# Patient Record
Sex: Female | Born: 1945 | Marital: Married | Smoking: Former smoker
Health system: Southern US, Community
[De-identification: ages and names within clinical notes are randomized; demographics above are authoritative.]

## PROBLEM LIST (undated history)

## (undated) DIAGNOSIS — K219 Gastro-esophageal reflux disease without esophagitis: Secondary | ICD-10-CM

## (undated) DIAGNOSIS — G629 Polyneuropathy, unspecified: Secondary | ICD-10-CM

## (undated) DIAGNOSIS — T8859XA Other complications of anesthesia, initial encounter: Secondary | ICD-10-CM

## (undated) DIAGNOSIS — M419 Scoliosis, unspecified: Secondary | ICD-10-CM

## (undated) DIAGNOSIS — T4145XA Adverse effect of unspecified anesthetic, initial encounter: Secondary | ICD-10-CM

## (undated) DIAGNOSIS — T7840XA Allergy, unspecified, initial encounter: Secondary | ICD-10-CM

## (undated) DIAGNOSIS — R519 Headache, unspecified: Secondary | ICD-10-CM

## (undated) DIAGNOSIS — Z8719 Personal history of other diseases of the digestive system: Secondary | ICD-10-CM

## (undated) DIAGNOSIS — R238 Other skin changes: Secondary | ICD-10-CM

## (undated) DIAGNOSIS — N183 Chronic kidney disease, stage 3 unspecified: Secondary | ICD-10-CM

## (undated) DIAGNOSIS — K449 Diaphragmatic hernia without obstruction or gangrene: Secondary | ICD-10-CM

## (undated) DIAGNOSIS — R112 Nausea with vomiting, unspecified: Secondary | ICD-10-CM

## (undated) DIAGNOSIS — M069 Rheumatoid arthritis, unspecified: Secondary | ICD-10-CM

## (undated) DIAGNOSIS — K8689 Other specified diseases of pancreas: Secondary | ICD-10-CM

## (undated) DIAGNOSIS — K5090 Crohn's disease, unspecified, without complications: Secondary | ICD-10-CM

## (undated) DIAGNOSIS — D649 Anemia, unspecified: Secondary | ICD-10-CM

## (undated) DIAGNOSIS — R233 Spontaneous ecchymoses: Secondary | ICD-10-CM

## (undated) DIAGNOSIS — I10 Essential (primary) hypertension: Secondary | ICD-10-CM

## (undated) DIAGNOSIS — C44621 Squamous cell carcinoma of skin of unspecified upper limb, including shoulder: Secondary | ICD-10-CM

## (undated) DIAGNOSIS — Z9889 Other specified postprocedural states: Secondary | ICD-10-CM

## (undated) DIAGNOSIS — A3791 Whooping cough, unspecified species with pneumonia: Secondary | ICD-10-CM

## (undated) DIAGNOSIS — M1990 Unspecified osteoarthritis, unspecified site: Secondary | ICD-10-CM

## (undated) DIAGNOSIS — J342 Deviated nasal septum: Secondary | ICD-10-CM

## (undated) HISTORY — PX: COLONOSCOPY: SHX174

## (undated) HISTORY — PX: OTHER SURGICAL HISTORY: SHX169

## (undated) HISTORY — PX: UPPER GASTROINTESTINAL ENDOSCOPY: SHX188

## (undated) HISTORY — PX: TONSILLECTOMY: SUR1361

## (undated) HISTORY — PX: NASAL FRACTURE SURGERY: SHX718

## (undated) HISTORY — PX: HERNIA REPAIR: SHX51

## (undated) HISTORY — DX: Chronic kidney disease, stage 3 unspecified: N18.30

---
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
## 2013-01-07 DIAGNOSIS — A3790 Whooping cough, unspecified species without pneumonia: Secondary | ICD-10-CM

## 2013-01-07 HISTORY — DX: Whooping cough, unspecified species without pneumonia: A37.90

## 2016-01-08 HISTORY — PX: CATARACT EXTRACTION, BILATERAL: SHX1313

## 2018-02-11 ENCOUNTER — Encounter: Payer: Self-pay | Admitting: Internal Medicine

## 2018-04-08 NOTE — H&P (Signed)
TOTAL KNEE ADMISSION H&P  Patient is being admitted for right total knee arthroplasty.  Subjective:  Chief Complaint:  Right knee primary OA / pain  HPI: [employer], 73 y.o. female, has a history of pain and functional disability in the right knee due to arthritis and has failed non-surgical conservative treatments for greater than 12 weeks to include NSAID's and/or analgesics, corticosteriod injections, viscosupplementation injections and activity modification.  Onset of symptoms was gradual, starting >10 years ago with gradually worsening course since that time. The patient noted no past surgery on the right knee(s).  Patient currently rates pain in the right knee(s) at 10 out of 10 with activity. Patient has night pain, worsening of pain with activity and weight bearing, pain that interferes with activities of daily living, pain with passive range of motion, crepitus and joint swelling.  Patient has evidence of periarticular osteophytes and joint space narrowing by imaging studies.  There is no active infection.  Risks, benefits and expectations were discussed with the patient.  Risks including but not limited to the risk of anesthesia, blood clots, nerve damage, blood vessel damage, failure of the prosthesis, infection and up to and including death.  Patient understand the risks, benefits and expectations and wishes to proceed with surgery.   PCP: Patient, No Pcp Per  D/C Plans:       Home (daughter's house)  Post-op Meds:       No Rx given  Tranexamic Acid:      To be given - IV   Decadron:      Is to be given  FYI:     Xarelto (unable to use ASA, Crohns)  APAP with prn Dilaudid (trial) (tramadol and codeine cause sign N&V)   DME:   Pt already has equipment  PT:   OPPT   [facility] [place]  There are no active problems to display for this patient.  No past medical history on file.    No current facility-administered medications for this encounter.    Current  Outpatient Medications  Medication Sig Dispense Refill Last Dose  . acetaminophen (TYLENOL) 650 MG CR tablet Take 650 mg by mouth every 8 (eight) hours as needed for pain.     Marland Kitchen amoxicillin (AMOXIL) 500 MG capsule Take 500 mg by mouth 2 (two) times daily as needed (Chrone's).     . calcium carbonate (OSCAL) 1500 (600 Ca) MG TABS tablet Take 600 mg of elemental calcium by mouth 2 (two) times daily with a meal.     . cetirizine (ZYRTEC) 10 MG tablet Take 10 mg by mouth daily.     . Cholecalciferol (VITAMIN D) 50 MCG (2000 UT) CAPS Take 2,000 Units by mouth daily.     Marland Kitchen dicyclomine (BENTYL) 20 MG tablet Take 20 mg by mouth 4 (four) times daily as needed for spasms.     . DULoxetine (CYMBALTA) 60 MG capsule Take 60 mg by mouth 2 (two) times daily.     . hydroxychloroquine (PLAQUENIL) 200 MG tablet Take 400 mg by mouth daily.     Marland Kitchen losartan (COZAAR) 50 MG tablet Take 50 mg by mouth daily.     . mesalamine (LIALDA) 1.2 g EC tablet Take 4.8 g by mouth daily with breakfast.     . Multiple Vitamin (MULTIVITAMIN WITH MINERALS) TABS tablet Take 1 tablet by mouth daily.     Marland Kitchen omeprazole (PRILOSEC) 40 MG capsule Take 40 mg by mouth 2 (two) times daily.     Marland Kitchen  rOPINIRole (REQUIP) 2 MG tablet Take 4 mg by mouth at bedtime.      Allergies  Allergen Reactions  . Aspirin     Intestinal Bleeding  . Codeine Nausea And Vomiting  . Latex     Redness and skin peeling  . Septra [Sulfamethoxazole-Trimethoprim] Nausea Only    Social History   Tobacco Use  . Smoking status: Not on file  Substance Use Topics  . Alcohol use: Not on file       Review of Systems  Constitutional: Negative.   HENT: Positive for hearing loss.   Eyes: Negative.   Respiratory: Negative.   Cardiovascular: Negative.   Gastrointestinal: Positive for heartburn.  Genitourinary: Negative.   Musculoskeletal: Positive for joint pain.  Skin: Negative.   Neurological: Negative.   Endo/Heme/Allergies: Positive for environmental  allergies.  Psychiatric/Behavioral: Negative.     Objective:  Physical Exam  Constitutional: She is oriented to person, place, and time. She appears well-developed.  HENT:  Head: Normocephalic.  Eyes: Pupils are equal, round, and reactive to light.  Neck: Neck supple. No JVD present. No tracheal deviation present. No thyromegaly present.  Cardiovascular: Normal rate, regular rhythm and intact distal pulses.  Respiratory: Effort normal and breath sounds normal. No respiratory distress. She has no wheezes.  GI: Soft. There is no abdominal tenderness. There is no guarding.  Musculoskeletal:     Right knee: She exhibits decreased range of motion, swelling, abnormal alignment and bony tenderness. She exhibits no ecchymosis, no deformity, no laceration and no erythema. Tenderness found.  Lymphadenopathy:    She has no cervical adenopathy.  Neurological: She is alert and oriented to person, place, and time.  Skin: Skin is warm and dry.  Psychiatric: She has a normal mood and affect.      Imaging Review Plain radiographs demonstrate severe degenerative joint disease of the right knee(s). The overall alignment issignificant valgus. The bone quality appears to be good for age and reported activity level.      Assessment/Plan:  End stage arthritis, right knee   The patient history, physical examination, clinical judgment of the provider and imaging studies are consistent with end stage degenerative joint disease of the right knee(s) and total knee arthroplasty is deemed medically necessary. The treatment options including medical management, injection therapy arthroscopy and arthroplasty were discussed at length. The risks and benefits of total knee arthroplasty were presented and reviewed. The risks due to aseptic loosening, infection, stiffness, patella tracking problems, thromboembolic complications and other imponderables were discussed. The patient acknowledged the explanation, agreed  to proceed with the plan and consent was signed. Patient is being admitted for inpatient treatment for surgery, pain control, PT, OT, prophylactic antibiotics, VTE prophylaxis, progressive ambulation and ADL's and discharge planning. The patient is planning to be discharged home.     Patient's anticipated LOS is less than 2 midnights, meeting these requirements: - Lives within 1 hour of care - Has a competent adult at home to recover with post-op recover - NO history of  - Chronic pain requiring opiods  - Diabetes  - Coronary Artery Disease  - Heart failure  - Heart attack  - Stroke  - DVT/VTE  - Cardiac arrhythmia  - Respiratory Failure/COPD  - Renal failure  - Anemia  - Advanced Liver disease    West Pugh. Mardelle Pandolfi   PA-C  04/13/2018, 9:06 PM

## 2018-04-23 ENCOUNTER — Encounter (HOSPITAL_COMMUNITY): Payer: Medicare Other

## 2018-04-28 ENCOUNTER — Encounter (HOSPITAL_COMMUNITY): Admission: RE | Payer: Self-pay | Source: Home / Self Care

## 2018-04-28 ENCOUNTER — Inpatient Hospital Stay (HOSPITAL_COMMUNITY): Admission: RE | Admit: 2018-04-28 | Payer: Medicare Other | Source: Home / Self Care | Admitting: Orthopedic Surgery

## 2018-04-28 SURGERY — ARTHROPLASTY, KNEE, TOTAL
Anesthesia: Spinal | Laterality: Right

## 2018-06-10 NOTE — H&P (Signed)
TOTAL KNEE ADMISSION H&P  Patient is being admitted for right total knee arthroplasty.  Subjective:  Chief Complaint:  Right knee primary OA / pain  HPI: [employer], 73 y.o. female, has a history of pain and functional disability in the right knee due to arthritis and has failed non-surgical conservative treatments for greater than 12 weeks to includeNSAID's and/or analgesics, corticosteriod injections, viscosupplementation injections and activity modification.  Onset of symptoms was gradual, starting >10 years ago with gradually worsening course since that time. The patient noted no past surgery on the right knee(s).  Patient currently rates pain in the right knee(s) at 7 out of 10 with activity. Patient has night pain, worsening of pain with activity and weight bearing, pain that interferes with activities of daily living, pain with passive range of motion, crepitus and joint swelling.  Patient has evidence of periarticular osteophytes and joint space narrowing by imaging studies. There is no active infection.  Risks, benefits and expectations were discussed with the patient.  Risks including but not limited to the risk of anesthesia, blood clots, nerve damage, blood vessel damage, failure of the prosthesis, infection and up to and including death.  Patient understand the risks, benefits and expectations and wishes to proceed with surgery.   PCP: Patient, No Pcp Per  D/C Plans:       Home (daughter's house)  Post-op Meds:       No Rx given  Tranexamic Acid:      To be given - IV   Decadron:      Is to be given  FYI:      Xarelto (unable to tolerate ASA due to Crohn's dz)  Norco  DME:   Rx given for - RW & 3-n-1  PT:   OPPT   [facility]  --  [address]   Past Medical History:  Diagnosis Date  . Allergies   . Asthma   . Bruises easily   . Crohn's disease ([place])    GI Dr Beulah Gandy in [place], [place]   . Deviated septum   . GERD (gastroesophageal reflux disease)    . Headache    occ   . Hiatal hernia   . HTN (hypertension)    pcp Dr Margaretann Loveless  in [place], [place]   . Neuropathy    fingers,periodically   . Osteoarthritis   . Pancreatic insufficiency   . Rheumatoid arthritis ([place])   . Scoliosis   . Squamous cell carcinoma of arm    left   . Whooping cough with pneumonia    2017    Past Surgical History:  Procedure Laterality Date  . CATARACT EXTRACTION, BILATERAL  2018   with IOC implant   . COLONOSCOPY    . knee fracture     . NASAL FRACTURE SURGERY     needed b/c she was getting frequent sinus infections   . UPPER GASTROINTESTINAL ENDOSCOPY      No current facility-administered medications for this encounter.    Current Outpatient Medications  Medication Sig Dispense Refill Last Dose  . acetaminophen (TYLENOL) 650 MG CR tablet Take 1,300 mg by mouth every 8 (eight) hours as needed for pain.      Marland Kitchen amoxicillin (AMOXIL) 500 MG capsule Take 500 mg by mouth 2 (two) times daily as needed (Chrone's flare up).      . calcium carbonate (OSCAL) 1500 (600 Ca) MG TABS tablet Take 600 mg of elemental calcium by mouth 2 (two) times daily  with a meal.     . cetirizine (ZYRTEC) 10 MG tablet Take 10 mg by mouth daily.     . Cholecalciferol (VITAMIN D) 50 MCG (2000 UT) CAPS Take 2,000 Units by mouth daily.     . diclofenac sodium (VOLTAREN) 1 % GEL Apply 1 application topically 2 (two) times a day.     . dicyclomine (BENTYL) 20 MG tablet Take 20 mg by mouth 4 (four) times daily as needed for spasms.     . DULoxetine (CYMBALTA) 60 MG capsule Take 60 mg by mouth 2 (two) times daily.     . hydroxychloroquine (PLAQUENIL) 200 MG tablet Take 400 mg by mouth daily.     Marland Kitchen loperamide (IMODIUM) 2 MG capsule Take 2 mg by mouth 3 (three) times daily as needed for diarrhea or loose stools.     Marland Kitchen losartan (COZAAR) 50 MG tablet Take 50 mg by mouth daily.     . mesalamine (LIALDA) 1.2 g EC tablet Take 4.8 g by mouth daily with breakfast.     . Multiple  Vitamin (MULTIVITAMIN WITH MINERALS) TABS tablet Take 1 tablet by mouth daily.     Marland Kitchen omeprazole (PRILOSEC) 40 MG capsule Take 40 mg by mouth 2 (two) times daily.     . ondansetron (ZOFRAN) 4 MG tablet Take 4 mg by mouth every 8 (eight) hours as needed for nausea or vomiting.     Marland Kitchen rOPINIRole (REQUIP) 2 MG tablet Take 4 mg by mouth at bedtime.     . lipase/protease/amylase (CREON) 12000 units CPEP capsule Take 24,000 Units by mouth 3 (three) times daily with meals.      Allergies  Allergen Reactions  . Aspirin     Intestinal Bleeding  . Codeine Nausea And Vomiting  . Demerol [Meperidine Hcl] Nausea And Vomiting  . Lactose Intolerance (Gi) Other (See Comments)    Pt has Crohn's   . Latex     Redness and skin peeling  . Septra [Sulfamethoxazole-Trimethoprim] Nausea Only    Social History   Tobacco Use  . Smoking status: Former Smoker    Years: 30.00    Types: Cigarettes    Quit date: 1990    Years since quitting: 30.5  . Smokeless tobacco: Never Used  Substance Use Topics  . Alcohol use: Not Currently       Review of Systems  Constitutional: Negative.   HENT: Negative.   Eyes: Negative.   Respiratory: Negative.   Cardiovascular: Negative.   Gastrointestinal: Positive for abdominal pain (CROHN'S) and heartburn.  Genitourinary: Negative.   Musculoskeletal: Positive for joint pain.  Skin: Negative.   Neurological: Positive for headaches.  Endo/Heme/Allergies: Positive for environmental allergies.  Psychiatric/Behavioral: Negative.     Objective:  Physical Exam  Constitutional: She is oriented to person, place, and time. She appears well-developed.  HENT:  Head: Normocephalic.  Eyes: Pupils are equal, round, and reactive to light.  Neck: Neck supple. No JVD present. No tracheal deviation present. No thyromegaly present.  Cardiovascular: Normal rate, regular rhythm and intact distal pulses.  Murmur heard. Respiratory: Effort normal and breath sounds normal. No  respiratory distress. She has no wheezes.  GI: Soft. There is no abdominal tenderness. There is no guarding.  Musculoskeletal:     Right knee: She exhibits decreased range of motion, swelling and bony tenderness. She exhibits no ecchymosis, no deformity, no laceration and no erythema. Tenderness found.  Lymphadenopathy:    She has no cervical adenopathy.  Neurological: She is alert and  oriented to person, place, and time.  Skin: Skin is warm and dry.  Psychiatric: She has a normal mood and affect.       Imaging Review Plain radiographs demonstrate severe degenerative joint disease of the right knee(s). The overall alignment is valgus. The bone quality appears to be good for age and reported activity level.      Assessment/Plan:  End stage arthritis, right knee   The patient history, physical examination, clinical judgment of the provider and imaging studies are consistent with end stage degenerative joint disease of the right knee(s) and total knee arthroplasty is deemed medically necessary. The treatment options including medical management, injection therapy arthroscopy and arthroplasty were discussed at length. The risks and benefits of total knee arthroplasty were presented and reviewed. The risks due to aseptic loosening, infection, stiffness, patella tracking problems, thromboembolic complications and other imponderables were discussed. The patient acknowledged the explanation, agreed to proceed with the plan and consent was signed. Patient is being admitted for inpatient treatment for surgery, pain control, PT, OT, prophylactic antibiotics, VTE prophylaxis, progressive ambulation and ADL's and discharge planning. The patient is planning to be discharged home.     Patient's anticipated LOS is less than 2 midnights, meeting these requirements: - Lives within 1 hour of care - Has a competent adult at home to recover with post-op recover - NO history of  - Chronic pain requiring  opiods  - Diabetes  - Coronary Artery Disease  - Heart failure  - Heart attack  - Stroke  - DVT/VTE  - Cardiac arrhythmia  - Respiratory Failure/COPD  - Renal failure  - Anemia  - Advanced Liver disease          West Pugh. Querida Beretta   PA-C  07/27/2018, 10:16 AM

## 2018-07-03 ENCOUNTER — Encounter (HOSPITAL_COMMUNITY): Admission: RE | Admit: 2018-07-03 | Payer: Medicare Other | Source: Ambulatory Visit

## 2018-07-22 ENCOUNTER — Other Ambulatory Visit (HOSPITAL_COMMUNITY): Payer: Self-pay | Admitting: *Deleted

## 2018-07-22 NOTE — Patient Instructions (Addendum)
YOU NEED TO HAVE A COVID 19 TEST ON_______ Friday, July 17______, THIS TEST MUST BE DONE BEFORE SURGERY, COME TO [place] ENTRANCE. ONCE YOUR COVID TEST IS COMPLETED, PLEASE BEGIN THE QUARANTINE INSTRUCTIONS AS OUTLINED IN YOUR HANDOUT.                [name]    Your procedure is scheduled on: 07-28-2018  Report to [facility] Main  Entrance    Report to admitting at 1025 AM      Call this number if you have problems the morning of surgery [phone]    Remember: [place], NO [place].   Do not eat food After Midnight. YOU MAY HAVE CLEAR LIQUIDS FROM MIDNIGHT UNTIL 9:55AM. At 9:55AM Please finish the prescribed Pre-Surgery ENSURE drink. Nothing by mouth after you finish the ENSURE drink !    CLEAR LIQUID DIET   Foods Allowed                                                                     Foods Excluded  Coffee and tea, regular and decaf                             liquids that you cannot  Plain Jell-O any favor except red or purple                                           see through such as: Fruit ices (not with fruit pulp)                                     milk, soups, orange juice  Iced Popsicles                                    All solid food Carbonated beverages, regular and diet                                    Cranberry, grape and apple juices Sports drinks like Gatorade Lightly seasoned clear broth or consume(fat free) Sugar, honey syrup  Sample Menu Breakfast                                Lunch                                     Supper Cranberry juice                    Beef broth  Chicken broth Jell-O                                     Grape juice                           Apple juice Coffee or tea                        Jell-O                                      Popsicle        Coffee or tea                        Coffee or tea  _____________________________________________________________________     Take these medicines the morning of surgery with A SIP OF WATER: DULOXETINE, HYDROXYCHLOROQUINE, CETRIZINE, TYLENOL IF NEEDED                                You may not have any metal on your body including hair pins and              piercings  Do not wear jewelry, make-up, lotions, powders or perfumes, deodorant             Do not wear nail polish.  Do not shave  48 hours prior to surgery.                 Do not bring valuables to the hospital. [place].  Contacts, dentures or bridgework may not be worn into surgery.  Leave suitcase in the car. After surgery it may be brought to your room.     _____________________________________________________________________             [facility] - Preparing for Surgery Before surgery, you can play an important role.  Because skin is not sterile, your skin needs to be as free of germs as possible.  You can reduce the number of germs on your skin by washing with CHG (chlorahexidine gluconate) soap before surgery.  CHG is an antiseptic cleaner which kills germs and bonds with the skin to continue killing germs even after washing. Please DO NOT use if you have an allergy to CHG or antibacterial soaps.  If your skin becomes reddened/irritated stop using the CHG and inform your nurse when you arrive at Short Stay. Do not shave (including legs and underarms) for at least 48 hours prior to the first CHG shower.  You may shave your face/neck. Please follow these instructions carefully:  1.  Shower with CHG Soap the night before surgery and the  morning of Surgery.  2.  If you choose to wash your hair, wash your hair first as usual with your  normal  shampoo.  3.  After you shampoo, rinse your hair and body thoroughly to remove the  shampoo.                           4.  Use  CHG as  you would any other liquid soap.  You can apply chg directly  to the skin and wash                       Gently with a scrungie or clean washcloth.  5.  Apply the CHG Soap to your body ONLY FROM THE NECK DOWN.   Do not use on face/ open                           Wound or open sores. Avoid contact with eyes, ears mouth and genitals (private parts).                       Wash face,  Genitals (private parts) with your normal soap.             6.  Wash thoroughly, paying special attention to the area where your surgery  will be performed.  7.  Thoroughly rinse your body with warm water from the neck down.  8.  DO NOT shower/wash with your normal soap after using and rinsing off  the CHG Soap.                9.  Pat yourself dry with a clean towel.            10.  Wear clean pajamas.            11.  Place clean sheets on your bed the night of your first shower and do not  sleep with pets. Day of Surgery : Do not apply any lotions/deodorants the morning of surgery.  Please wear clean clothes to the hospital/surgery center.  FAILURE TO FOLLOW THESE INSTRUCTIONS MAY RESULT IN THE CANCELLATION OF YOUR SURGERY PATIENT SIGNATURE_________________________________  NURSE SIGNATURE__________________________________  ________________________________________________________________________   [name]  An incentive spirometer is a tool that can help keep your lungs clear and active. This tool measures how well you are filling your lungs with each breath. Taking long deep breaths may help reverse or decrease the chance of developing breathing (pulmonary) problems (especially infection) following:  A long period of time when you are unable to move or be active. BEFORE THE PROCEDURE   If the spirometer includes an indicator to show your best effort, your nurse or respiratory therapist will set it to a desired goal.  If possible, sit up straight or lean slightly forward. Try not to  slouch.  Hold the incentive spirometer in an upright position. INSTRUCTIONS FOR USE  1. Sit on the edge of your bed if possible, or sit up as far as you can in bed or on a chair. 2. Hold the incentive spirometer in an upright position. 3. Breathe out normally. 4. Place the mouthpiece in your mouth and seal your lips tightly around it. 5. Breathe in slowly and as deeply as possible, raising the piston or the ball toward the top of the column. 6. Hold your breath for 3-5 seconds or for as long as possible. Allow the piston or ball to fall to the bottom of the column. 7. Remove the mouthpiece from your mouth and breathe out normally. 8. Rest for a few seconds and repeat Steps 1 through 7 at least 10 times every 1-2 hours when you are awake. Take your time and take a few normal breaths between deep breaths. 9. The spirometer may include an indicator to show your best effort. Use the  indicator as a goal to work toward during each repetition. 10. After each set of 10 deep breaths, practice coughing to be sure your lungs are clear. If you have an incision (the cut made at the time of surgery), support your incision when coughing by placing a pillow or rolled up towels firmly against it. Once you are able to get out of bed, walk around indoors and cough well. You may stop using the incentive spirometer when instructed by your caregiver.  RISKS AND COMPLICATIONS  Take your time so you do not get dizzy or light-headed.  If you are in pain, you may need to take or ask for pain medication before doing incentive spirometry. It is harder to take a deep breath if you are having pain. AFTER USE  Rest and breathe slowly and easily.  It can be helpful to keep track of a log of your progress. Your caregiver can provide you with a simple table to help with this. If you are using the spirometer at home, follow these instructions: [place] IF:   You are having difficultly using the spirometer.  You  have trouble using the spirometer as often as instructed.  Your pain medication is not giving enough relief while using the spirometer.  You develop fever of 100.5 F (38.1 C) or higher. SEEK IMMEDIATE MEDICAL CARE IF:   You cough up bloody sputum that had not been present before.  You develop fever of 102 F (38.9 C) or greater.  You develop worsening pain at or near the incision site. MAKE SURE YOU:   Understand these instructions.  Will watch your condition.  Will get help right away if you are not doing well or get worse. Document Released: 05/06/2006 Document Revised: 03/18/2011 Document Reviewed: 07/07/2006 [employer] Patient Information 2014 [employer], [place].   ________________________________________________________________________  WHAT IS A BLOOD TRANSFUSION? Blood Transfusion Information  A transfusion is the replacement of blood or some of its parts. Blood is made up of multiple cells which provide different functions.  Red blood cells carry oxygen and are used for blood loss replacement.  White blood cells fight against infection.  Platelets control bleeding.  Plasma helps clot blood.  Other blood products are available for specialized needs, such as hemophilia or other clotting disorders. BEFORE THE TRANSFUSION  Who gives blood for transfusions?   Healthy volunteers who are fully evaluated to make sure their blood is safe. This is blood bank blood. Transfusion therapy is the safest it has ever been in the practice of medicine. Before blood is taken from a donor, a complete history is taken to make sure that person has no history of diseases nor engages in risky social behavior (examples are intravenous drug use or sexual activity with multiple partners). The donor's travel history is screened to minimize risk of transmitting infections, such as malaria. The donated blood is tested for signs of infectious diseases, such as HIV and hepatitis. The blood is then  tested to be sure it is compatible with you in order to minimize the chance of a transfusion reaction. If you or a relative donates blood, this is often done in anticipation of surgery and is not appropriate for emergency situations. It takes many days to process the donated blood. RISKS AND COMPLICATIONS Although transfusion therapy is very safe and saves many lives, the main dangers of transfusion include:   Getting an infectious disease.  Developing a transfusion reaction. This is an allergic reaction to something in the blood you  were given. Every precaution is taken to prevent this. The decision to have a blood transfusion has been considered carefully by your caregiver before blood is given. Blood is not given unless the benefits outweigh the risks. AFTER THE TRANSFUSION  Right after receiving a blood transfusion, you will usually feel much better and more energetic. This is especially true if your red blood cells have gotten low (anemic). The transfusion raises the level of the red blood cells which carry oxygen, and this usually causes an energy increase.  The nurse administering the transfusion will monitor you carefully for complications. HOME CARE INSTRUCTIONS  No special instructions are needed after a transfusion. You may find your energy is better. Speak with your caregiver about any limitations on activity for underlying diseases you may have. SEEK MEDICAL CARE IF:   Your condition is not improving after your transfusion.  You develop redness or irritation at the intravenous (IV) site. SEEK IMMEDIATE MEDICAL CARE IF:  Any of the following symptoms occur over the next 12 hours:  Shaking chills.  You have a temperature by mouth above 102 F (38.9 C), not controlled by medicine.  Chest, back, or muscle pain.  People around you feel you are not acting correctly or are confused.  Shortness of breath or difficulty breathing.  Dizziness and fainting.  You get a rash or  develop hives.  You have a decrease in urine output.  Your urine turns a dark color or changes to pink, red, or brown. Any of the following symptoms occur over the next 10 days:  You have a temperature by mouth above 102 F (38.9 C), not controlled by medicine.  Shortness of breath.  Weakness after normal activity.  The white part of the eye turns yellow (jaundice).  You have a decrease in the amount of urine or are urinating less often.  Your urine turns a dark color or changes to pink, red, or brown. Document Released: 12/22/1999 Document Revised: 03/18/2011 Document Reviewed: 08/10/2007 [facility] Patient Information 2014 [place], [place].  _______________________________________________________________________

## 2018-07-23 ENCOUNTER — Other Ambulatory Visit: Payer: Self-pay

## 2018-07-23 ENCOUNTER — Encounter (HOSPITAL_COMMUNITY)
Admission: RE | Admit: 2018-07-23 | Discharge: 2018-07-23 | Disposition: A | Discharge status: Home / Self Care | Payer: Medicare Other | Source: Ambulatory Visit | Attending: Orthopedic Surgery | Admitting: Orthopedic Surgery

## 2018-07-23 ENCOUNTER — Encounter (HOSPITAL_COMMUNITY): Payer: Self-pay

## 2018-07-23 ENCOUNTER — Encounter (INDEPENDENT_AMBULATORY_CARE_PROVIDER_SITE_OTHER): Payer: Self-pay

## 2018-07-23 DIAGNOSIS — Z01818 Encounter for other preprocedural examination: Secondary | ICD-10-CM | POA: Diagnosis not present

## 2018-07-23 DIAGNOSIS — Z1159 Encounter for screening for other viral diseases: Secondary | ICD-10-CM | POA: Diagnosis not present

## 2018-07-23 HISTORY — DX: Crohn's disease, unspecified, without complications: K50.90

## 2018-07-23 HISTORY — DX: Polyneuropathy, unspecified: G62.9

## 2018-07-23 HISTORY — DX: Deviated nasal septum: J34.2

## 2018-07-23 HISTORY — DX: Headache, unspecified: R51.9

## 2018-07-23 HISTORY — DX: Other specified diseases of pancreas: K86.89

## 2018-07-23 HISTORY — DX: Unspecified osteoarthritis, unspecified site: M19.90

## 2018-07-23 HISTORY — DX: Other skin changes: R23.8

## 2018-07-23 HISTORY — DX: Squamous cell carcinoma of skin of unspecified upper limb, including shoulder: C44.621

## 2018-07-23 HISTORY — DX: Spontaneous ecchymoses: R23.3

## 2018-07-23 HISTORY — DX: Rheumatoid arthritis, unspecified: M06.9

## 2018-07-23 HISTORY — DX: Whooping cough, unspecified species with pneumonia: A37.91

## 2018-07-23 HISTORY — DX: Essential (primary) hypertension: I10

## 2018-07-23 HISTORY — DX: Scoliosis, unspecified: M41.9

## 2018-07-23 HISTORY — DX: Gastro-esophageal reflux disease without esophagitis: K21.9

## 2018-07-23 HISTORY — DX: Allergy, unspecified, initial encounter: T78.40XA

## 2018-07-23 HISTORY — DX: Diaphragmatic hernia without obstruction or gangrene: K44.9

## 2018-07-23 LAB — CBC
HCT: 39 % (ref 36.0–46.0)
Hemoglobin: 12.6 g/dL (ref 12.0–15.0)
MCH: 30.5 pg (ref 26.0–34.0)
MCHC: 32.3 g/dL (ref 30.0–36.0)
MCV: 94.4 fL (ref 80.0–100.0)
Platelets: 359 10*3/uL (ref 150–400)
RBC: 4.13 MIL/uL (ref 3.87–5.11)
RDW: 14.7 % (ref 11.5–15.5)
WBC: 7.8 10*3/uL (ref 4.0–10.5)
nRBC: 0 % (ref 0.0–0.2)

## 2018-07-23 LAB — BASIC METABOLIC PANEL
Anion gap: 11 (ref 5–15)
BUN: 31 mg/dL — ABNORMAL HIGH (ref 8–23)
CO2: 23 mmol/L (ref 22–32)
Calcium: 9.6 mg/dL (ref 8.9–10.3)
Chloride: 108 mmol/L (ref 98–111)
Creatinine, Ser: 0.82 mg/dL (ref 0.44–1.00)
GFR calc Af Amer: >60 mL/min (ref >60)
GFR calc non Af Amer: >60 mL/min (ref >60)
Glucose, Bld: 99 mg/dL (ref 70–99)
Potassium: 4.6 mmol/L (ref 3.5–5.1)
Sodium: 142 mmol/L (ref 135–145)

## 2018-07-23 LAB — SURGICAL PCR SCREEN
MRSA, PCR: NEGATIVE
Staphylococcus aureus: NEGATIVE

## 2018-07-23 LAB — ABO/RH: ABO/RH(D): A POS

## 2018-07-24 ENCOUNTER — Other Ambulatory Visit (HOSPITAL_COMMUNITY)
Admission: RE | Admit: 2018-07-24 | Discharge: 2018-07-24 | Disposition: A | Discharge status: Home / Self Care | Payer: Medicare Other | Source: Ambulatory Visit | Attending: Orthopedic Surgery | Admitting: Orthopedic Surgery

## 2018-07-24 DIAGNOSIS — Z01818 Encounter for other preprocedural examination: Secondary | ICD-10-CM | POA: Diagnosis not present

## 2018-07-24 LAB — SARS CORONAVIRUS 2 (TAT 6-24 HRS): SARS Coronavirus 2: NEGATIVE

## 2018-07-28 ENCOUNTER — Observation Stay (HOSPITAL_COMMUNITY)
Admission: RE | Admit: 2018-07-28 | Discharge: 2018-07-29 | Disposition: A | Discharge status: Home / Self Care | Payer: Medicare Other | Source: Other Acute Inpatient Hospital | Attending: Orthopedic Surgery | Admitting: Orthopedic Surgery

## 2018-07-28 ENCOUNTER — Inpatient Hospital Stay (HOSPITAL_COMMUNITY): Payer: Medicare Other | Admitting: Emergency Medicine

## 2018-07-28 ENCOUNTER — Inpatient Hospital Stay (HOSPITAL_COMMUNITY): Payer: Medicare Other | Admitting: Certified Registered Nurse Anesthetist

## 2018-07-28 ENCOUNTER — Other Ambulatory Visit: Payer: Self-pay

## 2018-07-28 ENCOUNTER — Encounter (HOSPITAL_COMMUNITY)
Admission: RE | Disposition: A | Discharge status: Home / Self Care | Payer: Self-pay | Source: Other Acute Inpatient Hospital | Attending: Orthopedic Surgery

## 2018-07-28 ENCOUNTER — Encounter (HOSPITAL_COMMUNITY): Payer: Self-pay | Admitting: Certified Registered Nurse Anesthetist

## 2018-07-28 DIAGNOSIS — Z96651 Presence of right artificial knee joint: Secondary | ICD-10-CM

## 2018-07-28 DIAGNOSIS — K5090 Crohn's disease, unspecified, without complications: Secondary | ICD-10-CM | POA: Diagnosis not present

## 2018-07-28 DIAGNOSIS — Z85828 Personal history of other malignant neoplasm of skin: Secondary | ICD-10-CM | POA: Insufficient documentation

## 2018-07-28 DIAGNOSIS — K219 Gastro-esophageal reflux disease without esophagitis: Secondary | ICD-10-CM | POA: Insufficient documentation

## 2018-07-28 DIAGNOSIS — E663 Overweight: Secondary | ICD-10-CM | POA: Diagnosis present

## 2018-07-28 DIAGNOSIS — Z79899 Other long term (current) drug therapy: Secondary | ICD-10-CM | POA: Diagnosis not present

## 2018-07-28 DIAGNOSIS — Z87891 Personal history of nicotine dependence: Secondary | ICD-10-CM | POA: Insufficient documentation

## 2018-07-28 DIAGNOSIS — M069 Rheumatoid arthritis, unspecified: Secondary | ICD-10-CM | POA: Insufficient documentation

## 2018-07-28 DIAGNOSIS — I10 Essential (primary) hypertension: Secondary | ICD-10-CM | POA: Diagnosis not present

## 2018-07-28 DIAGNOSIS — M1711 Unilateral primary osteoarthritis, right knee: Principal | ICD-10-CM | POA: Insufficient documentation

## 2018-07-28 HISTORY — PX: TOTAL KNEE ARTHROPLASTY: SHX125

## 2018-07-28 LAB — TYPE AND SCREEN
ABO/RH(D): A POS
Antibody Screen: NEGATIVE

## 2018-07-28 SURGERY — ARTHROPLASTY, KNEE, TOTAL
Anesthesia: Spinal | Site: Knee | Laterality: Right

## 2018-07-28 MED ORDER — METOCLOPRAMIDE HCL 5 MG/ML IJ SOLN: 5.0000 mg | Freq: Three times a day (TID) | INTRAMUSCULAR | Status: DC | PRN

## 2018-07-28 MED ORDER — SODIUM CHLORIDE 0.9 % IR SOLN
Status: DC | PRN
  Administered 2018-07-28: 1000 mL

## 2018-07-28 MED ORDER — CHLORHEXIDINE GLUCONATE 4 % EX LIQD: 60.0000 mL | Freq: Once | CUTANEOUS | Status: DC

## 2018-07-28 MED ORDER — ACETAMINOPHEN 10 MG/ML IV SOLN
INTRAVENOUS | Status: AC
  Filled 2018-07-28: qty 100

## 2018-07-28 MED ORDER — SODIUM CHLORIDE (PF) 0.9 % IJ SOLN
INTRAMUSCULAR | Status: DC | PRN
  Administered 2018-07-28: 30 mL

## 2018-07-28 MED ORDER — EPHEDRINE SULFATE-NACL 50-0.9 MG/10ML-% IV SOSY
PREFILLED_SYRINGE | INTRAVENOUS | Status: DC | PRN
  Administered 2018-07-28: 10 mg via INTRAVENOUS
  Administered 2018-07-28: 15 mg via INTRAVENOUS
  Administered 2018-07-28: 10 mg via INTRAVENOUS

## 2018-07-28 MED ORDER — BISACODYL 10 MG RE SUPP: 10.0000 mg | Freq: Every day | RECTAL | Status: DC | PRN

## 2018-07-28 MED ORDER — FERROUS SULFATE 325 (65 FE) MG PO TABS
325.0000 mg | ORAL_TABLET | Freq: Two times a day (BID) | ORAL | Status: DC
  Administered 2018-07-29: 325 mg via ORAL
  Filled 2018-07-28: qty 1

## 2018-07-28 MED ORDER — POLYETHYLENE GLYCOL 3350 17 G PO PACK
17.0000 g | PACK | Freq: Two times a day (BID) | ORAL | Status: DC
  Administered 2018-07-28: 17 g via ORAL
  Filled 2018-07-28: qty 1

## 2018-07-28 MED ORDER — HYDROMORPHONE HCL 1 MG/ML IJ SOLN
INTRAMUSCULAR | Status: AC
  Filled 2018-07-28: qty 1

## 2018-07-28 MED ORDER — HYDROCODONE-ACETAMINOPHEN 7.5-325 MG PO TABS
1.0000 | ORAL_TABLET | ORAL | Status: DC | PRN
  Administered 2018-07-29: 1 via ORAL
  Filled 2018-07-28: qty 1

## 2018-07-28 MED ORDER — ONDANSETRON HCL 4 MG PO TABS
4.0000 mg | ORAL_TABLET | Freq: Four times a day (QID) | ORAL | Status: DC | PRN
  Filled 2018-07-28: qty 1

## 2018-07-28 MED ORDER — LOSARTAN POTASSIUM 50 MG PO TABS
50.0000 mg | ORAL_TABLET | Freq: Every day | ORAL | Status: DC
  Administered 2018-07-29: 50 mg via ORAL
  Filled 2018-07-28: qty 1

## 2018-07-28 MED ORDER — METHOCARBAMOL 500 MG IVPB - SIMPLE MED
500.0000 mg | Freq: Four times a day (QID) | INTRAVENOUS | Status: DC | PRN
  Administered 2018-07-28: 500 mg via INTRAVENOUS
  Filled 2018-07-28: qty 50

## 2018-07-28 MED ORDER — PROPOFOL 10 MG/ML IV BOLUS
INTRAVENOUS | Status: AC
  Filled 2018-07-28: qty 60

## 2018-07-28 MED ORDER — METHOCARBAMOL 500 MG PO TABS
500.0000 mg | ORAL_TABLET | Freq: Four times a day (QID) | ORAL | Status: DC | PRN
  Administered 2018-07-28 – 2018-07-29 (×2): 500 mg via ORAL
  Filled 2018-07-28 (×2): qty 1

## 2018-07-28 MED ORDER — KETOROLAC TROMETHAMINE 30 MG/ML IJ SOLN
INTRAMUSCULAR | Status: AC
  Filled 2018-07-28: qty 1

## 2018-07-28 MED ORDER — LORATADINE 10 MG PO TABS
10.0000 mg | ORAL_TABLET | Freq: Every day | ORAL | Status: DC
  Administered 2018-07-29: 10 mg via ORAL
  Filled 2018-07-28: qty 1

## 2018-07-28 MED ORDER — TRANEXAMIC ACID-NACL 1000-0.7 MG/100ML-% IV SOLN
1000.0000 mg | INTRAVENOUS | Status: AC
  Administered 2018-07-28: 1000 mg via INTRAVENOUS
  Filled 2018-07-28: qty 100

## 2018-07-28 MED ORDER — ONDANSETRON HCL 4 MG/2ML IJ SOLN
INTRAMUSCULAR | Status: AC
  Filled 2018-07-28: qty 2

## 2018-07-28 MED ORDER — DIPHENHYDRAMINE HCL 12.5 MG/5ML PO ELIX
12.5000 mg | ORAL_SOLUTION | ORAL | Status: DC | PRN
Start: 2018-07-28 — End: 2018-07-29

## 2018-07-28 MED ORDER — OMEPRAZOLE 20 MG PO CPDR
40.0000 mg | DELAYED_RELEASE_CAPSULE | Freq: Two times a day (BID) | ORAL | Status: DC
  Administered 2018-07-29: 40 mg via ORAL
  Filled 2018-07-28 (×2): qty 2

## 2018-07-28 MED ORDER — STERILE WATER FOR IRRIGATION IR SOLN
Status: DC | PRN
  Administered 2018-07-28 (×2): 1000 mL

## 2018-07-28 MED ORDER — SODIUM CHLORIDE (PF) 0.9 % IJ SOLN
INTRAMUSCULAR | Status: AC
  Filled 2018-07-28: qty 50

## 2018-07-28 MED ORDER — BUPIVACAINE-EPINEPHRINE (PF) 0.25% -1:200000 IJ SOLN
INTRAMUSCULAR | Status: AC
  Filled 2018-07-28: qty 30

## 2018-07-28 MED ORDER — EPHEDRINE 5 MG/ML INJ
INTRAVENOUS | Status: AC
  Filled 2018-07-28: qty 10

## 2018-07-28 MED ORDER — DULOXETINE HCL 60 MG PO CPEP
60.0000 mg | ORAL_CAPSULE | Freq: Two times a day (BID) | ORAL | Status: DC
  Administered 2018-07-28 – 2018-07-29 (×2): 60 mg via ORAL
  Filled 2018-07-28 (×2): qty 1

## 2018-07-28 MED ORDER — HYDROCODONE-ACETAMINOPHEN 5-325 MG PO TABS
1.0000 | ORAL_TABLET | ORAL | Status: DC | PRN
  Administered 2018-07-29: 1 via ORAL
  Filled 2018-07-28: qty 1

## 2018-07-28 MED ORDER — DICYCLOMINE HCL 20 MG PO TABS
20.0000 mg | ORAL_TABLET | Freq: Four times a day (QID) | ORAL | Status: DC | PRN
  Filled 2018-07-28: qty 1

## 2018-07-28 MED ORDER — POVIDONE-IODINE 10 % EX SWAB
2.0000 "application " | Freq: Once | CUTANEOUS | Status: AC
  Administered 2018-07-28: 2 via TOPICAL

## 2018-07-28 MED ORDER — KETOROLAC TROMETHAMINE 30 MG/ML IJ SOLN
INTRAMUSCULAR | Status: DC | PRN
  Administered 2018-07-28: 30 mg

## 2018-07-28 MED ORDER — PROMETHAZINE HCL 25 MG/ML IJ SOLN: 6.2500 mg | INTRAMUSCULAR | Status: DC | PRN

## 2018-07-28 MED ORDER — SODIUM CHLORIDE 0.9 % IV SOLN
INTRAVENOUS | Status: DC
  Administered 2018-07-28: 17:00:00 via INTRAVENOUS

## 2018-07-28 MED ORDER — PHENYLEPHRINE 40 MCG/ML (10ML) SYRINGE FOR IV PUSH (FOR BLOOD PRESSURE SUPPORT)
PREFILLED_SYRINGE | INTRAVENOUS | Status: DC | PRN
  Administered 2018-07-28 (×3): 80 ug via INTRAVENOUS

## 2018-07-28 MED ORDER — CLONIDINE HCL (ANALGESIA) 100 MCG/ML EP SOLN
EPIDURAL | Status: DC | PRN
  Administered 2018-07-28: 70 ug

## 2018-07-28 MED ORDER — MESALAMINE 1.2 G PO TBEC
4.8000 g | DELAYED_RELEASE_TABLET | Freq: Every day | ORAL | Status: DC
Start: 2018-07-29 — End: 2018-07-29
  Filled 2018-07-28: qty 4

## 2018-07-28 MED ORDER — CEFAZOLIN SODIUM-DEXTROSE 2-4 GM/100ML-% IV SOLN
2.0000 g | Freq: Four times a day (QID) | INTRAVENOUS | Status: AC
  Administered 2018-07-28 – 2018-07-29 (×2): 2 g via INTRAVENOUS
  Filled 2018-07-28 (×2): qty 100

## 2018-07-28 MED ORDER — FENTANYL CITRATE (PF) 100 MCG/2ML IJ SOLN
INTRAMUSCULAR | Status: AC
  Filled 2018-07-28: qty 2

## 2018-07-28 MED ORDER — LACTATED RINGERS IV SOLN
INTRAVENOUS | Status: DC
  Administered 2018-07-28 (×2): via INTRAVENOUS

## 2018-07-28 MED ORDER — BUPIVACAINE-EPINEPHRINE (PF) 0.25% -1:200000 IJ SOLN
INTRAMUSCULAR | Status: DC | PRN
  Administered 2018-07-28: 30 mL via PERINEURAL

## 2018-07-28 MED ORDER — ACETAMINOPHEN 325 MG PO TABS
325.0000 mg | ORAL_TABLET | Freq: Four times a day (QID) | ORAL | Status: DC | PRN
  Administered 2018-07-29: 650 mg via ORAL
  Filled 2018-07-28: qty 2

## 2018-07-28 MED ORDER — LIDOCAINE 2% (20 MG/ML) 5 ML SYRINGE
INTRAMUSCULAR | Status: AC
  Filled 2018-07-28: qty 5

## 2018-07-28 MED ORDER — TRANEXAMIC ACID-NACL 1000-0.7 MG/100ML-% IV SOLN
1000.0000 mg | Freq: Once | INTRAVENOUS | Status: AC
  Administered 2018-07-28: 1000 mg via INTRAVENOUS
  Filled 2018-07-28: qty 100

## 2018-07-28 MED ORDER — METOCLOPRAMIDE HCL 5 MG PO TABS
5.0000 mg | ORAL_TABLET | Freq: Three times a day (TID) | ORAL | Status: DC | PRN
  Filled 2018-07-28: qty 2

## 2018-07-28 MED ORDER — ONDANSETRON HCL 4 MG/2ML IJ SOLN
4.0000 mg | Freq: Four times a day (QID) | INTRAMUSCULAR | Status: DC | PRN
  Administered 2018-07-28: 4 mg via INTRAVENOUS

## 2018-07-28 MED ORDER — DEXAMETHASONE SODIUM PHOSPHATE 10 MG/ML IJ SOLN
10.0000 mg | Freq: Once | INTRAMUSCULAR | Status: AC
  Administered 2018-07-29: 10 mg via INTRAVENOUS
  Filled 2018-07-28: qty 1

## 2018-07-28 MED ORDER — ACETAMINOPHEN 10 MG/ML IV SOLN
1000.0000 mg | Freq: Once | INTRAVENOUS | Status: AC
  Administered 2018-07-28: 1000 mg via INTRAVENOUS

## 2018-07-28 MED ORDER — ROPINIROLE HCL 1 MG PO TABS
4.0000 mg | ORAL_TABLET | Freq: Every day | ORAL | Status: DC
  Administered 2018-07-28: 4 mg via ORAL
  Filled 2018-07-28: qty 4

## 2018-07-28 MED ORDER — MAGNESIUM CITRATE PO SOLN: 1.0000 | Freq: Once | ORAL | Status: DC | PRN

## 2018-07-28 MED ORDER — CEFAZOLIN SODIUM-DEXTROSE 2-4 GM/100ML-% IV SOLN
2.0000 g | INTRAVENOUS | Status: AC
  Administered 2018-07-28: 2 g via INTRAVENOUS
  Filled 2018-07-28: qty 100

## 2018-07-28 MED ORDER — DOCUSATE SODIUM 100 MG PO CAPS
100.0000 mg | ORAL_CAPSULE | Freq: Two times a day (BID) | ORAL | Status: DC
  Administered 2018-07-28: 100 mg via ORAL
  Filled 2018-07-28: qty 1

## 2018-07-28 MED ORDER — DEXAMETHASONE SODIUM PHOSPHATE 10 MG/ML IJ SOLN
INTRAMUSCULAR | Status: AC
  Filled 2018-07-28: qty 1

## 2018-07-28 MED ORDER — LIDOCAINE 2% (20 MG/ML) 5 ML SYRINGE
INTRAMUSCULAR | Status: DC | PRN
  Administered 2018-07-28: 100 mg via INTRAVENOUS

## 2018-07-28 MED ORDER — PROPOFOL 10 MG/ML IV BOLUS
INTRAVENOUS | Status: DC | PRN
  Administered 2018-07-28: 100 mg via INTRAVENOUS

## 2018-07-28 MED ORDER — ALUM & MAG HYDROXIDE-SIMETH 200-200-20 MG/5ML PO SUSP: 15.0000 mL | ORAL | Status: DC | PRN

## 2018-07-28 MED ORDER — PANCRELIPASE (LIP-PROT-AMYL) 12000-38000 UNITS PO CPEP: 24000.0000 [IU] | ORAL_CAPSULE | Freq: Three times a day (TID) | ORAL | Status: DC

## 2018-07-28 MED ORDER — AMOXICILLIN 500 MG PO CAPS
500.0000 mg | ORAL_CAPSULE | Freq: Two times a day (BID) | ORAL | Status: DC | PRN
  Filled 2018-07-28: qty 1

## 2018-07-28 MED ORDER — DEXAMETHASONE SODIUM PHOSPHATE 10 MG/ML IJ SOLN: 10.0000 mg | Freq: Once | INTRAMUSCULAR | Status: DC

## 2018-07-28 MED ORDER — MIDAZOLAM HCL 2 MG/2ML IJ SOLN
1.0000 mg | INTRAMUSCULAR | Status: DC
  Administered 2018-07-28: 0.5 mg via INTRAVENOUS
  Filled 2018-07-28: qty 2

## 2018-07-28 MED ORDER — HYDROMORPHONE HCL 1 MG/ML IJ SOLN
0.2500 mg | INTRAMUSCULAR | Status: DC | PRN
  Administered 2018-07-28 (×2): 0.5 mg via INTRAVENOUS

## 2018-07-28 MED ORDER — FENTANYL CITRATE (PF) 100 MCG/2ML IJ SOLN
50.0000 ug | INTRAMUSCULAR | Status: DC
  Administered 2018-07-28: 50 ug via INTRAVENOUS
  Filled 2018-07-28: qty 2

## 2018-07-28 MED ORDER — PHENYLEPHRINE 40 MCG/ML (10ML) SYRINGE FOR IV PUSH (FOR BLOOD PRESSURE SUPPORT)
PREFILLED_SYRINGE | INTRAVENOUS | Status: AC
  Filled 2018-07-28: qty 10

## 2018-07-28 MED ORDER — FENTANYL CITRATE (PF) 100 MCG/2ML IJ SOLN
INTRAMUSCULAR | Status: DC | PRN
  Administered 2018-07-28 (×7): 25 ug via INTRAVENOUS

## 2018-07-28 MED ORDER — PHENOL 1.4 % MT LIQD: 1.0000 | OROMUCOSAL | Status: DC | PRN

## 2018-07-28 MED ORDER — MENTHOL 3 MG MT LOZG: 1.0000 | LOZENGE | OROMUCOSAL | Status: DC | PRN

## 2018-07-28 MED ORDER — RIVAROXABAN 10 MG PO TABS
10.0000 mg | ORAL_TABLET | ORAL | Status: DC
  Administered 2018-07-29: 10 mg via ORAL
  Filled 2018-07-28: qty 1

## 2018-07-28 MED ORDER — HYDROMORPHONE HCL 1 MG/ML IJ SOLN: 0.5000 mg | INTRAMUSCULAR | Status: DC | PRN

## 2018-07-28 MED ORDER — METHOCARBAMOL 500 MG IVPB - SIMPLE MED
INTRAVENOUS | Status: AC
  Filled 2018-07-28: qty 50

## 2018-07-28 MED ORDER — DEXAMETHASONE SODIUM PHOSPHATE 10 MG/ML IJ SOLN
INTRAMUSCULAR | Status: DC | PRN
  Administered 2018-07-28: 10 mg via INTRAVENOUS

## 2018-07-28 MED ORDER — ONDANSETRON HCL 4 MG/2ML IJ SOLN
INTRAMUSCULAR | Status: DC | PRN
  Administered 2018-07-28: 4 mg via INTRAVENOUS

## 2018-07-28 MED ORDER — BUPIVACAINE HCL (PF) 0.5 % IJ SOLN
INTRAMUSCULAR | Status: DC | PRN
  Administered 2018-07-28: 20 mL via PERINEURAL

## 2018-07-28 SURGICAL SUPPLY — 57 items
ATTUNE MED ANAT PAT 35 KNEE (Knees) ×1 IMPLANT
ATTUNE PS FEM RT SZ 3 CEM KNEE (Femur) ×1 IMPLANT
ATTUNE PSRP INSR SZ3 6 KNEE (Insert) ×1 IMPLANT
BAG ZIPLOCK 12X15 (MISCELLANEOUS) IMPLANT
BASEPLATE TIBIAL ROTATING SZ 4 (Knees) ×1 IMPLANT
BLADE SAW SGTL 11.0X1.19X90.0M (BLADE) ×1 IMPLANT
BLADE SAW SGTL 13.0X1.19X90.0M (BLADE) ×2 IMPLANT
BLADE SURG SZ10 CARB STEEL (BLADE) ×4 IMPLANT
BNDG ELASTIC 6X5.8 VLCR STR LF (GAUZE/BANDAGES/DRESSINGS) ×2 IMPLANT
BOWL SMART MIX CTS (DISPOSABLE) ×2 IMPLANT
CEMENT HV SMART SET (Cement) ×2 IMPLANT
COVER SURGICAL LIGHT HANDLE (MISCELLANEOUS) ×2 IMPLANT
COVER WAND RF STERILE (DRAPES) IMPLANT
CUFF TOURN SGL QUICK 34 (TOURNIQUET CUFF) ×1
CUFF TRNQT CYL 34X4.125X (TOURNIQUET CUFF) ×1 IMPLANT
DECANTER SPIKE VIAL GLASS SM (MISCELLANEOUS) ×4 IMPLANT
DERMABOND ADVANCED (GAUZE/BANDAGES/DRESSINGS) ×1
DERMABOND ADVANCED .7 DNX12 (GAUZE/BANDAGES/DRESSINGS) ×1 IMPLANT
DRAPE U-SHAPE 47X51 STRL (DRAPES) ×2 IMPLANT
DRESSING AQUACEL AG SP 3.5X10 (GAUZE/BANDAGES/DRESSINGS) ×1 IMPLANT
DRSG AQUACEL AG SP 3.5X10 (GAUZE/BANDAGES/DRESSINGS) ×2
DURAPREP 26ML APPLICATOR (WOUND CARE) ×4 IMPLANT
ELECT REM PT RETURN 15FT ADLT (MISCELLANEOUS) ×2 IMPLANT
GLOVE BIO SURGEON STRL SZ 6 (GLOVE) ×2 IMPLANT
GLOVE BIOGEL PI IND STRL 6.5 (GLOVE) ×1 IMPLANT
GLOVE BIOGEL PI IND STRL 7.5 (GLOVE) ×1 IMPLANT
GLOVE BIOGEL PI IND STRL 8.5 (GLOVE) ×1 IMPLANT
GLOVE BIOGEL PI INDICATOR 6.5 (GLOVE) ×1
GLOVE BIOGEL PI INDICATOR 7.5 (GLOVE) ×1
GLOVE BIOGEL PI INDICATOR 8.5 (GLOVE) ×1
GLOVE ECLIPSE 8.0 STRL XLNG CF (GLOVE) ×2 IMPLANT
GLOVE ORTHO TXT STRL SZ7.5 (GLOVE) ×2 IMPLANT
GOWN STRL REUS W/ TWL LRG LVL3 (GOWN DISPOSABLE) ×1 IMPLANT
GOWN STRL REUS W/TWL 2XL LVL3 (GOWN DISPOSABLE) ×2 IMPLANT
GOWN STRL REUS W/TWL LRG LVL3 (GOWN DISPOSABLE) ×3 IMPLANT
HANDPIECE INTERPULSE COAX TIP (DISPOSABLE) ×1
HOLDER FOLEY CATH W/STRAP (MISCELLANEOUS) IMPLANT
KIT TURNOVER KIT A (KITS) IMPLANT
MANIFOLD NEPTUNE II (INSTRUMENTS) ×2 IMPLANT
NDL SAFETY ECLIPSE 18X1.5 (NEEDLE) IMPLANT
NEEDLE HYPO 18GX1.5 SHARP (NEEDLE)
NS IRRIG 1000ML POUR BTL (IV SOLUTION) ×2 IMPLANT
PACK TOTAL KNEE CUSTOM (KITS) ×2 IMPLANT
PIN THREADED HEADED SIGMA (PIN) ×1 IMPLANT
PROTECTOR NERVE ULNAR (MISCELLANEOUS) ×2 IMPLANT
SET HNDPC FAN SPRY TIP SCT (DISPOSABLE) ×1 IMPLANT
SET PAD KNEE POSITIONER (MISCELLANEOUS) ×2 IMPLANT
SUT MNCRL AB 4-0 PS2 18 (SUTURE) ×2 IMPLANT
SUT STRATAFIX PDS+ 0 24IN (SUTURE) ×2 IMPLANT
SUT VIC AB 1 CT1 36 (SUTURE) ×2 IMPLANT
SUT VIC AB 2-0 CT1 27 (SUTURE) ×3
SUT VIC AB 2-0 CT1 TAPERPNT 27 (SUTURE) ×3 IMPLANT
SYR 3ML LL SCALE MARK (SYRINGE) ×2 IMPLANT
TRAY FOLEY MTR SLVR 16FR STAT (SET/KITS/TRAYS/PACK) ×2 IMPLANT
WATER STERILE IRR 1000ML POUR (IV SOLUTION) ×4 IMPLANT
WRAP KNEE MAXI GEL POST OP (GAUZE/BANDAGES/DRESSINGS) ×2 IMPLANT
YANKAUER SUCT BULB TIP 10FT TU (MISCELLANEOUS) ×2 IMPLANT

## 2018-07-28 NOTE — Anesthesia Procedure Notes (Signed)
Procedure Name: LMA Insertion Date/Time: 07/28/2018 1:12 PM Performed by: Genelle Bal, CRNA Pre-anesthesia Checklist: Patient identified, Emergency Drugs available, Suction available and Patient being monitored Patient Re-evaluated:Patient Re-evaluated prior to induction Oxygen Delivery Method: Circle system utilized Preoxygenation: Pre-oxygenation with 100% oxygen Induction Type: IV induction Ventilation: Mask ventilation without difficulty LMA: LMA inserted LMA Size: 4.0 Number of attempts: 1 Airway Equipment and Method: Bite block Placement Confirmation: positive ETCO2 Tube secured with: Tape Dental Injury: Teeth and Oropharynx as per pre-operative assessment

## 2018-07-28 NOTE — Interval H&P Note (Signed)
History and Physical Interval Note:  07/28/2018 11:34 AM  [name]  has presented today for surgery, with the diagnosis of Right knee osteoarthritis.  The various methods of treatment have been discussed with the patient and family. After consideration of risks, benefits and other options for treatment, the patient has consented to  Procedure(s): TOTAL KNEE ARTHROPLASTY (Right) as a surgical intervention.  The patient's history has been reviewed, patient examined, no change in status, stable for surgery.  I have reviewed the patient's chart and labs.  Questions were answered to the patient's satisfaction.     Mauri Pole

## 2018-07-28 NOTE — Progress Notes (Signed)
Assisted Dr. Kalman Shan with right, ultrasound guided, adductor canal block. Side rails up, monitors on throughout procedure. See vital signs in flow sheet. Tolerated Procedure well.

## 2018-07-28 NOTE — Anesthesia Procedure Notes (Signed)
Anesthesia Procedure Image    

## 2018-07-28 NOTE — Discharge Instructions (Addendum)

## 2018-07-28 NOTE — Anesthesia Postprocedure Evaluation (Signed)
Anesthesia Post Note  Patient: [name], [name]  Procedure([name]) Performed: TOTAL KNEE ARTHROPLASTY (Right Knee)     Patient location during evaluation: PACU Anesthesia Type: General Level of consciousness: awake and alert Pain management: pain level controlled Vital Signs Assessment: post-procedure vital signs reviewed and stable Respiratory status: spontaneous breathing, nonlabored ventilation, respiratory function stable and patient connected to nasal cannula oxygen Cardiovascular status: blood pressure returned to baseline and stable Postop Assessment: no apparent nausea or vomiting Anesthetic complications: no    Last Vitals:  Vitals:   07/28/18 1605 07/28/18 1615  BP:  (!) 164/94  Pulse: 88 88  Resp: 10 11  Temp:    SpO2: 97% 97%    Last Pain:  Vitals:   07/28/18 1600  TempSrc:   PainSc: 6                  [name]

## 2018-07-28 NOTE — Transfer of Care (Signed)
Immediate Anesthesia Transfer of Care Note  Patient: [name]  Procedure(s) Performed: TOTAL KNEE ARTHROPLASTY (Right Knee)  Patient Location: PACU  Anesthesia Type:GA combined with regional for post-op pain  Level of Consciousness: awake, sedated and patient cooperative  Airway & Oxygen Therapy: Patient Spontanous Breathing and Patient connected to face mask oxygen  Post-op Assessment: Report given to RN and Post -op Vital signs reviewed and stable  Post vital signs: Reviewed and stable  Last Vitals:  Vitals Value Taken Time  BP 181/97 07/28/18 1519  Temp    Pulse 88 07/28/18 1521  Resp 13 07/28/18 1521  SpO2 96 % 07/28/18 1521  Vitals shown include unvalidated device data.  Last Pain:  Vitals:   07/28/18 1102  TempSrc:   PainSc: 6       Patients Stated Pain Goal: 4 (48/62/82 [id number])  Complications: No apparent anesthesia complications

## 2018-07-28 NOTE — Progress Notes (Signed)
PT Cancellation Note  Patient Details Name: [name] MRN: [id number] DOB: 1945-01-28   Cancelled Treatment:    Reason Eval/Treat Not Completed: Fatigue/lethargy limiting ability to participate(pt has been sleeping since she arrived on unit, pt aroused to verbal stimulii but stated she's too tired to attempt any mobility. Will follow.)   Philomena Doheny PT 07/28/2018  Acute Rehabilitation Services Pager [phone] Office [phone]

## 2018-07-28 NOTE — Anesthesia Procedure Notes (Signed)
Anesthesia Regional Block: Adductor canal block   Pre-Anesthetic Checklist: ,, timeout performed, Correct Patient, Correct Site, Correct Laterality, Correct Procedure, Correct Position, site marked, Risks and benefits discussed,  Surgical consent,  Pre-op evaluation,  At surgeon's request and post-op pain management  Laterality: Right  Prep: chloraprep       Needles:  Injection technique: Single-shot  Needle Type: Echogenic Needle     Needle Length: 9cm      Additional Needles:   Procedures:,,,, ultrasound used (permanent image in chart),,,,  Narrative:  Start time: 07/28/2018 11:50 AM End time: 07/28/2018 11:58 AM Injection made incrementally with aspirations every 5 mL.  Performed by: Personally  Anesthesiologist: Myrtie Soman, MD  Additional Notes: Patient tolerated the procedure well without complications

## 2018-07-28 NOTE — Addendum Note (Signed)
Addendum  created 07/28/18 1637 by [place] Pugh, CRNA   Intraprocedure Meds edited

## 2018-07-28 NOTE — Anesthesia Preprocedure Evaluation (Addendum)
Anesthesia Evaluation  Patient identified by MRN, date of birth, ID band Patient awake    Reviewed: Allergy & Precautions, NPO status , Patient's Chart, lab work & pertinent test results  Airway Mallampati: II  TM Distance: >3 FB Neck ROM: Full    Dental no notable dental hx.    Pulmonary neg pulmonary ROS, former smoker,    Pulmonary exam normal breath sounds clear to auscultation       Cardiovascular hypertension, Normal cardiovascular exam Rhythm:Regular Rate:Normal     Neuro/Psych negative neurological ROS  negative psych ROS   GI/Hepatic Neg liver ROS, GERD  ,chrohns dz   Endo/Other  negative endocrine ROS  Renal/GU negative Renal ROS  negative genitourinary   Musculoskeletal  (+) Arthritis , Rheumatoid disorders,  Severe scoliosis   Abdominal   Peds negative pediatric ROS (+)  Hematology negative hematology ROS (+)   Anesthesia Other Findings   Reproductive/Obstetrics negative OB ROS                            Anesthesia Physical Anesthesia Plan  ASA: II  Anesthesia Plan: Spinal   Post-op Pain Management:  Regional for Post-op pain   Induction: Intravenous  PONV Risk Score and Plan: 2 and Ondansetron and Dexamethasone  Airway Management Planned: Simple Face Mask  Additional Equipment:   Intra-op Plan:   Post-operative Plan:   Informed Consent: I have reviewed the patients History and Physical, chart, labs and discussed the procedure including the risks, benefits and alternatives for the proposed anesthesia with the patient or authorized representative who has indicated his/her understanding and acceptance.     Dental advisory given  Plan Discussed with: CRNA and Surgeon  Anesthesia Plan Comments:         Anesthesia Quick Evaluation

## 2018-07-28 NOTE — Op Note (Signed)
NAME:  [facility]                      MEDICAL RECORD NO.:  [id number]                             FACILITY:  [facility]      PHYSICIAN:  Pietro Cassis. Alvan Dame, M.D.  DATE OF BIRTH:  November 13, 1945      DATE OF PROCEDURE:  07/28/2018                                     OPERATIVE REPORT         PREOPERATIVE DIAGNOSIS:  Right knee osteoarthritis.      POSTOPERATIVE DIAGNOSIS:  Right knee osteoarthritis.      FINDINGS:  The patient was noted to have complete loss of cartilage and   bone-on-bone arthritis with severe associated osteophytes in the lateral and patellofemoral compartments of   the knee with a significant synovitis and associated effusion.  The patient had failed months of conservative treatment including medications, injection therapy, activity modification.     PROCEDURE:  Right total knee replacement.      COMPONENTS USED:  DePuy Attune rotating platform posterior stabilized knee   system, a size 3 femur, 4 tibia, size 6 mm PS AOX insert, and 35 anatomic patellar   button.      SURGEON:  Pietro Cassis. Alvan Dame, M.D.      ASSISTANT:  Griffith Citron, PA-C.      ANESTHESIA:  Regional and Spinal.      SPECIMENS:  None.      COMPLICATION:  None.      DRAINS:  None.  EBL: <100cc       TOURNIQUET TIME:  43 min at 242mHg  The patient was stable to the recovery room.      INDICATION FOR PROCEDURE:  BLadawn Boullionis a 73y.o. female patient of   mine.  The patient had been seen, evaluated, and treated for months conservatively in the   office with medication, activity modification, and injections.  The patient had   radiographic changes of bone-on-bone arthritis with endplate sclerosis and osteophytes noted.  Based on the radiographic changes and failed conservative measures, the patient   decided to proceed with definitive treatment, total knee replacement.  Risks of infection, DVT, component failure, need for revision surgery, neurovascular injury were reviewed in the office setting.   The postop course was reviewed stressing the efforts to maximize post-operative satisfaction and function.  Consent was obtained for benefit of pain   relief.      PROCEDURE IN DETAIL:  The patient was brought to the operative theater.   Once adequate anesthesia, preoperative antibiotics, 2 gm of Ancef,1 gm of Tranexamic Acid, and 10 mg of Decadron administered, the patient was positioned supine with a right thigh tourniquet placed.  The  right lower extremity was prepped and draped in sterile fashion.  A time-   out was performed identifying the patient, planned procedure, and the appropriate extremity.      The right lower extremity [facility]  The leg was   exsanguinated, tourniquet elevated to 250 mmHg.  A midline incision was   made followed by median parapatellar arthrotomy.  Following initial   exposure, attention was first directed to the patella.  Precut   measurement was noted to be 22 mm.  I resected down to 13 mm and used a   35 anatomic patellar button to restore patellar height as well as cover the cut surface.      The lug holes were drilled and a metal shim was placed to protect the   patella from retractors and saw blade during the procedure.      At this point, attention was now directed to the femur.  The femoral   canal was opened with a drill, irrigated to try to prevent fat emboli.  An   intramedullary rod was passed at 5 degrees valgus, 11 mm of bone was   resected off the distal femur.  Following this resection, the tibia was   subluxated anteriorly.  Using the extramedullary guide, 2 mm of bone was resected off   the proximal lateral tibia.  We confirmed the gap would be   stable medially and laterally with a size 5 spacer block as well as confirmed that the tibial cut was perpendicular in the coronal plane, checking with an alignment rod.      Once this was done, I sized the femur to be a size 3 in the anterior-   posterior dimension,  chose a standard component based on medial and   lateral dimension.  The size 3 rotation block was then pinned in   position anterior referenced using the C-clamp to set rotation.  The   anterior, posterior, and  chamfer cuts were made without difficulty nor   notching making certain that I was along the anterior cortex to help   with flexion gap stability. Significant osteophytes were debreided     The final box cut was made off the lateral aspect of distal femur.      At this point, the tibia was sized to be a size 4.  The size 4 tray was   then pinned in position through the medial third of the tubercle,   drilled, and keel punched.  Trial reduction was now carried with a 3 femur,  4 tibia, a size 6 mm PS insert, and the 35 anatomic patella botton.  The knee was brought to full extension with good flexion stability with the patella   tracking through the trochlea without application of pressure.  Given   all these findings the trial components removed.  Final components were   opened and cement was mixed.  The knee was irrigated with normal saline solution and pulse lavage.  The synovial lining was   then injected with 30 cc of 0.25% Marcaine with epinephrine, 1 cc of Toradol and 30 cc of NS for a total of 61 cc. Sclerotic bone was drilled to allow for better cement interdigitation.     Final implants were then cemented onto cleaned and dried cut surfaces of bone with the knee brought to extension with a size 6 mm PS trial insert.      Once the cement had fully cured, excess cement was removed   throughout the knee.  I confirmed that I was satisfied with the range of   motion and stability, and the final size 6 mm PS AOX insert was chosen.  It was   placed into the knee.      The tourniquet had been let down at 43 minutes.  No significant   hemostasis was required.  The extensor mechanism was then reapproximated using #1 Vicryl and #1 Stratafix sutures with the knee  in flexion.  The    remaining wound was closed with 2-0 Vicryl and running 4-0 Monocryl.   The knee was cleaned, dried, dressed sterilely using Dermabond and   Aquacel dressing.  The patient was then   brought to recovery room in stable condition, tolerating the procedure   well.   Please note that Physician Assistant, Griffith Citron, PA-C was present for the entirety of the case, and was utilized for pre-operative positioning, peri-operative retractor management, general facilitation of the procedure and for primary wound closure at the end of the case.              Pietro Cassis Alvan Dame, M.D.    07/28/2018 12:53 PM

## 2018-07-29 ENCOUNTER — Encounter (HOSPITAL_COMMUNITY): Payer: Self-pay | Admitting: Orthopedic Surgery

## 2018-07-29 DIAGNOSIS — M1711 Unilateral primary osteoarthritis, right knee: Secondary | ICD-10-CM | POA: Diagnosis not present

## 2018-07-29 DIAGNOSIS — E663 Overweight: Secondary | ICD-10-CM | POA: Diagnosis present

## 2018-07-29 LAB — BASIC METABOLIC PANEL
Anion gap: 9 (ref 5–15)
BUN: 23 mg/dL (ref 8–23)
CO2: 24 mmol/L (ref 22–32)
Calcium: 8.2 mg/dL — ABNORMAL LOW (ref 8.9–10.3)
Chloride: 103 mmol/L (ref 98–111)
Creatinine, Ser: 0.86 mg/dL (ref 0.44–1.00)
GFR calc Af Amer: >60 mL/min (ref >60)
GFR calc non Af Amer: >60 mL/min (ref >60)
Glucose, Bld: 135 mg/dL — ABNORMAL HIGH (ref 70–99)
Potassium: 4.6 mmol/L (ref 3.5–5.1)
Sodium: 136 mmol/L (ref 135–145)

## 2018-07-29 LAB — CBC
HCT: 30.3 % — ABNORMAL LOW (ref 36.0–46.0)
Hemoglobin: 9.5 g/dL — ABNORMAL LOW (ref 12.0–15.0)
MCH: 30.4 pg (ref 26.0–34.0)
MCHC: 31.4 g/dL (ref 30.0–36.0)
MCV: 96.8 fL (ref 80.0–100.0)
Platelets: 271 10*3/uL (ref 150–400)
RBC: 3.13 MIL/uL — ABNORMAL LOW (ref 3.87–5.11)
RDW: 15 % (ref 11.5–15.5)
WBC: 16.1 10*3/uL — ABNORMAL HIGH (ref 4.0–10.5)
nRBC: 0 % (ref 0.0–0.2)

## 2018-07-29 MED ORDER — HYDROCODONE-ACETAMINOPHEN 5-325 MG PO TABS: 1.0000 | ORAL_TABLET | ORAL | 0 refills | Status: DC | PRN

## 2018-07-29 MED ORDER — DOCUSATE SODIUM 100 MG PO CAPS: 100.0000 mg | ORAL_CAPSULE | Freq: Two times a day (BID) | ORAL | 0 refills | Status: DC

## 2018-07-29 MED ORDER — METHOCARBAMOL 500 MG PO TABS: 500.0000 mg | ORAL_TABLET | Freq: Four times a day (QID) | ORAL | 0 refills | Status: DC | PRN

## 2018-07-29 MED ORDER — POLYETHYLENE GLYCOL 3350 17 G PO PACK: 17.0000 g | PACK | Freq: Two times a day (BID) | ORAL | 0 refills | Status: DC

## 2018-07-29 MED ORDER — FERROUS SULFATE 325 (65 FE) MG PO TABS: 325.0000 mg | ORAL_TABLET | Freq: Three times a day (TID) | ORAL | 0 refills | Status: DC

## 2018-07-29 MED ORDER — RIVAROXABAN 10 MG PO TABS: 10.0000 mg | ORAL_TABLET | Freq: Every day | ORAL | 0 refills | Status: DC

## 2018-07-29 NOTE — Progress Notes (Signed)
Discharge Plan of Care: Outpatient PT  Has DME ( 3 in 1 and RW)

## 2018-07-29 NOTE — Evaluation (Signed)
Physical Therapy Evaluation Patient Details Name: [name] MRN: [id number] DOB: July 15, 1945 Today's Date: 07/29/2018   History of Present Illness  s/p R TKA  Clinical Impression  Pt is s/p TKA resulting in the deficits listed below (see PT Problem List).  Anticipate good progress, will see for a second session and pt will likely be able to d/c later today  Pt will benefit from skilled PT to increase their independence and safety with mobility to allow discharge to the venue listed below.      Follow Up Recommendations Follow surgeon's recommendation for DC plan and follow-up therapies    Equipment Recommendations  None recommended by PT    Recommendations for Other Services       Precautions / Restrictions Precautions Precautions: Knee Restrictions Weight Bearing Restrictions: No Other Position/Activity Restrictions: WBAT      Mobility  Bed Mobility Overal bed mobility: Needs Assistance Bed Mobility: Supine to Sit     Supine to sit: Min guard     General bed mobility comments: for safety  Transfers Overall transfer level: Needs assistance Equipment used: Rolling walker (2 wheeled) Transfers: Sit to/from Stand Sit to Stand: Min guard;Min assist         General transfer comment: cues for hand placement, min assist from lower surface  Ambulation/Gait Ambulation/Gait assistance: Min guard;Min assist Gait Distance (Feet): 80 Feet(10' more) Assistive device: Rolling walker (2 wheeled) Gait Pattern/deviations: Step-to pattern;Trunk flexed;Decreased weight shift to right     General Gait Details: cues for sequence and RW position  Stairs            Wheelchair Mobility    Modified Rankin (Stroke Patients Only)       Balance                                             Pertinent Vitals/Pain Pain Assessment: 0-10 Pain Score: 4  Pain Location: R knee Pain Descriptors / Indicators: Grimacing;Sore Pain Intervention(s):  Monitored during session;Limited activity within patient's tolerance;Repositioned;Premedicated before session    [place] expects to be discharged to:: Private residence Living Arrangements: Children Available Help at Discharge: Family Type of Home: House Home Access: Stairs to enter   Technical brewer of Steps: 2 Home Layout: One [place]: Environmental consultant - 2 wheels;Bedside commode      Prior Function Level of Independence: Independent               Hand Dominance        Extremity/Trunk Assessment   Upper Extremity Assessment Upper Extremity Assessment: Overall WFL for tasks assessed    Lower Extremity Assessment Lower Extremity Assessment: RLE deficits/detail RLE Deficits / Details: grossly 3/5, knee flexion 5 to 65 degrees       Communication   Communication: No difficulties  Cognition Arousal/Alertness: Awake/alert Behavior During Therapy: WFL for tasks assessed/performed Overall Cognitive Status: Within Functional Limits for tasks assessed                                        General Comments General comments (skin integrity, edema, etc.): pt reports poor balance at baseline    Exercises Total Joint Exercises Ankle Circles/Pumps: AROM;10 reps;Both   Assessment/Plan    PT Assessment Patient needs continued PT services  PT Problem List  Decreased strength;Decreased mobility;Decreased range of motion;Decreased activity tolerance;Decreased balance;Pain;Decreased knowledge of use of DME       PT Treatment Interventions Stair training;DME instruction;Gait training;Therapeutic exercise;Patient/family education    PT Goals (Current goals can be found in the Care Plan section)       Frequency 7X/week   Barriers to discharge        Co-evaluation               AM-PAC PT "6 Clicks" Mobility  Outcome Measure Help needed turning from your back to your side while in a flat bed without using bedrails?:  A Little Help needed moving from lying on your back to sitting on the side of a flat bed without using bedrails?: A Little Help needed moving to and from a bed to a chair (including a wheelchair)?: A Little Help needed standing up from a chair using your arms (e.g., wheelchair or bedside chair)?: A Little Help needed to walk in hospital room?: A Little Help needed climbing 3-5 steps with a railing? : A Little 6 Click Score: 18    End of Session Equipment Utilized During Treatment: Gait belt Activity Tolerance: Patient tolerated treatment well Patient left: in chair;with call bell/phone within reach;with chair alarm set   PT Visit Diagnosis: Difficulty in walking, not elsewhere classified (R26.2)    Time: 3151-7616 PT Time Calculation (min) (ACUTE ONLY): 26 min   Charges:   PT Evaluation $PT Eval Low Complexity: 1 Low PT Treatments $Gait Training: 8-22 mins        Kenyon Ana, PT  Pager: [phone] Acute Rehab Dept [facility]): [phone]   07/29/2018   [facility] 07/29/2018, 12:14 PM

## 2018-07-29 NOTE — Progress Notes (Signed)
   07/29/18 1500  PT Visit Information  Last PT Received On 07/29/18 ready for d/c from PT standpoint, RN aware  Assistance Needed +1  History of Present Illness s/p R TKA  Precautions  Precautions Knee  Restrictions  Weight Bearing Restrictions No  Other Position/Activity Restrictions WBAT  Pain Assessment  Pain Assessment 0-10  Pain Score 4  Pain Location R knee  Pain Descriptors / Indicators Grimacing;Sore  Pain Intervention(s) Limited activity within patient's tolerance;Monitored during session;Patient requesting pain meds-RN notified  Cognition  Arousal/Alertness Awake/alert  Behavior During Therapy WFL for tasks assessed/performed  Overall Cognitive Status Within Functional Limits for tasks assessed  Bed Mobility  Bed Mobility Supine to Sit;Sit to Supine  Supine to sit Min guard  Sit to supine Min guard  General bed mobility comments for safety  Transfers  Overall transfer level Needs assistance  Equipment used Rolling walker (2 wheeled)  Transfers Sit to/from Stand  Sit to Stand Min guard;Supervision  General transfer comment cues for hand placement   Ambulation/Gait  Ambulation/Gait assistance Min guard;Supervision  Gait Distance (Feet) 50 Feet  Assistive device Rolling walker (2 wheeled)  Gait Pattern/deviations Step-to pattern;Trunk flexed;Decreased weight shift to right  General Gait Details cues for sequence and RW position  Stairs Yes  Stairs assistance Min assist  Stair Management No rails;Step to pattern;Backwards;With walker  Number of Stairs 2  General stair comments cues for sequence and safe technique  Total Joint Exercises  Ankle Circles/Pumps AROM;10 reps;Both  Quad Sets AROM;Both;10 reps  Heel Slides AAROM;Right;10 reps  Hip ABduction/ADduction AROM;AAROM;Right;10 reps  Straight Leg Raises AROM;AAROM;Right;10 reps  Goniometric ROM grossly 6 to 60 degrees flexion  PT - End of Session  Equipment Utilized During Treatment Gait belt  Activity  Tolerance Patient tolerated treatment well  Patient left with call bell/phone within reach;in bed;with nursing/sitter in room   PT - Assessment/Plan  PT Plan Current plan remains appropriate  PT Visit Diagnosis Difficulty in walking, not elsewhere classified (R26.2)  PT Frequency (ACUTE ONLY) 7X/week  Follow Up Recommendations Follow surgeon's recommendation for DC plan and follow-up therapies  PT equipment None recommended by PT  AM-PAC PT "6 Clicks" Mobility Outcome Measure (Version 2)  Help needed turning from your back to your side while in a flat bed without using bedrails? 3  Help needed moving from lying on your back to sitting on the side of a flat bed without using bedrails? 3  Help needed moving to and from a bed to a chair (including a wheelchair)? 3  Help needed standing up from a chair using your arms (e.g., wheelchair or bedside chair)? 3  Help needed to walk in hospital room? 3  Help needed climbing 3-5 steps with a railing?  3  6 Click Score 18  Consider Recommendation of Discharge To: Home with [facility]  PT Goal Progression  Progress towards PT goals Progressing toward goals  PT Time Calculation  PT Start Time (ACUTE ONLY) 1422  PT Stop Time (ACUTE ONLY) 1458  PT Time Calculation (min) (ACUTE ONLY) 36 min  PT General Charges  $$ ACUTE PT VISIT 1 Visit  PT Treatments  $Gait Training 8-22 mins  $Therapeutic Exercise 8-22 mins

## 2018-07-29 NOTE — Progress Notes (Signed)
     Subjective: 1 Day Post-Op Procedure(s) (LRB): TOTAL KNEE ARTHROPLASTY (Right)   Patient reports pain as mild, pain controlled. No reported events throughout the night. She's a little hesitant with how she feels she will progress.  Dr. Alvan Dame discussed the procedure, findings and expectations moving forward.  Patient ready to be discharged home if she does well with PT and deemed safe.   Patient's anticipated LOS is less than 2 midnights, meeting these requirements: - Lives within 1 hour of care - Has a competent adult at home to recover with post-op recover - NO history of  - Chronic pain requiring opiods  - Diabetes  - Coronary Artery Disease  - Heart failure  - Heart attack  - Stroke  - DVT/VTE  - Cardiac arrhythmia  - Respiratory Failure/COPD  - Renal failure  - Anemia  - Advanced Liver disease     Objective:   VITALS:   Vitals:   07/29/18 0145 07/29/18 0452  BP: 136/68 (!) 149/80  Pulse: 95 95  Resp: 16 16  Temp: 98 F (36.7 C) 97.7 F (36.5 C)  SpO2: 97% 98%    Dorsiflexion/Plantar flexion intact Incision: dressing C/D/I No cellulitis present Compartment soft  LABS Recent Labs    07/29/18 0319  HGB 9.5*  HCT 30.3*  WBC 16.1*  PLT 271    Recent Labs    07/29/18 0319  NA 136  K 4.6  BUN 23  CREATININE 0.86  GLUCOSE 135*     Assessment/Plan: 1 Day Post-Op Procedure(s) (LRB): TOTAL KNEE ARTHROPLASTY (Right) Foley cath d/c'ed Advance diet Up with therapy D/C IV fluids Discharge home if she does well with PT Follow up in 2 weeks at [facility] ([place]). Follow up with OLIN,Suzan Manon D in 2 weeks.  Contact information:  [facility]) [address], [place] [phone]    Overweight (BMI 25-29.9) Estimated body mass index is 25.7 kg/m as calculated from the following:   Height as of this encounter: 5' (1.524 m).   Weight as of this encounter:  59.7 kg. Patient also counseled that weight may inhibit the healing process Patient counseled that losing weight will help with future health issues      [place] Pugh. Tula Schryver   PAC  07/29/2018, 7:52 AM

## 2018-08-04 NOTE — Discharge Summary (Signed)
Physician Discharge Summary  Patient ID: [name] MRN: [id number] DOB/AGE: 07/13/1945 73 y.o.  Admit date: 07/28/2018 Discharge date: 07/29/2018   Procedures:  Procedure(s) (LRB): TOTAL KNEE ARTHROPLASTY (Right)  Attending Physician:  Dr. Paralee Cancel   Admission Diagnoses:   Right knee primary OA / pain  Discharge Diagnoses:  Principal Problem:   S/P right TKA Active Problems:   Overweight (BMI 25.0-29.9)  Past Medical History:  Diagnosis Date   Allergies    Asthma    Bruises easily    Crohn's disease ([place])    GI Dr Beulah Gandy in [place], [place]    Deviated septum    GERD (gastroesophageal reflux disease)    Headache    occ    Hiatal hernia    HTN (hypertension)    pcp Dr Margaretann Loveless  in [place], [place]    Neuropathy    fingers,periodically    Osteoarthritis    Pancreatic insufficiency    Rheumatoid arthritis ([place])    Scoliosis    Squamous cell carcinoma of arm    left    Whooping cough with pneumonia    2017    HPI:    [name], 73 y.o. female, has a history of pain and functional disability in the right knee due to arthritis and has failed non-surgical conservative treatments for greater than 12 weeks to includeNSAID's and/or analgesics, corticosteriod injections, viscosupplementation injections and activity modification.  Onset of symptoms was gradual, starting >10 years ago with gradually worsening course since that time. The patient noted no past surgery on the right knee(s).  Patient currently rates pain in the right knee(s) at 7 out of 10 with activity. Patient has night pain, worsening of pain with activity and weight bearing, pain that interferes with activities of daily living, pain with passive range of motion, crepitus and joint swelling.  Patient has evidence of periarticular osteophytes and joint space narrowing by imaging studies. There is no active infection.  Risks, benefits and expectations were discussed with  the patient.  Risks including but not limited to the risk of anesthesia, blood clots, nerve damage, blood vessel damage, failure of the prosthesis, infection and up to and including death.  Patient understand the risks, benefits and expectations and wishes to proceed with surgery.   PCP: Patient, No Pcp Per   Discharged Condition: good  Hospital Course:  Patient underwent the above stated procedure on 07/28/2018. Patient tolerated the procedure well and brought to the recovery room in good condition and subsequently to the floor.  POD #1 BP: 149/80 ; Pulse: 95 ; Temp: 97.7 F (36.5 C) ; Resp: 16 Patient reports pain as mild, pain controlled. No reported events throughout the night. She's a little hesitant with how she feels she will progress.  Dr. Alvan Dame discussed the procedure, findings and expectations moving forward.  Patient ready to be discharged home. Dorsiflexion/plantar flexion intact, incision: dressing C/D/I, no cellulitis present and compartment soft.   LABS  Basename    HGB     9.5  HCT     30.3    Discharge Exam: General appearance: alert, cooperative and no distress Extremities: Homans sign is negative, no sign of DVT, no edema, redness or tenderness in the calves or thighs and no ulcers, gangrene or trophic changes  Disposition:  Home with follow up in 2 weeks   Follow-up Information    Paralee Cancel, MD. Schedule an appointment as soon as possible for a visit in 2 weeks.   Specialty:  Orthopedic Surgery Contact information: [address] [place] [address] [phone]           Discharge Instructions    Call MD / Call 911   Complete by: As directed    If you experience chest pain or shortness of breath, CALL 911 and be transported to the hospital emergency room.  If you develope a fever above 101 F, pus (white drainage) or increased drainage or redness at the wound, or calf pain, call your surgeon's office.   Change dressing   Complete by: As  directed    Maintain surgical dressing until follow up in the clinic. If the edges start to pull up, may reinforce with tape. If the dressing is no longer working, may remove and cover with gauze and tape, but must keep the area dry and clean.  Call with any questions or concerns.   Constipation Prevention   Complete by: As directed    Drink plenty of fluids.  Prune juice may be helpful.  You may use a stool softener, such as Colace (over the counter) 100 mg twice a day.  Use MiraLax (over the counter) for constipation as needed.   Diet - low sodium heart healthy   Complete by: As directed    Discharge instructions   Complete by: As directed    Maintain surgical dressing until follow up in the clinic. If the edges start to pull up, may reinforce with tape. If the dressing is no longer working, may remove and cover with gauze and tape, but must keep the area dry and clean.  Follow up in 2 weeks at [facility]. Call with any questions or concerns.   Increase activity slowly as tolerated   Complete by: As directed    Weight bearing as tolerated with assist device (walker, cane, etc) as directed, use it as long as suggested by your surgeon or therapist, typically at least 4-6 weeks.   TED hose   Complete by: As directed    Use stockings (TED hose) for 2 weeks on both leg(s).  You may remove them at night for sleeping.      Allergies as of 07/29/2018      Reactions   Aspirin    Intestinal Bleeding   Ciprofloxacin Nausea And Vomiting   Codeine Nausea And Vomiting   Demerol [meperidine Hcl] Nausea And Vomiting   Lactose Intolerance (gi) Other (See Comments)   Pt has Crohn's    Latex    Redness and skin peeling   Other    Nuts-itching in throat  Seeds-stomach issues with chrons    Septra [sulfamethoxazole-trimethoprim] Nausea Only      Medication List    STOP taking these medications   acetaminophen 650 MG CR tablet Commonly known as: TYLENOL   diclofenac sodium 1 %  Gel Commonly known as: VOLTAREN   hydroxychloroquine 200 MG tablet Commonly known as: PLAQUENIL     TAKE these medications   amoxicillin 500 MG capsule Commonly known as: AMOXIL Take 500 mg by mouth 2 (two) times daily as needed (Chrone's flare up).   calcium carbonate 1500 (600 Ca) MG Tabs tablet Commonly known as: OSCAL Take 600 mg of elemental calcium by mouth 2 (two) times daily with a meal.   cetirizine 10 MG tablet Commonly known as: ZYRTEC Take 10 mg by mouth daily.   dicyclomine 20 MG tablet Commonly known as: BENTYL Take 20 mg by mouth 4 (four) times daily as needed for spasms.  docusate sodium 100 MG capsule Commonly known as: Colace Take 1 capsule (100 mg total) by mouth 2 (two) times daily.   DULoxetine 60 MG capsule Commonly known as: CYMBALTA Take 60 mg by mouth 2 (two) times daily.   ferrous sulfate 325 (65 FE) MG tablet Commonly known as: FerrouSul Take 1 tablet (325 mg total) by mouth 3 (three) times daily with meals for 14 days.   HYDROcodone-acetaminophen 5-325 MG tablet Commonly known as: Norco Take 1-2 tablets by mouth every 4 (four) hours as needed for moderate pain or severe pain.   lipase/protease/amylase 12000 units Cpep capsule Commonly known as: CREON Take 24,000 Units by mouth 3 (three) times daily with meals.   loperamide 2 MG capsule Commonly known as: IMODIUM Take 2 mg by mouth 3 (three) times daily as needed for diarrhea or loose stools.   losartan 50 MG tablet Commonly known as: COZAAR Take 50 mg by mouth daily.   mesalamine 1.2 g EC tablet Commonly known as: LIALDA Take 4.8 g by mouth daily with breakfast.   methocarbamol 500 MG tablet Commonly known as: Robaxin Take 1 tablet (500 mg total) by mouth every 6 (six) hours as needed for muscle spasms.   multivitamin with minerals Tabs tablet Take 1 tablet by mouth daily.   omeprazole 40 MG capsule Commonly known as: PRILOSEC Take 40 mg by mouth 2 (two) times daily.    ondansetron 4 MG tablet Commonly known as: ZOFRAN Take 4 mg by mouth every 8 (eight) hours as needed for nausea or vomiting.   polyethylene glycol 17 g packet Commonly known as: MIRALAX / GLYCOLAX Take 17 g by mouth 2 (two) times daily.   rivaroxaban 10 MG Tabs tablet Commonly known as: Xarelto Take 1 tablet (10 mg total) by mouth daily.   rOPINIRole 2 MG tablet Commonly known as: REQUIP Take 4 mg by mouth at bedtime.   Vitamin D 50 MCG (2000 UT) Caps Take 2,000 Units by mouth daily.            Discharge Care Instructions  (From admission, onward)         Start     Ordered   07/29/18 0000  Change dressing    Comments: Maintain surgical dressing until follow up in the clinic. If the edges start to pull up, may reinforce with tape. If the dressing is no longer working, may remove and cover with gauze and tape, but must keep the area dry and clean.  Call with any questions or concerns.   07/29/18 0756           Signed: [place]. Humzah Harty   PA-C  08/04/2018, 8:05 AM

## 2018-08-07 ENCOUNTER — Encounter: Payer: Self-pay | Admitting: Internal Medicine

## 2018-08-07 ENCOUNTER — Other Ambulatory Visit: Payer: Self-pay

## 2018-08-07 ENCOUNTER — Ambulatory Visit (INDEPENDENT_AMBULATORY_CARE_PROVIDER_SITE_OTHER): Payer: Medicare Other | Admitting: Internal Medicine

## 2018-08-07 DIAGNOSIS — K5010 Crohn's disease of large intestine without complications: Secondary | ICD-10-CM | POA: Diagnosis not present

## 2018-08-07 DIAGNOSIS — K8689 Other specified diseases of pancreas: Secondary | ICD-10-CM | POA: Diagnosis not present

## 2018-08-07 DIAGNOSIS — I10 Essential (primary) hypertension: Secondary | ICD-10-CM | POA: Diagnosis not present

## 2018-08-07 DIAGNOSIS — M170 Bilateral primary osteoarthritis of knee: Secondary | ICD-10-CM

## 2018-08-07 DIAGNOSIS — Z96651 Presence of right artificial knee joint: Secondary | ICD-10-CM

## 2018-08-07 DIAGNOSIS — K449 Diaphragmatic hernia without obstruction or gangrene: Secondary | ICD-10-CM

## 2018-08-07 NOTE — Progress Notes (Signed)
Virtual Visit via Video Note  I connected with [name] on 08/07/18 at 10:00 AM EDT by a video enabled telemedicine application and verified that I am speaking with the correct person using two identifiers.  Location patient: home Location provider: work office Persons participating in the virtual visit: patient, provider  I discussed the limitations of evaluation and management by telemedicine and the availability of in person appointments. The patient expressed understanding and agreed to proceed.   HPI: This is a scheduled visit to establish care.  She and her husband have recently moved to [place] from [place] to be close to their daughter.  She has 3 grown children, she is now retired.  She used to smoke but quit over 30 years ago, does not drink alcohol.  She does not know of any family history of significance.  She has multiple allergies to aspirin, sulfa drugs, Demerol, codeine, latex and tree nuts.  She takes multiple medications but does not have her list handy and is unable to give me a complete medication list.  She is interested in having a local gastroenterologist.  Last week she had a right total knee replacement by Dr. Alvan Dame.  Her past medical history is significant for bilateral knee osteoarthritis status post recent right TKR, Crohn's disease diagnosed in 1997, a large hiatal hernia, hypertension that has not been well controlled, pancreatic insufficiency for which he uses Creon with meals, significant scoliosis.   ROS: Constitutional: Denies fever, chills, diaphoresis, appetite change and fatigue.  HEENT: Denies photophobia, eye pain, redness, hearing loss, ear pain, congestion, sore throat, rhinorrhea, sneezing, mouth sores, trouble swallowing, neck pain, neck stiffness and tinnitus.   Respiratory: Denies SOB, DOE, cough, chest tightness,  and wheezing.   Cardiovascular: Denies chest pain, palpitations and leg swelling.  Gastrointestinal:  Denies nausea, vomiting, abdominal pain, diarrhea, constipation, blood in stool and abdominal distention.  Genitourinary: Denies dysuria, urgency, frequency, hematuria, flank pain and difficulty urinating.  Endocrine: Denies: hot or cold intolerance, sweats, changes in hair or nails, polyuria, polydipsia. Musculoskeletal: Denies myalgias, back pain, joint swelling, arthralgias and gait problem.  Skin: Denies pallor, rash and wound.  Neurological: Denies dizziness, seizures, syncope, weakness, light-headedness, numbness and headaches.  Hematological: Denies adenopathy. Easy bruising, personal or family bleeding history  Psychiatric/Behavioral: Denies suicidal ideation, mood changes, confusion, nervousness, sleep disturbance and agitation   Past Medical History:  Diagnosis Date  . Allergies   . Asthma   . Bruises easily   . Crohn's disease ([place])    GI Dr Beulah Gandy in [place], [place]   . Deviated septum   . GERD (gastroesophageal reflux disease)   . Headache    occ   . Hiatal hernia   . HTN (hypertension)    pcp Dr Margaretann Loveless  in [place], [place]   . Neuropathy    fingers,periodically   . Osteoarthritis   . Pancreatic insufficiency   . Rheumatoid arthritis ([place])   . Scoliosis   . Squamous cell carcinoma of arm    left   . Whooping cough with pneumonia    2017    Past Surgical History:  Procedure Laterality Date  . CATARACT EXTRACTION, BILATERAL  2018   with IOC implant   . COLONOSCOPY    . knee fracture     . NASAL FRACTURE SURGERY     needed b/c she was getting frequent sinus infections   . 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 UPPER GASTROINTESTINAL ENDOSCOPY      History reviewed. No pertinent family history.  SOCIAL HX:   reports that she quit smoking about 30 years ago. Her smoking use included cigarettes. She quit  after 30.00 years of use. She has never used smokeless tobacco. She reports previous alcohol use. No history on file for drug.   Current Outpatient Medications:  .  amoxicillin (AMOXIL) 500 MG capsule, Take 500 mg by mouth 2 (two) times daily as needed (Chrone's flare up). , Disp: , Rfl:  .  calcium carbonate (OSCAL) 1500 (600 Ca) MG TABS tablet, Take 600 mg of elemental calcium by mouth 2 (two) times daily with a meal., Disp: , Rfl:  .  cetirizine (ZYRTEC) 10 MG tablet, Take 10 mg by mouth daily., Disp: , Rfl:  .  Cholecalciferol (VITAMIN D) 50 MCG (2000 UT) CAPS, Take 2,000 Units by mouth daily., Disp: , Rfl:  .  dicyclomine (BENTYL) 20 MG tablet, Take 20 mg by mouth 4 (four) times daily as needed for spasms., Disp: , Rfl:  .  docusate sodium (COLACE) 100 MG capsule, Take 1 capsule (100 mg total) by mouth 2 (two) times daily., Disp: 28 capsule, Rfl: 0 .  DULoxetine (CYMBALTA) 60 MG capsule, Take 60 mg by mouth 2 (two) times daily., Disp: , Rfl:  .  ferrous sulfate (FERROUSUL) 325 (65 FE) MG tablet, Take 1 tablet (325 mg total) by mouth 3 (three) times daily with meals for 14 days., Disp: 42 tablet, Rfl: 0 .  HYDROcodone-acetaminophen (NORCO) 5-325 MG tablet, Take 1-2 tablets by mouth every 4 (four) hours as needed for moderate pain or severe pain., Disp: 60 tablet, Rfl: 0 .  lipase/protease/amylase (CREON) 12000 units CPEP capsule, Take 24,000 Units by mouth 3 (three) times daily with meals., Disp: , Rfl:  .  loperamide (IMODIUM) 2 MG capsule, Take 2 mg by mouth 3 (three) times daily as needed for diarrhea or loose stools., Disp: , Rfl:  .  losartan (COZAAR) 50 MG tablet, Take 50 mg by mouth daily., Disp: , Rfl:  .  mesalamine (LIALDA) 1.2 g EC tablet, Take 4.8 g by mouth daily with breakfast., Disp: , Rfl:  .  methocarbamol (ROBAXIN) 500 MG tablet, Take 1 tablet (500 mg total) by mouth every 6 (six) hours as needed for muscle spasms., Disp: 40 tablet, Rfl: 0 .  Multiple Vitamin (MULTIVITAMIN  WITH MINERALS) TABS tablet, Take 1 tablet by mouth daily., Disp: , Rfl:  .  omeprazole (PRILOSEC) 40 MG capsule, Take 40 mg by mouth 2 (two) times daily., Disp: , Rfl:  .  ondansetron (ZOFRAN) 4 MG tablet, Take 4 mg by mouth every 8 (eight) hours as needed for nausea or vomiting., Disp: , Rfl:  .  polyethylene glycol (MIRALAX / GLYCOLAX) 17 g packet, Take 17 g by mouth 2 (two) times daily., Disp: 28 packet, Rfl: 0 .  rivaroxaban (XARELTO) 10 MG TABS tablet, Take 1 tablet (10 mg total) by mouth daily., Disp: 14 tablet, Rfl: 0 .  rOPINIRole (REQUIP) 2 MG tablet, Take 4 mg by mouth at bedtime., Disp: , Rfl:   EXAM:   VITALS per patient if applicable: None reported  GENERAL: alert, oriented, appears well and in no acute distress  HEENT: atraumatic, conjunttiva clear, no obvious abnormalities on inspection of external nose and ears, wears corrective lenses  NECK: normal movements of the head and neck  LUNGS:  on inspection no signs of respiratory distress, breathing rate appears normal, no obvious gross increased work of breathing, gasping or wheezing  CV: no obvious cyanosis  MS: moves all visible extremities without noticeable abnormality  PSYCH/NEURO: pleasant and cooperative, no obvious depression or anxiety, speech and thought processing grossly intact  ASSESSMENT AND PLAN:   Crohn's disease of large intestine without complication (HCC) -No major issues currently other than continued diarrhea. -Is interested in having a referral to local GI.  Osteoarthritis of both knees, unspecified osteoarthritis type  -S/P right TKA last week by Dr. Alvan Dame  Essential hypertension  -Per report has not been well controlled, will have her come in for blood pressure monitoring.  Pancreatic insufficiency  -Continue Creon with meals.  She will contact the office to provide [place] with an updated medication list and to schedule for her annual physical.  Time spent: 45 minutes.     I discussed the  assessment and treatment plan with the patient. The patient was provided an opportunity to ask questions and all were answered. The patient agreed with the plan and demonstrated an understanding of the instructions.   The patient was advised to call back or seek an in-person evaluation if the symptoms worsen or if the condition fails to improve as anticipated.    Lelon Frohlich, MD  [facility]

## 2018-08-14 ENCOUNTER — Telehealth: Payer: Self-pay | Admitting: Internal Medicine

## 2018-08-14 NOTE — Telephone Encounter (Signed)
Copied from [place] [id number]. Topic: General - Other >> Aug 14, 2018 11:22 AM Rainey Pines A wrote: Patient would like a callback from Dr. Jerilee Hoh nurse in regards to which GI doctor she should be referred to

## 2018-08-17 NOTE — Telephone Encounter (Signed)
Clinic RN spoke with patient. Patient wants to know if Dr. Jerilee Hoh has a specific MD at GI that she wants patient to see. Please advise

## 2018-08-18 NOTE — Telephone Encounter (Signed)
First available [facility] should be fine

## 2018-08-21 ENCOUNTER — Telehealth: Payer: Self-pay | Admitting: Internal Medicine

## 2018-08-21 NOTE — Telephone Encounter (Signed)
Left detailed message on machine for patient returning her call. See Dr Ledell Noss recommendation Review medication list needed

## 2018-08-21 NOTE — Telephone Encounter (Signed)
Dr. Henrene Pastor, there is a referral for hx of Crohn's disease, pancreatic insufficiency.    Pt requested you as a GI MD.  Pt's records will be sent to you for review.  Please advise scheduling.

## 2018-09-04 ENCOUNTER — Encounter: Payer: Self-pay | Admitting: Internal Medicine

## 2018-09-18 ENCOUNTER — Encounter: Payer: Self-pay | Admitting: Plastic Surgery

## 2018-09-18 ENCOUNTER — Ambulatory Visit (INDEPENDENT_AMBULATORY_CARE_PROVIDER_SITE_OTHER): Payer: Medicare Other | Admitting: Plastic Surgery

## 2018-09-18 ENCOUNTER — Other Ambulatory Visit: Payer: Self-pay

## 2018-09-18 VITALS — BP 173/90 | Temp 98.3°F | Ht 60.0 in | Wt 125.0 lb

## 2018-09-18 DIAGNOSIS — Z96651 Presence of right artificial knee joint: Secondary | ICD-10-CM | POA: Diagnosis not present

## 2018-09-18 DIAGNOSIS — S81001A Unspecified open wound, right knee, initial encounter: Secondary | ICD-10-CM | POA: Insufficient documentation

## 2018-09-18 NOTE — Progress Notes (Signed)
Patient ID: [name], female    DOB: 10-29-1945, 73 y.o.   MRN: [id number]   Chief Complaint  Patient presents with  . Skin Problem    The patient is a 73 year old female here with family for evaluation of her right knee.  She underwent knee surgery.  She had some breakdown pretty soon afterwards.  She thinks she had some allergic reaction or sensitivity to the tape.  The wound is 2 x 3.5 cm in size.  It is clean looking.  There is some fibrous tissue and some granulation tissue noted.  It does not appear to be infected.  She does have some swelling as we would expect for her previous surgery.  She has vast sun damage to her skin throughout.  She is currently using a nonstick pad.  She was on doxycycline for possible infection.  This flared up her Crohn's according to the patient.  She is now off of the Doxy.  She would like to avoid it if possible.   Review of Systems  Constitutional: Positive for activity change. Negative for appetite change.  HENT: Negative.   Eyes: Negative.   Respiratory: Negative.   Cardiovascular: Negative.   Gastrointestinal: Positive for abdominal pain.  Endocrine: Negative.   Genitourinary: Negative.   Musculoskeletal: Positive for gait problem and joint swelling.  Skin: Negative for color change and wound.  Hematological: Negative.   Psychiatric/Behavioral: Negative.     Past Medical History:  Diagnosis Date  . Allergies   . Asthma   . Bruises easily   . Crohn's disease ([place])    GI Dr Beulah Gandy in [place], [place]   . Deviated septum   . GERD (gastroesophageal reflux disease)   . Headache    occ   . Hiatal hernia   . HTN (hypertension)    pcp Dr Margaretann Loveless  in [place], [place]   . Neuropathy    fingers,periodically   . Osteoarthritis   . Pancreatic insufficiency   . Rheumatoid arthritis ([place])   . Scoliosis   . Squamous cell carcinoma of arm    left   . Whooping cough with pneumonia    2017    Past Surgical History:   Procedure Laterality Date  . CATARACT EXTRACTION, BILATERAL  2018   with IOC implant   . COLONOSCOPY    . knee fracture     . NASAL FRACTURE SURGERY     needed b/c she was getting frequent sinus infections   . 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 UPPER GASTROINTESTINAL ENDOSCOPY        Current Outpatient Medications:  .  amoxicillin (AMOXIL) 500 MG capsule, Take 500 mg by mouth 2 (two) times daily as needed (Chrone's flare up). , Disp: , Rfl:  .  calcium carbonate (OSCAL) 1500 (600 Ca) MG TABS tablet, Take 600 mg of elemental calcium by mouth 2 (two) times daily with a meal., Disp: , Rfl:  .  cetirizine (ZYRTEC) 10 MG tablet, Take 10 mg by mouth daily., Disp: , Rfl:  .  Cholecalciferol (VITAMIN D) 50 MCG (2000 UT) CAPS, Take 2,000 Units by mouth daily., Disp: , Rfl:  .  dicyclomine (BENTYL) 20 MG tablet, Take 20 mg by mouth 4 (four) times daily as needed for spasms., Disp: , Rfl:  .  docusate sodium (COLACE) 100 MG capsule, Take 1  capsule (100 mg total) by mouth 2 (two) times daily., Disp: 28 capsule, Rfl: 0 .  DULoxetine (CYMBALTA) 60 MG capsule, Take 60 mg by mouth 2 (two) times daily., Disp: , Rfl:  .  ferrous sulfate (FERROUSUL) 325 (65 FE) MG tablet, Take 1 tablet (325 mg total) by mouth 3 (three) times daily with meals for 14 days., Disp: 42 tablet, Rfl: 0 .  HYDROcodone-acetaminophen (NORCO) 5-325 MG tablet, Take 1-2 tablets by mouth every 4 (four) hours as needed for moderate pain or severe pain., Disp: 60 tablet, Rfl: 0 .  lipase/protease/amylase (CREON) 12000 units CPEP capsule, Take 24,000 Units by mouth 3 (three) times daily with meals., Disp: , Rfl:  .  loperamide (IMODIUM) 2 MG capsule, Take 2 mg by mouth 3 (three) times daily as needed for diarrhea or loose stools., Disp: , Rfl:  .  losartan (COZAAR) 50 MG tablet, Take 50 mg by mouth daily.,  Disp: , Rfl:  .  mesalamine (LIALDA) 1.2 g EC tablet, Take 4.8 g by mouth daily with breakfast., Disp: , Rfl:  .  methocarbamol (ROBAXIN) 500 MG tablet, Take 1 tablet (500 mg total) by mouth every 6 (six) hours as needed for muscle spasms., Disp: 40 tablet, Rfl: 0 .  Multiple Vitamin (MULTIVITAMIN WITH MINERALS) TABS tablet, Take 1 tablet by mouth daily., Disp: , Rfl:  .  omeprazole (PRILOSEC) 40 MG capsule, Take 40 mg by mouth 2 (two) times daily., Disp: , Rfl:  .  ondansetron (ZOFRAN) 4 MG tablet, Take 4 mg by mouth every 8 (eight) hours as needed for nausea or vomiting., Disp: , Rfl:  .  polyethylene glycol (MIRALAX / GLYCOLAX) 17 g packet, Take 17 g by mouth 2 (two) times daily., Disp: 28 packet, Rfl: 0 .  rivaroxaban (XARELTO) 10 MG TABS tablet, Take 1 tablet (10 mg total) by mouth daily., Disp: 14 tablet, Rfl: 0 .  rOPINIRole (REQUIP) 2 MG tablet, Take 4 mg by mouth at bedtime., Disp: , Rfl:    Objective:   Vitals:   09/18/18 1201  BP: (!) 173/90  Temp: 98.3 F (36.8 C)  SpO2: 96%    Physical Exam Vitals signs and nursing note reviewed.  Constitutional:      Appearance: Normal appearance.  HENT:     Head: Normocephalic.  Cardiovascular:     Rate and Rhythm: Normal rate.  Pulmonary:     Effort: Pulmonary effort is normal.  Abdominal:     General: Abdomen is flat. There is no distension.     Tenderness: There is no abdominal tenderness.  Musculoskeletal:       Legs:  Skin:    General: Skin is warm.  Neurological:     General: No focal deficit present.     Mental Status: She is alert and oriented to person, place, and time.  Psychiatric:        Mood and Affect: Mood normal.        Behavior: Behavior normal.        Thought Content: Thought content normal.     Assessment & Plan:  S/P right TKA  Open wound of right knee, initial encounter  Recommend excision of right knee wound with placement of ACell and Praveena.  Also recommend compression stockings for the  legs.  Multivitamin and vitamin C daily and increase protein intake. Pictures were obtained of the patient and placed in the chart with the patient's or guardian's permission.   [place], DO

## 2018-09-22 ENCOUNTER — Other Ambulatory Visit: Payer: Self-pay

## 2018-09-22 ENCOUNTER — Encounter: Payer: Self-pay | Admitting: Family Medicine

## 2018-09-22 ENCOUNTER — Telehealth (INDEPENDENT_AMBULATORY_CARE_PROVIDER_SITE_OTHER): Payer: Medicare Other | Admitting: Family Medicine

## 2018-09-22 DIAGNOSIS — R21 Rash and other nonspecific skin eruption: Secondary | ICD-10-CM

## 2018-09-22 MED ORDER — TRIAMCINOLONE ACETONIDE 0.1 % EX CREA: 1.0000 "application " | TOPICAL_CREAM | Freq: Two times a day (BID) | CUTANEOUS | 0 refills | Status: DC

## 2018-09-22 NOTE — Patient Instructions (Signed)
-  Zyrtec once daily  -I sent the medication cream we discussed to your pharmacy:  Meds ordered this encounter  Medications  . triamcinolone cream (KENALOG) 0.1 %    Sig: Apply 1 application topically 2 (two) times daily.    Dispense:  45 g    Refill:  0    Please let us know if you have any questions or concerns regarding this prescription.  I hope you are feeling better soon! Seek care promptly if your symptoms worsen, new concerns arise or you are not improving with treatment.

## 2018-09-22 NOTE — Progress Notes (Signed)
Virtual Visit via Video Note  I connected with [name]  on 09/22/18 at 11:40 AM EDT by a video enabled telemedicine application and verified that I am speaking with the correct person using two identifiers.  Location patient: home Location provider:work or home office Persons participating in the virtual visit: patient, provider  I discussed the limitations of evaluation and management by telemedicine and the availability of in person appointments. The patient expressed understanding and agreed to proceed.   HPI:  Acute visit for Rash: -started last week after being outside working in the yard and chopping weeds -on arms and now on legs, did have some vesicles, very itchy -no fevers, malaise, lesions on mucus membranes, face or eyes or trouble breathing -tried calamine which has not resolved it -cant stop scratching -no pets at new home or new exposures otherwise   ROS: See pertinent positives and negatives per HPI.  Past Medical History:  Diagnosis Date  . Allergies   . Asthma   . Bruises easily   . Crohn's disease ([place])    GI Dr Beulah Gandy in [place], [place]   . Deviated septum   . GERD (gastroesophageal reflux disease)   . Headache    occ   . Hiatal hernia   . HTN (hypertension)    pcp Dr Margaretann Loveless  in [place], [place]   . Neuropathy    fingers,periodically   . Osteoarthritis   . Pancreatic insufficiency   . Rheumatoid arthritis ([place])   . Scoliosis   . Squamous cell carcinoma of arm    left   . Whooping cough with pneumonia    2017    Past Surgical History:  Procedure Laterality Date  . CATARACT EXTRACTION, BILATERAL  2018   with IOC implant   . COLONOSCOPY    . knee fracture     . NASAL FRACTURE SURGERY     needed b/c she was getting frequent sinus infections   . 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  UPPER GASTROINTESTINAL ENDOSCOPY      No family history on file.  SOCIAL HX: see hpi   Current Outpatient Medications:  .  amoxicillin (AMOXIL) 500 MG capsule, Take 500 mg by mouth 2 (two) times daily as needed (Chrone's flare up). , Disp: , Rfl:  .  calcium carbonate (OSCAL) 1500 (600 Ca) MG TABS tablet, Take 600 mg of elemental calcium by mouth 2 (two) times daily with a meal., Disp: , Rfl:  .  cetirizine (ZYRTEC) 10 MG tablet, Take 10 mg by mouth daily., Disp: , Rfl:  .  Cholecalciferol (VITAMIN D) 50 MCG (2000 UT) CAPS, Take 2,000 Units by mouth daily., Disp: , Rfl:  .  dicyclomine (BENTYL) 20 MG tablet, Take 20 mg by mouth 4 (four) times daily as needed for spasms., Disp: , Rfl:  .  docusate sodium (COLACE) 100 MG capsule, Take 1 capsule (100 mg total) by mouth 2 (two) times daily., Disp: 28 capsule, Rfl: 0 .  DULoxetine (CYMBALTA) 60 MG capsule, Take 60 mg by mouth 2 (two) times daily., Disp: , Rfl:  .  HYDROcodone-acetaminophen (NORCO) 5-325 MG tablet, Take 1-2 tablets by mouth every 4 (four) hours as needed for moderate pain or severe pain., Disp: 60 tablet, Rfl: 0 .  lipase/protease/amylase (CREON) 12000 units CPEP capsule, Take 24,000 Units by mouth 3 (three) times daily with meals.,  Disp: , Rfl:  .  loperamide (IMODIUM) 2 MG capsule, Take 2 mg by mouth 3 (three) times daily as needed for diarrhea or loose stools., Disp: , Rfl:  .  losartan (COZAAR) 50 MG tablet, Take 50 mg by mouth daily., Disp: , Rfl:  .  mesalamine (LIALDA) 1.2 g EC tablet, Take 4.8 g by mouth daily with breakfast., Disp: , Rfl:  .  methocarbamol (ROBAXIN) 500 MG tablet, Take 1 tablet (500 mg total) by mouth every 6 (six) hours as needed for muscle spasms., Disp: 40 tablet, Rfl: 0 .  Multiple Vitamin (MULTIVITAMIN WITH MINERALS) TABS tablet, Take 1 tablet by mouth daily., Disp: , Rfl:  .  omeprazole (PRILOSEC) 40 MG capsule, Take 40 mg by mouth 2 (two) times daily., Disp: , Rfl:  .  ondansetron (ZOFRAN) 4 MG  tablet, Take 4 mg by mouth every 8 (eight) hours as needed for nausea or vomiting., Disp: , Rfl:  .  polyethylene glycol (MIRALAX / GLYCOLAX) 17 g packet, Take 17 g by mouth 2 (two) times daily., Disp: 28 packet, Rfl: 0 .  rivaroxaban (XARELTO) 10 MG TABS tablet, Take 1 tablet (10 mg total) by mouth daily., Disp: 14 tablet, Rfl: 0 .  rOPINIRole (REQUIP) 2 MG tablet, Take 4 mg by mouth at bedtime., Disp: , Rfl:  .  ferrous sulfate (FERROUSUL) 325 (65 FE) MG tablet, Take 1 tablet (325 mg total) by mouth 3 (three) times daily with meals for 14 days., Disp: 42 tablet, Rfl: 0 .  triamcinolone cream (KENALOG) 0.1 %, Apply 1 application topically 2 (two) times daily., Disp: 45 g, Rfl: 0  EXAM:  VITALS per patient if applicable:  GENERAL: alert, oriented, appears well and in no acute distress  HEENT: atraumatic, conjunttiva clear, no obvious abnormalities on inspection of external nose and ears  NECK: normal movements of the head and neck  LUNGS: on inspection no signs of respiratory distress, breathing rate appears normal, no obvious gross SOB, gasping or wheezing  CV: no obvious cyanosis  SKIN: erythematous patches and papules with excoriations on the arms and legs  MS: moves all visible extremities without noticeable abnormality  PSYCH/NEURO: pleasant and cooperative, no obvious depression or anxiety, speech and thought processing grossly intact  ASSESSMENT AND PLAN:  Discussed the following assessment and plan:  Skin rash  -we discussed possible serious and likely etiologies, options for evaluation and workup, limitations of telemedicine visit vs in person visit, treatment, treatment risks and precautions. Pt prefers to treat via telemedicine empirically rather then risking or undertaking an in person visit at this moment. Query contact dermatitis (toxicodendron dermatitis) vs insects bites most likely. Difficult to distinguish given lesions have been traumatized. Opted to try Zyrtec  28m daily (she takes for allergies) along with topical Triam cr 0.1% bid to affected areas for 1-2 weeks. Patient agrees to seek prompt in person care if worsening, new symptoms arise, or if is not improving with treatment.   I discussed the assessment and treatment plan with the patient. The patient was provided an opportunity to ask questions and all were answered. The patient agreed with the plan and demonstrated an understanding of the instructions.    HLucretia Kern DO   Patient Instructions   -Zyrtec once daily  -I sent the medication cream we discussed to your pharmacy:  Meds ordered this encounter  Medications  . triamcinolone cream (KENALOG) 0.1 %    Sig: Apply 1 application topically 2 (two) times daily.  Dispense:  45 g    Refill:  0    Please let us know if you have any questions or concerns regarding this prescription.  I hope you are feeling better soon! Seek care promptly if your symptoms worsen, new concerns arise or you are not improving with treatment.

## 2018-09-25 ENCOUNTER — Ambulatory Visit (INDEPENDENT_AMBULATORY_CARE_PROVIDER_SITE_OTHER): Payer: Medicare Other | Admitting: Surgical

## 2018-09-25 ENCOUNTER — Encounter: Payer: Self-pay | Admitting: Surgical

## 2018-09-25 ENCOUNTER — Encounter (HOSPITAL_BASED_OUTPATIENT_CLINIC_OR_DEPARTMENT_OTHER): Payer: Self-pay | Admitting: *Deleted

## 2018-09-25 ENCOUNTER — Other Ambulatory Visit: Payer: Self-pay

## 2018-09-25 VITALS — BP 166/84 | HR 91 | Temp 97.1°F | Ht 60.0 in | Wt 125.0 lb

## 2018-09-25 DIAGNOSIS — S81001D Unspecified open wound, right knee, subsequent encounter: Secondary | ICD-10-CM

## 2018-09-25 DIAGNOSIS — Z96651 Presence of right artificial knee joint: Secondary | ICD-10-CM

## 2018-09-25 NOTE — Progress Notes (Signed)
Patient ID: [name], female    DOB: 12/31/1945, 73 y.o.   MRN: [id number]  Chief Complaint  Patient presents with  . Pre-op Exam    for excision of (R) knee wound with placement of Acell and Pravena     ICD-10-CM   1. S/P right TKA  Z96.651   2. Open wound of right knee, subsequent encounter  S81.001D    History of Present Illness: [name] is a 73 y.o.  female  with a history of right knee wound that developed s/p right TKA in July.  She presents for preoperative evaluation for upcoming procedure, excision of right knee wound with placement of Acell and Prevena Vac, scheduled for 10/01/18 with Dr. Marla Roe.  The patient has not had problems with anesthesia, but does get postoperative nausea vomiting.. No recent colds or illnesses.  No history of DVT/PE.  No family history of DVT/PE.  She is not currently on any anticoagulants.  No fever, chills, nausea, vomiting.  The wound is 3.5 x 2.25 cm.  There is a fair amount of exudate on the surface of the wound.  The surrounding area is slightly erythematous, but does not appear infected.  No purulent drainage.  [name] has a history of postoperative nausea vomiting, Crohn's disease, rheumatoid arthritis, neuropathy, hypertension.    She has recently moved here from [place] to be closer to her children.  Past Medical History: Allergies: Allergies  Allergen Reactions  . Aspirin     Intestinal Bleeding  . Ciprofloxacin Nausea And Vomiting  . Codeine Nausea And Vomiting  . Demerol [Meperidine Hcl] Nausea And Vomiting  . Lactose Intolerance (Gi) Other (See Comments)    Pt has Crohn's   . Latex     Redness and skin peeling  . Other     Nuts-itching in throat  Seeds-stomach issues with chrons    . Septra [Sulfamethoxazole-Trimethoprim] Nausea Only    Current Medications:  Current Outpatient Medications:  .  amoxicillin (AMOXIL) 500 MG capsule, Take 500 mg by mouth 2 (two) times daily as needed (Chrone's flare  up). , Disp: , Rfl:  .  calcium carbonate (OSCAL) 1500 (600 Ca) MG TABS tablet, Take 600 mg of elemental calcium by mouth 2 (two) times daily with a meal., Disp: , Rfl:  .  cetirizine (ZYRTEC) 10 MG tablet, Take 10 mg by mouth daily., Disp: , Rfl:  .  Cholecalciferol (VITAMIN D) 50 MCG (2000 UT) CAPS, Take 2,000 Units by mouth daily., Disp: , Rfl:  .  dicyclomine (BENTYL) 20 MG tablet, Take 20 mg by mouth 4 (four) times daily as needed for spasms., Disp: , Rfl:  .  DULoxetine (CYMBALTA) 60 MG capsule, Take 60 mg by mouth 2 (two) times daily., Disp: , Rfl:  .  lipase/protease/amylase (CREON) 12000 units CPEP capsule, Take 24,000 Units by mouth 3 (three) times daily with meals., Disp: , Rfl:  .  loperamide (IMODIUM) 2 MG capsule, Take 2 mg by mouth 3 (three) times daily as needed for diarrhea or loose stools., Disp: , Rfl:  .  losartan (COZAAR) 50 MG tablet, Take 50 mg by mouth daily., Disp: , Rfl:  .  mesalamine (LIALDA) 1.2 g EC tablet, Take 4.8 g by mouth daily with breakfast., Disp: , Rfl:  .  Multiple Vitamin (MULTIVITAMIN WITH MINERALS) TABS tablet, Take 1 tablet by mouth daily., Disp: , Rfl:  .  omeprazole (PRILOSEC) 40 MG capsule, Take 40 mg by mouth 2 (two) times daily.,  Disp: , Rfl:  .  ondansetron (ZOFRAN) 4 MG tablet, Take 4 mg by mouth every 8 (eight) hours as needed for nausea or vomiting., Disp: , Rfl:  .  triamcinolone cream (KENALOG) 0.1 %, Apply 1 application topically 2 (two) times daily., Disp: 45 g, Rfl: 0  Past Medical Problems: Past Medical History:  Diagnosis Date  . Allergies   . Bruises easily   . Complication of anesthesia   . Crohn's disease ([place])    GI Dr Beulah Gandy in [place], [place]   . Deviated septum   . GERD (gastroesophageal reflux disease)   . Headache    occ   . Hiatal hernia   . HTN (hypertension)    pcp Dr Margaretann Loveless  in [place], [place]   . Neuropathy    fingers,periodically   . Osteoarthritis   . Pancreatic insufficiency   . PONV  (postoperative nausea and vomiting)   . Rheumatoid arthritis ([place])   . Scoliosis   . Squamous cell carcinoma of arm    left   . Whooping cough with pneumonia    2017    Past Surgical History: Past Surgical History:  Procedure Laterality Date  . CATARACT EXTRACTION, BILATERAL  2018   with IOC implant   . COLONOSCOPY    . JOINT REPLACEMENT    . knee fracture     . NASAL FRACTURE SURGERY     needed b/c she was getting frequent sinus infections   . 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 UPPER GASTROINTESTINAL ENDOSCOPY      Social History: Social History   Socioeconomic History  . Marital status: Married    Spouse name: Not on file  . Number of children: Not on file  . Years of education: Not on file  . Highest education level: Not on file  Occupational History  . Not on file  Social Needs  . Financial resource strain: Not on file  . Food insecurity    Worry: Not on file    Inability: Not on file  . Transportation needs    Medical: Not on file    Non-medical: Not on file  Tobacco Use  . Smoking status: Former Smoker    Years: 30.00    Types: Cigarettes    Quit date: 1990    Years since quitting: 30.7  . Smokeless tobacco: Never Used  Substance and Sexual Activity  . Alcohol use: Not Currently  . Drug use: Never  . Sexual activity: Not on file  Lifestyle  . Physical activity    Days per week: Not on file    Minutes per session: Not on file  . Stress: Not on file  Relationships  . Social Herbalist on phone: Not on file    Gets together: Not on file    Attends religious service: Not on file    Active member of club or organization: Not on file    Attends meetings of clubs or organizations: Not on file    Relationship status: Not on file  . Intimate partner violence    Fear of current or ex partner: Not on file     Emotionally abused: Not on file    Physically abused: Not on file    Forced sexual activity: Not on file  Other Topics Concern  . Not on file  Social  History Narrative  . Not on file    Family History: No family history on file.  Review of Systems: Review of Systems  Constitutional: Negative for chills, fever, malaise/fatigue and weight loss.  Respiratory: Negative.   Cardiovascular: Negative for chest pain, palpitations, claudication, leg swelling and PND.  Gastrointestinal: Negative.   Genitourinary: Negative.   Musculoskeletal: Negative.   Skin: Negative for itching and rash.       Positive wound  Neurological: Negative for dizziness, sensory change, focal weakness, weakness and headaches.  Psychiatric/Behavioral: Negative.     Physical Exam: Vital Signs BP (!) 166/84 (BP Location: Left Arm, Patient Position: Sitting, Cuff Size: Normal)   Pulse 91   Temp (!) 97.1 F (36.2 C) (Temporal)   Ht 5' (1.524 m)   Wt 125 lb (56.7 kg)   SpO2 95%   BMI 24.41 kg/m   Physical Exam Constitutional:      General: She is not in acute distress.    Appearance: Normal appearance. She is not ill-appearing, toxic-appearing or diaphoretic.  HENT:     Head: Normocephalic and atraumatic.  Neck:     Musculoskeletal: Normal range of motion and neck supple.  Cardiovascular:     Rate and Rhythm: Normal rate.     Pulses: Normal pulses.     Heart sounds: Normal heart sounds.  Pulmonary:     Effort: Pulmonary effort is normal. No respiratory distress.     Breath sounds: Normal breath sounds. No stridor. No wheezing.  Abdominal:     General: Abdomen is flat. Bowel sounds are normal.     Palpations: Abdomen is soft.  Musculoskeletal: Normal range of motion.        General: Deformity present. No swelling or tenderness.     Right lower leg: No edema.     Left lower leg: No edema.  Skin:    General: Skin is warm and dry.     Capillary Refill: Capillary refill takes less than 2 seconds.      Coloration: Skin is not jaundiced or pale.     Findings: Bruising and lesion present.       Neurological:     General: No focal deficit present.     Mental Status: She is alert and oriented to person, place, and time. Mental status is at baseline.  Psychiatric:        Mood and Affect: Mood normal.        Behavior: Behavior normal.      Assessment/Plan: Mrs. Garman is scheduled for excision of right knee wound with placement of Acell and prevena with Dr. Marla Roe on 10/01/18.  Risks, benefits, and alternatives of procedure discussed, questions answered and consent obtained.    Mrs. Rod Holler is scheduled for her COVID test.  She does not take any anticoagulants, has not had any recent illnesses.  Continue with compression stocking for the legs, multivitamin, vitamin C.  Increase protein intake from now until surgery.  Drink plenty of water.  The risks that can be encountered with and after excision of a skin lesion were discussed and include the following but not limited to these: bleeding, infection, delayed healing, anesthesia risks, skin sensation changes, injury to structures including nerves, blood vessels, and muscles which may be temporary or permanent, allergies to tape, suture materials and glues, blood products, topical preparations or injected agents, skin contour irregularities, skin discoloration and swelling, deep vein thrombosis, cardiac and pulmonary complications, pain, which may persist, persistent pain, recurrence of the lesion, poor healing  of the incision, possible need for revisional surgery or staged procedures.    Electronically signed by: Carola Rhine Shantia Sanford, PA-C 09/25/2018 1:26 PM

## 2018-09-25 NOTE — H&P (View-Only) (Signed)
Patient ID: [name], female    DOB: April 20, 1945, 73 y.o.   MRN: [id number]  Chief Complaint  Patient presents with  . Pre-op Exam    for excision of (R) knee wound with placement of Acell and Pravena     ICD-10-CM   1. S/P right TKA  Z96.651   2. Open wound of right knee, subsequent encounter  S81.001D    History of Present Illness: [name] is a 73 y.o.  female  with a history of right knee wound that developed s/p right TKA in July.  She presents for preoperative evaluation for upcoming procedure, excision of right knee wound with placement of Acell and Prevena Vac, scheduled for 10/01/18 with Dr. Marla Roe.  The patient has not had problems with anesthesia, but does get postoperative nausea vomiting.. No recent colds or illnesses.  No history of DVT/PE.  No family history of DVT/PE.  She is not currently on any anticoagulants.  No fever, chills, nausea, vomiting.  The wound is 3.5 x 2.25 cm.  There is a fair amount of exudate on the surface of the wound.  The surrounding area is slightly erythematous, but does not appear infected.  No purulent drainage.  [name] has a history of postoperative nausea vomiting, Crohn's disease, rheumatoid arthritis, neuropathy, hypertension.    She has recently moved here from [place] to be closer to her children.  Past Medical History: Allergies: Allergies  Allergen Reactions  . Aspirin     Intestinal Bleeding  . Ciprofloxacin Nausea And Vomiting  . Codeine Nausea And Vomiting  . Demerol [Meperidine Hcl] Nausea And Vomiting  . Lactose Intolerance (Gi) Other (See Comments)    Pt has Crohn's   . Latex     Redness and skin peeling  . Other     Nuts-itching in throat  Seeds-stomach issues with chrons    . Septra [Sulfamethoxazole-Trimethoprim] Nausea Only    Current Medications:  Current Outpatient Medications:  .  amoxicillin (AMOXIL) 500 MG capsule, Take 500 mg by mouth 2 (two) times daily as needed (Chrone's flare  up). , Disp: , Rfl:  .  calcium carbonate (OSCAL) 1500 (600 Ca) MG TABS tablet, Take 600 mg of elemental calcium by mouth 2 (two) times daily with a meal., Disp: , Rfl:  .  cetirizine (ZYRTEC) 10 MG tablet, Take 10 mg by mouth daily., Disp: , Rfl:  .  Cholecalciferol (VITAMIN D) 50 MCG (2000 UT) CAPS, Take 2,000 Units by mouth daily., Disp: , Rfl:  .  dicyclomine (BENTYL) 20 MG tablet, Take 20 mg by mouth 4 (four) times daily as needed for spasms., Disp: , Rfl:  .  DULoxetine (CYMBALTA) 60 MG capsule, Take 60 mg by mouth 2 (two) times daily., Disp: , Rfl:  .  lipase/protease/amylase (CREON) 12000 units CPEP capsule, Take 24,000 Units by mouth 3 (three) times daily with meals., Disp: , Rfl:  .  loperamide (IMODIUM) 2 MG capsule, Take 2 mg by mouth 3 (three) times daily as needed for diarrhea or loose stools., Disp: , Rfl:  .  losartan (COZAAR) 50 MG tablet, Take 50 mg by mouth daily., Disp: , Rfl:  .  mesalamine (LIALDA) 1.2 g EC tablet, Take 4.8 g by mouth daily with breakfast., Disp: , Rfl:  .  Multiple Vitamin (MULTIVITAMIN WITH MINERALS) TABS tablet, Take 1 tablet by mouth daily., Disp: , Rfl:  .  omeprazole (PRILOSEC) 40 MG capsule, Take 40 mg by mouth 2 (two) times daily.,  Disp: , Rfl:  .  ondansetron (ZOFRAN) 4 MG tablet, Take 4 mg by mouth every 8 (eight) hours as needed for nausea or vomiting., Disp: , Rfl:  .  triamcinolone cream (KENALOG) 0.1 %, Apply 1 application topically 2 (two) times daily., Disp: 45 g, Rfl: 0  Past Medical Problems: Past Medical History:  Diagnosis Date  . Allergies   . Bruises easily   . Complication of anesthesia   . Crohn's disease ([place])    GI Dr Beulah Gandy in [place], [place]   . Deviated septum   . GERD (gastroesophageal reflux disease)   . Headache    occ   . Hiatal hernia   . HTN (hypertension)    pcp Dr Margaretann Loveless  in [place], [place]   . Neuropathy    fingers,periodically   . Osteoarthritis   . Pancreatic insufficiency   . PONV  (postoperative nausea and vomiting)   . Rheumatoid arthritis ([place])   . Scoliosis   . Squamous cell carcinoma of arm    left   . Whooping cough with pneumonia    2017    Past Surgical History: Past Surgical History:  Procedure Laterality Date  . CATARACT EXTRACTION, BILATERAL  2018   with IOC implant   . COLONOSCOPY    . JOINT REPLACEMENT    . knee fracture     . NASAL FRACTURE SURGERY     needed b/c she was getting frequent sinus infections   . 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 UPPER GASTROINTESTINAL ENDOSCOPY      Social History: Social History   Socioeconomic History  . Marital status: Married    Spouse name: Not on file  . Number of children: Not on file  . Years of education: Not on file  . Highest education level: Not on file  Occupational History  . Not on file  Social Needs  . Financial resource strain: Not on file  . Food insecurity    Worry: Not on file    Inability: Not on file  . Transportation needs    Medical: Not on file    Non-medical: Not on file  Tobacco Use  . Smoking status: Former Smoker    Years: 30.00    Types: Cigarettes    Quit date: 1990    Years since quitting: 30.7  . Smokeless tobacco: Never Used  Substance and Sexual Activity  . Alcohol use: Not Currently  . Drug use: Never  . Sexual activity: Not on file  Lifestyle  . Physical activity    Days per week: Not on file    Minutes per session: Not on file  . Stress: Not on file  Relationships  . Social Herbalist on phone: Not on file    Gets together: Not on file    Attends religious service: Not on file    Active member of club or organization: Not on file    Attends meetings of clubs or organizations: Not on file    Relationship status: Not on file  . Intimate partner violence    Fear of current or ex partner: Not on file     Emotionally abused: Not on file    Physically abused: Not on file    Forced sexual activity: Not on file  Other Topics Concern  . Not on file  Social  History Narrative  . Not on file    Family History: No family history on file.  Review of Systems: Review of Systems  Constitutional: Negative for chills, fever, malaise/fatigue and weight loss.  Respiratory: Negative.   Cardiovascular: Negative for chest pain, palpitations, claudication, leg swelling and PND.  Gastrointestinal: Negative.   Genitourinary: Negative.   Musculoskeletal: Negative.   Skin: Negative for itching and rash.       Positive wound  Neurological: Negative for dizziness, sensory change, focal weakness, weakness and headaches.  Psychiatric/Behavioral: Negative.     Physical Exam: Vital Signs BP (!) 166/84 (BP Location: Left Arm, Patient Position: Sitting, Cuff Size: Normal)   Pulse 91   Temp (!) 97.1 F (36.2 C) (Temporal)   Ht 5' (1.524 m)   Wt 125 lb (56.7 kg)   SpO2 95%   BMI 24.41 kg/m   Physical Exam Constitutional:      General: She is not in acute distress.    Appearance: Normal appearance. She is not ill-appearing, toxic-appearing or diaphoretic.  HENT:     Head: Normocephalic and atraumatic.  Neck:     Musculoskeletal: Normal range of motion and neck supple.  Cardiovascular:     Rate and Rhythm: Normal rate.     Pulses: Normal pulses.     Heart sounds: Normal heart sounds.  Pulmonary:     Effort: Pulmonary effort is normal. No respiratory distress.     Breath sounds: Normal breath sounds. No stridor. No wheezing.  Abdominal:     General: Abdomen is flat. Bowel sounds are normal.     Palpations: Abdomen is soft.  Musculoskeletal: Normal range of motion.        General: Deformity present. No swelling or tenderness.     Right lower leg: No edema.     Left lower leg: No edema.  Skin:    General: Skin is warm and dry.     Capillary Refill: Capillary refill takes less than 2 seconds.      Coloration: Skin is not jaundiced or pale.     Findings: Bruising and lesion present.       Neurological:     General: No focal deficit present.     Mental Status: She is alert and oriented to person, place, and time. Mental status is at baseline.  Psychiatric:        Mood and Affect: Mood normal.        Behavior: Behavior normal.      Assessment/Plan: Mrs. Eustice is scheduled for excision of right knee wound with placement of Acell and prevena with Dr. Marla Roe on 10/01/18.  Risks, benefits, and alternatives of procedure discussed, questions answered and consent obtained.    Mrs. Rod Holler is scheduled for her COVID test.  She does not take any anticoagulants, has not had any recent illnesses.  Continue with compression stocking for the legs, multivitamin, vitamin C.  Increase protein intake from now until surgery.  Drink plenty of water.  The risks that can be encountered with and after excision of a skin lesion were discussed and include the following but not limited to these: bleeding, infection, delayed healing, anesthesia risks, skin sensation changes, injury to structures including nerves, blood vessels, and muscles which may be temporary or permanent, allergies to tape, suture materials and glues, blood products, topical preparations or injected agents, skin contour irregularities, skin discoloration and swelling, deep vein thrombosis, cardiac and pulmonary complications, pain, which may persist, persistent pain, recurrence of the lesion, poor healing  of the incision, possible need for revisional surgery or staged procedures.    Electronically signed by: Carola Rhine Sierra Bissonette, PA-C 09/25/2018 1:26 PM

## 2018-09-28 ENCOUNTER — Other Ambulatory Visit (HOSPITAL_COMMUNITY)
Admission: RE | Admit: 2018-09-28 | Discharge: 2018-09-28 | Disposition: A | Discharge status: Home / Self Care | Payer: Medicare Other | Source: Ambulatory Visit | Attending: Plastic Surgery | Admitting: Plastic Surgery

## 2018-09-28 DIAGNOSIS — S81001A Unspecified open wound, right knee, initial encounter: Secondary | ICD-10-CM | POA: Insufficient documentation

## 2018-09-28 DIAGNOSIS — Z20828 Contact with and (suspected) exposure to other viral communicable diseases: Secondary | ICD-10-CM | POA: Diagnosis not present

## 2018-09-28 DIAGNOSIS — Z01812 Encounter for preprocedural laboratory examination: Secondary | ICD-10-CM | POA: Insufficient documentation

## 2018-09-30 ENCOUNTER — Telehealth: Payer: Self-pay

## 2018-09-30 LAB — NOVEL CORONAVIRUS, NAA (HOSP ORDER, SEND-OUT TO REF LAB; TAT 18-24 HRS): SARS-CoV-2, NAA: NOT DETECTED

## 2018-09-30 NOTE — Telephone Encounter (Signed)
Received call from patient who is having surgery tomorrow. Patient is asking what kind of anesthesia they will be using. Patient asked for specific name of drug. Please call and advise

## 2018-09-30 NOTE — Telephone Encounter (Addendum)
Called and [facility] @ 11:57am)  asking the patient to RTC regarding the message below.//AB/CMA

## 2018-10-01 ENCOUNTER — Encounter (HOSPITAL_BASED_OUTPATIENT_CLINIC_OR_DEPARTMENT_OTHER): Payer: Self-pay | Admitting: Anesthesiology

## 2018-10-01 ENCOUNTER — Ambulatory Visit (HOSPITAL_BASED_OUTPATIENT_CLINIC_OR_DEPARTMENT_OTHER): Payer: Medicare Other | Admitting: Anesthesiology

## 2018-10-01 ENCOUNTER — Ambulatory Visit (HOSPITAL_BASED_OUTPATIENT_CLINIC_OR_DEPARTMENT_OTHER)
Admission: RE | Admit: 2018-10-01 | Discharge: 2018-10-01 | Disposition: A | Discharge status: Home / Self Care | Payer: Medicare Other | Attending: Plastic Surgery | Admitting: Plastic Surgery

## 2018-10-01 ENCOUNTER — Other Ambulatory Visit: Payer: Self-pay

## 2018-10-01 ENCOUNTER — Ambulatory Visit: Payer: Medicare Other

## 2018-10-01 ENCOUNTER — Encounter (HOSPITAL_BASED_OUTPATIENT_CLINIC_OR_DEPARTMENT_OTHER)
Admission: RE | Disposition: A | Discharge status: Home / Self Care | Payer: Self-pay | Source: Home / Self Care | Attending: Plastic Surgery

## 2018-10-01 DIAGNOSIS — I10 Essential (primary) hypertension: Secondary | ICD-10-CM | POA: Insufficient documentation

## 2018-10-01 DIAGNOSIS — Z87891 Personal history of nicotine dependence: Secondary | ICD-10-CM | POA: Diagnosis not present

## 2018-10-01 DIAGNOSIS — S81001A Unspecified open wound, right knee, initial encounter: Secondary | ICD-10-CM | POA: Diagnosis present

## 2018-10-01 DIAGNOSIS — Z791 Long term (current) use of non-steroidal anti-inflammatories (NSAID): Secondary | ICD-10-CM | POA: Diagnosis not present

## 2018-10-01 DIAGNOSIS — Y831 Surgical operation with implant of artificial internal device as the cause of abnormal reaction of the patient, or of later complication, without mention of misadventure at the time of the procedure: Secondary | ICD-10-CM | POA: Diagnosis not present

## 2018-10-01 DIAGNOSIS — K219 Gastro-esophageal reflux disease without esophagitis: Secondary | ICD-10-CM | POA: Insufficient documentation

## 2018-10-01 DIAGNOSIS — Z79899 Other long term (current) drug therapy: Secondary | ICD-10-CM | POA: Insufficient documentation

## 2018-10-01 DIAGNOSIS — Z85828 Personal history of other malignant neoplasm of skin: Secondary | ICD-10-CM | POA: Diagnosis not present

## 2018-10-01 DIAGNOSIS — Z96651 Presence of right artificial knee joint: Secondary | ICD-10-CM | POA: Diagnosis not present

## 2018-10-01 HISTORY — DX: Other specified postprocedural states: Z98.890

## 2018-10-01 HISTORY — PX: DEBRIDEMENT AND CLOSURE WOUND: SHX5614

## 2018-10-01 HISTORY — DX: Other complications of anesthesia, initial encounter: T88.59XA

## 2018-10-01 HISTORY — DX: Nausea with vomiting, unspecified: R11.2

## 2018-10-01 SURGERY — DEBRIDEMENT, WOUND, WITH CLOSURE
Anesthesia: General | Site: Knee | Laterality: Right

## 2018-10-01 MED ORDER — LIDOCAINE HCL (CARDIAC) PF 100 MG/5ML IV SOSY
PREFILLED_SYRINGE | INTRAVENOUS | Status: DC | PRN
  Administered 2018-10-01: 30 mg via INTRAVENOUS

## 2018-10-01 MED ORDER — HYDROMORPHONE HCL 1 MG/ML IJ SOLN: 0.2500 mg | INTRAMUSCULAR | Status: DC | PRN

## 2018-10-01 MED ORDER — MIDAZOLAM HCL 2 MG/2ML IJ SOLN
INTRAMUSCULAR | Status: AC
  Filled 2018-10-01: qty 2

## 2018-10-01 MED ORDER — FENTANYL CITRATE (PF) 100 MCG/2ML IJ SOLN: 12.5000 ug | INTRAMUSCULAR | Status: DC | PRN

## 2018-10-01 MED ORDER — HYDROCODONE-ACETAMINOPHEN 5-325 MG PO TABS: 1.0000 | ORAL_TABLET | Freq: Three times a day (TID) | ORAL | 0 refills | Status: AC | PRN

## 2018-10-01 MED ORDER — MIDAZOLAM HCL 2 MG/2ML IJ SOLN: 1.0000 mg | INTRAMUSCULAR | Status: DC | PRN

## 2018-10-01 MED ORDER — CHLORHEXIDINE GLUCONATE CLOTH 2 % EX PADS: 6.0000 | MEDICATED_PAD | Freq: Once | CUTANEOUS | Status: DC

## 2018-10-01 MED ORDER — SUCCINYLCHOLINE CHLORIDE 200 MG/10ML IV SOSY
PREFILLED_SYRINGE | INTRAVENOUS | Status: AC
  Filled 2018-10-01: qty 10

## 2018-10-01 MED ORDER — LACTATED RINGERS IV SOLN
INTRAVENOUS | Status: DC
Start: 2018-10-01 — End: 2018-10-01
  Administered 2018-10-01 (×2): via INTRAVENOUS

## 2018-10-01 MED ORDER — ONDANSETRON HCL 4 MG/2ML IJ SOLN
INTRAMUSCULAR | Status: AC
  Filled 2018-10-01: qty 2

## 2018-10-01 MED ORDER — PROPOFOL 10 MG/ML IV BOLUS
INTRAVENOUS | Status: DC | PRN
  Administered 2018-10-01: 100 mg via INTRAVENOUS

## 2018-10-01 MED ORDER — DEXAMETHASONE SODIUM PHOSPHATE 4 MG/ML IJ SOLN
INTRAMUSCULAR | Status: DC | PRN
  Administered 2018-10-01: 4 mg via INTRAVENOUS

## 2018-10-01 MED ORDER — DEXAMETHASONE SODIUM PHOSPHATE 10 MG/ML IJ SOLN
INTRAMUSCULAR | Status: AC
  Filled 2018-10-01: qty 1

## 2018-10-01 MED ORDER — CEFAZOLIN SODIUM-DEXTROSE 2-4 GM/100ML-% IV SOLN
2.0000 g | INTRAVENOUS | Status: AC
  Administered 2018-10-01: 2 g via INTRAVENOUS

## 2018-10-01 MED ORDER — FENTANYL CITRATE (PF) 100 MCG/2ML IJ SOLN
50.0000 ug | INTRAMUSCULAR | Status: DC | PRN
  Administered 2018-10-01 (×2): 50 ug via INTRAVENOUS

## 2018-10-01 MED ORDER — ACETAMINOPHEN 325 MG PO TABS: 650.0000 mg | ORAL_TABLET | ORAL | Status: DC | PRN

## 2018-10-01 MED ORDER — ACETAMINOPHEN 650 MG RE SUPP: 650.0000 mg | RECTAL | Status: DC | PRN

## 2018-10-01 MED ORDER — DIPHENHYDRAMINE HCL 50 MG/ML IJ SOLN
INTRAMUSCULAR | Status: AC
  Filled 2018-10-01: qty 1

## 2018-10-01 MED ORDER — LIDOCAINE-EPINEPHRINE 1 %-1:100000 IJ SOLN
INTRAMUSCULAR | Status: DC | PRN
  Administered 2018-10-01: 4 mL

## 2018-10-01 MED ORDER — CEFAZOLIN SODIUM-DEXTROSE 2-4 GM/100ML-% IV SOLN
INTRAVENOUS | Status: AC
  Filled 2018-10-01: qty 100

## 2018-10-01 MED ORDER — SODIUM CHLORIDE 0.9% FLUSH: 3.0000 mL | INTRAVENOUS | Status: DC | PRN

## 2018-10-01 MED ORDER — DIPHENHYDRAMINE HCL 50 MG/ML IJ SOLN
25.0000 mg | Freq: Once | INTRAMUSCULAR | Status: AC
  Administered 2018-10-01: 25 mg via INTRAVENOUS

## 2018-10-01 MED ORDER — SODIUM CHLORIDE 0.9% FLUSH: 3.0000 mL | Freq: Two times a day (BID) | INTRAVENOUS | Status: DC

## 2018-10-01 MED ORDER — EPHEDRINE 5 MG/ML INJ
INTRAVENOUS | Status: AC
  Filled 2018-10-01: qty 10

## 2018-10-01 MED ORDER — SODIUM CHLORIDE 0.9 % IV SOLN
INTRAVENOUS | Status: DC | PRN
  Administered 2018-10-01: 500 mL

## 2018-10-01 MED ORDER — ONDANSETRON HCL 4 MG/2ML IJ SOLN: 4.0000 mg | Freq: Once | INTRAMUSCULAR | Status: DC | PRN

## 2018-10-01 MED ORDER — PROPOFOL 500 MG/50ML IV EMUL
INTRAVENOUS | Status: AC
  Filled 2018-10-01: qty 50

## 2018-10-01 MED ORDER — LIDOCAINE 2% (20 MG/ML) 5 ML SYRINGE
INTRAMUSCULAR | Status: AC
  Filled 2018-10-01: qty 5

## 2018-10-01 MED ORDER — PHENYLEPHRINE HCL (PRESSORS) 10 MG/ML IV SOLN
INTRAVENOUS | Status: DC | PRN
  Administered 2018-10-01: 40 ug via INTRAVENOUS

## 2018-10-01 MED ORDER — SCOPOLAMINE 1 MG/3DAYS TD PT72: 1.0000 | MEDICATED_PATCH | Freq: Once | TRANSDERMAL | Status: DC

## 2018-10-01 MED ORDER — ONDANSETRON HCL 4 MG/2ML IJ SOLN
INTRAMUSCULAR | Status: DC | PRN
  Administered 2018-10-01: 4 mg via INTRAVENOUS

## 2018-10-01 MED ORDER — SODIUM CHLORIDE 0.9 % IV SOLN: 250.0000 mL | INTRAVENOUS | Status: DC | PRN

## 2018-10-01 MED ORDER — EPHEDRINE SULFATE 50 MG/ML IJ SOLN
INTRAMUSCULAR | Status: DC | PRN
  Administered 2018-10-01: 15 mg via INTRAVENOUS

## 2018-10-01 MED ORDER — PHENYLEPHRINE 40 MCG/ML (10ML) SYRINGE FOR IV PUSH (FOR BLOOD PRESSURE SUPPORT)
PREFILLED_SYRINGE | INTRAVENOUS | Status: AC
  Filled 2018-10-01: qty 10

## 2018-10-01 MED ORDER — FENTANYL CITRATE (PF) 100 MCG/2ML IJ SOLN
INTRAMUSCULAR | Status: AC
  Filled 2018-10-01: qty 2

## 2018-10-01 SURGICAL SUPPLY — 67 items
BINDER BREAST 3XL (GAUZE/BANDAGES/DRESSINGS) IMPLANT
BINDER BREAST LRG (GAUZE/BANDAGES/DRESSINGS) IMPLANT
BINDER BREAST MEDIUM (GAUZE/BANDAGES/DRESSINGS) IMPLANT
BINDER BREAST XLRG (GAUZE/BANDAGES/DRESSINGS) IMPLANT
BINDER BREAST XXLRG (GAUZE/BANDAGES/DRESSINGS) IMPLANT
BLADE CLIPPER SURG (BLADE) IMPLANT
BLADE HEX COATED 2.75 (ELECTRODE) ×2 IMPLANT
BLADE SURG 10 STRL SS (BLADE) IMPLANT
BLADE SURG 15 STRL LF DISP TIS (BLADE) ×1 IMPLANT
BLADE SURG 15 STRL SS (BLADE) ×1
BNDG ELASTIC 4X5.8 VLCR STR LF (GAUZE/BANDAGES/DRESSINGS) ×4 IMPLANT
BNDG GAUZE ELAST 4 BULKY (GAUZE/BANDAGES/DRESSINGS) ×2 IMPLANT
CHLORAPREP W/TINT 26 (MISCELLANEOUS) IMPLANT
COVER BACK TABLE REUSABLE LG (DRAPES) ×2 IMPLANT
COVER MAYO STAND REUSABLE (DRAPES) ×2 IMPLANT
COVER WAND RF STERILE (DRAPES) IMPLANT
DECANTER SPIKE VIAL GLASS SM (MISCELLANEOUS) IMPLANT
DERMABOND ADVANCED (GAUZE/BANDAGES/DRESSINGS)
DERMABOND ADVANCED .7 DNX12 (GAUZE/BANDAGES/DRESSINGS) IMPLANT
DRAIN CHANNEL 19F RND (DRAIN) IMPLANT
DRAPE HALF SHEET 70X43 (DRAPES) ×2 IMPLANT
DRAPE INCISE IOBAN 66X45 STRL (DRAPES) ×2 IMPLANT
DRAPE LAPAROSCOPIC ABDOMINAL (DRAPES) IMPLANT
DRAPE LAPAROTOMY 100X72 PEDS (DRAPES) IMPLANT
DRAPE SURG 17X23 STRL (DRAPES) IMPLANT
DRAPE U-SHAPE 76X120 STRL (DRAPES) ×2 IMPLANT
DRESSING DUODERM 4X4 STERILE (GAUZE/BANDAGES/DRESSINGS) IMPLANT
DRSG ADAPTIC 3X8 NADH LF (GAUZE/BANDAGES/DRESSINGS) IMPLANT
DRSG EMULSION OIL 3X3 NADH (GAUZE/BANDAGES/DRESSINGS) ×2 IMPLANT
DRSG PAD ABDOMINAL 8X10 ST (GAUZE/BANDAGES/DRESSINGS) IMPLANT
DRSG TEGADERM 4X10 (GAUZE/BANDAGES/DRESSINGS) IMPLANT
DRSG TELFA 3X8 NADH (GAUZE/BANDAGES/DRESSINGS) IMPLANT
ELECT REM PT RETURN 9FT ADLT (ELECTROSURGICAL) ×2
ELECTRODE REM PT RTRN 9FT ADLT (ELECTROSURGICAL) ×1 IMPLANT
EVACUATOR SILICONE 100CC (DRAIN) IMPLANT
GAUZE SPONGE 4X4 12PLY STRL (GAUZE/BANDAGES/DRESSINGS) IMPLANT
GAUZE XEROFORM 5X9 LF (GAUZE/BANDAGES/DRESSINGS) IMPLANT
GLOVE BIO SURGEON STRL SZ 6.5 (GLOVE) IMPLANT
GLOVE SURG SS PI 6.5 STRL IVOR (GLOVE) ×6 IMPLANT
GLOVE SURG SS PI 7.0 STRL IVOR (GLOVE) ×4 IMPLANT
GOWN STRL REUS W/ TWL LRG LVL3 (GOWN DISPOSABLE) ×2 IMPLANT
GOWN STRL REUS W/TWL LRG LVL3 (GOWN DISPOSABLE) ×4 IMPLANT
KIT DRSG PREVENA PLUS 7DAY 125 (MISCELLANEOUS) ×2 IMPLANT
MATRIX WOUND 3-LAYER 5X5 (Tissue) ×2 IMPLANT
MICROMATRIX 500MG (Tissue) ×2 IMPLANT
NEEDLE HYPO 25X1 1.5 SAFETY (NEEDLE) ×2 IMPLANT
NS IRRIG 1000ML POUR BTL (IV SOLUTION) ×2 IMPLANT
PACK BASIN DAY SURGERY FS (CUSTOM PROCEDURE TRAY) ×2 IMPLANT
PENCIL BUTTON HOLSTER BLD 10FT (ELECTRODE) ×2 IMPLANT
SLEEVE SCD COMPRESS KNEE MED (MISCELLANEOUS) ×2 IMPLANT
SOLUTION PARTIC MCRMTRX 500MG (Tissue) ×1 IMPLANT
SPONGE LAP 18X18 RF (DISPOSABLE) ×2 IMPLANT
STAPLER VISISTAT 35W (STAPLE) IMPLANT
SUT MNCRL AB 3-0 PS2 18 (SUTURE) IMPLANT
SUT MNCRL AB 4-0 PS2 18 (SUTURE) IMPLANT
SUT MON AB 5-0 PS2 18 (SUTURE) IMPLANT
SUT SILK 3 0 PS 1 (SUTURE) IMPLANT
SUT VIC AB 5-0 PS2 18 (SUTURE) ×2 IMPLANT
SWAB COLLECTION DEVICE MRSA (MISCELLANEOUS) IMPLANT
SWAB CULTURE ESWAB REG 1ML (MISCELLANEOUS) IMPLANT
SYR BULB IRRIGATION 50ML (SYRINGE) ×2 IMPLANT
SYR CONTROL 10ML LL (SYRINGE) ×2 IMPLANT
TOWEL GREEN STERILE FF (TOWEL DISPOSABLE) ×4 IMPLANT
TRAY DSU PREP LF (CUSTOM PROCEDURE TRAY) ×2 IMPLANT
TUBE CONNECTING 20X1/4 (TUBING) ×2 IMPLANT
UNDERPAD 30X36 HEAVY ABSORB (UNDERPADS AND DIAPERS) ×2 IMPLANT
YANKAUER SUCT BULB TIP NO VENT (SUCTIONS) ×2 IMPLANT

## 2018-10-01 NOTE — Addendum Note (Signed)
Addended by: Wallace Going on: 10/01/2018 11:56 AM   Modules accepted: Orders

## 2018-10-01 NOTE — Discharge Instructions (Signed)
Guide to Wound Care  Proper wound care may reduce the risk of infection, improve healing rates, and limit scarring.  This is a general guide to help care for and manage wounds treated with MicroMatrix or Cytal Wound Matrix.   Dressing Changes The frequency of dressing changes can vary based on which product was applied, the size of the wound, or the amount of wound drainage. Dressing inspections are recommended, at least weekly.   If you have a Wound VAC it will be changed in one week after the first time it is applied.  Then it will be changed once or twice a week.   If you don't have a Wound VAC, then place KY gel on the wound daily and cover with gauze.  Dressing Types Primary Dressing:  Non-adherent dressing goes directly over wounds being treated with the powder or sheet (MicroMatrix and/or Cytal).  Secondary Dressing:  Secures the primary dressing in place and provides extra protection, compression, and absorption.  1. Wash Hands - To help decrease the risk of infection, caregivers should wash their hands for a minimum of 20 seconds and may use medical gloves.   2. Remove the Dressings - Avoid removing product from the wound by carefully removing the applicable dressing(s) at the time points recommended above, or as recommended by the treating physician.  Expected Color and Odor:  It is entirely normal for the wound to have an unpleasant odor and to form a caramel-colored gel as the product absorbs into the wound. It is  important to leave this gel on the wound site.  3. Clean the Wound - Use clean water or saline to gently rinse the wound surface and remove any excess discharge that may be present on the wound. Do not wipe off any of the caramel-colored gel on the wound. Protocol for cleaning the wound may vary by facility. If you are unsure what to do, ask the treating physician.  4. Inspect the Wound - Proper wound care requires accurate and clinically relevant wound assessment.  Protocols for inspecting the wound may vary by facility, but it is important to  [id number] the length and width.  What to look out for:  Large or increased amount of drainage   Surrounding skin is red or hot to touch   Increased pain in or around the wound   Flu-like symptoms, fatigue, decreased appetite, fever   Hard, crusty wound surface with black or brown coloring  5. Apply New Dressings - Dressings should cover the entire wound and be suitable for maintaining a moist wound environment. Refer to the back of this guide for more information.  Maintain a Hydrated Wound Area While hydration protocols may vary by facility, it is important to keep the wound area moist throughout the healing process. If the wound appears to be dry during dressing changes, select a dressing that will hydrate the wound and maintain that ideal moist environment. If you are unsure what to do, ask the treating physician.  Remodeling Process Every patient heals differently, and no two cases are the same. The size and location of the wound, product type and layering configurations, and general patient health all contribute to how quickly a wound will heal.  While many factors can influence the rate at which the product absorbs, the following can be used as a general guide.   THINGS TO DO: Refrain from smoking High protein diet and limit carbohydrates and suger Protect the wound from trauma Protect the dressing  Micromatrix powder  Cytal Sheet            Sorbact dressing     Post Anesthesia Home Care Instructions  Activity: Get plenty of rest for the remainder of the day. A responsible individual must stay with you for 24 hours following the procedure.  For the next 24 hours, DO NOT: -Drive a car -Paediatric nurse -Drink alcoholic beverages -Take any medication unless instructed by your physician -Make any legal decisions or sign important papers.  Meals: Start with liquid foods such as  gelatin or soup. Progress to regular foods as tolerated. Avoid greasy, spicy, heavy foods. If nausea and/or vomiting occur, drink only clear liquids until the nausea and/or vomiting subsides. Call your physician if vomiting continues.  Special Instructions/Symptoms: Your throat may feel dry or sore from the anesthesia or the breathing tube placed in your throat during surgery. If this causes discomfort, gargle with warm salt water. The discomfort should disappear within 24 hours.  If you had a scopolamine patch placed behind your ear for the management of post- operative nausea and/or vomiting:  1. The medication in the patch is effective for 72 hours, after which it should be removed.  Wrap patch in a tissue and discard in the trash. Wash hands thoroughly with soap and water. 2. You may remove the patch earlier than 72 hours if you experience unpleasant side effects which may include dry mouth, dizziness or visual disturbances. 3. Avoid touching the patch. Wash your hands with soap and water after contact with the patch.

## 2018-10-01 NOTE — Anesthesia Postprocedure Evaluation (Signed)
Anesthesia Post Note  Patient: Programmer, multimedia  Procedure(s) Performed: Excision of right knee wound with placement of ACell and Pravena (Right Knee)     Patient location during evaluation: PACU Anesthesia Type: General Level of consciousness: awake and alert and oriented Pain management: pain level controlled Vital Signs Assessment: post-procedure vital signs reviewed and stable Respiratory status: spontaneous breathing, nonlabored ventilation and respiratory function stable Cardiovascular status: blood pressure returned to baseline and stable Postop Assessment: no apparent nausea or vomiting Anesthetic complications: no    Last Vitals:  Vitals:   10/01/18 1200 10/01/18 1215  BP: (!) 167/94 (!) 157/63  Pulse: (!) 103 96  Resp: 16 13  Temp: 36.6 C   SpO2: 96% 100%    Last Pain:  Vitals:   10/01/18 1215  TempSrc:   PainSc: 0-No pain        RLE Motor Response: Purposeful movement (10/01/18 1215) RLE Sensation: Full sensation;No pain (10/01/18 1215)      Tytiana Coles A.

## 2018-10-01 NOTE — Anesthesia Procedure Notes (Signed)
Procedure Name: LMA Insertion Date/Time: 10/01/2018 11:10 AM Performed by: Willa Frater, CRNA Pre-anesthesia Checklist: Patient identified, Emergency Drugs available, Suction available and Patient being monitored Patient Re-evaluated:Patient Re-evaluated prior to induction Oxygen Delivery Method: Circle system utilized Preoxygenation: Pre-oxygenation with 100% oxygen Induction Type: IV induction Ventilation: Mask ventilation without difficulty LMA: LMA inserted LMA Size: 3.0 Number of attempts: 1 Airway Equipment and Method: Bite block Placement Confirmation: positive ETCO2 Tube secured with: Tape Dental Injury: Teeth and Oropharynx as per pre-operative assessment

## 2018-10-01 NOTE — Op Note (Signed)
DATE OF OPERATION: 10/01/2018  LOCATION: [facility]  PREOPERATIVE DIAGNOSIS: Right knee wound  POSTOPERATIVE DIAGNOSIS: Same  PROCEDURE:  Excision of right knee wound 30 x 40 x 5 mm skin and soft tissue Placement of Acell 500 mg powder and 5 x 5 cm sheet Placement of Prevena VAC dressing  SURGEON: Paulino Cork Sanger Soren Pigman, DO  ASSISTANT: Bonita Cox, RNFA  EBL: 10 cc  CONDITION: Stable  COMPLICATIONS: None  INDICATION: The patient, [name], is a 73 y.o. female born on 1946-01-07, is here for treatment of a right knee wound.   PROCEDURE DETAILS:  The patient was seen prior to surgery and marked.  The IV antibiotics were given. The patient was taken to the operating room and given a general anesthetic. A standard time out was performed and all information was confirmed by those in the room. SCD was placed on the left leg.   The leg was prepped and draped.  The #15 blade was used to excise the skin and soft tissue of the 30 x 40 mm wound on the right knee.  The knee was irrigated with antibiotic solution.  Hemostasis was achieved with pressure and a local with epinepherine.  All of the acell powder and sheet was applied and secured with the 5-0 Vicryl.  The adaptic was sutured in place.  The prevena vac was applied and there was an excellent seal.  The leg was wrapped with kerlex and an ace wrap.  The patient was allowed to wake up and taken to recovery room in stable condition at the end of the case. The family was notified at the end of the case.   The RNFA assisted throughout the case.  The RNFA was essential in retraction and counter traction when needed to make the case progress smoothly.  This retraction and assistance made it possible to see the tissue plans for the procedure.  The assistance was needed for blood control, tissue re-approximation and assisted with closure of the incision site.

## 2018-10-01 NOTE — Interval H&P Note (Signed)
History and Physical Interval Note:  10/01/2018 10:30 AM  [name]  has presented today for surgery, with the diagnosis of Open wound of right knee.  The various methods of treatment have been discussed with the patient and family. After consideration of risks, benefits and other options for treatment, the patient has consented to  Procedure(s) with comments: Excision of right knee wound with placement of ACell and Pravena (Right) - 30 min as a surgical intervention.  The patient's history has been reviewed, patient examined, no change in status, stable for surgery.  I have reviewed the patient's chart and labs.  Questions were answered to the patient's satisfaction.     Loel Lofty Keyoni Lapinski

## 2018-10-01 NOTE — Anesthesia Preprocedure Evaluation (Signed)
Anesthesia Evaluation  Patient identified by MRN, date of birth, ID band Patient awake    Reviewed: Allergy & Precautions, NPO status , Patient's Chart, lab work & pertinent test results  History of Anesthesia Complications (+) PONV and history of anesthetic complications  Airway Mallampati: II  TM Distance: >3 FB Neck ROM: Full    Dental no notable dental hx. (+) Teeth Intact   Pulmonary pneumonia, resolved, former smoker,    Pulmonary exam normal breath sounds clear to auscultation       Cardiovascular hypertension, Pt. on medications Normal cardiovascular exam Rhythm:Regular Rate:Normal     Neuro/Psych  Headaches, Hx/o peripheral neuropathy negative psych ROS   GI/Hepatic Neg liver ROS, hiatal hernia, GERD  Medicated and Controlled,Hx/o Pancreatic insufficiency Crohn's disease   Endo/Other  negative endocrine ROS  Renal/GU negative Renal ROS  negative genitourinary   Musculoskeletal  (+) Arthritis , Osteoarthritis and Rheumatoid disorders,  Open wound right knee S/P right TKR   Abdominal   Peds  Hematology  (+) anemia ,   Anesthesia Other Findings   Reproductive/Obstetrics                             Anesthesia Physical Anesthesia Plan  ASA: II  Anesthesia Plan: General   Post-op Pain Management:    Induction: Intravenous  PONV Risk Score and Plan: 4 or greater and Ondansetron, Dexamethasone and Treatment may vary due to age or medical condition  Airway Management Planned: LMA  Additional Equipment:   Intra-op Plan:   Post-operative Plan: Extubation in OR  Informed Consent: I have reviewed the patients History and Physical, chart, labs and discussed the procedure including the risks, benefits and alternatives for the proposed anesthesia with the patient or authorized representative who has indicated his/her understanding and acceptance.     Dental advisory  given  Plan Discussed with: CRNA and Surgeon  Anesthesia Plan Comments:         Anesthesia Quick Evaluation

## 2018-10-01 NOTE — Transfer of Care (Signed)
Immediate Anesthesia Transfer of Care Note  Patient: [name]  Procedure(s) Performed: Excision of right knee wound with placement of ACell and Pravena (Right Knee)  Patient Location: PACU  Anesthesia Type:General  Level of Consciousness: awake and confused  Airway & Oxygen Therapy: Patient Spontanous Breathing and Patient connected to face mask oxygen  Post-op Assessment: Report given to RN and Post -op Vital signs reviewed and stable  Post vital signs: Reviewed and stable  Last Vitals:  Vitals Value Taken Time  BP    Temp    Pulse 107 10/01/18 1159  Resp 17 10/01/18 1159  SpO2 95 % 10/01/18 1159  Vitals shown include unvalidated device data.  Last Pain:  Vitals:   10/01/18 1033  TempSrc: Oral  PainSc: 0-No pain         Complications: No apparent anesthesia complications

## 2018-10-02 ENCOUNTER — Ambulatory Visit (INDEPENDENT_AMBULATORY_CARE_PROVIDER_SITE_OTHER): Payer: Medicare Other | Admitting: Surgical

## 2018-10-02 ENCOUNTER — Encounter: Payer: Self-pay | Admitting: Surgical

## 2018-10-02 VITALS — BP 165/88 | HR 62 | Temp 98.0°F

## 2018-10-02 DIAGNOSIS — S81001D Unspecified open wound, right knee, subsequent encounter: Secondary | ICD-10-CM

## 2018-10-02 NOTE — Progress Notes (Signed)
   Subjective:     Patient ID: [name], female    DOB: 14-Jan-1945, 73 y.o.   MRN: [id number]  Chief Complaint  Patient presents with  . Follow-up   HPI: The patient is a 73 y.o. female here for follow-up after excision of right knee wound with placement of ACell and Praveena VAC yesterday.  Patient was seen in the office last night after surgical intervention because her Praveena VAC was leaking.  Upon leaving the office yesterday the [facility] was sealed and working.  She presented to the office today because the [facility] was leaking once again.  We removed the VAC sponge and tried to fix the leak but machine was not working.  Her wound is doing well, no drainage noted.  No surrounding erythema.  No fever, chills, nausea, vomiting.  Review of Systems  Constitutional: Negative for chills, diaphoresis, fever and malaise/fatigue.       Positive activity change  Respiratory: Negative.   Cardiovascular: Negative.   Genitourinary: Negative.   Musculoskeletal: Negative.   Skin: Negative for itching and rash.       Positive wound  Neurological: Positive for sensory change.  Psychiatric/Behavioral: Negative.      Objective:   Vital Signs BP (!) 165/88 (BP Location: Left Arm, Patient Position: Sitting, Cuff Size: Normal)   Pulse 62   Temp 98 F (36.7 C) (Temporal)   SpO2 90%  Vital Signs and Nursing Note Reviewed Chaperone present Physical Exam  Constitutional: She is oriented to person, place, and time and well-developed, well-nourished, and in no distress.  HENT:  Head: Normocephalic and atraumatic.  Cardiovascular: Normal rate.  Pulmonary/Chest: Effort normal.  Musculoskeletal:     Right knee: She exhibits decreased range of motion. She exhibits no swelling, no effusion, no ecchymosis and no deformity.       Legs:  Neurological: She is alert and oriented to person, place, and time.  Skin: Skin is warm and dry. No rash noted. She is not diaphoretic. No erythema. No pallor.   Psychiatric: Mood and affect normal.      Assessment/Plan:     ICD-10-CM   1. Open wound of right knee, subsequent encounter  S81.001D     Wound VAC was not working and holding a seal.  We tried to reseal the VAC with additional tape and an additional sponge, but this was unsuccessful.  Nursing staff to order wound VAC for patient to receive in the mail at home.  Patient will bring in supplies on Monday or Tuesday depending on delivery of the wound VAC and we will change it in the office at that time.  Until then they can do daily K-Y jelly dressing changes to the Adaptic mesh, then wrap with Kerlix and Ace..  Do not shower for 1 week.  Eat a well-balanced healthy diet with high-protein low carbs.  Continue to drink plenty of water.  Call with any questions or concerns prior to visit on Monday or Tuesday.   Carola Rhine Birtha Hatler, PA-C 10/02/2018, 10:07 AM

## 2018-10-02 NOTE — Addendum Note (Signed)
Addendum  created 10/02/18 1256 by Willa Frater, CRNA   Charge Capture section accepted

## 2018-10-08 ENCOUNTER — Ambulatory Visit (INDEPENDENT_AMBULATORY_CARE_PROVIDER_SITE_OTHER): Payer: Medicare Other | Admitting: Internal Medicine

## 2018-10-08 ENCOUNTER — Telehealth: Payer: Self-pay

## 2018-10-08 ENCOUNTER — Encounter: Payer: Self-pay | Admitting: Surgical

## 2018-10-08 ENCOUNTER — Encounter: Payer: Self-pay | Admitting: Internal Medicine

## 2018-10-08 VITALS — BP 143/80 | HR 76 | Temp 97.6°F | Ht 60.0 in | Wt 127.2 lb

## 2018-10-08 DIAGNOSIS — K8689 Other specified diseases of pancreas: Secondary | ICD-10-CM

## 2018-10-08 DIAGNOSIS — K449 Diaphragmatic hernia without obstruction or gangrene: Secondary | ICD-10-CM

## 2018-10-08 DIAGNOSIS — K219 Gastro-esophageal reflux disease without esophagitis: Secondary | ICD-10-CM

## 2018-10-08 DIAGNOSIS — R110 Nausea: Secondary | ICD-10-CM

## 2018-10-08 DIAGNOSIS — R4702 Dysphasia: Secondary | ICD-10-CM

## 2018-10-08 DIAGNOSIS — K591 Functional diarrhea: Secondary | ICD-10-CM

## 2018-10-08 DIAGNOSIS — K50919 Crohn's disease, unspecified, with unspecified complications: Secondary | ICD-10-CM

## 2018-10-08 DIAGNOSIS — K5909 Other constipation: Secondary | ICD-10-CM

## 2018-10-08 MED ORDER — ONDANSETRON HCL 4 MG PO TABS: 4.0000 mg | ORAL_TABLET | Freq: Three times a day (TID) | ORAL | 1 refills | Status: DC | PRN

## 2018-10-08 MED ORDER — OMEPRAZOLE 40 MG PO CPDR: 40.0000 mg | DELAYED_RELEASE_CAPSULE | Freq: Two times a day (BID) | ORAL | 3 refills | Status: DC

## 2018-10-08 MED ORDER — MESALAMINE 1.2 G PO TBEC: 4.8000 g | DELAYED_RELEASE_TABLET | Freq: Every day | ORAL | 3 refills | Status: DC

## 2018-10-08 MED ORDER — PANCRELIPASE (LIP-PROT-AMYL) 12000-38000 UNITS PO CPEP: 24000.0000 [IU] | ORAL_CAPSULE | Freq: Three times a day (TID) | ORAL | 1 refills | Status: DC

## 2018-10-08 MED ORDER — DICYCLOMINE HCL 20 MG PO TABS: 20.0000 mg | ORAL_TABLET | Freq: Four times a day (QID) | ORAL | 1 refills | Status: DC | PRN

## 2018-10-08 MED ORDER — LOPERAMIDE HCL 2 MG PO CAPS: 2.0000 mg | ORAL_CAPSULE | Freq: Three times a day (TID) | ORAL | 1 refills | Status: DC | PRN

## 2018-10-08 NOTE — Patient Instructions (Signed)
We have sent medications to your pharmacy for you to pick up at your convenience.  Please follow up as needed

## 2018-10-08 NOTE — Telephone Encounter (Signed)

## 2018-10-08 NOTE — Progress Notes (Signed)
Subjective:     Patient ID: [name], female    DOB: May 21, 1945, 73 y.o.   MRN: [id number]  Chief Complaint  Patient presents with  . Post-op Follow-up    for excision of (R) knee wound w/placement of Acell    HPI: The patient is a 73 y.o. female here for follow-up after excision of right knee wound with placement of ACell and Praveena VAC.  Postoperatively her Praveena VAC had some leaking and was subsequently removed due to to [facility] machine is not working properly despite adequate seal  She was last seen 6 days ago and was recommended to do daily K-Y jelly dressing changes with Adaptic, Kerlix, Ace.  On exam today the wound is quite dry and Adaptic mesh is hard.  They reported they have been doing daily KY dressing changes daily. The wound today is 3.2 x 2 cm x 0.5 cm. Acell sheet and powder incorporating. Surrounding skin is dry and flaky. Wound bed is clean, non-erythematous, no drainage noted.   Her family member is frustrated and upset that she has this wound from her orthopedic surgery. He asked for a timeline on her healing process, and he was informed that we cannot give a definitive answer as everyone heals differently, but that I would expect MINIMUM healing time to be 3-4 weeks. It can heal quicker if she maintains a healthy, high protein, low carb/sugar diet and adds a multivitamin to her daily regimen. She should avoid flexing at the knee or doing any excessive yard work.   Denies fever, chills, n/v. No purulent drainage.    Review of Systems  Constitutional: Negative for chills, diaphoresis, fever, malaise/fatigue and weight loss.       + activity change   Respiratory: Negative.   Cardiovascular: Negative.   Gastrointestinal: Negative.   Genitourinary: Negative.   Musculoskeletal: Positive for joint pain.  Skin: Negative for itching and rash.  Neurological: Negative.      Objective:   Vital Signs BP (!) 177/90 (BP Location: Left Arm, Patient Position: Sitting,  Cuff Size: Normal)   Pulse 95   Temp 97.7 F (36.5 C) (Temporal)   Ht 5' (1.524 m)   SpO2 95%   BMI 24.84 kg/m  Vital Signs and Nursing Note Reviewed Chaperone present Physical Exam  Constitutional: She is oriented to person, place, and time and well-developed, well-nourished, and in no distress. No distress.  HENT:  Head: Normocephalic and atraumatic.  Cardiovascular: Normal rate.  Pulmonary/Chest: Effort normal.  Musculoskeletal:        General: Tenderness and deformity present. No edema.       Legs:  Neurological: She is alert and oriented to person, place, and time.  Skin: Skin is warm and dry. No rash noted. She is not diaphoretic. No erythema. No pallor.  Psychiatric: Mood and affect normal.        Assessment/Plan:     ICD-10-CM   1. Open wound of right knee, subsequent encounter  S81.001D   2. S/P right TKA  Z96.651     Wound vac placed over new adaptic dressing. Old adaptic dressing removed due to significant dryness. Adaptic, KY jelly, wound vac sponge, wound vac clear tape placed. Assistance provided by [facility] rep.   Clinical staff working on home health assistance with wound vac changes twice weekly.   Had a thorough conversation with [name] and her family member in regards to her wound healing process.  I made it clear to them that we cannot give  them a definite timeline on her wound healing process and that she should continue to eat healthy, take a multivitamin, drink plenty of fluids to promote healing quicker. Everyone heals at a different rate and I cannot guarantee that her wound will be healed in a few weeks and this is very unlikely.  They want the wound to be healed prior to October 12 because they are planning to travel to [facility] for a family members birthday.  I advised them that we can talk more about this at her next visit next week but that this wound will not likely be healed by then.  Follow up next week.  If they cannot get home  health assistance they can return to the clinic next week for wound VAC change.  The wound VAC upon departure had a good seal and there were no issues.   Carola Rhine Danetta Prom, PA-C 10/09/2018, 11:19 AM

## 2018-10-09 ENCOUNTER — Encounter: Payer: Self-pay | Admitting: Internal Medicine

## 2018-10-09 ENCOUNTER — Ambulatory Visit (INDEPENDENT_AMBULATORY_CARE_PROVIDER_SITE_OTHER): Payer: Medicare Other | Admitting: Surgical

## 2018-10-09 ENCOUNTER — Other Ambulatory Visit: Payer: Self-pay

## 2018-10-09 ENCOUNTER — Encounter: Payer: Self-pay | Admitting: Surgical

## 2018-10-09 VITALS — BP 177/90 | HR 95 | Temp 97.7°F | Ht 60.0 in

## 2018-10-09 DIAGNOSIS — S81001D Unspecified open wound, right knee, subsequent encounter: Secondary | ICD-10-CM

## 2018-10-09 DIAGNOSIS — Z96651 Presence of right artificial knee joint: Secondary | ICD-10-CM

## 2018-10-09 NOTE — Progress Notes (Signed)
HISTORY OF PRESENT ILLNESS:  [name] is a 73 y.o. female who is referred by her primary care provider Dr. Jerilee [name] to establish GI care.  She is new to [place].  Her GI history is quite extensive.  It dates back many years.  I have reviewed 31 pages of her most recent GI care which was provided by Dr. Beulah [name] in [place].  This along with history provided by the patient is as follows:  1.  Crohn's colitis diagnosed at the [facility] in 1997.  Last colonoscopy with Dr. Beulah [name] Jun 05, 2017.  The ileum was normal.  Chronic colitis noted in the left colon.  Diminutive pseudopolyp in the cecum.  Mild internal hemorrhoids.  Biopsies of the cecal polyp were a benign lymphoid aggregate.  Biopsies in the right colon were normal.  Biopsies in the left colon revealed inactive chronic colitis.  The patient was told to take Lialda 4.8 g daily for 8 weeks then 2.4 g daily thereafter.  She tells me that she has been off Lialda for 6 months.  She tells me that she self medicates with amoxicillin periodically for "Crohn's flare". 2.  GERD.  On omeprazole 40 mg daily. 3.  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  All studies in [place]..  Surgery recommended.  Patient has declined.  Last EGD with Dr. Beulah [name] March 04, 2017.  Noted to have a large hiatal hernia, mild gastritis, and benign gastric polyp. 4.  Chronic diarrhea with intermittent constipation.  Patient has had extensive work-up abnormalities include low pancreatic fecal elastase with normal stool fat.  She has been given the diagnosis of pancreatic insufficiency and treated with Creon.  Mild elevation of gastrin (364) with negative octreotide scan.  Normal 5-hydroxyindoleacetic acid levels.  Normal lactose tolerance test.  Negative C. difficile.  Colon biopsies negative for microscopic colitis.  Negative Pentetriotide scan.  She  takes approximately 2 Imodium per day. 5.  Chronic nausea.  Takes Zofran 6.  Intermittent chronic abdominal pain.  Takes dicyclomine.  Patient tells me that her reflux is for the most part managed with omeprazole.  She does have occasional breakthrough with dietary indiscretion.  She does report occasional intermittent dysphasia and understands that her paraesophageal hernia is compressing the esophagus.  Her issues with nausea and occasional vomiting continue without change.  In addition to her diarrhea she will have 2 to 3 days without bowel movements.  She has intermittent left shoulder pain which she attributes Crohn's.  She does request refill of her multiple GI medications.  As best I can tell she is having no acute symptoms in any regard.  In particular, no bleeding, fevers, severe abdominal pain, weight change.  REVIEW OF SYSTEMS:  All non-GI ROS negative unless otherwise stated in the HPI except for sinus and allergy, arthritis, back pain, visual change, cough, hearing problems, shortness of breath, urinary leakage  Past Medical History:  Diagnosis Date  . Allergies   . Bruises easily   . Complication of anesthesia   . Crohn's disease ([place])    GI Dr [name] in [place], [place]   . Deviated septum   . GERD (gastroesophageal reflux disease)   . Headache    occ   . Hiatal hernia   . HTN (hypertension)    pcp Dr [name]  in [name], [place]   . Neuropathy    fingers,periodically   .  Osteoarthritis   . Pancreatic insufficiency   . PONV (postoperative nausea and vomiting)   . Rheumatoid arthritis ([place])   . Scoliosis   . Squamous cell carcinoma of arm    left   . Whooping cough with pneumonia    2017    Past Surgical History:  Procedure Laterality Date  . CATARACT EXTRACTION, BILATERAL  2018   with IOC implant   . COLONOSCOPY    . DEBRIDEMENT AND CLOSURE WOUND Right 10/01/2018   Procedure: Excision of right knee wound with placement of ACell and Pravena;   Surgeon: April Going, DO;  Location: [facility];  Service: Plastics;  Laterality: Right;  30 min  . JOINT REPLACEMENT    . knee fracture     . NASAL FRACTURE SURGERY     needed b/c she was getting frequent sinus infections   . TOTAL KNEE ARTHROPLASTY Right 07/28/2018   Procedure: TOTAL KNEE ARTHROPLASTY;  Surgeon: April Cancel, MD;  Location: [facility];  Service: Orthopedics;  Laterality: Right;  . UPPER GASTROINTESTINAL ENDOSCOPY      Social History [name]  reports that she quit smoking about 30 years ago. Her smoking use included cigarettes. She quit after 30.00 years of use. She has never used smokeless tobacco. She reports previous alcohol use. She reports that she does not use drugs.  family history includes Diabetes in her father.  Allergies  Allergen Reactions  . Adhesive [Tape]   . Aspirin     Intestinal Bleeding  . Ciprofloxacin Nausea And Vomiting  . Codeine Nausea And Vomiting  . Demerol [Meperidine Hcl] Nausea And Vomiting  . Lactose Intolerance (Gi) Other (See Comments)    Pt has Crohn's   . Latex     Redness and skin peeling  . Other     Nuts-itching in throat  Seeds-stomach issues with chrons    . Septra [Sulfamethoxazole-Trimethoprim] Nausea Only       PHYSICAL EXAMINATION: Vital signs: BP (!) 143/80   Pulse 76   Temp 97.6 F (36.4 C)   Ht 5' (1.524 m)   Wt 127 lb 3.2 oz (57.7 kg)   SpO2 98%   BMI 24.84 kg/m   Constitutional: generally well-appearing, no acute distress Psychiatric: alert and oriented x3, cooperative Eyes: extraocular movements intact, anicteric, conjunctiva pink Mouth: oral pharynx moist, no lesions Neck: supple no lymphadenopathy Cardiovascular: heart regular rate and rhythm, no murmur Lungs: clear to auscultation bilaterally Abdomen: soft, nontender, nondistended, no obvious ascites, no peritoneal signs, normal bowel sounds, no organomegaly Rectal: Omitted Extremities: no clubbing, cyanosis, or lower  extremity edema bilaterally.  Right lower extremity has a bandage wrap Skin: no lesions on visible extremities aside from multiple actinic keratoses Neuro: No focal deficits.  Cranial nerves intact  ASSESSMENT:  1.  Crohn's colitis (left-sided).  Minimal findings on most recent colonoscopy.  Does not appear to have active symptoms.  Took herself off of mesalamine 6 months ago. 2.  GERD.  For the most part controlled with PPI.  Occasional breakthrough 3.  Dysphasia.  Likely secondary to large paraesophageal hernia 4.  Large paraesophageal hernia.  I advised a surgical opinion regarding repair to avoid significant complications down the road. 5.  Chronic diarrhea with intermittent constipation.  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 6.  Low fecal pancreatic elastase.  Query pancreatic insufficiency.  On Creon with questionable benefit per patient.  Normal fecal fat 7.  Chronic nausea 8.  Chronic abdominal pain    PLAN:  1.  Advised to resume Lialda 2.4 g daily.  Prescribed. 2.  Continue omeprazole 40 mg daily.  Prescribed 3.  Reflux precautions 4.  Creon prescribed at patient's request 5.  Patient requests Zofran for nausea.  Prescribe 6.  Okay to use Imodium as needed.  Prescribed 7.  Dicyclomine as needed for abdominal pain.  Prescribed 8.  Declined to prescribe amoxicillin for the patient's on-demand use for "Crohn's flare". 9.  Advised surgical opinion for large paraesophageal hernia.  The patient wishes to hold off. 10.  Routine GI follow-up 1 year Greater than 60-minute spent face-to-face with the patient.  Greater than 50% of the time used for counseling regarding her multiple GI diagnosis and sorting through her past history as well as discussing multiple current GI therapies and the medication risks and benefits.

## 2018-10-12 ENCOUNTER — Telehealth: Payer: Self-pay

## 2018-10-12 NOTE — Telephone Encounter (Signed)

## 2018-10-13 ENCOUNTER — Ambulatory Visit: Payer: Medicare Other | Admitting: Surgical

## 2018-10-14 ENCOUNTER — Telehealth: Payer: Self-pay | Admitting: Internal Medicine

## 2018-10-15 MED ORDER — CREON 24000-76000 UNITS PO CPEP: ORAL_CAPSULE | ORAL | 3 refills | Status: DC

## 2018-10-15 NOTE — Telephone Encounter (Signed)
Spoke with patient and clarified dosage:  48,000 (2 capsules) with each meal (3 times a day.  Resent rx

## 2018-10-19 ENCOUNTER — Telehealth: Payer: Self-pay | Admitting: Plastic Surgery

## 2018-10-19 NOTE — Telephone Encounter (Signed)

## 2018-10-20 ENCOUNTER — Other Ambulatory Visit: Payer: Self-pay

## 2018-10-20 ENCOUNTER — Ambulatory Visit (INDEPENDENT_AMBULATORY_CARE_PROVIDER_SITE_OTHER): Payer: Medicare Other | Admitting: Surgical

## 2018-10-20 ENCOUNTER — Encounter: Payer: Self-pay | Admitting: Surgical

## 2018-10-20 VITALS — BP 179/94 | HR 104 | Temp 98.0°F | Ht 60.0 in | Wt 124.0 lb

## 2018-10-20 DIAGNOSIS — S81001D Unspecified open wound, right knee, subsequent encounter: Secondary | ICD-10-CM

## 2018-10-20 NOTE — Progress Notes (Signed)
Subjective:     Patient ID: [name], female    DOB: 1945/05/21, 73 y.o.   MRN: [id number]  Chief Complaint  Patient presents with  . Follow-up    on (R) knee wound    HPI: The patient is a 73 y.o. female here for follow-up after excision of right knee wound with placement of Acell and Prevena VAC.  Her husband is with her today.  She has been receiving assistance from home health with wound vac changes weekly.   Wound at last visit, 11 days ago was 3.2 x 2 x 0.5 cm in size.  Today on exam she has a wound VAC in place.  She denies any fever, chills, nausea, vomiting.  She has good granulation tissue today with new epithelial tissue surrounding the wound.  It is slightly hyper granulated.  She does not notice any improvement in her wound, but her husband states that he notices the improvement.  They have been eating well.  Review of Systems  Constitutional: Negative for chills, diaphoresis, fever, malaise/fatigue and weight loss.       + activity change  Respiratory: Negative for cough, shortness of breath and wheezing.   Cardiovascular: Negative for chest pain, palpitations, orthopnea and leg swelling.  Gastrointestinal: Negative for abdominal pain, diarrhea, nausea and vomiting.  Genitourinary: Negative.   Musculoskeletal: Negative for back pain, myalgias and neck pain.  Skin: Negative for itching and rash.       + wound  Neurological: Negative for dizziness, focal weakness and weakness.     Objective:   Vital Signs BP (!) 179/94 (BP Location: Left Arm, Patient Position: Sitting, Cuff Size: Normal)   Pulse (!) 104   Temp 98 F (36.7 C) (Temporal)   Ht 5' (1.524 m)   Wt 124 lb (56.2 kg)   SpO2 (!) 89%   BMI 24.22 kg/m  Vital Signs and Nursing Note Reviewed  Physical Exam  Constitutional: She is oriented to person, place, and time and well-developed, well-nourished, and in no distress.  HENT:  Head: Normocephalic and atraumatic.  Cardiovascular: Normal rate.   Pulmonary/Chest: Effort normal.  Musculoskeletal: Normal range of motion.  Neurological: She is alert and oriented to person, place, and time.  Skin: Skin is warm and dry. No rash noted. She is not diaphoretic. There is erythema. No pallor.     Psychiatric: Mood and affect normal.      Assessment/Plan:     ICD-10-CM   1. Open wound of right knee, subsequent encounter  S81.001D     [name] is doing well overall, she is not pleased with her progress and believes she has made no progress at all. She also stated that she is worried about the bleeding that occurs with dressing changes. I informed her this is normal and a good sign that she is receiving adequate blood supply for wound healing.  She is doing well, her wound has granulated in to fill the gap and she is even developing some new epithelial tissue. Wound vac removed in office today, allow it to stay off for ~1 week and restart changed 10/27/18 2x weekly. If she hypergranulates past the epithelium, may need to try silver nitrate or collagen with compression dressings   She has been eating a healthy diet. She is scheduled for an additional wound vac change.  Pictures were obtained of the patient and placed in the chart with the patient's or guardian's permission.  Follow up in 2 weeks.  Carola Rhine  [name], PA-C 10/20/2018, 5:52 PM

## 2018-10-23 ENCOUNTER — Telehealth: Payer: Self-pay | Admitting: Internal Medicine

## 2018-10-26 NOTE — Telephone Encounter (Signed)
Spoke with pharmacist who was able to identify the correct Creon rx (24,000) but stated patient had called and had it transferred [facility] a [facility] [place] where [facility] was on [facility].  I left message for patient with this information and told her to transfer it back to the [facility] if she needed to.

## 2018-10-30 ENCOUNTER — Telehealth: Payer: Self-pay | Admitting: Plastic Surgery

## 2018-10-30 NOTE — Telephone Encounter (Signed)
[name] from [facility] called to let us know that [name] cancelled her visit today because she said that the wound vac is not being applied anymore.

## 2018-11-02 ENCOUNTER — Telehealth: Payer: Self-pay

## 2018-11-02 NOTE — Telephone Encounter (Signed)
Tried to call and speak with the patient on (10/30/18) regarding the message below.  Per Matthew,PA-he would like for the patient to use (Collagen everyother day,KY jelly,Kerlix,and ace wrap).  Unable to leave message for the patient.  Tried reaching the patient again today, still not able to leave a message.//AB/CMA

## 2018-11-02 NOTE — Telephone Encounter (Signed)

## 2018-11-03 ENCOUNTER — Ambulatory Visit (INDEPENDENT_AMBULATORY_CARE_PROVIDER_SITE_OTHER): Payer: Medicare Other | Admitting: Surgical

## 2018-11-03 ENCOUNTER — Encounter: Payer: Self-pay | Admitting: Surgical

## 2018-11-03 ENCOUNTER — Other Ambulatory Visit: Payer: Self-pay

## 2018-11-03 VITALS — BP 152/85 | HR 104 | Temp 97.8°F | Ht 60.0 in | Wt 127.4 lb

## 2018-11-03 DIAGNOSIS — S81001D Unspecified open wound, right knee, subsequent encounter: Secondary | ICD-10-CM

## 2018-11-03 NOTE — Progress Notes (Signed)
   Subjective:     Patient ID: [name], female    DOB: Feb 03, 1945, 73 y.o.   MRN: [id number]  Chief Complaint  Patient presents with  . Follow-up    2 weeks   HPI: The patient is a 73 y.o. female here for follow-up on her right knee wound. Her husband is with her today.  Received call from home health that patient denied placement of wound vac at their last visit ~ 1 week ago.  Today she is doing a lot better. Wound has significantly decreased in size and is healing well. She has incorporated Acell and has good granulation tissue. The wound is 2 x 1.5 x + 0.2cm. Slightly hypergranulated.  They have been doing daily KY jelly, Kerlix, ACE.  No fever, chills, n/v. She reports she is having pain, but it feels deep in her knee. She has multiple LE leg lesions and significant dry skin.   Review of Systems  Constitutional: Positive for activity change. Negative for appetite change, chills, diaphoresis, fatigue and fever.  Respiratory: Negative for shortness of breath and wheezing.   Cardiovascular: Negative for chest pain and leg swelling.  Gastrointestinal: Negative for diarrhea, nausea and vomiting.  Skin: Positive for wound. Negative for color change, pallor and rash.       + dry skin   Neurological: Negative for dizziness, weakness, light-headedness and headaches.    Objective:   Vital Signs BP (!) 152/85 (BP Location: Left Arm, Patient Position: Sitting, Cuff Size: Normal)   Pulse (!) 104   Temp 97.8 F (36.6 C) (Temporal)   Ht 5' (1.524 m)   Wt 127 lb 6.4 oz (57.8 kg)   SpO2 94%   BMI 24.88 kg/m  Vital Signs and Nursing Note Reviewed Chaperone present Physical Exam  Constitutional: She is oriented to person, place, and time and well-developed, well-nourished, and in no distress.  HENT:  Head: Normocephalic and atraumatic.  Cardiovascular: Normal rate.  Pulmonary/Chest: Effort normal.  Musculoskeletal:        General: No tenderness, deformity or edema.   Right knee: She exhibits no swelling and no erythema.       Legs:     Comments: Decreased ROM R side.  Neurological: She is alert and oriented to person, place, and time. Gait normal.  Skin: Skin is warm and dry. No rash noted. She is not diaphoretic. No erythema. No pallor.  Psychiatric: Mood and affect normal.    Assessment/Plan:     ICD-10-CM   1. Open wound of right knee, subsequent encounter  S81.001D     [name] is doing well. Wound is healing well with good granulation tissue noted on exam. No swelling, erythema, sign of infection noted.  Begin with collagen dressing changes q48 hours pending soil level. Light amount of KY jelly, kerlix, ACE.   Pictures were obtained of the patient and placed in the chart with the patient's or guardian's permission.   Carola Rhine Lyncoln Maskell, PA-C 11/03/2018, 4:44 PM

## 2018-11-05 ENCOUNTER — Telehealth: Payer: Self-pay | Admitting: *Deleted

## 2018-11-05 NOTE — Telephone Encounter (Signed)
Faxed order to [facility] for supplies to be mailed to the patient's home.  Confirmation received.//AB/CMA

## 2018-11-06 ENCOUNTER — Telehealth: Payer: Self-pay

## 2018-11-06 NOTE — Telephone Encounter (Signed)
Received call from [name] stating that Medicare denied her [facility] medical supplies as she needed discharge instructions from her home health first. She would like to know how to obtain these and have them sent over to [facility]. Patient does not have way to contact home health and will need assistance obtaining discharge paperwork.

## 2018-11-09 NOTE — Telephone Encounter (Signed)
Patient was seen in the office on (11/03/18) by Matthew,PA//AB/CMA

## 2018-11-09 NOTE — Telephone Encounter (Signed)
Called and [facility] @ 2:03pm) asking the patient to RTC regarding the message below.//AB/CMA

## 2018-11-11 ENCOUNTER — Telehealth: Payer: Self-pay | Admitting: *Deleted

## 2018-11-11 NOTE — Telephone Encounter (Signed)
Received fax on (11/03/18) from [facility] regarding missed visit with the patient.  Dr. Marla Roe informed.//AB/CMA

## 2018-11-11 NOTE — Telephone Encounter (Signed)
See telephone note on (11/11/18).//AB/CMA

## 2018-11-11 NOTE — Telephone Encounter (Addendum)
Called [facility] and spoke with (Alyae) and she stated that she spoke with the patient, and she also called and spoke with [facility] to try to get discharge information so that they can go ahead and sent supplies to the patient but have not received anything.    Called [facility] and spoke with [facility] regarding the fax, and she stated that she had spoken with the patient and with [facility], but she can not just sent [facility] the patient's medical information.  Nicki asked if I had a number for [facility] and she will give them a call.  Nicki was given [facility]'s number and Alyae's name.//AB/CMA

## 2018-11-11 NOTE — Telephone Encounter (Signed)
Received fax Discharge-Transfer Summary Report from [facility].  Called [facility] and spoke with Tabitha and she stated that they also received the Discharge Report, and the patient should be receiving the medical supplies in 1-2 days.   Called and LMOM informing the patient that [facility] has received the Discharge Summary from [facility] and she should be receiving the medical supplies in 1-2 days.  Asked the patient to give me a call back if she has any questions.//AB/CMA

## 2018-11-11 NOTE — Telephone Encounter (Signed)
Received fax from [facility] requesting wound measurements between dates:10/25/18-11/09/18.  Faxed request back stating that wound vac removed (10/20/18)-per [name], [place].  Request faxed to [facility] and confirmation received.//AB/CMA

## 2018-11-11 NOTE — Telephone Encounter (Addendum)
Called [facility] on (11/05/18) and spoke with Benjamine Mola and canceled home health for the patient-per Matthew,PA.//AB/CMA

## 2018-11-11 NOTE — Telephone Encounter (Signed)
Called and spoke with the patient on (11/10/18) regarding fax from [place] saying that the patient is currently active with [facility], and order has been forwarded to [facility] agency to service the patient.  Patient stated that she was aware of this and she call [facility] and [facility] and spoke with someone in regards to this.  She was not sure if anything was done about it.  Informed the patient that I would give HH and [facility] a call.//AB/CMA

## 2018-11-17 ENCOUNTER — Ambulatory Visit (INDEPENDENT_AMBULATORY_CARE_PROVIDER_SITE_OTHER): Payer: Medicare Other | Admitting: Surgical

## 2018-11-17 ENCOUNTER — Other Ambulatory Visit: Payer: Self-pay

## 2018-11-17 ENCOUNTER — Encounter: Payer: Self-pay | Admitting: Surgical

## 2018-11-17 VITALS — BP 164/82 | HR 96 | Temp 97.5°F | Ht 60.0 in | Wt 130.6 lb

## 2018-11-17 DIAGNOSIS — S81001D Unspecified open wound, right knee, subsequent encounter: Secondary | ICD-10-CM

## 2018-11-17 DIAGNOSIS — Z96651 Presence of right artificial knee joint: Secondary | ICD-10-CM

## 2018-11-17 NOTE — Progress Notes (Signed)
   Subjective:     Patient ID: [name], female    DOB: 1945-09-25, 73 y.o.   MRN: [id number]  Chief Complaint  Patient presents with  . Follow-up    2 weeks    HPI: The patient is a 73 y.o. female here for follow-up on her right knee wound. She is very happy with her progress. She had some difficulty obtaining collagen, but is now using it every other day.  Wound today is 1 x 1 x +0.15 cm in size. Hypergranulated slightly.  No fever, chills, n/v. She has not been doing PT due to fear or wound opening or recurrence. She is ambulating normally. She has some right lateral knee superficial sensory changes, but otherwise is doing fine. No knee pain.  Review of Systems  Constitutional: Positive for activity change. Negative for appetite change, chills, diaphoresis, fatigue and fever.  Respiratory: Negative for cough and shortness of breath.   Cardiovascular: Negative for chest pain and leg swelling.  Gastrointestinal: Negative.   Skin: Positive for wound.       + multiple skin lesions   Neurological: Positive for numbness. Negative for dizziness, weakness and headaches.     Objective:   Vital Signs BP (!) 164/82 (BP Location: Left Arm, Patient Position: Sitting, Cuff Size: Normal)   Pulse 96   Temp (!) 97.5 F (36.4 C) (Temporal)   Ht 5' (1.524 m)   Wt 130 lb 9.6 oz (59.2 kg)   SpO2 91%   BMI 25.51 kg/m  Vital Signs and Nursing Note Reviewed  Physical Exam  Constitutional: She is oriented to person, place, and time and well-developed, well-nourished, and in no distress.  HENT:  Head: Normocephalic and atraumatic.  Cardiovascular: Normal rate.  Pulmonary/Chest: Effort normal.  Musculoskeletal:        General: No deformity or edema.  Neurological: She is alert and oriented to person, place, and time. Gait normal.  Skin: Skin is warm and dry. No rash noted. She is not diaphoretic. No erythema. No pallor.  Dry skin over bilateral LE and hands. Multiple skin lesions  over extremities.  R knee wound 1 x 1 x +0.15 cm, hypergranulated.   Psychiatric: Mood and affect normal.      Assessment/Plan:     ICD-10-CM   1. Open wound of right knee, subsequent encounter  S81.001D   2. 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 She is very pleased with her progress. She is still hypergranulated, we had difficulty obtaining collagen for her.   Hypergranulated tissue was chemically cauterized with silver nitrate today. Collagen and KY jelly applied. Change q48 hours. Showering is fine. Multivitamin and vitamin C. High protein diet. Low sugar. Drink plenty of fluids.  Follow up with ortho for further PT recommendations. Recommended following up to avoid decreased ROM.  Pictures were obtained of the patient and placed in the chart with the patient's or guardian's permission.  F/u in 2 weeks.   Carola Rhine Tachina Spoonemore, PA-C 11/17/2018, 2:07 PM

## 2018-11-25 ENCOUNTER — Telehealth: Payer: Self-pay | Admitting: *Deleted

## 2018-11-25 NOTE — Telephone Encounter (Addendum)
Received Order Console Reports-Discharge order via of fax from [facility].  Requesting signature,date, and return.  Forms given to provider to sign.//AB/CMA

## 2018-11-30 ENCOUNTER — Telehealth: Payer: Self-pay

## 2018-11-30 NOTE — Telephone Encounter (Signed)

## 2018-12-01 ENCOUNTER — Ambulatory Visit (INDEPENDENT_AMBULATORY_CARE_PROVIDER_SITE_OTHER): Payer: Medicare Other | Admitting: Surgical

## 2018-12-01 ENCOUNTER — Other Ambulatory Visit: Payer: Self-pay

## 2018-12-01 ENCOUNTER — Encounter: Payer: Self-pay | Admitting: Surgical

## 2018-12-01 VITALS — BP 159/98 | HR 96 | Temp 97.3°F | Wt 132.4 lb

## 2018-12-01 DIAGNOSIS — S81001D Unspecified open wound, right knee, subsequent encounter: Secondary | ICD-10-CM

## 2018-12-01 DIAGNOSIS — Z96651 Presence of right artificial knee joint: Secondary | ICD-10-CM | POA: Diagnosis not present

## 2018-12-01 NOTE — Progress Notes (Addendum)
   Subjective:     Patient ID: [name], female    DOB: 1945/03/18, 73 y.o.   MRN: [id number]  Chief Complaint  Patient presents with  . Follow-up    HPI: The patient is a 73 y.o. female here for follow-up on her right knee wound.  On exam today her wound is 0.4 x 0.3 cm and very slightly hyper granulated at approximately 0.1 cm.  They have been applying collagen and small amount of K-Y jelly every other day.  They have also been wrapping the area with Kerlix and Ace bandages for compression.  She has not had any fevers, chills, nausea, vomiting.  She has not started PT yet.  They had some questions about when she should start PT.  Her blood pressure is elevated today at 176/106 and it was retaken 1549/98, she is asymptomatic.  She reports that she took her losartan this a.m.   Review of Systems  Constitutional: Positive for activity change. Negative for appetite change, chills, diaphoresis, fatigue and fever.  Respiratory: Negative for cough, chest tightness and shortness of breath.   Cardiovascular: Negative for chest pain, palpitations and leg swelling.  Musculoskeletal: Negative.   Skin: Positive for color change and wound. Negative for pallor and rash.    Objective:   Vital Signs BP (!) 159/98 (BP Location: Left Arm, Patient Position: Sitting, Cuff Size: Normal)   Pulse 96   Temp (!) 97.3 F (36.3 C) (Temporal)   Wt 132 lb 6.4 oz (60.1 kg)   BMI 25.86 kg/m  Vital Signs and Nursing Note Reviewed  Physical Exam  Constitutional: She is oriented to person, place, and time and well-developed, well-nourished, and in no distress.  HENT:  Head: Normocephalic and atraumatic.  Cardiovascular: Normal rate.  Pulmonary/Chest: Effort normal.  Musculoskeletal: Normal range of motion.  Neurological: She is alert and oriented to person, place, and time. Gait normal.  Skin: Skin is warm and dry. No rash noted. She is not diaphoretic. There is erythema (RLE). No pallor.      Right knee wound is 0.4 x 0.3 x +0.1 cm.  No surrounding erythema.  Right lower extremity with multiple skin lesions, dry skin.   Psychiatric: Mood and affect normal.    Assessment/Plan:     ICD-10-CM   1. S/P right TKA  Z96.651   2. Open wound of right knee, subsequent encounter  S81.001D     Hypergranulation tissue chemically cauterized with silver nitrate.  K-Y jelly, collagen, Kerlix, Ace wrap applied with slight compression.  Continue with every other day collagen dressing changes.  Recommend patient follow-up with PCP for reevaluation of high blood pressure.  She is asymptomatic at this time and understands that she should follow-up.  Recommend patient speak with orthopedic surgeon in regards to continuing PT, at this time she is cleared for PT from plastic surgery stance, wound is healing very well and there is no increased risk of opening with range of motion.  Pictures were obtained of the patient and placed in the chart with the patient's or guardian's permission.   Carola Rhine Consetta Cosner, PA-C 12/01/2018, 10:13 AM

## 2018-12-02 NOTE — Addendum Note (Signed)
Addended byRoetta Sessions on: 12/02/2018 08:41 AM   Modules accepted: Level of Service

## 2018-12-09 NOTE — Telephone Encounter (Signed)
Faxed signed Order Console Reports-Discharge order on (11/27/18) to [facility].//AB/CMA

## 2018-12-16 ENCOUNTER — Telehealth: Payer: Self-pay

## 2018-12-16 NOTE — Telephone Encounter (Signed)

## 2018-12-17 ENCOUNTER — Ambulatory Visit (INDEPENDENT_AMBULATORY_CARE_PROVIDER_SITE_OTHER): Payer: Medicare Other

## 2018-12-17 ENCOUNTER — Other Ambulatory Visit: Payer: Self-pay

## 2018-12-17 ENCOUNTER — Ambulatory Visit (INDEPENDENT_AMBULATORY_CARE_PROVIDER_SITE_OTHER): Payer: Medicare Other | Admitting: Internal Medicine

## 2018-12-17 ENCOUNTER — Encounter: Payer: Self-pay | Admitting: Internal Medicine

## 2018-12-17 ENCOUNTER — Encounter: Payer: Self-pay | Admitting: Surgical

## 2018-12-17 ENCOUNTER — Ambulatory Visit (INDEPENDENT_AMBULATORY_CARE_PROVIDER_SITE_OTHER): Payer: Medicare Other | Admitting: Surgical

## 2018-12-17 VITALS — BP 130/70 | HR 105 | Temp 98.0°F | Wt 133.9 lb

## 2018-12-17 VITALS — BP 187/84 | HR 93 | Temp 97.7°F | Ht 60.0 in | Wt 133.6 lb

## 2018-12-17 DIAGNOSIS — Z96651 Presence of right artificial knee joint: Secondary | ICD-10-CM

## 2018-12-17 DIAGNOSIS — S81001D Unspecified open wound, right knee, subsequent encounter: Secondary | ICD-10-CM

## 2018-12-17 DIAGNOSIS — H66002 Acute suppurative otitis media without spontaneous rupture of ear drum, left ear: Secondary | ICD-10-CM

## 2018-12-17 DIAGNOSIS — K5010 Crohn's disease of large intestine without complications: Secondary | ICD-10-CM | POA: Diagnosis not present

## 2018-12-17 DIAGNOSIS — R0989 Other specified symptoms and signs involving the circulatory and respiratory systems: Secondary | ICD-10-CM

## 2018-12-17 MED ORDER — AMOXICILLIN-POT CLAVULANATE 875-125 MG PO TABS: 1.0000 | ORAL_TABLET | Freq: Two times a day (BID) | ORAL | 0 refills | Status: AC

## 2018-12-17 NOTE — Addendum Note (Signed)
Addended by: Suzette Battiest on: 12/17/2018 04:18 PM   Modules accepted: Orders

## 2018-12-17 NOTE — Patient Instructions (Signed)
-  Nice seeing you today!!  -Start augmentin twice daily for 7 days.  -Chest xray today; will call you with results.   Otitis Media, Adult  Otitis media means that the middle ear is red and swollen (inflamed) and full of fluid. The condition usually goes away on its own. Follow these instructions at home:  Take over-the-counter and prescription medicines only as told by your doctor.  If you were prescribed an antibiotic medicine, take it as told by your doctor. Do not stop taking the antibiotic even if you start to feel better.  Keep all follow-up visits as told by your doctor. This is important. Contact a doctor if:  You have bleeding from your nose.  There is a lump on your neck.  You are not getting better in 5 days.  You feel worse instead of better. Get help right away if:  You have pain that is not helped with medicine.  You have swelling, redness, or pain around your ear.  You get a stiff neck.  You cannot move part of your face (paralyzed).  You notice that the bone behind your ear hurts when you touch it.  You get a very bad headache. Summary  Otitis media means that the middle ear is red, swollen, and full of fluid.  This condition usually goes away on its own. In some cases, treatment may be needed.  If you were prescribed an antibiotic medicine, take it as told by your doctor. This information is not intended to replace advice given to you by your health care provider. Make sure you discuss any questions you have with your health care provider. Document Released: 06/12/2007 Document Revised: 12/06/2016 Document Reviewed: 01/15/2016 Elsevier Patient Education  2020 [employer].

## 2018-12-17 NOTE — Progress Notes (Signed)
Acute office Visit     This visit occurred during the SARS-CoV-2 public health emergency.  Safety protocols were in place, including screening questions prior to the visit, additional usage of staff PPE, and extensive cleaning of exam room while observing appropriate contact time as indicated for disinfecting solutions.    CC/Reason for Visit: Left ear fullness  HPI: [name] is a 73 y.o. female who is coming in today for the above mentioned reasons.  For about 3 days she has been experiencing left ear fullness, with decreased hearing, feels like fluid is moving around inside her ear and sometimes is a little dizzy.  She has not had any fevers.  She has been having some frontal and maxillary sinus pain.  She went to the dentist 2 weeks ago and were told "her sinuses were full".  She takes Zyrtec daily and does sinus lavage.  Since I last saw her she had her right total knee replacement, she tells me surgery went well, however she had a complicated postoperative course requiring grafting of the skin with assistance of plastic surgery.   Past Medical/Surgical History: Past Medical History:  Diagnosis Date  . Allergies   . Bruises easily   . Complication of anesthesia   . Crohn's disease ([place])    GI Dr Beulah Gandy in [place], [place]   . Deviated septum   . GERD (gastroesophageal reflux disease)   . Headache    occ   . Hiatal hernia   . HTN (hypertension)    pcp Dr Margaretann Loveless  in [place], [place]   . Neuropathy    fingers,periodically   . Osteoarthritis   . Pancreatic insufficiency   . PONV (postoperative nausea and vomiting)   . Rheumatoid arthritis ([place])   . Scoliosis   . Squamous cell carcinoma of arm    left   . Whooping cough with pneumonia    2017    Past Surgical History:  Procedure Laterality Date  . CATARACT EXTRACTION, BILATERAL  2018   with IOC implant   . COLONOSCOPY    . DEBRIDEMENT AND CLOSURE WOUND Right 10/01/2018   Procedure:  Excision of right knee wound with placement of ACell and Pravena;  Surgeon: Wallace Going, DO;  Location: [facility];  Service: Plastics;  Laterality: Right;  30 min  . JOINT REPLACEMENT    . knee fracture     . NASAL FRACTURE SURGERY     needed b/c she was getting frequent sinus infections   . 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 UPPER GASTROINTESTINAL ENDOSCOPY      Social History:  reports that she quit smoking about 30 years ago. Her smoking use included cigarettes. She quit after 30.00 years of use. She has never used smokeless tobacco. She reports previous alcohol use. She reports that she does not use drugs.  Allergies: Allergies  Allergen Reactions  . Adhesive [Tape]   . Aspirin     Intestinal Bleeding  . Ciprofloxacin Nausea And Vomiting  . Codeine Nausea And Vomiting  . Demerol [Meperidine Hcl] Nausea And Vomiting  . Lactose Intolerance (Gi) Other (See Comments)    Pt has Crohn's   . Latex     Redness and skin peeling  . Other     Nuts-itching in throat  Seeds-stomach issues with chrons    .  Septra [Sulfamethoxazole-Trimethoprim] Nausea Only    Family History:  Family History  Problem Relation Age of Onset  . Diabetes Father      Current Outpatient Medications:  .  calcium carbonate (OSCAL) 1500 (600 Ca) MG TABS tablet, Take 600 mg of elemental calcium by mouth 2 (two) times daily with a meal., Disp: , Rfl:  .  cetirizine (ZYRTEC) 10 MG tablet, Take 10 mg by mouth daily., Disp: , Rfl:  .  Cholecalciferol (VITAMIN D) 50 MCG (2000 UT) CAPS, Take 2,000 Units by mouth daily., Disp: , Rfl:  .  dicyclomine (BENTYL) 20 MG tablet, Take 1 tablet (20 mg total) by mouth 4 (four) times daily as needed for spasms., Disp: 360 tablet, Rfl: 1 .  DULoxetine (CYMBALTA) 60 MG capsule, Take 60 mg by mouth 2 (two) times daily.,  Disp: , Rfl:  .  lipase/protease/amylase (CREON) 12000 units CPEP capsule, Take 2 capsules (24,000 Units total) by mouth 3 (three) times daily with meals., Disp: 540 capsule, Rfl: 1 .  loperamide (IMODIUM) 2 MG capsule, Take 1 capsule (2 mg total) by mouth 3 (three) times daily as needed for diarrhea or loose stools., Disp: 90 capsule, Rfl: 1 .  losartan (COZAAR) 50 MG tablet, Take 50 mg by mouth daily., Disp: , Rfl:  .  mesalamine (LIALDA) 1.2 g EC tablet, Take 4 tablets (4.8 g total) by mouth daily with breakfast., Disp: 360 tablet, Rfl: 3 .  Multiple Vitamin (MULTIVITAMIN WITH MINERALS) TABS tablet, Take 1 tablet by mouth daily., Disp: , Rfl:  .  omeprazole (PRILOSEC) 40 MG capsule, Take 1 capsule (40 mg total) by mouth 2 (two) times daily., Disp: 180 capsule, Rfl: 3 .  ondansetron (ZOFRAN) 4 MG tablet, Take 1 tablet (4 mg total) by mouth every 8 (eight) hours as needed for nausea or vomiting., Disp: 90 tablet, Rfl: 1 .  Pancrelipase, Lip-Prot-Amyl, (CREON) 24000-76000 units CPEP, Take 2 capsules with each meal (3 times a day), Disp: 180 capsule, Rfl: 3 .  triamcinolone cream (KENALOG) 0.1 %, Apply 1 application topically 2 (two) times daily., Disp: 45 g, Rfl: 0 .  amoxicillin-clavulanate (AUGMENTIN) 875-125 MG tablet, Take 1 tablet by mouth 2 (two) times daily for 7 days., Disp: 14 tablet, Rfl: 0  Review of Systems:  Constitutional: Denies fever, chills, diaphoresis, appetite change and fatigue.  HEENT: Denies photophobia, eye pain, redness, sore throat, rhinorrhea, sneezing, mouth sores, trouble swallowing, neck pain, neck stiffness and tinnitus.   Respiratory: Denies SOB, DOE,  chest tightness,  and wheezing.   Cardiovascular: Denies chest pain, palpitations and leg swelling.  Gastrointestinal: Denies nausea, vomiting, abdominal pain, diarrhea, constipation, blood in stool and abdominal distention.  Genitourinary: Denies dysuria, urgency, frequency, hematuria, flank pain and difficulty  urinating.  Endocrine: Denies: hot or cold intolerance, sweats, changes in hair or nails, polyuria, polydipsia. Musculoskeletal: Denies myalgias, back pain, joint swelling, arthralgias and gait problem.  Skin: Denies pallor, rash and wound.  Neurological: Denies dizziness, seizures, syncope, weakness, light-headedness, numbness and headaches.  Hematological: Denies adenopathy. Easy bruising, personal or family bleeding history  Psychiatric/Behavioral: Denies suicidal ideation, mood changes, confusion, nervousness, sleep disturbance and agitation    Physical Exam: Vitals:   12/17/18 1532  BP: 130/70  Pulse: (!) 105  Temp: 98 F (36.7 C)  TempSrc: Temporal  SpO2: 91%  Weight: 133 lb 14.4 oz (60.7 kg)    Body mass index is 26.15 kg/m.   Constitutional: NAD, calm, comfortable Eyes: PERRL, lids and conjunctivae normal ENMT: Mucous membranes  are moist. Tympanic membrane is pearly white, no erythema or bulging on the right, left is erythematous with air-fluid level and mild bulging. Neck: normal, supple, no masses, no thyromegaly Respiratory:  no wheezing, prominent left basilar crackles. Normal respiratory effort. No accessory muscle use.  Cardiovascular: Regular rate and rhythm, no murmurs / rubs / gallops. No extremity edema. 2+ pedal pulses. No carotid bruits.  Psychiatric: Normal judgment and insight. Alert and oriented x 3. Normal mood.    Impression and Plan:  Non-recurrent acute suppurative otitis media of left ear without spontaneous rupture of tympanic membrane  - Plan: amoxicillin-clavulanate (AUGMENTIN) 875-125 MG tablet for 10 days, she knows to return to clinic if any issues.  Have also advised her to continue Zyrtec and add Mucinex D for decongestion.  S/P right TKA -In July, noted  Respiratory crackles at left lung base -This was noted on lung auscultation today, she does not have any cough or shortness of breath. - Plan: DG Chest 2 View, DG Chest 2  View  Crohn's disease -Stable, no current flares, she is requesting a CBC.    Patient Instructions  -Nice seeing you today!!  -Start augmentin twice daily for 7 days.  -Chest xray today; will call you with results.   Otitis Media, Adult  Otitis media means that the middle ear is red and swollen (inflamed) and full of fluid. The condition usually goes away on its own. Follow these instructions at home:  Take over-the-counter and prescription medicines only as told by your doctor.  If you were prescribed an antibiotic medicine, take it as told by your doctor. Do not stop taking the antibiotic even if you start to feel better.  Keep all follow-up visits as told by your doctor. This is important. Contact a doctor if:  You have bleeding from your nose.  There is a lump on your neck.  You are not getting better in 5 days.  You feel worse instead of better. Get help right away if:  You have pain that is not helped with medicine.  You have swelling, redness, or pain around your ear.  You get a stiff neck.  You cannot move part of your face (paralyzed).  You notice that the bone behind your ear hurts when you touch it.  You get a very bad headache. Summary  Otitis media means that the middle ear is red, swollen, and full of fluid.  This condition usually goes away on its own. In some cases, treatment may be needed.  If you were prescribed an antibiotic medicine, take it as told by your doctor. This information is not intended to replace advice given to you by your health care provider. Make sure you discuss any questions you have with your health care provider. Document Released: 06/12/2007 Document Revised: 12/06/2016 Document Reviewed: 01/15/2016 Elsevier Patient Education  2020 [place], MD [facility]

## 2018-12-17 NOTE — Progress Notes (Signed)
Subjective:     Patient ID: [name], female    DOB: Dec 14, 1945, 73 y.o.   MRN: [id number]  Chief Complaint  Patient presents with  . Follow-up    2 weeks on excision of (R) knee wound w/placement of Acell and Pravena   HPI: The patient is a 73 y.o. female here for follow-up on her right knee wound s/p TKA.  [name] is doing really well.  Her right knee wound has completely epithelialized and it is beginning to flake off a scab.  There is no opening at this time.  There is no signs of infection. There is no sign of dehiscence at this time.  She has good range of motion of her knee.  She reports she has not seen her orthopedic or started PT again yet.   She has not had any fevers, chills, nausea, vomiting.  She is very pleased with her progress.  She has multiple areas of dry skin and skin lesions over her LE and hands, she reports she has been treated for this in the past by dermatology and recently saw dermatology a few days ago, but they did not talk about LE leg lesions/dry skin. She has a full body skin check scheduled for early next year.  Review of Systems  Constitutional: Positive for activity change. Negative for appetite change, chills, diaphoresis, fatigue and fever.  Respiratory: Negative for shortness of breath.   Cardiovascular: Negative for chest pain and leg swelling.  Gastrointestinal: Negative for nausea and vomiting.  Skin: Positive for color change, rash and wound.  Neurological: Negative.      Objective:   Vital Signs BP (!) 187/84 (BP Location: Left Arm, Patient Position: Sitting, Cuff Size: Normal)   Pulse 93   Temp 97.7 F (36.5 C) (Temporal)   Ht 5' (1.524 m)   Wt 133 lb 9.6 oz (60.6 kg)   SpO2 92%   BMI 26.09 kg/m  Vital Signs and Nursing Note Reviewed Physical Exam  Constitutional: She is oriented to person, place, and time and well-developed, well-nourished, and in no distress.  HENT:  Head: Normocephalic and atraumatic.   Cardiovascular: Normal rate.  Pulmonary/Chest: Effort normal.  Musculoskeletal:        General: Normal range of motion.  Neurological: She is alert and oriented to person, place, and time. Gait normal.  Skin: Skin is warm and dry. No rash noted. She is not diaphoretic. There is erythema. No pallor.     Multiple skin lesions/dry skin over LE, R > L. Baseline, per pt.  Psychiatric: Mood and affect normal.    Assessment/Plan:     ICD-10-CM   1. S/P right TKA  Z96.651   2. Open wound of right knee, subsequent encounter  S81.001D    [name] is doing well. She has completely epithelialized her wound and it is beginning to flake off a scab.   She is scheduled to see ortho soon. Recommend Vaseline to the scab for ~ 1 month PRN.  Recommend follow up with dermatology for eval of LE dry skin/lesions - she has an appointment scheduled for full body skin check early 2021.  1 month appt scheduled for patient to return if needed. Suspect scab will flake off in next 2 weeks and she will not need to return.   Call with questions or concerns. Pictures were obtained of the patient and placed in the chart with the patient's or guardian's permission.    Carola Rhine Carson Meche, PA-C 12/17/2018, 10:38  AM

## 2018-12-18 LAB — CBC WITH DIFFERENTIAL/PLATELET
Basophils Absolute: 0.1 10*3/uL (ref 0.0–0.1)
Basophils Relative: 0.8 % (ref 0.0–3.0)
Eosinophils Absolute: 0.7 10*3/uL (ref 0.0–0.7)
Eosinophils Relative: 6.6 % — ABNORMAL HIGH (ref 0.0–5.0)
HCT: 37.6 % (ref 36.0–46.0)
Hemoglobin: 12.5 g/dL (ref 12.0–15.0)
Lymphocytes Relative: 17.8 % (ref 12.0–46.0)
Lymphs Abs: 1.8 10*3/uL (ref 0.7–4.0)
MCHC: 33.2 g/dL (ref 30.0–36.0)
MCV: 90.7 fl (ref 78.0–100.0)
Monocytes Absolute: 0.6 10*3/uL (ref 0.1–1.0)
Monocytes Relative: 6.5 % (ref 3.0–12.0)
Neutro Abs: 6.9 10*3/uL (ref 1.4–7.7)
Neutrophils Relative %: 68.3 % (ref 43.0–77.0)
Platelets: 400 10*3/uL (ref 150.0–400.0)
RBC: 4.15 Mil/uL (ref 3.87–5.11)
RDW: 15.8 % — ABNORMAL HIGH (ref 11.5–15.5)
WBC: 10.1 10*3/uL (ref 4.0–10.5)

## 2018-12-28 ENCOUNTER — Telehealth: Payer: Self-pay | Admitting: Internal Medicine

## 2018-12-28 MED ORDER — LOPERAMIDE HCL 2 MG PO CAPS: 2.0000 mg | ORAL_CAPSULE | Freq: Three times a day (TID) | ORAL | 2 refills | Status: DC | PRN

## 2018-12-28 NOTE — Telephone Encounter (Signed)
Refilled Imodium; patient needs to talk to pcp about Losartan.

## 2018-12-28 NOTE — Telephone Encounter (Signed)
Pt needs rf of loperamide sent to [facility] on [address]. She is also requesting prescription for losartan 39m. Pt is not sure if Dr. PHenrene Pastorhas prescribed this before. If not, she is requesting prescription. She needs both medications for a 90-day supply.

## 2018-12-30 ENCOUNTER — Other Ambulatory Visit: Payer: Self-pay | Admitting: Internal Medicine

## 2018-12-30 MED ORDER — LOSARTAN POTASSIUM 50 MG PO TABS: 50.0000 mg | ORAL_TABLET | Freq: Every day | ORAL | 1 refills | Status: DC

## 2018-12-30 NOTE — Telephone Encounter (Signed)
losartan (COZAAR) 50 MG tablet    Patient requesting refill. Patient has two tablets left.    [facility]  [id number] [place] [place]  [phone] Fax:  [phone]

## 2018-12-30 NOTE — Telephone Encounter (Signed)
Refill request of losartan 50 mg.  Prescribed by historical provider and needs dispense values to sign the order.

## 2019-01-04 ENCOUNTER — Telehealth: Payer: Self-pay | Admitting: Internal Medicine

## 2019-01-04 NOTE — Telephone Encounter (Signed)
Left message on machine to call back  

## 2019-01-04 NOTE — Telephone Encounter (Signed)
Pt reported that she is experiencing "terrible diarrhea"  And would like to discuss.

## 2019-01-05 ENCOUNTER — Other Ambulatory Visit: Payer: Self-pay

## 2019-01-05 DIAGNOSIS — K591 Functional diarrhea: Secondary | ICD-10-CM

## 2019-01-05 NOTE — Telephone Encounter (Signed)
Pt said she is calling your back. She is still having severe diarrhea [phone]

## 2019-01-05 NOTE — Telephone Encounter (Signed)
Called patient and she will come in tomorrow for CBC, CMET and stool for C-Diff. Told her Dr. Carlean Purl would like her to take 3 capsules of her pancreas enzymes with each meal. And to stay hydrated. She agrees with the above

## 2019-01-05 NOTE — Telephone Encounter (Signed)
Recent antibiotics noted  Could be c difff  Also on pancreas enxyme supplements   Plan:  1) Stool C diff PCR test 2) CBC, CMET 3) take 3 capsules of pancreas enzymes with meals 4) continue hydration  Once I see results will call back

## 2019-01-05 NOTE — Telephone Encounter (Signed)
Called patient and she states for 2 weeks she has been having diarrhea 5-10 times a day. She has taken Imodium 1-2 doses a day (some days) with no improvement. No blood. Looks a little oily. Is either total liquid or a few solids pieces of stool. No fever. No n/v. She is staying hydrated but only able to eat toast. As DOD please advise

## 2019-01-06 ENCOUNTER — Other Ambulatory Visit (INDEPENDENT_AMBULATORY_CARE_PROVIDER_SITE_OTHER): Payer: Medicare Other

## 2019-01-06 DIAGNOSIS — K591 Functional diarrhea: Secondary | ICD-10-CM

## 2019-01-06 LAB — CBC WITH DIFFERENTIAL/PLATELET
Basophils Absolute: 0.1 10*3/uL (ref 0.0–0.1)
Basophils Relative: 1.4 % (ref 0.0–3.0)
Eosinophils Absolute: 0.7 10*3/uL (ref 0.0–0.7)
Eosinophils Relative: 7.3 % — ABNORMAL HIGH (ref 0.0–5.0)
HCT: 36.6 % (ref 36.0–46.0)
Hemoglobin: 12.4 g/dL (ref 12.0–15.0)
Lymphocytes Relative: 13.2 % (ref 12.0–46.0)
Lymphs Abs: 1.2 10*3/uL (ref 0.7–4.0)
MCHC: 34 g/dL (ref 30.0–36.0)
MCV: 89.5 fl (ref 78.0–100.0)
Monocytes Absolute: 0.5 10*3/uL (ref 0.1–1.0)
Monocytes Relative: 5.8 % (ref 3.0–12.0)
Neutro Abs: 6.4 10*3/uL (ref 1.4–7.7)
Neutrophils Relative %: 72.3 % (ref 43.0–77.0)
Platelets: 399 10*3/uL (ref 150.0–400.0)
RBC: 4.09 Mil/uL (ref 3.87–5.11)
RDW: 15.6 % — ABNORMAL HIGH (ref 11.5–15.5)
WBC: 8.9 10*3/uL (ref 4.0–10.5)

## 2019-01-06 LAB — COMPREHENSIVE METABOLIC PANEL
ALT: 10 U/L (ref 0–35)
AST: 16 U/L (ref 0–37)
Albumin: 4 g/dL (ref 3.5–5.2)
Alkaline Phosphatase: 79 U/L (ref 39–117)
BUN: 21 mg/dL (ref 6–23)
CO2: 25 mEq/L (ref 19–32)
Calcium: 9.2 mg/dL (ref 8.4–10.5)
Chloride: 108 mEq/L (ref 96–112)
Creatinine, Ser: 0.97 mg/dL (ref 0.40–1.20)
GFR: 56.21 mL/min — ABNORMAL LOW (ref >60.00)
Glucose, Bld: 96 mg/dL (ref 70–99)
Potassium: 4.2 mEq/L (ref 3.5–5.1)
Sodium: 140 mEq/L (ref 135–145)
Total Bilirubin: 0.3 mg/dL (ref 0.2–1.2)
Total Protein: 7.2 g/dL (ref 6.0–8.3)

## 2019-01-07 ENCOUNTER — Telehealth: Payer: Self-pay

## 2019-01-07 NOTE — Telephone Encounter (Signed)
-----   Message from Gatha Mayer, MD sent at 01/06/2019  4:51 PM EST ----- Let her know her blood work is ok Waiting on C diff test

## 2019-01-07 NOTE — Telephone Encounter (Signed)
See result note.  

## 2019-01-07 NOTE — Telephone Encounter (Signed)
Patient returned your call.

## 2019-01-07 NOTE — Telephone Encounter (Signed)
Left message to please call back

## 2019-01-19 ENCOUNTER — Telehealth: Payer: Self-pay

## 2019-01-19 NOTE — Telephone Encounter (Signed)

## 2019-01-20 ENCOUNTER — Encounter: Payer: Self-pay | Admitting: Surgical

## 2019-01-20 ENCOUNTER — Other Ambulatory Visit: Payer: Self-pay

## 2019-01-20 ENCOUNTER — Ambulatory Visit (INDEPENDENT_AMBULATORY_CARE_PROVIDER_SITE_OTHER): Payer: Medicare Other | Admitting: Surgical

## 2019-01-20 VITALS — BP 164/92 | HR 85 | Temp 97.3°F | Ht 60.0 in | Wt 135.2 lb

## 2019-01-20 DIAGNOSIS — Z96651 Presence of right artificial knee joint: Secondary | ICD-10-CM

## 2019-01-20 DIAGNOSIS — S81001D Unspecified open wound, right knee, subsequent encounter: Secondary | ICD-10-CM

## 2019-01-20 NOTE — Progress Notes (Signed)
   Subjective:     Patient ID: [name], female    DOB: Aug 05, 1945, 74 y.o.   MRN: [id number]  Chief Complaint  Patient presents with  . Follow-up    1 month on (R) knee wound    HPI: The patient is a 74 y.o. female here for follow-up on her right knee wound status post TKA.  She is doing well.  The area has healed.  There is a small blister over the healing incision/wound.  Does not appear infected.  It is approximately 2 x 2 mm.  She continues to have good range of motion of her right knee.  She is planning to go see Dr. Alvan Dame to speak more about getting her left knee replaced.  She has overall been feeling well lately. She has multiple skin lesions over her lower extremity and hands, she is going to see dermatology this week.  She has a history of squamous cell carcinoma.  It seems she is on top of her medical care in regards to [facility].  Review of Systems  Constitutional: Negative for chills and fever.  Respiratory: Negative for cough and shortness of breath.   Cardiovascular: Negative.   Gastrointestinal: Negative.   Musculoskeletal: Positive for arthralgias.  Skin: Positive for color change. Negative for wound.   Objective:   Vital Signs BP (!) 164/92 (BP Location: Right Arm, Patient Position: Sitting, Cuff Size: Normal)   Pulse 85   Temp (!) 97.3 F (36.3 C) (Temporal)   Ht 5' (1.524 m)   Wt 135 lb 3.2 oz (61.3 kg)   SpO2 90%   BMI 26.40 kg/m  Vital Signs and Nursing Note Reviewed  Physical Exam  Constitutional: She is oriented to person, place, and time and well-developed, well-nourished, and in no distress.  HENT:  Head: Normocephalic and atraumatic.  Cardiovascular: Normal rate.  Pulses:      Dorsalis pedis pulses are 2+ on the right side and 2+ on the left side.  Pulmonary/Chest: Effort normal.  Musculoskeletal:        General: Tenderness present. Normal range of motion.     Right knee: No erythema. Normal range of motion. Tenderness (Right medial)  present.       Legs:  Neurological: She is alert and oriented to person, place, and time. Gait normal.  Skin: Skin is warm and dry. No rash noted. She is not diaphoretic. No erythema.  Psychiatric: Mood and affect normal.      Assessment/Plan:     ICD-10-CM   1. S/P right TKA  Z96.651   2. Open wound of right knee, subsequent encounter  S81.001D     No further follow up necessary. Patients wound is well healed and she has tolerated normal activities without any issue.  She is following up with dermatology for further eval of her skin lesions. She is scheduled to see ortho for eval and planning for L knee replacement  Call with questions or concerns.   Carola Rhine Tait Balistreri, PA-C 01/20/2019, 10:09 AM

## 2019-01-28 ENCOUNTER — Encounter: Payer: Self-pay | Admitting: Internal Medicine

## 2019-01-28 ENCOUNTER — Other Ambulatory Visit: Payer: Self-pay

## 2019-01-28 ENCOUNTER — Ambulatory Visit (INDEPENDENT_AMBULATORY_CARE_PROVIDER_SITE_OTHER): Payer: Medicare Other | Admitting: Internal Medicine

## 2019-01-28 VITALS — BP 160/90 | HR 77 | Temp 97.6°F | Wt 133.3 lb

## 2019-01-28 DIAGNOSIS — K449 Diaphragmatic hernia without obstruction or gangrene: Secondary | ICD-10-CM | POA: Diagnosis not present

## 2019-01-28 DIAGNOSIS — K219 Gastro-esophageal reflux disease without esophagitis: Secondary | ICD-10-CM

## 2019-01-28 DIAGNOSIS — H9202 Otalgia, left ear: Secondary | ICD-10-CM | POA: Diagnosis not present

## 2019-01-28 DIAGNOSIS — B373 Candidiasis of vulva and vagina: Secondary | ICD-10-CM | POA: Diagnosis not present

## 2019-01-28 DIAGNOSIS — B3731 Acute candidiasis of vulva and vagina: Secondary | ICD-10-CM

## 2019-01-28 DIAGNOSIS — I10 Essential (primary) hypertension: Secondary | ICD-10-CM

## 2019-01-28 DIAGNOSIS — R05 Cough: Secondary | ICD-10-CM

## 2019-01-28 DIAGNOSIS — R059 Cough, unspecified: Secondary | ICD-10-CM

## 2019-01-28 MED ORDER — LOSARTAN POTASSIUM 100 MG PO TABS: 100.0000 mg | ORAL_TABLET | Freq: Every day | ORAL | 1 refills | Status: DC

## 2019-01-28 MED ORDER — OMEPRAZOLE 40 MG PO CPDR: 40.0000 mg | DELAYED_RELEASE_CAPSULE | Freq: Two times a day (BID) | ORAL | 3 refills | Status: DC

## 2019-01-28 MED ORDER — FLUCONAZOLE 150 MG PO TABS: 150.0000 mg | ORAL_TABLET | Freq: Once | ORAL | 0 refills | Status: AC

## 2019-01-28 NOTE — Progress Notes (Signed)
Established Patient Office Visit     This visit occurred during the SARS-CoV-2 public health emergency.  Safety protocols were in place, including screening questions prior to the visit, additional usage of staff PPE, and extensive cleaning of exam room while observing appropriate contact time as indicated for disinfecting solutions.    CC/Reason for Visit: To discuss several acute complaints  HPI: [name] is a 74 y.o. female who is coming in today for the above mentioned reasons.  Issues she would like to address today are:  1.  She continues to have left ear pain and fullness.  She was diagnosed with left otitis media in December and treated with 10 days of Augmentin.  She states that over the past 2 days the pain has improved but fullness remains, she has had some decrease in hearing.  2.  She is concerned about continued elevated blood pressure readings at home.  3.  She recently took some amoxicillin for diarrhea and since then has been having some itching and white vaginal discharge, this is typical for her when she has a yeast infection.  4.  She has been having increasing issues with diarrhea and her pancreatic insufficiency.  She increased her Creon from 2 to 3 tablets with every meal but still having issues.  5.  She continues to have a significant cough despite maximal PPI therapy.  Cough is worse at nighttime.  She has a large hiatal hernia.  There have been discussions when she was living in [place] about having this surgically repaired.  6.  She needs medical clearance for her upcoming knee surgery that has been delayed due to the COVID-19 pandemic.   Past Medical/Surgical History: Past Medical History:  Diagnosis Date  . Allergies   . Bruises easily   . Complication of anesthesia   . Crohn's disease ([place])    GI Dr Beulah Gandy in [place], [place]   . Deviated septum   . GERD (gastroesophageal reflux disease)   . Headache    occ   . Hiatal  hernia   . HTN (hypertension)    pcp Dr Margaretann Loveless  in [place], [place]   . Neuropathy    fingers,periodically   . Osteoarthritis   . Pancreatic insufficiency   . PONV (postoperative nausea and vomiting)   . Rheumatoid arthritis ([place])   . Scoliosis   . Squamous cell carcinoma of arm    left   . Whooping cough with pneumonia    2017    Past Surgical History:  Procedure Laterality Date  . CATARACT EXTRACTION, BILATERAL  2018   with IOC implant   . COLONOSCOPY    . DEBRIDEMENT AND CLOSURE WOUND Right 10/01/2018   Procedure: Excision of right knee wound with placement of ACell and Pravena;  Surgeon: Wallace Going, DO;  Location: [facility];  Service: Plastics;  Laterality: Right;  30 min  . JOINT REPLACEMENT    . knee fracture     . NASAL FRACTURE SURGERY     needed b/c she was getting frequent sinus infections   . 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 UPPER GASTROINTESTINAL ENDOSCOPY      Social History:  reports that she quit smoking about 31 years ago. Her smoking use included cigarettes. She quit after 30.00 years of use. She has never used  smokeless tobacco. She reports previous alcohol use. She reports that she does not use drugs.  Allergies: Allergies  Allergen Reactions  . Adhesive [Tape]   . Aspirin     Intestinal Bleeding  . Ciprofloxacin Nausea And Vomiting  . Codeine Nausea And Vomiting  . Demerol [Meperidine Hcl] Nausea And Vomiting  . Lactose Intolerance (Gi) Other (See Comments)    Pt has Crohn's   . Latex     Redness and skin peeling  . Other     Nuts-itching in throat  Seeds-stomach issues with chrons    . Septra [Sulfamethoxazole-Trimethoprim] Nausea Only    Family History:  Family History  Problem Relation Age of Onset  . Diabetes Father      Current Outpatient Medications:  .   calcium carbonate (OSCAL) 1500 (600 Ca) MG TABS tablet, Take 600 mg of elemental calcium by mouth 2 (two) times daily with a meal., Disp: , Rfl:  .  cetirizine (ZYRTEC) 10 MG tablet, Take 10 mg by mouth daily., Disp: , Rfl:  .  Cholecalciferol (VITAMIN D) 50 MCG (2000 UT) CAPS, Take 2,000 Units by mouth daily., Disp: , Rfl:  .  dicyclomine (BENTYL) 20 MG tablet, Take 1 tablet (20 mg total) by mouth 4 (four) times daily as needed for spasms., Disp: 360 tablet, Rfl: 1 .  DULoxetine (CYMBALTA) 60 MG capsule, Take 60 mg by mouth 2 (two) times daily., Disp: , Rfl:  .  lipase/protease/amylase (CREON) 12000 units CPEP capsule, Take 2 capsules (24,000 Units total) by mouth 3 (three) times daily with meals., Disp: 540 capsule, Rfl: 1 .  loperamide (IMODIUM) 2 MG capsule, Take 1 capsule (2 mg total) by mouth 3 (three) times daily as needed for diarrhea or loose stools., Disp: 90 capsule, Rfl: 2 .  losartan (COZAAR) 100 MG tablet, Take 1 tablet (100 mg total) by mouth daily., Disp: 90 tablet, Rfl: 1 .  mesalamine (LIALDA) 1.2 g EC tablet, Take 4 tablets (4.8 g total) by mouth daily with breakfast., Disp: 360 tablet, Rfl: 3 .  Multiple Vitamin (MULTIVITAMIN WITH MINERALS) TABS tablet, Take 1 tablet by mouth daily., Disp: , Rfl:  .  omeprazole (PRILOSEC) 40 MG capsule, Take 1 capsule (40 mg total) by mouth 2 (two) times daily., Disp: 180 capsule, Rfl: 3 .  ondansetron (ZOFRAN) 4 MG tablet, Take 1 tablet (4 mg total) by mouth every 8 (eight) hours as needed for nausea or vomiting., Disp: 90 tablet, Rfl: 1 .  Pancrelipase, Lip-Prot-Amyl, (CREON) 24000-76000 units CPEP, Take 2 capsules with each meal (3 times a day), Disp: 180 capsule, Rfl: 3 .  triamcinolone cream (KENALOG) 0.1 %, Apply 1 application topically 2 (two) times daily., Disp: 45 g, Rfl: 0 .  fluconazole (DIFLUCAN) 150 MG tablet, Take 1 tablet (150 mg total) by mouth once for 1 dose., Disp: 1 tablet, Rfl: 0  Review of Systems:  Constitutional: Denies  fever, chills, diaphoresis, appetite change and fatigue.  HEENT: Denies photophobia, eye pain, redness,sore throat, rhinorrhea, sneezing, mouth sores, trouble swallowing, neck pain, neck stiffness and tinnitus.   Respiratory: Denies SOB, DOE, cough, chest tightness,  and wheezing.   Cardiovascular: Denies chest pain, palpitations and leg swelling.  Gastrointestinal: Denies nausea, vomiting, abdominal pain, diarrhea, constipation, blood in stool and abdominal distention.  Genitourinary: Denies dysuria, urgency, frequency, hematuria, flank pain and difficulty urinating.  Endocrine: Denies: hot or cold intolerance, sweats, changes in hair or nails, polyuria, polydipsia. Musculoskeletal: Denies myalgias, back pain, joint swelling, arthralgias and gait problem.  Skin: Denies pallor, rash and wound.  Neurological: Denies dizziness, seizures, syncope, weakness, light-headedness, numbness and headaches.  Hematological: Denies adenopathy. Easy bruising, personal or family bleeding history  Psychiatric/Behavioral: Denies suicidal ideation, mood changes, confusion, nervousness, sleep disturbance and agitation    Physical Exam: Vitals:   01/28/19 0932 01/28/19 1026  BP: (!) 150/100 (!) 160/90  Pulse: 77   Temp: 97.6 F (36.4 C)   TempSrc: Temporal   SpO2: 94%   Weight: 133 lb 4.8 oz (60.5 kg)     Body mass index is 26.03 kg/m.   Constitutional: NAD, calm, comfortable Eyes: PERRL, lids and conjunctivae normal ENMT: Mucous membranes are moist. Posterior pharynx clear of any exudate or lesions. Normal dentition. Tympanic membrane is pearly white, no erythema or bulging. Neck: normal, supple, no masses, no thyromegaly Respiratory: clear to auscultation bilaterally, no wheezing, no crackles. Normal respiratory effort. No accessory muscle use.  Cardiovascular: Regular rate and rhythm, no murmurs / rubs / gallops.  Neurologic: Grossly intact and nonfocal Psychiatric: Normal judgment and insight.  Alert and oriented x 3. Normal mood.    Impression and Plan:  Left ear pain -No evidence of otitis media today. -She does have significant sinus congestion which may be contributing. -Have advised Mucinex D twice daily for 10 days. -She will contact me if no improvement to consider ENT referral.  Vaginal yeast infection  - Plan: fluconazole (DIFLUCAN) 150 MG tablet  Cough Hiatal hernia Gastroesophageal reflux disease without esophagitis  -Suspect nighttime cough may be due to significant reflux and GERD due to her large hiatal hernia. -She is on 40 mg of omeprazole twice daily. -There has been some talk about surgical repair for her large hiatal hernia in the past. -She has a follow-up appointment already scheduled with GI for tomorrow, have advised her to bring these issues up.  Essential hypertension  -Uncontrolled, increase losartan from 50 to 100 mg. -She will return in 6 weeks for follow-up.   Patient Instructions  -Nice seeing you today!!  -Increase losartan to 100 mg daily.  -Diflucan 150 mg x 1 dose.  -Schedule 6 week follow up for blood pressure check.     Lelon Frohlich, MD  Primary Care at [facility]

## 2019-01-28 NOTE — Patient Instructions (Signed)
-  Nice seeing you today!!  -Increase losartan to 100 mg daily.  -Diflucan 150 mg x 1 dose.  -Schedule 6 week follow up for blood pressure check.

## 2019-01-29 ENCOUNTER — Ambulatory Visit (INDEPENDENT_AMBULATORY_CARE_PROVIDER_SITE_OTHER): Payer: Medicare Other | Admitting: Internal Medicine

## 2019-01-29 ENCOUNTER — Encounter: Payer: Self-pay | Admitting: Internal Medicine

## 2019-01-29 VITALS — BP 148/90 | HR 84 | Temp 98.7°F | Ht 59.25 in | Wt 132.4 lb

## 2019-01-29 DIAGNOSIS — K219 Gastro-esophageal reflux disease without esophagitis: Secondary | ICD-10-CM | POA: Diagnosis not present

## 2019-01-29 DIAGNOSIS — K449 Diaphragmatic hernia without obstruction or gangrene: Secondary | ICD-10-CM

## 2019-01-29 DIAGNOSIS — K5909 Other constipation: Secondary | ICD-10-CM

## 2019-01-29 DIAGNOSIS — K591 Functional diarrhea: Secondary | ICD-10-CM

## 2019-01-29 DIAGNOSIS — R4702 Dysphasia: Secondary | ICD-10-CM

## 2019-01-29 DIAGNOSIS — R110 Nausea: Secondary | ICD-10-CM

## 2019-01-29 DIAGNOSIS — K50919 Crohn's disease, unspecified, with unspecified complications: Secondary | ICD-10-CM

## 2019-01-29 DIAGNOSIS — K8689 Other specified diseases of pancreas: Secondary | ICD-10-CM

## 2019-01-29 NOTE — Patient Instructions (Signed)
You have been scheduled for an Upper GI Series at [facility]. Your appointment is on 02/02/2019 at 9:30am. Please arrive 15 minutes prior to your test for registration. Make sure not to eat or drink anything after midnight on the night before your test. If you need to reschedule, please call radiology at [phone]. ________________________________________________________________ An upper GI series uses x rays to help diagnose problems of the upper GI tract, which includes the esophagus, stomach, and duodenum. The duodenum is the first part of the small intestine. An upper GI series is conducted by a radiology technologist or a radiologist--a doctor who specializes in x-ray imaging--at a hospital or outpatient center. While sitting or standing in front of an x-ray machine, the patient drinks barium liquid, which is often white and has a chalky consistency and taste. The barium liquid coats the lining of the upper GI tract and makes signs of disease show up more clearly on x rays. X-ray video, called fluoroscopy, is used to view the barium liquid moving through the esophagus, stomach, and duodenum. Additional x rays and fluoroscopy are performed while the patient lies on an x-ray table. To fully coat the upper GI tract with barium liquid, the technologist or radiologist may press on the abdomen or ask the patient to change position. Patients hold still in various positions, allowing the technologist or radiologist to take x rays of the upper GI tract at different angles. If a technologist conducts the upper GI series, a radiologist will later examine the images to look for problems.  This test typically takes about 1 hour to complete. __________________________________________________________________

## 2019-01-29 NOTE — Progress Notes (Signed)
HISTORY OF PRESENT ILLNESS:  [name] is a 74 y.o. female with a myriad of GI issues who established care here, from [place], October 08, 2018.  A summary of her outside history is as follows:  1.  Crohn's colitis diagnosed at the [facility] in 1997.  Last colonoscopy with Dr. Beulah Gandy Jun 05, 2017.  The ileum was normal.  Chronic colitis noted in the left colon.  Diminutive pseudopolyp in the cecum.  Mild internal hemorrhoids.  Biopsies of the cecal polyp were a benign lymphoid aggregate.  Biopsies in the right colon were normal.  Biopsies in the left colon revealed inactive chronic colitis.  The patient was told to take Lialda 4.8 g daily for 8 weeks then 2.4 g daily thereafter.  She tells me that she has been off Lialda for 6 months.  She tells me that she self medicates with amoxicillin periodically for "Crohn's flare". 2.  GERD.  On omeprazole 40 mg daily. 3.  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  All studies in [place]..  Surgery recommended.  Patient has declined.  Last EGD with Dr. Beulah Gandy March 04, 2017.  Noted to have a large hiatal hernia, mild gastritis, and benign gastric polyp. 4.  Chronic diarrhea with intermittent constipation.  Patient has had extensive work-up abnormalities include low pancreatic fecal elastase with normal stool fat.  She has been given the diagnosis of pancreatic insufficiency and treated with Creon.  Mild elevation of gastrin (364) with negative octreotide scan.  Normal 5-hydroxyindoleacetic acid levels.  Normal lactose tolerance test.  Negative C. difficile.  Colon biopsies negative for microscopic colitis.  Negative Pentetriotide scan.  She takes approximately 2 Imodium per day. 5.  Chronic nausea.  Takes Zofran 6.  Intermittent chronic abdominal pain.  Takes dicyclomine.  At the time of her last visit she was advised to resume Lialda (which we  prescribed), omeprazole (prescribed), reflux precautions, Creon per patient request (prescribed), Zofran per patient request (prescribed), dicyclomine per patient request (prescribe).  I declined prescribed amoxicillin for the patient's on-demand use for "Crohn's flare".  She states that her outside physician would prescribe this for her and this helped her diarrhea.  She was felt to have a symptomatic large paraesophageal hernia for which surgery had been recommended.  I recommended a surgical opinion as well.  Otherwise, routine GI follow-up in 1 year.  Patient subsequently contacted the office complaining of diarrhea.  Requested Imodium.  Blood work was obtained and found to be unremarkable.  She was asked to submit stool studies but tells me that by the time she was to go to the laboratory her diarrhea has improved.  She did increase Creon from 2-3 daily.  Since that time she continues with diarrhea alternating with constipation.  Diarrhea predominates.  She continues with swallowing discomfort and postprandial pain in the region of the left shoulder.  She has numerous questions regarding her Crohn's disease, pancreas, reflux, paraesophageal hernia, and chronic abdominal pain.  REVIEW OF SYSTEMS:  All non-GI ROS negative unless otherwise stated in the HPI except for sinus allergy, arthritis, cough, hearing problems, muscle cramps, skin rash, sleeping problems, ankle swelling, shortness of breath  Past Medical History:  Diagnosis Date  . Allergies   . Bruises easily   . Complication of anesthesia   . Crohn's disease ([place])    GI Dr Beulah Gandy in [place], [place]   . Deviated septum   .  GERD (gastroesophageal reflux disease)   . Headache    occ   . Hiatal hernia   . HTN (hypertension)    pcp Dr Margaretann Loveless  in [place], [place]   . Neuropathy    fingers,periodically   . Osteoarthritis   . Pancreatic insufficiency   . PONV (postoperative nausea and vomiting)   . Rheumatoid  arthritis ([place])   . Scoliosis   . Squamous cell carcinoma of arm    left   . Whooping cough with pneumonia    2017    Past Surgical History:  Procedure Laterality Date  . CATARACT EXTRACTION, BILATERAL  2018   with IOC implant   . COLONOSCOPY    . DEBRIDEMENT AND CLOSURE WOUND Right 10/01/2018   Procedure: Excision of right knee wound with placement of ACell and Pravena;  Surgeon: Wallace Going, DO;  Location: [facility];  Service: Plastics;  Laterality: Right;  30 min  . JOINT REPLACEMENT    . knee fracture     . NASAL FRACTURE SURGERY     needed b/c she was getting frequent sinus infections   . 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 UPPER GASTROINTESTINAL ENDOSCOPY      Social History [name]  reports that she quit smoking about 31 years ago. Her smoking use included cigarettes. She quit after 30.00 years of use. She has never used smokeless tobacco. She reports previous alcohol use. She reports that she does not use drugs.  family history includes Diabetes in her father.  Allergies  Allergen Reactions  . Adhesive [Tape]   . Aspirin     Intestinal Bleeding  . Ciprofloxacin Nausea And Vomiting  . Codeine Nausea And Vomiting  . Demerol [Meperidine Hcl] Nausea And Vomiting  . Lactose Intolerance (Gi) Other (See Comments)    Pt has Crohn's   . Latex     Redness and skin peeling  . Other     Nuts-itching in throat  Seeds-stomach issues with chrons    . Septra [Sulfamethoxazole-Trimethoprim] Nausea Only       PHYSICAL EXAMINATION: Vital signs: BP (!) 148/90 (BP Location: Left Arm, Patient Position: Sitting, Cuff Size: Normal)   Pulse 84 Comment: irregular  Temp 98.7 F (37.1 C)   Ht 4' 11.25" (1.505 m) Comment: height measured without shoes  Wt 132 lb 6 oz (60 kg)   BMI 26.51 kg/m   Constitutional:  generally well-appearing, no acute distress Psychiatric: alert and oriented x3, cooperative Eyes: extraocular movements intact, anicteric, conjunctiva pink Mouth: oral pharynx moist, no lesions Neck: supple no lymphadenopathy Cardiovascular: heart regular rate and rhythm, no murmur Lungs: clear to auscultation bilaterally Abdomen: soft, nontender, nondistended, no obvious ascites, no peritoneal signs, normal bowel sounds, no organomegaly Rectal: Omitted Extremities: no clubbing, cyanosis, or lower extremity edema bilaterally Skin: no lesions on visible extremities Neuro: No focal deficits.  Cranial nerves intact  ASSESSMENT:  1.  Diarrhea predominant irritable bowel syndrome.  Principal diagnosis, in my mind, regarding her bowel habit issues. 2.  Left-sided Crohn's colitis. 3.  Large paraesophageal hernia.  Symptomatic 4.  GERD.  Some breakthrough symptoms currently 5.  Query pancreatic insufficiency 6.  Chronic nausea 7.  Chronic abdominal pain   PLAN:  1.  Discussion on irritable bowel syndrome 2.  Again declined prescribing her antibiotics for on-demand use for her diarrhea 3.  Discussed titrating Creon back to 2 4.  May take antiacid at night for breakthrough heartburn 5.  Schedule upper GI series to evaluate paraesophageal hernia 6.  Surgical referral thereafter for an opinion regarding paraesophageal hernia repair.  She is now open to that idea she has had worsening problems with postprandial pain and dysphagia 7.  Ongoing general care with PCP 8.  Routine GI follow-up 1 year.  Sooner if needed Total of 30 minutes was spent preparing to see the patient, reviewing test, obtaining the history, performing a medically appropriate physical exam, counseling her regarding her myriad of diagnoses and answering numerous questions seemingly unsatisfactorily, ordering and arranging the upper GI series, and documenting clinical information health record.

## 2019-02-02 ENCOUNTER — Ambulatory Visit (HOSPITAL_COMMUNITY)
Admission: RE | Admit: 2019-02-02 | Discharge: 2019-02-02 | Disposition: A | Discharge status: Home / Self Care | Payer: Medicare Other | Source: Ambulatory Visit | Attending: Internal Medicine | Admitting: Internal Medicine

## 2019-02-02 ENCOUNTER — Other Ambulatory Visit: Payer: Self-pay

## 2019-02-02 DIAGNOSIS — K449 Diaphragmatic hernia without obstruction or gangrene: Secondary | ICD-10-CM

## 2019-02-09 ENCOUNTER — Telehealth: Payer: Self-pay | Admitting: Internal Medicine

## 2019-02-09 NOTE — Telephone Encounter (Signed)
Orthopedic dr is faxing a release to be cleared for surgery.  Patient states her blood pressure has been fluctuating between normal and high.  Please give patient a call.  Patient states she will come in for a visit if need be.

## 2019-02-09 NOTE — Telephone Encounter (Signed)
Placed in Dr Ledell Noss folder

## 2019-02-12 NOTE — H&P (Signed)
TOTAL KNEE ADMISSION H&P  Patient is being admitted for left total knee arthroplasty.  Subjective:  Chief Complaint:    Left knee primary OA / pain  HPI: [name], 74 y.o. female, has a history of pain and functional disability in the left knee due to arthritis and has failed non-surgical conservative treatments for greater than 12 weeks to include NSAID's and/or analgesics, corticosteriod injections, viscosupplementation injections and activity modification.  Onset of symptoms was gradual, starting >10 years ago with gradually worsening course since that time. The patient noted prior procedures on the knee to include  arthroplasty on the right knee July 28, 2018.  Patient currently rates pain in the left knee(s) at 9 out of 10 with activity. Patient has worsening of pain with activity and weight bearing, pain that interferes with activities of daily living, pain with passive range of motion, crepitus and joint swelling.  Patient has evidence of periarticular osteophytes and joint space narrowing by imaging studies.  There is no active infection.  Risks, benefits and expectations were discussed with the patient.  Risks including but not limited to the risk of anesthesia, blood clots, nerve damage, blood vessel damage, failure of the prosthesis, infection and up to and including death.  Patient understand the risks, benefits and expectations and wishes to proceed with surgery.   PCP: Isaac Bliss, Rayford Halsted, MD  D/C Plans:       Home (obs)  Post-op Meds:       No Rx given   Tranexamic Acid:      To be given - IV   Decadron:      Is to be given  FYI:      Xarelto (Crohn's disease = no ASA)  Norco  DME:   Pt already has equipment   PT:   [place] [place]  [place]    Patient Active Problem List   Diagnosis Date Noted  . Open wound of right knee 09/18/2018  . Overweight (BMI 25.0-29.9) 07/29/2018  . S/P right TKA 07/28/2018   Past Medical  History:  Diagnosis Date  . Allergies   . Bruises easily   . Complication of anesthesia   . Crohn's disease ([place])    GI Dr Beulah Gandy in [place], [place]   . Deviated septum   . GERD (gastroesophageal reflux disease)   . Headache    occ   . Hiatal hernia   . HTN (hypertension)    pcp Dr Margaretann Loveless  in [place], [place]   . Neuropathy    fingers,periodically   . Osteoarthritis   . Pancreatic insufficiency   . PONV (postoperative nausea and vomiting)   . Rheumatoid arthritis ([place])   . Scoliosis   . Squamous cell carcinoma of arm    left   . Whooping cough with pneumonia    2017    Past Surgical History:  Procedure Laterality Date  . CATARACT EXTRACTION, BILATERAL  2018   with IOC implant   . COLONOSCOPY    . DEBRIDEMENT AND CLOSURE WOUND Right 10/01/2018   Procedure: Excision of right knee wound with placement of ACell and Pravena;  Surgeon: Wallace Going, DO;  Location: [facility];  Service: Plastics;  Laterality: Right;  30 min  . JOINT REPLACEMENT    . knee fracture     . NASAL FRACTURE SURGERY     needed b/c she was getting frequent sinus infections   . TOTAL KNEE  ARTHROPLASTY Right 07/28/2018   Procedure: TOTAL KNEE ARTHROPLASTY;  Surgeon: Paralee Cancel, MD;  Location: [facility];  Service: Orthopedics;  Laterality: Right;  . UPPER GASTROINTESTINAL ENDOSCOPY      No current facility-administered medications for this encounter.   Current Outpatient Medications  Medication Sig Dispense Refill Last Dose  . amoxicillin (AMOXIL) 500 MG capsule Take 500 mg by mouth 2 (two) times daily.     . calcium carbonate (OSCAL) 1500 (600 Ca) MG TABS tablet Take 600 mg of elemental calcium by mouth 2 (two) times daily with a meal.     . cetirizine (ZYRTEC) 10 MG tablet Take 10 mg by mouth daily.     . Cholecalciferol (VITAMIN D) 50 MCG (2000 UT) CAPS Take 2,000 Units by mouth daily.     Marland Kitchen dicyclomine (BENTYL) 20 MG tablet Take 1 tablet (20 mg total) by  mouth 4 (four) times daily as needed for spasms. 360 tablet 1   . DULoxetine (CYMBALTA) 60 MG capsule Take 60 mg by mouth 2 (two) times daily.     . lipase/protease/amylase (CREON) 12000 units CPEP capsule Take 2 capsules (24,000 Units total) by mouth 3 (three) times daily with meals. 540 capsule 1   . loperamide (IMODIUM) 2 MG capsule Take 1 capsule (2 mg total) by mouth 3 (three) times daily as needed for diarrhea or loose stools. 90 capsule 2   . losartan (COZAAR) 100 MG tablet Take 1 tablet (100 mg total) by mouth daily. 90 tablet 1   . mesalamine (LIALDA) 1.2 g EC tablet Take 4 tablets (4.8 g total) by mouth daily with breakfast. 360 tablet 3   . Multiple Vitamin (MULTIVITAMIN WITH MINERALS) TABS tablet Take 1 tablet by mouth daily.     Marland Kitchen omeprazole (PRILOSEC) 40 MG capsule Take 1 capsule (40 mg total) by mouth 2 (two) times daily. 180 capsule 3   . ondansetron (ZOFRAN) 4 MG tablet Take 1 tablet (4 mg total) by mouth every 8 (eight) hours as needed for nausea or vomiting. 90 tablet 1   . Pancrelipase, Lip-Prot-Amyl, (CREON) 24000-76000 units CPEP Take 2 capsules with each meal (3 times a day) 180 capsule 3    Allergies  Allergen Reactions  . Adhesive [Tape]   . Aspirin     Intestinal Bleeding  . Ciprofloxacin Nausea And Vomiting  . Codeine Nausea And Vomiting  . Demerol [Meperidine Hcl] Nausea And Vomiting  . Lactose Intolerance (Gi) Other (See Comments)    Pt has Crohn's   . Latex     Redness and skin peeling  . Other     Nuts-itching in throat  Seeds-stomach issues with chrons    . Septra [Sulfamethoxazole-Trimethoprim] Nausea Only    Social History   Tobacco Use  . Smoking status: Former Smoker    Years: 30.00    Types: Cigarettes    Quit date: 1990    Years since quitting: 31.1  . Smokeless tobacco: Never Used  Substance Use Topics  . Alcohol use: Not Currently    Family History  Problem Relation Age of Onset  . Diabetes Father      Review of Systems   Constitutional: Negative.   HENT: Negative.   Eyes: Negative.   Respiratory: Negative.   Cardiovascular: Negative.   Gastrointestinal: Positive for diarrhea and heartburn.  Genitourinary: Negative.   Musculoskeletal: Positive for joint pain.  Skin: Negative.   Neurological: Negative.   Endo/Heme/Allergies: Positive for environmental allergies.  Psychiatric/Behavioral: Negative.  Objective:  Physical Exam  Constitutional: She is oriented to person, place, and time. She appears well-developed.  HENT:  Head: Normocephalic.  Eyes: Pupils are equal, round, and reactive to light.  Neck: No JVD present. No tracheal deviation present. No thyromegaly present.  Cardiovascular: Normal rate, regular rhythm and intact distal pulses.  Respiratory: Effort normal and breath sounds normal. No respiratory distress. She has no wheezes.  GI: Soft. There is no abdominal tenderness. There is no guarding.  Musculoskeletal:     Cervical back: Neck supple.     Left knee: Swelling and bony tenderness present. No deformity, erythema, ecchymosis or lacerations. Decreased range of motion. Tenderness present.  Lymphadenopathy:    She has no cervical adenopathy.  Neurological: She is alert and oriented to person, place, and time.  Skin: Skin is warm and dry.  Psychiatric: She has a normal mood and affect.      Labs:  Estimated body mass index is 26.51 kg/m as calculated from the following:   Height as of 01/29/19: 4' 11.25" (1.505 m).   Weight as of 01/29/19: 60 kg.   Imaging Review Plain radiographs demonstrate severe degenerative joint disease of the left knee. The bone quality appears to be good for age and reported activity level.      Assessment/Plan:  End stage arthritis, left knee   The patient history, physical examination, clinical judgment of the provider and imaging studies are consistent with end stage degenerative joint disease of the left knee(s) and total knee  arthroplasty is deemed medically necessary. The treatment options including medical management, injection therapy arthroscopy and arthroplasty were discussed at length. The risks and benefits of total knee arthroplasty were presented and reviewed. The risks due to aseptic loosening, infection, stiffness, patella tracking problems, thromboembolic complications and other imponderables were discussed. The patient acknowledged the explanation, agreed to proceed with the plan and consent was signed. Patient is being admitted for treatment for surgery, pain control, PT, OT, prophylactic antibiotics, VTE prophylaxis, progressive ambulation and ADL's and discharge planning. The patient is planning to be discharged home.     Patient's anticipated LOS is less than 2 midnights, meeting these requirements: - Lives within 1 hour of care - Has a competent adult at home to recover with post-op recover - NO history of  - Chronic pain requiring opiods  - Diabetes  - Coronary Artery Disease  - Heart failure  - Heart attack  - Stroke  - DVT/VTE  - Cardiac arrhythmia  - Respiratory Failure/COPD  - Renal failure  - Anemia  - Advanced Liver disease    West Pugh. Daved Mcfann   PA-C  02/12/2019, 11:49 AM

## 2019-02-16 ENCOUNTER — Telehealth: Payer: Self-pay

## 2019-02-16 ENCOUNTER — Encounter: Payer: Self-pay | Admitting: Internal Medicine

## 2019-02-16 ENCOUNTER — Other Ambulatory Visit: Payer: Self-pay

## 2019-02-16 ENCOUNTER — Ambulatory Visit (INDEPENDENT_AMBULATORY_CARE_PROVIDER_SITE_OTHER): Payer: Medicare Other | Admitting: Internal Medicine

## 2019-02-16 VITALS — BP 124/88 | HR 85 | Temp 97.2°F | Wt 136.0 lb

## 2019-02-16 DIAGNOSIS — M1712 Unilateral primary osteoarthritis, left knee: Secondary | ICD-10-CM | POA: Diagnosis not present

## 2019-02-16 DIAGNOSIS — Z01818 Encounter for other preprocedural examination: Secondary | ICD-10-CM

## 2019-02-16 NOTE — Telephone Encounter (Signed)
Pt scheduled to see Dr. Lucia Gaskins with [facility] 04/02/19@9 :45am.

## 2019-02-16 NOTE — Progress Notes (Signed)
Established Patient Office Visit     This visit occurred during the SARS-CoV-2 public health emergency.  Safety protocols were in place, including screening questions prior to the visit, additional usage of staff PPE, and extensive cleaning of exam room while observing appropriate contact time as indicated for disinfecting solutions.    CC/Reason for Visit: Preop clearance  HPI: [name] is a 74 y.o. female who is coming in today for the above mentioned reasons. Past Medical History is significant for: bilateral knee osteoarthritis status post recent right TKR, Crohn's disease diagnosed in 1997, a large hiatal hernia, hypertension that has not been well controlled, pancreatic insufficiency for which she uses Creon with meals, significant scoliosis.  She is now having her left knee replaced on Feb 23rd. Needs pre op form filled out. She has no complaints. Is only limited in activity due to knee pain not because of SOB or CP.   Past Medical/Surgical History: Past Medical History:  Diagnosis Date  . Allergies   . Bruises easily   . Complication of anesthesia   . Crohn's disease ([place])    GI Dr Beulah Gandy in [place], [place]   . Deviated septum   . GERD (gastroesophageal reflux disease)   . Headache    occ   . Hiatal hernia   . HTN (hypertension)    pcp Dr Margaretann Loveless  in [place], [place]   . Neuropathy    fingers,periodically   . Osteoarthritis   . Pancreatic insufficiency   . PONV (postoperative nausea and vomiting)   . Rheumatoid arthritis ([place])   . Scoliosis   . Squamous cell carcinoma of arm    left   . Whooping cough with pneumonia    2017    Past Surgical History:  Procedure Laterality Date  . CATARACT EXTRACTION, BILATERAL  2018   with IOC implant   . COLONOSCOPY    . DEBRIDEMENT AND CLOSURE WOUND Right 10/01/2018   Procedure: Excision of right knee wound with placement of ACell and Pravena;  Surgeon: Wallace Going, DO;  Location:  [facility];  Service: Plastics;  Laterality: Right;  30 min  . JOINT REPLACEMENT    . knee fracture     . NASAL FRACTURE SURGERY     needed b/c she was getting frequent sinus infections   . 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 UPPER GASTROINTESTINAL ENDOSCOPY      Social History:  reports that she quit smoking about 31 years ago. Her smoking use included cigarettes. She quit after 30.00 years of use. She has never used smokeless tobacco. She reports previous alcohol use. She reports that she does not use drugs.  Allergies: Allergies  Allergen Reactions  . Adhesive [Tape]     Redness and skin peeling  . Aspirin     Intestinal Bleeding  . Ciprofloxacin Nausea And Vomiting  . Codeine Nausea And Vomiting  . Demerol [Meperidine Hcl] Nausea And Vomiting  . Lactose Intolerance (Gi) Other (See Comments)    Pt has Crohn's   . Latex     Redness and skin peeling  . Other     Nuts-itching in throat  Seeds-stomach issues with chrons    . Septra [Sulfamethoxazole-Trimethoprim] Nausea Only    Family History:  Family History  Problem Relation Age of Onset  . Diabetes Father  Current Outpatient Medications:  .  acetaminophen (TYLENOL) 500 MG tablet, Take 1,000 mg by mouth every 6 (six) hours as needed for moderate pain., Disp: , Rfl:  .  amoxicillin (AMOXIL) 500 MG capsule, Take 500 mg by mouth 2 (two) times daily as needed (crohn's flare up). , Disp: , Rfl:  .  Calcium Citrate-Vitamin D (CALCIUM + D PO), Take 1 tablet by mouth 2 (two) times daily., Disp: , Rfl:  .  cetirizine (ZYRTEC) 10 MG tablet, Take 10 mg by mouth daily., Disp: , Rfl:  .  dicyclomine (BENTYL) 20 MG tablet, Take 1 tablet (20 mg total) by mouth 4 (four) times daily as needed for spasms. (Patient taking differently: Take 20 mg by mouth See admin instructions. Take 20  mg by mouth twice daily, may take an additional 2 doses of 20 mg throughout the day as needed for cramps), Disp: 360 tablet, Rfl: 1 .  DULoxetine (CYMBALTA) 60 MG capsule, Take 60 mg by mouth 2 (two) times daily., Disp: , Rfl:  .  lipase/protease/amylase (CREON) 12000 units CPEP capsule, Take 2 capsules (24,000 Units total) by mouth 3 (three) times daily with meals., Disp: 540 capsule, Rfl: 1 .  loperamide (IMODIUM) 2 MG capsule, Take 1 capsule (2 mg total) by mouth 3 (three) times daily as needed for diarrhea or loose stools., Disp: 90 capsule, Rfl: 2 .  losartan (COZAAR) 100 MG tablet, Take 1 tablet (100 mg total) by mouth daily., Disp: 90 tablet, Rfl: 1 .  mesalamine (LIALDA) 1.2 g EC tablet, Take 4 tablets (4.8 g total) by mouth daily with breakfast., Disp: 360 tablet, Rfl: 3 .  Misc Natural Product Nasal (NASAL CLEANSE RINSE MIX NA), Place 1 Dose into the nose daily as needed (congestion)., Disp: , Rfl:  .  Multiple Vitamin (MULTIVITAMIN WITH MINERALS) TABS tablet, Take 1 tablet by mouth daily., Disp: , Rfl:  .  omeprazole (PRILOSEC) 40 MG capsule, Take 1 capsule (40 mg total) by mouth 2 (two) times daily., Disp: 180 capsule, Rfl: 3 .  ondansetron (ZOFRAN) 4 MG tablet, Take 1 tablet (4 mg total) by mouth every 8 (eight) hours as needed for nausea or vomiting., Disp: 90 tablet, Rfl: 1 .  Pancrelipase, Lip-Prot-Amyl, (CREON) 24000-76000 units CPEP, Take 2 capsules with each meal (3 times a day) (Patient taking differently: Take 2 capsules by mouth 3 (three) times daily with meals. ), Disp: 180 capsule, Rfl: 3  Review of Systems:  Constitutional: Denies fever, chills, diaphoresis, appetite change and fatigue.  HEENT: Denies photophobia, eye pain, redness, hearing loss, ear pain, congestion, sore throat, rhinorrhea, sneezing, mouth sores, trouble swallowing, neck pain, neck stiffness and tinnitus.   Respiratory: Denies SOB, DOE, cough, chest tightness,  and wheezing.   Cardiovascular: Denies chest  pain, palpitations and leg swelling.  Gastrointestinal: Denies nausea, vomiting, abdominal pain, diarrhea, constipation, blood in stool and abdominal distention.  Genitourinary: Denies dysuria, urgency, frequency, hematuria, flank pain and difficulty urinating.  Endocrine: Denies: hot or cold intolerance, sweats, changes in hair or nails, polyuria, polydipsia. Musculoskeletal: Denies myalgias, back pain, joint swelling, arthralgias and gait problem.  Skin: Denies pallor, rash and wound.  Neurological: Denies dizziness, seizures, syncope, weakness, light-headedness, numbness and headaches.  Hematological: Denies adenopathy. Easy bruising, personal or family bleeding history  Psychiatric/Behavioral: Denies suicidal ideation, mood changes, confusion, nervousness, sleep disturbance and agitation    Physical Exam: Vitals:   02/16/19 1459  BP: 124/88  Pulse: 85  Temp: (!) 97.2 F (36.2 C)  TempSrc: Temporal  Weight: 136 lb (61.7 kg)    Body mass index is 27.24 kg/m.   Constitutional: NAD, calm, comfortable Eyes: PERRL, lids and conjunctivae normal, wears corrective lenses. ENMT: Mucous membranes are moist.  Respiratory: clear to auscultation bilaterally, no wheezing, no crackles. Normal respiratory effort. No accessory muscle use.  Cardiovascular: Regular rate and rhythm, no murmurs / rubs / gallops. No extremity edema. 2+ pedal pulses.   Neurologic: grossly intact and nonfocal. Psychiatric: Normal judgment and insight. Alert and oriented x 3. Normal mood.    Impression and Plan:  Preop examination  - Plan: EKG 12-Lead, CBC with Differential/Platelet, Comprehensive metabolic panel, Hemoglobin A1c, Albumin, APTT, Protime-INR, Urinalysis with Reflex Microscopic, Urinalysis with Reflex Microscopic, Protime-INR, APTT, Albumin, Hemoglobin A1c, Comprehensive metabolic panel, CBC with Differential/Platelet  Osteoarthritis of left knee, unspecified osteoarthritis type  Labs ordered as  requested by ortho. Class 1 risk: 3.9% risk of 30 day death, MI, cardiac arrest. OK to proceed without further work up. EKG is ok, without acute ischemic changes. Form filled and faxed to ortho office.     Lelon Frohlich, MD [facility]

## 2019-02-17 LAB — CBC WITH DIFFERENTIAL/PLATELET
Basophils Absolute: 0.1 10*3/uL (ref 0.0–0.1)
Basophils Relative: 0.7 % (ref 0.0–3.0)
Eosinophils Absolute: 0.2 10*3/uL (ref 0.0–0.7)
Eosinophils Relative: 2.7 % (ref 0.0–5.0)
HCT: 39.5 % (ref 36.0–46.0)
Hemoglobin: 13.3 g/dL (ref 12.0–15.0)
Lymphocytes Relative: 18.9 % (ref 12.0–46.0)
Lymphs Abs: 1.7 10*3/uL (ref 0.7–4.0)
MCHC: 33.6 g/dL (ref 30.0–36.0)
MCV: 91.6 fl (ref 78.0–100.0)
Monocytes Absolute: 0.5 10*3/uL (ref 0.1–1.0)
Monocytes Relative: 5.6 % (ref 3.0–12.0)
Neutro Abs: 6.3 10*3/uL (ref 1.4–7.7)
Neutrophils Relative %: 72.1 % (ref 43.0–77.0)
Platelets: 349 10*3/uL (ref 150.0–400.0)
RBC: 4.32 Mil/uL (ref 3.87–5.11)
RDW: 14.4 % (ref 11.5–15.5)
WBC: 8.8 10*3/uL (ref 4.0–10.5)

## 2019-02-17 LAB — URINALYSIS, ROUTINE W REFLEX MICROSCOPIC
Bilirubin Urine: NEGATIVE
Hgb urine dipstick: NEGATIVE
Ketones, ur: NEGATIVE
Leukocytes,Ua: NEGATIVE
Nitrite: NEGATIVE
RBC / HPF: NONE SEEN (ref 0–?)
Specific Gravity, Urine: 1.02 (ref 1.000–1.030)
Total Protein, Urine: NEGATIVE
Urine Glucose: NEGATIVE
Urobilinogen, UA: 0.2 (ref 0.0–1.0)
WBC, UA: NONE SEEN (ref 0–?)
pH: 5.5 (ref 5.0–8.0)

## 2019-02-17 LAB — COMPREHENSIVE METABOLIC PANEL
ALT: 14 U/L (ref 0–35)
AST: 17 U/L (ref 0–37)
Albumin: 4.3 g/dL (ref 3.5–5.2)
Alkaline Phosphatase: 97 U/L (ref 39–117)
BUN: 26 mg/dL — ABNORMAL HIGH (ref 6–23)
CO2: 29 mEq/L (ref 19–32)
Calcium: 9.8 mg/dL (ref 8.4–10.5)
Chloride: 102 mEq/L (ref 96–112)
Creatinine, Ser: 1.03 mg/dL (ref 0.40–1.20)
GFR: 52.43 mL/min — ABNORMAL LOW (ref >60.00)
Glucose, Bld: 88 mg/dL (ref 70–99)
Potassium: 4.5 mEq/L (ref 3.5–5.1)
Sodium: 137 mEq/L (ref 135–145)
Total Bilirubin: 0.5 mg/dL (ref 0.2–1.2)
Total Protein: 7.5 g/dL (ref 6.0–8.3)

## 2019-02-17 LAB — PROTIME-INR
INR: 1 ratio (ref 0.8–1.0)
Prothrombin Time: 11.2 s (ref 9.6–13.1)

## 2019-02-17 LAB — APTT: aPTT: 26.3 s (ref 23.4–32.7)

## 2019-02-17 LAB — HEMOGLOBIN A1C: Hgb A1c MFr Bld: 5.8 % (ref 4.6–6.5)

## 2019-02-17 LAB — ALBUMIN: Albumin: 4.3 g/dL (ref 3.5–5.2)

## 2019-02-18 NOTE — Patient Instructions (Addendum)
DUE TO COVID-19 ONLY ONE VISITOR IS ALLOWED TO COME WITH YOU AND STAY IN THE WAITING ROOM ONLY DURING PRE OP AND PROCEDURE DAY OF SURGERY. THE 1 VISITOR MAY VISIT WITH YOU AFTER SURGERY IN YOUR PRIVATE ROOM DURING VISITING HOURS ONLY!  YOU NEED TO HAVE A COVID 19 TEST ON_Friday 02/19/2021______ @___1000  am____, THIS TEST MUST BE DONE BEFORE SURGERY, COME  [address], [place] , [id number].  ([place]) ONCE YOUR COVID TEST IS COMPLETED, PLEASE BEGIN THE QUARANTINE INSTRUCTIONS AS OUTLINED IN YOUR HANDOUT.                [name]     Your procedure is scheduled on: Tuesday 03/02/2019   Report to [facility] Main  Entrance    Report to admitting at  0735  AM     Call this number if you have problems the morning of surgery [phone]    Remember: Do not eat food  :After Midnight.    NO SOLID FOOD AFTER MIDNIGHT THE NIGHT PRIOR TO SURGERY AND NOTHING BY MOUTH EXCEPT CLEAR LIQUIDS UNTIL  0705 am .     PLEASE FINISH ENSURE DRINK PER SURGEON ORDER  WHICH NEEDS TO BE COMPLETED AT  0705 am .   CLEAR LIQUID DIET   Foods Allowed                                                                     Foods Excluded  Coffee and tea, regular and decaf                             liquids that you cannot  Plain Jell-O any favor except red or purple                                           see through such as: Fruit ices (not with fruit pulp)                                     milk, soups, orange juice  Iced Popsicles                                    All solid food Carbonated beverages, regular and diet                                    Cranberry, grape and apple juices Sports drinks like Gatorade Lightly seasoned clear broth or consume(fat free) Sugar, honey syrup  Sample Menu Breakfast                                Lunch  Supper Cranberry juice                    Beef broth                            Chicken  broth Jell-O                                     Grape juice                           Apple juice Coffee or tea                        Jell-O                                      Popsicle                                                Coffee or tea                        Coffee or tea  _____________________________________________________________________     BRUSH YOUR TEETH MORNING OF SURGERY AND RINSE YOUR MOUTH OUT, NO CHEWING GUM CANDY OR MINTS.     Take these medicines the morning of surgery with A SIP OF WATER: Duloxetine (Cymbalta), Cetirizine (Zyrtec), Omeprazole (Prilosec)                                 You may not have any metal on your body including hair pins and              piercings  Do not wear jewelry, make-up, lotions, powders or perfumes, deodorant             Do not wear nail polish on your fingernails.  Do not shave  48 hours prior to surgery.                 Do not bring valuables to the hospital. [place].  Contacts, dentures or bridgework may not be worn into surgery.  Leave suitcase in the car. After surgery it may be brought to your room.     Patients discharged the day of surgery will not be allowed to drive home. IF YOU ARE HAVING SURGERY AND GOING HOME THE SAME DAY, YOU MUST HAVE AN ADULT TO DRIVE YOU HOME AND  BE WITH YOU FOR 24 HOURS. YOU MAY GO HOME BY TAXI OR UBER OR ORTHERWISE, BUT AN ADULT MUST ACCOMPANY YOU HOME AND STAY WITH YOU FOR 24 HOURS.  Name and phone number of your driver:spouseShanon [name]   UUVO-[phone]                Please read over the following fact sheets you were given: _____________________________________________________________________             [facility] - Preparing  for Surgery Before surgery, you can play an important role.  Because skin is not sterile, your skin needs to be as free of germs as possible.  You can reduce the number of germs on your skin by washing  with CHG (chlorahexidine gluconate) soap before surgery.  CHG is an antiseptic cleaner which kills germs and bonds with the skin to continue killing germs even after washing. Please DO NOT use if you have an allergy to CHG or antibacterial soaps.  If your skin becomes reddened/irritated stop using the CHG and inform your nurse when you arrive at Short Stay. Do not shave (including legs and underarms) for at least 48 hours prior to the first CHG shower.  You may shave your face/neck. Please follow these instructions carefully:  1.  Shower with CHG Soap the night before surgery and the  morning of Surgery.  2.  If you choose to wash your hair, wash your hair first as usual with your  normal  shampoo.  3.  After you shampoo, rinse your hair and body thoroughly to remove the  shampoo.                           4.  Use CHG as you would any other liquid soap.  You can apply chg directly  to the skin and wash                       Gently with a scrungie or clean washcloth.  5.  Apply the CHG Soap to your body ONLY FROM THE NECK DOWN.   Do not use on face/ open                           Wound or open sores. Avoid contact with eyes, ears mouth and genitals (private parts).                       Wash face,  Genitals (private parts) with your normal soap.             6.  Wash thoroughly, paying special attention to the area where your surgery  will be performed.  7.  Thoroughly rinse your body with warm water from the neck down.  8.  DO NOT shower/wash with your normal soap after using and rinsing off  the CHG Soap.                9.  Pat yourself dry with a clean towel.            10.  Wear clean pajamas.            11.  Place clean sheets on your bed the night of your first shower and do not  sleep with pets. Day of Surgery : Do not apply any lotions/deodorants the morning of surgery.  Please wear clean clothes to the hospital/surgery center.  FAILURE TO FOLLOW THESE INSTRUCTIONS MAY RESULT IN THE  CANCELLATION OF YOUR SURGERY PATIENT SIGNATURE_________________________________  NURSE SIGNATURE__________________________________  ________________________________________________________________________   [name]  An incentive spirometer is a tool that can help keep your lungs clear and active. This tool measures how well you are filling your lungs with each breath. Taking long deep breaths may help reverse or decrease the chance of developing breathing (pulmonary) problems (especially infection) following:  A long period of time when you are  unable to move or be active. BEFORE THE PROCEDURE   If the spirometer includes an indicator to show your best effort, your nurse or respiratory therapist will set it to a desired goal.  If possible, sit up straight or lean slightly forward. Try not to slouch.  Hold the incentive spirometer in an upright position. INSTRUCTIONS FOR USE  1. Sit on the edge of your bed if possible, or sit up as far as you can in bed or on a chair. 2. Hold the incentive spirometer in an upright position. 3. Breathe out normally. 4. Place the mouthpiece in your mouth and seal your lips tightly around it. 5. Breathe in slowly and as deeply as possible, raising the piston or the ball toward the top of the column. 6. Hold your breath for 3-5 seconds or for as long as possible. Allow the piston or ball to fall to the bottom of the column. 7. Remove the mouthpiece from your mouth and breathe out normally. 8. Rest for a few seconds and repeat Steps 1 through 7 at least 10 times every 1-2 hours when you are awake. Take your time and take a few normal breaths between deep breaths. 9. The spirometer may include an indicator to show your best effort. Use the indicator as a goal to work toward during each repetition. 10. After each set of 10 deep breaths, practice coughing to be sure your lungs are clear. If you have an incision (the cut made at the time of surgery),  support your incision when coughing by placing a pillow or rolled up towels firmly against it. Once you are able to get out of bed, walk around indoors and cough well. You may stop using the incentive spirometer when instructed by your caregiver.  RISKS AND COMPLICATIONS  Take your time so you do not get dizzy or light-headed.  If you are in pain, you may need to take or ask for pain medication before doing incentive spirometry. It is harder to take a deep breath if you are having pain. AFTER USE  Rest and breathe slowly and easily.  It can be helpful to keep track of a log of your progress. Your caregiver can provide you with a simple table to help with this. If you are using the spirometer at home, follow these instructions: [place] IF:   You are having difficultly using the spirometer.  You have trouble using the spirometer as often as instructed.  Your pain medication is not giving enough relief while using the spirometer.  You develop fever of 100.5 F (38.1 C) or higher. SEEK IMMEDIATE MEDICAL CARE IF:   You cough up bloody sputum that had not been present before.  You develop fever of 102 F (38.9 C) or greater.  You develop worsening pain at or near the incision site. MAKE SURE YOU:   Understand these instructions.  Will watch your condition.  Will get help right away if you are not doing well or get worse. Document Released: 05/06/2006 Document Revised: 03/18/2011 Document Reviewed: 07/07/2006 [employer] Patient Information 2014 Exit[employer], [place].   ________________________________________________________________________  WHAT IS A BLOOD TRANSFUSION? Blood Transfusion Information  A transfusion is the replacement of blood or some of its parts. Blood is made up of multiple cells which provide different functions.  Red blood cells carry oxygen and are used for blood loss replacement.  White blood cells fight against infection.  Platelets control  bleeding.  Plasma helps clot blood.  Other blood products  are available for specialized needs, such as hemophilia or other clotting disorders. BEFORE THE TRANSFUSION  Who gives blood for transfusions?   Healthy volunteers who are fully evaluated to make sure their blood is safe. This is blood bank blood. Transfusion therapy is the safest it has ever been in the practice of medicine. Before blood is taken from a donor, a complete history is taken to make sure that person has no history of diseases nor engages in risky social behavior (examples are intravenous drug use or sexual activity with multiple partners). The donor's travel history is screened to minimize risk of transmitting infections, such as malaria. The donated blood is tested for signs of infectious diseases, such as HIV and hepatitis. The blood is then tested to be sure it is compatible with you in order to minimize the chance of a transfusion reaction. If you or a relative donates blood, this is often done in anticipation of surgery and is not appropriate for emergency situations. It takes many days to process the donated blood. RISKS AND COMPLICATIONS Although transfusion therapy is very safe and saves many lives, the main dangers of transfusion include:   Getting an infectious disease.  Developing a transfusion reaction. This is an allergic reaction to something in the blood you were given. Every precaution is taken to prevent this. The decision to have a blood transfusion has been considered carefully by your caregiver before blood is given. Blood is not given unless the benefits outweigh the risks. AFTER THE TRANSFUSION  Right after receiving a blood transfusion, you will usually feel much better and more energetic. This is especially true if your red blood cells have gotten low (anemic). The transfusion raises the level of the red blood cells which carry oxygen, and this usually causes an energy increase.  The nurse  administering the transfusion will monitor you carefully for complications. HOME CARE INSTRUCTIONS  No special instructions are needed after a transfusion. You may find your energy is better. Speak with your caregiver about any limitations on activity for underlying diseases you may have. SEEK MEDICAL CARE IF:   Your condition is not improving after your transfusion.  You develop redness or irritation at the intravenous (IV) site. SEEK IMMEDIATE MEDICAL CARE IF:  Any of the following symptoms occur over the next 12 hours:  Shaking chills.  You have a temperature by mouth above 102 F (38.9 C), not controlled by medicine.  Chest, back, or muscle pain.  People around you feel you are not acting correctly or are confused.  Shortness of breath or difficulty breathing.  Dizziness and fainting.  You get a rash or develop hives.  You have a decrease in urine output.  Your urine turns a dark color or changes to pink, red, or brown. Any of the following symptoms occur over the next 10 days:  You have a temperature by mouth above 102 F (38.9 C), not controlled by medicine.  Shortness of breath.  Weakness after normal activity.  The white part of the eye turns yellow (jaundice).  You have a decrease in the amount of urine or are urinating less often.  Your urine turns a dark color or changes to pink, red, or brown. Document Released: 12/22/1999 Document Revised: 03/18/2011 Document Reviewed: 08/10/2007 [facility] Patient Information 2014 [place], [place].  _______________________________________________________________________

## 2019-02-19 ENCOUNTER — Encounter (HOSPITAL_COMMUNITY): Payer: Self-pay

## 2019-02-19 ENCOUNTER — Encounter (HOSPITAL_COMMUNITY)
Admission: RE | Admit: 2019-02-19 | Discharge: 2019-02-19 | Disposition: A | Discharge status: Home / Self Care | Payer: Medicare Other | Source: Ambulatory Visit | Attending: Orthopedic Surgery | Admitting: Orthopedic Surgery

## 2019-02-19 ENCOUNTER — Other Ambulatory Visit: Payer: Self-pay

## 2019-02-19 DIAGNOSIS — Z87891 Personal history of nicotine dependence: Secondary | ICD-10-CM | POA: Insufficient documentation

## 2019-02-19 DIAGNOSIS — Z01812 Encounter for preprocedural laboratory examination: Secondary | ICD-10-CM | POA: Diagnosis not present

## 2019-02-19 DIAGNOSIS — M1712 Unilateral primary osteoarthritis, left knee: Secondary | ICD-10-CM | POA: Diagnosis not present

## 2019-02-19 DIAGNOSIS — Z79899 Other long term (current) drug therapy: Secondary | ICD-10-CM | POA: Diagnosis not present

## 2019-02-19 DIAGNOSIS — I10 Essential (primary) hypertension: Secondary | ICD-10-CM | POA: Diagnosis not present

## 2019-02-19 LAB — CBC
HCT: 40.8 % (ref 36.0–46.0)
Hemoglobin: 13.4 g/dL (ref 12.0–15.0)
MCH: 30.2 pg (ref 26.0–34.0)
MCHC: 32.8 g/dL (ref 30.0–36.0)
MCV: 92.1 fL (ref 80.0–100.0)
Platelets: 337 10*3/uL (ref 150–400)
RBC: 4.43 MIL/uL (ref 3.87–5.11)
RDW: 14.6 % (ref 11.5–15.5)
WBC: 7.6 10*3/uL (ref 4.0–10.5)
nRBC: 0 % (ref 0.0–0.2)

## 2019-02-19 LAB — TYPE AND SCREEN
ABO/RH(D): A POS
Antibody Screen: NEGATIVE

## 2019-02-19 LAB — BASIC METABOLIC PANEL
Anion gap: 7 (ref 5–15)
BUN: 30 mg/dL — ABNORMAL HIGH (ref 8–23)
CO2: 25 mmol/L (ref 22–32)
Calcium: 9.1 mg/dL (ref 8.9–10.3)
Chloride: 108 mmol/L (ref 98–111)
Creatinine, Ser: 0.93 mg/dL (ref 0.44–1.00)
GFR calc Af Amer: >60 mL/min (ref >60)
GFR calc non Af Amer: >60 mL/min (ref >60)
Glucose, Bld: 109 mg/dL — ABNORMAL HIGH (ref 70–99)
Potassium: 5.1 mmol/L (ref 3.5–5.1)
Sodium: 140 mmol/L (ref 135–145)

## 2019-02-19 LAB — SURGICAL PCR SCREEN
MRSA, PCR: NEGATIVE
Staphylococcus aureus: NEGATIVE

## 2019-02-19 NOTE — Progress Notes (Signed)
PCP - Dr. Rande Lawman  [place] 02/16/2019 epic Labs-02/16/2019-CBC/diff.,CMP,PT/PTT,UA,HgA1C epic Cardiologist - over 5 years ago in [place] , Needed a test done -negative results   Chest x-ray - 12/18/2018 epic EKG - 02/16/2019 epic Stress Test -n/a  ECHO - n/a Cardiac Cath - n/a  Sleep Study - n/a CPAP - n/a  Fasting Blood Sugar - n/a Checks Blood Sugar __0___ times a day  Blood Thinner Instructions:n/a Aspirin Instructions:n/a Last Dose:n/a  Anesthesia review:   Patient has a history of Chron's disease, HTN, neuropathy, Rheumatoid arthritis, and emphysema.  Patient denies shortness of breath, fever, cough and chest pain at PAT appointment   Patient verbalized understanding of instructions that were given to them at the PAT appointment. Patient was also instructed that they will need to review over the PAT instructions again at home before surgery.

## 2019-02-26 ENCOUNTER — Other Ambulatory Visit (HOSPITAL_COMMUNITY)
Admission: RE | Admit: 2019-02-26 | Discharge: 2019-02-26 | Disposition: A | Discharge status: Home / Self Care | Payer: Medicare Other | Source: Ambulatory Visit | Attending: Orthopedic Surgery | Admitting: Orthopedic Surgery

## 2019-02-26 ENCOUNTER — Other Ambulatory Visit: Payer: Self-pay

## 2019-02-26 DIAGNOSIS — Z20822 Contact with and (suspected) exposure to covid-19: Secondary | ICD-10-CM | POA: Insufficient documentation

## 2019-02-26 DIAGNOSIS — Z01812 Encounter for preprocedural laboratory examination: Secondary | ICD-10-CM | POA: Diagnosis present

## 2019-02-26 LAB — SARS CORONAVIRUS 2 (TAT 6-24 HRS): SARS Coronavirus 2: NEGATIVE

## 2019-03-02 ENCOUNTER — Ambulatory Visit (HOSPITAL_COMMUNITY): Payer: Medicare Other | Admitting: Physician Assistant

## 2019-03-02 ENCOUNTER — Other Ambulatory Visit: Payer: Self-pay

## 2019-03-02 ENCOUNTER — Inpatient Hospital Stay (HOSPITAL_COMMUNITY)
Admission: RE | Admit: 2019-03-02 | Discharge: 2019-03-04 | DRG: 470 | Disposition: A | Discharge status: Home / Self Care | Payer: Medicare Other | Source: Other Acute Inpatient Hospital | Attending: Orthopedic Surgery | Admitting: Orthopedic Surgery

## 2019-03-02 ENCOUNTER — Telehealth (HOSPITAL_COMMUNITY): Payer: Self-pay | Admitting: *Deleted

## 2019-03-02 ENCOUNTER — Encounter (HOSPITAL_COMMUNITY)
Admission: RE | Disposition: A | Discharge status: Home / Self Care | Payer: Self-pay | Source: Other Acute Inpatient Hospital | Attending: Orthopedic Surgery

## 2019-03-02 ENCOUNTER — Ambulatory Visit (HOSPITAL_COMMUNITY): Payer: Medicare Other | Admitting: Certified Registered Nurse Anesthetist

## 2019-03-02 ENCOUNTER — Encounter (HOSPITAL_COMMUNITY): Payer: Self-pay | Admitting: Orthopedic Surgery

## 2019-03-02 DIAGNOSIS — Z881 Allergy status to other antibiotic agents status: Secondary | ICD-10-CM

## 2019-03-02 DIAGNOSIS — Z9842 Cataract extraction status, left eye: Secondary | ICD-10-CM

## 2019-03-02 DIAGNOSIS — Z91040 Latex allergy status: Secondary | ICD-10-CM

## 2019-03-02 DIAGNOSIS — G629 Polyneuropathy, unspecified: Secondary | ICD-10-CM | POA: Diagnosis present

## 2019-03-02 DIAGNOSIS — M419 Scoliosis, unspecified: Secondary | ICD-10-CM | POA: Diagnosis present

## 2019-03-02 DIAGNOSIS — M659 Synovitis and tenosynovitis, unspecified: Secondary | ICD-10-CM | POA: Diagnosis present

## 2019-03-02 DIAGNOSIS — Z87891 Personal history of nicotine dependence: Secondary | ICD-10-CM

## 2019-03-02 DIAGNOSIS — Z79899 Other long term (current) drug therapy: Secondary | ICD-10-CM

## 2019-03-02 DIAGNOSIS — Z91018 Allergy to other foods: Secondary | ICD-10-CM

## 2019-03-02 DIAGNOSIS — M25762 Osteophyte, left knee: Secondary | ICD-10-CM | POA: Diagnosis present

## 2019-03-02 DIAGNOSIS — Z885 Allergy status to narcotic agent status: Secondary | ICD-10-CM

## 2019-03-02 DIAGNOSIS — Z9841 Cataract extraction status, right eye: Secondary | ICD-10-CM

## 2019-03-02 DIAGNOSIS — Z961 Presence of intraocular lens: Secondary | ICD-10-CM | POA: Diagnosis present

## 2019-03-02 DIAGNOSIS — Z6825 Body mass index (BMI) 25.0-25.9, adult: Secondary | ICD-10-CM

## 2019-03-02 DIAGNOSIS — Z85828 Personal history of other malignant neoplasm of skin: Secondary | ICD-10-CM

## 2019-03-02 DIAGNOSIS — K5090 Crohn's disease, unspecified, without complications: Secondary | ICD-10-CM | POA: Diagnosis present

## 2019-03-02 DIAGNOSIS — Z96652 Presence of left artificial knee joint: Secondary | ICD-10-CM

## 2019-03-02 DIAGNOSIS — E663 Overweight: Secondary | ICD-10-CM | POA: Diagnosis present

## 2019-03-02 DIAGNOSIS — M1712 Unilateral primary osteoarthritis, left knee: Principal | ICD-10-CM | POA: Diagnosis present

## 2019-03-02 DIAGNOSIS — Z96651 Presence of right artificial knee joint: Secondary | ICD-10-CM | POA: Diagnosis present

## 2019-03-02 DIAGNOSIS — E739 Lactose intolerance, unspecified: Secondary | ICD-10-CM | POA: Diagnosis present

## 2019-03-02 DIAGNOSIS — M1990 Unspecified osteoarthritis, unspecified site: Secondary | ICD-10-CM | POA: Diagnosis present

## 2019-03-02 DIAGNOSIS — Z882 Allergy status to sulfonamides status: Secondary | ICD-10-CM

## 2019-03-02 DIAGNOSIS — Z886 Allergy status to analgesic agent status: Secondary | ICD-10-CM

## 2019-03-02 DIAGNOSIS — K219 Gastro-esophageal reflux disease without esophagitis: Secondary | ICD-10-CM | POA: Diagnosis present

## 2019-03-02 DIAGNOSIS — I10 Essential (primary) hypertension: Secondary | ICD-10-CM | POA: Diagnosis present

## 2019-03-02 DIAGNOSIS — M069 Rheumatoid arthritis, unspecified: Secondary | ICD-10-CM | POA: Diagnosis present

## 2019-03-02 DIAGNOSIS — Z9109 Other allergy status, other than to drugs and biological substances: Secondary | ICD-10-CM

## 2019-03-02 DIAGNOSIS — G8929 Other chronic pain: Secondary | ICD-10-CM | POA: Diagnosis present

## 2019-03-02 HISTORY — PX: TOTAL KNEE ARTHROPLASTY: SHX125

## 2019-03-02 SURGERY — ARTHROPLASTY, KNEE, TOTAL
Anesthesia: Regional | Site: Knee | Laterality: Left

## 2019-03-02 MED ORDER — TRANEXAMIC ACID-NACL 1000-0.7 MG/100ML-% IV SOLN
1000.0000 mg | INTRAVENOUS | Status: AC
  Administered 2019-03-02: 1000 mg via INTRAVENOUS
  Filled 2019-03-02: qty 100

## 2019-03-02 MED ORDER — PHENYLEPHRINE 40 MCG/ML (10ML) SYRINGE FOR IV PUSH (FOR BLOOD PRESSURE SUPPORT)
PREFILLED_SYRINGE | INTRAVENOUS | Status: AC
  Filled 2019-03-02: qty 10

## 2019-03-02 MED ORDER — LABETALOL HCL 5 MG/ML IV SOLN
5.0000 mg | Freq: Once | INTRAVENOUS | Status: AC
  Administered 2019-03-02: 5 mg via INTRAVENOUS

## 2019-03-02 MED ORDER — CHLORHEXIDINE GLUCONATE 4 % EX LIQD: 60.0000 mL | Freq: Once | CUTANEOUS | Status: DC

## 2019-03-02 MED ORDER — SODIUM CHLORIDE (PF) 0.9 % IJ SOLN
INTRAMUSCULAR | Status: AC
  Filled 2019-03-02: qty 50

## 2019-03-02 MED ORDER — PHENYLEPHRINE HCL (PRESSORS) 10 MG/ML IV SOLN
INTRAVENOUS | Status: DC | PRN
  Administered 2019-03-02: 40 ug via INTRAVENOUS

## 2019-03-02 MED ORDER — SCOPOLAMINE 1 MG/3DAYS TD PT72
1.0000 | MEDICATED_PATCH | TRANSDERMAL | Status: DC
  Administered 2019-03-02: 09:00:00 1.5 mg via TRANSDERMAL
  Filled 2019-03-02 (×2): qty 1

## 2019-03-02 MED ORDER — DEXAMETHASONE SODIUM PHOSPHATE 10 MG/ML IJ SOLN: 10.0000 mg | Freq: Once | INTRAMUSCULAR | Status: DC

## 2019-03-02 MED ORDER — TRANEXAMIC ACID-NACL 1000-0.7 MG/100ML-% IV SOLN
1000.0000 mg | Freq: Once | INTRAVENOUS | Status: AC
  Administered 2019-03-02: 1000 mg via INTRAVENOUS
  Filled 2019-03-02: qty 100

## 2019-03-02 MED ORDER — ROPIVACAINE HCL 5 MG/ML IJ SOLN
INTRAMUSCULAR | Status: DC | PRN
  Administered 2019-03-02: 30 mL via PERINEURAL

## 2019-03-02 MED ORDER — MENTHOL 3 MG MT LOZG: 1.0000 | LOZENGE | OROMUCOSAL | Status: DC | PRN

## 2019-03-02 MED ORDER — ONDANSETRON HCL 4 MG/2ML IJ SOLN
INTRAMUSCULAR | Status: DC | PRN
  Administered 2019-03-02: 4 mg via INTRAVENOUS

## 2019-03-02 MED ORDER — LABETALOL HCL 5 MG/ML IV SOLN
INTRAVENOUS | Status: AC
  Filled 2019-03-02: qty 4

## 2019-03-02 MED ORDER — HYDROMORPHONE HCL 2 MG/ML IJ SOLN
INTRAMUSCULAR | Status: AC
  Filled 2019-03-02: qty 1

## 2019-03-02 MED ORDER — ONDANSETRON HCL 4 MG/2ML IJ SOLN
INTRAMUSCULAR | Status: AC
  Filled 2019-03-02: qty 2

## 2019-03-02 MED ORDER — BUPIVACAINE HCL 0.25 % IJ SOLN
INTRAMUSCULAR | Status: AC
  Filled 2019-03-02: qty 1

## 2019-03-02 MED ORDER — ONDANSETRON HCL 4 MG/2ML IJ SOLN: 4.0000 mg | Freq: Four times a day (QID) | INTRAMUSCULAR | Status: DC | PRN

## 2019-03-02 MED ORDER — LORATADINE 10 MG PO TABS
10.0000 mg | ORAL_TABLET | Freq: Every day | ORAL | Status: DC
  Administered 2019-03-03 – 2019-03-04 (×2): 10 mg via ORAL
  Filled 2019-03-02 (×2): qty 1

## 2019-03-02 MED ORDER — HYDROMORPHONE HCL 1 MG/ML IJ SOLN
INTRAMUSCULAR | Status: DC | PRN
  Administered 2019-03-02: .25 mg via INTRAVENOUS
  Administered 2019-03-02: .5 mg via INTRAVENOUS
  Administered 2019-03-02: .25 mg via INTRAVENOUS
  Administered 2019-03-02 (×2): .5 mg via INTRAVENOUS

## 2019-03-02 MED ORDER — DULOXETINE HCL 60 MG PO CPEP
60.0000 mg | ORAL_CAPSULE | Freq: Two times a day (BID) | ORAL | Status: DC
  Administered 2019-03-02 – 2019-03-04 (×4): 60 mg via ORAL
  Filled 2019-03-02 (×4): qty 1

## 2019-03-02 MED ORDER — SUCCINYLCHOLINE CHLORIDE 200 MG/10ML IV SOSY
PREFILLED_SYRINGE | INTRAVENOUS | Status: AC
  Filled 2019-03-02: qty 10

## 2019-03-02 MED ORDER — DOCUSATE SODIUM 100 MG PO CAPS
100.0000 mg | ORAL_CAPSULE | Freq: Two times a day (BID) | ORAL | Status: DC
  Filled 2019-03-02 (×3): qty 1

## 2019-03-02 MED ORDER — METHOCARBAMOL 500 MG IVPB - SIMPLE MED
500.0000 mg | Freq: Four times a day (QID) | INTRAVENOUS | Status: DC | PRN
  Filled 2019-03-02: qty 50

## 2019-03-02 MED ORDER — LIDOCAINE 2% (20 MG/ML) 5 ML SYRINGE
INTRAMUSCULAR | Status: DC | PRN
  Administered 2019-03-02: 20 mg via INTRAVENOUS

## 2019-03-02 MED ORDER — OMEPRAZOLE 20 MG PO CPDR
40.0000 mg | DELAYED_RELEASE_CAPSULE | Freq: Two times a day (BID) | ORAL | Status: DC
  Administered 2019-03-02 – 2019-03-04 (×4): 40 mg via ORAL
  Filled 2019-03-02 (×5): qty 2

## 2019-03-02 MED ORDER — SUCCINYLCHOLINE CHLORIDE 200 MG/10ML IV SOSY
PREFILLED_SYRINGE | INTRAVENOUS | Status: DC | PRN
  Administered 2019-03-02: 60 mg via INTRAVENOUS

## 2019-03-02 MED ORDER — PROPOFOL 10 MG/ML IV BOLUS
INTRAVENOUS | Status: DC | PRN
  Administered 2019-03-02: 150 mg via INTRAVENOUS
  Administered 2019-03-02: 20 mg via INTRAVENOUS
  Administered 2019-03-02: 30 mg via INTRAVENOUS

## 2019-03-02 MED ORDER — LIDOCAINE 2% (20 MG/ML) 5 ML SYRINGE
INTRAMUSCULAR | Status: AC
  Filled 2019-03-02: qty 5

## 2019-03-02 MED ORDER — METOCLOPRAMIDE HCL 5 MG/ML IJ SOLN: 5.0000 mg | Freq: Three times a day (TID) | INTRAMUSCULAR | Status: DC | PRN

## 2019-03-02 MED ORDER — LOSARTAN POTASSIUM 50 MG PO TABS
100.0000 mg | ORAL_TABLET | Freq: Once | ORAL | Status: DC
  Filled 2019-03-02: qty 2

## 2019-03-02 MED ORDER — PHENOL 1.4 % MT LIQD
1.0000 | OROMUCOSAL | Status: DC | PRN
  Filled 2019-03-02: qty 177

## 2019-03-02 MED ORDER — PROPOFOL 10 MG/ML IV BOLUS
INTRAVENOUS | Status: AC
  Filled 2019-03-02: qty 20

## 2019-03-02 MED ORDER — FENTANYL CITRATE (PF) 100 MCG/2ML IJ SOLN
50.0000 ug | INTRAMUSCULAR | Status: DC
  Administered 2019-03-02: 100 ug via INTRAVENOUS
  Filled 2019-03-02: qty 2

## 2019-03-02 MED ORDER — SODIUM CHLORIDE (PF) 0.9 % IJ SOLN
INTRAMUSCULAR | Status: DC | PRN
  Administered 2019-03-02: 30 mL

## 2019-03-02 MED ORDER — ROCURONIUM BROMIDE 50 MG/5ML IV SOSY
PREFILLED_SYRINGE | INTRAVENOUS | Status: DC | PRN
  Administered 2019-03-02: 30 mg via INTRAVENOUS

## 2019-03-02 MED ORDER — KETAMINE HCL 10 MG/ML IJ SOLN
INTRAMUSCULAR | Status: AC
  Filled 2019-03-02: qty 1

## 2019-03-02 MED ORDER — METOCLOPRAMIDE HCL 5 MG PO TABS: 5.0000 mg | ORAL_TABLET | Freq: Three times a day (TID) | ORAL | Status: DC | PRN

## 2019-03-02 MED ORDER — ALUM & MAG HYDROXIDE-SIMETH 200-200-20 MG/5ML PO SUSP: 15.0000 mL | ORAL | Status: DC | PRN

## 2019-03-02 MED ORDER — PROMETHAZINE HCL 25 MG/ML IJ SOLN: 6.2500 mg | INTRAMUSCULAR | Status: DC | PRN

## 2019-03-02 MED ORDER — HYDROMORPHONE HCL 1 MG/ML IJ SOLN: 0.5000 mg | INTRAMUSCULAR | Status: DC | PRN

## 2019-03-02 MED ORDER — 0.9 % SODIUM CHLORIDE (POUR BTL) OPTIME
TOPICAL | Status: DC | PRN
  Administered 2019-03-02: 11:00:00 1000 mL

## 2019-03-02 MED ORDER — ACETAMINOPHEN 325 MG PO TABS: 325.0000 mg | ORAL_TABLET | Freq: Four times a day (QID) | ORAL | Status: DC | PRN

## 2019-03-02 MED ORDER — MAGNESIUM CITRATE PO SOLN: 1.0000 | Freq: Once | ORAL | Status: DC | PRN

## 2019-03-02 MED ORDER — BUPIVACAINE HCL (PF) 0.25 % IJ SOLN
INTRAMUSCULAR | Status: DC | PRN
  Administered 2019-03-02: 30 mL via INTRA_ARTICULAR

## 2019-03-02 MED ORDER — KETOROLAC TROMETHAMINE 30 MG/ML IJ SOLN
INTRAMUSCULAR | Status: AC
  Filled 2019-03-02: qty 1

## 2019-03-02 MED ORDER — SODIUM CHLORIDE 0.9 % IR SOLN
Status: DC | PRN
  Administered 2019-03-02: 1000 mL

## 2019-03-02 MED ORDER — POVIDONE-IODINE 10 % EX SWAB
2.0000 "application " | Freq: Once | CUTANEOUS | Status: AC
  Administered 2019-03-02: 2 via TOPICAL

## 2019-03-02 MED ORDER — SODIUM CHLORIDE 0.9 % IV SOLN: INTRAVENOUS | Status: DC

## 2019-03-02 MED ORDER — ONDANSETRON HCL 4 MG PO TABS: 4.0000 mg | ORAL_TABLET | Freq: Four times a day (QID) | ORAL | Status: DC | PRN

## 2019-03-02 MED ORDER — DEXAMETHASONE SODIUM PHOSPHATE 10 MG/ML IJ SOLN
10.0000 mg | Freq: Once | INTRAMUSCULAR | Status: AC
  Administered 2019-03-03: 10 mg via INTRAVENOUS
  Filled 2019-03-02: qty 1

## 2019-03-02 MED ORDER — SUGAMMADEX SODIUM 200 MG/2ML IV SOLN
INTRAVENOUS | Status: DC | PRN
  Administered 2019-03-02: 125 mg via INTRAVENOUS

## 2019-03-02 MED ORDER — LOSARTAN POTASSIUM 50 MG PO TABS
100.0000 mg | ORAL_TABLET | Freq: Every day | ORAL | Status: DC
  Administered 2019-03-02 – 2019-03-04 (×3): 100 mg via ORAL
  Filled 2019-03-02 (×3): qty 2

## 2019-03-02 MED ORDER — ESMOLOL HCL 100 MG/10ML IV SOLN
INTRAVENOUS | Status: AC
  Filled 2019-03-02: qty 10

## 2019-03-02 MED ORDER — ROCURONIUM BROMIDE 10 MG/ML (PF) SYRINGE
PREFILLED_SYRINGE | INTRAVENOUS | Status: AC
  Filled 2019-03-02: qty 10

## 2019-03-02 MED ORDER — MIDAZOLAM HCL 2 MG/2ML IJ SOLN
1.0000 mg | INTRAMUSCULAR | Status: DC
  Filled 2019-03-02: qty 2

## 2019-03-02 MED ORDER — LACTATED RINGERS IV SOLN: INTRAVENOUS | Status: DC

## 2019-03-02 MED ORDER — POLYETHYLENE GLYCOL 3350 17 G PO PACK
17.0000 g | PACK | Freq: Two times a day (BID) | ORAL | Status: DC
  Filled 2019-03-02 (×3): qty 1

## 2019-03-02 MED ORDER — PROPOFOL 500 MG/50ML IV EMUL
INTRAVENOUS | Status: DC | PRN
  Administered 2019-03-02: 75 ug/kg/min via INTRAVENOUS

## 2019-03-02 MED ORDER — HYDROMORPHONE HCL 1 MG/ML IJ SOLN: 0.2500 mg | INTRAMUSCULAR | Status: DC | PRN

## 2019-03-02 MED ORDER — METHOCARBAMOL 500 MG PO TABS
500.0000 mg | ORAL_TABLET | Freq: Four times a day (QID) | ORAL | Status: DC | PRN
  Administered 2019-03-02 – 2019-03-04 (×5): 500 mg via ORAL
  Filled 2019-03-02 (×5): qty 1

## 2019-03-02 MED ORDER — CEFAZOLIN SODIUM-DEXTROSE 2-4 GM/100ML-% IV SOLN
2.0000 g | Freq: Four times a day (QID) | INTRAVENOUS | Status: AC
  Administered 2019-03-02 (×2): 2 g via INTRAVENOUS
  Filled 2019-03-02 (×2): qty 100

## 2019-03-02 MED ORDER — KETAMINE HCL 10 MG/ML IJ SOLN
INTRAMUSCULAR | Status: DC | PRN
  Administered 2019-03-02: 20 mg via INTRAVENOUS

## 2019-03-02 MED ORDER — DICYCLOMINE HCL 20 MG PO TABS: 20.0000 mg | ORAL_TABLET | Freq: Two times a day (BID) | ORAL | Status: DC | PRN

## 2019-03-02 MED ORDER — CEFAZOLIN SODIUM-DEXTROSE 2-4 GM/100ML-% IV SOLN
2.0000 g | INTRAVENOUS | Status: AC
  Administered 2019-03-02: 2 g via INTRAVENOUS
  Filled 2019-03-02: qty 100

## 2019-03-02 MED ORDER — BISACODYL 10 MG RE SUPP: 10.0000 mg | Freq: Every day | RECTAL | Status: DC | PRN

## 2019-03-02 MED ORDER — FERROUS SULFATE 325 (65 FE) MG PO TABS
325.0000 mg | ORAL_TABLET | Freq: Two times a day (BID) | ORAL | Status: DC
  Administered 2019-03-03 – 2019-03-04 (×3): 325 mg via ORAL
  Filled 2019-03-02 (×3): qty 1

## 2019-03-02 MED ORDER — HYDROCODONE-ACETAMINOPHEN 5-325 MG PO TABS
1.0000 | ORAL_TABLET | ORAL | Status: DC | PRN
  Administered 2019-03-02: 1 via ORAL
  Administered 2019-03-03 (×2): 2 via ORAL
  Filled 2019-03-02 (×2): qty 2
  Filled 2019-03-02: qty 1

## 2019-03-02 MED ORDER — DIPHENHYDRAMINE HCL 12.5 MG/5ML PO ELIX: 12.5000 mg | ORAL_SOLUTION | ORAL | Status: DC | PRN

## 2019-03-02 MED ORDER — KETOROLAC TROMETHAMINE 30 MG/ML IJ SOLN
INTRAMUSCULAR | Status: DC | PRN
  Administered 2019-03-02: 30 mg via INTRA_ARTICULAR

## 2019-03-02 MED ORDER — FENTANYL CITRATE (PF) 100 MCG/2ML IJ SOLN
INTRAMUSCULAR | Status: AC
  Filled 2019-03-02: qty 2

## 2019-03-02 MED ORDER — RIVAROXABAN 10 MG PO TABS
10.0000 mg | ORAL_TABLET | ORAL | Status: DC
  Administered 2019-03-03 – 2019-03-04 (×2): 10 mg via ORAL
  Filled 2019-03-02 (×3): qty 1

## 2019-03-02 MED ORDER — PROPOFOL 500 MG/50ML IV EMUL
INTRAVENOUS | Status: AC
  Filled 2019-03-02: qty 100

## 2019-03-02 MED ORDER — DEXAMETHASONE SODIUM PHOSPHATE 10 MG/ML IJ SOLN
INTRAMUSCULAR | Status: AC
  Filled 2019-03-02: qty 1

## 2019-03-02 MED ORDER — HYDROCODONE-ACETAMINOPHEN 7.5-325 MG PO TABS
1.0000 | ORAL_TABLET | ORAL | Status: DC | PRN
  Administered 2019-03-03 – 2019-03-04 (×4): 2 via ORAL
  Filled 2019-03-02 (×4): qty 2

## 2019-03-02 MED ORDER — STERILE WATER FOR IRRIGATION IR SOLN
Status: DC | PRN
  Administered 2019-03-02: 2000 mL

## 2019-03-02 MED ORDER — DEXAMETHASONE SODIUM PHOSPHATE 10 MG/ML IJ SOLN
INTRAMUSCULAR | Status: DC | PRN
  Administered 2019-03-02 (×2): 10 mg

## 2019-03-02 MED ORDER — DICYCLOMINE HCL 20 MG PO TABS
20.0000 mg | ORAL_TABLET | Freq: Two times a day (BID) | ORAL | Status: DC
  Administered 2019-03-02 – 2019-03-04 (×4): 20 mg via ORAL
  Filled 2019-03-02 (×4): qty 1

## 2019-03-02 MED ORDER — HYDROCODONE-ACETAMINOPHEN 7.5-325 MG PO TABS: 1.0000 | ORAL_TABLET | Freq: Once | ORAL | Status: DC | PRN

## 2019-03-02 MED ORDER — NON FORMULARY: 40.0000 mg | Freq: Two times a day (BID) | Status: DC

## 2019-03-02 SURGICAL SUPPLY — 62 items
ATTUNE MED ANAT PAT 35 KNEE (Knees) ×1 IMPLANT
ATTUNE PS FEM LT SZ 3 CEM KNEE (Femur) ×1 IMPLANT
ATTUNE PSRP INSR SZ3 8 KNEE (Insert) ×1 IMPLANT
BAG ZIPLOCK 12X15 (MISCELLANEOUS) IMPLANT
BASEPLATE TIBIAL ROTATING SZ 4 (Knees) ×1 IMPLANT
BLADE SAW SGTL 11.0X1.19X90.0M (BLADE) ×1 IMPLANT
BLADE SAW SGTL 13.0X1.19X90.0M (BLADE) ×2 IMPLANT
BLADE SURG SZ10 CARB STEEL (BLADE) ×4 IMPLANT
BNDG COHESIVE 6X5 TAN STRL LF (GAUZE/BANDAGES/DRESSINGS) ×1 IMPLANT
BNDG ELASTIC 6X5.8 VLCR STR LF (GAUZE/BANDAGES/DRESSINGS) ×2 IMPLANT
BOWL SMART MIX CTS (DISPOSABLE) ×2 IMPLANT
CEMENT HV SMART SET (Cement) ×2 IMPLANT
COVER SURGICAL LIGHT HANDLE (MISCELLANEOUS) ×2 IMPLANT
COVER WAND RF STERILE (DRAPES) IMPLANT
CUFF TOURN SGL QUICK 34 (TOURNIQUET CUFF) ×1
CUFF TRNQT CYL 34X4.125X (TOURNIQUET CUFF) ×1 IMPLANT
DECANTER SPIKE VIAL GLASS SM (MISCELLANEOUS) ×4 IMPLANT
DERMABOND ADVANCED (GAUZE/BANDAGES/DRESSINGS) ×1
DERMABOND ADVANCED .7 DNX12 (GAUZE/BANDAGES/DRESSINGS) ×1 IMPLANT
DRAPE U-SHAPE 47X51 STRL (DRAPES) ×2 IMPLANT
DRESSING AQUACEL AG SP 3.5X10 (GAUZE/BANDAGES/DRESSINGS) ×1 IMPLANT
DRSG AQUACEL AG SP 3.5X10 (GAUZE/BANDAGES/DRESSINGS) ×2
DURAPREP 26ML APPLICATOR (WOUND CARE) ×4 IMPLANT
ELECT REM PT RETURN 15FT ADLT (MISCELLANEOUS) ×2 IMPLANT
GLOVE BIO SURGEON STRL SZ 6 (GLOVE) ×2 IMPLANT
GLOVE BIOGEL PI IND STRL 6.5 (GLOVE) ×1 IMPLANT
GLOVE BIOGEL PI IND STRL 7.5 (GLOVE) ×1 IMPLANT
GLOVE BIOGEL PI IND STRL 8.5 (GLOVE) ×1 IMPLANT
GLOVE BIOGEL PI INDICATOR 6.5 (GLOVE) ×1
GLOVE BIOGEL PI INDICATOR 7.5 (GLOVE) ×1
GLOVE BIOGEL PI INDICATOR 8.5 (GLOVE) ×1
GLOVE ECLIPSE 8.0 STRL XLNG CF (GLOVE) ×2 IMPLANT
GLOVE ORTHO TXT STRL SZ7.5 (GLOVE) ×2 IMPLANT
GOWN STRL REUS W/ TWL LRG LVL3 (GOWN DISPOSABLE) ×1 IMPLANT
GOWN STRL REUS W/TWL 2XL LVL3 (GOWN DISPOSABLE) ×2 IMPLANT
GOWN STRL REUS W/TWL LRG LVL3 (GOWN DISPOSABLE) ×3 IMPLANT
HANDPIECE INTERPULSE COAX TIP (DISPOSABLE) ×1
HOLDER FOLEY CATH W/STRAP (MISCELLANEOUS) IMPLANT
KIT TURNOVER KIT A (KITS) IMPLANT
MANIFOLD NEPTUNE II (INSTRUMENTS) ×2 IMPLANT
NDL SAFETY ECLIPSE 18X1.5 (NEEDLE) IMPLANT
NEEDLE HYPO 18GX1.5 SHARP (NEEDLE)
NS IRRIG 1000ML POUR BTL (IV SOLUTION) ×2 IMPLANT
PACK TOTAL KNEE CUSTOM (KITS) ×2 IMPLANT
PENCIL SMOKE EVACUATOR (MISCELLANEOUS) IMPLANT
PIN DRILL FIX HALF THREAD (BIT) ×1 IMPLANT
PIN FIX SIGMA LCS THRD HI (PIN) ×1 IMPLANT
PROTECTOR NERVE ULNAR (MISCELLANEOUS) ×2 IMPLANT
SET HNDPC FAN SPRY TIP SCT (DISPOSABLE) ×1 IMPLANT
SET PAD KNEE POSITIONER (MISCELLANEOUS) ×2 IMPLANT
SUT MNCRL AB 4-0 PS2 18 (SUTURE) ×2 IMPLANT
SUT STRATAFIX PDS+ 0 24IN (SUTURE) ×2 IMPLANT
SUT VIC AB 1 CT1 36 (SUTURE) ×2 IMPLANT
SUT VIC AB 2-0 CT1 27 (SUTURE) ×3
SUT VIC AB 2-0 CT1 TAPERPNT 27 (SUTURE) ×3 IMPLANT
SYR 3ML LL SCALE MARK (SYRINGE) ×2 IMPLANT
TRAY FOL W/BAG SLVR 16FR STRL (SET/KITS/TRAYS/PACK) IMPLANT
TRAY FOLEY MTR SLVR 16FR STAT (SET/KITS/TRAYS/PACK) ×1 IMPLANT
TRAY FOLEY W/BAG SLVR 16FR LF (SET/KITS/TRAYS/PACK) ×1
WATER STERILE IRR 1000ML POUR (IV SOLUTION) ×4 IMPLANT
WRAP KNEE MAXI GEL POST OP (GAUZE/BANDAGES/DRESSINGS) ×2 IMPLANT
YANKAUER SUCT BULB TIP 10FT TU (MISCELLANEOUS) ×2 IMPLANT

## 2019-03-02 NOTE — Interval H&P Note (Signed)
History and Physical Interval Note:  03/02/2019 8:58 AM  [name]  has presented today for surgery, with the diagnosis of Left knee osteoarthritis.  The various methods of treatment have been discussed with the patient and family. After consideration of risks, benefits and other options for treatment, the patient has consented to  Procedure(s) with comments: TOTAL KNEE ARTHROPLASTY (Left) - 70 mins as a surgical intervention.  The patient's history has been reviewed, patient examined, no change in status, stable for surgery.  I have reviewed the patient's chart and labs.  Questions were answered to the patient's satisfaction.     Mauri Pole

## 2019-03-02 NOTE — Anesthesia Procedure Notes (Signed)
Procedure Name: Intubation Date/Time: 03/02/2019 10:07 AM Performed by: Maxwell Caul, CRNA Pre-anesthesia Checklist: Patient identified, Emergency Drugs available, Suction available and Patient being monitored Patient Re-evaluated:Patient Re-evaluated prior to induction Oxygen Delivery Method: Circle system utilized Preoxygenation: Pre-oxygenation with 100% oxygen Induction Type: IV induction and Rapid sequence Laryngoscope Size: Mac and 4 Grade View: Grade II Tube type: Oral Tube size: 7.0 mm Number of attempts: 1 Airway Equipment and Method: Stylet Placement Confirmation: ETT inserted through vocal cords under direct vision,  positive ETCO2 and breath sounds checked- equal and bilateral Secured at: 21 cm Tube secured with: Tape Dental Injury: Teeth and Oropharynx as per pre-operative assessment

## 2019-03-02 NOTE — Discharge Instructions (Addendum)

## 2019-03-02 NOTE — Evaluation (Signed)
Physical Therapy Evaluation Patient Details Name: [name] MRN: [id number] DOB: 1945-09-24 Today's Date: 03/02/2019   History of Present Illness  Patient is 74 y.o. female s/p Lt TKA on 03/02/19 with PMH significant for scoliosis, RA, OA, HTN, GERD, Chron's disease, Rt TKA on 07/28/18.    Clinical Impression  [name] is a 74 y.o. female POD 0 s/p Lt TKA. Patient reports independence with mobility at baseline. Patient is now limited by functional impairments (see PT problem list below) and requires min assist for transfers and gait with RW. Patient was able to ambulate ~10 feet with RW and min assist and was limited by pain and overheated sensation. Patient instructed in exercise to facilitate circulation. Patient will benefit from continued skilled PT interventions to address impairments and progress towards PLOF. Acute PT will follow to progress mobility and stair training in preparation for safe discharge home.     Follow Up Recommendations Follow surgeon's recommendation for DC plan and follow-up therapies    Equipment Recommendations  None recommended by PT    Recommendations for Other Services       Precautions / Restrictions Precautions Precautions: Fall Restrictions Weight Bearing Restrictions: No      Mobility  Bed Mobility Overal bed mobility: Needs Assistance Bed Mobility: Supine to Sit     Supine to sit: HOB elevated;Min assist     General bed mobility comments: assist for LE mobility to scoot to EOB and assist to raise trunk upright.  Transfers Overall transfer level: Needs assistance Equipment used: Rolling walker (2 wheeled) Transfers: Sit to/from Stand Sit to Stand: Min assist         General transfer comment: cues for safe hand placement and technique with RW for power up, assist required to initiate and complete power up to rise.  Ambulation/Gait Ambulation/Gait assistance: Min assist Gait Distance (Feet): 10 Feet Assistive device:  Rolling walker (2 wheeled) Gait Pattern/deviations: Step-to pattern Gait velocity: decreased   General Gait Details: cues for step pattern within RW and assist to steady and position with RW. assist at Lt knee to facilitate knee extension initially, pt able to unweight LE with RW use.  Stairs            Wheelchair Mobility    Modified Rankin (Stroke Patients Only)       Balance Overall balance assessment: Needs assistance Sitting-balance support: Feet supported Sitting balance-Leahy Scale: Good     Standing balance support: During functional activity;Bilateral upper extremity supported Standing balance-Leahy Scale: Poor                Pertinent Vitals/Pain Pain Assessment: 0-10 Pain Score: 10-Worst pain ever Pain Location: Lt TKA Pain Descriptors / Indicators: Aching;Sore Pain Intervention(s): Limited activity within patient's tolerance;Monitored during session;Repositioned;Ice applied    Home Living Family/patient expects to be discharged to:: Private residence Living Arrangements: Spouse/significant other Available Help at Discharge: Family Type of Home: House Home Access: Stairs to enter Entrance Stairs-Rails: Left;None Technical brewer of Steps: 1 step with no rail at daughters, 3 steps to get into pt's house with Lt hand rail Home Layout: Two level;Able to live on main level with bedroom/bathroom;Bed/bath upstairs(pt's bedroom is upstairs at her house, she can stay on main floor at her daughters) Home Equipment: Clinical cytogeneticist - 2 wheels;Cane - single point Additional Comments: pt and her husband are going to stay with their daughter for ~ 2 weeks until she has recovered enough to get up stairs safely.    Prior Function  Level of Independence: Independent               Hand Dominance   Dominant Hand: Right    Extremity/Trunk Assessment   Upper Extremity Assessment Upper Extremity Assessment: Overall WFL for tasks assessed    Lower  Extremity Assessment Lower Extremity Assessment: LLE deficits/detail LLE Deficits / Details: pt with good quad activation and no extensor lag with SLR, limited by pain LLE Sensation: WNL LLE Coordination: WNL    Cervical / Trunk Assessment Cervical / Trunk Assessment: Normal  Communication   Communication: No difficulties  Cognition Arousal/Alertness: Awake/alert Behavior During Therapy: WFL for tasks assessed/performed Overall Cognitive Status: Within Functional Limits for tasks assessed           General Comments      Exercises Total Joint Exercises Ankle Circles/Pumps: AROM;Both;15 reps   Assessment/Plan    PT Assessment Patient needs continued PT services  PT Problem List Decreased strength;Decreased mobility;Decreased range of motion;Decreased activity tolerance;Decreased balance;Pain       PT Treatment Interventions DME instruction;Therapeutic exercise;Gait training;Balance training;Stair training;Functional mobility training;Therapeutic activities;Patient/family education    PT Goals (Current goals can be found in the Care Plan section)  Acute Rehab PT Goals Patient Stated Goal: to get back to independent walking without pain PT Goal Formulation: With patient Time For Goal Achievement: 03/09/19 Potential to Achieve Goals: Good    Frequency 7X/week    AM-PAC PT "6 Clicks" Mobility  Outcome Measure Help needed turning from your back to your side while in a flat bed without using bedrails?: A Little Help needed moving from lying on your back to sitting on the side of a flat bed without using bedrails?: A Little Help needed moving to and from a bed to a chair (including a wheelchair)?: A Little Help needed standing up from a chair using your arms (e.g., wheelchair or bedside chair)?: A Little Help needed to walk in hospital room?: A Little Help needed climbing 3-5 steps with a railing? : A Little 6 Click Score: 18    End of Session Equipment Utilized During  Treatment: Gait belt Activity Tolerance: Patient tolerated treatment well Patient left: in chair;with chair alarm set;with family/visitor present;with call bell/phone within reach Nurse Communication: Mobility status PT Visit Diagnosis: Muscle weakness (generalized) (M62.81);Difficulty in walking, not elsewhere classified (R26.2)    Time: 9379-0240 PT Time Calculation (min) (ACUTE ONLY): 23 min   Charges:   PT Evaluation $PT Eval Low Complexity: 1 Low PT Treatments $Gait Training: 8-22 mins       Verner Mould, DPT Physical Therapist with [facility] [phone]  03/02/2019 5:02 PM

## 2019-03-02 NOTE — Anesthesia Preprocedure Evaluation (Addendum)
Anesthesia Evaluation  Patient identified by MRN, date of birth, ID band Patient awake    Reviewed: Allergy & Precautions, NPO status , Patient's Chart, lab work & pertinent test results  History of Anesthesia Complications (+) PONV and history of anesthetic complications  Airway Mallampati: II  TM Distance: >3 FB Neck ROM: Full    Dental no notable dental hx. (+) Teeth Intact, Dental Advisory Given   Pulmonary former smoker,    Pulmonary exam normal breath sounds clear to auscultation       Cardiovascular hypertension, Pt. on medications Normal cardiovascular exam Rhythm:Regular Rate:Normal     Neuro/Psych  Headaches, Hx/o peripheral neuropathy negative psych ROS   GI/Hepatic Neg liver ROS, hiatal hernia, GERD  Medicated and Controlled,Hx/o Pancreatic insufficiency Crohn's disease   Endo/Other  negative endocrine ROS  Renal/GU negative Renal ROS  negative genitourinary   Musculoskeletal  (+) Arthritis , Osteoarthritis and Rheumatoid disorders,    Abdominal Normal abdominal exam  (+)   Peds  Hematology  (+) anemia ,   Anesthesia Other Findings   Reproductive/Obstetrics negative OB ROS                            Anesthesia Physical  Anesthesia Plan  ASA: III  Anesthesia Plan: General and Regional   Post-op Pain Management:  Regional for Post-op pain   Induction: Intravenous, Rapid sequence and Cricoid pressure planned  PONV Risk Score and Plan: 4 or greater and Ondansetron, Dexamethasone and Treatment may vary due to age or medical condition  Airway Management Planned: Oral ETT  Additional Equipment: None  Intra-op Plan:   Post-operative Plan: Extubation in OR  Informed Consent: I have reviewed the patients History and Physical, chart, labs and discussed the procedure including the risks, benefits and alternatives for the proposed anesthesia with the patient or authorized  representative who has indicated his/her understanding and acceptance.     Dental advisory given  Plan Discussed with: CRNA and Surgeon  Anesthesia Plan Comments: (Failed spinal in the past for last knee surgery. Will do GA/ETT- has large hiatal hernia, is scheduled to see a surgeon for this in March. Will RSI  Nauseous and dry-heaving in preop after fentanyl given IV for block. True RSI, will avoid any more fentanyl states hydrocodone worked well for her in the past for her previous knee surgery.)     Anesthesia Quick Evaluation

## 2019-03-02 NOTE — Anesthesia Postprocedure Evaluation (Addendum)
Anesthesia Post Note  Patient: Sala I Pettey  Procedure(s) Performed: TOTAL KNEE ARTHROPLASTY (Left Knee)     Patient location during evaluation: PACU Anesthesia Type: Regional Level of consciousness: awake and alert, oriented and patient cooperative Pain management: pain level controlled Vital Signs Assessment: post-procedure vital signs reviewed and stable Respiratory status: spontaneous breathing, nonlabored ventilation and respiratory function stable Cardiovascular status: blood pressure returned to baseline and stable Postop Assessment: no apparent nausea or vomiting Anesthetic complications: no Comments: Hypertensive at baseline. outpatient losartan dose and labetalol in PACU    Last Vitals:  Vitals:   03/02/19 1205 03/02/19 1215  BP: (!) 181/111 (!) 164/109  Pulse: 93 99  Resp: 13 11  Temp: 36.4 C   SpO2: 95% 96%    Last Pain:  Vitals:   03/02/19 1205  TempSrc:   PainSc: [place]

## 2019-03-02 NOTE — Addendum Note (Signed)
Addendum  created 03/02/19 1235 by Pervis Hocking, DO   Clinical Note Signed, Order list changed

## 2019-03-02 NOTE — Op Note (Signed)
NAME:  [name]                      MEDICAL RECORD NO.:  [id number]                             FACILITY:  [facility]      PHYSICIAN:  Pietro Cassis. Alvan Dame, M.D.  DATE OF BIRTH:  1945-12-10      DATE OF PROCEDURE:  03/02/2019                                     OPERATIVE REPORT         PREOPERATIVE DIAGNOSIS:  Left knee osteoarthritis.      POSTOPERATIVE DIAGNOSIS:  Left knee osteoarthritis.      FINDINGS:  The patient was noted to have complete loss of cartilage and   bone-on-bone arthritis with associated osteophytes in all three compartments of   the knee with a significant synovitis and associated effusion.  The patient had failed months of conservative treatment including medications, injection therapy, activity modification.     PROCEDURE:  Left total knee replacement.      COMPONENTS USED:  DePuy Attune rotating platform posterior stabilized knee   system, a size 3 femur, 4 tibia, size 8 mm PS AOX insert, and 35 anatomic patellar   button.      SURGEON:  Pietro Cassis. Alvan Dame, M.D.      ASSISTANT:  Griffith Citron, PA-C.      ANESTHESIA:  General and Regional.      SPECIMENS:  None.      COMPLICATION:  None.      DRAINS:  None.  EBL: <150cc      TOURNIQUET TIME:  32 min at 250 mmHg     The patient was stable to the recovery room.      INDICATION FOR PROCEDURE:  [name] is a 74 y.o. female patient of   mine.  The patient had been seen, evaluated, and treated for months conservatively in the   office with medication, activity modification, and injections.  The patient had   radiographic changes of bone-on-bone arthritis with endplate sclerosis and osteophytes noted.  Based on the radiographic changes and failed conservative measures, the patient   decided to proceed with definitive treatment, total knee replacement.  Risks of infection, DVT, component failure, need for revision surgery, neurovascular injury were reviewed in the office setting.  The postop course was  reviewed stressing the efforts to maximize post-operative satisfaction and function.  Consent was obtained for benefit of pain   relief.      PROCEDURE IN DETAIL:  The patient was brought to the operative theater.   Once adequate anesthesia, preoperative antibiotics, 2 gm of Ancef,1 gm of Tranexamic Acid, and 10 mg of Decadron administered, the patient was positioned supine with a left thigh tourniquet placed.  The  left lower extremity was prepped and draped in sterile fashion.  A time-   out was performed identifying the patient, planned procedure, and the appropriate extremity.      The left lower extremity was placed in the [facility] leg holder.  The leg was   exsanguinated, tourniquet elevated to 250 mmHg.  A midline incision was   made followed by median parapatellar arthrotomy.  Following initial   exposure, attention was first directed to  the patella.  Precut   measurement was noted to be 22-23 mm.  I resected down to 13-14 mm and used a   35 anatomic patellar button to restore patellar height as well as cover the cut surface.      The lug holes were drilled and a metal shim was placed to protect the   patella from retractors and saw blade during the procedure.      At this point, attention was now directed to the femur.  The femoral   canal was opened with a drill, irrigated to try to prevent fat emboli.  An   intramedullary rod was passed at 3 degrees valgus, 9 mm of bone was   resected off the distal femur.  Following this resection, the tibia was   subluxated anteriorly.  Using the extramedullary guide, 2 mm of bone was resected off   the proximal medial tibia.  We confirmed the gap would be   stable medially and laterally with a size 6 spacer block as well as confirmed that the tibial cut was perpendicular in the coronal plane, checking with an alignment rod.      Once this was done, I sized the femur to be a size 3 in the anterior-   posterior dimension, chose a standard  component based on medial and   lateral dimension.  The size 3 rotation block was then pinned in   position anterior referenced using the C-clamp to set rotation.  The   anterior, posterior, and  chamfer cuts were made without difficulty nor   notching making certain that I was along the anterior cortex to help   with flexion gap stability.      The final box cut was made off the lateral aspect of distal femur.      At this point, the tibia was sized to be a size 4.  The size 4 tray was   then pinned in position through the medial third of the tubercle,   drilled, and keel punched.  Trial reduction was now carried with a 3 femur,  4 tibia, a size 8 mm PS insert, and the 35 anatomic patella botton.  The knee was brought to full extension with good flexion stability with the patella   tracking through the trochlea without application of pressure.  Given   all these findings the trial components removed.  Final components were   opened and cement was mixed.  The knee was irrigated with normal saline solution and pulse lavage.  The synovial lining was   then injected with 30 cc of 0.25% Marcaine with epinephrine, 1 cc of Toradol and 30 cc of NS for a total of 61 cc.     Final implants were then cemented onto cleaned and dried cut surfaces of bone with the knee brought to extension with a size 8 mm PS trial insert.      Once the cement had fully cured, excess cement was removed   throughout the knee.  I confirmed that I was satisfied with the range of   motion and stability, and the final size 8 mm PS AOX insert was chosen.  It was   placed into the knee.      The tourniquet had been let down at 32 minutes.  No significant   hemostasis was required.  The extensor mechanism was then reapproximated using #1 Vicryl and #1 Stratafix sutures with the knee   in flexion.  The   remaining wound  was closed with 2-0 Vicryl and running 4-0 Monocryl.   The knee was cleaned, dried, dressed sterilely using  Dermabond and   Aquacel dressing.  The patient was then   brought to recovery room in stable condition, tolerating the procedure   well.   Please note that Physician Assistant, Griffith Citron, PA-C was present for the entirety of the case, and was utilized for pre-operative positioning, peri-operative retractor management, general facilitation of the procedure and for primary wound closure at the end of the case.              Pietro Cassis Alvan Dame, M.D.    03/02/2019 10:08 AM

## 2019-03-02 NOTE — Progress Notes (Signed)
AssistedDr. Criss Rosales with left, ultrasound guided, adductor canal block. Side rails up, monitors on throughout procedure. See vital signs in flow sheet. Tolerated Procedure well.

## 2019-03-02 NOTE — Transfer of Care (Signed)
Immediate Anesthesia Transfer of Care Note  Patient: [name]  Procedure(s) Performed: TOTAL KNEE ARTHROPLASTY (Left Knee)  Patient Location: PACU  Anesthesia Type:General  Level of Consciousness: awake, alert  and oriented  Airway & Oxygen Therapy: Patient Spontanous Breathing and Patient connected to face mask oxygen  Post-op Assessment: Report given to RN and Post -op Vital signs reviewed and stable  Post vital signs: Reviewed and stable  Last Vitals:  Vitals Value Taken Time  BP 188/106 03/02/19 1208  Temp 36.4 C 03/02/19 1205  Pulse 92 03/02/19 1209  Resp 11 03/02/19 1209  SpO2 96 % 03/02/19 1209  Vitals shown include unvalidated device data.  Last Pain:  Vitals:   03/02/19 1205  TempSrc:   PainSc: (P) Asleep         Complications: No apparent anesthesia complications

## 2019-03-02 NOTE — Anesthesia Procedure Notes (Signed)
Anesthesia Regional Block: Adductor canal block   Pre-Anesthetic Checklist: ,, timeout performed, Correct Patient, Correct Site, Correct Laterality, Correct Procedure, Correct Position, site marked, Risks and benefits discussed,  Surgical consent,  Pre-op evaluation,  At surgeon's request and post-op pain management  Laterality: Left  Prep: Maximum Sterile Barrier Precautions used, chloraprep       Needles:  Injection technique: Single-shot  Needle Type: Echogenic Stimulator Needle     Needle Length: 9cm  Needle Gauge: 22     Additional Needles:   Procedures:,,,, ultrasound used (permanent image in chart),,,,  Narrative:  Start time: 03/02/2019 9:15 AM End time: 03/02/2019 9:20 AM Injection made incrementally with aspirations every 5 mL.  Performed by: Personally  Anesthesiologist: Pervis Hocking, DO  Additional Notes: Monitors applied. No increased pain on injection. No increased resistance to injection. Injection made in 5cc increments. Good needle visualization. Patient tolerated procedure well.

## 2019-03-03 ENCOUNTER — Encounter: Payer: Self-pay | Admitting: *Deleted

## 2019-03-03 DIAGNOSIS — K5090 Crohn's disease, unspecified, without complications: Secondary | ICD-10-CM | POA: Diagnosis present

## 2019-03-03 DIAGNOSIS — Z6825 Body mass index (BMI) 25.0-25.9, adult: Secondary | ICD-10-CM | POA: Diagnosis not present

## 2019-03-03 DIAGNOSIS — M1990 Unspecified osteoarthritis, unspecified site: Secondary | ICD-10-CM | POA: Diagnosis present

## 2019-03-03 DIAGNOSIS — M069 Rheumatoid arthritis, unspecified: Secondary | ICD-10-CM | POA: Diagnosis present

## 2019-03-03 DIAGNOSIS — G629 Polyneuropathy, unspecified: Secondary | ICD-10-CM | POA: Diagnosis present

## 2019-03-03 DIAGNOSIS — Z9841 Cataract extraction status, right eye: Secondary | ICD-10-CM | POA: Diagnosis not present

## 2019-03-03 DIAGNOSIS — Z885 Allergy status to narcotic agent status: Secondary | ICD-10-CM | POA: Diagnosis not present

## 2019-03-03 DIAGNOSIS — Z9842 Cataract extraction status, left eye: Secondary | ICD-10-CM | POA: Diagnosis not present

## 2019-03-03 DIAGNOSIS — Z96651 Presence of right artificial knee joint: Secondary | ICD-10-CM | POA: Diagnosis present

## 2019-03-03 DIAGNOSIS — M25562 Pain in left knee: Secondary | ICD-10-CM | POA: Diagnosis present

## 2019-03-03 DIAGNOSIS — K219 Gastro-esophageal reflux disease without esophagitis: Secondary | ICD-10-CM | POA: Diagnosis present

## 2019-03-03 DIAGNOSIS — Z961 Presence of intraocular lens: Secondary | ICD-10-CM | POA: Diagnosis present

## 2019-03-03 DIAGNOSIS — I10 Essential (primary) hypertension: Secondary | ICD-10-CM | POA: Diagnosis present

## 2019-03-03 DIAGNOSIS — Z91040 Latex allergy status: Secondary | ICD-10-CM | POA: Diagnosis not present

## 2019-03-03 DIAGNOSIS — M1712 Unilateral primary osteoarthritis, left knee: Secondary | ICD-10-CM | POA: Diagnosis present

## 2019-03-03 DIAGNOSIS — Z881 Allergy status to other antibiotic agents status: Secondary | ICD-10-CM | POA: Diagnosis not present

## 2019-03-03 DIAGNOSIS — Z886 Allergy status to analgesic agent status: Secondary | ICD-10-CM | POA: Diagnosis not present

## 2019-03-03 DIAGNOSIS — Z87891 Personal history of nicotine dependence: Secondary | ICD-10-CM | POA: Diagnosis not present

## 2019-03-03 DIAGNOSIS — M25762 Osteophyte, left knee: Secondary | ICD-10-CM | POA: Diagnosis present

## 2019-03-03 DIAGNOSIS — M659 Synovitis and tenosynovitis, unspecified: Secondary | ICD-10-CM | POA: Diagnosis present

## 2019-03-03 DIAGNOSIS — E739 Lactose intolerance, unspecified: Secondary | ICD-10-CM | POA: Diagnosis present

## 2019-03-03 DIAGNOSIS — Z85828 Personal history of other malignant neoplasm of skin: Secondary | ICD-10-CM | POA: Diagnosis not present

## 2019-03-03 DIAGNOSIS — M419 Scoliosis, unspecified: Secondary | ICD-10-CM | POA: Diagnosis present

## 2019-03-03 DIAGNOSIS — G8929 Other chronic pain: Secondary | ICD-10-CM | POA: Diagnosis present

## 2019-03-03 DIAGNOSIS — E663 Overweight: Secondary | ICD-10-CM | POA: Diagnosis present

## 2019-03-03 LAB — BASIC METABOLIC PANEL
Anion gap: 11 (ref 5–15)
BUN: 25 mg/dL — ABNORMAL HIGH (ref 8–23)
CO2: 22 mmol/L (ref 22–32)
Calcium: 8.4 mg/dL — ABNORMAL LOW (ref 8.9–10.3)
Chloride: 105 mmol/L (ref 98–111)
Creatinine, Ser: 0.92 mg/dL (ref 0.44–1.00)
GFR calc Af Amer: >60 mL/min (ref >60)
GFR calc non Af Amer: >60 mL/min (ref >60)
Glucose, Bld: 129 mg/dL — ABNORMAL HIGH (ref 70–99)
Potassium: 4.7 mmol/L (ref 3.5–5.1)
Sodium: 138 mmol/L (ref 135–145)

## 2019-03-03 LAB — CBC
HCT: 28.1 % — ABNORMAL LOW (ref 36.0–46.0)
Hemoglobin: 9.4 g/dL — ABNORMAL LOW (ref 12.0–15.0)
MCH: 30.8 pg (ref 26.0–34.0)
MCHC: 33.5 g/dL (ref 30.0–36.0)
MCV: 92.1 fL (ref 80.0–100.0)
Platelets: 277 10*3/uL (ref 150–400)
RBC: 3.05 MIL/uL — ABNORMAL LOW (ref 3.87–5.11)
RDW: 14.5 % (ref 11.5–15.5)
WBC: 14.4 10*3/uL — ABNORMAL HIGH (ref 4.0–10.5)
nRBC: 0 % (ref 0.0–0.2)

## 2019-03-03 MED ORDER — POLYETHYLENE GLYCOL 3350 17 G PO PACK: 17.0000 g | PACK | Freq: Two times a day (BID) | ORAL | 0 refills | Status: DC

## 2019-03-03 MED ORDER — DOXYCYCLINE HYCLATE 100 MG PO CAPS: 100.0000 mg | ORAL_CAPSULE | Freq: Two times a day (BID) | ORAL | 0 refills | Status: DC

## 2019-03-03 MED ORDER — DOCUSATE SODIUM 100 MG PO CAPS: 100.0000 mg | ORAL_CAPSULE | Freq: Two times a day (BID) | ORAL | 0 refills | Status: DC

## 2019-03-03 MED ORDER — HYDROCODONE-ACETAMINOPHEN 5-325 MG PO TABS: 1.0000 | ORAL_TABLET | ORAL | 0 refills | Status: DC | PRN

## 2019-03-03 MED ORDER — METHOCARBAMOL 500 MG PO TABS: 500.0000 mg | ORAL_TABLET | Freq: Four times a day (QID) | ORAL | 0 refills | Status: DC | PRN

## 2019-03-03 MED ORDER — RIVAROXABAN 10 MG PO TABS: 10.0000 mg | ORAL_TABLET | Freq: Every day | ORAL | 0 refills | Status: DC

## 2019-03-03 MED ORDER — FERROUS SULFATE 325 (65 FE) MG PO TABS: 325.0000 mg | ORAL_TABLET | Freq: Three times a day (TID) | ORAL | 0 refills | Status: DC

## 2019-03-03 NOTE — Progress Notes (Signed)
Physical Therapy Treatment Patient Details Name: [name] MRN: [id number] DOB: [birth year] Today's Date: 03/03/2019    History of Present Illness Patient is 74 y.o. female s/p Lt TKA on 03/02/19 with PMH significant for scoliosis, RA, OA, HTN, GERD, Chron's disease, Rt TKA on 07/28/18.    PT Comments    Pt progressing with PT however pain L knee remains 8-9/10, despite meds. Reviewed gait, stairs and TKA HEP. disucssed with RN aware of pt's pain level. Will continue to follow if pt does not d/c today   Follow Up Recommendations  Follow surgeon's recommendation for DC plan and follow-up therapies     Equipment Recommendations  None recommended by PT    Recommendations for Other Services       Precautions / Restrictions Precautions Precautions: Knee;Fall Restrictions Weight Bearing Restrictions: No    Mobility  Bed Mobility Overal bed mobility: Needs Assistance Bed Mobility: Supine to Sit;Sit to Supine     Supine to sit: Supervision Sit to supine: Supervision   General bed mobility comments: for safety  Transfers Overall transfer level: Needs assistance Equipment used: Rolling walker (2 wheeled) Transfers: Sit to/from Stand Sit to Stand: Min guard         General transfer comment: cues for hand placement   Ambulation/Gait Ambulation/Gait assistance: Min guard Gait Distance (Feet): 40 Feet(10' more ) Assistive device: Rolling walker (2 wheeled) Gait Pattern/deviations: Step-to pattern Gait velocity: decreased   General Gait Details: cues for sequence and RW position, limited by pain   Stairs Stairs: Yes Stairs assistance: Min assist Stair Management: No rails;Step to pattern;Backwards;With walker Number of Stairs: 2 General stair comments: cues for sequence, assist for balance and RW positioning    Wheelchair Mobility    Modified Rankin (Stroke Patients Only)       Balance                                             Cognition Arousal/Alertness: Awake/alert Behavior During Therapy: WFL for tasks assessed/performed Overall Cognitive Status: Within Functional Limits for tasks assessed                                        Exercises Total Joint Exercises Ankle Circles/Pumps: AROM;Both;15 reps Quad Sets: AROM;Both;10 reps Heel Slides: AROM;AAROM;Left;10 reps Hip ABduction/ADduction: AROM;Left;10 reps Straight Leg Raises: AROM;Left;10 reps Goniometric ROM: grossly 8 to 75 degrees flexion L knee    General Comments        Pertinent Vitals/Pain Pain Assessment: 0-10 Pain Score: 5  Pain Location: Lt TKA Pain Descriptors / Indicators: Aching;Sore Pain Intervention(s): Limited activity within patient's tolerance;Monitored during session;Repositioned    Home Living                      Prior Function            PT Goals (current goals can now be found in the care plan section) Acute Rehab PT Goals Patient Stated Goal: to get back to independent walking without pain PT Goal Formulation: With patient Time For Goal Achievement: 03/09/19 Potential to Achieve Goals: Good Progress towards PT goals: Progressing toward goals    Frequency    7X/week      PT Plan Current plan remains appropriate    Co-evaluation  AM-PAC PT "6 Clicks" Mobility   Outcome Measure  Help needed turning from your back to your side while in a flat bed without using bedrails?: A Little Help needed moving from lying on your back to sitting on the side of a flat bed without using bedrails?: A Little Help needed moving to and from a bed to a chair (including a wheelchair)?: A Little Help needed standing up from a chair using your arms (e.g., wheelchair or bedside chair)?: A Little Help needed to walk in hospital room?: A Little Help needed climbing 3-5 steps with a railing? : A Little 6 Click Score: 18    End of Session Equipment Utilized During Treatment: Gait  belt Activity Tolerance: Patient tolerated treatment well Patient left: in bed;with call bell/phone within reach;with bed alarm set Nurse Communication: Mobility status PT Visit Diagnosis: Muscle weakness (generalized) (M62.81);Difficulty in walking, not elsewhere classified (R26.2)     Time: 6770-3403 PT Time Calculation (min) (ACUTE ONLY): 22 min  Charges:  $Gait Training: 8-22 mins                     Baxter Flattery, PT   Acute Rehab Dept [facility]): [phone]   03/03/2019    [facility] 03/03/2019, 3:18 PM

## 2019-03-03 NOTE — TOC Progression Note (Signed)
Transition of Care [facility]) - Progression Note    Patient Details  Name: [name] MRN: [id number] Date of Birth: 1945/12/10  Transition of Care [facility]) CM/SW [place], LCSW Phone Number: 03/03/2019, 1:09 PM  Clinical Narrative:    Therapy Plan: OPPT Patient has DME     Barriers to Discharge: No Barriers Identified  Expected Discharge Plan and Services           Expected Discharge Date: 03/03/19               DME Arranged: (Has DME)                     Social Determinants of Health (SDOH) Interventions    Readmission Risk Interventions No flowsheet data found.

## 2019-03-03 NOTE — Progress Notes (Signed)
     Subjective: 1 Day Post-Op Procedure(s) (LRB): TOTAL KNEE ARTHROPLASTY (Left)   Patient reports pain as mild, pain controlled.  No reported events with the night.  Dr. Alvan Dame discussed the procedure, findings and expectations moving forward.  Patient is ready be discharged home, she does well with therapy.   Patient's anticipated LOS is less than 2 midnights, meeting these requirements: - Younger than 92 - Lives within 1 hour of care - Has a competent adult at home to recover with post-op recover - NO history of  - Chronic pain requiring opiods  - Diabetes  - Coronary Artery Disease  - Heart failure  - Heart attack  - Stroke  - DVT/VTE  - Cardiac arrhythmia  - Respiratory Failure/COPD  - Renal failure  - Anemia  - Advanced Liver disease    Objective:   VITALS:   Vitals:   03/03/19 0222 03/03/19 0550  BP: 139/83 114/73  Pulse: (!) 109 (!) 103  Resp: 18 18  Temp: 98.6 F (37 C) 99.2 F (37.3 C)  SpO2: 98% 94%    Dorsiflexion/Plantar flexion intact Incision: dressing C/D/I No cellulitis present Compartment soft  LABS Recent Labs    03/03/19 0427  HGB 9.4*  HCT 28.1*  WBC 14.4*  PLT 277    Recent Labs    03/03/19 0427  NA 138  K 4.7  BUN 25*  CREATININE 0.92  GLUCOSE 129*     Assessment/Plan: 1 Day Post-Op Procedure(s) (LRB): TOTAL KNEE ARTHROPLASTY (Left) Foley cath d/c'ed Advance diet Up with therapy D/C IV fluids Discharge home Follow up in 2 weeks at [facility] Follow up with OLIN,Khale Nigh D in 2 weeks.  Contact information:  [facility] [address] [place] [address] [phone]    Overweight (BMI 25-29.9) Estimated body mass index is 25.97 kg/m as calculated from the following:   Height as of this encounter: 5' (1.524 m).   Weight as of this encounter: 60.3 kg. Patient also counseled that weight may inhibit the healing process Patient counseled that losing weight will help with future  health issues      Danae Orleans PA-C  [facility] [facility]  [place] [address], [place], [place] [address] Phone: [phone] [email or web address] Facebook  [employer]

## 2019-03-03 NOTE — Progress Notes (Signed)
Physical Therapy Treatment Patient Details Name: [name] MRN: [id number] DOB: 01/28/1945 Today's Date: 03/03/2019    History of Present Illness Patient is 74 y.o. female s/p Lt TKA on 03/02/19 with PMH significant for scoliosis, RA, OA, HTN, GERD, Chron's disease, Rt TKA on 07/28/18.    PT Comments    Pt progressing however pain elevated significantly. Ice to knee, RN notified. Will see again latera today   Follow Up Recommendations  Follow surgeon's recommendation for DC plan and follow-up therapies     Equipment Recommendations  None recommended by PT    Recommendations for Other Services       Precautions / Restrictions Precautions Precautions: Knee;Fall Restrictions Weight Bearing Restrictions: No    Mobility  Bed Mobility Overal bed mobility: Needs Assistance Bed Mobility: Sit to Supine       Sit to supine: Supervision   General bed mobility comments: cues for self assist   Transfers Overall transfer level: Needs assistance Equipment used: Rolling walker (2 wheeled) Transfers: Sit to/from Stand Sit to Stand: Min assist         General transfer comment: cues for hand placement   Ambulation/Gait Ambulation/Gait assistance: Min guard;Min assist Gait Distance (Feet): 50 Feet Assistive device: Rolling walker (2 wheeled) Gait Pattern/deviations: Step-to pattern Gait velocity: decreased   General Gait Details: cues for sequence    Stairs             Wheelchair Mobility    Modified Rankin (Stroke Patients Only)       Balance                                            Cognition Arousal/Alertness: Awake/alert Behavior During Therapy: WFL for tasks assessed/performed Overall Cognitive Status: Within Functional Limits for tasks assessed                                        Exercises      General Comments        Pertinent Vitals/Pain Pain Assessment: 0-10 Pain Score: 9  Pain Location: Lt  TKA Pain Descriptors / Indicators: Aching;Sore Pain Intervention(s): Limited activity within patient's tolerance;Monitored during session;Premedicated before session;Repositioned;Patient requesting pain meds-RN notified;Ice applied    Home Living                      Prior Function            PT Goals (current goals can now be found in the care plan section) Acute Rehab PT Goals Patient Stated Goal: to get back to independent walking without pain PT Goal Formulation: With patient Time For Goal Achievement: 03/09/19 Potential to Achieve Goals: Good Progress towards PT goals: Progressing toward goals    Frequency    7X/week      PT Plan Current plan remains appropriate    Co-evaluation              AM-PAC PT "6 Clicks" Mobility   Outcome Measure  Help needed turning from your back to your side while in a flat bed without using bedrails?: A Little Help needed moving from lying on your back to sitting on the side of a flat bed without using bedrails?: A Little Help needed moving to and from a bed to  a chair (including a wheelchair)?: A Little Help needed standing up from a chair using your arms (e.g., wheelchair or bedside chair)?: A Little Help needed to walk in hospital room?: A Little Help needed climbing 3-5 steps with a railing? : A Little 6 Click Score: 18    End of Session   Activity Tolerance: Patient tolerated treatment well Patient left: in bed;with call bell/phone within reach;with bed alarm set Nurse Communication: Mobility status PT Visit Diagnosis: Muscle weakness (generalized) (M62.81);Difficulty in walking, not elsewhere classified (R26.2)     Time: 6226-3335 PT Time Calculation (min) (ACUTE ONLY): 20 min  Charges:  $Gait Training: 8-22 mins                     Baxter Flattery, PT   Acute Rehab Dept [facility]): [phone]   03/03/2019    [facility] 03/03/2019, 1:45 PM

## 2019-03-04 LAB — CBC
HCT: 29.1 % — ABNORMAL LOW (ref 36.0–46.0)
Hemoglobin: 9.8 g/dL — ABNORMAL LOW (ref 12.0–15.0)
MCH: 30.5 pg (ref 26.0–34.0)
MCHC: 33.7 g/dL (ref 30.0–36.0)
MCV: 90.7 fL (ref 80.0–100.0)
Platelets: 265 10*3/uL (ref 150–400)
RBC: 3.21 MIL/uL — ABNORMAL LOW (ref 3.87–5.11)
RDW: 14.1 % (ref 11.5–15.5)
WBC: 15.1 10*3/uL — ABNORMAL HIGH (ref 4.0–10.5)
nRBC: 0 % (ref 0.0–0.2)

## 2019-03-04 LAB — BASIC METABOLIC PANEL
Anion gap: 10 (ref 5–15)
BUN: 24 mg/dL — ABNORMAL HIGH (ref 8–23)
CO2: 24 mmol/L (ref 22–32)
Calcium: 8.9 mg/dL (ref 8.9–10.3)
Chloride: 103 mmol/L (ref 98–111)
Creatinine, Ser: 0.91 mg/dL (ref 0.44–1.00)
GFR calc Af Amer: >60 mL/min (ref >60)
GFR calc non Af Amer: >60 mL/min (ref >60)
Glucose, Bld: 107 mg/dL — ABNORMAL HIGH (ref 70–99)
Potassium: 4.1 mmol/L (ref 3.5–5.1)
Sodium: 137 mmol/L (ref 135–145)

## 2019-03-04 NOTE — Progress Notes (Signed)
Physical Therapy Treatment Patient Details Name: [name] MRN: [id number] DOB: September 11, 1945 Today's Date: 03/04/2019    History of Present Illness Patient is 74 y.o. female s/p Lt TKA on 03/02/19 with PMH significant for scoliosis, RA, OA, HTN, GERD, Chron's disease, Rt TKA on 07/28/18.    PT Comments    Pt progressing slowly but steadily with mobility - largely pain limited.   Follow Up Recommendations  Follow surgeon's recommendation for DC plan and follow-up therapies     Equipment Recommendations  None recommended by PT    Recommendations for Other Services       Precautions / Restrictions Precautions Precautions: Knee;Fall Restrictions Weight Bearing Restrictions: No    Mobility  Bed Mobility Overal bed mobility: Needs Assistance Bed Mobility: Supine to Sit;Sit to Supine     Supine to sit: Supervision Sit to supine: Supervision   General bed mobility comments: for safety, increased time  Transfers Overall transfer level: Needs assistance Equipment used: Rolling walker (2 wheeled) Transfers: Sit to/from Stand Sit to Stand: Min guard;Supervision         General transfer comment: cues for hand placement   Ambulation/Gait Ambulation/Gait assistance: Min Gaffer (Feet): 62 Feet Assistive device: Rolling walker (2 wheeled) Gait Pattern/deviations: Step-to pattern Gait velocity: decreased   General Gait Details: cues for sequence and RW position, limited by pain   Stairs         General stair comments: Pt declines stairs, states comfortable with ability after yesterday and previous TKR; written instruction provided   Wheelchair Mobility    Modified Rankin (Stroke Patients Only)       Balance Overall balance assessment: Needs assistance Sitting-balance support: Feet supported Sitting balance-Leahy Scale: Good     Standing balance support: During functional activity;Bilateral upper extremity supported Standing  balance-Leahy Scale: Fair                              Cognition Arousal/Alertness: Awake/alert Behavior During Therapy: WFL for tasks assessed/performed Overall Cognitive Status: Within Functional Limits for tasks assessed                                        Exercises Total Joint Exercises Ankle Circles/Pumps: AROM;Both;15 reps Quad Sets: AROM;Both;10 reps Heel Slides: AROM;AAROM;Left;10 reps Hip ABduction/ADduction: AROM;Left;10 reps Straight Leg Raises: Left;10 reps;AAROM;AROM;Supine Goniometric ROM: 8 - 65 pain limited    General Comments        Pertinent Vitals/Pain Pain Assessment: 0-10 Pain Score: 8  Pain Location: Lt TKA Pain Descriptors / Indicators: Aching;Sore Pain Intervention(s): Monitored during session;Limited activity within patient's tolerance;Premedicated before session;Ice applied    Home Living                      Prior Function            PT Goals (current goals can now be found in the care plan section) Acute Rehab PT Goals Patient Stated Goal: to get back to independent walking without pain PT Goal Formulation: With patient Time For Goal Achievement: 03/09/19 Potential to Achieve Goals: Good Progress towards PT goals: Progressing toward goals    Frequency    7X/week      PT Plan Current plan remains appropriate    Co-evaluation              AM-PAC  PT "6 Clicks" Mobility   Outcome Measure  Help needed turning from your back to your side while in a flat bed without using bedrails?: A Little Help needed moving from lying on your back to sitting on the side of a flat bed without using bedrails?: A Little Help needed moving to and from a bed to a chair (including a wheelchair)?: A Little Help needed standing up from a chair using your arms (e.g., wheelchair or bedside chair)?: A Little Help needed to walk in hospital room?: A Little Help needed climbing 3-5 steps with a railing? : A  Little 6 Click Score: 18    End of Session Equipment Utilized During Treatment: Gait belt Activity Tolerance: Patient limited by pain Patient left: in bed;with call bell/phone within reach;with bed alarm set Nurse Communication: Mobility status PT Visit Diagnosis: Muscle weakness (generalized) (M62.81);Difficulty in walking, not elsewhere classified (R26.2)     Time: 4862-8241 PT Time Calculation (min) (ACUTE ONLY): 30 min  Charges:  $Gait Training: 8-22 mins $Therapeutic Exercise: 8-22 mins                     Debe Coder PT Acute Rehabilitation Services Pager [phone] Office [phone]    [name] 03/04/2019, 10:22 AM

## 2019-03-04 NOTE — Plan of Care (Signed)
  Problem: Education: Goal: Knowledge of General Education information will improve Description: Including pain rating scale, medication(s)/side effects and non-pharmacologic comfort measures Outcome: Progressing   Problem: Activity: Goal: Risk for activity intolerance will decrease Outcome: Progressing   

## 2019-03-04 NOTE — Progress Notes (Signed)
     Subjective: 2 Days Post-Op Procedure(s) (LRB): TOTAL KNEE ARTHROPLASTY (Left)   Patient reports pain as mild, pain controlled medication.  Patient had difficulty ambulating due to pain yesterday.  Patient states most of her pain is in the upper thigh area of the tourniquet.  We discussed slowly progressing as well as the bruising that may occur due to the tourniquet.  Patient states she would like to go home and we will plan on this today.  Patient be ready for discharge home, she does well therapy.     Objective:   VITALS:   Vitals:   03/03/19 2104 03/04/19 0613  BP: (!) 152/84 (!) 150/80  Pulse: (!) 106 (!) 110  Resp: 16 20  Temp: 98.4 F (36.9 C) 98.2 F (36.8 C)  SpO2: 91% 91%    Dorsiflexion/Plantar flexion intact Incision: dressing C/D/I No cellulitis present Compartment soft  LABS Recent Labs    03/03/19 0427 03/04/19 0327  HGB 9.4* 9.8*  HCT 28.1* 29.1*  WBC 14.4* 15.1*  PLT 277 265    Recent Labs    03/03/19 0427 03/04/19 0327  NA 138 137  K 4.7 4.1  BUN 25* 24*  CREATININE 0.92 0.91  GLUCOSE 129* 107*     Assessment/Plan: 2 Days Post-Op Procedure(s) (LRB): TOTAL KNEE ARTHROPLASTY (Left) Ace bandage removed Up with therapy Discharge home          Danae Orleans PA-C  [facility]  [place] [address], [address], [place], [place] [address] Phone: [phone] [email or web address] Facebook  [employer]

## 2019-03-09 NOTE — Discharge Summary (Signed)
Physician Discharge Summary  Patient ID: [name] MRN: [id number] DOB/AGE: December 02, 1945 74 y.o.  Admit date: 03/02/2019 Discharge date: 03/04/2019   Procedures:  Procedure(s) (LRB): TOTAL KNEE ARTHROPLASTY (Left)  Attending Physician:  Dr. Paralee Cancel   Admission Diagnoses:   Left knee primary OA / pain  Discharge Diagnoses:  Principal Problem:   Status post total left knee replacement Active Problems:   Overweight (BMI 25.0-29.9)  Past Medical History:  Diagnosis Date  . Allergies   . Bruises easily   . Complication of anesthesia   . Crohn's disease ([place])    GI Dr Beulah Gandy in [place], [place]   . Deviated septum   . GERD (gastroesophageal reflux disease)   . Headache    occ   . Hiatal hernia   . HTN (hypertension)    pcp Dr Margaretann Loveless  in [place], [place]   . Neuropathy    fingers,periodically   . Osteoarthritis   . Pancreatic insufficiency   . PONV (postoperative nausea and vomiting)   . Rheumatoid arthritis ([place])   . Scoliosis   . Squamous cell carcinoma of arm    left   . Whooping cough with pneumonia    2017    HPI:    [name], 74 y.o. female, has a history of pain and functional disability in the left knee due to arthritis and has failed non-surgical conservative treatments for greater than 12 weeks to include NSAID's and/or analgesics, corticosteriod injections, viscosupplementation injections and activity modification.  Onset of symptoms was gradual, starting >10 years ago with gradually worsening course since that time. The patient noted prior procedures on the knee to include  arthroplasty on the right knee July 28, 2018.  Patient currently rates pain in the left knee(s) at 9 out of 10 with activity. Patient has worsening of pain with activity and weight bearing, pain that interferes with activities of daily living, pain with passive range of motion, crepitus and joint swelling.  Patient has evidence of periarticular osteophytes  and joint space narrowing by imaging studies.  There is no active infection.  Risks, benefits and expectations were discussed with the patient.  Risks including but not limited to the risk of anesthesia, blood clots, nerve damage, blood vessel damage, failure of the prosthesis, infection and up to and including death.  Patient understand the risks, benefits and expectations and wishes to proceed with surgery.   PCP: Isaac Bliss, Rayford Halsted, MD   Discharged Condition: good  Hospital Course:  Patient underwent the above stated procedure on 03/02/2019. Patient tolerated the procedure well and brought to the recovery room in good condition and subsequently to the floor.  POD #1 BP: 114/73 ; Pulse: 103 ; Temp: 99.2 F (37.3 C) ; Resp: 18 Patient reports pain as mild, pain controlled.  No reported events with the night.  Dr. Alvan Dame discussed the procedure, findings and expectations moving forward. Dorsiflexion/plantar flexion intact, incision: dressing C/D/I, no cellulitis present and compartment soft.   LABS  Basename    HGB     9.4  HCT     28.1   POD #2  BP: 150/80 ; Pulse: 110 ; Temp: 98.2 F (36.8 C) ; Resp: 20 Patient reports pain as mild, pain controlled medication.  Patient had difficulty ambulating due to pain yesterday.  Patient states most of her pain is in the upper thigh area of the tourniquet.  We discussed slowly progressing as well as the bruising that may occur due  to the tourniquet.  Patient states she would like to go home and we will plan on this today. Patient be ready for discharge home. Dorsiflexion/plantar flexion intact, incision: dressing C/D/I, no cellulitis present and compartment soft.   LABS  Basename    HGB     9.8  HCT     29.1    Discharge Exam: General appearance: alert, cooperative and no distress Extremities: Homans sign is negative, no sign of DVT, no edema, redness or tenderness in the calves or thighs and no ulcers, gangrene or trophic  changes  Disposition: Home with follow up in 2 weeks   Follow-up Information    Paralee Cancel, MD. Schedule an appointment as soon as possible for a visit in 2 weeks.   Specialty: Orthopedic Surgery Contact information: [address] [place] [address] [phone]           Discharge Instructions    Call MD / Call 911   Complete by: As directed    If you experience chest pain or shortness of breath, CALL 911 and be transported to the hospital emergency room.  If you develope a fever above 101 F, pus (white drainage) or increased drainage or redness at the wound, or calf pain, call your surgeon's office.   Change dressing   Complete by: As directed    Maintain surgical dressing until follow up in the clinic. If the edges start to pull up, may reinforce with tape. If the dressing is no longer working, may remove and cover with gauze and tape, but must keep the area dry and clean.  Call with any questions or concerns.   Constipation Prevention   Complete by: As directed    Drink plenty of fluids.  Prune juice may be helpful.  You may use a stool softener, such as Colace (over the counter) 100 mg twice a day.  Use MiraLax (over the counter) for constipation as needed.   Diet - low sodium heart healthy   Complete by: As directed    Discharge instructions   Complete by: As directed    Maintain surgical dressing until follow up in the clinic. If the edges start to pull up, may reinforce with tape. If the dressing is no longer working, may remove and cover with gauze and tape, but must keep the area dry and clean.  Follow up in 2 weeks at [facility] Call with any questions or concerns.   Increase activity slowly as tolerated   Complete by: As directed    Weight bearing as tolerated with assist device (walker, cane, etc) as directed, use it as long as suggested by your surgeon or therapist, typically at least 4-6 weeks.   TED hose   Complete by: As directed    Use  stockings (TED hose) for 2 weeks on both leg(s).  You may remove them at night for sleeping.      Allergies as of 03/04/2019      Reactions   Adhesive [tape]    Redness and skin peeling   Aspirin    Intestinal Bleeding   Ciprofloxacin Nausea And Vomiting   Codeine Nausea And Vomiting   Demerol [meperidine Hcl] Nausea And Vomiting   Lactose Intolerance (gi) Other (See Comments)   Pt has Crohn's    Latex    Redness and skin peeling   Other    Nuts-itching in throat  Seeds-stomach issues with chrons    Septra [sulfamethoxazole-trimethoprim] Nausea Only  Medication List    STOP taking these medications   acetaminophen 500 MG tablet Commonly known as: TYLENOL   mesalamine 1.2 g EC tablet Commonly known as: LIALDA     TAKE these medications   amoxicillin 500 MG capsule Commonly known as: AMOXIL Take 500 mg by mouth 2 (two) times daily as needed (crohn's flare up).   CALCIUM + D PO Take 1 tablet by mouth 2 (two) times daily.   cetirizine 10 MG tablet Commonly known as: ZYRTEC Take 10 mg by mouth daily.   dicyclomine 20 MG tablet Commonly known as: BENTYL Take 1 tablet (20 mg total) by mouth 4 (four) times daily as needed for spasms. What changed:   when to take this  additional instructions   docusate sodium 100 MG capsule Commonly known as: Colace Take 1 capsule (100 mg total) by mouth 2 (two) times daily.   doxycycline 100 MG capsule Commonly known as: Vibramycin Take 1 capsule (100 mg total) by mouth 2 (two) times daily.   DULoxetine 60 MG capsule Commonly known as: CYMBALTA Take 60 mg by mouth 2 (two) times daily.   ferrous sulfate 325 (65 FE) MG tablet Commonly known as: FerrouSul Take 1 tablet (325 mg total) by mouth 3 (three) times daily with meals for 14 days.   HYDROcodone-acetaminophen 5-325 MG tablet Commonly known as: Norco Take 1-2 tablets by mouth every 4 (four) hours as needed for moderate pain or severe pain.    lipase/protease/amylase 12000 units Cpep capsule Commonly known as: CREON Take 2 capsules (24,000 Units total) by mouth 3 (three) times daily with meals. What changed: Another medication with the same name was changed. Make sure you understand how and when to take each.   Creon 24000-76000 units Cpep Generic drug: Pancrelipase (Lip-Prot-Amyl) Take 2 capsules with each meal (3 times a day) What changed:   how much to take  how to take this  when to take this  additional instructions   loperamide 2 MG capsule Commonly known as: IMODIUM Take 1 capsule (2 mg total) by mouth 3 (three) times daily as needed for diarrhea or loose stools.   losartan 100 MG tablet Commonly known as: COZAAR Take 1 tablet (100 mg total) by mouth daily.   methocarbamol 500 MG tablet Commonly known as: Robaxin Take 1 tablet (500 mg total) by mouth every 6 (six) hours as needed for muscle spasms.   multivitamin with minerals Tabs tablet Take 1 tablet by mouth daily.   NASAL CLEANSE RINSE MIX NA Place 1 Dose into the nose daily as needed (congestion).   omeprazole 40 MG capsule Commonly known as: PRILOSEC Take 1 capsule (40 mg total) by mouth 2 (two) times daily.   ondansetron 4 MG tablet Commonly known as: ZOFRAN Take 1 tablet (4 mg total) by mouth every 8 (eight) hours as needed for nausea or vomiting.   polyethylene glycol 17 g packet Commonly known as: MIRALAX / GLYCOLAX Take 17 g by mouth 2 (two) times daily.   rivaroxaban 10 MG Tabs tablet Commonly known as: Xarelto Take 1 tablet (10 mg total) by mouth daily.            Discharge Care Instructions  (From admission, onward)         Start     Ordered   03/03/19 0000  Change dressing    Comments: Maintain surgical dressing until follow up in the clinic. If the edges start to pull up, may reinforce with tape. If the dressing  is no longer working, may remove and cover with gauze and tape, but must keep the area dry and clean.  Call  with any questions or concerns.   03/03/19 8592           Signed: [place] Pugh. Wing Schoch   PA-C  03/09/2019, 8:19 AM

## 2019-04-08 ENCOUNTER — Encounter: Payer: Self-pay | Admitting: Internal Medicine

## 2019-04-08 ENCOUNTER — Other Ambulatory Visit: Payer: Self-pay

## 2019-04-08 ENCOUNTER — Ambulatory Visit (INDEPENDENT_AMBULATORY_CARE_PROVIDER_SITE_OTHER): Payer: Medicare Other | Admitting: Internal Medicine

## 2019-04-08 VITALS — BP 140/90 | HR 68 | Temp 97.7°F | Wt 130.0 lb

## 2019-04-08 DIAGNOSIS — I10 Essential (primary) hypertension: Secondary | ICD-10-CM

## 2019-04-08 DIAGNOSIS — F339 Major depressive disorder, recurrent, unspecified: Secondary | ICD-10-CM | POA: Diagnosis not present

## 2019-04-08 DIAGNOSIS — H6122 Impacted cerumen, left ear: Secondary | ICD-10-CM | POA: Diagnosis not present

## 2019-04-08 MED ORDER — DULOXETINE HCL 60 MG PO CPEP: 60.0000 mg | ORAL_CAPSULE | Freq: Two times a day (BID) | ORAL | 1 refills | Status: DC

## 2019-04-08 NOTE — Progress Notes (Signed)
Established Patient Office Visit     This visit occurred during the SARS-CoV-2 public health emergency.  Safety protocols were in place, including screening questions prior to the visit, additional usage of staff PPE, and extensive cleaning of exam room while observing appropriate contact time as indicated for disinfecting solutions.    CC/Reason for Visit: Left ear fullness and pain, blood pressure follow-up  HPI: [name] is a 74 y.o. female who is coming in today for the above mentioned reasons.  She has been having left ear fullness and decreased hearing for about a week.  Denies pain, fever, any other URI symptoms.  She is also here for follow-up on blood pressure after she was started on losartan at last visit.  Since I last saw her, she had a left knee replacement.  Her right knee had been replaced previously.   Past Medical/Surgical History: Past Medical History:  Diagnosis Date  . Allergies   . Bruises easily   . Complication of anesthesia   . Crohn's disease ([place])    GI Dr Beulah Gandy in [place], [place]   . Deviated septum   . GERD (gastroesophageal reflux disease)   . Headache    occ   . Hiatal hernia   . HTN (hypertension)    pcp Dr Margaretann Loveless  in [place], [place]   . Neuropathy    fingers,periodically   . Osteoarthritis   . Pancreatic insufficiency   . PONV (postoperative nausea and vomiting)   . Rheumatoid arthritis ([place])   . Scoliosis   . Squamous cell carcinoma of arm    left   . Whooping cough with pneumonia    2017    Past Surgical History:  Procedure Laterality Date  . CATARACT EXTRACTION, BILATERAL  2018   with IOC implant   . COLONOSCOPY    . DEBRIDEMENT AND CLOSURE WOUND Right 10/01/2018   Procedure: Excision of right knee wound with placement of ACell and Pravena;  Surgeon: Wallace Going, DO;  Location: [facility];  Service: Plastics;  Laterality: Right;  30 min  . JOINT REPLACEMENT  2020   right  knee  . knee fracture     . NASAL FRACTURE SURGERY     needed b/c she was getting frequent sinus infections   . 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 TOTAL KNEE ARTHROPLASTY Left 03/02/2019   Procedure: TOTAL KNEE ARTHROPLASTY;  Surgeon: Paralee Cancel, MD;  Location[facility];  Service: Orthopedics;  Laterality: Left;  70 mins  . UPPER GASTROINTESTINAL ENDOSCOPY      Social History:  reports that she quit smoking about 31 years ago. Her smoking use included cigarettes. She quit after 30.00 years of use. She has never used smokeless tobacco. She reports previous alcohol use. She reports that she does not use drugs.  Allergies: Allergies  Allergen Reactions  . Adhesive [Tape]     Redness and skin peeling  . Aspirin     Intestinal Bleeding  . Ciprofloxacin Nausea And Vomiting  . Codeine Nausea And Vomiting  . Demerol [Meperidine Hcl] Nausea And Vomiting  . Lactose Intolerance (Gi) Other (See Comments)    Pt has Crohn's   . Latex     Redness and skin peeling  . Other     Nuts-itching in throat  Seeds-stomach issues with chrons    .  Septra [Sulfamethoxazole-Trimethoprim] Nausea Only    Family History:  Family History  Problem Relation Age of Onset  . Diabetes Father      Current Outpatient Medications:  .  amoxicillin (AMOXIL) 500 MG capsule, Take 500 mg by mouth 2 (two) times daily as needed (crohn's flare up). , Disp: , Rfl:  .  Calcium Citrate-Vitamin D (CALCIUM + D PO), Take 1 tablet by mouth 2 (two) times daily., Disp: , Rfl:  .  cetirizine (ZYRTEC) 10 MG tablet, Take 10 mg by mouth daily., Disp: , Rfl:  .  dicyclomine (BENTYL) 20 MG tablet, Take 1 tablet (20 mg total) by mouth 4 (four) times daily as needed for spasms. (Patient taking differently: Take 20 mg by mouth See admin instructions. Take 20 mg by mouth twice daily, may take an  additional 2 doses of 20 mg throughout the day as needed for cramps), Disp: 360 tablet, Rfl: 1 .  docusate sodium (COLACE) 100 MG capsule, Take 1 capsule (100 mg total) by mouth 2 (two) times daily., Disp: 28 capsule, Rfl: 0 .  doxycycline (VIBRAMYCIN) 100 MG capsule, Take 1 capsule (100 mg total) by mouth 2 (two) times daily., Disp: 28 capsule, Rfl: 0 .  DULoxetine (CYMBALTA) 60 MG capsule, Take 1 capsule (60 mg total) by mouth 2 (two) times daily., Disp: 180 capsule, Rfl: 1 .  HYDROcodone-acetaminophen (NORCO) 5-325 MG tablet, Take 1-2 tablets by mouth every 4 (four) hours as needed for moderate pain or severe pain., Disp: 60 tablet, Rfl: 0 .  lipase/protease/amylase (CREON) 12000 units CPEP capsule, Take 2 capsules (24,000 Units total) by mouth 3 (three) times daily with meals., Disp: 540 capsule, Rfl: 1 .  loperamide (IMODIUM) 2 MG capsule, Take 1 capsule (2 mg total) by mouth 3 (three) times daily as needed for diarrhea or loose stools., Disp: 90 capsule, Rfl: 2 .  losartan (COZAAR) 100 MG tablet, Take 1 tablet (100 mg total) by mouth daily., Disp: 90 tablet, Rfl: 1 .  methocarbamol (ROBAXIN) 500 MG tablet, Take 1 tablet (500 mg total) by mouth every 6 (six) hours as needed for muscle spasms., Disp: 40 tablet, Rfl: 0 .  Misc Natural Product Nasal (NASAL CLEANSE RINSE MIX NA), Place 1 Dose into the nose daily as needed (congestion)., Disp: , Rfl:  .  Multiple Vitamin (MULTIVITAMIN WITH MINERALS) TABS tablet, Take 1 tablet by mouth daily., Disp: , Rfl:  .  omeprazole (PRILOSEC) 40 MG capsule, Take 1 capsule (40 mg total) by mouth 2 (two) times daily., Disp: 180 capsule, Rfl: 3 .  ondansetron (ZOFRAN) 4 MG tablet, Take 1 tablet (4 mg total) by mouth every 8 (eight) hours as needed for nausea or vomiting., Disp: 90 tablet, Rfl: 1 .  Pancrelipase, Lip-Prot-Amyl, (CREON) 24000-76000 units CPEP, Take 2 capsules with each meal (3 times a day) (Patient taking differently: Take 2 capsules by mouth 3 (three)  times daily with meals. ), Disp: 180 capsule, Rfl: 3 .  polyethylene glycol (MIRALAX / GLYCOLAX) 17 g packet, Take 17 g by mouth 2 (two) times daily., Disp: 28 packet, Rfl: 0 .  rivaroxaban (XARELTO) 10 MG TABS tablet, Take 1 tablet (10 mg total) by mouth daily., Disp: 14 tablet, Rfl: 0 .  ferrous sulfate (FERROUSUL) 325 (65 FE) MG tablet, Take 1 tablet (325 mg total) by mouth 3 (three) times daily with meals for 14 days., Disp: 42 tablet, Rfl: 0  Review of Systems:  Constitutional: Denies fever, chills, diaphoresis, appetite change and fatigue.  HEENT:  Denies photophobia, eye pain, redness,  congestion, sore throat, rhinorrhea, sneezing, mouth sores, trouble swallowing, neck pain, neck stiffness and tinnitus.   Respiratory: Denies SOB, DOE, cough, chest tightness,  and wheezing.   Cardiovascular: Denies chest pain, palpitations and leg swelling.  Gastrointestinal: Denies nausea, vomiting, abdominal pain, diarrhea, constipation, blood in stool and abdominal distention.  Genitourinary: Denies dysuria, urgency, frequency, hematuria, flank pain and difficulty urinating.  Endocrine: Denies: hot or cold intolerance, sweats, changes in hair or nails, polyuria, polydipsia. Musculoskeletal: Denies myalgias, back pain, joint swelling, arthralgias and gait problem.  Skin: Denies pallor, rash and wound.  Neurological: Denies dizziness, seizures, syncope, weakness, light-headedness, numbness and headaches.  Hematological: Denies adenopathy. Easy bruising, personal or family bleeding history  Psychiatric/Behavioral: Denies suicidal ideation, mood changes, confusion, nervousness, sleep disturbance and agitation    Physical Exam: Vitals:   04/08/19 1055  BP: 140/90  Pulse: 68  Temp: 97.7 F (36.5 C)  TempSrc: Temporal  SpO2: 96%  Weight: 130 lb (59 kg)    Body mass index is 25.39 kg/m.   Constitutional: NAD, calm, comfortable Eyes: PERRL, lids and conjunctivae normal, wears corrective  lenses ENMT: Mucous membranes are moist.Tympanic membrane is pearly white, no erythema or bulging on the right, left is obstructed by cerumen. Respiratory: clear to auscultation bilaterally, no wheezing, no crackles. Normal respiratory effort. No accessory muscle use.  Cardiovascular: Regular rate and rhythm, no murmurs / rubs / gallops. No extremity edema.  Neurologic: Grossly intact and nonfocal Psychiatric: Normal judgment and insight. Alert and oriented x 3. Normal mood.    Impression and Plan:  Left ear impacted cerumen -Cerumen Desimpaction  After obtaining patient consent, warm water was applied and gentle ear lavage performed on the left ear. There were no complications and following the desimpaction the tympanic membranes were visible. Tympanic membranes are intact following the procedure. Auditory canals are normal. The patient reported relief of symptoms after removal of cerumen.   Essential hypertension -Blood pressure remains above goal, she is hesitant to augment antihypertensive therapy. -I have advised ambulatory blood pressure measurements and to return in 6 to 8 weeks for follow-up.  Depression, recurrent ([place])  - Plan: DULoxetine (CYMBALTA) 60 MG capsule    Patient Instructions  -Nice seeing you today!!  -Check blood pressure at home 3 times per week and call us once you have 3 weeks worth of data.  -Schedule follow up in 8 weeks for blood pressure management.     Lelon Frohlich, MD [facility]

## 2019-04-08 NOTE — Patient Instructions (Signed)
-  Nice seeing you today!!  -Check blood pressure at home 3 times per week and call us once you have 3 weeks worth of data.  -Schedule follow up in 8 weeks for blood pressure management.

## 2019-04-12 ENCOUNTER — Telehealth: Payer: Self-pay | Admitting: Internal Medicine

## 2019-04-12 DIAGNOSIS — K449 Diaphragmatic hernia without obstruction or gangrene: Secondary | ICD-10-CM

## 2019-04-12 DIAGNOSIS — R05 Cough: Secondary | ICD-10-CM

## 2019-04-12 DIAGNOSIS — R059 Cough, unspecified: Secondary | ICD-10-CM

## 2019-04-12 NOTE — Telephone Encounter (Signed)
Pt would like a referral to an ENT and Pulmonologist. She stated she would like to know the best possible thing for her hernia and that's why she wants the referrals and understands that they are different doctors.   Pt can be reached at [phone]  -she would like to know if referrals will be placed or not

## 2019-04-13 ENCOUNTER — Telehealth: Payer: Self-pay | Admitting: Internal Medicine

## 2019-04-13 DIAGNOSIS — K219 Gastro-esophageal reflux disease without esophagitis: Secondary | ICD-10-CM

## 2019-04-13 MED ORDER — OMEPRAZOLE 40 MG PO CPDR: 40.0000 mg | DELAYED_RELEASE_CAPSULE | Freq: Two times a day (BID) | ORAL | 1 refills | Status: DC

## 2019-04-13 NOTE — Addendum Note (Signed)
Addended by: Westley Hummer B on: 04/13/2019 11:10 AM   Modules accepted: Orders

## 2019-04-13 NOTE — Telephone Encounter (Signed)
Referrals placed 

## 2019-04-13 NOTE — Telephone Encounter (Signed)
Pt is requesting a refill on Omeprazole(Prilosec) 40 mg capsule. Pt uses [facility]. Thanks

## 2019-04-13 NOTE — Telephone Encounter (Signed)
OK to place referrals, altho I am not sure how these specialists will be able to help her make a decision regarding surgery.Marland KitchenMarland KitchenMarland Kitchen

## 2019-04-20 NOTE — Telephone Encounter (Signed)
Pt received a call from pulmonary but has not received a call from ENT. Pt wants to be sure that she will still send a referral to ENT.   Pt states she woke up with a bad case of vertigo and wants to see a specialist   Pt can be reached at [phone]

## 2019-04-20 NOTE — Telephone Encounter (Signed)
Information about ENT referral given to patient

## 2019-04-23 ENCOUNTER — Other Ambulatory Visit: Payer: Self-pay | Admitting: Internal Medicine

## 2019-04-23 DIAGNOSIS — I10 Essential (primary) hypertension: Secondary | ICD-10-CM

## 2019-04-23 MED ORDER — LOPERAMIDE HCL 2 MG PO CAPS: 2.0000 mg | ORAL_CAPSULE | Freq: Three times a day (TID) | ORAL | 2 refills | Status: DC | PRN

## 2019-04-23 MED ORDER — LOSARTAN POTASSIUM 100 MG PO TABS: 100.0000 mg | ORAL_TABLET | Freq: Every day | ORAL | 1 refills | Status: DC

## 2019-04-23 NOTE — Telephone Encounter (Signed)
losartan (COZAAR) 100 MG tablet  [id number] [place] [address]  [phone] Fax:  [phone]

## 2019-04-23 NOTE — Telephone Encounter (Signed)
Refilled Imodium

## 2019-04-23 NOTE — Addendum Note (Signed)
Addended by: Julieanne Cotton K on: 04/23/2019 11:29 AM   Modules accepted: Orders

## 2019-04-27 ENCOUNTER — Other Ambulatory Visit: Payer: Self-pay

## 2019-04-27 ENCOUNTER — Ambulatory Visit (INDEPENDENT_AMBULATORY_CARE_PROVIDER_SITE_OTHER): Payer: Medicare Other | Admitting: Otolaryngology

## 2019-04-27 DIAGNOSIS — H6122 Impacted cerumen, left ear: Secondary | ICD-10-CM | POA: Diagnosis not present

## 2019-04-27 DIAGNOSIS — J310 Chronic rhinitis: Secondary | ICD-10-CM | POA: Diagnosis not present

## 2019-04-27 DIAGNOSIS — H6982 Other specified disorders of Eustachian tube, left ear: Secondary | ICD-10-CM

## 2019-04-27 NOTE — Progress Notes (Signed)
HPI: [name] is a 74 y.o. female who presents for evaluation of multiple complaints.  She has had a long intermittent problem with her left ear.  She never has problems with the right ear but she has had history of fluid buildup in the left ear previously and had previous myringotomy before she moved to [place].  She is not interested in having a tube placed in the ear.  She has had longstanding hearing loss and recommended hearing aids but did not want to use hearing aids.  She has not had a hearing test performed in over a year. She also recently a couple weeks ago had a bad episode of vertigo that lasted for well over an hour and eventually cleared up.  She has not noted any significant change in her hearing since that time although she is always had more problems with the left ear. She is contemplating surgery with Dr. Lucia Gaskins concerning repair of a hiatal hernia. She has had previous history of Crohn's disease as well as GI problems and has had her esophagus stretched with gastroenterology. She has also had sinus issues where she notices sinus drainage.  She had previous sinus surgery performed in [place] prior to moving to [place].  She mostly just complains of nasal drainage which is generally clear. She uses Flonase regularly and also uses NeilMed sinus rinse.  Past Medical History:  Diagnosis Date  . Allergies   . Bruises easily   . Complication of anesthesia   . Crohn's disease ([place])    GI Dr Beulah Gandy in [place], [place]   . Deviated septum   . GERD (gastroesophageal reflux disease)   . Headache    occ   . Hiatal hernia   . HTN (hypertension)    pcp Dr Margaretann Loveless  in [place], [place]   . Neuropathy    fingers,periodically   . Osteoarthritis   . Pancreatic insufficiency   . PONV (postoperative nausea and vomiting)   . Rheumatoid arthritis ([place])   . Scoliosis   . Squamous cell carcinoma of arm    left   . Whooping cough with pneumonia    2017    Past Surgical History:  Procedure Laterality Date  . CATARACT EXTRACTION, BILATERAL  2018   with IOC implant   . COLONOSCOPY    . DEBRIDEMENT AND CLOSURE WOUND Right 10/01/2018   Procedure: Excision of right knee wound with placement of ACell and Pravena;  Surgeon: Wallace Going, DO;  Location: [facility];  Service: Plastics;  Laterality: Right;  30 min  . JOINT REPLACEMENT  2020   right knee  . knee fracture     . NASAL FRACTURE SURGERY     needed b/c she was getting frequent sinus infections   . 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 TOTAL KNEE ARTHROPLASTY Left 03/02/2019   Procedure: TOTAL KNEE ARTHROPLASTY;  Surgeon: Paralee Cancel, MD;  [facility];  Service: Orthopedics;  Laterality: Left;  70 mins  . UPPER GASTROINTESTINAL ENDOSCOPY     Social History   Socioeconomic History  . Marital status: Married    Spouse name: Not on file  . Number of children: Not on file  . Years of education: Not on file  . Highest education level: Not on file  Occupational History  . Not on file  Tobacco Use  .  Smoking status: Former Smoker    Years: 30.00    Types: Cigarettes    Quit date: 1990    Years since quitting: 31.3  . Smokeless tobacco: Never Used  Substance and Sexual Activity  . Alcohol use: Not Currently  . Drug use: Never  . Sexual activity: Not on file  Other Topics Concern  . Not on file  Social History Narrative  . Not on file   Social Determinants of Health   Financial Resource Strain:   . Difficulty of Paying Living Expenses:   Food Insecurity:   . Worried About Charity fundraiser in the Last Year:   . Arboriculturist in the Last Year:   Transportation Needs:   . Film/video editor (Medical):   Marland Kitchen Lack of Transportation (Non-Medical):   Physical Activity:   . Days of Exercise per Week:   .  Minutes of Exercise per Session:   Stress:   . Feeling of Stress :   Social Connections:   . Frequency of Communication with Friends and Family:   . Frequency of Social Gatherings with Friends and Family:   . Attends Religious Services:   . Active Member of Clubs or Organizations:   . Attends Archivist Meetings:   Marland Kitchen Marital Status:    Family History  Problem Relation Age of Onset  . Diabetes Father    Allergies  Allergen Reactions  . Adhesive [Tape]     Redness and skin peeling  . Aspirin     Intestinal Bleeding  . Ciprofloxacin Nausea And Vomiting  . Codeine Nausea And Vomiting  . Demerol [Meperidine Hcl] Nausea And Vomiting  . Lactose Intolerance (Gi) Other (See Comments)    Pt has Crohn's   . Latex     Redness and skin peeling  . Other     Nuts-itching in throat  Seeds-stomach issues with chrons    . Septra [Sulfamethoxazole-Trimethoprim] Nausea Only   Prior to Admission medications   Medication Sig Start Date End Date Taking? Authorizing Provider  amoxicillin (AMOXIL) 500 MG capsule Take 500 mg by mouth 2 (two) times daily as needed (crohn's flare up).     [provider]  Calcium Citrate-Vitamin D (CALCIUM + D PO) Take 1 tablet by mouth 2 (two) times daily.    [provider]  cetirizine (ZYRTEC) 10 MG tablet Take 10 mg by mouth daily.    [provider]  dicyclomine (BENTYL) 20 MG tablet Take 1 tablet (20 mg total) by mouth 4 (four) times daily as needed for spasms. Patient taking differently: Take 20 mg by mouth See admin instructions. Take 20 mg by mouth twice daily, may take an additional 2 doses of 20 mg throughout the day as needed for cramps 10/08/18   Irene Shipper, MD  docusate sodium (COLACE) 100 MG capsule Take 1 capsule (100 mg total) by mouth 2 (two) times daily. 03/03/19   Danae Orleans, PA-C  doxycycline (VIBRAMYCIN) 100 MG capsule Take 1 capsule (100 mg total) by mouth 2 (two) times daily. 03/03/19   Danae Orleans,  PA-C  DULoxetine (CYMBALTA) 60 MG capsule Take 1 capsule (60 mg total) by mouth 2 (two) times daily. 04/08/19   Isaac Bliss, Rayford Halsted, MD  ferrous sulfate (FERROUSUL) 325 (65 FE) MG tablet Take 1 tablet (325 mg total) by mouth 3 (three) times daily with meals for 14 days. 03/03/19 03/17/19  Danae Orleans, PA-C  HYDROcodone-acetaminophen (NORCO) 5-325 MG tablet Take 1-2 tablets  by mouth every 4 (four) hours as needed for moderate pain or severe pain. 03/03/19   Danae Orleans, PA-C  lipase/protease/amylase (CREON) 12000 units CPEP capsule Take 2 capsules (24,000 Units total) by mouth 3 (three) times daily with meals. 10/08/18   Irene Shipper, MD  loperamide (IMODIUM) 2 MG capsule Take 1 capsule (2 mg total) by mouth 3 (three) times daily as needed for diarrhea or loose stools. 04/23/19   Irene Shipper, MD  losartan (COZAAR) 100 MG tablet Take 1 tablet (100 mg total) by mouth daily. 04/23/19   Isaac Bliss, Rayford Halsted, MD  methocarbamol (ROBAXIN) 500 MG tablet Take 1 tablet (500 mg total) by mouth every 6 (six) hours as needed for muscle spasms. 03/03/19   Danae Orleans, PA-C  Misc Natural Product Nasal (NASAL CLEANSE RINSE MIX NA) Place 1 Dose into the nose daily as needed (congestion).    [provider]  Multiple Vitamin (MULTIVITAMIN WITH MINERALS) TABS tablet Take 1 tablet by mouth daily.    [provider]  omeprazole (PRILOSEC) 40 MG capsule Take 1 capsule (40 mg total) by mouth 2 (two) times daily. 04/13/19   Isaac Bliss, Rayford Halsted, MD  ondansetron (ZOFRAN) 4 MG tablet Take 1 tablet (4 mg total) by mouth every 8 (eight) hours as needed for nausea or vomiting. 10/08/18   Irene Shipper, MD  Pancrelipase, Lip-Prot-Amyl, (CREON) 24000-76000 units CPEP Take 2 capsules with each meal (3 times a day) Patient taking differently: Take 2 capsules by mouth 3 (three) times daily with meals.  10/15/18   Irene Shipper, MD  polyethylene glycol (MIRALAX / GLYCOLAX) 17 g packet Take 17 g by  mouth 2 (two) times daily. 03/03/19   Danae Orleans, PA-C  rivaroxaban (XARELTO) 10 MG TABS tablet Take 1 tablet (10 mg total) by mouth daily. 03/03/19   Danae Orleans, PA-C     Positive ROS: Otherwise negative  All other systems have been reviewed and were otherwise negative with the exception of those mentioned in the HPI and as above.  Physical Exam: Constitutional: Alert, well-appearing, no acute distress Ears: External ears without lesions or tenderness.  She has wax adjacent to the left TM that was removed in the office with a pick and forceps.  She has little scar region inferiorly along the TM where she probably had previous myringotomy.  She has a minimal left serous otitis but was able to insufflate air behind the left TM.  On tuning fork testing she heard a little bit better on the right side versus left but minimal difference and AC > BC bilaterally. Nasal: External nose without lesions. Septum midline.  Both middle meatus regions are widely patent from previous surgery.  No signs of infection.  Mild rhinitis.. Clear nasal passages Oral: Lips and gums without lesions. Tongue and palate mucosa without lesions. Posterior oropharynx clear. Indirect laryngoscopy revealed clear base of tongue epiglottis and vocal cords.  Piriform sinuses were clear. Neck: No palpable adenopathy or masses.  No palpable thyroid masses. Respiratory: Breathing comfortably  Skin: No facial/neck lesions or rash noted.  Cerumen impaction removal  Date/Time: 04/27/2019 5:11 PM Performed by: Rozetta Nunnery, MD Authorized by: Rozetta Nunnery, MD   Consent:    Consent obtained:  Verbal   Consent given by:  Patient   Risks discussed:  Pain and bleeding Procedure details:    Location:  L ear   Procedure type: forceps   Post-procedure details:    Inspection:  TM intact  and canal normal   Hearing quality:  Improved   Patient tolerance of procedure:  Tolerated well, no immediate  complications Comments:     Patient with a minimal left serous otitis with clear right TM.    Assessment: Minimal left serous otitis with no signs of acute infection. Longstanding history of left eustachian tube dysfunction. No signs of infection. Mild chronic rhinitis with no signs of active infection  Plan: For her nasal sinus issues recommended regular use of Flonase 2 sprays each nostril at night.  She is presently already using this.  Also discussed use of saline nasal irrigation which will help with postnasal drainage and suggested trying Xlear brand. Briefly discussed the option of placing a myringotomy tube in the left ear since she has had chronic intermittent problems with this ear but she really does not want to have a tube placed in the left ear.  The minimal serous effusion is causing minimal hearing problems presently as AC is greater than BC. She will follow-up as needed  Radene Journey, MD

## 2019-05-05 ENCOUNTER — Ambulatory Visit (INDEPENDENT_AMBULATORY_CARE_PROVIDER_SITE_OTHER): Payer: Medicare Other | Admitting: Internal Medicine

## 2019-05-05 ENCOUNTER — Encounter: Payer: Self-pay | Admitting: Internal Medicine

## 2019-05-05 ENCOUNTER — Other Ambulatory Visit: Payer: Self-pay

## 2019-05-05 VITALS — BP 160/90 | HR 93 | Temp 98.0°F | Ht 60.0 in | Wt 132.4 lb

## 2019-05-05 DIAGNOSIS — Z87891 Personal history of nicotine dependence: Secondary | ICD-10-CM

## 2019-05-05 DIAGNOSIS — R05 Cough: Secondary | ICD-10-CM

## 2019-05-05 DIAGNOSIS — R053 Chronic cough: Secondary | ICD-10-CM

## 2019-05-05 DIAGNOSIS — K449 Diaphragmatic hernia without obstruction or gangrene: Secondary | ICD-10-CM | POA: Diagnosis not present

## 2019-05-05 DIAGNOSIS — J301 Allergic rhinitis due to pollen: Secondary | ICD-10-CM

## 2019-05-05 NOTE — Progress Notes (Signed)
[name]    [id number]    12/29/1945  Primary Care Physician:Hernandez Everardo Beals, MD  Referring Physician: Isaac Bliss, Rayford Halsted, MD [place],  [place] [address] Reason for Consultation: chronic cough Date of Consultation: 05/05/2019  Chief complaint:   Chief Complaint  Patient presents with  . Consult    cough for years, never goes away, 2015 whooping cough and pna after whooping.  cough improved.  coughing with eating.,  Has a hiatal hernia.  Has been to the surgeon, wants to make sure she needs it.     HPI: [name] is a 74 y.o. woman who has been coughing for decades. She had an episode of whooping cough in 2015 with subsequent pneumonia.   Cough is all day and has woken her up at night. She does have a large hiatal hernia and does have frank reflux.  She takes prilosec twice a day.  She has Crohn's in remission (on mesalamine) and has IBS. She takes Creon as well.   She is occasionally short of breath but is able to complete all her daily activities.  Has rhinitis and takes zyrtec OTC. Continues to have rhinitis symptoms through this.   Sees. Dr. Henrene Pastor in GI and has also followed up with surgery  Recent bilateral TKA - no issues with anesthesia. Doing well.   She says she had pulmonary function testing and passed with flying colors in [place].   She is having post prandial pain.  Social history:  Occupation: has worked with [employer]. Originally from [place].  Exposures: lives at home with her husband, no pets.  Smoking history: 30 pack years, quit in 1990. No passive smoke exposure.   Social History   Occupational History  . Not on file  Tobacco Use  . Smoking status: Former Smoker    Packs/day: 1.00    Years: 30.00    Pack years: 30.00    Types: Cigarettes    Quit date: 1990    Years since quitting: 31.3  . Smokeless tobacco: Never Used  Substance and Sexual Activity  .  Alcohol use: Not Currently  . Drug use: Never  . Sexual activity: Not on file    Relevant family history: Family History  Problem Relation Age of Onset  . Diabetes Father   . Asthma Neg Hx     Past Medical History:  Diagnosis Date  . Allergies   . Bruises easily   . Complication of anesthesia   . Crohn's disease ([place])    GI Dr Beulah Gandy in [place], [place]   . Deviated septum   . GERD (gastroesophageal reflux disease)   . Headache    occ   . Hiatal hernia   . HTN (hypertension)    pcp Dr Margaretann Loveless  in [place], [place]   . Neuropathy    fingers,periodically   . Osteoarthritis   . Pancreatic insufficiency   . PONV (postoperative nausea and vomiting)   . Rheumatoid arthritis ([place])   . Scoliosis   . Squamous cell carcinoma of arm    left   . Whooping cough 2015  . Whooping cough with pneumonia    2017    Past Surgical History:  Procedure Laterality Date  . CATARACT EXTRACTION, BILATERAL  2018   with IOC implant   . COLONOSCOPY    . DEBRIDEMENT AND CLOSURE WOUND Right 10/01/2018   Procedure: Excision of right  knee wound with placement of ACell and Pravena;  Surgeon: Wallace Going, DO;  Location: [facility];  Service: Plastics;  Laterality: Right;  30 min  . JOINT REPLACEMENT  2020   right knee  . knee fracture     . NASAL FRACTURE SURGERY     needed b/c she was getting frequent sinus infections   . 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 TOTAL KNEE ARTHROPLASTY Left 03/02/2019   Procedure: TOTAL KNEE ARTHROPLASTY;  Surgeon: Paralee Cancel, MD;  Location: [facility];  Service: Orthopedics;  Laterality: Left;  70 mins  . UPPER GASTROINTESTINAL ENDOSCOPY       Review of systems: Review of Systems  Constitutional: Negative for chills, fever and weight loss.  HENT: Positive for congestion, sinus pain and  sore throat.   Eyes: Negative for discharge and redness.  Respiratory: Positive for cough and shortness of breath. Negative for hemoptysis, sputum production and wheezing.   Cardiovascular: Negative for chest pain, palpitations and leg swelling.  Gastrointestinal: Positive for heartburn. Negative for nausea and vomiting.  Musculoskeletal: Negative for joint pain and myalgias.  Skin: Negative for rash.  Neurological: Negative for dizziness, tremors, focal weakness and headaches.  Endo/Heme/Allergies: Positive for environmental allergies.  Psychiatric/Behavioral: Negative for depression. The patient is not nervous/anxious.   All other systems reviewed and are negative.   Physical Exam: Blood pressure (!) 160/90, pulse 93, temperature 98 F (36.7 C), temperature source Temporal, height 5' (1.524 m), weight 132 lb 6.4 oz (60.1 kg), SpO2 93 %. Gen:      No acute distress ENT:   Mild nasal debris, +cobblestoning in oropharynx Lungs:   clear to auscultation bilaterally, no wheezes or crackles, marked dextroscoliosis CV:         Regular rate and rhythm; no murmurs, rubs, or gallops.  No pedal edema Abd:      + bowel sounds; soft, non-tender; no distension MSK: no acute synovitis of DIP or PIP joints, no mechanics hands.  Skin:      Warm and dry; dry skin Neuro: normal speech, no focal facial asymmetry Psych: alert and oriented x3, normal mood and affect   Data Reviewed/Medical Decision Making:  Independent interpretation of tests: Imaging: . Review of patient's chest xray  images revealed large hiatal hernia with protrusion of stomach in the posterior mediastinum. No pleural effusion or pneumonia. Marked scoliosis. The patient's images have been independently reviewed by me.    PFTs: None on file.   Labs:  Lab Results  Component Value Date   WBC 15.1 (H) 03/04/2019   HGB 9.8 (L) 03/04/2019   HCT 29.1 (L) 03/04/2019   MCV 90.7 03/04/2019   PLT 265 03/04/2019     Immunization  status:  Immunization History  Administered Date(s) Administered  . Moderna SARS-COVID-2 Vaccination 03/29/2019, 04/26/2019    . I reviewed prior external note(s) from GI, Surgery . I reviewed the result(s) of the labs and imaging as noted above.   Assessment:  Chronic Cough Shortness of Breath GERD Allergic Rhinitis  Plan/Recommendations: [name] is a 74 year old woman who presents primarily for evaluation of cough and shortness of breath.  Her main concern is whether or not she go through with surgery for her hiatal hernia.  She has been recommended to have repair by her gastroenterologist as well as a Education officer, environmental.  She is now having  significant postprandial pain.  She wants to know for shortness of breath and cough will improve with the surgery which I am uncertain.  I do think that there is at least some component of rhinitis that is contributing to her cough and may be making her reflux as well as hernia formation worse.  I certainly do not think that her reflux and hernia are going to improve spontaneously, but that I advised her to speak with her gastroenterologist and surgeon regarding the success of hiatal hernia repair on the symptoms.  Regarding her shortness of breath suspect there could be a multifactorial etiology based on her history of tobacco use disorder, her scoliosis, and some compression from her hiatal hernia.  We discussed that pulmonary function testing would be the best way for me to determine this.  I encouraged her to take her albuterol as needed to see if that would help her symptoms.  She says that her pulmonary function testing was normal a couple of years ago.  I offered her follow-up as needed in case her breathing continues to be an issue we can avoid repeat testing obtain the records from her previous pulmonologist.  She will let us know.  Regarding her cough, she seems to think that it is resolved over the last couple of weeks.  She should continue  cetirizine, nasal saline rinses, as well as intranasal steroids for this.  If she has breakthrough rhinitis we can consider a trial of Singulair as well.  She will let us know if cough returns.  We discussed disease management and progression at length today.   I spent 45 minutes in the care of this patient today including pre-charting, chart review, review of results, face-to-face care, coordination of care and communication with consultants etc.).  [id number] 60-74 minutes or High level MDM   Return to Care: Return if symptoms worsen or fail to improve, for shortness of breath, cough. Lenice Llamas, MD [facility]  CC: Isaac Bliss, [place]*

## 2019-05-05 NOTE — Patient Instructions (Addendum)
Let us know if the cough worsens and we can try an additional medication for your allergies.    What is GERD? Gastroesophageal reflux disease (GERD) is gastroesophageal reflux diseasewhich occurs when the lower esophageal sphincter (LES) opens spontaneously, for varying periods of time, or does not close properly and stomach contents rise up into the esophagus. GER is also called acid reflux or acid regurgitation, because digestive juices-called acids-rise up with the food. The esophagus is the tube that carries food from the mouth to the stomach. The LES is a ring of muscle at the bottom of the esophagus that acts like a valve between the esophagus and stomach.  When acid reflux occurs, food or fluid can be tasted in the back of the mouth. When refluxed stomach acid touches the lining of the esophagus it may cause a burning sensation in the chest or throat called heartburn or acid indigestion. Occasional reflux is common. Persistent reflux that occurs more than twice a week is considered GERD, and it can eventually lead to more serious health problems. People of all ages can have GERD. Studies have shown that GERD may worsen or contribute to asthma, chronic cough, and pulmonary fibrosis.   What are the symptoms of GERD? The main symptom of GERD in adults is frequent heartburn, also called acid indigestion-burning-type pain in the lower part of the mid-chest, behind the breast bone, and in the mid-abdomen.  Not all reflux is acidic in nature, and many patients don't have heart burn at all. Sometimes it feels like a cough (either dry or with mucus), choking sensation, asthma, shortness of breath, waking up at night, frequent throat clearing, or trouble swallowing.    What causes GERD? The reason some people develop GERD is still unclear. However, research shows that in people with GERD, the LES relaxes while the rest of the esophagus is working. Anatomical abnormalities such as a hiatal hernia may also  contribute to GERD. A hiatal hernia occurs when the upper part of the stomach and the LES move above the diaphragm, the muscle wall that separates the stomach from the chest. Normally, the diaphragm helps the LES keep acid from rising up into the esophagus. When a hiatal hernia is present, acid reflux can occur more easily. A hiatal hernia can occur in people of any age and is most often a normal finding in otherwise healthy people over age 61. Most of the time, a hiatal hernia produces no symptoms.   Other factors that may contribute to GERD include - Obesity or recent weight gain - Pregnancy  - Smoking  - Diet - Certain medications  Common foods that can worsen reflux symptoms include: - carbonated beverages - artificial sweeteners - citrus fruits  - chocolate  - drinks with caffeine or alcohol  - fatty and fried foods  - garlic and onions  - mint flavorings  - spicy foods  - tomato-based foods, like spaghetti sauce, salsa, chili, and pizza   Lifestyle Changes If you smoke, stop.  Avoid foods and beverages that worsen symptoms (see above.) Lose weight if needed.  Eat small, frequent meals.  Wear loose-fitting clothes.  Avoid lying down for 3 hours after a meal.  Raise the head of your bed 6 to 8 inches by securing wood blocks under the bedposts. Just using extra pillows will not help, but using a wedge-shaped pillow may be helpful.  Medications  H2 blockers, such as cimetidine (Tagamet HB), famotidine (Pepcid AC), nizatidine (Axid AR), and ranitidine (Zantac 75),  decrease acid production. They are available in prescription strength and over-the-counter strength. These drugs provide short-term relief and are effective for about half of those who have GERD symptoms.  Proton pump inhibitors include omeprazole (Prilosec, Zegerid), lansoprazole (Prevacid), pantoprazole (Protonix), rabeprazole (Aciphex), and esomeprazole (Nexium), which are available by prescription. Prilosec is also  available in over-the-counter strength. Proton pump inhibitors are more effective than H2 blockers and can relieve symptoms and heal the esophageal lining in almost everyone who has GERD.  Because drugs work in different ways, combinations of medications may help control symptoms. People who get heartburn after eating may take both antacids and H2 blockers. The antacids work first to neutralize the acid in the stomach, and then the H2 blockers act on acid production. By the time the antacid stops working, the H2 blocker will have stopped acid production. Your health care provider is the best source of information about how to use medications for GERD.   Points to Remember 1. You can have GERD without having heartburn. Your symptoms could include a dry cough, asthma symptoms, or trouble swallowing.  2. Taking medications daily as prescribed is important in controlling you symptoms.  Sometimes it can take up to 8 weeks to fully achieve the effects of the medications prescribed.  3. Coughing related to GERD can be difficult to treat and is very frustrating!  However, it is important to stick with these medications and lifestyle modifications before pursuing more aggressive or invasive test and treatments.

## 2019-05-10 ENCOUNTER — Telehealth: Payer: Self-pay | Admitting: Internal Medicine

## 2019-05-10 MED ORDER — LOPERAMIDE HCL 2 MG PO CAPS: 2.0000 mg | ORAL_CAPSULE | Freq: Three times a day (TID) | ORAL | 2 refills | Status: DC | PRN

## 2019-05-10 NOTE — Telephone Encounter (Signed)
Requesting refill on Loperamide

## 2019-05-10 NOTE — Telephone Encounter (Signed)
Refilled Imodium

## 2019-06-06 ENCOUNTER — Other Ambulatory Visit: Payer: Self-pay | Admitting: Internal Medicine

## 2019-06-08 ENCOUNTER — Encounter: Payer: Self-pay | Admitting: Internal Medicine

## 2019-06-08 ENCOUNTER — Ambulatory Visit (INDEPENDENT_AMBULATORY_CARE_PROVIDER_SITE_OTHER): Payer: Medicare Other | Admitting: Internal Medicine

## 2019-06-08 ENCOUNTER — Other Ambulatory Visit: Payer: Self-pay

## 2019-06-08 VITALS — BP 110/78 | HR 53 | Temp 97.7°F | Wt 132.0 lb

## 2019-06-08 DIAGNOSIS — I10 Essential (primary) hypertension: Secondary | ICD-10-CM | POA: Diagnosis not present

## 2019-06-08 DIAGNOSIS — B3731 Acute candidiasis of vulva and vagina: Secondary | ICD-10-CM

## 2019-06-08 DIAGNOSIS — B373 Candidiasis of vulva and vagina: Secondary | ICD-10-CM

## 2019-06-08 DIAGNOSIS — H579 Unspecified disorder of eye and adnexa: Secondary | ICD-10-CM | POA: Diagnosis not present

## 2019-06-08 MED ORDER — FLUCONAZOLE 150 MG PO TABS: 150.0000 mg | ORAL_TABLET | Freq: Once | ORAL | 0 refills | Status: AC

## 2019-06-08 MED ORDER — FLUTICASONE PROPIONATE 50 MCG/ACT NA SUSP: 2.0000 | Freq: Every day | NASAL | 2 refills | Status: DC

## 2019-06-08 MED ORDER — LOSARTAN POTASSIUM 100 MG PO TABS: 100.0000 mg | ORAL_TABLET | Freq: Every day | ORAL | 1 refills | Status: DC

## 2019-06-08 NOTE — Progress Notes (Signed)
Established Patient Office Visit     This visit occurred during the SARS-CoV-2 public health emergency.  Safety protocols were in place, including screening questions prior to the visit, additional usage of staff PPE, and extensive cleaning of exam room while observing appropriate contact time as indicated for disinfecting solutions.    CC/Reason for Visit: Blood pressure follow-up, discuss some other acute concerns  HPI: [name] is a 74 y.o. female who is coming in today for the above mentioned reasons.  She is here today mainly to follow-up on her blood pressure.  She has been started on losartan 100 mg daily and was noticed to have elevated blood pressures last visit, she preferred to do ambulatory monitoring and return.  She brings in her home BP log that show systolics averaging in the 130s to low 150s over 70s to 80s.  Her blood pressure in office today is 110/78.  She has also been complaining of vaginal itching and a of sensation of pressure in both of her eyes.  She is not known to have glaucoma, has no eye pain or excessive tearing.  No vision changes.   Past Medical/Surgical History: Past Medical History:  Diagnosis Date  . Allergies   . Bruises easily   . Complication of anesthesia   . Crohn's disease ([place])    GI Dr Beulah Gandy in [place], [place]   . Deviated septum   . GERD (gastroesophageal reflux disease)   . Headache    occ   . Hiatal hernia   . HTN (hypertension)    pcp Dr Margaretann Loveless  in [place], [place]   . Neuropathy    fingers,periodically   . Osteoarthritis   . Pancreatic insufficiency   . PONV (postoperative nausea and vomiting)   . Rheumatoid arthritis ([place])   . Scoliosis   . Squamous cell carcinoma of arm    left   . Whooping cough 2015  . Whooping cough with pneumonia    2017    Past Surgical History:  Procedure Laterality Date  . CATARACT EXTRACTION, BILATERAL  2018   with IOC implant   . COLONOSCOPY    . DEBRIDEMENT  AND CLOSURE WOUND Right 10/01/2018   Procedure: Excision of right knee wound with placement of ACell and Pravena;  Surgeon: Wallace Going, DO;  Location: [facility];  Service: Plastics;  Laterality: Right;  30 min  . JOINT REPLACEMENT  2020   right knee  . knee fracture     . NASAL FRACTURE SURGERY     needed b/c she was getting frequent sinus infections   . 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 TOTAL KNEE ARTHROPLASTY Left 03/02/2019   Procedure: TOTAL KNEE ARTHROPLASTY;  Surgeon: Paralee Cancel, MD;  [facility];  Service: Orthopedics;  Laterality: Left;  70 mins  . UPPER GASTROINTESTINAL ENDOSCOPY      Social History:  reports that she quit smoking about 31 years ago. Her smoking use included cigarettes. She has a 30.00 pack-year smoking history. She has never used smokeless tobacco. She reports previous alcohol use. She reports that she does not use drugs.  Allergies: Allergies  Allergen Reactions  . Adhesive [Tape]     Redness and skin peeling  . Aspirin     Intestinal Bleeding  . Ciprofloxacin Nausea And Vomiting  . Codeine Nausea  And Vomiting  . Demerol [Meperidine Hcl] Nausea And Vomiting  . Lactose Intolerance (Gi) Other (See Comments)    Pt has Crohn's   . Latex     Redness and skin peeling  . Other     Nuts-itching in throat  Seeds-stomach issues with chrons    . Septra [Sulfamethoxazole-Trimethoprim] Nausea Only    Family History:  Family History  Problem Relation Age of Onset  . Diabetes Father   . Asthma Neg Hx      Current Outpatient Medications:  .  Calcium Citrate-Vitamin D (CALCIUM + D PO), Take 1 tablet by mouth 2 (two) times daily., Disp: , Rfl:  .  cetirizine (ZYRTEC) 10 MG tablet, Take 10 mg by mouth daily., Disp: , Rfl:  .  dicyclomine (BENTYL) 20 MG tablet, Take 1 tablet (20 mg total) by  mouth 4 (four) times daily as needed for spasms. (Patient taking differently: Take 20 mg by mouth See admin instructions. Take 20 mg by mouth twice daily, may take an additional 2 doses of 20 mg throughout the day as needed for cramps), Disp: 360 tablet, Rfl: 1 .  docusate sodium (COLACE) 100 MG capsule, Take 1 capsule (100 mg total) by mouth 2 (two) times daily., Disp: 28 capsule, Rfl: 0 .  DULoxetine (CYMBALTA) 60 MG capsule, Take 1 capsule (60 mg total) by mouth 2 (two) times daily., Disp: 180 capsule, Rfl: 1 .  fluticasone (FLONASE) 50 MCG/ACT nasal spray, Place 2 sprays into both nostrils daily., Disp: 16 g, Rfl: 2 .  lipase/protease/amylase (CREON) 12000 units CPEP capsule, Take 2 capsules (24,000 Units total) by mouth 3 (three) times daily with meals., Disp: 540 capsule, Rfl: 1 .  loperamide (IMODIUM) 2 MG capsule, Take 1 capsule (2 mg total) by mouth 3 (three) times daily as needed for diarrhea or loose stools., Disp: 90 capsule, Rfl: 2 .  losartan (COZAAR) 100 MG tablet, Take 1 tablet (100 mg total) by mouth daily., Disp: 90 tablet, Rfl: 1 .  Misc Natural Product Nasal (NASAL CLEANSE RINSE MIX NA), Place 1 Dose into the nose daily as needed (congestion)., Disp: , Rfl:  .  Multiple Vitamin (MULTIVITAMIN WITH MINERALS) TABS tablet, Take 1 tablet by mouth daily., Disp: , Rfl:  .  omeprazole (PRILOSEC) 40 MG capsule, Take 1 capsule (40 mg total) by mouth 2 (two) times daily., Disp: 180 capsule, Rfl: 1 .  ondansetron (ZOFRAN) 4 MG tablet, Take 1 tablet (4 mg total) by mouth every 8 (eight) hours as needed for nausea or vomiting., Disp: 90 tablet, Rfl: 1 .  Pancrelipase, Lip-Prot-Amyl, (CREON) 24000-76000 units CPEP, Take 2 capsules with each meal (3 times a day) (Patient taking differently: Take 2 capsules by mouth 3 (three) times daily with meals. ), Disp: 180 capsule, Rfl: 3 .  polyethylene glycol (MIRALAX / GLYCOLAX) 17 g packet, Take 17 g by mouth 2 (two) times daily., Disp: 28 packet, Rfl: 0 .   fluconazole (DIFLUCAN) 150 MG tablet, Take 1 tablet (150 mg total) by mouth once for 1 dose., Disp: 1 tablet, Rfl: 0  Review of Systems:  Constitutional: Denies fever, chills, diaphoresis, appetite change and fatigue.  HEENT: Denies photophobia, redness, hearing loss, ear pain, congestion, sore throat, rhinorrhea, sneezing, mouth sores, trouble swallowing, neck pain, neck stiffness and tinnitus.   Respiratory: Denies SOB, DOE, cough, chest tightness,  and wheezing.   Cardiovascular: Denies chest pain, palpitations and leg swelling.  Gastrointestinal: Denies nausea, vomiting, abdominal pain, diarrhea, constipation, blood in stool and  abdominal distention.  Genitourinary: Denies dysuria, urgency, frequency, hematuria, flank pain and difficulty urinating.  Endocrine: Denies: hot or cold intolerance, sweats, changes in hair or nails, polyuria, polydipsia. Musculoskeletal: Denies myalgias, back pain, joint swelling, arthralgias and gait problem.  Skin: Denies pallor, rash and wound.  Neurological: Denies dizziness, seizures, syncope, weakness, light-headedness, numbness and headaches.  Hematological: Denies adenopathy. Easy bruising, personal or family bleeding history  Psychiatric/Behavioral: Denies suicidal ideation, mood changes, confusion, nervousness, sleep disturbance and agitation    Physical Exam: Vitals:   06/08/19 1053  BP: 110/78  Pulse: (!) 53  Temp: 97.7 F (36.5 C)  TempSrc: Temporal  SpO2: 96%  Weight: 132 lb (59.9 kg)    Body mass index is 25.78 kg/m.   Constitutional: NAD, calm, comfortable Eyes: PERRL, lids and conjunctivae normal, wears corrective lenses ENMT: Mucous membranes are moist.  Respiratory: clear to auscultation bilaterally, no wheezing, no crackles. Normal respiratory effort. No accessory muscle use.  Cardiovascular: Regular rate and rhythm, no murmurs / rubs / gallops. No extremity edema.  Abdomen: no tenderness, no masses palpated. No  hepatosplenomegaly. Bowel sounds positive.  Neurologic: Grossly intact and nonfocal Psychiatric: Normal judgment and insight. Alert and oriented x 3. Normal mood.    Impression and Plan:  Essential hypertension  -Fair control, refill losartan.  Vaginal yeast infection  -Diflucan 150 mg x 1.  Eye pressure  - Plan: Ambulatory referral to Ophthalmology    Patient Instructions  -Nice seeing you today!!  -Diflucan 150 mg x 1.  -Schedule follow up for your physical in 3-4 months.      Lelon Frohlich, MD [facility]

## 2019-06-08 NOTE — Patient Instructions (Addendum)
-  Nice seeing you today!!  -Diflucan 150 mg x 1.  -Remember to update your tdap vaccine at your pharmacy.  -Schedule follow up for your physical in 3-4 months.

## 2019-07-05 ENCOUNTER — Telehealth: Payer: Self-pay | Admitting: Internal Medicine

## 2019-07-05 NOTE — Telephone Encounter (Signed)
Pt is calling in stating that she is out of Rx cetirizine (ZYRTEC) 10 MG  Pharm:  [facility].

## 2019-07-06 MED ORDER — CETIRIZINE HCL 10 MG PO TABS: 10.0000 mg | ORAL_TABLET | Freq: Every day | ORAL | 1 refills | Status: DC

## 2019-07-06 NOTE — Telephone Encounter (Signed)
Ok to refill, but this is also OTC.Marland KitchenMarland Kitchen

## 2019-07-06 NOTE — Addendum Note (Signed)
Addended by: Westley Hummer B on: 07/06/2019 09:01 AM   Modules accepted: Orders

## 2019-07-06 NOTE — Telephone Encounter (Signed)
Refill sent.

## 2019-08-30 ENCOUNTER — Other Ambulatory Visit: Payer: Self-pay | Admitting: Internal Medicine

## 2019-09-06 ENCOUNTER — Telehealth: Payer: Self-pay | Admitting: Internal Medicine

## 2019-09-06 MED ORDER — LOPERAMIDE HCL 2 MG PO CAPS: 2.0000 mg | ORAL_CAPSULE | Freq: Three times a day (TID) | ORAL | 2 refills | Status: DC | PRN

## 2019-09-06 NOTE — Telephone Encounter (Signed)
Lomotil refilled

## 2019-09-15 ENCOUNTER — Other Ambulatory Visit: Payer: Self-pay

## 2019-09-16 ENCOUNTER — Encounter: Payer: Self-pay | Admitting: Internal Medicine

## 2019-09-16 ENCOUNTER — Ambulatory Visit (INDEPENDENT_AMBULATORY_CARE_PROVIDER_SITE_OTHER): Payer: Medicare Other | Admitting: Internal Medicine

## 2019-09-16 VITALS — BP 130/80 | HR 66 | Temp 97.8°F | Ht 60.0 in | Wt 136.4 lb

## 2019-09-16 DIAGNOSIS — Z0000 Encounter for general adult medical examination without abnormal findings: Secondary | ICD-10-CM

## 2019-09-16 DIAGNOSIS — I10 Essential (primary) hypertension: Secondary | ICD-10-CM

## 2019-09-16 DIAGNOSIS — F339 Major depressive disorder, recurrent, unspecified: Secondary | ICD-10-CM | POA: Diagnosis not present

## 2019-09-16 DIAGNOSIS — J302 Other seasonal allergic rhinitis: Secondary | ICD-10-CM | POA: Diagnosis not present

## 2019-09-16 DIAGNOSIS — Z23 Encounter for immunization: Secondary | ICD-10-CM | POA: Diagnosis not present

## 2019-09-16 DIAGNOSIS — K219 Gastro-esophageal reflux disease without esophagitis: Secondary | ICD-10-CM

## 2019-09-16 DIAGNOSIS — L309 Dermatitis, unspecified: Secondary | ICD-10-CM

## 2019-09-16 DIAGNOSIS — Z1231 Encounter for screening mammogram for malignant neoplasm of breast: Secondary | ICD-10-CM

## 2019-09-16 MED ORDER — TRIAMCINOLONE ACETONIDE 0.1 % EX CREA: 1.0000 "application " | TOPICAL_CREAM | Freq: Two times a day (BID) | CUTANEOUS | 2 refills | Status: DC

## 2019-09-16 MED ORDER — OMEPRAZOLE 40 MG PO CPDR: 40.0000 mg | DELAYED_RELEASE_CAPSULE | Freq: Two times a day (BID) | ORAL | 1 refills | Status: DC

## 2019-09-16 MED ORDER — CETIRIZINE HCL 10 MG PO TABS: 10.0000 mg | ORAL_TABLET | Freq: Every day | ORAL | 1 refills | Status: DC

## 2019-09-16 MED ORDER — LOSARTAN POTASSIUM 100 MG PO TABS: 100.0000 mg | ORAL_TABLET | Freq: Every day | ORAL | 1 refills | Status: DC

## 2019-09-16 MED ORDER — DULOXETINE HCL 60 MG PO CPEP: 60.0000 mg | ORAL_CAPSULE | Freq: Two times a day (BID) | ORAL | 1 refills | Status: DC

## 2019-09-16 NOTE — Addendum Note (Signed)
Addended by: Westley Hummer B on: 09/16/2019 01:31 PM   Modules accepted: Orders

## 2019-09-16 NOTE — Patient Instructions (Signed)
-Nice seeing you today!!  -Lab work today; will notify you once results are available.  -Pneumonia vaccine today.  -Schedule follow up in 1 year or sooner as needed.   Preventive Care 74 Years and Older, Female Preventive care refers to lifestyle choices and visits with your health care provider that can promote health and wellness. This includes:  A yearly physical exam. This is also called an annual well check.  Regular dental and eye exams.  Immunizations.  Screening for certain conditions.  Healthy lifestyle choices, such as diet and exercise. What can I expect for my preventive care visit? Physical exam Your health care provider will check:  Height and weight. These may be used to calculate body mass index (BMI), which is a measurement that tells if you are at a healthy weight.  Heart rate and blood pressure.  Your skin for abnormal spots. Counseling Your health care provider may ask you questions about:  Alcohol, tobacco, and drug use.  Emotional well-being.  Home and relationship well-being.  Sexual activity.  Eating habits.  History of falls.  Memory and ability to understand (cognition).  Work and work Statistician.  Pregnancy and menstrual history. What immunizations do I need?  Influenza (flu) vaccine  This is recommended every year. Tetanus, diphtheria, and pertussis (Tdap) vaccine  You may need a Td booster every 10 years. Varicella (chickenpox) vaccine  You may need this vaccine if you have not already been vaccinated. Zoster (shingles) vaccine  You may need this after age 53. Pneumococcal conjugate (PCV13) vaccine  One dose is recommended after age 56. Pneumococcal polysaccharide (PPSV23) vaccine  One dose is recommended after age 11. Measles, mumps, and rubella (MMR) vaccine  You may need at least one dose of MMR if you were born in 1957 or later. You may also need a second dose. Meningococcal conjugate (MenACWY) vaccine  You  may need this if you have certain conditions. Hepatitis A vaccine  You may need this if you have certain conditions or if you travel or work in places where you may be exposed to hepatitis A. Hepatitis B vaccine  You may need this if you have certain conditions or if you travel or work in places where you may be exposed to hepatitis B. Haemophilus influenzae type b (Hib) vaccine  You may need this if you have certain conditions. You may receive vaccines as individual doses or as more than one vaccine together in one shot (combination vaccines). Talk with your health care provider about the risks and benefits of combination vaccines. What tests do I need? Blood tests  Lipid and cholesterol levels. These may be checked every 5 years, or more frequently depending on your overall health.  Hepatitis C test.  Hepatitis B test. Screening  Lung cancer screening. You may have this screening every year starting at age 51 if you have a 30-pack-year history of smoking and currently smoke or have quit within the past 15 years.  Colorectal cancer screening. All adults should have this screening starting at age 88 and continuing until age 69. Your health care provider may recommend screening at age 103 if you are at increased risk. You will have tests every 1-10 years, depending on your results and the type of screening test.  Diabetes screening. This is done by checking your blood sugar (glucose) after you have not eaten for a while (fasting). You may have this done every 1-3 years.  Mammogram. This may be done every 1-2 years. Talk with your  health care provider about how often you should have regular mammograms.  BRCA-related cancer screening. This may be done if you have a family history of breast, ovarian, tubal, or peritoneal cancers. Other tests  Sexually transmitted disease (STD) testing.  Bone density scan. This is done to screen for osteoporosis. You may have this done starting at age  51. Follow these instructions at home: Eating and drinking  Eat a diet that includes fresh fruits and vegetables, whole grains, lean protein, and low-fat dairy products. Limit your intake of foods with high amounts of sugar, saturated fats, and salt.  Take vitamin and mineral supplements as recommended by your health care provider.  Do not drink alcohol if your health care provider tells you not to drink.  If you drink alcohol: ? Limit how much you have to 0-1 drink a day. ? Be aware of how much alcohol is in your drink. In the [place] one drink equals one 12 oz bottle of beer (355 mL), one 5 oz glass of wine (148 mL), or one 1 oz glass of hard liquor (44 mL). Lifestyle  Take daily care of your teeth and gums.  Stay active. Exercise for at least 30 minutes on 5 or more days each week.  Do not use any products that contain nicotine or tobacco, such as cigarettes, e-cigarettes, and chewing tobacco. If you need help quitting, ask your health care provider.  If you are sexually active, practice safe sex. Use a condom or other form of protection in order to prevent STIs (sexually transmitted infections).  Talk with your health care provider about taking a low-dose aspirin or statin. What's next?  Go to your health care provider once a year for a well check visit.  Ask your health care provider how often you should have your eyes and teeth checked.  Stay up to date on all vaccines. This information is not intended to replace advice given to you by your health care provider. Make sure you discuss any questions you have with your health care provider. Document Revised: 12/18/2017 Document Reviewed: 12/18/2017 [employer]vier Patient Education  2020 [employer].

## 2019-09-16 NOTE — Progress Notes (Signed)
Established Patient Office Visit     This visit occurred during the SARS-CoV-2 public health emergency.  Safety protocols were in place, including screening questions prior to the visit, additional usage of staff PPE, and extensive cleaning of exam room while observing appropriate contact time as indicated for disinfecting solutions.    CC/Reason for Visit: Subsequent Medicare wellness visit and follow-up chronic medical conditions  HPI: [name] is a 74 y.o. female who is coming in today for the above mentioned reasons. Past Medical History is significant for: bilateral knee osteoarthritis status post bilateral TKR, Crohn's disease diagnosed in 1997 that has been in remission,a large hiatal hernia, hypertension that has been well controlled, pancreatic insufficiency for which she uses Creon with meals, significant scoliosis.  She has routine eye and dental care.  She wears hearing aids, she does not do any formal physical activity although she does yard work on a daily basis.  She is fully vaccinated against Covid.  She does not get flu vaccines due to her egg allergy.  She is requesting a mammogram.  She had a colonoscopy in 2020.   Past Medical/Surgical History: Past Medical History:  Diagnosis Date  . Allergies   . Bruises easily   . Complication of anesthesia   . Crohn's disease ([place])    GI Dr Beulah Gandy in [place], [place]   . Deviated septum   . GERD (gastroesophageal reflux disease)   . Headache    occ   . Hiatal hernia   . HTN (hypertension)    pcp Dr Margaretann Loveless  in [place], [place]   . Neuropathy    fingers,periodically   . Osteoarthritis   . Pancreatic insufficiency   . PONV (postoperative nausea and vomiting)   . Rheumatoid arthritis ([place])   . Scoliosis   . Squamous cell carcinoma of arm    left   . Whooping cough 2015  . Whooping cough with pneumonia    2017    Past Surgical History:  Procedure Laterality Date  . CATARACT  EXTRACTION, BILATERAL  2018   with IOC implant   . COLONOSCOPY    . DEBRIDEMENT AND CLOSURE WOUND Right 10/01/2018   Procedure: Excision of right knee wound with placement of ACell and Pravena;  Surgeon: Wallace Going, DO;  Location: [facility];  Service: Plastics;  Laterality: Right;  30 min  . JOINT REPLACEMENT  2020   right knee  . knee fracture     . NASAL FRACTURE SURGERY     needed b/c she was getting frequent sinus infections   . 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 TOTAL KNEE ARTHROPLASTY Left 03/02/2019   Procedure: TOTAL KNEE ARTHROPLASTY;  Surgeon: Paralee Cancel, MD;  Location: [facility];  Service: Orthopedics;  Laterality: Left;  70 mins  . UPPER GASTROINTESTINAL ENDOSCOPY      Social History:  reports that she quit smoking about 31 years ago. Her smoking use included cigarettes. She has a 30.00 pack-year smoking history. She has never used smokeless tobacco. She reports previous alcohol use. She reports that she does not use drugs.  Allergies: Allergies  Allergen Reactions  . Adhesive [Tape]     Redness and skin peeling  . Aspirin     Intestinal Bleeding  . Ciprofloxacin Nausea And Vomiting  . Codeine Nausea And Vomiting  .  Demerol [Meperidine Hcl] Nausea And Vomiting  . Lactose Intolerance (Gi) Other (See Comments)    Pt has Crohn's   . Latex     Redness and skin peeling  . Other     Nuts-itching in throat  Seeds-stomach issues with chrons    . Septra [Sulfamethoxazole-Trimethoprim] Nausea Only    Family History:  Family History  Problem Relation Age of Onset  . Diabetes Father   . Asthma Neg Hx      Current Outpatient Medications:  .  Calcium Citrate-Vitamin D (CALCIUM + D PO), Take 1 tablet by mouth 2 (two) times daily., Disp: , Rfl:  .  cetirizine (ZYRTEC) 10 MG tablet, Take 1 tablet (10 mg total) by  mouth daily., Disp: 90 tablet, Rfl: 1 .  dicyclomine (BENTYL) 20 MG tablet, TAKE 1 TABLET (20 MG TOTAL) BY MOUTH 4 (FOUR) TIMES DAILY AS NEEDED FOR SPASMS., Disp: 360 tablet, Rfl: 1 .  docusate sodium (COLACE) 100 MG capsule, Take 1 capsule (100 mg total) by mouth 2 (two) times daily., Disp: 28 capsule, Rfl: 0 .  DULoxetine (CYMBALTA) 60 MG capsule, Take 1 capsule (60 mg total) by mouth 2 (two) times daily., Disp: 180 capsule, Rfl: 1 .  fluticasone (FLONASE) 50 MCG/ACT nasal spray, SPRAY 2 SPRAYS INTO EACH NOSTRIL EVERY DAY, Disp: 48 mL, Rfl: 1 .  lipase/protease/amylase (CREON) 12000 units CPEP capsule, Take 2 capsules (24,000 Units total) by mouth 3 (three) times daily with meals., Disp: 540 capsule, Rfl: 1 .  loperamide (IMODIUM) 2 MG capsule, Take 1 capsule (2 mg total) by mouth 3 (three) times daily as needed for diarrhea or loose stools., Disp: 90 capsule, Rfl: 2 .  losartan (COZAAR) 100 MG tablet, Take 1 tablet (100 mg total) by mouth daily., Disp: 90 tablet, Rfl: 1 .  Misc Natural Product Nasal (NASAL CLEANSE RINSE MIX NA), Place 1 Dose into the nose daily as needed (congestion)., Disp: , Rfl:  .  Multiple Vitamin (MULTIVITAMIN WITH MINERALS) TABS tablet, Take 1 tablet by mouth daily., Disp: , Rfl:  .  omeprazole (PRILOSEC) 40 MG capsule, Take 1 capsule (40 mg total) by mouth 2 (two) times daily., Disp: 180 capsule, Rfl: 1 .  ondansetron (ZOFRAN) 4 MG tablet, Take 1 tablet (4 mg total) by mouth every 8 (eight) hours as needed for nausea or vomiting., Disp: 90 tablet, Rfl: 1 .  Pancrelipase, Lip-Prot-Amyl, (CREON) 24000-76000 units CPEP, Take 2 capsules with each meal (3 times a day) (Patient taking differently: Take 2 capsules by mouth 3 (three) times daily with meals. ), Disp: 180 capsule, Rfl: 3 .  polyethylene glycol (MIRALAX / GLYCOLAX) 17 g packet, Take 17 g by mouth 2 (two) times daily., Disp: 28 packet, Rfl: 0 .  triamcinolone cream (KENALOG) 0.1 %, Apply 1 application topically 2 (two)  times daily., Disp: 80 g, Rfl: 2  Review of Systems:  Constitutional: Denies fever, chills, diaphoresis, appetite change and fatigue.  HEENT: Denies photophobia, eye pain, redness, hearing loss, ear pain, congestion, sore throat, rhinorrhea, sneezing, mouth sores, trouble swallowing, neck pain, neck stiffness and tinnitus.   Respiratory: Denies SOB, DOE, cough, chest tightness,  and wheezing.   Cardiovascular: Denies chest pain, palpitations and leg swelling.  Gastrointestinal: Denies nausea, vomiting, abdominal pain, diarrhea, constipation, blood in stool and abdominal distention.  Genitourinary: Denies dysuria, urgency, frequency, hematuria, flank pain and difficulty urinating.  Endocrine: Denies: hot or cold intolerance, sweats, changes in hair or nails, polyuria, polydipsia. Musculoskeletal: Denies myalgias, back pain, joint  swelling, arthralgias and gait problem.  Skin: Denies pallor, rash and wound.  Neurological: Denies dizziness, seizures, syncope, weakness, light-headedness, numbness and headaches.  Hematological: Denies adenopathy. Easy bruising, personal or family bleeding history  Psychiatric/Behavioral: Denies suicidal ideation, mood changes, confusion, nervousness, sleep disturbance and agitation    Physical Exam: Vitals:   09/16/19 1101  BP: 130/80  Pulse: 66  Temp: 97.8 F (36.6 C)  TempSrc: Oral  Weight: 136 lb 6.4 oz (61.9 kg)  Height: 5' (1.524 m)    Body mass index is 26.64 kg/m.   Constitutional: NAD, calm, comfortable Eyes: PERRL, lids and conjunctivae normal ENMT: Mucous membranes are moist.  Tympanic membrane is pearly white, no erythema or bulging. Neck: normal, supple, no masses, no thyromegaly Respiratory: clear to auscultation bilaterally, no wheezing, no crackles. Normal respiratory effort. No accessory muscle use.  Cardiovascular: Regular rate and rhythm, no murmurs / rubs / gallops. No extremity edema. 2+ pedal pulses. No carotid bruits.    Abdomen: no tenderness, no masses palpated. No hepatosplenomegaly. Bowel sounds positive.  Musculoskeletal: no clubbing / cyanosis. No joint deformity upper and lower extremities. Good ROM, no contractures. Normal muscle tone.  Skin: no rashes, lesions, ulcers. No induration Neurologic: CN 2-12 grossly intact. Sensation intact, DTR normal. Strength 5/5 in all 4.  Psychiatric: Normal judgment and insight. Alert and oriented x 3. Normal mood.      Subsequent Medicare wellness visit   1. Risk factors, based on past  M,S,F -cardiovascular disease risk factors include age, history of hypertension   2.  Physical activities: Occasional yard work only   3.  Depression/mood:  Stable, not depressed although she has a history of depression   4.  Hearing:  Wears hearing aids   5.  ADL's: Independent in all ADLs   6.  Fall risk:  Moderate to high fall risk due to history of falls   7.  Home safety: No problems identified   8.  Height weight, and visual acuity: Height and weight as above, visual acuity is 20/25 which each eye independently and combined   9.  Counseling:  Advised to get her mammogram   10. Lab orders based on risk factors: Laboratory update will be reviewed   11. Referral :  None today   12. Care plan:  Follow-up with me in 6 to 12 months   13. Cognitive assessment:  No cognitive impairment   14. Screening: Patient provided with a written and personalized 5-10 year screening schedule in the AVS.   yes   15. Provider List Update:   PCP, GI, ophthalmology  16. Advance Directives: Full code     Office Visit from 09/16/2019 in [facility] at [facility]  PHQ-9 Total Score 0      Fall Risk  09/16/2019 06/08/2019  Falls in the past year? 0 0  Number falls in past yr: 0 0  Injury with Fall? 0 0      Impression and Plan:  Encounter for preventive health examination -Advised routine eye and dental care. -She will get Pneumovax today, no flu vaccine due to egg  allergy, she is fully vaccinated against Covid. -Labs today. -Healthy lifestyle discussed in detail. -She had a colonoscopy in 2020 and does not plan on getting any further. -Mammogram requested today. -She declines further Pap smears.  Essential hypertension -Fair control on current regimen.  Depression, recurrent ([place])  - Plan: DULoxetine (CYMBALTA) 60 MG capsule -Mood is stable.  Gastroesophageal reflux disease without esophagitis  - Plan:  omeprazole (PRILOSEC) 40 MG capsule  Seasonal allergies  - Plan: cetirizine (ZYRTEC) 10 MG tablet  Eczema, unspecified type  - Plan: triamcinolone cream (KENALOG) 0.1 %  Need for vaccination for Strep pneumoniae -Pneumovax administered today.   Patient Instructions  -Nice seeing you today!!  -Lab work today; will notify you once results are available.  -Pneumonia vaccine today.  -Schedule follow up in 1 year or sooner as needed.   Preventive Care 42 Years and Older, Female Preventive care refers to lifestyle choices and visits with your health care provider that can promote health and wellness. This includes:  A yearly physical exam. This is also called an annual well check.  Regular dental and eye exams.  Immunizations.  Screening for certain conditions.  Healthy lifestyle choices, such as diet and exercise. What can I expect for my preventive care visit? Physical exam Your health care provider will check:  Height and weight. These may be used to calculate body mass index (BMI), which is a measurement that tells if you are at a healthy weight.  Heart rate and blood pressure.  Your skin for abnormal spots. Counseling Your health care provider may ask you questions about:  Alcohol, tobacco, and drug use.  Emotional well-being.  Home and relationship well-being.  Sexual activity.  Eating habits.  History of falls.  Memory and ability to understand (cognition).  Work and work Statistician.  Pregnancy and  menstrual history. What immunizations do I need?  Influenza (flu) vaccine  This is recommended every year. Tetanus, diphtheria, and pertussis (Tdap) vaccine  You may need a Td booster every 10 years. Varicella (chickenpox) vaccine  You may need this vaccine if you have not already been vaccinated. Zoster (shingles) vaccine  You may need this after age 64. Pneumococcal conjugate (PCV13) vaccine  One dose is recommended after age 49. Pneumococcal polysaccharide (PPSV23) vaccine  One dose is recommended after age 54. Measles, mumps, and rubella (MMR) vaccine  You may need at least one dose of MMR if you were born in 1957 or later. You may also need a second dose. Meningococcal conjugate (MenACWY) vaccine  You may need this if you have certain conditions. Hepatitis A vaccine  You may need this if you have certain conditions or if you travel or work in places where you may be exposed to hepatitis A. Hepatitis B vaccine  You may need this if you have certain conditions or if you travel or work in places where you may be exposed to hepatitis B. Haemophilus influenzae type b (Hib) vaccine  You may need this if you have certain conditions. You may receive vaccines as individual doses or as more than one vaccine together in one shot (combination vaccines). Talk with your health care provider about the risks and benefits of combination vaccines. What tests do I need? Blood tests  Lipid and cholesterol levels. These may be checked every 5 years, or more frequently depending on your overall health.  Hepatitis C test.  Hepatitis B test. Screening  Lung cancer screening. You may have this screening every year starting at age 67 if you have a 30-pack-year history of smoking and currently smoke or have quit within the past 15 years.  Colorectal cancer screening. All adults should have this screening starting at age 43 and continuing until age 14. Your health care provider may  recommend screening at age 60 if you are at increased risk. You will have tests every 1-10 years, depending on your results and the type  of screening test.  Diabetes screening. This is done by checking your blood sugar (glucose) after you have not eaten for a while (fasting). You may have this done every 1-3 years.  Mammogram. This may be done every 1-2 years. Talk with your health care provider about how often you should have regular mammograms.  BRCA-related cancer screening. This may be done if you have a family history of breast, ovarian, tubal, or peritoneal cancers. Other tests  Sexually transmitted disease (STD) testing.  Bone density scan. This is done to screen for osteoporosis. You may have this done starting at age 25. Follow these instructions at home: Eating and drinking  Eat a diet that includes fresh fruits and vegetables, whole grains, lean protein, and low-fat dairy products. Limit your intake of foods with high amounts of sugar, saturated fats, and salt.  Take vitamin and mineral supplements as recommended by your health care provider.  Do not drink alcohol if your health care provider tells you not to drink.  If you drink alcohol: ? Limit how much you have to 0-1 drink a day. ? Be aware of how much alcohol is in your drink. In the [place] one drink equals one 12 oz bottle of beer (355 mL), one 5 oz glass of wine (148 mL), or one 1 oz glass of hard liquor (44 mL). Lifestyle  Take daily care of your teeth and gums.  Stay active. Exercise for at least 30 minutes on 5 or more days each week.  Do not use any products that contain nicotine or tobacco, such as cigarettes, e-cigarettes, and chewing tobacco. If you need help quitting, ask your health care provider.  If you are sexually active, practice safe sex. Use a condom or other form of protection in order to prevent STIs (sexually transmitted infections).  Talk with your health care provider about taking a low-dose  aspirin or statin. What's next?  Go to your health care provider once a year for a well check visit.  Ask your health care provider how often you should have your eyes and teeth checked.  Stay up to date on all vaccines. This information is not intended to replace advice given to you by your health care provider. Make sure you discuss any questions you have with your health care provider. Document Revised: 12/18/2017 Document Reviewed: 12/18/2017 Elsevier Patient Education  2020 [place], MD [facility]    Impression and Plan:  Encounter for preventive health examination  Essential hypertension - Plan: losartan (COZAAR) 100 MG tablet, CBC with Differential/Platelet, Comprehensive metabolic panel, Lipid panel  Depression, recurrent ([place]) - Plan: DULoxetine (CYMBALTA) 60 MG capsule  Gastroesophageal reflux disease without esophagitis - Plan: omeprazole (PRILOSEC) 40 MG capsule  Seasonal allergies - Plan: cetirizine (ZYRTEC) 10 MG tablet  Eczema, unspecified type - Plan: triamcinolone cream (KENALOG) 0.1 %  Need for vaccination for Strep pneumoniae    Patient Instructions  -Nice seeing you today!!  -Lab work today; will notify you once results are available.  -Pneumonia vaccine today.  -Schedule follow up in 1 year or sooner as needed.   Preventive Care 65 Years and Older, Female Preventive care refers to lifestyle choices and visits with your health care provider that can promote health and wellness. This includes:  A yearly physical exam. This is also called an annual well check.  Regular dental and eye exams.  Immunizations.  Screening for certain conditions.  Healthy lifestyle choices,  such as diet and exercise. What can I expect for my preventive care visit? Physical exam Your health care provider will check:  Height and weight. These may be used to calculate body mass index (BMI), which is a  measurement that tells if you are at a healthy weight.  Heart rate and blood pressure.  Your skin for abnormal spots. Counseling Your health care provider may ask you questions about:  Alcohol, tobacco, and drug use.  Emotional well-being.  Home and relationship well-being.  Sexual activity.  Eating habits.  History of falls.  Memory and ability to understand (cognition).  Work and work Statistician.  Pregnancy and menstrual history. What immunizations do I need?  Influenza (flu) vaccine  This is recommended every year. Tetanus, diphtheria, and pertussis (Tdap) vaccine  You may need a Td booster every 10 years. Varicella (chickenpox) vaccine  You may need this vaccine if you have not already been vaccinated. Zoster (shingles) vaccine  You may need this after age 16. Pneumococcal conjugate (PCV13) vaccine  One dose is recommended after age 81. Pneumococcal polysaccharide (PPSV23) vaccine  One dose is recommended after age 49. Measles, mumps, and rubella (MMR) vaccine  You may need at least one dose of MMR if you were born in 1957 or later. You may also need a second dose. Meningococcal conjugate (MenACWY) vaccine  You may need this if you have certain conditions. Hepatitis A vaccine  You may need this if you have certain conditions or if you travel or work in places where you may be exposed to hepatitis A. Hepatitis B vaccine  You may need this if you have certain conditions or if you travel or work in places where you may be exposed to hepatitis B. Haemophilus influenzae type b (Hib) vaccine  You may need this if you have certain conditions. You may receive vaccines as individual doses or as more than one vaccine together in one shot (combination vaccines). Talk with your health care provider about the risks and benefits of combination vaccines. What tests do I need? Blood tests  Lipid and cholesterol levels. These may be checked every 5 years, or more  frequently depending on your overall health.  Hepatitis C test.  Hepatitis B test. Screening  Lung cancer screening. You may have this screening every year starting at age 6 if you have a 30-pack-year history of smoking and currently smoke or have quit within the past 15 years.  Colorectal cancer screening. All adults should have this screening starting at age 4 and continuing until age 52. Your health care provider may recommend screening at age 45 if you are at increased risk. You will have tests every 1-10 years, depending on your results and the type of screening test.  Diabetes screening. This is done by checking your blood sugar (glucose) after you have not eaten for a while (fasting). You may have this done every 1-3 years.  Mammogram. This may be done every 1-2 years. Talk with your health care provider about how often you should have regular mammograms.  BRCA-related cancer screening. This may be done if you have a family history of breast, ovarian, tubal, or peritoneal cancers. Other tests  Sexually transmitted disease (STD) testing.  Bone density scan. This is done to screen for osteoporosis. You may have this done starting at age 1. Follow these instructions at home: Eating and drinking  Eat a diet that includes fresh fruits and vegetables, whole grains, lean protein, and low-fat dairy products. Limit your intake  of foods with high amounts of sugar, saturated fats, and salt.  Take vitamin and mineral supplements as recommended by your health care provider.  Do not drink alcohol if your health care provider tells you not to drink.  If you drink alcohol: ? Limit how much you have to 0-1 drink a day. ? Be aware of how much alcohol is in your drink. In the [place] one drink equals one 12 oz bottle of beer (355 mL), one 5 oz glass of wine (148 mL), or one 1 oz glass of hard liquor (44 mL). Lifestyle  Take daily care of your teeth and gums.  Stay active. Exercise for at  least 30 minutes on 5 or more days each week.  Do not use any products that contain nicotine or tobacco, such as cigarettes, e-cigarettes, and chewing tobacco. If you need help quitting, ask your health care provider.  If you are sexually active, practice safe sex. Use a condom or other form of protection in order to prevent STIs (sexually transmitted infections).  Talk with your health care provider about taking a low-dose aspirin or statin. What's next?  Go to your health care provider once a year for a well check visit.  Ask your health care provider how often you should have your eyes and teeth checked.  Stay up to date on all vaccines. This information is not intended to replace advice given to you by your health care provider. Make sure you discuss any questions you have with your health care provider. Document Revised: 12/18/2017 Document Reviewed: 12/18/2017 Elsevier Patient Education  2020 [place], MD [facility]

## 2019-09-17 ENCOUNTER — Encounter: Payer: Self-pay | Admitting: Internal Medicine

## 2019-09-17 DIAGNOSIS — E785 Hyperlipidemia, unspecified: Secondary | ICD-10-CM | POA: Insufficient documentation

## 2019-09-17 LAB — CBC WITH DIFFERENTIAL/PLATELET
Absolute Monocytes: 488 cells/uL (ref 200–950)
Basophils Absolute: 88 cells/uL (ref 0–200)
Basophils Relative: 1.1 %
Eosinophils Absolute: 208 cells/uL (ref 15–500)
Eosinophils Relative: 2.6 %
HCT: 40.9 % (ref 35.0–45.0)
Hemoglobin: 13.5 g/dL (ref 11.7–15.5)
Lymphs Abs: 1320 cells/uL (ref 850–3900)
MCH: 29.8 pg (ref 27.0–33.0)
MCHC: 33 g/dL (ref 32.0–36.0)
MCV: 90.3 fL (ref 80.0–100.0)
MPV: 10 fL (ref 7.5–12.5)
Monocytes Relative: 6.1 %
Neutro Abs: 5896 cells/uL (ref 1500–7800)
Neutrophils Relative %: 73.7 %
Platelets: 363 10*3/uL (ref 140–400)
RBC: 4.53 10*6/uL (ref 3.80–5.10)
RDW: 13.6 % (ref 11.0–15.0)
Total Lymphocyte: 16.5 %
WBC: 8 10*3/uL (ref 3.8–10.8)

## 2019-09-17 LAB — COMPREHENSIVE METABOLIC PANEL
AG Ratio: 1.5 (calc) (ref 1.0–2.5)
ALT: 10 U/L (ref 6–29)
AST: 17 U/L (ref 10–35)
Albumin: 4.4 g/dL (ref 3.6–5.1)
Alkaline phosphatase (APISO): 91 U/L (ref 37–153)
BUN/Creatinine Ratio: 27 (calc) — ABNORMAL HIGH (ref 6–22)
BUN: 34 mg/dL — ABNORMAL HIGH (ref 7–25)
CO2: 29 mmol/L (ref 20–32)
Calcium: 10 mg/dL (ref 8.6–10.4)
Chloride: 98 mmol/L (ref 98–110)
Creat: 1.27 mg/dL — ABNORMAL HIGH (ref 0.60–0.93)
Globulin: 3 g/dL (calc) (ref 1.9–3.7)
Glucose, Bld: 98 mg/dL (ref 65–99)
Potassium: 4.7 mmol/L (ref 3.5–5.3)
Sodium: 137 mmol/L (ref 135–146)
Total Bilirubin: 0.5 mg/dL (ref 0.2–1.2)
Total Protein: 7.4 g/dL (ref 6.1–8.1)

## 2019-09-17 LAB — LIPID PANEL
Cholesterol: 223 mg/dL — ABNORMAL HIGH (ref <200)
HDL: 54 mg/dL (ref >=50)
LDL Cholesterol (Calc): 143 mg/dL (calc) — ABNORMAL HIGH
Non-HDL Cholesterol (Calc): 169 mg/dL (calc) — ABNORMAL HIGH (ref <130)
Total CHOL/HDL Ratio: 4.1 (calc) (ref <5.0)
Triglycerides: 140 mg/dL (ref <150)

## 2019-09-22 ENCOUNTER — Telehealth: Payer: Self-pay | Admitting: Internal Medicine

## 2019-09-22 DIAGNOSIS — R944 Abnormal results of kidney function studies: Secondary | ICD-10-CM

## 2019-09-22 NOTE — Telephone Encounter (Signed)
Patient is aware.  See lab result note

## 2019-09-22 NOTE — Telephone Encounter (Signed)
Pt call and stated she want a call back about her labs result and want a copy sent to her.

## 2019-09-28 ENCOUNTER — Other Ambulatory Visit: Payer: Self-pay

## 2019-09-28 ENCOUNTER — Ambulatory Visit
Admission: RE | Admit: 2019-09-28 | Discharge: 2019-09-28 | Disposition: A | Discharge status: Home / Self Care | Payer: Medicare Other | Source: Ambulatory Visit | Attending: Internal Medicine | Admitting: Internal Medicine

## 2019-09-28 DIAGNOSIS — Z1231 Encounter for screening mammogram for malignant neoplasm of breast: Secondary | ICD-10-CM

## 2019-09-30 ENCOUNTER — Other Ambulatory Visit: Payer: Self-pay | Admitting: Internal Medicine

## 2019-09-30 DIAGNOSIS — R928 Other abnormal and inconclusive findings on diagnostic imaging of breast: Secondary | ICD-10-CM

## 2019-10-06 ENCOUNTER — Other Ambulatory Visit: Payer: Medicare Other

## 2019-10-06 ENCOUNTER — Other Ambulatory Visit: Payer: Self-pay

## 2019-10-06 DIAGNOSIS — R944 Abnormal results of kidney function studies: Secondary | ICD-10-CM

## 2019-10-07 LAB — BASIC METABOLIC PANEL
BUN/Creatinine Ratio: 21 (calc) (ref 6–22)
BUN: 25 mg/dL (ref 7–25)
CO2: 25 mmol/L (ref 20–32)
Calcium: 9.8 mg/dL (ref 8.6–10.4)
Chloride: 102 mmol/L (ref 98–110)
Creat: 1.17 mg/dL — ABNORMAL HIGH (ref 0.60–0.93)
Glucose, Bld: 129 mg/dL — ABNORMAL HIGH (ref 65–99)
Potassium: 3.9 mmol/L (ref 3.5–5.3)
Sodium: 138 mmol/L (ref 135–146)

## 2019-10-07 LAB — SPECIMEN COMPROMISED

## 2019-10-08 ENCOUNTER — Other Ambulatory Visit: Payer: Self-pay

## 2019-10-08 ENCOUNTER — Ambulatory Visit: Payer: Medicare Other

## 2019-10-08 ENCOUNTER — Ambulatory Visit
Admission: RE | Admit: 2019-10-08 | Discharge: 2019-10-08 | Disposition: A | Discharge status: Home / Self Care | Payer: Medicare Other | Source: Ambulatory Visit | Attending: Internal Medicine | Admitting: Internal Medicine

## 2019-10-08 ENCOUNTER — Other Ambulatory Visit: Payer: Self-pay | Admitting: Internal Medicine

## 2019-10-08 DIAGNOSIS — R944 Abnormal results of kidney function studies: Secondary | ICD-10-CM

## 2019-10-08 DIAGNOSIS — R928 Other abnormal and inconclusive findings on diagnostic imaging of breast: Secondary | ICD-10-CM

## 2019-11-08 ENCOUNTER — Other Ambulatory Visit (INDEPENDENT_AMBULATORY_CARE_PROVIDER_SITE_OTHER): Payer: Medicare Other

## 2019-11-08 ENCOUNTER — Ambulatory Visit (INDEPENDENT_AMBULATORY_CARE_PROVIDER_SITE_OTHER): Payer: Medicare Other

## 2019-11-08 ENCOUNTER — Other Ambulatory Visit: Payer: Self-pay

## 2019-11-08 ENCOUNTER — Ambulatory Visit (INDEPENDENT_AMBULATORY_CARE_PROVIDER_SITE_OTHER): Payer: Medicare Other | Admitting: Family Medicine

## 2019-11-08 ENCOUNTER — Encounter: Payer: Self-pay | Admitting: Family Medicine

## 2019-11-08 VITALS — BP 120/84 | HR 94 | Temp 98.3°F | Ht 60.0 in | Wt 134.1 lb

## 2019-11-08 DIAGNOSIS — M25531 Pain in right wrist: Secondary | ICD-10-CM | POA: Diagnosis not present

## 2019-11-08 DIAGNOSIS — R944 Abnormal results of kidney function studies: Secondary | ICD-10-CM | POA: Diagnosis not present

## 2019-11-08 NOTE — Progress Notes (Signed)
Established Patient Office Visit  Subjective:  Patient ID: [name], female    DOB: [birth year]  Age: 74 y.o. MRN: [id number]  CC:  Chief Complaint  Patient presents with  . Wrist Pain    started last saturday , no injury , wearing brace     HPI [name] presents for right wrist pain.  This started about 9 days ago.  She was down in [place] visiting family.  She does not recall any injury.  No recent falls.  Her location is somewhat generalized but tends to be more ulnar.  She has pain occasionally with lifting.  She is not noted any warmth or any visible swelling or redness.  No ecchymosis.  She has been using a nonrigid wrist brace.  She has not tried any icing.  She avoids nonsteroidals.  She has history of Crohn's disease.  No generalized arthralgias  Past Medical History:  Diagnosis Date  . Allergies   . Bruises easily   . Complication of anesthesia   . Crohn's disease ([place])    GI Dr Beulah Gandy in [place], [place]   . Deviated septum   . GERD (gastroesophageal reflux disease)   . Headache    occ   . Hiatal hernia   . HTN (hypertension)    pcp Dr Margaretann Loveless  in [place], [place]   . Neuropathy    fingers,periodically   . Osteoarthritis   . Pancreatic insufficiency   . PONV (postoperative nausea and vomiting)   . Rheumatoid arthritis ([place])   . Scoliosis   . Squamous cell carcinoma of arm    left   . Whooping cough 2015  . Whooping cough with pneumonia    2017    Past Surgical History:  Procedure Laterality Date  . CATARACT EXTRACTION, BILATERAL  2018   with IOC implant   . COLONOSCOPY    . DEBRIDEMENT AND CLOSURE WOUND Right 10/01/2018   Procedure: Excision of right knee wound with placement of ACell and Pravena;  Surgeon: Wallace Going, DO;  Location: [facility];  Service: Plastics;  Laterality: Right;  30 min  . JOINT REPLACEMENT  2020   right knee  . knee fracture     . NASAL FRACTURE SURGERY     needed b/c  she was getting frequent sinus infections   . 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 TOTAL KNEE ARTHROPLASTY Left 03/02/2019   Procedure: TOTAL KNEE ARTHROPLASTY;  Surgeon: Paralee Cancel, MD;  Location: [facility];  Service: Orthopedics;  Laterality: Left;  70 mins  . UPPER GASTROINTESTINAL ENDOSCOPY      Family History  Problem Relation Age of Onset  . Diabetes Father   . Asthma Neg Hx     Social History   Socioeconomic History  . Marital status: Married    Spouse name: Not on file  . Number of children: Not on file  . Years of education: Not on file  . Highest education level: Not on file  Occupational History  . Not on file  Tobacco Use  . Smoking status: Former Smoker    Packs/day: 1.00    Years: 30.00    Pack years: 30.00    Types: Cigarettes    Quit date: 1990    Years since quitting: 31.8  . Smokeless tobacco: Never Used  Vaping Use  .  Vaping Use: Never used  Substance and Sexual Activity  . Alcohol use: Not Currently  . Drug use: Never  . Sexual activity: Not on file  Other Topics Concern  . Not on file  Social History Narrative  . Not on file   Social Determinants of Health   Financial Resource Strain:   . Difficulty of Paying Living Expenses: Not on file  Food Insecurity:   . Worried About Charity fundraiser in the Last Year: Not on file  . Ran Out of Food in the Last Year: Not on file  Transportation Needs:   . Lack of Transportation (Medical): Not on file  . Lack of Transportation (Non-Medical): Not on file  Physical Activity:   . Days of Exercise per Week: Not on file  . Minutes of Exercise per Session: Not on file  Stress:   . Feeling of Stress : Not on file  Social Connections:   . Frequency of Communication with Friends and Family: Not on file  . Frequency of Social Gatherings with Friends and Family: Not on file    . Attends Religious Services: Not on file  . Active Member of Clubs or Organizations: Not on file  . Attends Archivist Meetings: Not on file  . Marital Status: Not on file  Intimate Partner Violence:   . Fear of Current or Ex-Partner: Not on file  . Emotionally Abused: Not on file  . Physically Abused: Not on file  . Sexually Abused: Not on file    Outpatient Medications Prior to Visit  Medication Sig Dispense Refill  . Calcium Citrate-Vitamin D (CALCIUM + D PO) Take 1 tablet by mouth 2 (two) times daily.    . cetirizine (ZYRTEC) 10 MG tablet Take 1 tablet (10 mg total) by mouth daily. 90 tablet 1  . dicyclomine (BENTYL) 20 MG tablet TAKE 1 TABLET (20 MG TOTAL) BY MOUTH 4 (FOUR) TIMES DAILY AS NEEDED FOR SPASMS. 360 tablet 1  . docusate sodium (COLACE) 100 MG capsule Take 1 capsule (100 mg total) by mouth 2 (two) times daily. 28 capsule 0  . DULoxetine (CYMBALTA) 60 MG capsule Take 1 capsule (60 mg total) by mouth 2 (two) times daily. 180 capsule 1  . fluticasone (FLONASE) 50 MCG/ACT nasal spray SPRAY 2 SPRAYS INTO EACH NOSTRIL EVERY DAY 48 mL 1  . lipase/protease/amylase (CREON) 12000 units CPEP capsule Take 2 capsules (24,000 Units total) by mouth 3 (three) times daily with meals. 540 capsule 1  . loperamide (IMODIUM) 2 MG capsule Take 1 capsule (2 mg total) by mouth 3 (three) times daily as needed for diarrhea or loose stools. 90 capsule 2  . losartan (COZAAR) 100 MG tablet Take 1 tablet (100 mg total) by mouth daily. 90 tablet 1  . Misc Natural Product Nasal (NASAL CLEANSE RINSE MIX NA) Place 1 Dose into the nose daily as needed (congestion).    . Multiple Vitamin (MULTIVITAMIN WITH MINERALS) TABS tablet Take 1 tablet by mouth daily.    [place] Kitchen omeprazole (PRILOSEC) 40 MG capsule Take 1 capsule (40 mg total) by mouth 2 (two) times daily. 180 capsule 1  . ondansetron (ZOFRAN) 4 MG tablet Take 1 tablet (4 mg total) by mouth every 8 (eight) hours as needed for nausea or vomiting. 90  tablet 1  . Pancrelipase, Lip-Prot-Amyl, (CREON) 24000-76000 units CPEP Take 2 capsules with each meal (3 times a day) (Patient taking differently: Take 2 capsules by mouth 3 (three) times daily with meals. ) 180  capsule 3  . polyethylene glycol (MIRALAX / GLYCOLAX) 17 g packet Take 17 g by mouth 2 (two) times daily. 28 packet 0  . triamcinolone cream (KENALOG) 0.1 % Apply 1 application topically 2 (two) times daily. 80 g 2   No facility-administered medications prior to visit.    Allergies  Allergen Reactions  . Adhesive [Tape]     Redness and skin peeling  . Aspirin     Intestinal Bleeding  . Ciprofloxacin Nausea And Vomiting  . Codeine Nausea And Vomiting  . Demerol [Meperidine Hcl] Nausea And Vomiting  . Lactose Intolerance (Gi) Other (See Comments)    Pt has Crohn's   . Latex     Redness and skin peeling  . Other     Nuts-itching in throat  Seeds-stomach issues with chrons    . Septra [Sulfamethoxazole-Trimethoprim] Nausea Only    ROS Review of Systems  Constitutional: Negative for chills and fever.      Objective:    Physical Exam Vitals reviewed.  Constitutional:      Appearance: Normal appearance.  Cardiovascular:     Rate and Rhythm: Normal rate and regular rhythm.  Musculoskeletal:     Comments: Right wrist reveals no visible swelling.  No warmth.  No erythema.  No ecchymosis.  No localized bony tenderness-other than mild distal ulnar tenderness.  Good range of motion.  Neurological:     Mental Status: She is alert.     BP 120/84 (BP Location: Left Arm, Patient Position: Sitting, Cuff Size: Normal)   Pulse 94   Temp 98.3 F (36.8 C) (Oral)   Ht 5' (1.524 m)   Wt 134 lb 1.6 oz (60.8 kg)   SpO2 94%   BMI 26.19 kg/m  Wt Readings from Last 3 Encounters:  11/08/19 134 lb 1.6 oz (60.8 kg)  09/16/19 136 lb 6.4 oz (61.9 kg)  06/08/19 132 lb (59.9 kg)     Health Maintenance Due  Topic Date Due  . Hepatitis C Screening  Never done  . TETANUS/TDAP   Never done  . COLONOSCOPY  Never done  . DEXA SCAN  Never done    There are no preventive care reminders to display for this patient.  No results found for: TSH Lab Results  Component Value Date   WBC 8.0 09/16/2019   HGB 13.5 09/16/2019   HCT 40.9 09/16/2019   MCV 90.3 09/16/2019   PLT 363 09/16/2019   Lab Results  Component Value Date   NA 138 10/06/2019   K 3.9 10/06/2019   CO2 25 10/06/2019   GLUCOSE 129 (H) 10/06/2019   BUN 25 10/06/2019   CREATININE 1.17 (H) 10/06/2019   BILITOT 0.5 09/16/2019   ALKPHOS 97 02/16/2019   AST 17 09/16/2019   ALT 10 09/16/2019   PROT 7.4 09/16/2019   ALBUMIN 4.3 02/16/2019   ALBUMIN 4.3 02/16/2019   CALCIUM 9.8 10/06/2019   ANIONGAP 10 03/04/2019   GFR 52.43 (L) 02/16/2019   Lab Results  Component Value Date   CHOL 223 (H) 09/16/2019   Lab Results  Component Value Date   HDL 54 09/16/2019   Lab Results  Component Value Date   LDLCALC 143 (H) 09/16/2019   Lab Results  Component Value Date   TRIG 140 09/16/2019   Lab Results  Component Value Date   CHOLHDL 4.1 09/16/2019   Lab Results  Component Value Date   HGBA1C 5.8 02/16/2019      Assessment & Plan:   Problem List  Items Addressed This Visit    None    Visit Diagnoses    Right wrist pain    -  Primary   Relevant Orders   DG Wrist Complete Right    Patient presents with almost 2-week history of nonspecific right ulnar wrist pain.  No reported injury.  -Patient requesting x-ray and will start with plain x-ray.  If negative recommend trial of some icing 20 minutes 3 times daily and consider more rigid wrist brace.  She may also wish to try some topical diclofenac  No orders of the defined types were placed in this encounter.   Follow-up: No follow-ups on file.    Carolann Littler, MD

## 2019-11-08 NOTE — Patient Instructions (Signed)
We will call with X-ray results  Consider more rigid wrist brace  Consider icing 20 minutes a few times daily and topical diclofenac.

## 2019-11-09 LAB — BASIC METABOLIC PANEL
BUN/Creatinine Ratio: 23 (calc) — ABNORMAL HIGH (ref 6–22)
BUN: 25 mg/dL (ref 7–25)
CO2: 27 mmol/L (ref 20–32)
Calcium: 9.6 mg/dL (ref 8.6–10.4)
Chloride: 102 mmol/L (ref 98–110)
Creat: 1.1 mg/dL — ABNORMAL HIGH (ref 0.60–0.93)
Glucose, Bld: 123 mg/dL — ABNORMAL HIGH (ref 65–99)
Potassium: 4.3 mmol/L (ref 3.5–5.3)
Sodium: 138 mmol/L (ref 135–146)

## 2019-11-11 ENCOUNTER — Other Ambulatory Visit: Payer: Self-pay | Admitting: *Deleted

## 2019-11-11 DIAGNOSIS — K219 Gastro-esophageal reflux disease without esophagitis: Secondary | ICD-10-CM

## 2019-11-11 MED ORDER — OMEPRAZOLE 40 MG PO CPDR: 40.0000 mg | DELAYED_RELEASE_CAPSULE | Freq: Two times a day (BID) | ORAL | 1 refills | Status: DC

## 2019-12-05 ENCOUNTER — Other Ambulatory Visit: Payer: Self-pay | Admitting: Internal Medicine

## 2019-12-14 ENCOUNTER — Other Ambulatory Visit: Payer: Self-pay

## 2019-12-14 ENCOUNTER — Telehealth (INDEPENDENT_AMBULATORY_CARE_PROVIDER_SITE_OTHER): Payer: Medicare Other | Admitting: Family Medicine

## 2019-12-14 DIAGNOSIS — R0981 Nasal congestion: Secondary | ICD-10-CM | POA: Diagnosis not present

## 2019-12-14 DIAGNOSIS — R059 Cough, unspecified: Secondary | ICD-10-CM

## 2019-12-14 MED ORDER — DOXYCYCLINE HYCLATE 100 MG PO TABS: 100.0000 mg | ORAL_TABLET | Freq: Two times a day (BID) | ORAL | 0 refills | Status: DC

## 2019-12-14 NOTE — Progress Notes (Signed)
Virtual Visit via Telephone Note  I connected with [name] on 12/14/19 at  4:20 PM EST by telephone and verified that I am speaking with the correct person using two identifiers.   I discussed the limitations, risks, security and privacy concerns of performing an evaluation and management service by telephone and the availability of in person appointments. I also discussed with the patient that there may be a patient responsible charge related to this service. The patient expressed understanding and agreed to proceed.  Location patient: home, [place] Location provider: work or home office Participants present for the call: patient, provider Patient did not have a visit with me in the prior 7 days to address this/these issue(s).   History of Present Illness:  Acute telemedicine visit for sinus congestion and cough: -Onset: several weeks, but worse the last 3-4 days -Symptoms include: cough, hoarseness, sinus congestion/drainage, green mucus, sinus pain -Denies:fevers, SOB, CP, body aches, vomiting, change in bowels -Has tried: nothing, zyrtec and flonase -Pertinent past medical history:  -Pertinent medication allergies:cipro, sulfa -COVID-19 vaccine status: fully vaccinated for covid   Observations/Objective: Patient sounds cheerful and well on the phone. I do not appreciate any SOB. Speech and thought processing are grossly intact. Patient reported vitals:  Assessment and Plan:  Sinus congestion  Cough  -this patient was very frustrated as she was told by her PCP office that she could not be seen inperson due to her symptoms.  -I offered to try to help her via the telemedicine visit or help her to find an options for inperson care. She opted to try the telemedicine visit. Her video visit failed and we completed the visit over the phone. -we discussed possible serious and likely etiologies, options for evaluation and workup, limitations of telemedicine visit vs in person visit,  treatment, treatment risks and precautions. Pt prefers to treat via telemedicine empirically rather than in person at this moment. Query sinusitis vs other. Given duration of symptoms she opted for empiric tx with doxy 129m bid x 10 days along with continuation of allergy regimen and nasal saline. She declined cough medication. Also discussed options fo covid testing.   Advised to seek prompt in person care if worsening, new symptoms arise, or if is not improving with treatment. Advised of options for inperson care in case PCP office not available. Did let the patient know that I only do telemedicine shifts for [facility] on Tuesdays and Thursdays and advised a follow [facility] visit with [facility]CP [facility] or [facility].   Follow Up Instructions:  I did not refer this patient for an OV with me in the next 24 hours for this/these issue(s).  I discussed the assessment and treatment plan with the patient. The patient was provided an opportunity to ask questions and all were answered. The patient agreed with the plan and demonstrated an understanding of the instructions.   I spent 19 minutes on this encounter.   HLucretia Kern DO

## 2019-12-14 NOTE — Patient Instructions (Addendum)
-  I sent the medication(s) we discussed to your pharmacy: Meds ordered this encounter  Medications  . doxycycline (VIBRA-TABS) 100 MG tablet    Sig: Take 1 tablet (100 mg total) by mouth 2 (two) times daily.    Dispense:  20 tablet    Refill:  0     I hope you are feeling better soon!  Seek in person care promptly if your symptoms worsen, new concerns arise or you are not improving with treatment.  It was nice to meet you today. I help [facility] out with telemedicine visits on Tuesdays and Thursdays and am available for visits on those days. If you have any concerns or questions following this visit please schedule a follow up visit with your Primary Care doctor or seek care at a local urgent care clinic to avoid delays in care.

## 2020-01-11 ENCOUNTER — Ambulatory Visit (INDEPENDENT_AMBULATORY_CARE_PROVIDER_SITE_OTHER): Payer: Medicare Other | Admitting: Internal Medicine

## 2020-01-11 ENCOUNTER — Encounter: Payer: Self-pay | Admitting: Internal Medicine

## 2020-01-11 ENCOUNTER — Other Ambulatory Visit: Payer: Self-pay

## 2020-01-11 VITALS — BP 135/85 | HR 72 | Temp 98.1°F | Wt 137.2 lb

## 2020-01-11 DIAGNOSIS — M25531 Pain in right wrist: Secondary | ICD-10-CM | POA: Diagnosis not present

## 2020-01-11 DIAGNOSIS — K219 Gastro-esophageal reflux disease without esophagitis: Secondary | ICD-10-CM

## 2020-01-11 NOTE — Progress Notes (Signed)
Acute office Visit     This visit occurred during the SARS-CoV-2 public health emergency.  Safety protocols were in place, including screening questions prior to the visit, additional usage of staff PPE, and extensive cleaning of exam room while observing appropriate contact time as indicated for disinfecting solutions.    CC/Reason for Visit: Right wrist pain and worsening chronic cough  HPI: [name] is a 75 y.o. female who is coming in today for the above mentioned reasons.  She is here today to discuss 2 issues:  1.  She has been having right wrist pain and swelling now for about 6 weeks.  She saw another provider in this office, x-ray was ordered and she was found to have some arthritic changes and soft tissue swelling.  She then saw an emergency Ortho clinic who gave her a prednisone taper and a brace.  Pain improved while on prednisone but has worsened since she discontinued it.  Writing and typing cause extreme pain.  She is wondering what can be done.  2.  She is again having issues with a worsening chronic cough.  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  She has been taking omeprazole daily.  She has seen GI in the past who has recommended repair of hiatal hernia.  About 4 weeks ago she saw a provider virtually who recommended of course of doxycycline.  She feels that this helped somewhat but not completely.  She is having hoarseness of her voice.  She denies chest pain or classic dyspepsia symptoms.  No other URI symptoms.   Past Medical/Surgical History: Past Medical History:  Diagnosis Date  . Allergies   . Bruises easily   . Complication of anesthesia   . Crohn's disease ([place])    GI Dr Beulah Gandy in [place], [place]   . Deviated septum   . GERD (gastroesophageal reflux disease)   . Headache    occ   . Hiatal hernia   . HTN (hypertension)    pcp Dr Margaretann Loveless  in  [place], [place]   . Neuropathy    fingers,periodically   . Osteoarthritis   . Pancreatic insufficiency   . PONV (postoperative nausea and vomiting)   . Rheumatoid arthritis ([place])   . Scoliosis   . Squamous cell carcinoma of arm    left   . Whooping cough 2015  . Whooping cough with pneumonia    2017    Past Surgical History:  Procedure Laterality Date  . CATARACT EXTRACTION, BILATERAL  2018   with IOC implant   . COLONOSCOPY    . DEBRIDEMENT AND CLOSURE WOUND Right 10/01/2018   Procedure: Excision of right knee wound with placement of ACell and Pravena;  Surgeon: Wallace Going, DO;  Location: [facility];  Service: Plastics;  Laterality: Right;  30 min  . JOINT REPLACEMENT  2020   right knee  . knee fracture     . NASAL FRACTURE SURGERY     needed b/c she was getting frequent sinus infections   . 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 TOTAL KNEE ARTHROPLASTY Left 03/02/2019   Procedure: TOTAL KNEE ARTHROPLASTY;  Surgeon: Paralee Cancel, MD;  [facility];  Service: Orthopedics;  Laterality: Left;  70 mins  .  UPPER GASTROINTESTINAL ENDOSCOPY      Social History:  reports that she quit smoking about 32 years ago. Her smoking use included cigarettes. She has a 30.00 pack-year smoking history. She has never used smokeless tobacco. She reports previous alcohol use. She reports that she does not use drugs.  Allergies: Allergies  Allergen Reactions  . Adhesive [Tape]     Redness and skin peeling  . Aspirin     Intestinal Bleeding  . Ciprofloxacin Nausea And Vomiting  . Codeine Nausea And Vomiting  . Demerol [Meperidine Hcl] Nausea And Vomiting  . Lactose Intolerance (Gi) Other (See Comments)    Pt has Crohn's   . Latex     Redness and skin peeling  . Other     Nuts-itching in throat  Seeds-stomach issues with  chrons    . Septra [Sulfamethoxazole-Trimethoprim] Nausea Only    Family History:  Family History  Problem Relation Age of Onset  . Diabetes Father   . Asthma Neg Hx      Current Outpatient Medications:  .  Calcium Citrate-Vitamin D (CALCIUM + D PO), Take 1 tablet by mouth 2 (two) times daily., Disp: , Rfl:  .  cetirizine (ZYRTEC) 10 MG tablet, Take 1 tablet (10 mg total) by mouth daily., Disp: 90 tablet, Rfl: 1 .  dicyclomine (BENTYL) 20 MG tablet, TAKE 1 TABLET (20 MG TOTAL) BY MOUTH 4 (FOUR) TIMES DAILY AS NEEDED FOR SPASMS., Disp: 360 tablet, Rfl: 1 .  docusate sodium (COLACE) 100 MG capsule, Take 1 capsule (100 mg total) by mouth 2 (two) times daily., Disp: 28 capsule, Rfl: 0 .  doxycycline (VIBRA-TABS) 100 MG tablet, Take 1 tablet (100 mg total) by mouth 2 (two) times daily., Disp: 20 tablet, Rfl: 0 .  DULoxetine (CYMBALTA) 60 MG capsule, Take 1 capsule (60 mg total) by mouth 2 (two) times daily., Disp: 180 capsule, Rfl: 1 .  fluticasone (FLONASE) 50 MCG/ACT nasal spray, SPRAY 2 SPRAYS INTO EACH NOSTRIL EVERY DAY, Disp: 48 mL, Rfl: 1 .  lipase/protease/amylase (CREON) 12000 units CPEP capsule, Take 2 capsules (24,000 Units total) by mouth 3 (three) times daily with meals., Disp: 540 capsule, Rfl: 1 .  loperamide (IMODIUM) 2 MG capsule, Take 1 capsule (2 mg total) by mouth 3 (three) times daily as needed for diarrhea or loose stools., Disp: 90 capsule, Rfl: 2 .  losartan (COZAAR) 100 MG tablet, Take 1 tablet (100 mg total) by mouth daily., Disp: 90 tablet, Rfl: 1 .  Misc Natural Product Nasal (NASAL CLEANSE RINSE MIX NA), Place 1 Dose into the nose daily as needed (congestion)., Disp: , Rfl:  .  Multiple Vitamin (MULTIVITAMIN WITH MINERALS) TABS tablet, Take 1 tablet by mouth daily., Disp: , Rfl:  .  omeprazole (PRILOSEC) 40 MG capsule, Take 1 capsule (40 mg total) by mouth 2 (two) times daily., Disp: 180 capsule, Rfl: 1 .  ondansetron (ZOFRAN) 4 MG tablet, Take 1 tablet (4 mg total)  by mouth every 8 (eight) hours as needed for nausea or vomiting., Disp: 90 tablet, Rfl: 1 .  Pancrelipase, Lip-Prot-Amyl, (CREON) 24000-76000 units CPEP, Take 2 capsules with each meal (3 times a day) (Patient taking differently: Take 2 capsules by mouth 3 (three) times daily with meals.), Disp: 180 capsule, Rfl: 3 .  polyethylene glycol (MIRALAX / GLYCOLAX) 17 g packet, Take 17 g by mouth 2 (two) times daily., Disp: 28 packet, Rfl: 0 .  triamcinolone cream (KENALOG) 0.1 %, Apply 1 application topically 2 (two) times daily.,  Disp: 80 g, Rfl: 2  Review of Systems:  Constitutional: Denies fever, chills, diaphoresis, appetite change and fatigue.  HEENT: Denies photophobia, eye pain, redness, hearing loss, ear pain, congestion, sore throat, rhinorrhea, sneezing, mouth sores, trouble swallowing, neck pain, neck stiffness and tinnitus.   Respiratory: Denies SOB, DOE,  chest tightness.   Cardiovascular: Denies chest pain, palpitations and leg swelling.  Gastrointestinal: Denies nausea, vomiting, abdominal pain, diarrhea, constipation, blood in stool and abdominal distention.  Genitourinary: Denies dysuria, urgency, frequency, hematuria, flank pain and difficulty urinating.  Endocrine: Denies: hot or cold intolerance, sweats, changes in hair or nails, polyuria, polydipsia. Musculoskeletal: Denies myalgias, back pain, joint swelling, arthralgias and gait problem.  Skin: Denies pallor, rash and wound.  Neurological: Denies dizziness, seizures, syncope, weakness, light-headedness, numbness and headaches.  Hematological: Denies adenopathy. Easy bruising, personal or family bleeding history  Psychiatric/Behavioral: Denies suicidal ideation, mood changes, confusion, nervousness, sleep disturbance and agitation    Physical Exam: Vitals:   01/11/20 0824  BP: 135/85  Pulse: 72  Temp: 98.1 F (36.7 C)  TempSrc: Oral  SpO2: 94%  Weight: 137 lb 3.2 oz (62.2 kg)    Body mass index is 26.8  kg/m.   Constitutional: NAD, calm, comfortable Eyes: PERRL, lids and conjunctivae normal, wears corrective lenses ENMT: Mucous membranes are moist.  Respiratory: clear to auscultation bilaterally, no wheezing, no crackles. Normal respiratory effort. No accessory muscle use.  Cardiovascular: Regular rate and rhythm, no murmurs / rubs / gallops. No extremity edema Musculoskeletal: Right wrist is slightly swollen and tender to touch.  No obvious erythema. Neurologic: Grossly intact and nonfocal Psychiatric: Normal judgment and insight. Alert and oriented x 3. Normal mood.    Impression and Plan:  Right wrist pain  - Plan: AMB referral to orthopedics -Have advised she continue bracing and icing until then.  Gastroesophageal reflux disease without esophagitis -I suspect that her chronic cough is related to her chronic reflux from her significant hiatal hernia. -I have advised that she increase her PPI to twice daily and that she see GI again for further recommendations.  It might be time to repeat her upper endoscopy.   Patient Instructions  -Nice seeing you today!!  -Referrals to GI and ortho today.     Lelon Frohlich, MD [facility]

## 2020-01-11 NOTE — Patient Instructions (Signed)
-  Nice seeing you today!!  -Referrals to GI and ortho today.

## 2020-01-13 ENCOUNTER — Encounter: Payer: Self-pay | Admitting: Physician Assistant

## 2020-01-13 ENCOUNTER — Ambulatory Visit (INDEPENDENT_AMBULATORY_CARE_PROVIDER_SITE_OTHER): Payer: Medicare Other | Admitting: Orthopaedic Surgery

## 2020-01-13 VITALS — Ht 60.0 in | Wt 137.2 lb

## 2020-01-13 DIAGNOSIS — M25531 Pain in right wrist: Secondary | ICD-10-CM

## 2020-01-13 NOTE — Progress Notes (Unsigned)
Office Visit Note   Patient: [name]           Date of Birth: 1945-12-17           MRN: [id number] Visit Date: 01/13/2020              Requested by: Isaac Bliss, Rayford Halsted, MD [place],  [place] [address] PCP: Isaac Bliss, Rayford Halsted, MD   Assessment & Plan: Visit Diagnoses:  1. Pain in right wrist     Plan: Impression is right wrist TFCC tear.  At this point, we will order an MR arthrogram of the right wrist to further assess for this.  She will follow-up with [place] once completed.  Follow-Up Instructions: Return for after MR arthrogram right wrist.   Orders:  No orders of the defined types were placed in this encounter.  No orders of the defined types were placed in this encounter.     Procedures: No procedures performed   Clinical Data: No additional findings.   Subjective: Chief Complaint  Patient presents with  . Right Wrist - Pain    HPI patient is a very pleasant 75 year old right-hand-dominant female who comes in today with right wrist pain for about a month or so.  No known injury or change in activity.  She does note that her pain has worsened over the past month.  She has pain to the entire wrist worse when she is riding.  She has been wearing a wrist splint which does seem to help at times.  She takes Tylenol and uses Voltaren gel which minimally helps.  She is unable to take NSAIDs due to underlying Crohn's.  She was recently put on methylprednisolone, however for another issue which did seem to help her wrist pain.  We will  Review of Systems as detailed in HPI.  All others reviewed and are negative.   Objective: Vital Signs: Ht 5' (1.524 m)   Wt 137 lb 3.2 oz (62.2 kg)   BMI 26.80 kg/m   Physical Exam well-developed well-nourished female no acute distress.  Alert and orient x3.  Ortho Exam right wrist exam shows no tenderness to the distal radius.  She does have tenderness distal to the ulnar styloid.  She does have  increased pain with compression testing of the TFCC.  He denies any pain with flexion or extension of the wrist.  She is neurovascular intact distally.  Specialty Comments:  No specialty comments available.  Imaging: X-rays reviewed by me in canopy show no acute or structural abnormalities   PMFS History: Patient Active Problem List   Diagnosis Date Noted  . Hyperlipidemia 09/17/2019  . Status post total left knee replacement 03/02/2019  . Open wound of right knee 09/18/2018  . Overweight (BMI 25.0-29.9) 07/29/2018  . S/P right TKA 07/28/2018   Past Medical History:  Diagnosis Date  . Allergies   . Bruises easily   . Complication of anesthesia   . Crohn's disease ([place])    GI Dr Beulah Gandy in [place], [place]   . Deviated septum   . GERD (gastroesophageal reflux disease)   . Headache    occ   . Hiatal hernia   . HTN (hypertension)    pcp Dr Margaretann Loveless  in [place], [place]   . Neuropathy    fingers,periodically   . Osteoarthritis   . Pancreatic insufficiency   . PONV (postoperative nausea and vomiting)   . Rheumatoid arthritis ([place])   . Scoliosis   .  Squamous cell carcinoma of arm    left   . Whooping cough 2015  . Whooping cough with pneumonia    2017    Family History  Problem Relation Age of Onset  . Diabetes Father   . Asthma Neg Hx     Past Surgical History:  Procedure Laterality Date  . CATARACT EXTRACTION, BILATERAL  2018   with IOC implant   . COLONOSCOPY    . DEBRIDEMENT AND CLOSURE WOUND Right 10/01/2018   Procedure: Excision of right knee wound with placement of ACell and Pravena;  Surgeon: Wallace Going, DO;  Location: [facility];  Service: Plastics;  Laterality: Right;  30 min  . JOINT REPLACEMENT  2020   right knee  . knee fracture     . NASAL FRACTURE SURGERY     needed b/c she was getting frequent sinus infections   . TOTAL KNEE ARTHROPLASTY Right 07/28/2018   Procedure: TOTAL KNEE ARTHROPLASTY;  Surgeon:  Paralee Cancel, MD;  Location: [facility];  Service: Orthopedics;  Laterality: Right;  . TOTAL KNEE ARTHROPLASTY Left 03/02/2019   Procedure: TOTAL KNEE ARTHROPLASTY;  Surgeon: Paralee Cancel, MD;  Location: [facility];  Service: Orthopedics;  Laterality: Left;  70 mins  . UPPER GASTROINTESTINAL ENDOSCOPY     Social History   Occupational History  . Not on file  Tobacco Use  . Smoking status: Former Smoker    Packs/day: 1.00    Years: 30.00    Pack years: 30.00    Types: Cigarettes    Quit date: 1990    Years since quitting: 32.0  . Smokeless tobacco: Never Used  Vaping Use  . Vaping Use: Never used  Substance and Sexual Activity  . Alcohol use: Not Currently  . Drug use: Never  . Sexual activity: Not on file

## 2020-02-02 ENCOUNTER — Ambulatory Visit
Admission: RE | Admit: 2020-02-02 | Discharge: 2020-02-02 | Disposition: A | Discharge status: Home / Self Care | Payer: Medicare Other | Source: Ambulatory Visit | Attending: Surgery | Admitting: Surgery

## 2020-02-02 ENCOUNTER — Other Ambulatory Visit: Payer: Self-pay

## 2020-02-02 DIAGNOSIS — M25531 Pain in right wrist: Secondary | ICD-10-CM

## 2020-02-02 MED ORDER — IOPAMIDOL (ISOVUE-M 200) INJECTION 41%: 1.0000 mL | Freq: Once | INTRAMUSCULAR | Status: DC

## 2020-02-02 MED ORDER — IOPAMIDOL (ISOVUE-M 200) INJECTION 41%
3.0000 mL | Freq: Once | INTRAMUSCULAR | Status: AC
  Administered 2020-02-02: 3 mL via INTRA_ARTICULAR

## 2020-02-07 ENCOUNTER — Telehealth: Payer: Self-pay | Admitting: Internal Medicine

## 2020-02-07 NOTE — Telephone Encounter (Signed)
Inbound call from patient requesting refills for mesalamine and loperamide medications be sent to [facility] pharmacy in chart please.

## 2020-02-08 MED ORDER — MESALAMINE 1.2 G PO TBEC: 4.8000 g | DELAYED_RELEASE_TABLET | Freq: Every day | ORAL | 1 refills | Status: DC

## 2020-02-08 MED ORDER — LOPERAMIDE HCL 2 MG PO CAPS: 2.0000 mg | ORAL_CAPSULE | Freq: Three times a day (TID) | ORAL | 2 refills | Status: DC | PRN

## 2020-02-08 NOTE — Telephone Encounter (Signed)
Refilled Lomotil and Lialda

## 2020-02-16 ENCOUNTER — Encounter: Payer: Self-pay | Admitting: Orthopaedic Surgery

## 2020-02-16 ENCOUNTER — Ambulatory Visit (INDEPENDENT_AMBULATORY_CARE_PROVIDER_SITE_OTHER): Payer: Medicare Other | Admitting: Orthopaedic Surgery

## 2020-02-16 DIAGNOSIS — M25531 Pain in right wrist: Secondary | ICD-10-CM | POA: Diagnosis not present

## 2020-02-16 MED ORDER — HYDROCODONE-ACETAMINOPHEN 5-325 MG PO TABS
1.0000 | ORAL_TABLET | Freq: Two times a day (BID) | ORAL | 0 refills | Status: DC | PRN
Start: 2020-02-16 — End: 2020-10-04

## 2020-02-16 NOTE — Progress Notes (Signed)
Office Visit Note   Patient: [name]           Date of Birth: 20-Nov-1945           MRN: [id number] Visit Date: 02/16/2020              Requested by: Isaac Bliss, Rayford Halsted, MD [place],  [place] [id number] PCP: Isaac Bliss, Rayford Halsted, MD   Assessment & Plan: Visit Diagnoses:  1. 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  I believe the majority the patient's symptoms are coming from her TFCC tear.  We have discussed treatment options to include cortisone injection.  She notes that she had significant pain following the injection after the MR arthrogram and she is not interested in wrist injection at this point.  We are unable to start her on anti-inflammatories as she has a history of Crohn's disease.  She has recently been on prednisone so we do not want to restart that either.  I have agreed to call in a small prescription of Norco.  If she decides to proceed with cortisone injection to the wrist, she will let us know.  Follow-Up Instructions: Return if symptoms worsen or fail to improve.   Orders:  No orders of the defined types were placed in this encounter.  Meds ordered this encounter  Medications  . HYDROcodone-acetaminophen (NORCO) 5-325 MG tablet    Sig: Take 1 tablet by mouth 2 (two) times daily as needed.    Dispense:  20 tablet    Refill:  0      Procedures: No procedures performed   Clinical Data: No additional findings.   Subjective: Chief Complaint  Patient presents with  . Right Wrist - Pain, Follow-up    HPI patient is a pleasant 75 year old female who comes in today to discuss MR arthrogram results of the right wrist.  She has had pain for over 2 months now without a specific injury or change in activity.  She does like to write and paint for which she has been unable to do  secondary to the pain.  Majority of her pain is to the ulnar aspect.  She has recently noticed intermittent pain to the dorsum of the forearm.  MR arthrogram from 02/03/2020 shows a complete tear of the scapholunate ligament with DISI deformity, central tear of the triangular fibrocartilage disc as well as distal intersection syndrome and mild wrist osteoarthritis.     Objective: Vital Signs: There were no vitals taken for this visit.    Ortho Exam stable wrist exam  Specialty Comments:  No specialty comments available.  Imaging: No new imaging   PMFS History: Patient Active Problem List   Diagnosis Date Noted  . Hyperlipidemia 09/17/2019  . Status post total left knee replacement 03/02/2019  . Open wound of right knee 09/18/2018  . Overweight (BMI 25.0-29.9) 07/29/2018  . S/P right TKA 07/28/2018   Past Medical History:  Diagnosis Date  . Allergies   . Bruises easily   . Complication of anesthesia   . Crohn's disease ([place])    GI Dr Beulah Gandy in [place], [place]   . Deviated septum   . GERD (gastroesophageal reflux disease)   . Headache    occ   . Hiatal hernia   . HTN (hypertension)    pcp Dr Margaretann Loveless  in [place], [place]   . Neuropathy    fingers,periodically   . Osteoarthritis   . Pancreatic insufficiency   . PONV (postoperative nausea and vomiting)   . Rheumatoid arthritis ([place])   . Scoliosis   . Squamous cell carcinoma of arm    left   . Whooping cough 2015  . Whooping cough with pneumonia    2017    Family History  Problem Relation Age of Onset  . Diabetes Father   . Asthma Neg Hx     Past Surgical History:  Procedure Laterality Date  . CATARACT EXTRACTION, BILATERAL  2018   with IOC implant   . COLONOSCOPY    . DEBRIDEMENT AND CLOSURE WOUND Right 10/01/2018   Procedure: Excision of right knee wound with placement of ACell and Pravena;  Surgeon: Wallace Going, DO;  Location: [facility];  Service: Plastics;   Laterality: Right;  30 min  . JOINT REPLACEMENT  2020   right knee  . knee fracture     . NASAL FRACTURE SURGERY     needed b/c she was getting frequent sinus infections   . 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 TOTAL KNEE ARTHROPLASTY Left 03/02/2019   Procedure: TOTAL KNEE ARTHROPLASTY;  Surgeon: Paralee Cancel, MD;  [facility];  Service: Orthopedics;  Laterality: Left;  70 mins  . UPPER GASTROINTESTINAL ENDOSCOPY     Social History   Occupational History  . Not on file  Tobacco Use  . Smoking status: Former Smoker    Packs/day: 1.00    Years: 30.00    Pack years: 30.00    Types: Cigarettes    Quit date: 1990    Years since quitting: 32.1  . Smokeless tobacco: Never Used  Vaping Use  . Vaping Use: Never used  Substance and Sexual Activity  . Alcohol use: Not Currently  . Drug use: Never  . Sexual activity: Not on file

## 2020-03-08 ENCOUNTER — Other Ambulatory Visit: Payer: Self-pay | Admitting: Internal Medicine

## 2020-03-14 ENCOUNTER — Other Ambulatory Visit: Payer: Self-pay | Admitting: Internal Medicine

## 2020-03-14 ENCOUNTER — Other Ambulatory Visit: Payer: Self-pay

## 2020-03-14 DIAGNOSIS — F339 Major depressive disorder, recurrent, unspecified: Secondary | ICD-10-CM

## 2020-03-15 ENCOUNTER — Telehealth: Payer: Self-pay | Admitting: Internal Medicine

## 2020-03-15 ENCOUNTER — Ambulatory Visit (INDEPENDENT_AMBULATORY_CARE_PROVIDER_SITE_OTHER): Payer: Medicare Other | Admitting: Internal Medicine

## 2020-03-15 ENCOUNTER — Encounter: Payer: Self-pay | Admitting: Internal Medicine

## 2020-03-15 VITALS — BP 140/80 | HR 84 | Temp 98.5°F | Wt 134.6 lb

## 2020-03-15 VITALS — BP 128/82 | HR 76 | Ht 60.0 in | Wt 134.4 lb

## 2020-03-15 DIAGNOSIS — F339 Major depressive disorder, recurrent, unspecified: Secondary | ICD-10-CM | POA: Diagnosis not present

## 2020-03-15 DIAGNOSIS — K5010 Crohn's disease of large intestine without complications: Secondary | ICD-10-CM

## 2020-03-15 DIAGNOSIS — K219 Gastro-esophageal reflux disease without esophagitis: Secondary | ICD-10-CM | POA: Diagnosis not present

## 2020-03-15 DIAGNOSIS — K449 Diaphragmatic hernia without obstruction or gangrene: Secondary | ICD-10-CM

## 2020-03-15 DIAGNOSIS — I10 Essential (primary) hypertension: Secondary | ICD-10-CM | POA: Diagnosis not present

## 2020-03-15 DIAGNOSIS — R110 Nausea: Secondary | ICD-10-CM

## 2020-03-15 DIAGNOSIS — J302 Other seasonal allergic rhinitis: Secondary | ICD-10-CM

## 2020-03-15 DIAGNOSIS — R251 Tremor, unspecified: Secondary | ICD-10-CM

## 2020-03-15 DIAGNOSIS — K591 Functional diarrhea: Secondary | ICD-10-CM

## 2020-03-15 DIAGNOSIS — E785 Hyperlipidemia, unspecified: Secondary | ICD-10-CM

## 2020-03-15 LAB — LIPID PANEL
Cholesterol: 186 mg/dL (ref 0–200)
HDL: 55 mg/dL (ref >39.00)
LDL Cholesterol: 106 mg/dL — ABNORMAL HIGH (ref 0–99)
NonHDL: 131.2
Total CHOL/HDL Ratio: 3
Triglycerides: 125 mg/dL (ref 0.0–149.0)
VLDL: 25 mg/dL (ref 0.0–40.0)

## 2020-03-15 MED ORDER — CETIRIZINE HCL 10 MG PO TABS: 10.0000 mg | ORAL_TABLET | Freq: Every day | ORAL | 1 refills | Status: DC

## 2020-03-15 MED ORDER — OMEPRAZOLE 40 MG PO CPDR: 40.0000 mg | DELAYED_RELEASE_CAPSULE | Freq: Two times a day (BID) | ORAL | 1 refills | Status: DC

## 2020-03-15 MED ORDER — DICYCLOMINE HCL 20 MG PO TABS: 20.0000 mg | ORAL_TABLET | Freq: Four times a day (QID) | ORAL | 1 refills | Status: DC | PRN

## 2020-03-15 MED ORDER — LOPERAMIDE HCL 2 MG PO CAPS: 2.0000 mg | ORAL_CAPSULE | Freq: Three times a day (TID) | ORAL | 3 refills | Status: DC | PRN

## 2020-03-15 MED ORDER — DICYCLOMINE HCL 20 MG PO TABS: 20.0000 mg | ORAL_TABLET | Freq: Four times a day (QID) | ORAL | 3 refills | Status: DC | PRN

## 2020-03-15 MED ORDER — FLUTICASONE PROPIONATE 50 MCG/ACT NA SUSP: NASAL | 1 refills | Status: DC

## 2020-03-15 MED ORDER — LOSARTAN POTASSIUM 100 MG PO TABS: 100.0000 mg | ORAL_TABLET | Freq: Every day | ORAL | 1 refills | Status: DC

## 2020-03-15 MED ORDER — MESALAMINE 1.2 G PO TBEC: 4.8000 g | DELAYED_RELEASE_TABLET | Freq: Every day | ORAL | 3 refills | Status: DC

## 2020-03-15 MED ORDER — LOPERAMIDE HCL 2 MG PO CAPS: 2.0000 mg | ORAL_CAPSULE | Freq: Three times a day (TID) | ORAL | 2 refills | Status: DC | PRN

## 2020-03-15 MED ORDER — DULOXETINE HCL 60 MG PO CPEP: 60.0000 mg | ORAL_CAPSULE | Freq: Two times a day (BID) | ORAL | 0 refills | Status: DC

## 2020-03-15 NOTE — Patient Instructions (Signed)
We have sent the following medications to your pharmacy for you to pick up at your convenience:  Dicyclomine, Imodium, Mesalamine, Omeprazole.  Please follow up in one year

## 2020-03-15 NOTE — Telephone Encounter (Signed)
Patient is calling and stated that she is returning the call, please advise.CB is [phone]

## 2020-03-15 NOTE — Progress Notes (Signed)
Established Patient Office Visit     This visit occurred during the SARS-CoV-2 public health emergency.  Safety protocols were in place, including screening questions prior to the visit, additional usage of staff PPE, and extensive cleaning of exam room while observing appropriate contact time as indicated for disinfecting solutions.    CC/Reason for Visit: Follow-up chronic medical conditions  HPI: [name] is a 75 y.o. female who is coming in today for the above mentioned reasons. Past Medical History is significant for: bilateral knee osteoarthritis status post bilateral TKR, Crohn's disease diagnosed in 1997 that has been in remission,a large hiatal hernia, hypertension that has been well controlled, pancreatic insufficiency for whichshe uses Creon with meals, significant scoliosis.   She was found to have an elevated cholesterol level during her last physical, has been trying lifestyle changes and is fasting today to recheck lipid panel.  She has been having some right wrist pain.  She went to Ortho and had an MRI was found to have a couple of tendon tears but she was told that no surgery was possible and advised to wear a brace and to do cortisone injections as needed.  She feels like ever since then and possibly even before that, her right hand has been shaking and she has noticed that she is dropping things constantly.   Past Medical/Surgical History: Past Medical History:  Diagnosis Date  . Allergies   . Bruises easily   . Complication of anesthesia   . Crohn's disease ([place])    GI Dr Beulah Gandy in [place], [place]   . Deviated septum   . GERD (gastroesophageal reflux disease)   . Headache    occ   . Hiatal hernia   . HTN (hypertension)    pcp Dr Margaretann Loveless  in [place], [place]   . Neuropathy    fingers,periodically   . Osteoarthritis   . Pancreatic insufficiency   . PONV (postoperative nausea and vomiting)   . Rheumatoid arthritis ([place])   .  Scoliosis   . Squamous cell carcinoma of arm    left   . Whooping cough 2015  . Whooping cough with pneumonia    2017    Past Surgical History:  Procedure Laterality Date  . CATARACT EXTRACTION, BILATERAL  2018   with IOC implant   . COLONOSCOPY    . DEBRIDEMENT AND CLOSURE WOUND Right 10/01/2018   Procedure: Excision of right knee wound with placement of ACell and Pravena;  Surgeon: Wallace Going, DO;  Location: [facility];  Service: Plastics;  Laterality: Right;  30 min  . JOINT REPLACEMENT  2020   right knee  . knee fracture     . NASAL FRACTURE SURGERY     needed b/c she was getting frequent sinus infections   . 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 TOTAL KNEE ARTHROPLASTY Left 03/02/2019   Procedure: TOTAL KNEE ARTHROPLASTY;  Surgeon: Paralee Cancel, MD;  Location[facility];  Service: Orthopedics;  Laterality: Left;  70 mins  . UPPER GASTROINTESTINAL ENDOSCOPY      Social History:  reports that she quit smoking about 32 years ago. Her smoking use included cigarettes. She has a 30.00 pack-year smoking history. She has never used smokeless tobacco. She reports previous alcohol use. She reports that she does not use drugs.  Allergies: Allergies  Allergen Reactions  . Adhesive [Tape]     Redness and skin peeling  . Aspirin     Intestinal Bleeding  . Ciprofloxacin Nausea And Vomiting  . Codeine Nausea And Vomiting  . Demerol [Meperidine Hcl] Nausea And Vomiting  . Lactose Intolerance (Gi) Other (See Comments)    Pt has Crohn's   . Latex     Redness and skin peeling  . Other     Nuts-itching in throat  Seeds-stomach issues with chrons    . Septra [Sulfamethoxazole-Trimethoprim] Nausea Only    Family History:  Family History  Problem Relation Age of Onset  . Diabetes Father   . Asthma Neg Hx      Current  Outpatient Medications:  .  Calcium Citrate-Vitamin D (CALCIUM + D PO), Take 1 tablet by mouth 2 (two) times daily., Disp: , Rfl:  .  docusate sodium (COLACE) 100 MG capsule, Take 1 capsule (100 mg total) by mouth 2 (two) times daily., Disp: 28 capsule, Rfl: 0 .  doxycycline (VIBRA-TABS) 100 MG tablet, Take 1 tablet (100 mg total) by mouth 2 (two) times daily., Disp: 20 tablet, Rfl: 0 .  HYDROcodone-acetaminophen (NORCO) 5-325 MG tablet, Take 1 tablet by mouth 2 (two) times daily as needed., Disp: 20 tablet, Rfl: 0 .  lipase/protease/amylase (CREON) 12000 units CPEP capsule, Take 2 capsules (24,000 Units total) by mouth 3 (three) times daily with meals., Disp: 540 capsule, Rfl: 1 .  mesalamine (LIALDA) 1.2 g EC tablet, Take 4 tablets (4.8 g total) by mouth daily with breakfast., Disp: 120 tablet, Rfl: 2 .  Misc Natural Product Nasal (NASAL CLEANSE RINSE MIX NA), Place 1 Dose into the nose daily as needed (congestion)., Disp: , Rfl:  .  Multiple Vitamin (MULTIVITAMIN WITH MINERALS) TABS tablet, Take 1 tablet by mouth daily., Disp: , Rfl:  .  Pancrelipase, Lip-Prot-Amyl, (CREON) 24000-76000 units CPEP, Take 2 capsules with each meal (3 times a day) (Patient taking differently: Take 2 capsules by mouth 3 (three) times daily with meals.), Disp: 180 capsule, Rfl: 3 .  polyethylene glycol (MIRALAX / GLYCOLAX) 17 g packet, Take 17 g by mouth 2 (two) times daily., Disp: 28 packet, Rfl: 0 .  triamcinolone cream (KENALOG) 0.1 %, Apply 1 application topically 2 (two) times daily., Disp: 80 g, Rfl: 2 .  cetirizine (ZYRTEC) 10 MG tablet, Take 1 tablet (10 mg total) by mouth daily., Disp: 90 tablet, Rfl: 1 .  dicyclomine (BENTYL) 20 MG tablet, Take 1 tablet (20 mg total) by mouth 4 (four) times daily as needed for spasms., Disp: 360 tablet, Rfl: 1 .  DULoxetine (CYMBALTA) 60 MG capsule, Take 1 capsule (60 mg total) by mouth 2 (two) times daily., Disp: 180 capsule, Rfl: 0 .  fluticasone (FLONASE) 50 MCG/ACT nasal  spray, SPRAY 2 SPRAYS INTO EACH NOSTRIL EVERY DAY, Disp: 48 mL, Rfl: 1 .  loperamide (IMODIUM) 2 MG capsule, Take 1 capsule (2 mg total) by mouth 3 (three) times daily as needed for diarrhea or loose stools. Office visit for further refills, Disp: 90 capsule, Rfl: 2 .  losartan (COZAAR) 100 MG tablet, Take 1 tablet (100 mg total) by mouth daily., Disp: 90 tablet, Rfl: 1 .  omeprazole (PRILOSEC) 40 MG capsule, Take 1 capsule (40 mg total) by mouth 2 (two) times daily., Disp: 180 capsule, Rfl: 1  Review of Systems:  Constitutional: Denies fever, chills, diaphoresis, appetite change and fatigue.  HEENT: Denies photophobia, eye pain, redness, hearing loss, ear pain, congestion, sore throat,  rhinorrhea, sneezing, mouth sores, trouble swallowing, neck pain, neck stiffness and tinnitus.   Respiratory: Denies SOB, DOE, cough, chest tightness,  and wheezing.   Cardiovascular: Denies chest pain, palpitations and leg swelling.  Gastrointestinal: Denies nausea, vomiting, abdominal pain, diarrhea, constipation, blood in stool and abdominal distention.  Genitourinary: Denies dysuria, urgency, frequency, hematuria, flank pain and difficulty urinating.  Endocrine: Denies: hot or cold intolerance, sweats, changes in hair or nails, polyuria, polydipsia. Musculoskeletal: Denies myalgias, back pain, joint swelling, arthralgias and gait problem.  Skin: Denies pallor, rash and wound.  Neurological: Denies dizziness, seizures, syncope, weakness, light-headedness, numbness and headaches.  Hematological: Denies adenopathy. Easy bruising, personal or family bleeding history  Psychiatric/Behavioral: Denies suicidal ideation, mood changes, confusion, nervousness, sleep disturbance and agitation    Physical Exam: Vitals:   03/15/20 1059  BP: 140/80  Pulse: 84  Temp: 98.5 F (36.9 C)  TempSrc: Oral  SpO2: 93%  Weight: 134 lb 9.6 oz (61.1 kg)    Body mass index is 26.29 kg/m.   Constitutional: NAD, calm,  comfortable Eyes: PERRL, lids and conjunctivae normal, wears corrective lenses ENMT: Mucous membranes are moist.  Respiratory: clear to auscultation bilaterally, no wheezing, no crackles. Normal respiratory effort. No accessory muscle use.  Cardiovascular: Regular rate and rhythm, no murmurs / rubs / gallops. No extremity edema.  Neurologic: Grossly intact and nonfocal. Psychiatric: Normal judgment and insight. Alert and oriented x 3. Normal mood.    Impression and Plan:  Hyperlipidemia, unspecified hyperlipidemia type  - Plan: Lipid panel -She is not currently on a statin.  Seasonal allergies  - Plan: cetirizine (ZYRTEC) 10 MG tablet, fluticasone (FLONASE) 50 MCG/ACT nasal spray  Depression, recurrent (HCC)  - Plan: DULoxetine (CYMBALTA) 60 MG capsule -PHQ 9 score is 1 today.  Essential hypertension  - Plan: losartan (COZAAR) 100 MG tablet -Blood pressure is elevated today. -She will do ambulatory blood pressure monitoring and return for follow-up.  Gastroesophageal reflux disease without esophagitis  - Plan: omeprazole (PRILOSEC) 40 MG capsule  Crohn's disease of large intestine without complication ([place])  - Plan: dicyclomine (BENTYL) 20 MG tablet, loperamide (IMODIUM) 2 MG capsule -Has follow-up with GI today.  Tremor of right hand  - Plan: Ambulatory referral to Neurology -Unclear if any association with recent wrist injury but since tremors have been present before, doubtful.   Patient Instructions  -Nice seeing you today!!  -Lab work today; will notify you once results are available.  -Schedule follow up in 6 months for your physical. Please come in fasting that day.     Lelon Frohlich, MD [facility]

## 2020-03-15 NOTE — Telephone Encounter (Signed)
Patient is aware See lab result note

## 2020-03-15 NOTE — Patient Instructions (Signed)
-  Nice seeing you today!!  -Lab work today; will notify you once results are available.  -Schedule follow up in 6 months for your physical. Please come in fasting that day.

## 2020-03-15 NOTE — Progress Notes (Signed)
HISTORY OF PRESENT ILLNESS:  [name] is a 75 y.o. female with a myriad of GI issues who established care here, from [place], October 08, 2018.  A summary of her outside history is as follows:  1. Crohn's colitis diagnosed at the [facility] in 1997. Last colonoscopy with Dr. Tonny Branch 30, 2019. The ileum was normal. Chronic colitis noted in the left colon. Diminutive pseudopolyp in the cecum. Mild internal hemorrhoids. Biopsies of the cecal polyp were a benign lymphoid aggregate. Biopsies in the right colon were normal. Biopsies in the left colon revealed inactive chronic colitis. The patient was told to take Lialda 4.8 g daily for 8 weeks then 2.4 g daily thereafter. She tells me that she has been off Lialda for 6 months. She tells me that she self medicates with amoxicillin periodically for "Crohn's flare". 2. GERD.On omeprazole 40 mg daily. 3. 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 All studies in [place].. Surgery recommended. Patient has declined. Last EGD with Dr. Algie Coffer 26, 2019. Noted to have a large hiatal hernia, mild gastritis, and benign gastric polyp. 4. Chronic diarrhea with intermittent constipation. Patient has had extensive work-up abnormalities include low pancreatic fecal elastase with normal stool fat. She has been given the diagnosis of pancreatic insufficiencyand treated with Creon. Mild elevation of gastrin (364) with negative octreotide scan. Normal 5-hydroxyindoleacetic acid levels. Normal lactose tolerance test. Negative C. difficile. Colon biopsies negative for microscopic colitis. Negative Pentetriotidescan. She takes approximately 2 Imodium per day. 5. Chronic nausea. TakesZofran 6. Intermittent chronic abdominal pain. Takes dicyclomine.  At the time of her October 2020 visit she was advised to resume Lialda  (which we prescribed), omeprazole (prescribed), reflux precautions, Creon per patient request (prescribed), Zofran per patient request (prescribed), dicyclomine per patient request (prescribe).  I declined prescribed amoxicillin for the patient's on-demand use for "Crohn's flare".  She states that her outside physician would prescribe this for her and this helped her diarrhea.  She was felt to have a symptomatic large paraesophageal hernia for which surgery had been recommended.  I recommended a surgical opinion as well.  Otherwise, routine GI follow-up in 1 year.  Patient subsequently contacted the office complaining of diarrhea.  Requested Imodium.  Blood work was obtained and found to be unremarkable.  She was asked to submit stool studies but tells me that by the time she was to go to the laboratory her diarrhea has improved.  She did increase Creon from 2-3 daily.  Since that time she continues with diarrhea alternating with constipation.  Diarrhea predominates.  She continues with swallowing discomfort and postprandial pain in the region of the left shoulder.  She has numerous questions regarding her Crohn's disease, pancreas, reflux, paraesophageal hernia, and chronic abdominal pain.  I last saw the patient January 29, 2019.  See that dictation for details.  The impression and plan were as follows: ASSESSMENT:  1.  Diarrhea predominant irritable bowel syndrome.  Principal diagnosis, in my mind, regarding her bowel habit issues. 2.  Left-sided Crohn's colitis. 3.  Large paraesophageal hernia.  Symptomatic 4.  GERD.  Some breakthrough symptoms currently 5.  Query pancreatic insufficiency 6.  Chronic nausea 7.  Chronic abdominal pain   PLAN:  1.  Discussion on irritable bowel syndrome 2.  Again declined prescribing her antibiotics for on-demand use for her diarrhea 3.  Discussed titrating Creon back to 2 4.  May take antiacid at  night for breakthrough heartburn 5.  Schedule upper GI  series to evaluate paraesophageal hernia 6.  Surgical referral thereafter for an opinion regarding paraesophageal hernia repair.  She is now open to that idea she has had worsening problems with postprandial pain and dysphagia 7.  Ongoing general care with PCP 8.  Routine GI follow-up 1 year.  Sooner if needed   She did have the upper GI series completed.  She was found to have a large hiatal hernia with 4/5 of her stomach in her chest.  She was referred to Dr. Alphonsa Overall for surgical opinion.  Sounds like she has an excellent visit side against surgery.  Overall her GI complaints are the same as they had been previously including postprandial shoulder pain, chronic diarrhea, chronic nausea, and varying degrees of chronic abdominal pain and bloating.  She is here to check in and requests refill of multiple medications including dicyclomine, loperamide, mesalamine, and omeprazole  REVIEW OF SYSTEMS:  All non-GI ROS negative unless stated in the HPI except for arthritis, sinus and allergy, hearing problems, cough  Past Medical History:  Diagnosis Date  . Allergies   . Bruises easily   . Complication of anesthesia   . Crohn's disease ([place])    GI Dr Beulah Gandy in [place], [place]   . Deviated septum   . GERD (gastroesophageal reflux disease)   . Headache    occ   . Hiatal hernia   . HTN (hypertension)    pcp Dr Margaretann Loveless  in [place], [place]   . Neuropathy    fingers,periodically   . Osteoarthritis   . Pancreatic insufficiency   . PONV (postoperative nausea and vomiting)   . Rheumatoid arthritis ([place])   . Scoliosis   . Squamous cell carcinoma of arm    left   . Whooping cough 2015  . Whooping cough with pneumonia    2017    Past Surgical History:  Procedure Laterality Date  . CATARACT EXTRACTION, BILATERAL  2018   with IOC implant   . COLONOSCOPY    . DEBRIDEMENT AND CLOSURE WOUND Right 10/01/2018   Procedure: Excision of right knee wound with placement of  ACell and Pravena;  Surgeon: Wallace Going, DO;  Location: [facility];  Service: Plastics;  Laterality: Right;  30 min  . JOINT REPLACEMENT  2020   right knee  . knee fracture     . NASAL FRACTURE SURGERY     needed b/c she was getting frequent sinus infections   . 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 TOTAL KNEE ARTHROPLASTY Left 03/02/2019   Procedure: TOTAL KNEE ARTHROPLASTY;  Surgeon: Paralee Cancel, MD;  Location: [facility];  Service: Orthopedics;  Laterality: Left;  70 mins  . UPPER GASTROINTESTINAL ENDOSCOPY      Social History [place]  reports that she quit smoking about 32 years ago. Her smoking use included cigarettes. She has a 30.00 pack-year smoking history. She has never used smokeless tobacco. She reports previous alcohol use. She reports that she does not use drugs.  family history includes Diabetes in her father.  Allergies  Allergen Reactions  . Adhesive [Tape]     Redness and skin peeling  . Aspirin     Intestinal Bleeding  . Ciprofloxacin Nausea And Vomiting  . Codeine Nausea And Vomiting  . Demerol [Meperidine Hcl]  Nausea And Vomiting  . Lactose Intolerance (Gi) Other (See Comments)    Pt has Crohn's   . Latex     Redness and skin peeling  . Other     Nuts-itching in throat  Seeds-stomach issues with chrons    . Septra [Sulfamethoxazole-Trimethoprim] Nausea Only       PHYSICAL EXAMINATION: Vital signs: BP 128/82   Pulse 76   Ht 5' (1.524 m)   Wt 134 lb 6.4 oz (61 kg)   BMI 26.25 kg/m   Constitutional: generally well-appearing, no acute distress Psychiatric: alert and oriented x3, cooperative Eyes: extraocular movements intact, anicteric, conjunctiva pink Mouth: oral pharynx moist, no lesions Neck: supple no lymphadenopathy Cardiovascular: heart regular rate and rhythm, no murmur Lungs:  clear to auscultation bilaterally Abdomen: soft, nontender, nondistended, no obvious ascites, no peritoneal signs, normal bowel sounds, no organomegaly Rectal: Omitted Extremities: no clubbing, cyanosis, or lower extremity edema bilaterally.  Right wrist splint Skin: no lesions on visible extremities Neuro: No focal deficits.  Cranial nerves intact   ASSESSMENT:  1.  Diarrhea predominant irritable bowel syndrome.  Principal diagnosis, in my mind, regarding her bowel habit issues. 2.  Left-sided Crohn's colitis. 3.  Large paraesophageal hernia.  Symptomatic but stable.  Has been seen by surgery.  She is not interested in surgery. 4.  GERD.    Ongoing 5.  Query pancreatic insufficiency 6.  Chronic nausea 7.  Chronic abdominal pain   PLAN:  1.  Reflux precautions 2.  Refill omeprazole.  Medication risks reviewed 3.  Refill mesalamine.  Medication risks reviewed 4.  Refill loperamide.  Medication risks reviewed 5.  Refill dicyclomine.  Medication risks reviewed 6.  Routine office follow-up 1 year A total time of 30 minutes was spent preparing to see the patient, obtaining comprehensive history, performing medically appropriate exam, counseling the patient regarding her above listed issues, ordering medications, and documenting clinical information in the health record

## 2020-03-17 NOTE — Progress Notes (Deleted)
Assessment/Plan:    -We will do TSH.  That has never been done, at least within our system. ***  Subjective:   [name] was seen today in the movement disorders clinic for neurologic consultation at the request of Isaac Bliss, [place]*.  The consultation is for the evaluation of right hand tremor.  Medical records available to me have been reviewed.   Specific Symptoms:  Tremor: {yes no:314532} Family hx of similar:  {yes no:314532} Voice: *** Sleep: ***  Vivid Dreams:  {yes no:314532}  Acting out dreams:  {yes no:314532} Wet Pillows: {yes no:314532} Postural symptoms:  {yes no:314532}  Falls?  {yes no:314532} Bradykinesia symptoms: {parkinson brady:18041} Loss of smell:  {yes no:314532} Loss of taste:  {yes no:314532} Urinary Incontinence:  {yes no:314532} Difficulty Swallowing:  {yes no:314532} Handwriting, micrographia: {yes no:314532} Trouble with ADL's:  {yes no:314532}  Trouble buttoning clothing: {yes no:314532} Depression:  {yes no:314532} Memory changes:  {yes no:314532} Hallucinations:  {yes no:314532}  visual distortions: {yes no:314532} N/V:  {yes no:314532} Lightheaded:  {yes no:314532}  Syncope: {yes no:314532} Diplopia:  {yes no:314532} Dyskinesia:  {yes no:314532} Prior exposure to reglan/antipsychotics: No.  Neuroimaging of the brain has not previously been performed.  It *** available for my review today.  PREVIOUS MEDICATIONS: {Parkinson's RX:18200}  ALLERGIES:   Allergies  Allergen Reactions  . Adhesive [Tape]     Redness and skin peeling  . Aspirin     Intestinal Bleeding  . Ciprofloxacin Nausea And Vomiting  . Codeine Nausea And Vomiting  . Demerol [Meperidine Hcl] Nausea And Vomiting  . Lactose Intolerance (Gi) Other (See Comments)    Pt has Crohn's   . Latex     Redness and skin peeling  . Other     Nuts-itching in throat  Seeds-stomach issues with chrons    . Septra [Sulfamethoxazole-Trimethoprim] Nausea Only     CURRENT MEDICATIONS:  Current Outpatient Medications  Medication Instructions  . Calcium Citrate-Vitamin D (CALCIUM + D PO) 1 tablet, Oral, 2 times daily  . cetirizine (ZYRTEC) 10 mg, Oral, Daily  . dicyclomine (BENTYL) 20 mg, Oral, 4 times daily PRN  . DULoxetine (CYMBALTA) 60 mg, Oral, 2 times daily  . fluticasone (FLONASE) 50 MCG/ACT nasal spray SPRAY 2 SPRAYS INTO EACH NOSTRIL EVERY DAY  . HYDROcodone-acetaminophen (NORCO) 5-325 MG tablet 1 tablet, Oral, 2 times daily PRN  . loperamide (IMODIUM) 2 mg, Oral, 3 times daily PRN  . losartan (COZAAR) 100 mg, Oral, Daily  . mesalamine (LIALDA) 4.8 g, Oral, Daily with breakfast  . Misc Natural Product Nasal (NASAL CLEANSE RINSE MIX NA) 1 Dose, Nasal, Daily PRN  . Multiple Vitamin (MULTIVITAMIN WITH MINERALS) TABS tablet 1 tablet, Oral, Daily  . omeprazole (PRILOSEC) 40 mg, Oral, 2 times daily    Objective:   VITALS:  There were no vitals filed for this visit.  GEN:  The patient appears stated age and is in NAD. HEENT:  Normocephalic, atraumatic.  The mucous membranes are moist. The superficial temporal arteries are without ropiness or tenderness. CV:  RRR Lungs:  CTAB Neck/HEME:  There are no carotid bruits bilaterally.  Neurological examination:  Orientation: The patient is alert and oriented x3.  Cranial nerves: There is good facial symmetry. Extraocular muscles are intact. The visual fields are full to confrontational testing. The speech is fluent and clear. Soft palate rises symmetrically and there is no tongue deviation. Hearing is intact to conversational tone. Sensation: Sensation is intact to light and pinprick throughout (facial, trunk,  extremities). Vibration is intact at the bilateral big toe. There is no extinction with double simultaneous stimulation. There is no sensory dermatomal level identified. Motor: Strength is 5/5 in the bilateral upper and lower extremities.   Shoulder shrug is equal and symmetric.  There is  no pronator drift. Deep tendon reflexes: Deep tendon reflexes are 2/4 at the bilateral biceps, triceps, brachioradialis, patella and achilles. Plantar responses are downgoing bilaterally.  Movement examination: Tone: There is ***tone in the bilateral upper extremities.  The tone in the lower extremities is ***.  Abnormal movements: *** Coordination:  There is *** decremation with RAM's, *** Gait and Station: The patient has *** difficulty arising out of a deep-seated chair without the use of the hands. The patient's stride length is ***.  The patient has a *** pull test.     I have reviewed and interpreted the following labs independently   Chemistry      Component Value Date/Time   NA 138 11/08/2019 1048   K 4.3 11/08/2019 1048   CL 102 11/08/2019 1048   CO2 27 11/08/2019 1048   BUN 25 11/08/2019 1048   CREATININE 1.10 (H) 11/08/2019 1048      Component Value Date/Time   CALCIUM 9.6 11/08/2019 1048   ALKPHOS 97 02/16/2019 1534   AST 17 09/16/2019 1137   ALT 10 09/16/2019 1137   BILITOT 0.5 09/16/2019 1137      No results found for: TSH Lab Results  Component Value Date   WBC 8.0 09/16/2019   HGB 13.5 09/16/2019   HCT 40.9 09/16/2019   MCV 90.3 09/16/2019   PLT 363 09/16/2019     Total time spent on today's visit was ***60 minutes, including both face-to-face time and nonface-to-face time.  Time included that spent on review of records (prior notes available to me/labs/imaging if pertinent), discussing treatment and goals, answering patient's questions and coordinating care.  Cc:  Isaac Bliss, Rayford Halsted, MD

## 2020-03-20 NOTE — Progress Notes (Signed)
Assessment/Plan:   1.  Tremor  -Really did not see much tremor today.  She had very little rest tremor in the right thumb, but she actually was complaining about an action tremor.  I did not see any today.  She reported that it was a good day.  We will do TSH.  That has never been done, at least within our system.  -Told patient and her husband that I would like to keep an eye on her and at least see her on a yearly basis.  Her husband reported some shuffling (not seen today) and she does have a family history of Parkinson's disease.  While most Parkinson's disease is not inherited, given the symptoms and the signs we saw today, I think it is important that we at least follow her.  Both the patient and husband agreed.  -I did not recommend any medication for tremor today, mostly because I did not see it, but also because I think that the risks outweigh the benefits.  Both patient and husband agreed.  -I did encourage the patient and husband to call me should any new neurologic symptoms arise, or should worsening of symptoms arise before next visit.  Otherwise, we will plan on seeing her back in about 1 year.   Subjective:   [name] was seen today in the movement disorders clinic for neurologic consultation at the request of Isaac Bliss, [place]*.  The consultation is for the evaluation of right hand tremor.  Medical records available to me have been reviewed.  This patient is accompanied in the office by her spouse who supplements the history.   Specific Symptoms:  Tremor: Yes.   , R hand x 10 years.  Noted with activation, occasionally at rest.  Noting with eating (esp cereal).  Notes it with water in glass but doesn't spill it.  Drinks some caffeine in form of hot chocolate only.  Drinks no alcohol.  Husband states tremor a little worse over the last 2 years.   Family hx of similar:  Yes.  , father with PD Voice: no changes Sleep: usually sleeps well  Vivid Dreams:  No.  Acting  out dreams:  Some sleep talk Wet Pillows: No. Postural symptoms: not as good as it used to be, but has had both knees replaced (balance worse before knees done)  Falls?  Some before knees done Bradykinesia symptoms: was having trouble getting OOC but no trouble now after PT showed her; some trouble tripping on objects as doesn't pick up feet (long standing) Loss of smell:  No. Loss of taste:  No. Urinary Incontinence:  No. (some stress incontinence) Difficulty Swallowing:  No. (except has hiatal hernia issue) Handwriting, micrographia: No. Trouble with ADL's:  No.  Trouble buttoning clothing: No. Depression:  No. Memory changes:  No. Hallucinations:  No.  visual distortions: No. N/V:  No. Lightheaded:  No.  Syncope: No. Diplopia:  No. Dyskinesia:  No. Prior exposure to reglan/antipsychotics: No.  Neuroimaging of the brain has not previously been performed.     ALLERGIES:   Allergies  Allergen Reactions  . Adhesive [Tape]     Redness and skin peeling  . Aspirin     Intestinal Bleeding  . Ciprofloxacin Nausea And Vomiting  . Codeine Nausea And Vomiting  . Demerol [Meperidine Hcl] Nausea And Vomiting  . Lactose Intolerance (Gi) Other (See Comments)    Pt has Crohn's   . Latex     Redness and skin peeling  .  Other     Nuts-itching in throat  Seeds-stomach issues with chrons    . Septra [Sulfamethoxazole-Trimethoprim] Nausea Only    CURRENT MEDICATIONS:  Current Outpatient Medications  Medication Instructions  . Calcium Citrate-Vitamin D (CALCIUM + D PO) 1 tablet, Oral, 2 times daily  . cetirizine (ZYRTEC) 10 mg, Oral, Daily  . dicyclomine (BENTYL) 20 mg, Oral, 4 times daily PRN  . DULoxetine (CYMBALTA) 60 mg, Oral, 2 times daily  . fluticasone (FLONASE) 50 MCG/ACT nasal spray SPRAY 2 SPRAYS INTO EACH NOSTRIL EVERY DAY  . HYDROcodone-acetaminophen (NORCO) 5-325 MG tablet 1 tablet, Oral, 2 times daily PRN  . loperamide (IMODIUM) 2 mg, Oral, 3 times daily PRN  .  losartan (COZAAR) 100 mg, Oral, Daily  . mesalamine (LIALDA) 4.8 g, Oral, Daily with breakfast  . Misc Natural Product Nasal (NASAL CLEANSE RINSE MIX NA) 1 Dose, Nasal, Daily PRN  . Multiple Vitamin (MULTIVITAMIN WITH MINERALS) TABS tablet 1 tablet, Oral, Daily  . omeprazole (PRILOSEC) 40 mg, Oral, 2 times daily    Objective:   VITALS:   Vitals:   03/22/20 1007  BP: 130/72  Weight: 137 lb (62.1 kg)  Height: 5' 1"  (1.549 m)    GEN:  The patient appears stated age and is in NAD. HEENT:  Normocephalic, atraumatic.  The mucous membranes are moist. The superficial temporal arteries are without ropiness or tenderness. CV:  RRR with 3/6 SEM Lungs:  CTAB Neck/HEME:  There are no carotid bruits bilaterally.  Neurological examination:  Orientation: The patient is alert and oriented x3.  Cranial nerves: There is good facial symmetry. Extraocular muscles are intact. The visual fields are full to confrontational testing. The speech is fluent and clear. Soft palate rises symmetrically and there is no tongue deviation. Hearing is slightly decreased to conversational tone. Sensation: Sensation is intact to light and pinprick throughout (facial, trunk, extremities). Vibration is intact at the bilateral big toe. There is no extinction with double simultaneous stimulation. There is no sensory dermatomal level identified. Motor: Strength is 5/5 in the bilateral upper and lower extremities.   Shoulder shrug is equal and symmetric.  There is no pronator drift. Deep tendon reflexes: Deep tendon reflexes are 2/4 at the bilateral biceps, triceps, brachioradialis, 2- at the right patella and 2/4 at the left  and 1/4 at the bilateral achilles. Plantar responses are downgoing bilaterally.  Movement examination: Tone: There is normal tone in the bilateral upper extremities.  The tone in the lower extremities is normal.  Abnormal movements: Rare tremor in the right thumb, only with distraction procedures.  She  has no postural tremor.  No intention tremor.  No trouble with Archimedes spirals on right or left. Coordination:  There is no decremation with RAM's, with any form of RAMS, including alternating supination and pronation of the forearm, hand opening and closing, finger taps, heel taps and toe taps. Gait and Station: The patient has minimal difficulty arising out of a deep-seated chair without the use of the hands. The patient's stride length is good but she is turned to the right (scoliosis).   I have reviewed and interpreted the following labs independently   Chemistry      Component Value Date/Time   NA 138 11/08/2019 1048   K 4.3 11/08/2019 1048   CL 102 11/08/2019 1048   CO2 27 11/08/2019 1048   BUN 25 11/08/2019 1048   CREATININE 1.10 (H) 11/08/2019 1048      Component Value Date/Time   CALCIUM 9.6 11/08/2019  1048   ALKPHOS 97 02/16/2019 1534   AST 17 09/16/2019 1137   ALT 10 09/16/2019 1137   BILITOT 0.5 09/16/2019 1137      No results found for: TSH Lab Results  Component Value Date   WBC 8.0 09/16/2019   HGB 13.5 09/16/2019   HCT 40.9 09/16/2019   MCV 90.3 09/16/2019   PLT 363 09/16/2019     Total time spent on today's visit was 60 minutes, including both face-to-face time and nonface-to-face time.  Time included that spent on review of records (prior notes available to me/labs/imaging if pertinent), discussing treatment and goals, answering patient's questions and coordinating care.  Cc:  Isaac Bliss, Rayford Halsted, MD

## 2020-03-21 ENCOUNTER — Ambulatory Visit: Payer: Medicare Other | Admitting: Neurology

## 2020-03-22 ENCOUNTER — Other Ambulatory Visit: Payer: Self-pay

## 2020-03-22 ENCOUNTER — Other Ambulatory Visit (INDEPENDENT_AMBULATORY_CARE_PROVIDER_SITE_OTHER): Payer: Medicare Other

## 2020-03-22 ENCOUNTER — Ambulatory Visit (INDEPENDENT_AMBULATORY_CARE_PROVIDER_SITE_OTHER): Payer: Medicare Other | Admitting: Neurology

## 2020-03-22 ENCOUNTER — Encounter: Payer: Self-pay | Admitting: Neurology

## 2020-03-22 VITALS — BP 130/72 | Ht 61.0 in | Wt 137.0 lb

## 2020-03-22 DIAGNOSIS — R251 Tremor, unspecified: Secondary | ICD-10-CM

## 2020-03-22 LAB — TSH: TSH: 1.8 u[IU]/mL (ref 0.35–4.50)

## 2020-03-22 NOTE — Patient Instructions (Addendum)
Your provider has requested that you have labwork completed today. Please go to [facility] ([id number]) on the second floor of this building before leaving the office today. You do not need to check in. If you are not called within 15 minutes please check with the front desk.   Your physician recommends that you schedule a follow-up appointment in: 1 year

## 2020-03-23 ENCOUNTER — Telehealth: Payer: Self-pay

## 2020-03-23 NOTE — Telephone Encounter (Signed)
-----   Message from [place], DO sent at 03/23/2020  7:10 AM EDT ----- I have reviewed all lab results which are normal or stable. Please inform the patient.

## 2020-03-23 NOTE — Telephone Encounter (Signed)
Pt called no answer LVM to call the office back

## 2020-05-02 ENCOUNTER — Telehealth: Payer: Self-pay | Admitting: Internal Medicine

## 2020-05-02 DIAGNOSIS — Z1283 Encounter for screening for malignant neoplasm of skin: Secondary | ICD-10-CM

## 2020-05-02 NOTE — Telephone Encounter (Signed)
Patient is calling and wanted to see if provider can refer her to a dermatologist for the two places she has on her skin, please advise. CB is [phone]

## 2020-05-03 NOTE — Addendum Note (Signed)
Addended by: Westley Hummer B on: 05/03/2020 11:46 AM   Modules accepted: Orders

## 2020-05-03 NOTE — Telephone Encounter (Signed)
Referral placed.

## 2020-05-12 ENCOUNTER — Telehealth: Payer: Self-pay | Admitting: Internal Medicine

## 2020-05-12 MED ORDER — RIFAXIMIN 550 MG PO TABS: 550.0000 mg | ORAL_TABLET | Freq: Three times a day (TID) | ORAL | 1 refills | Status: DC

## 2020-05-12 NOTE — Telephone Encounter (Signed)
Inbound call from patient. Need prior authorization for prescription

## 2020-05-12 NOTE — Telephone Encounter (Signed)
Left message for pt and script sent to pharmacy.

## 2020-05-12 NOTE — Telephone Encounter (Signed)
He has irritable bowel syndrome.  Prescribe Xifaxan 550 mg 3 times daily for 2 weeks

## 2020-05-12 NOTE — Telephone Encounter (Signed)
Inbound call from patient requesting call from a nurse please.  States she has been experiencing diarrhea none stop.  Please advise.

## 2020-05-12 NOTE — Telephone Encounter (Signed)
Pt reports she has had chronic diarrhea for a couple of years now. She reports that a few days ago it got much worse and it just flows out of her, she has no control. States yesterday she was having to run to the bathroom about every 28mn. She has taken imodium but reports it has not helped at all. Please advise.

## 2020-05-15 ENCOUNTER — Other Ambulatory Visit: Payer: Self-pay | Admitting: Internal Medicine

## 2020-05-15 ENCOUNTER — Encounter: Payer: Self-pay | Admitting: Internal Medicine

## 2020-05-15 DIAGNOSIS — L309 Dermatitis, unspecified: Secondary | ICD-10-CM

## 2020-05-15 NOTE — Telephone Encounter (Signed)
Patient called state the medication is too expensive and they need PA. She said she is still having uncontrollable diarrhea and is seeking help

## 2020-05-15 NOTE — Telephone Encounter (Signed)
Pt needs PA for xifaxan

## 2020-05-16 NOTE — Telephone Encounter (Signed)
Lm on vm that I would leave some samples of Xifaxan up front for patient to pick up,.

## 2020-06-23 ENCOUNTER — Telehealth: Payer: Self-pay | Admitting: Internal Medicine

## 2020-06-23 DIAGNOSIS — K5010 Crohn's disease of large intestine without complications: Secondary | ICD-10-CM

## 2020-06-23 MED ORDER — LOPERAMIDE HCL 2 MG PO CAPS: 2.0000 mg | ORAL_CAPSULE | Freq: Three times a day (TID) | ORAL | 1 refills | Status: DC | PRN

## 2020-06-23 NOTE — Telephone Encounter (Signed)
Refilled Imdodium

## 2020-06-23 NOTE — Telephone Encounter (Signed)
Inbound call from patient. Need medication refill for Loperamide 59m sent to [facility] on [address]

## 2020-07-23 ENCOUNTER — Other Ambulatory Visit: Payer: Self-pay | Admitting: Internal Medicine

## 2020-07-23 DIAGNOSIS — J302 Other seasonal allergic rhinitis: Secondary | ICD-10-CM

## 2020-07-23 DIAGNOSIS — F339 Major depressive disorder, recurrent, unspecified: Secondary | ICD-10-CM

## 2020-08-02 ENCOUNTER — Telehealth: Payer: Self-pay | Admitting: Internal Medicine

## 2020-08-02 NOTE — Telephone Encounter (Signed)
Pt states she has IBS and is having diarrhea. States she is taking bentyl and Imodium and they are not helping. Pt requesting to be seen sooner. Pts appt moved to 08/21/20@3 :40pm. Pt aware of appt.

## 2020-08-02 NOTE — Telephone Encounter (Signed)
Patient called states she is having issues with diarrhea and is seeking advise.

## 2020-08-21 ENCOUNTER — Ambulatory Visit (INDEPENDENT_AMBULATORY_CARE_PROVIDER_SITE_OTHER): Payer: Medicare Other | Admitting: Internal Medicine

## 2020-08-21 ENCOUNTER — Encounter: Payer: Self-pay | Admitting: Internal Medicine

## 2020-08-21 VITALS — BP 132/70 | HR 74 | Ht 60.0 in | Wt 127.1 lb

## 2020-08-21 DIAGNOSIS — K219 Gastro-esophageal reflux disease without esophagitis: Secondary | ICD-10-CM | POA: Diagnosis not present

## 2020-08-21 DIAGNOSIS — I10 Essential (primary) hypertension: Secondary | ICD-10-CM

## 2020-08-21 DIAGNOSIS — R197 Diarrhea, unspecified: Secondary | ICD-10-CM

## 2020-08-21 DIAGNOSIS — R109 Unspecified abdominal pain: Secondary | ICD-10-CM | POA: Diagnosis not present

## 2020-08-21 DIAGNOSIS — K5010 Crohn's disease of large intestine without complications: Secondary | ICD-10-CM

## 2020-08-21 MED ORDER — OMEPRAZOLE 40 MG PO CPDR: 40.0000 mg | DELAYED_RELEASE_CAPSULE | Freq: Two times a day (BID) | ORAL | 1 refills | Status: DC

## 2020-08-21 MED ORDER — LOPERAMIDE HCL 2 MG PO CAPS: 2.0000 mg | ORAL_CAPSULE | Freq: Three times a day (TID) | ORAL | 6 refills | Status: DC | PRN

## 2020-08-21 MED ORDER — MESALAMINE 1.2 G PO TBEC: 4.8000 g | DELAYED_RELEASE_TABLET | Freq: Every day | ORAL | 3 refills | Status: DC

## 2020-08-21 MED ORDER — COLESTIPOL HCL 1 G PO TABS: 2.0000 g | ORAL_TABLET | Freq: Two times a day (BID) | ORAL | 3 refills | Status: DC

## 2020-08-21 MED ORDER — DICYCLOMINE HCL 20 MG PO TABS: 20.0000 mg | ORAL_TABLET | Freq: Four times a day (QID) | ORAL | 3 refills | Status: DC | PRN

## 2020-08-21 NOTE — Patient Instructions (Signed)
If you are age 75 or older, your body mass index should be between 23-30. Your Body mass index is 24.83 kg/m. If this is out of the aforementioned range listed, please consider follow up with your Primary Care Provider.  If you are age 75 or younger, your body mass index should be between 19-25. Your Body mass index is 24.83 kg/m. If this is out of the aformentioned range listed, please consider follow up with your Primary Care Provider.   __________________________________________________________  The [facility] providers would like to encourage you to use [facility] to communicate with providers for non-urgent requests or questions.  Due to long hold times on the telephone, sending your provider a message by [facility] may be a faster and more efficient way to get a response.  Please allow 48 business hours for a response.  Please remember that this is for non-urgent requests.   We have sent the following medications to your pharmacy for you to pick up at your convenience: Imodium, Lialda, Dicyclomine, Omeprazole, Colestid  You have been scheduled for an abdominal ultrasound at [facility] (1st floor of hospital) on 08/24/2020 at 8:30am. Please arrive 15 minutes prior to your appointment for registration. Make certain not to have anything to eat or drink after midnight prior to your appointment. Should you need to reschedule your appointment, please contact radiology at [phone]. This test typically takes about 30 minutes to perform.  Please follow up on ____________________________

## 2020-08-21 NOTE — Progress Notes (Signed)
HISTORY OF PRESENT ILLNESS:  [name] is a 75 y.o. female with a myriad of GI issues who established care here, from [place], October 08, 2018.  A summary of her outside history is as follows:   1.  Crohn's colitis diagnosed at the [facility] in 1997.  Last colonoscopy with Dr. Beulah Gandy Jun 05, 2017.  The ileum was normal.  Chronic colitis noted in the left colon.  Diminutive pseudopolyp in the cecum.  Mild internal hemorrhoids.  Biopsies of the cecal polyp were a benign lymphoid aggregate.  Biopsies in the right colon were normal.  Biopsies in the left colon revealed inactive chronic colitis.  The patient was told to take Lialda 4.8 g daily for 8 weeks then 2.4 g daily thereafter.  She tells me that she has been off Lialda for 6 months.  She tells me that she self medicates with amoxicillin periodically for "Crohn's flare". 2.  GERD.  On omeprazole 40 mg daily. 3.  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  All studies in [place]..  Surgery recommended.  Patient has declined.  Last EGD with Dr. Beulah Gandy March 04, 2017.  Noted to have a large hiatal hernia, mild gastritis, and benign gastric polyp. 4.  Chronic diarrhea with intermittent constipation.  Patient has had extensive work-up abnormalities include low pancreatic fecal elastase with normal stool fat.  She has been given the diagnosis of pancreatic insufficiency and treated with Creon.  Mild elevation of gastrin (364) with negative octreotide scan.  Normal 5-hydroxyindoleacetic acid levels.  Normal lactose tolerance test.  Negative C. difficile.  Colon biopsies negative for microscopic colitis.  Negative Pentetriotide scan.  She takes approximately 2 Imodium per day. 5.  Chronic nausea.  Takes Zofran 6.  Intermittent chronic abdominal pain.  Takes dicyclomine.   At the time of her October 2020 visit she was advised to resume Lialda  (which we prescribed), omeprazole (prescribed), reflux precautions, Creon per patient request (prescribed), Zofran per patient request (prescribed), dicyclomine per patient request (prescribe).  I declined prescribed amoxicillin for the patient's on-demand use for "Crohn's flare".  She states that her outside physician would prescribe this for her and this helped her diarrhea.  She was felt to have a symptomatic large paraesophageal hernia for which surgery had been recommended.  I recommended a surgical opinion as well.  Otherwise, routine GI follow-up in 1 year.  Patient subsequently contacted the office complaining of diarrhea.  Requested Imodium.  Blood work was obtained and found to be unremarkable.  She was asked to submit stool studies but tells me that by the time she was to go to the laboratory her diarrhea has improved.  She did increase Creon from 2-3 daily.  Since that time she continues with diarrhea alternating with constipation.  Diarrhea predominates.  She continues with swallowing discomfort and postprandial pain in the region of the left shoulder.  She has numerous questions regarding her Crohn's disease, pancreas, reflux, paraesophageal hernia, and chronic abdominal pain.   I saw the patient January 29, 2019.  See that dictation for details.  The impression and plan were as follows:  ASSESSMENT:   1.  Diarrhea predominant irritable bowel syndrome.  Principal diagnosis, in my mind, regarding her bowel habit issues. 2.  Left-sided Crohn's colitis. 3.  Large paraesophageal hernia.  Symptomatic 4.  GERD.  Some breakthrough symptoms currently 5.  Query pancreatic insufficiency 6.  Chronic  nausea 7.  Chronic abdominal pain     PLAN:   1.  Discussion on irritable bowel syndrome 2.  Again declined prescribing her antibiotics for on-demand use for her diarrhea 3.  Discussed titrating Creon back to 2 4.  May take antiacid at night for breakthrough heartburn 5.  Schedule upper GI series  to evaluate paraesophageal hernia 6.  Surgical referral thereafter for an opinion regarding paraesophageal hernia repair.  She is now open to that idea she has had worsening problems with postprandial pain and dysphagia 7.  Ongoing general care with PCP 8.  Routine GI follow-up 1 year.  Sooner if needed     She did have the upper GI series completed.  She was found to have a large hiatal hernia with 4/5 of her stomach in her chest.  She was referred to Dr. Alphonsa Overall for surgical opinion.  Sounds like she has an excellent visit and she decided against surgery.  Overall her GI complaints are the same as they had been previously including postprandial shoulder pain, chronic diarrhea, chronic nausea, and varying degrees of chronic abdominal pain and bloating.  She is here to check in and requests refill of multiple medications including dicyclomine, loperamide, mesalamine, and omeprazole  She was last seen in the office March 15, 2020.  See that patient.  Her medications were refilled.  Follow-up in 1 year recommended.  She presents today complaining of ongoing problems with diarrhea.  She will have anywhere from 3-7 bouts of diarrhea per day.  Can be anytime.  Often worse after her evening meal.  No significant nocturnal component.  Occasional incontinence.  Describes occasional oily stools.  Tells me the Creon was never helpful.  Currently taking Imodium 1 twice daily.  She did complete a course of Xifaxan (samples given) which did not help.  She requests a refill of her dicyclomine, loperamide, mesalamine, and omeprazole.  In addition to issues with diarrhea, she continues with her same left upper quadrant left shoulder pain that has been felt to be due to her large hiatal hernia (80% her stomach is in her chest).  She does not believe this is the cause.  She is concerned about cancer.  She is requesting imaging or other studies to be done to further evaluate her pain.  REVIEW OF SYSTEMS:  All non-GI  ROS negative except for arthritis, itching, hearing problems, sleeping problems, shortness of breath  Past Medical History:  Diagnosis Date   Allergies    Bruises easily    Complication of anesthesia    Crohn's disease ([place])    GI Dr Beulah Gandy in [place], [place]    Deviated septum    GERD (gastroesophageal reflux disease)    Headache    occ    Hiatal hernia    HTN (hypertension)    pcp Dr Margaretann Loveless  in [place], [place]    Neuropathy    fingers,periodically    Osteoarthritis    Pancreatic insufficiency    PONV (postoperative nausea and vomiting)    Rheumatoid arthritis ([place])    Scoliosis    Squamous cell carcinoma of arm    left    Whooping cough 2015   Whooping cough with pneumonia    2017    Past Surgical History:  Procedure Laterality Date   CATARACT EXTRACTION, BILATERAL  2018   with IOC implant    COLONOSCOPY     DEBRIDEMENT AND CLOSURE WOUND Right 10/01/2018   Procedure: Excision of right knee  wound with placement of ACell and Pravena;  Surgeon: Wallace Going, DO;  Location: [facility];  Service: Plastics;  Laterality: Right;  30 min   JOINT REPLACEMENT  2020   right knee   knee fracture      NASAL FRACTURE SURGERY     needed b/c she was getting frequent sinus infections    TOTAL KNEE ARTHROPLASTY Right 07/28/2018   Procedure: TOTAL KNEE ARTHROPLASTY;  Surgeon: Paralee Cancel, MD;  Location: [facility];  Service: Orthopedics;  Laterality: Right;   TOTAL KNEE ARTHROPLASTY Left 03/02/2019   Procedure: TOTAL KNEE ARTHROPLASTY;  Surgeon: Paralee Cancel, MD;  Location: [facility];  Service: Orthopedics;  Laterality: Left;  70 mins   UPPER GASTROINTESTINAL ENDOSCOPY      Social History [name]  reports that she quit smoking about 32 years ago. Her smoking use included cigarettes. She has a 30.00 pack-year smoking history. She has never used smokeless tobacco. She reports that she does not currently use alcohol. She reports that she does  not use drugs.  family history includes Diabetes in her father; Parkinson's disease in her father.  Allergies  Allergen Reactions   Adhesive [Tape]     Redness and skin peeling   Aspirin     Intestinal Bleeding   Ciprofloxacin Nausea And Vomiting   Codeine Nausea And Vomiting   Demerol [Meperidine Hcl] Nausea And Vomiting   Lactose Intolerance (Gi) Other (See Comments)    Pt has Crohn's    Latex     Redness and skin peeling   Other     Nuts-itching in throat  Seeds-stomach issues with chrons     Septra [Sulfamethoxazole-Trimethoprim] Nausea Only       PHYSICAL EXAMINATION: Vital signs: BP 132/70   Pulse 74   Ht 5' (1.524 m)   Wt 127 lb 2 oz (57.7 kg)   BMI 24.83 kg/m   Constitutional: generally well-appearing, no acute distress Psychiatric: alert and oriented x3, cooperative Eyes: extraocular movements intact, anicteric, conjunctiva pink Mouth: oral pharynx moist, no lesions Neck: supple no lymphadenopathy Cardiovascular: heart regular rate and rhythm, no murmur Lungs: clear to auscultation bilaterally Abdomen: soft, nontender, nondistended, no obvious ascites, no peritoneal signs, normal bowel sounds, no organomegaly Rectal: Omitted Extremities: no clubbing, cyanosis, or lower extremity edema bilaterally Skin: no lesions on visible extremities Neuro: No focal deficits.  Cranial nerves intact  ASSESSMENT:  1.  Diarrhea predominant irritable bowel syndrome.  Principal diagnosis, in my mind, regarding her bowel habit issues.  Continues to complain of diarrhea. 2.  Left-sided Crohn's colitis. 3.  Large paraesophageal hernia.  Symptomatic but stable.  Has been seen by surgery.  She is not interested in surgery. 4.  GERD.    Ongoing 5.  Query pancreatic insufficiency 6.  Chronic nausea 7.  Chronic abdominal pain 8.  Colonoscopy with negative biopsies elsewhere May 2019  PLAN:  1.  Refill Imodium.  I told her that she may increase to 1 3 times daily or even to 3  times daily.  Hold for constipation.  Prescription refilled 2.  Refill mesalamine 3.  Refill dicyclomine 4.  Refill omeprazole 5.  Prescribed Colestid 2 g twice daily.  If she has a bile salt component, this may help.  Medication risks reviewed 6.  Abdominal ultrasound to assess for other causes of abdominal pain.  We will contact patient after the results are available. 7.  Upper endoscopy to evaluate abdominal pain.The nature of the procedure, as well  as the risks, benefits, and alternatives were carefully and thoroughly reviewed with the patient. Ample time for discussion and questions allowed. The patient understood, was satisfied, and agreed to proceed.  8.  Office follow-up in 6 to 8 weeks after the above A total time of 45 minutes was spent preparing to see the patient, reviewing test, obtaining comprehensive history, performing medically appropriate physical examination, counseling the patient regarding her multiple above listed issues, ordering advanced radiology, medications, and endoscopic procedures.  Finally, documenting clinical information in the health record

## 2020-08-22 ENCOUNTER — Telehealth: Payer: Self-pay | Admitting: Internal Medicine

## 2020-08-22 DIAGNOSIS — K219 Gastro-esophageal reflux disease without esophagitis: Secondary | ICD-10-CM

## 2020-08-22 MED ORDER — OMEPRAZOLE 40 MG PO CPDR: 40.0000 mg | DELAYED_RELEASE_CAPSULE | Freq: Two times a day (BID) | ORAL | 1 refills | Status: DC

## 2020-08-22 NOTE — Telephone Encounter (Signed)
Epic shows that the pharmacy did receive the Omeprazole refill yesterday afternoon but I resent it.

## 2020-08-22 NOTE — Telephone Encounter (Signed)
Inbound call from patient stating pharmacy did not receive omeprazole prescription.  Please advise.

## 2020-08-23 ENCOUNTER — Telehealth: Payer: Self-pay | Admitting: Internal Medicine

## 2020-08-24 ENCOUNTER — Other Ambulatory Visit: Payer: Self-pay

## 2020-08-24 ENCOUNTER — Ambulatory Visit (HOSPITAL_COMMUNITY)
Admission: RE | Admit: 2020-08-24 | Discharge: 2020-08-24 | Disposition: A | Discharge status: Home / Self Care | Payer: Medicare Other | Source: Ambulatory Visit | Attending: Internal Medicine | Admitting: Internal Medicine

## 2020-08-24 DIAGNOSIS — R197 Diarrhea, unspecified: Secondary | ICD-10-CM | POA: Insufficient documentation

## 2020-08-24 DIAGNOSIS — I10 Essential (primary) hypertension: Secondary | ICD-10-CM | POA: Insufficient documentation

## 2020-08-24 DIAGNOSIS — K5010 Crohn's disease of large intestine without complications: Secondary | ICD-10-CM | POA: Insufficient documentation

## 2020-08-24 DIAGNOSIS — K219 Gastro-esophageal reflux disease without esophagitis: Secondary | ICD-10-CM | POA: Insufficient documentation

## 2020-08-24 DIAGNOSIS — R109 Unspecified abdominal pain: Secondary | ICD-10-CM | POA: Insufficient documentation

## 2020-08-24 NOTE — Telephone Encounter (Signed)
Patient was confused about which medicine Dr. Henrene Pastor had increased.  I clarified that he had increased the Lomotil to 2 three times a day.  Patient acknowledged and understood.

## 2020-09-04 ENCOUNTER — Ambulatory Visit (AMBULATORY_SURGERY_CENTER): Payer: Medicare Other | Admitting: Internal Medicine

## 2020-09-04 ENCOUNTER — Encounter: Payer: Self-pay | Admitting: Internal Medicine

## 2020-09-04 ENCOUNTER — Other Ambulatory Visit: Payer: Self-pay

## 2020-09-04 VITALS — BP 147/70 | HR 71 | Temp 96.6°F | Resp 19 | Ht 60.0 in | Wt 127.0 lb

## 2020-09-04 DIAGNOSIS — K449 Diaphragmatic hernia without obstruction or gangrene: Secondary | ICD-10-CM

## 2020-09-04 DIAGNOSIS — R109 Unspecified abdominal pain: Secondary | ICD-10-CM

## 2020-09-04 DIAGNOSIS — K219 Gastro-esophageal reflux disease without esophagitis: Secondary | ICD-10-CM

## 2020-09-04 MED ORDER — SODIUM CHLORIDE 0.9 % IV SOLN: 500.0000 mL | Freq: Once | INTRAVENOUS | Status: DC

## 2020-09-04 NOTE — Progress Notes (Signed)
GI PREPROCEDURE H&P  Patient presents today for upper endoscopy to evaluate abdominal pain.  She was fully evaluated in the office August 21, 2020.  Please see that complete note.  No interval change in her history or physical exam.

## 2020-09-04 NOTE — Op Note (Signed)
[facility] Patient Name: [name] Procedure Date: 09/04/2020 10:50 AM MRN: [id number] Endoscopist: Docia Chuck. Henrene Pastor , MD Age: 75 Referring MD:  Date of Birth: [birth year] Gender: Female Account #: [id number] Procedure:                Upper GI endoscopy Indications:              Abdominal pain Medicines:                Monitored Anesthesia Care Procedure:                Pre-Anesthesia Assessment:                           - Prior to the procedure, a History and Physical                            was performed, and patient medications and                            allergies were reviewed. The patient's tolerance of                            previous anesthesia was also reviewed. The risks                            and benefits of the procedure and the sedation                            options and risks were discussed with the patient.                            All questions were answered, and informed consent                            was obtained. Prior Anticoagulants: The patient has                            taken no previous anticoagulant or antiplatelet                            agents. ASA Grade Assessment: II - A patient with                            mild systemic disease. After reviewing the risks                            and benefits, the patient was deemed in                            satisfactory condition to undergo the procedure.                           After obtaining informed consent, the endoscope was  passed under direct vision. Throughout the                            procedure, the patient's blood pressure, pulse, and                            oxygen saturations were monitored continuously. The                            Endoscope was introduced through the mouth, and                            advanced to the second part of duodenum. The upper                            GI endoscopy was accomplished without  difficulty.                            The patient tolerated the procedure well. Scope In: Scope Out: Findings:                 The esophagus was foreshortened with the GE                            junction at 30 cm. There was a large caliber distal                            ring.                           The stomach was grossly abnormal with very large                            hiatal hernia with slight torsion. There were                            retained contents within the hernia sac.                           The examined duodenum was normal.                           The cardia and gastric fundus revealed evidence of                            the hernia on retroflexion. Complications:            No immediate complications. Estimated Blood Loss:     Estimated blood loss: none. Impression:               1. Large complex hiatal hernia                           2. This is felt to be responsible for your chronic  pain. Recommendation:           1. Patient has a contact number available for                            emergencies. The signs and symptoms of potential                            delayed complications were discussed with the                            patient. Return to normal activities tomorrow.                            Written discharge instructions were provided to the                            patient.                           2. Resume previous diet.                           3. Continue present medications.                           4. Reconsider surgical correction. Docia Chuck. Henrene Pastor, MD 09/04/2020 11:11:06 AM This report has been signed electronically.

## 2020-09-04 NOTE — Progress Notes (Signed)
Vitals-SM  History reviewed.

## 2020-09-04 NOTE — Progress Notes (Signed)
Report to PACU, RN, vss, BBS= Clear.  

## 2020-09-04 NOTE — Patient Instructions (Signed)
Handout on Hiatal Hernia given. Resume previous diet.    YOU HAD AN ENDOSCOPIC PROCEDURE TODAY AT [facility]:   Refer to the procedure report that was given to you for any specific questions about what was found during the examination.  If the procedure report does not answer your questions, please call your gastroenterologist to clarify.  If you requested that your care partner not be given the details of your procedure findings, then the procedure report has been included in a sealed envelope for you to review at your convenience later.  YOU SHOULD EXPECT: Some feelings of bloating in the abdomen. Passage of more gas than usual.  Walking can help get rid of the air that was put into your GI tract during the procedure and reduce the bloating. If you had a lower endoscopy (such as a colonoscopy or flexible sigmoidoscopy) you may notice spotting of blood in your stool or on the toilet paper. If you underwent a bowel prep for your procedure, you may not have a normal bowel movement for a few days.  Please Note:  You might notice some irritation and congestion in your nose or some drainage.  This is from the oxygen used during your procedure.  There is no need for concern and it should clear up in a day or so.  SYMPTOMS TO REPORT IMMEDIATELY:  Following upper endoscopy (EGD)  Vomiting of blood or coffee ground material  New chest pain or pain under the shoulder blades  Painful or persistently difficult swallowing  New shortness of breath  Fever of 100F or higher  Black, tarry-looking stools  For urgent or emergent issues, a gastroenterologist can be reached at any hour by calling [phone]. Do not use MyChart messaging for urgent concerns.    DIET:  We do recommend a small meal at first, but then you may proceed to your regular diet.  Drink plenty of fluids but you should avoid alcoholic beverages for 24 hours.  ACTIVITY:  You should plan to take it easy for the rest of  today and you should NOT DRIVE or use heavy machinery until tomorrow (because of the sedation medicines used during the test).    FOLLOW UP: Our staff will call the number listed on your records 48-72 hours following your procedure to check on you and address any questions or concerns that you may have regarding the information given to you following your procedure. If we do not reach you, we will leave a message.  We will attempt to reach you two times.  During this call, we will ask if you have developed any symptoms of COVID 19. If you develop any symptoms (ie: fever, flu-like symptoms, shortness of breath, cough etc.) before then, please call [phone].  If you test positive for Covid 19 in the 2 weeks post procedure, please call and report this information to [place].    If any biopsies were taken you will be contacted by phone or by letter within the next 1-3 weeks.  Please call us at [phone] if you have not heard about the biopsies in 3 weeks.    SIGNATURES/CONFIDENTIALITY: You and/or your care partner have signed paperwork which will be entered into your electronic medical record.  These signatures attest to the fact that that the information above on your After Visit Summary has been reviewed and is understood.  Full responsibility of the confidentiality of this discharge information lies with you and/or your care-partner.

## 2020-09-05 ENCOUNTER — Telehealth: Payer: Self-pay | Admitting: Internal Medicine

## 2020-09-05 NOTE — Telephone Encounter (Signed)
Pt stated that she called surgical office to make an appt to address the large Hiatal Hernia. Pt stated that the office  is requesting a referral from [place]. Pt requesting Johnathan Hausen

## 2020-09-05 NOTE — Telephone Encounter (Signed)
Refer to Dr. Kaylyn Lim

## 2020-09-05 NOTE — Telephone Encounter (Signed)
Referral was sent to [facility]  for pt to see Dr. Kaylyn Lim for Hiatal Hernia.  Pt made aware

## 2020-09-06 ENCOUNTER — Telehealth: Payer: Self-pay | Admitting: *Deleted

## 2020-09-06 NOTE — Telephone Encounter (Signed)
  Follow up Call-  Call back number 09/04/2020  Post procedure Call Back phone  # [phone]  Permission to leave phone message Yes   [facility]

## 2020-09-06 NOTE — Telephone Encounter (Signed)
  Follow up Call-  Call back number 09/04/2020  Post procedure Call Back phone  # [phone]  Permission to leave phone message Yes     Patient questions:  Do you have a fever, pain , or abdominal swelling? No. Pain Score  0 *  Have you tolerated food without any problems? Yes.    Have you been able to return to your normal activities? Yes.    Do you have any questions about your discharge instructions: Diet   No. Medications  No. Follow up visit  No.  Do you have questions or concerns about your Care? No.  Actions: * If pain score is 4 or above: No action needed, pain <4.\  Have you developed a fever since your procedure? no  2.   Have you had an respiratory symptoms (SOB or cough) since your procedure? no  3.   Have you tested positive for COVID 19 since your procedure no  4.   Have you had any family members/close contacts diagnosed with the COVID 19 since your procedure?  no   If yes to any of these questions please route to Joylene John, RN and Joella Prince, RN

## 2020-09-07 ENCOUNTER — Other Ambulatory Visit: Payer: Self-pay

## 2020-09-07 ENCOUNTER — Other Ambulatory Visit (INDEPENDENT_AMBULATORY_CARE_PROVIDER_SITE_OTHER): Payer: Medicare Other

## 2020-09-07 ENCOUNTER — Telehealth: Payer: Self-pay | Admitting: Internal Medicine

## 2020-09-07 DIAGNOSIS — R197 Diarrhea, unspecified: Secondary | ICD-10-CM

## 2020-09-07 DIAGNOSIS — K5010 Crohn's disease of large intestine without complications: Secondary | ICD-10-CM

## 2020-09-07 LAB — CBC WITH DIFFERENTIAL/PLATELET
Basophils Absolute: 0.1 10*3/uL (ref 0.0–0.1)
Basophils Relative: 0.7 % (ref 0.0–3.0)
Eosinophils Absolute: 0.5 10*3/uL (ref 0.0–0.7)
Eosinophils Relative: 4.1 % (ref 0.0–5.0)
HCT: 35 % — ABNORMAL LOW (ref 36.0–46.0)
Hemoglobin: 11.7 g/dL — ABNORMAL LOW (ref 12.0–15.0)
Lymphocytes Relative: 18.1 % (ref 12.0–46.0)
Lymphs Abs: 2 10*3/uL (ref 0.7–4.0)
MCHC: 33.4 g/dL (ref 30.0–36.0)
MCV: 89.2 fl (ref 78.0–100.0)
Monocytes Absolute: 0.8 10*3/uL (ref 0.1–1.0)
Monocytes Relative: 7.4 % (ref 3.0–12.0)
Neutro Abs: 7.8 10*3/uL — ABNORMAL HIGH (ref 1.4–7.7)
Neutrophils Relative %: 69.7 % (ref 43.0–77.0)
Platelets: 404 10*3/uL — ABNORMAL HIGH (ref 150.0–400.0)
RBC: 3.92 Mil/uL (ref 3.87–5.11)
RDW: 14.9 % (ref 11.5–15.5)
WBC: 11.2 10*3/uL — ABNORMAL HIGH (ref 4.0–10.5)

## 2020-09-07 LAB — COMPREHENSIVE METABOLIC PANEL
ALT: 12 U/L (ref 0–35)
AST: 14 U/L (ref 0–37)
Albumin: 3.7 g/dL (ref 3.5–5.2)
Alkaline Phosphatase: 92 U/L (ref 39–117)
BUN: 26 mg/dL — ABNORMAL HIGH (ref 6–23)
CO2: 25 mEq/L (ref 19–32)
Calcium: 9.2 mg/dL (ref 8.4–10.5)
Chloride: 105 mEq/L (ref 96–112)
Creatinine, Ser: 1.07 mg/dL (ref 0.40–1.20)
GFR: 50.92 mL/min — ABNORMAL LOW (ref >60.00)
Glucose, Bld: 89 mg/dL (ref 70–99)
Potassium: 3.9 mEq/L (ref 3.5–5.1)
Sodium: 137 mEq/L (ref 135–145)
Total Bilirubin: 0.3 mg/dL (ref 0.2–1.2)
Total Protein: 7.2 g/dL (ref 6.0–8.3)

## 2020-09-07 NOTE — Telephone Encounter (Signed)
Pt given recommendations by MD. Pt informed to come by the lab here M-F 7:30-5:00. Pt stated that she would come to the lab this afternoon. Pt knows to continue Lialda. Pt states that she has not taken the Creon in ages. Please advise if pt is supposed to be taking the Creon.

## 2020-09-07 NOTE — Telephone Encounter (Signed)
[name] Pt calling Pt stated that's she has had Left side abdominal pain for two weeks that radiates to back. Pt states that's she has 4 or 5 BMs this AM and is starting to have some bleeding per rectum. Pt states that 2 of the BMs this AM did have some BRB. Pt states that she thinks that it is her colitis. Please advise as DOD

## 2020-09-07 NOTE — Telephone Encounter (Signed)
Recent note from Dr. Henrene Pastor reviewed; she does have a history of predominantly left-sided Crohn's disease but has been off of mesalamine therapy for about 6 months.  Also history of pancreatic insufficiency.  "Colitis" flares have been treated periodic with antibiotics in the past.   I would recommend CBC, CMP and C. difficile PCR plus fecal calprotectin Continue Creon at dose recommended by Dr. Henrene Pastor Once stool studies submitted we can resume Lialda 2.4 g daily until we have information back regarding inflammation in the stool and exclusion of infection Thank you

## 2020-09-07 NOTE — Telephone Encounter (Signed)
Thanks Ulice Dash. She's quite complicated.Marland KitchenMarland KitchenMarland KitchenAgree with plans. [name], check fecal pancreatic elastase. Her pain is secondary to large H/H (going to see surgeon--again) Her diarrhea issues are most often IBS

## 2020-09-07 NOTE — Telephone Encounter (Signed)
Additional stool order added.

## 2020-09-08 ENCOUNTER — Other Ambulatory Visit: Payer: Medicare Other

## 2020-09-08 DIAGNOSIS — K5010 Crohn's disease of large intestine without complications: Secondary | ICD-10-CM

## 2020-09-08 DIAGNOSIS — R197 Diarrhea, unspecified: Secondary | ICD-10-CM

## 2020-09-08 IMAGING — DX DG CHEST 2V
2 series · 2 of 2 positions shown · non-contrast
Comparison: None.

CLINICAL DATA: Respiratory crackles at the left lung base on
physical examination.

EXAM:
CHEST - 2 VIEW

[chest pa]
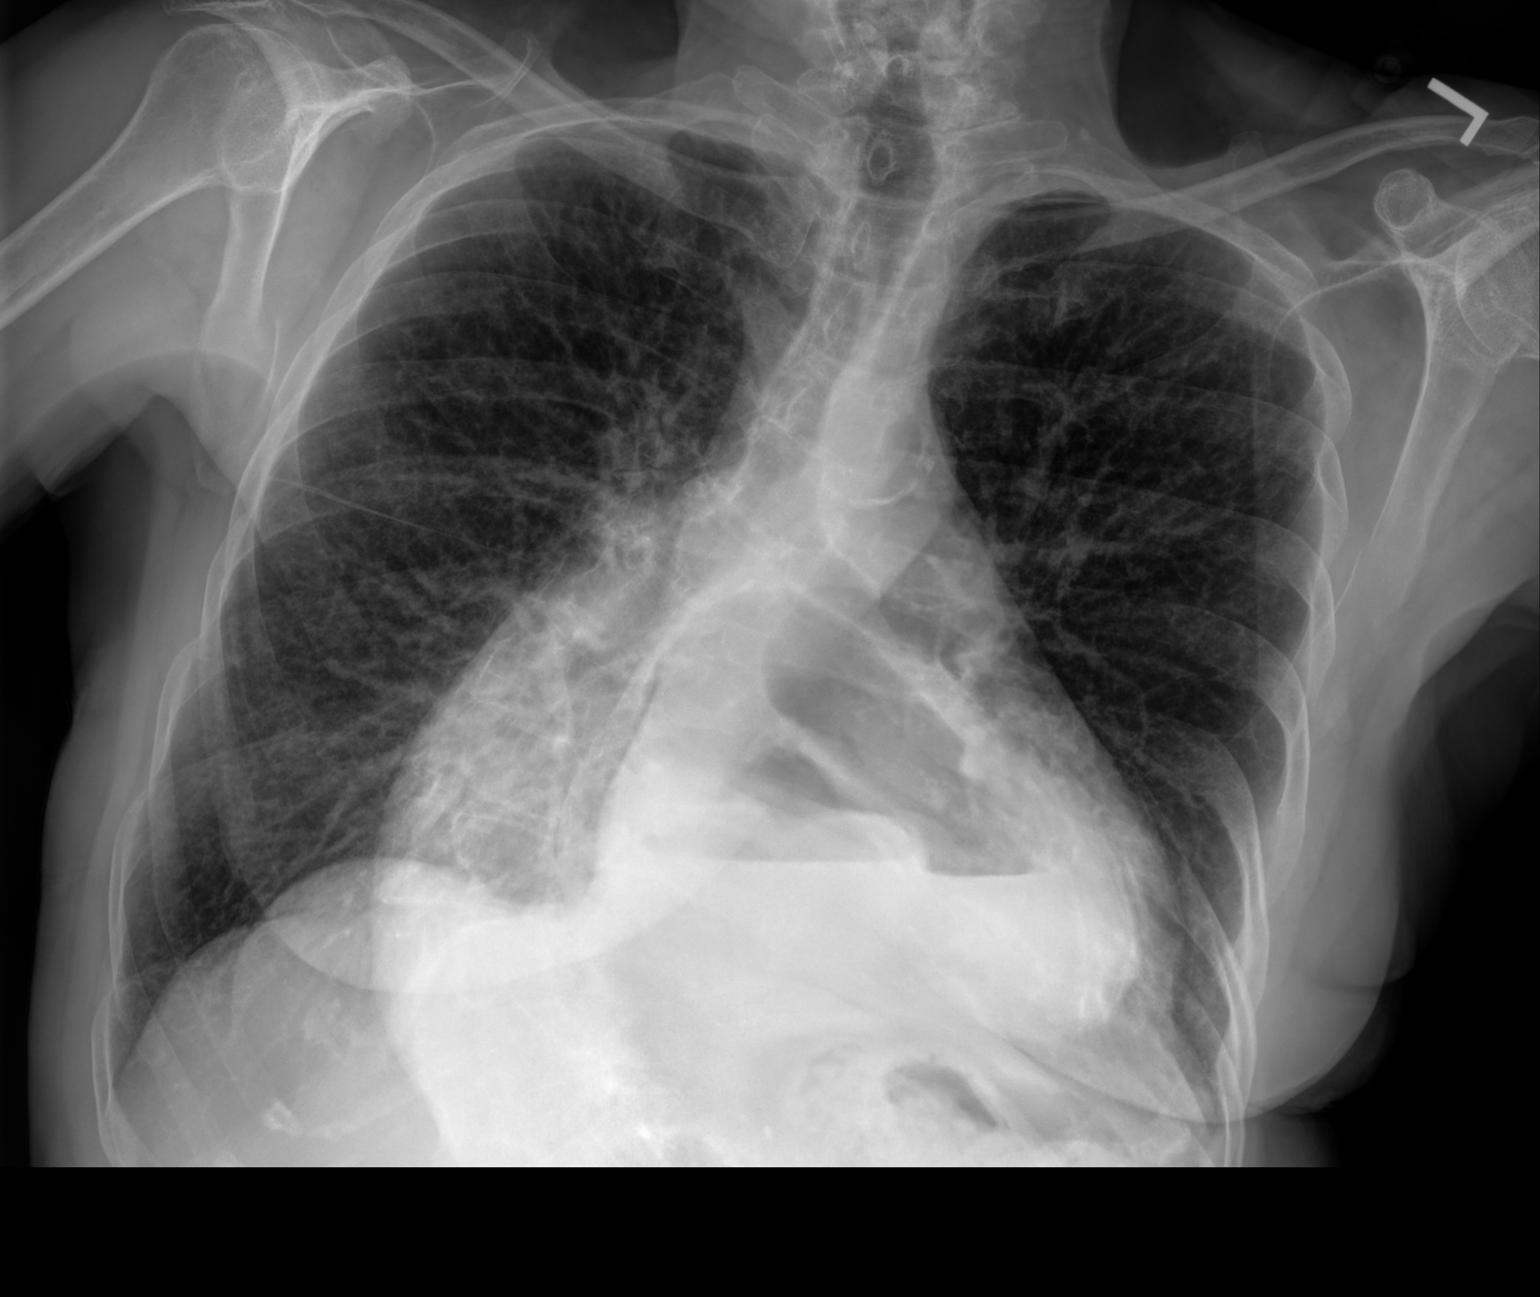

[chest lat]
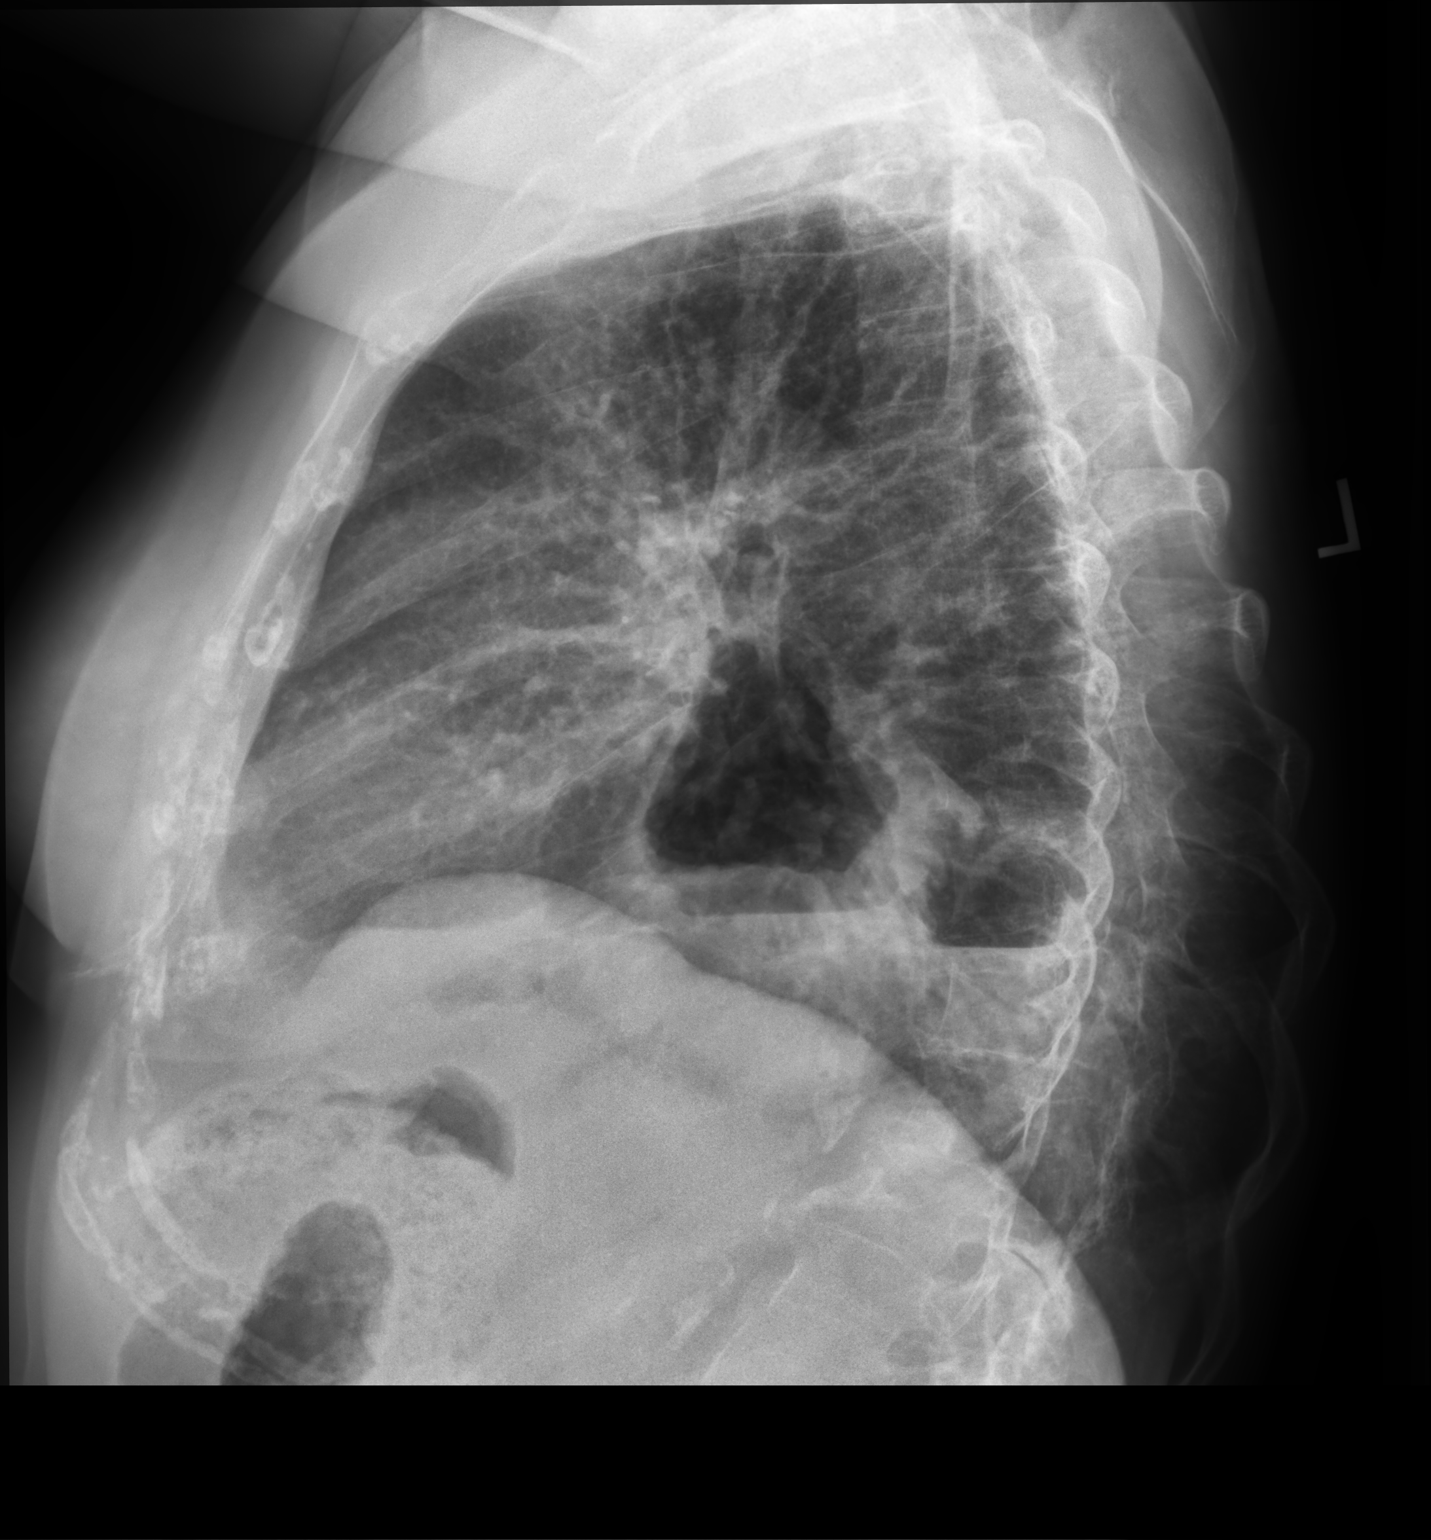

[2 of 2 positions shown; findings below may reference images not displayed]

FINDINGS: The heart is enlarged. There is tortuosity and calcification of the
thoracic aorta. Chronic appearing bronchitic and emphysematous lung
changes without definite acute infiltrate or effusion. There is a
large hiatal hernia with some surrounding atelectasis. Mild
eventration of the right hemidiaphragm. The bony thorax is grossly
intact.
IMPRESSION: 1. Cardiac enlargement.
2. Large hiatal hernia with some surrounding atelectasis.
3. Chronic appearing bronchitic and emphysematous lung changes
without acute overlying pulmonary process.

## 2020-09-09 ENCOUNTER — Other Ambulatory Visit: Payer: Self-pay | Admitting: Internal Medicine

## 2020-09-09 DIAGNOSIS — J302 Other seasonal allergic rhinitis: Secondary | ICD-10-CM

## 2020-09-09 LAB — CLOSTRIDIUM DIFFICILE BY PCR: Toxigenic C. Difficile by PCR: NEGATIVE

## 2020-09-13 ENCOUNTER — Ambulatory Visit: Payer: Medicare Other | Admitting: Internal Medicine

## 2020-09-14 LAB — CALPROTECTIN, FECAL: Calprotectin, Fecal: 514 ug/g — ABNORMAL HIGH (ref 0–120)

## 2020-09-15 LAB — PANCREATIC ELASTASE, FECAL: Pancreatic Elastase-1, Stool: 104 mcg/g — ABNORMAL LOW

## 2020-09-18 ENCOUNTER — Other Ambulatory Visit: Payer: Self-pay

## 2020-09-19 ENCOUNTER — Encounter: Payer: Self-pay | Admitting: Internal Medicine

## 2020-09-19 ENCOUNTER — Ambulatory Visit (INDEPENDENT_AMBULATORY_CARE_PROVIDER_SITE_OTHER): Payer: Medicare Other | Admitting: Internal Medicine

## 2020-09-19 VITALS — BP 110/70 | Temp 97.8°F | Ht 60.0 in | Wt 123.0 lb

## 2020-09-19 DIAGNOSIS — Z23 Encounter for immunization: Secondary | ICD-10-CM

## 2020-09-19 DIAGNOSIS — N183 Chronic kidney disease, stage 3 unspecified: Secondary | ICD-10-CM | POA: Insufficient documentation

## 2020-09-19 DIAGNOSIS — K219 Gastro-esophageal reflux disease without esophagitis: Secondary | ICD-10-CM

## 2020-09-19 DIAGNOSIS — F339 Major depressive disorder, recurrent, unspecified: Secondary | ICD-10-CM

## 2020-09-19 DIAGNOSIS — Z0000 Encounter for general adult medical examination without abnormal findings: Secondary | ICD-10-CM | POA: Diagnosis not present

## 2020-09-19 DIAGNOSIS — I10 Essential (primary) hypertension: Secondary | ICD-10-CM | POA: Diagnosis not present

## 2020-09-19 DIAGNOSIS — J302 Other seasonal allergic rhinitis: Secondary | ICD-10-CM | POA: Diagnosis not present

## 2020-09-19 DIAGNOSIS — K449 Diaphragmatic hernia without obstruction or gangrene: Secondary | ICD-10-CM

## 2020-09-19 DIAGNOSIS — E785 Hyperlipidemia, unspecified: Secondary | ICD-10-CM | POA: Diagnosis not present

## 2020-09-19 DIAGNOSIS — K50119 Crohn's disease of large intestine with unspecified complications: Secondary | ICD-10-CM

## 2020-09-19 LAB — LIPID PANEL
Cholesterol: 142 mg/dL (ref 0–200)
HDL: 45.2 mg/dL (ref >39.00)
LDL Cholesterol: 74 mg/dL (ref 0–99)
NonHDL: 96.84
Total CHOL/HDL Ratio: 3
Triglycerides: 115 mg/dL (ref 0.0–149.0)
VLDL: 23 mg/dL (ref 0.0–40.0)

## 2020-09-19 LAB — CBC WITH DIFFERENTIAL/PLATELET
Basophils Absolute: 0.1 10*3/uL (ref 0.0–0.1)
Basophils Relative: 0.9 % (ref 0.0–3.0)
Eosinophils Absolute: 0.8 10*3/uL — ABNORMAL HIGH (ref 0.0–0.7)
Eosinophils Relative: 6.6 % — ABNORMAL HIGH (ref 0.0–5.0)
HCT: 35.4 % — ABNORMAL LOW (ref 36.0–46.0)
Hemoglobin: 11.6 g/dL — ABNORMAL LOW (ref 12.0–15.0)
Lymphocytes Relative: 10 % — ABNORMAL LOW (ref 12.0–46.0)
Lymphs Abs: 1.2 10*3/uL (ref 0.7–4.0)
MCHC: 32.7 g/dL (ref 30.0–36.0)
MCV: 90.3 fl (ref 78.0–100.0)
Monocytes Absolute: 0.6 10*3/uL (ref 0.1–1.0)
Monocytes Relative: 5.2 % (ref 3.0–12.0)
Neutro Abs: 9 10*3/uL — ABNORMAL HIGH (ref 1.4–7.7)
Neutrophils Relative %: 77.3 % — ABNORMAL HIGH (ref 43.0–77.0)
Platelets: 458 10*3/uL — ABNORMAL HIGH (ref 150.0–400.0)
RBC: 3.92 Mil/uL (ref 3.87–5.11)
RDW: 14.9 % (ref 11.5–15.5)
WBC: 11.7 10*3/uL — ABNORMAL HIGH (ref 4.0–10.5)

## 2020-09-19 LAB — COMPREHENSIVE METABOLIC PANEL
ALT: 10 U/L (ref 0–35)
AST: 13 U/L (ref 0–37)
Albumin: 3.7 g/dL (ref 3.5–5.2)
Alkaline Phosphatase: 87 U/L (ref 39–117)
BUN: 35 mg/dL — ABNORMAL HIGH (ref 6–23)
CO2: 22 mEq/L (ref 19–32)
Calcium: 9.1 mg/dL (ref 8.4–10.5)
Chloride: 106 mEq/L (ref 96–112)
Creatinine, Ser: 1.25 mg/dL — ABNORMAL HIGH (ref 0.40–1.20)
GFR: 42.24 mL/min — ABNORMAL LOW (ref >60.00)
Glucose, Bld: 87 mg/dL (ref 70–99)
Potassium: 3.9 mEq/L (ref 3.5–5.1)
Sodium: 137 mEq/L (ref 135–145)
Total Bilirubin: 0.4 mg/dL (ref 0.2–1.2)
Total Protein: 7.2 g/dL (ref 6.0–8.3)

## 2020-09-19 MED ORDER — DULOXETINE HCL 60 MG PO CPEP: 60.0000 mg | ORAL_CAPSULE | Freq: Two times a day (BID) | ORAL | 1 refills | Status: DC

## 2020-09-19 MED ORDER — FLUTICASONE PROPIONATE 50 MCG/ACT NA SUSP: 2.0000 | Freq: Every day | NASAL | 1 refills | Status: DC

## 2020-09-19 MED ORDER — LOSARTAN POTASSIUM 100 MG PO TABS: 100.0000 mg | ORAL_TABLET | Freq: Every day | ORAL | 1 refills | Status: DC

## 2020-09-19 MED ORDER — CETIRIZINE HCL 10 MG PO TABS: 10.0000 mg | ORAL_TABLET | Freq: Every day | ORAL | 1 refills | Status: DC

## 2020-09-19 MED ORDER — OMEPRAZOLE 40 MG PO CPDR: 40.0000 mg | DELAYED_RELEASE_CAPSULE | Freq: Two times a day (BID) | ORAL | 1 refills | Status: DC

## 2020-09-19 NOTE — Progress Notes (Signed)
Established Patient Office Visit     This visit occurred during the SARS-CoV-2 public health emergency.  Safety protocols were in place, including screening questions prior to the visit, additional usage of staff PPE, and extensive cleaning of exam room while observing appropriate contact time as indicated for disinfecting solutions.    CC/Reason for Visit: Subsequent Medicare wellness visit  HPI: [name] is a 75 y.o. female who is coming in today for the above mentioned reasons. Past Medical History is significant for: Crohn's disease, hypertension, pancreatic insufficiency, hiatal hernia with GERD, bilateral knee osteoarthritis.  She continues to have issues surrounding her hiatal hernia and has finally decided to pursue surgical consultation again.  She has appointment to see Dr. [facility] Done in October.  She has routine eye and dental care.  She will be due for mammogram in October.  She is overdue for conjugated pneumonia vaccine, flu, Tdap, shingles, COVID booster.   Past Medical/Surgical History: Past Medical History:  Diagnosis Date   Allergies    Bruises easily    Complication of anesthesia    Crohn's disease ([place])    GI Dr Beulah Gandy in [place], [place]    Deviated septum    GERD (gastroesophageal reflux disease)    Headache    occ    Hiatal hernia    HTN (hypertension)    pcp Dr Margaretann Loveless  in [place], [place]    Neuropathy    fingers,periodically    Osteoarthritis    Pancreatic insufficiency    PONV (postoperative nausea and vomiting)    Rheumatoid arthritis ([place])    Scoliosis    Squamous cell carcinoma of arm    left    Whooping cough 2015   Whooping cough with pneumonia    2017    Past Surgical History:  Procedure Laterality Date   CATARACT EXTRACTION, BILATERAL  2018   with IOC implant    COLONOSCOPY     DEBRIDEMENT AND CLOSURE WOUND Right 10/01/2018   Procedure: Excision of right knee wound with placement of ACell and Pravena;   Surgeon: Wallace Going, DO;  Location: [facility];  Service: Plastics;  Laterality: Right;  30 min   JOINT REPLACEMENT  2020   right knee   knee fracture      NASAL FRACTURE SURGERY     needed b/c she was getting frequent sinus infections    TOTAL KNEE ARTHROPLASTY Right 07/28/2018   Procedure: TOTAL KNEE ARTHROPLASTY;  Surgeon: Paralee Cancel, MD;  Location: [facility];  Service: Orthopedics;  Laterality: Right;   TOTAL KNEE ARTHROPLASTY Left 03/02/2019   Procedure: TOTAL KNEE ARTHROPLASTY;  Surgeon: Paralee Cancel, MD;  Location: [facility];  Service: Orthopedics;  Laterality: Left;  70 mins   UPPER GASTROINTESTINAL ENDOSCOPY      Social History:  reports that she quit smoking about 32 years ago. Her smoking use included cigarettes. She has a 30.00 pack-year smoking history. She has never used smokeless tobacco. She reports that she does not currently use alcohol. She reports that she does not use drugs.  Allergies: Allergies  Allergen Reactions   Adhesive [Tape]     Redness and skin peeling   Aspirin     Intestinal Bleeding   Ciprofloxacin Nausea And Vomiting   Codeine Nausea And Vomiting   Demerol [Meperidine Hcl] Nausea And Vomiting   Lactose Intolerance (Gi) Other (See Comments)    Pt has Crohn's    Latex     Redness  and skin peeling   Other     Nuts-itching in throat  Seeds-stomach issues with chrons     Septra [Sulfamethoxazole-Trimethoprim] Nausea Only    Family History:  Family History  Problem Relation Age of Onset   Diabetes Father    Parkinson's disease Father    Colon cancer Other    Asthma Neg Hx    Esophageal cancer Neg Hx    Pancreatic cancer Neg Hx    Liver disease Neg Hx    Stomach cancer Neg Hx      Current Outpatient Medications:    Calcium Citrate-Vitamin D (CALCIUM + D PO), Take 1 tablet by mouth 2 (two) times daily., Disp: , Rfl:    colestipol (COLESTID) 1 g tablet, Take 2 tablets (2 g total) by mouth 2 (two) times daily., Disp:  120 tablet, Rfl: 3   dicyclomine (BENTYL) 20 MG tablet, Take 1 tablet (20 mg total) by mouth 4 (four) times daily as needed for spasms., Disp: 360 tablet, Rfl: 3   HYDROcodone-acetaminophen (NORCO) 5-325 MG tablet, Take 1 tablet by mouth 2 (two) times daily as needed., Disp: 20 tablet, Rfl: 0   loperamide (IMODIUM) 2 MG capsule, Take 1 capsule (2 mg total) by mouth 3 (three) times daily as needed for diarrhea or loose stools., Disp: 180 capsule, Rfl: 6   mesalamine (LIALDA) 1.2 g EC tablet, Take 4 tablets (4.8 g total) by mouth daily with breakfast., Disp: 360 tablet, Rfl: 3   Misc Natural Product Nasal (NASAL CLEANSE RINSE MIX NA), Place 1 Dose into the nose daily as needed (congestion)., Disp: , Rfl:    Multiple Vitamin (MULTIVITAMIN WITH MINERALS) TABS tablet, Take 1 tablet by mouth daily., Disp: , Rfl:    Omeprazole Magnesium (PRILOSEC OTC PO), , Disp: , Rfl:    rifaximin (XIFAXAN) 550 MG TABS tablet, Take 1 tablet (550 mg total) by mouth 3 (three) times daily., Disp: 42 tablet, Rfl: 1   triamcinolone cream (KENALOG) 0.1 %, APPLY TO AFFECTED AREA TWICE A DAY, Disp: 90 g, Rfl: 2   cetirizine (ZYRTEC) 10 MG tablet, Take 1 tablet (10 mg total) by mouth daily., Disp: 90 tablet, Rfl: 1   DULoxetine (CYMBALTA) 60 MG capsule, Take 1 capsule (60 mg total) by mouth 2 (two) times daily., Disp: 180 capsule, Rfl: 1   fluticasone (FLONASE) 50 MCG/ACT nasal spray, Place 2 sprays into both nostrils daily., Disp: 48 mL, Rfl: 1   losartan (COZAAR) 100 MG tablet, Take 1 tablet (100 mg total) by mouth daily., Disp: 90 tablet, Rfl: 1   omeprazole (PRILOSEC) 40 MG capsule, Take 1 capsule (40 mg total) by mouth 2 (two) times daily., Disp: 180 capsule, Rfl: 1  Review of Systems:  Constitutional: Denies fever, chills, diaphoresis, appetite change and fatigue.  HEENT: Denies photophobia, eye pain, redness, hearing loss, ear pain, congestion, sore throat, rhinorrhea, sneezing, mouth sores, trouble swallowing, neck pain,  neck stiffness and tinnitus.   Respiratory: Denies SOB, DOE, cough, chest tightness,  and wheezing.   Cardiovascular: Denies chest pain, palpitations and leg swelling.  Gastrointestinal: Positive for nausea, vomiting, abdominal pain, diarrhea, constipation, blood in stool and abdominal distention.  Genitourinary: Denies dysuria, urgency, frequency, hematuria, flank pain and difficulty urinating.  Endocrine: Denies: hot or cold intolerance, sweats, changes in hair or nails, polyuria, polydipsia. Musculoskeletal: Denies myalgias, back pain, joint swelling, arthralgias and gait problem.  Skin: Denies pallor, rash and wound.  Neurological: Denies dizziness, seizures, syncope, weakness, light-headedness, numbness and headaches.  Hematological: Denies  adenopathy. Easy bruising, personal or family bleeding history  Psychiatric/Behavioral: Denies suicidal ideation, mood changes, confusion, nervousness, sleep disturbance and agitation    Physical Exam: Vitals:   09/19/20 1057  BP: 110/70  Temp: 97.8 F (36.6 C)  TempSrc: Oral  Weight: 123 lb (55.8 kg)  Height: 5' (1.524 m)    Body mass index is 24.02 kg/m.   Constitutional: NAD, calm, comfortable Eyes: PERRL, lids and conjunctivae normal, wears corrective lenses ENMT: Mucous membranes are moist. Posterior pharynx clear of any exudate or lesions. Normal dentition. Tympanic membrane is pearly white, no erythema or bulging. Neck: normal, supple, no masses, no thyromegaly Respiratory: clear to auscultation bilaterally, no wheezing, no crackles. Normal respiratory effort. No accessory muscle use.  Cardiovascular: Regular rate and rhythm, no murmurs / rubs / gallops. No extremity edema. 2+ pedal pulses. No carotid bruits.  Abdomen: no tenderness, no masses palpated. No hepatosplenomegaly. Bowel sounds positive.  Musculoskeletal: no clubbing / cyanosis. No joint deformity upper and lower extremities. Good ROM, no contractures. Normal muscle tone.   Skin: no rashes, lesions, ulcers. No induration Neurologic: CN 2-12 grossly intact. Sensation intact, DTR normal. Strength 5/5 in all 4.  Psychiatric: Normal judgment and insight. Alert and oriented x 3. Normal mood.   Subsequent Medicare wellness visit   1. Risk factors, based on past  M,S,F -cardiovascular disease risk factors include age, gender, history of hypertension   2.  Physical activities: Sedentary other than activities of daily living   3.  Depression/mood: History of depression but mood is currently stable   4.  Hearing: She has significant hearing impairment and wears hearing aids bilaterally   5.  ADL's: Independent in all ADLs   6.  Fall risk: Low fall risk   7.  Home safety: No problems identified   8.  Height weight, and visual acuity: height and weight as above, vision:  Vision Screening   Right eye Left eye Both eyes  Without correction     With correction 20/20 20/20 20/20      9.  Counseling: Advised to update her immunization status   10. Lab orders based on risk factors: Laboratory update will be reviewed   11. Referral : None today   12. Care plan: Follow-up with me in 6 months   13. Cognitive assessment: No cognitive impairment   14. Screening: Patient provided with a written and personalized 5-10 year screening schedule in the AVS. yes   15. Provider List Update: PCP, gastroenterologist Dr. Henrene Pastor.  16. Advance Directives: Full code   17. Opioids: Patient is not on any opioid prescriptions and has no risk factors for a substance use disorder.   [facility] Office Visit from 03/15/2020 in [facility] at [facility]  PHQ-9 Total Score 1       Fall Risk  09/19/2020 03/22/2020 09/16/2019 06/08/2019  Falls in the past year? 0 0 0 0  Number falls in past yr: 0 0 0 0  Injury with Fall? 0 0 0 0      Impression and Plan:  Encounter for subsequent annual wellness visit (AWV) in Medicare patient  Seasonal allergies - Plan: cetirizine  (ZYRTEC) 10 MG tablet, fluticasone (FLONASE) 50 MCG/ACT nasal spray  Depression, recurrent ([place]) - Plan: DULoxetine (CYMBALTA) 60 MG capsule  Essential hypertension - Plan: losartan (COZAAR) 100 MG tablet, CBC with Differential/Platelet, Comprehensive metabolic panel  Gastroesophageal reflux disease without esophagitis - Plan: omeprazole (PRILOSEC) 40 MG capsule  Hyperlipidemia, unspecified hyperlipidemia type - Plan: Lipid panel  Hiatal hernia  Crohn's disease of colon with complication ([place])  Need for vaccination against Streptococcus pneumoniae  -She has routine eye and dental care. -She will receive PCV 13 in office today, she will get Tdap, flu, COVID booster and shingles vaccines at the pharmacy. -Healthy lifestyle discussed in detail. -Labs to be updated today. -She follows with GI routinely, is unsure when she will get her next colonoscopy. -She will schedule her mammogram for October. -She has elected to defer further cancer screening due to age. -DEXA scan will be requested for osteoporosis screening.    Patient Instructions  -Nice seeing you today!!  -Lab work today; will notify you once results are available.  -Pneumonia vaccine today.  -Remember: COVID booster, flu, shingles and tetanus vaccines at your pharmacy.  -Schedule follow up in 6 months.    Lelon Frohlich, MD [facility]

## 2020-09-19 NOTE — Addendum Note (Signed)
Addended by: Amanda Cockayne on: 09/19/2020 11:36 AM   Modules accepted: Orders

## 2020-09-19 NOTE — Addendum Note (Signed)
Addended by: Westley Hummer B on: 09/19/2020 12:10 PM   Modules accepted: Orders

## 2020-09-19 NOTE — Patient Instructions (Signed)
-  Nice seeing you today!!  -Lab work today; will notify you once results are available.  -Pneumonia vaccine today.  -Remember: COVID booster, flu, shingles and tetanus vaccines at your pharmacy.  -Schedule follow up in 6 months.

## 2020-09-25 ENCOUNTER — Other Ambulatory Visit: Payer: Self-pay | Admitting: Internal Medicine

## 2020-09-25 DIAGNOSIS — Z1231 Encounter for screening mammogram for malignant neoplasm of breast: Secondary | ICD-10-CM

## 2020-10-04 ENCOUNTER — Encounter: Payer: Self-pay | Admitting: Internal Medicine

## 2020-10-04 ENCOUNTER — Ambulatory Visit (INDEPENDENT_AMBULATORY_CARE_PROVIDER_SITE_OTHER): Payer: Medicare Other | Admitting: Internal Medicine

## 2020-10-04 VITALS — BP 138/80 | HR 73 | Ht 60.0 in | Wt 124.0 lb

## 2020-10-04 DIAGNOSIS — R197 Diarrhea, unspecified: Secondary | ICD-10-CM | POA: Diagnosis not present

## 2020-10-04 DIAGNOSIS — K625 Hemorrhage of anus and rectum: Secondary | ICD-10-CM | POA: Diagnosis not present

## 2020-10-04 DIAGNOSIS — K219 Gastro-esophageal reflux disease without esophagitis: Secondary | ICD-10-CM

## 2020-10-04 DIAGNOSIS — K50919 Crohn's disease, unspecified, with unspecified complications: Secondary | ICD-10-CM | POA: Diagnosis not present

## 2020-10-04 DIAGNOSIS — K449 Diaphragmatic hernia without obstruction or gangrene: Secondary | ICD-10-CM

## 2020-10-04 MED ORDER — PLENVU 140 G PO SOLR: 1.0000 | Freq: Once | ORAL | 0 refills | Status: AC

## 2020-10-04 MED ORDER — OMEPRAZOLE 40 MG PO CPDR
40.0000 mg | DELAYED_RELEASE_CAPSULE | Freq: Three times a day (TID) | ORAL | 3 refills | Status: DC
Start: 2020-10-04 — End: 2021-01-31

## 2020-10-04 NOTE — Patient Instructions (Addendum)
If you are age 74 or older, your body mass index should be between 23-30. Your Body mass index is 24.22 kg/m. If this is out of the aforementioned range listed, please consider follow up with your Primary Care Provider.  If you are age 31 or younger, your body mass index should be between 19-25. Your Body mass index is 24.22 kg/m. If this is out of the aformentioned range listed, please consider follow up with your Primary Care Provider.   _________________________________________________________  We have sent the following medications to your pharmacy for you to pick up at your convenience:  Omeprazole  The [facility] providers would like to encourage you to use [facility] to communicate with providers for non-urgent requests or questions.  Due to long hold times on the telephone, sending your provider a message by [facility] may be a faster and more efficient way to get a response.  Please allow 48 business hours for a response.  Please remember that this is for non-urgent requests.   You have been scheduled for a colonoscopy. Please follow written instructions given to you at your visit today.  Please pick up your prep supplies at the pharmacy within the next 1-3 days. If you use inhalers (even only as needed), please bring them with you on the day of your procedure.

## 2020-10-04 NOTE — Progress Notes (Signed)
HISTORY OF PRESENT ILLNESS:  [name] is a 75 y.o. female with complicated GI history as outlined on multiple previous occasions.  Please see the office note dated August 21, 2020.  GI diagnoses include Crohn's colitis, GERD, large symptomatic paraesophageal hernia, chronic intermittent diarrhea with constipation, query pancreatic insufficiency, chronic nausea, and chronic intermittent lower abdominal pain.  She is also felt to have irritable bowel syndrome.  Patient tells me that after being placed on Colestid 2 g twice daily for diarrhea improved from 5 or 6 watery bowel movements per day to rare less frequent watery bowel movements as well as occasional constipation and days without bowel movements.  She continues to take Imodium 1-3 times daily.  She is back on Lialda.  She tells me that she takes her omeprazole 40 mg 3 times daily (prescribed twice daily) as she feels this helps her.  She wants me to rewrite the prescription to reflect that.  She is anticipating surgical follow-up for obstructive symptomatic large paraesophageal hernia.  Last upper endoscopy 1 month ago.  She contacted the office recently with complaints of diarrhea.  However, she was worried about rectal bleeding which occurred on multiple occasions throughout the course of the day.  Last colonoscopy elsewhere May 2019.  At that time the ileum was normal.  Chronic colitis noted in the left colon.  Pseudopolyps in the cecum removed.  Internal hemorrhoids present.  Biopsies throughout the colon revealed inactive chronic colitis.  Recent blood work did show an elevated fecal calprotectin.  Also decreased fecal pancreatic elastase.  She continues with her episodic left shoulder pain/chest pain.  No new complaints.  She feels colonoscopy is indicated.  REVIEW OF SYSTEMS:  All non-GI ROS negative unless otherwise stated in the HPI except for arthritis, cough, hearing problems, itching, shortness of breath  Past Medical History:   Diagnosis Date   Allergies    Bruises easily    Complication of anesthesia    Crohn's disease ([place])    GI Dr Beulah Gandy in [place], [place]    Deviated septum    GERD (gastroesophageal reflux disease)    Headache    occ    Hiatal hernia    HTN (hypertension)    pcp Dr Margaretann Loveless  in [place], [place]    Kidney disease, chronic, stage III (GFR 30-59 ml/min) ([place])    Neuropathy    fingers,periodically    Osteoarthritis    Pancreatic insufficiency    PONV (postoperative nausea and vomiting)    Rheumatoid arthritis ([place])    Scoliosis    Squamous cell carcinoma of arm    left    Whooping cough 2015   Whooping cough with pneumonia    2017    Past Surgical History:  Procedure Laterality Date   CATARACT EXTRACTION, BILATERAL  2018   with IOC implant    COLONOSCOPY     DEBRIDEMENT AND CLOSURE WOUND Right 10/01/2018   Procedure: Excision of right knee wound with placement of ACell and Pravena;  Surgeon: Wallace Going, DO;  Location: [facility];  Service: Plastics;  Laterality: Right;  30 min   JOINT REPLACEMENT  2020   right knee   knee fracture      NASAL FRACTURE SURGERY     needed b/c she was getting frequent sinus infections    TOTAL KNEE ARTHROPLASTY Right 07/28/2018   Procedure: TOTAL KNEE ARTHROPLASTY;  Surgeon: Paralee Cancel, MD;  [facility];  Service: Orthopedics;  Laterality:  Right;   TOTAL KNEE ARTHROPLASTY Left 03/02/2019   Procedure: TOTAL KNEE ARTHROPLASTY;  Surgeon: Paralee Cancel, MD;  [facility];  Service: Orthopedics;  Laterality: Left;  70 mins   UPPER GASTROINTESTINAL ENDOSCOPY      Social History [name]  reports that she quit smoking about 32 years ago. Her smoking use included cigarettes. She has a 30.00 pack-year smoking history. She has never used smokeless tobacco. She reports that she does not currently use alcohol. She reports that she does not use drugs.  family history includes Colon cancer in an  other family member; Diabetes in her father; Parkinson's disease in her father.  Allergies  Allergen Reactions   Adhesive [Tape]     Redness and skin peeling   Aspirin     Intestinal Bleeding   Ciprofloxacin Nausea And Vomiting   Codeine Nausea And Vomiting   Demerol [Meperidine Hcl] Nausea And Vomiting   Lactose Intolerance (Gi) Other (See Comments)    Pt has Crohn's    Latex     Redness and skin peeling   Other     Nuts-itching in throat  Seeds-stomach issues with chrons     Septra [Sulfamethoxazole-Trimethoprim] Nausea Only       PHYSICAL EXAMINATION: Vital signs: BP 138/80   Pulse 73   Ht 5' (1.524 m)   Wt 124 lb (56.2 kg)   BMI 24.22 kg/m   Constitutional: generally well-appearing, no acute distress Psychiatric: alert and oriented x3, cooperative Eyes: extraocular movements intact, anicteric, conjunctiva pink Mouth: oral pharynx moist, no lesions Neck: supple no lymphadenopathy Back: Scoliosis Cardiovascular: heart regular rate and rhythm, no murmur Lungs: clear to auscultation bilaterally Abdomen: soft, nontender, nondistended, no obvious ascites, no peritoneal signs, normal bowel sounds, no organomegaly Rectal: Deferred until colonoscopy Extremities: no clubbing, cyanosis, or lower extremity edema bilaterally Skin: no lesions on visible extremities Neuro: No focal deficits.  Cranial nerves intact  ASSESSMENT:  1.  Chronic diarrhea.  Seems to be improved on Colestid 2.  Recent problems with rectal bleeding 3.  History of Crohn's colitis.  Elevated fecal calprotectin 4.  Large paraesophageal hernia.  Symptomatic 5.  GERD.  She is taking her omeprazole 3 times daily.  She feels this helps 6.  IBS   PLAN:  1.  Colonoscopy to evaluate rectal bleeding, elevated fecal calprotectin, and diarrhea.The nature of the procedure, as well as the risks, benefits, and alternatives were carefully and thoroughly reviewed with the patient. Ample time for discussion and  questions allowed. The patient understood, was satisfied, and agreed to proceed. 2.  Continue other medications 3.  Keep surgical follow-up regarding symptomatic large paraesophageal hernia 4.  Prescription for omeprazole rewritten to 40 mg 3 times daily.  I told her that this is an unusual prescribing pattern.  It may be denied.  It may exacerbate diarrhea.  She understands.

## 2020-10-09 ENCOUNTER — Ambulatory Visit
Admission: RE | Admit: 2020-10-09 | Discharge: 2020-10-09 | Disposition: A | Discharge status: Home / Self Care | Payer: Medicare Other | Source: Ambulatory Visit | Attending: Internal Medicine | Admitting: Internal Medicine

## 2020-10-09 ENCOUNTER — Other Ambulatory Visit: Payer: Self-pay

## 2020-10-09 ENCOUNTER — Telehealth: Payer: Self-pay | Admitting: Plastic Surgery

## 2020-10-09 DIAGNOSIS — Z1231 Encounter for screening mammogram for malignant neoplasm of breast: Secondary | ICD-10-CM

## 2020-10-09 NOTE — Telephone Encounter (Signed)
Pt hasn't been seen since January 2021 and the ulcer pt called about is new. Pt will need referral from PCP to my understanding. Will consult with Catalina [place], PA when he comes into office to clarify.

## 2020-10-09 NOTE — Telephone Encounter (Signed)
Patient is experiencing an ulcer developed on her right leg a few weeks ago and has consistant yellow drainage. Patient was seen for surgery on 10/01/2018 for an excision of right knee wound. Please call to advise [phone]. Thank you.

## 2020-10-09 NOTE — Telephone Encounter (Signed)
Called pt, adv her of Matt's suggestions. Pt conveyed understanding.

## 2020-10-12 ENCOUNTER — Other Ambulatory Visit: Payer: Self-pay

## 2020-10-13 ENCOUNTER — Encounter: Payer: Self-pay | Admitting: Internal Medicine

## 2020-10-13 ENCOUNTER — Ambulatory Visit (INDEPENDENT_AMBULATORY_CARE_PROVIDER_SITE_OTHER): Payer: Medicare Other | Admitting: Internal Medicine

## 2020-10-13 VITALS — BP 120/78 | Temp 97.4°F | Wt 121.9 lb

## 2020-10-13 DIAGNOSIS — S81801A Unspecified open wound, right lower leg, initial encounter: Secondary | ICD-10-CM

## 2020-10-13 NOTE — Progress Notes (Signed)
Acute office Visit     This visit occurred during the SARS-CoV-2 public health emergency.  Safety protocols were in place, including screening questions prior to the visit, additional usage of staff PPE, and extensive cleaning of exam room while observing appropriate contact time as indicated for disinfecting solutions.    CC/Reason for Visit: Right leg wound  HPI: [name] is a 75 y.o. female who is coming in today for the above mentioned reasons.  For 3 weeks ago she noticed a blister over the posterior aspect of her right calf that has since then burst.  There is some mild surrounding erythema but no purulent drainage.  Past Medical/Surgical History: Past Medical History:  Diagnosis Date   Allergies    Bruises easily    Complication of anesthesia    Crohn's disease ([place])    GI Dr Beulah Gandy in [place], [place]    Deviated septum    GERD (gastroesophageal reflux disease)    Headache    occ    Hiatal hernia    HTN (hypertension)    pcp Dr Margaretann Loveless  in [place], [place]    Kidney disease, chronic, stage III (GFR 30-59 ml/min) ([place])    Neuropathy    fingers,periodically    Osteoarthritis    Pancreatic insufficiency    PONV (postoperative nausea and vomiting)    Rheumatoid arthritis ([place])    Scoliosis    Squamous cell carcinoma of arm    left    Whooping cough 2015   Whooping cough with pneumonia    2017    Past Surgical History:  Procedure Laterality Date   CATARACT EXTRACTION, BILATERAL  2018   with IOC implant    COLONOSCOPY     DEBRIDEMENT AND CLOSURE WOUND Right 10/01/2018   Procedure: Excision of right knee wound with placement of ACell and Pravena;  Surgeon: Wallace Going, DO;  Location: [facility];  Service: Plastics;  Laterality: Right;  30 min   JOINT REPLACEMENT  2020   right knee   knee fracture      NASAL FRACTURE SURGERY     needed b/c she was getting frequent sinus infections    TOTAL KNEE ARTHROPLASTY  Right 07/28/2018   Procedure: TOTAL KNEE ARTHROPLASTY;  Surgeon: Paralee Cancel, MD;  Location: [facility];  Service: Orthopedics;  Laterality: Right;   TOTAL KNEE ARTHROPLASTY Left 03/02/2019   Procedure: TOTAL KNEE ARTHROPLASTY;  Surgeon: Paralee Cancel, MD;  Location: [facility];  Service: Orthopedics;  Laterality: Left;  70 mins   UPPER GASTROINTESTINAL ENDOSCOPY      Social History:  reports that she quit smoking about 32 years ago. Her smoking use included cigarettes. She has a 30.00 pack-year smoking history. She has never used smokeless tobacco. She reports that she does not currently use alcohol. She reports that she does not use drugs.  Allergies: Allergies  Allergen Reactions   Adhesive [Tape]     Redness and skin peeling   Aspirin     Intestinal Bleeding   Ciprofloxacin Nausea And Vomiting   Codeine Nausea And Vomiting   Demerol [Meperidine Hcl] Nausea And Vomiting   Lactose Intolerance (Gi) Other (See Comments)    Pt has Crohn's    Latex     Redness and skin peeling   Other     Nuts-itching in throat  Seeds-stomach issues with chrons     Septra [Sulfamethoxazole-Trimethoprim] Nausea Only    Family History:  Family History  Problem Relation Age of Onset   Diabetes Father    Parkinson's disease Father    Colon cancer Other    Asthma Neg Hx    Esophageal cancer Neg Hx    Pancreatic cancer Neg Hx    Liver disease Neg Hx    Stomach cancer Neg Hx      Current Outpatient Medications:    Calcium Citrate-Vitamin D (CALCIUM + D PO), Take 1 tablet by mouth 2 (two) times daily., Disp: , Rfl:    cetirizine (ZYRTEC) 10 MG tablet, Take 1 tablet (10 mg total) by mouth daily., Disp: 90 tablet, Rfl: 1   colestipol (COLESTID) 1 g tablet, Take 2 tablets (2 g total) by mouth 2 (two) times daily., Disp: 120 tablet, Rfl: 3   dicyclomine (BENTYL) 20 MG tablet, Take 1 tablet (20 mg total) by mouth 4 (four) times daily as needed for spasms., Disp: 360 tablet, Rfl: 3   DULoxetine (CYMBALTA)  60 MG capsule, Take 1 capsule (60 mg total) by mouth 2 (two) times daily., Disp: 180 capsule, Rfl: 1   fluticasone (FLONASE) 50 MCG/ACT nasal spray, Place 2 sprays into both nostrils daily., Disp: 48 mL, Rfl: 1   loperamide (IMODIUM) 2 MG capsule, Take 1 capsule (2 mg total) by mouth 3 (three) times daily as needed for diarrhea or loose stools., Disp: 180 capsule, Rfl: 6   losartan (COZAAR) 100 MG tablet, Take 1 tablet (100 mg total) by mouth daily., Disp: 90 tablet, Rfl: 1   mesalamine (LIALDA) 1.2 g EC tablet, Take 4 tablets (4.8 g total) by mouth daily with breakfast., Disp: 360 tablet, Rfl: 3   Misc Natural Product Nasal (NASAL CLEANSE RINSE MIX NA), Place 1 Dose into the nose daily as needed (congestion)., Disp: , Rfl:    Multiple Vitamin (MULTIVITAMIN WITH MINERALS) TABS tablet, Take 1 tablet by mouth daily., Disp: , Rfl:    omeprazole (PRILOSEC) 40 MG capsule, Take 1 capsule (40 mg total) by mouth in the morning, at noon, and at bedtime., Disp: 90 capsule, Rfl: 3   triamcinolone cream (KENALOG) 0.1 %, APPLY TO AFFECTED AREA TWICE A DAY, Disp: 90 g, Rfl: 2  Review of Systems:  Constitutional: Denies fever, chills, diaphoresis, appetite change and fatigue.  HEENT: Denies photophobia, eye pain, redness, hearing loss, ear pain, congestion, sore throat, rhinorrhea, sneezing, mouth sores, trouble swallowing, neck pain, neck stiffness and tinnitus.   Respiratory: Denies SOB, DOE, cough, chest tightness,  and wheezing.   Cardiovascular: Denies chest pain, palpitations and leg swelling.  Gastrointestinal: Denies nausea, vomiting, abdominal pain, diarrhea, constipation, blood in stool and abdominal distention.  Genitourinary: Denies dysuria, urgency, frequency, hematuria, flank pain and difficulty urinating.  Endocrine: Denies: hot or cold intolerance, sweats, changes in hair or nails, polyuria, polydipsia. Musculoskeletal: Denies myalgias, back pain, joint swelling, arthralgias and gait problem.   Skin: Denies pallor, rash. Neurological: Denies dizziness, seizures, syncope, weakness, light-headedness, numbness and headaches.  Hematological: Denies adenopathy. Easy bruising, personal or family bleeding history  Psychiatric/Behavioral: Denies suicidal ideation, mood changes, confusion, nervousness, sleep disturbance and agitation    Physical Exam: Vitals:   10/13/20 1126  BP: 120/78  Temp: (!) 97.4 F (36.3 C)  TempSrc: Oral  Weight: 121 lb 14.4 oz (55.3 kg)    Body mass index is 23.81 kg/m.   Constitutional: NAD, calm, comfortable Eyes: PERRL, lids and conjunctivae normal ENMT: Mucous membranes are moist.  Skin: About a 2 cm circumferential wound on the posterior aspect of her right calf.  Only superior layer of epidermis seems to have been removed. Psychiatric: Normal judgment and insight. Alert and oriented x 3. Normal mood.    Impression and Plan:  Wound of right lower extremity, initial encounter -This can be managed conservatively with Vaseline infused dressing changes, she knows to contact me if purulent drainage or significant erythema develops. -Wound has been dressed with petroleum infused gauze.  Time spent: 20 minutes reviewing chart, interviewing and examining patient, performing wound care.    Lelon Frohlich, MD [facility]

## 2020-10-18 ENCOUNTER — Telehealth: Payer: Self-pay | Admitting: Internal Medicine

## 2020-10-18 ENCOUNTER — Telehealth: Payer: Self-pay

## 2020-10-18 NOTE — Chronic Care Management (AMB) (Signed)
  Chronic Care Management   Note  10/18/2020 Name: LAVRA IMLER MRN: [id number] DOB: May 12, 1945  Norva Riffle is a 75 y.o. year old female who is a primary care patient of Isaac Bliss, Rayford Halsted, MD. I reached out to Norva Riffle by phone today in response to a referral sent by Ms. Marae I Dinger's PCP, Isaac Bliss, Rayford Halsted, MD.   Ms. Salih was given information about Chronic Care Management services today including:  CCM service includes personalized support from designated clinical staff supervised by her physician, including individualized plan of care and coordination with other care providers 24/7 contact phone numbers for assistance for urgent and routine care needs. Service will only be billed when office clinical staff spend 20 minutes or more in a month to coordinate care. Only one practitioner may furnish and bill the service in a calendar month. The patient may stop CCM services at any time (effective at the end of the month) by phone call to the office staff.   Patient agreed to services and verbal consent obtained.   Follow up plan:   Tatjana Secretary/administrator

## 2020-10-18 NOTE — Telephone Encounter (Signed)
Patient called stating that her leg has gotten much worst since her last office visit and she wanted to know what she should do about it I informed patient that PCP is out of the office until next week and she could schedule an appt with another provider patient then stated that she is out of town I told patient she should seek treatment at an urgent care or ED, patient then stated that she would call back next week when she has her appt book handy to schedule an appt.

## 2020-10-25 ENCOUNTER — Other Ambulatory Visit: Payer: Self-pay

## 2020-10-26 ENCOUNTER — Ambulatory Visit (INDEPENDENT_AMBULATORY_CARE_PROVIDER_SITE_OTHER): Payer: Medicare Other | Admitting: Internal Medicine

## 2020-10-26 ENCOUNTER — Encounter: Payer: Self-pay | Admitting: Internal Medicine

## 2020-10-26 VITALS — BP 130/90 | Temp 98.0°F | Wt 121.9 lb

## 2020-10-26 DIAGNOSIS — S81801D Unspecified open wound, right lower leg, subsequent encounter: Secondary | ICD-10-CM

## 2020-10-26 NOTE — Progress Notes (Signed)
Established Patient Office Visit     This visit occurred during the SARS-CoV-2 public health emergency.  Safety protocols were in place, including screening questions prior to the visit, additional usage of staff PPE, and extensive cleaning of exam room while observing appropriate contact time as indicated for disinfecting solutions.    CC/Reason for Visit: Follow-up right lower extremity wound  HPI: [name] is a 75 y.o. female who is coming in today for the above mentioned reasons.  She was seen on September 7 for a right calf wound.  She comes in today because she states it is not improving.  At that time it was a skin denudation wound after a burst blister.  We advised Vaseline covered gauze dressing which she has been doing.  She has not had any fever, there is no surrounding erythema.  Past Medical/Surgical History: Past Medical History:  Diagnosis Date   Allergies    Bruises easily    Complication of anesthesia    Crohn's disease ([place])    GI Dr Beulah Gandy in [place], [place]    Deviated septum    GERD (gastroesophageal reflux disease)    Headache    occ    Hiatal hernia    HTN (hypertension)    pcp Dr Margaretann Loveless  in [place], [place]    Kidney disease, chronic, stage III (GFR 30-59 ml/min) ([place])    Neuropathy    fingers,periodically    Osteoarthritis    Pancreatic insufficiency    PONV (postoperative nausea and vomiting)    Rheumatoid arthritis ([place])    Scoliosis    Squamous cell carcinoma of arm    left    Whooping cough 2015   Whooping cough with pneumonia    2017    Past Surgical History:  Procedure Laterality Date   CATARACT EXTRACTION, BILATERAL  2018   with IOC implant    COLONOSCOPY     DEBRIDEMENT AND CLOSURE WOUND Right 10/01/2018   Procedure: Excision of right knee wound with placement of ACell and Pravena;  Surgeon: Wallace Going, DO;  Location: [facility];  Service: Plastics;  Laterality: Right;  30 min    JOINT REPLACEMENT  2020   right knee   knee fracture      NASAL FRACTURE SURGERY     needed b/c she was getting frequent sinus infections    TOTAL KNEE ARTHROPLASTY Right 07/28/2018   Procedure: TOTAL KNEE ARTHROPLASTY;  Surgeon: Paralee Cancel, MD;  Location: [facility];  Service: Orthopedics;  Laterality: Right;   TOTAL KNEE ARTHROPLASTY Left 03/02/2019   Procedure: TOTAL KNEE ARTHROPLASTY;  Surgeon: Paralee Cancel, MD;  Location: [facility];  Service: Orthopedics;  Laterality: Left;  70 mins   UPPER GASTROINTESTINAL ENDOSCOPY      Social History:  reports that she quit smoking about 32 years ago. Her smoking use included cigarettes. She has a 30.00 pack-year smoking history. She has never used smokeless tobacco. She reports that she does not currently use alcohol. She reports that she does not use drugs.  Allergies: Allergies  Allergen Reactions   Adhesive [Tape]     Redness and skin peeling   Aspirin     Intestinal Bleeding   Ciprofloxacin Nausea And Vomiting   Codeine Nausea And Vomiting   Demerol [Meperidine Hcl] Nausea And Vomiting   Lactose Intolerance (Gi) Other (See Comments)    Pt has Crohn's    Latex     Redness and skin peeling  Other     Nuts-itching in throat  Seeds-stomach issues with chrons     Septra [Sulfamethoxazole-Trimethoprim] Nausea Only    Family History:  Family History  Problem Relation Age of Onset   Diabetes Father    Parkinson's disease Father    Colon cancer Other    Asthma Neg Hx    Esophageal cancer Neg Hx    Pancreatic cancer Neg Hx    Liver disease Neg Hx    Stomach cancer Neg Hx      Current Outpatient Medications:    Calcium Citrate-Vitamin D (CALCIUM + D PO), Take 1 tablet by mouth 2 (two) times daily., Disp: , Rfl:    cetirizine (ZYRTEC) 10 MG tablet, Take 1 tablet (10 mg total) by mouth daily., Disp: 90 tablet, Rfl: 1   colestipol (COLESTID) 1 g tablet, Take 2 tablets (2 g total) by mouth 2 (two) times daily., Disp: 120 tablet, Rfl:  3   dicyclomine (BENTYL) 20 MG tablet, Take 1 tablet (20 mg total) by mouth 4 (four) times daily as needed for spasms., Disp: 360 tablet, Rfl: 3   DULoxetine (CYMBALTA) 60 MG capsule, Take 1 capsule (60 mg total) by mouth 2 (two) times daily., Disp: 180 capsule, Rfl: 1   fluticasone (FLONASE) 50 MCG/ACT nasal spray, Place 2 sprays into both nostrils daily., Disp: 48 mL, Rfl: 1   loperamide (IMODIUM) 2 MG capsule, Take 1 capsule (2 mg total) by mouth 3 (three) times daily as needed for diarrhea or loose stools., Disp: 180 capsule, Rfl: 6   losartan (COZAAR) 100 MG tablet, Take 1 tablet (100 mg total) by mouth daily., Disp: 90 tablet, Rfl: 1   mesalamine (LIALDA) 1.2 g EC tablet, Take 4 tablets (4.8 g total) by mouth daily with breakfast., Disp: 360 tablet, Rfl: 3   Misc Natural Product Nasal (NASAL CLEANSE RINSE MIX NA), Place 1 Dose into the nose daily as needed (congestion)., Disp: , Rfl:    Multiple Vitamin (MULTIVITAMIN WITH MINERALS) TABS tablet, Take 1 tablet by mouth daily., Disp: , Rfl:    omeprazole (PRILOSEC) 40 MG capsule, Take 1 capsule (40 mg total) by mouth in the morning, at noon, and at bedtime., Disp: 90 capsule, Rfl: 3   triamcinolone cream (KENALOG) 0.1 %, APPLY TO AFFECTED AREA TWICE A DAY, Disp: 90 g, Rfl: 2  Review of Systems:  Constitutional: Denies fever, chills, diaphoresis, appetite change and fatigue.  HEENT: Denies photophobia, eye pain, redness, hearing loss, ear pain, congestion, sore throat, rhinorrhea, sneezing, mouth sores, trouble swallowing, neck pain, neck stiffness and tinnitus.   Respiratory: Denies SOB, DOE, cough, chest tightness,  and wheezing.   Cardiovascular: Denies chest pain, palpitations and leg swelling.  Gastrointestinal: Denies nausea, vomiting, abdominal pain, diarrhea, constipation, blood in stool and abdominal distention.  Genitourinary: Denies dysuria, urgency, frequency, hematuria, flank pain and difficulty urinating.  Endocrine: Denies: hot or  cold intolerance, sweats, changes in hair or nails, polyuria, polydipsia. Musculoskeletal: Denies myalgias, back pain, joint swelling, arthralgias and gait problem.  Skin: Denies pallor, rash. Neurological: Denies dizziness, seizures, syncope, weakness, light-headedness, numbness and headaches.  Hematological: Denies adenopathy. Easy bruising, personal or family bleeding history  Psychiatric/Behavioral: Denies suicidal ideation, mood changes, confusion, nervousness, sleep disturbance and agitation    Physical Exam: Vitals:   10/26/20 1352  BP: 130/90  Temp: 98 F (36.7 C)  TempSrc: Oral  Weight: 121 lb 14.4 oz (55.3 kg)    Body mass index is 23.81 kg/m.   Constitutional: NAD, calm,  comfortable Eyes: PERRL, lids and conjunctivae normal, wears corrective lenses ENMT: Mucous membranes are moist.  Skin: About a silver dollar sized area of skin denudation on the right lower calf with apparent areas of granulation. Psychiatric: Normal judgment and insight. Alert and oriented x 3. Normal mood.    Impression and Plan:  Wound of right lower extremity, subsequent encounter  - Plan: Ambulatory referral to [facility] actually looks improved to me today, however per patient request I will refer to wound clinic. -Wound has been dressed with petroleum gauze.     Lelon Frohlich, MD [facility]

## 2020-11-03 ENCOUNTER — Telehealth: Payer: Self-pay | Admitting: Internal Medicine

## 2020-11-03 ENCOUNTER — Other Ambulatory Visit: Payer: Self-pay | Admitting: Internal Medicine

## 2020-11-03 MED ORDER — COLESTIPOL HCL 1 G PO TABS: 2.0000 g | ORAL_TABLET | Freq: Two times a day (BID) | ORAL | 0 refills | Status: DC

## 2020-11-03 NOTE — Telephone Encounter (Signed)
Patient seen 09-2020. Scheduled for colonoscopy on 11-17

## 2020-11-03 NOTE — Telephone Encounter (Signed)
Patient called requesting a refill on Colestipol

## 2020-11-15 ENCOUNTER — Other Ambulatory Visit: Payer: Self-pay

## 2020-11-15 ENCOUNTER — Other Ambulatory Visit (HOSPITAL_BASED_OUTPATIENT_CLINIC_OR_DEPARTMENT_OTHER): Payer: Self-pay | Admitting: Internal Medicine

## 2020-11-15 ENCOUNTER — Encounter (HOSPITAL_BASED_OUTPATIENT_CLINIC_OR_DEPARTMENT_OTHER): Payer: Medicare Other | Attending: Internal Medicine | Admitting: Internal Medicine

## 2020-11-15 DIAGNOSIS — I87301 Chronic venous hypertension (idiopathic) without complications of right lower extremity: Secondary | ICD-10-CM | POA: Diagnosis not present

## 2020-11-15 DIAGNOSIS — K50919 Crohn's disease, unspecified, with unspecified complications: Secondary | ICD-10-CM | POA: Diagnosis not present

## 2020-11-15 DIAGNOSIS — L97211 Non-pressure chronic ulcer of right calf limited to breakdown of skin: Secondary | ICD-10-CM | POA: Insufficient documentation

## 2020-11-16 NOTE — Progress Notes (Signed)
[name], April ([id number]) Visit Report for 11/15/2020 Biopsy Details Patient Name: Date of Service: [name], [name] 11/15/2020 1:15 PM Medical Record Number: [id number] Patient Account Number: [id number] Date of Birth/Sex: Treating RN: 12/03/1945 (75 y.o. Female) Levan Hurst Primary Care Provider: Domingo Mend Other Clinician: Referring Provider: Treating Provider/Extender: Cheree Ditto in Treatment: 0 Biopsy Performed for: Wound #1 Right, Posterior Lower Leg Location(s): Wound Bed Performed By: Physician Ricard Dillon., MD Tissue Punch: No Number of Specimens T aken: 1 Specimen Sent T Pathology: o Yes Level of Consciousness (Pre-procedure): Awake and Alert Pre-procedure Verification/Time-Out Taken: Yes - 14:25 Pain Control: Lidocaine Injectable Lidocaine Percent: 1% Instrument: Blade, Forceps Bleeding: Moderate Hemostasis Achieved: Silver Nitrate Procedural Pain: 0 Post Procedural Pain: 0 Response to Treatment: Procedure was tolerated well Level of Consciousness (Post-procedure): Awake and Alert Post Procedure Diagnosis Same as Pre-procedure Electronic Signature(s) Signed: 11/15/2020 4:30:00 PM By: [name] MD Entered By: [name] on 11/15/2020 15:48:41 -------------------------------------------------------------------------------- Chief Complaint Document Details Patient Name: Date of Service: April Austin I. 11/15/2020 1:15 PM Medical Record Number: [id number] Patient Account Number: [id number] Date of Birth/Sex: Treating RN: 30-Sep-1945 (75 y.o. Female) Levan Hurst Primary Care Provider: Domingo Mend Other Clinician: Referring Provider: Treating Provider/Extender: Cheree Ditto in Treatment: 0 Information Obtained from: Patient Chief Complaint 11/15/2020; patient is here for review of a wound area on the right posterior calf Electronic Signature(s) Signed: 11/15/2020 4:30:00 PM By: [name] MD Entered  By: [name] on 11/15/2020 15:50:18 -------------------------------------------------------------------------------- Debridement Details Patient Name: Date of Service: April Austin I. 11/15/2020 1:15 PM Medical Record Number: [id number] Patient Account Number: [id number] Date of Birth/Sex: Treating RN: [birth year] (75 y.o. Female) Levan Hurst Primary Care Provider: Domingo Mend Other Clinician: Referring Provider: Treating Provider/Extender: Cheree Ditto in Treatment: 0 Debridement Performed for Assessment: Wound #1 Right,Posterior Lower Leg Performed By: Clinician Levan Hurst, RN Debridement Type: Chemical/Enzymatic/Mechanical Agent Used: Anasept and gauze to remove biofilm Severity of Tissue Pre Debridement: Fat layer exposed Level of Consciousness (Pre-procedure): Awake and Alert Pre-procedure Verification/Time Out Yes - 14:25 Taken: Start Time: 14:25 Bleeding: None Procedural Pain: 0 Post Procedural Pain: 0 Response to Treatment: Procedure was tolerated well Level of Consciousness (Post- Awake and Alert procedure): Post Debridement Measurements of Total Wound Length: (cm) 5 Width: (cm) 4.3 Depth: (cm) 0.1 Volume: (cm) 1.689 Character of Wound/Ulcer Post Debridement: Improved Severity of Tissue Post Debridement: Fat layer exposed Post Procedure Diagnosis Same as Pre-procedure Electronic Signature(s) Signed: 11/15/2020 4:30:00 PM By: [name] MD Signed: 11/16/2020 5:34:00 PM By: Levan Hurst RN, BSN Entered By: [name] on 11/15/2020 15:48:55 -------------------------------------------------------------------------------- HPI Details Patient Name: Date of Service: April Austin I. 11/15/2020 1:15 PM Medical Record Number: [id number] Patient Account Number: [id number] Date of Birth/Sex: Treating RN: 1945-04-07 (75 y.o. Female) Levan Hurst Primary Care Provider: Domingo Mend Other Clinician: Referring  Provider: Treating Provider/Extender: Cheree Ditto in Treatment: 0 History of Present Illness HPI Description: ADMISSION 11/15/2020 This is a 75 year old woman with a history of squamous cell cancer skin cancer followed by Dr. Anabel Bene dermatology. She describes having a wound on the right posterior calf dating back since September. She saw her primary doctor in early October who suggested petroleum infused gauze. The patient has been doing this. No real improvement in fact she says this is getting larger. She does not complain much of pain. She is completely uncertain about how this happened there was no trauma she simply became  aware of it at some point describing it is about the size of a quarter but it has been getting progressively larger since then. She is here for review of this. Past medical history Crohn's disease not currently on prednisone, gastroesophageal reflux disease, history of squamous cell skin cancer, history of rheumatoid arthritis, hypertension Electronic Signature(s) Signed: 11/15/2020 4:30:00 PM By: [name] MD Entered By: [name] on 11/15/2020 15:52:21 -------------------------------------------------------------------------------- Physical Exam Details Patient Name: Date of Service: April Austin I. 11/15/2020 1:15 PM Medical Record Number: [id number] Patient Account Number: [id number] Date of Birth/Sex: Treating RN: [birth year] (75 y.o. Female) Levan Hurst Primary Care Provider: Domingo Mend Other Clinician: Referring Provider: Treating Provider/Extender: Cheree Ditto in Treatment: 0 Constitutional Sitting or standing Blood Pressure is within target range for patient.. Pulse regular and within target range for patient.Marland Kitchen Respirations regular, non-labored and within target range.. Temperature is normal and within the target range for the patient.Marland Kitchen Appears in no distress. Cardiovascular Pedal pulses are palpable on the  right. Probably some degree of venous hypertension but certainly not that dramatic dilated venules medially in the ankles etc. No major edema. Notes Wound exam; superficial but irregular wound on the right posterior calf. Surrounding skin somewhat pale and scaly. There is no subcutaneous involvement to this no purulent drainage. She has what appears to be solar induced skin damage bilaterally in her lower legs. She showed me a foreign on her left dorsal forearm just above the wrist for which she has an appoint with her dermatologist in 2 weeks. Electronic Signature(s) Signed: 11/15/2020 4:30:00 PM By: [name] MD Entered By: [name] on 11/15/2020 15:56:26 -------------------------------------------------------------------------------- Physician Orders Details Patient Name: Date of Service: April Austin I. 11/15/2020 1:15 PM Medical Record Number: [id number] Patient Account Number: [id number] Date of Birth/Sex: Treating RN: 1945/08/30 (75 y.o. Female) Levan Hurst Primary Care Provider: Domingo Mend Other Clinician: Referring Provider: Treating Provider/Extender: Cheree Ditto in Treatment: 0 Verbal / Phone Orders: No Diagnosis Coding Follow-up Appointments ppointment in 1 week. - Dr. Dellia Nims Return A Bathing/ Shower/ Hygiene May shower and wash wound with soap and water. - prior to dressing change Wound Treatment Wound #1 - Lower Leg Wound Laterality: Right, Posterior Cleanser: Soap and Water 1 x Per AUQ/33 Days Discharge Instructions: May shower and wash wound with dial antibacterial soap and water prior to dressing change. Cleanser: Byram Ancillary Kit - 15 Day Supply (DME) (Generic) 1 x Per Day/15 Days Discharge Instructions: Use supplies as instructed; Kit contains: (15) Saline Bullets; (15) 3x3 Gauze; 15 pr Gloves Prim Dressing: KerraCel Ag Gelling Fiber Dressing, 4x5 in (silver alginate) (DME) (Generic) 1 x Per Day/15 Days ary Discharge  Instructions: Apply silver alginate to wound bed as instructed Secondary Dressing: Woven Gauze Sponge, Non-Sterile 4x4 in (DME) (Generic) 1 x Per Day/15 Days Discharge Instructions: Apply over primary dressing as directed. Secured With: Paper Tape, 2x10 (in/yd) (DME) (Generic) 1 x Per Day/15 Days Discharge Instructions: Secure dressing with tape as directed. Secured With: Scientist, forensic, Sterile 4x75 (in/in) (DME) (Generic) 1 x Per Day/15 Days Discharge Instructions: Secure with stretch gauze as directed. Laboratory Bacteria identified in Tissue by Biopsy culture (MICRO) - right posterior lower leg LOINC Code: [id number] Convenience Name: Biopsy specimen culture Electronic Signature(s) Signed: 11/15/2020 4:30:00 PM By: [name] MD Signed: 11/16/2020 5:34:00 PM By: Levan Hurst RN, BSN Entered By: Levan Hurst on 11/15/2020 14:37:52 -------------------------------------------------------------------------------- Problem List Details Patient Name: Date of Service: Aida Raider,  Jacqueline I. 11/15/2020 1:15 PM Medical Record Number: [id number] Patient Account Number: [id number] Date of Birth/Sex: Treating RN: 04-25-1945 (75 y.o. Female) Levan Hurst Primary Care Provider: Domingo Mend Other Clinician: Referring Provider: Treating Provider/Extender: Cheree Ditto in Treatment: 0 Active Problems ICD-10 Encounter Code Description Active Date MDM Diagnosis L97.211 Non-pressure chronic ulcer of right calf limited to breakdown of skin 11/15/2020 No Yes I87.301 Chronic venous hypertension (idiopathic) without complications of right lower 11/15/2020 No Yes extremity K50.919 Crohn's disease, unspecified, with unspecified complications 12/13/5168 No Yes Inactive Problems Resolved Problems Electronic Signature(s) Signed: 11/15/2020 4:30:00 PM By: [name] MD Entered By: [name] on 11/15/2020  14:52:13 -------------------------------------------------------------------------------- Progress Note Details Patient Name: Date of Service: April Austin I. 11/15/2020 1:15 PM Medical Record Number: [id number] Patient Account Number: [id number] Date of Birth/Sex: Treating RN: 1945-08-11 (75 y.o. Female) Levan Hurst Primary Care Provider: Domingo Mend Other Clinician: Referring Provider: Treating Provider/Extender: Cheree Ditto in Treatment: 0 Subjective Chief Complaint Information obtained from Patient 11/15/2020; patient is here for review of a wound area on the right posterior calf History of Present Illness (HPI) ADMISSION 11/15/2020 This is a 74 year old woman with a history of squamous cell cancer skin cancer followed by Dr. Anabel Bene dermatology. She describes having a wound on the right posterior calf dating back since September. She saw her primary doctor in early October who suggested petroleum infused gauze. The patient has been doing this. No real improvement in fact she says this is getting larger. She does not complain much of pain. She is completely uncertain about how this happened there was no trauma she simply became aware of it at some point describing it is about the size of a quarter but it has been getting progressively larger since then. She is here for review of this. Past medical history Crohn's disease not currently on prednisone, gastroesophageal reflux disease, history of squamous cell skin cancer, history of rheumatoid arthritis, hypertension Patient History Information obtained from Patient. Allergies adhesive tape (Severity: Mild, Reaction: Redness and skin peeling), aspirin (Severity: Moderate, Reaction: Intestinal Bleeding), ciprofloxacin (Severity: Moderate, Reaction: Nausea/Vomiting), codeine (Severity: Moderate, Reaction: N/V), Demerol (Severity: Severe, Reaction: N/V), Septra (Severity: Moderate, Reaction: Nausea), latex (Severity:  Severe, Reaction: Redness and skin peeling), lactose (Severity: Moderate, Reaction: Pt. has Chrons Disease) Social History Former smoker - quit 30 years ago, Alcohol Use - Never, Drug Use - No History, Caffeine Use - Rarely. Medical History Eyes Patient has history of Cataracts - Had cataract surgery Denies history of Glaucoma, Optic Neuritis Ear/Nose/Mouth/Throat Patient has history of Chronic sinus problems/congestion - Post Nasal drip Denies history of Middle ear problems Hematologic/Lymphatic Denies history of Anemia, Hemophilia, Human Immunodeficiency Virus, Lymphedema, Sickle Cell Disease Respiratory Denies history of Aspiration, Asthma, Chronic Obstructive Pulmonary Disease (COPD), Pneumothorax, Sleep Apnea, Tuberculosis Gastrointestinal Patient has history of Crohnoos - Has Chrons Disease Denies history of Cirrhosis , Colitis, Hepatitis A, Hepatitis B, Hepatitis C Endocrine Denies history of Type I Diabetes, Type II Diabetes Genitourinary Denies history of End Stage Renal Disease Immunological Denies history of Lupus Erythematosus, Raynaudoos, Scleroderma Musculoskeletal Patient has history of Osteoarthritis - Knees Denies history of Gout, Rheumatoid Arthritis, Osteomyelitis Neurologic Denies history of Dementia, Neuropathy, Quadriplegia, Paraplegia, Seizure Disorder Oncologic Denies history of Received Chemotherapy, Received Radiation Psychiatric Denies history of Anorexia/bulimia, Confinement Anxiety Medical A Surgical History Notes nd Gastrointestinal Has a large hiatal hernia. Has Chrons Disease. Endocrine Pancreatic Insufficiency Genitourinary stage 3 Chronic Kidney disease Integumentary (Skin) Had Squamous Cell (skin cancer). Had  a skin graft on Right knee (area) Musculoskeletal Had Bil knee replacements Oncologic Had Squamous Cell (skin cell) Review of Systems (ROS) Constitutional Symptoms (General Health) Denies complaints or symptoms of Fatigue,  Fever, Chills, Marked Weight Change. Eyes Complains or has symptoms of Dry Eyes - Dry eyes from time to time, Glasses / Contacts - Wears glasses. Denies complaints or symptoms of Vision Changes. Ear/Nose/Mouth/Throat Denies complaints or symptoms of Chronic sinus problems or rhinitis, Wears hearing aids in both ears Respiratory Denies complaints or symptoms of Chronic or frequent coughs, Shortness of Breath. Cardiovascular Denies complaints or symptoms of Chest pain. Gastrointestinal Denies complaints or symptoms of Frequent diarrhea, Nausea, Vomiting. Endocrine Denies complaints or symptoms of Heat/cold intolerance. Genitourinary Denies complaints or symptoms of Frequent urination. Integumentary (Skin) Complains or has symptoms of Wounds - Currently has a wound on R Post LE. Musculoskeletal Denies complaints or symptoms of Muscle Pain, Muscle Weakness. Neurologic Complains or has symptoms of Numbness/parasthesias - Numbness in fingertips. Psychiatric Denies complaints or symptoms of Claustrophobia, Suicidal. Objective Constitutional Sitting or standing Blood Pressure is within target range for patient.. Pulse regular and within target range for patient.Marland Kitchen Respirations regular, non-labored and within target range.. Temperature is normal and within the target range for the patient.Marland Kitchen Appears in no distress. Vitals Time Taken: 1:15 PM, Height: 60 in, Weight: 117 lbs, BMI: 22.8, Temperature: 97.4 F, Pulse: 69 bpm, Respiratory Rate: 16 breaths/min, Blood Pressure: 148/82 mmHg. Cardiovascular Pedal pulses are palpable on the right. Probably some degree of venous hypertension but certainly not that dramatic dilated venules medially in the ankles etc. No major edema. General Notes: Wound exam; superficial but irregular wound on the right posterior calf. Surrounding skin somewhat pale and scaly. There is no subcutaneous involvement to this no purulent drainage. She has what appears to be  solar induced skin damage bilaterally in her lower legs. She showed me a foreign on her left dorsal forearm just above the wrist for which she has an appoint with her dermatologist in 2 weeks. Integumentary (Hair, Skin) Wound #1 status is Open. Original cause of wound was Gradually Appeared. The date acquired was: 09/14/2020. The wound is located on the Right,Posterior Lower Leg. The wound measures 5cm length x 4.3cm width x 0.1cm depth; 16.886cm^2 area and 1.689cm^3 volume. There is no tunneling or undermining noted. There is a medium amount of serosanguineous drainage noted. The wound margin is distinct with the outline attached to the wound base. There is large (67-100%) red, pink granulation within the wound bed. There is a small (1-33%) amount of necrotic tissue within the wound bed including Adherent Slough. Assessment Active Problems ICD-10 Non-pressure chronic ulcer of right calf limited to breakdown of skin Chronic venous hypertension (idiopathic) without complications of right lower extremity Crohn's disease, unspecified, with unspecified complications Procedures Wound #1 Pre-procedure diagnosis of Wound #1 is a Venous Leg Ulcer located on the Right,Posterior Lower Leg .Severity of Tissue Pre Debridement is: Fat layer exposed. There was a Chemical/Enzymatic/Mechanical debridement performed by Levan Hurst, RN.. Other agent used was Anasept and gauze to remove biofilm. A time out was conducted at 14:25, prior to the start of the procedure. There was no bleeding. The procedure was tolerated well with a pain level of 0 throughout and a pain level of 0 following the procedure. Post Debridement Measurements: 5cm length x 4.3cm width x 0.1cm depth; 1.689cm^3 volume. Character of Wound/Ulcer Post Debridement is improved. Severity of Tissue Post Debridement is: Fat layer exposed. Post procedure Diagnosis Wound #1: Same  as Pre-Procedure Pre-procedure diagnosis of Wound #1 is a Venous Leg Ulcer  located on the Right, Posterior Lower Leg . There was a biopsy performed by Ricard Dillon., MD. There was a biopsy performed on Wound Bed. The skin was cleansed and prepped with anti-septic followed by pain control using Lidocaine Injectable: 1%. Tissue was removed at its base with the following instrument(s): Blade and Forceps and sent to pathology. A Moderate amount of bleeding was controlled with Silver Nitrate. A time out was conducted at 14:25, prior to the start of the procedure. The procedure was tolerated well with a pain level of 0 throughout and a pain level of 0 following the procedure. Post procedure Diagnosis Wound #1: Same as Pre-Procedure Plan Follow-up Appointments: Return Appointment in 1 week. - Dr. Dellia Nims Bathing/ Shower/ Hygiene: May shower and wash wound with soap and water. - prior to dressing change Laboratory ordered were: Biopsy specimen culture - right posterior lower leg WOUND #1: - Lower Leg Wound Laterality: Right, Posterior Cleanser: Soap and Water 1 x Per Day/15 Days Discharge Instructions: May shower and wash wound with dial antibacterial soap and water prior to dressing change. Cleanser: Byram Ancillary Kit - 15 Day Supply (DME) (Generic) 1 x Per Day/15 Days Discharge Instructions: Use supplies as instructed; Kit contains: (15) Saline Bullets; (15) 3x3 Gauze; 15 pr Gloves Prim Dressing: KerraCel Ag Gelling Fiber Dressing, 4x5 in (silver alginate) (DME) (Generic) 1 x Per Day/15 Days ary Discharge Instructions: Apply silver alginate to wound bed as instructed Secondary Dressing: Woven Gauze Sponge, Non-Sterile 4x4 in (DME) (Generic) 1 x Per Day/15 Days Discharge Instructions: Apply over primary dressing as directed. Secured With: Paper Tape, 2x10 (in/yd) (DME) (Generic) 1 x Per Day/15 Days Discharge Instructions: Secure dressing with tape as directed. Secured With: Scientist, forensic, Sterile 4x75 (in/in) (DME) (Generic) 1 x Per Day/15  Days Discharge Instructions: Secure with stretch gauze as directed. #1 somewhat suspicious area on the right posterior calf. Because a history of skin cancer and the irregular appearance of this I did a shave biopsy after anesthetizing an area with injectable lidocaine 2. Fortuitously the patient has a appointment with her dermatologist in 2 weeks Dr. Anabel Bene however it will take a few days before this biopsy returns. 3. I am going to use silver alginate on the wound, gauze kerlix. 4. Patient's skin is very fragile and I suppose it is possible that this could could have happened with minor trauma but then again the reason why this is expanding in size is unclear. I did not see any evidence of infection Electronic Signature(s) Signed: 11/15/2020 4:30:00 PM By: [name] MD Entered By: [name] on 11/15/2020 15:58:51 -------------------------------------------------------------------------------- HxROS Details Patient Name: Date of Service: April Austin I. 11/15/2020 1:15 PM Medical Record Number: [id number] Patient Account Number: [id number] Date of Birth/Sex: Treating RN: 29-Jun-1945 (74 y.o. Female) Levan Hurst Primary Care Provider: Domingo Mend Other Clinician: Referring Provider: Treating Provider/Extender: Cheree Ditto in Treatment: 0 Information Obtained From Patient Constitutional Symptoms (General Health) Complaints and Symptoms: Negative for: Fatigue; Fever; Chills; Marked Weight Change Eyes Complaints and Symptoms: Positive for: Dry Eyes - Dry eyes from time to time; Glasses / Contacts - Wears glasses Negative for: Vision Changes Medical History: Positive for: Cataracts - Had cataract surgery Negative for: Glaucoma; Optic Neuritis Ear/Nose/Mouth/Throat Complaints and Symptoms: Negative for: Chronic sinus problems or rhinitis Review of System Notes: Wears hearing aids in both ears Medical History: Positive for: Chronic sinus  problems/congestion - Post Nasal drip Negative for: Middle ear problems Respiratory Complaints and Symptoms: Negative for: Chronic or frequent coughs; Shortness of Breath Medical History: Negative for: Aspiration; Asthma; Chronic Obstructive Pulmonary Disease (COPD); Pneumothorax; Sleep Apnea; Tuberculosis Cardiovascular Complaints and Symptoms: Negative for: Chest pain Gastrointestinal Complaints and Symptoms: Negative for: Frequent diarrhea; Nausea; Vomiting Medical History: Positive for: Crohns - Has Chrons Disease Negative for: Cirrhosis ; Colitis; Hepatitis A; Hepatitis B; Hepatitis C Past Medical History Notes: Has a large hiatal hernia. Has Chrons Disease. Endocrine Complaints and Symptoms: Negative for: Heat/cold intolerance Medical History: Negative for: Type I Diabetes; Type II Diabetes Past Medical History Notes: Pancreatic Insufficiency Genitourinary Complaints and Symptoms: Negative for: Frequent urination Medical History: Negative for: End Stage Renal Disease Past Medical History Notes: stage 3 Chronic Kidney disease Integumentary (Skin) Complaints and Symptoms: Positive for: Wounds - Currently has a wound on R Post LE Medical History: Past Medical History Notes: Had Squamous Cell (skin cancer). Had a skin graft on Right knee (area) Musculoskeletal Complaints and Symptoms: Negative for: Muscle Pain; Muscle Weakness Medical History: Positive for: Osteoarthritis - Knees Negative for: Gout; Rheumatoid Arthritis; Osteomyelitis Past Medical History Notes: Had Bil knee replacements Neurologic Complaints and Symptoms: Positive for: Numbness/parasthesias - Numbness in fingertips Medical History: Negative for: Dementia; Neuropathy; Quadriplegia; Paraplegia; Seizure Disorder Psychiatric Complaints and Symptoms: Negative for: Claustrophobia; Suicidal Medical History: Negative for: Anorexia/bulimia; Confinement Anxiety Hematologic/Lymphatic Medical  History: Negative for: Anemia; Hemophilia; Human Immunodeficiency Virus; Lymphedema; Sickle Cell Disease Immunological Medical History: Negative for: Lupus Erythematosus; Raynauds; Scleroderma Oncologic Medical History: Negative for: Received Chemotherapy; Received Radiation Past Medical History Notes: Had Squamous Cell (skin cell) HBO Extended History Items Ear/Nose/Mouth/Throat: Eyes: Chronic sinus Cataracts problems/congestion Immunizations Pneumococcal Vaccine: Received Pneumococcal Vaccination: Yes Received Pneumococcal Vaccination On or After 60th Birthday: Yes Tetanus Vaccine: Last tetanus shot: 11/16/2011 Implantable Devices None Family and Social History Former smoker - quit 30 years ago; Alcohol Use: Never; Drug Use: No History; Caffeine Use: Rarely; Financial Concerns: No; Food, Clothing or Shelter Needs: No; Support System Lacking: No; Transportation Concerns: No Engineer, maintenance) Signed: 11/15/2020 4:30:00 PM By: [name] MD Signed: 11/15/2020 5:09:16 PM By: Dellie Catholic RN Signed: 11/16/2020 5:34:00 PM By: Levan Hurst RN, BSN Entered By: Dellie Catholic on 11/15/2020 13:52:34 -------------------------------------------------------------------------------- [place] Details Patient Name: Date of Service: April Austin I. 11/15/2020 Medical Record Number: [id number] Patient Account Number: [id number] Date of Birth/Sex: Treating RN: 1945-02-23 (74 y.o. 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 K50.919 Crohn's disease, unspecified, with unspecified complications Facility Procedures CPT4 Code:  [id number] Description: 99213 - WOUND CARE VISIT-LEV 3 EST PT Modifier: 25 Quantity: 1 CPT4 Code: [id number] Description: 01749 - DEBRIDE W/O ANES NON SELECT Modifier: Quantity: 1 CPT4 Code: [id number] Description: 11103-Tangential biopsy of skin each separate/additional lesion ICD-10 Diagnosis Description L97.211 Non-pressure chronic ulcer of right calf limited to breakdown of skin Modifier: Quantity: 1 Physician Procedures : CPT4 Code Description Modifier [id number] WC PHYS LEVEL 3 NEW PT 25 ICD-10 Diagnosis Description 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

## 2020-11-16 NOTE — Progress Notes (Signed)
April, [name] ([id number]) Visit Report for 11/15/2020 Abuse/Suicide Risk Screen Details Patient Name: Date of Service: [name], [name] 11/15/2020 1:15 PM Medical Record Number: [id number] Patient Account Number: [id number] Date of Birth/Sex: Treating RN: April 22, 1945 (75 y.o. Female) Levan Hurst Primary Care Kenya Kook: Domingo Mend Other Clinician: Referring Denine Brotz: Treating Shaquaya Wuellner/Extender: Cheree Ditto in Treatment: 0 Abuse/Suicide Risk Screen Items Answer ABUSE RISK SCREEN: Has anyone close to you tried to hurt or harm you recentlyo No Do you feel uncomfortable with anyone in your familyo No Has anyone forced you do things that you didnt want to doo No Electronic Signature(s) Signed: 11/15/2020 5:09:16 PM By: Dellie Catholic RN Signed: 11/16/2020 5:34:00 PM By: Levan Hurst RN, BSN Entered By: Dellie Catholic on 11/15/2020 13:53:12 -------------------------------------------------------------------------------- Activities of Daily Living Details Patient Name: Date of Service: [name] I. 11/15/2020 1:15 PM Medical Record Number: [id number] Patient Account Number: [id number] Date of Birth/Sex: Treating RN: 1945/07/13 (75 y.o. Female) Levan Hurst Primary Care Kole Hilyard: Domingo Mend Other Clinician: Referring Orilla Templeman: Treating Gracilyn Gunia/Extender: Cheree Ditto in Treatment: 0 Activities of Daily Living Items Answer Activities of Daily Living (Please select one for each item) Drive Automobile Completely Able T Medications ake Completely Able Use T elephone Completely Able Care for Appearance Completely Able Use T oilet Completely Able Bath / Shower Completely Able Dress Self Completely Able Feed Self Completely Able Walk Completely Able Get In / Out Bed Completely Able Housework Completely Able Prepare Meals Completely Able Handle Money Completely Able Shop for Self Completely Able Electronic Signature(s) Signed: 11/15/2020  5:09:16 PM By: Dellie Catholic RN Signed: 11/16/2020 5:34:00 PM By: Levan Hurst RN, BSN Entered By: Dellie Catholic on 11/15/2020 13:54:13 -------------------------------------------------------------------------------- Education Screening Details Patient Name: Date of Service: [name] I. 11/15/2020 1:15 PM Medical Record Number: [id number] Patient Account Number: [id number] Date of Birth/Sex: Treating RN: 03/22/1945 (75 y.o. Female) Levan Hurst Primary Care Mikele Sifuentes: Domingo Mend Other Clinician: Referring Orion Mole: Treating Saraih Lorton/Extender: Cheree Ditto in Treatment: 0 Learning Preferences/Education Level/Primary Language Learning Preference: Explanation, Demonstration, Video, Printed Material Highest Education Level: College or Above Preferred Language: English Cognitive Barrier Language Barrier: No Translator Needed: No Memory Deficit: No Emotional Barrier: No Cultural/Religious Beliefs Affecting Medical Care: No Physical Barrier Impaired Vision: No Impaired Hearing: No Decreased Hand dexterity: No Knowledge/Comprehension Knowledge Level: High Comprehension Level: High Ability to understand written instructions: High Motivation Anxiety Level: Calm Cooperation: Cooperative Education Importance: Acknowledges Need Interest in Health Problems: Asks Questions Perception: Coherent Willingness to Engage in Self-Management High Activities: Readiness to Engage in Self-Management High Activities: Electronic Signature(s) Signed: 11/15/2020 5:09:16 PM By: Dellie Catholic RN Signed: 11/16/2020 5:34:00 PM By: Levan Hurst RN, BSN Entered By: Dellie Catholic on 11/15/2020 13:56:20 -------------------------------------------------------------------------------- Fall Risk Assessment Details Patient Name: Date of Service: [name] I. 11/15/2020 1:15 PM Medical Record Number: [id number] Patient Account Number: [id number] Date of  Birth/Sex: Treating RN: [birth year] (75 y.o. Female) Levan Hurst Primary Care Nikolus Marczak: Domingo Mend Other Clinician: Referring Damean Poffenberger: Treating Ramil Edgington/Extender: Cheree Ditto in Treatment: 0 Fall Risk Assessment Items Have you had 2 or more falls in the last 12 monthso 0 No Have you had any fall that resulted in injury in the last 12 monthso 0 No FALLS RISK SCREEN History of falling - immediate or within 3 months 0 No Secondary diagnosis (Do you have 2 or more medical diagnoseso) 15 Yes Ambulatory aid None/bed rest/wheelchair/nurse 0 Yes Crutches/cane/walker 0 No Furniture 0 No Intravenous  therapy Access/Saline/Heparin Lock 0 No Gait/Transferring Normal/ bed rest/ wheelchair 0 Yes Weak (short steps with or without shuffle, stooped but able to lift head while walking, may seek 0 No support from furniture) Impaired (short steps with shuffle, may have difficulty arising from chair, head down, impaired 0 No balance) Mental Status Oriented to own ability 0 Yes Electronic Signature(s) Signed: 11/15/2020 5:09:16 PM By: Dellie Catholic RN Signed: 11/16/2020 5:34:00 PM By: Levan Hurst RN, BSN Entered By: Dellie Catholic on 11/15/2020 13:58:17 -------------------------------------------------------------------------------- Foot Assessment Details Patient Name: Date of Service: [name] I. 11/15/2020 1:15 PM Medical Record Number: [id number] Patient Account Number: [id number] Date of Birth/Sex: Treating RN: 06/05/1945 (75 y.o. Female) Levan Hurst Primary Care Smrithi Pigford: Domingo Mend Other Clinician: Referring Donyetta Ogletree: Treating Sasha Rogel/Extender: Cheree Ditto in Treatment: 0 Foot Assessment Items Site Locations + = Sensation present, - = Sensation absent, C = Callus, U = Ulcer R = Redness, W = Warmth, M = Maceration, PU = Pre-ulcerative lesion F = Fissure, S = Swelling, D = Dryness Assessment Right: Left: Other Deformity: No  No Prior Foot Ulcer: No No Prior Amputation: No No Charcot Joint: No No Ambulatory Status: Ambulatory Without Help Gait: Steady Electronic Signature(s) Signed: 11/15/2020 5:09:16 PM By: Dellie Catholic RN Signed: 11/16/2020 5:34:00 PM By: Levan Hurst RN, BSN Entered By: Dellie Catholic on 11/15/2020 14:03:29 -------------------------------------------------------------------------------- Nutrition Risk Screening Details Patient Name: Date of Service: [name] I. 11/15/2020 1:15 PM Medical Record Number: [id number] Patient Account Number: [id number] Date of Birth/Sex: Treating RN: 1945-07-06 (75 y.o. Female) Levan Hurst Primary Care Analisia Kingsford: Domingo Mend Other Clinician: Referring Ralynn San: Treating Mozel Burdett/Extender: Cheree Ditto in Treatment: 0 Height (in): 60 Weight (lbs): 117 Body Mass Index (BMI): 22.8 Nutrition Risk Screening Items Score Screening NUTRITION RISK SCREEN: I have an illness or condition that made me change the kind and/or amount of food I eat 2 Yes I eat fewer than two meals per day 0 No I eat few fruits and vegetables, or milk products 0 No I have three or more drinks of beer, liquor or wine almost every day 0 No I have tooth or mouth problems that make it hard for me to eat 0 No I don't always have enough money to buy the food I need 0 No I eat alone most of the time 0 No I take three or more different prescribed or over-the-counter drugs a day 1 Yes Without wanting to, I have lost or gained 10 pounds in the last six months 2 Yes I am not always physically able to shop, cook and/or feed myself 0 No Nutrition Protocols Good Risk Protocol Moderate Risk Protocol 0 Provide education on nutrition High Risk Proctocol Risk Level: Moderate Risk Score: 5 Electronic Signature(s) Signed: 11/15/2020 5:09:16 PM By: Dellie Catholic RN Signed: 11/16/2020 5:34:00 PM By: Levan Hurst RN, BSN Entered By: Dellie Catholic on 11/15/2020  14:01:46

## 2020-11-17 NOTE — Progress Notes (Signed)
[name], [name] ([id number]) Visit Report for 11/15/2020 Allergy List Details Patient Name: Date of Service: [name], [name] 11/15/2020 1:15 PM Medical Record Number: [id number] Patient Account Number: [id number] Date of Birth/Sex: Treating RN: April 25, 1945 (75 y.o. Female) [name] Primary Care [name]: [name] Other Clinician: Referring [name]: Treating [name] in Treatment: 0 Allergies Active Allergies adhesive tape Reaction: Redness and skin peeling Severity: Mild Active: 11/16/2019 aspirin Reaction: Intestinal Bleeding Severity: Moderate Active: 11/16/1995 ciprofloxacin Reaction: Nausea/Vomiting Severity: Moderate Active: 11/16/1995 codeine Reaction: N/V Severity: Moderate Active: 11/16/1995 Demerol Reaction: N/V Severity: Severe Active: 11/16/1995 Septra Reaction: Nausea Severity: Moderate Active: 11/16/1995 latex Reaction: Redness and skin peeling Severity: Severe Active: 11/16/1995 lactose Reaction: Pt. has Chrons Disease Severity: Moderate Active: 11/16/1995 Allergy Notes Electronic Signature(s) Signed: 11/15/2020 5:09:16 PM By: April Catholic RN Entered By: [name] on 11/15/2020 13:28:35 -------------------------------------------------------------------------------- Arrival Information Details Patient Name: Date of Service: April Austin I. 11/15/2020 1:15 PM Medical Record Number: [id number] Patient Account Number: [id number] Date of Birth/Sex: Treating RN: 07-23-1945 (75 y.o. Female) [name] Primary Care [name]: [name] Other Clinician: Referring [name]: Treating [name]/Extender: [name] in Treatment: 0 Visit Information Patient Arrived: Ambulatory Arrival Time: 13:13 Accompanied By: self Transfer Assistance: None Patient Identification Verified: Yes Secondary Verification Process Completed: Yes Patient Requires Transmission-Based Precautions:  No Patient Has Alerts: No Electronic Signature(s) Signed: 11/15/2020 5:09:16 PM By: April Catholic RN Entered By: [name] on 11/15/2020 13:15:08 -------------------------------------------------------------------------------- Clinic Level of Care Assessment Details Patient Name: Date of Service: [name] 11/15/2020 1:15 PM Medical Record Number: [id number] Patient Account Number: [id number] Date of Birth/Sex: Treating RN: 10/22/1945 (75 y.o. Female) [name] Primary Care Caroll Cunnington: [name] Other Clinician: Referring [name]: Treating [name]/Extender: [name] in Treatment: 0 Clinic Level of Care Assessment Items TOOL 1 Quantity Score X- 1 0 Use when EandM and Procedure is performed on INITIAL visit ASSESSMENTS - Nursing Assessment / Reassessment X- 1 20 General Physical Exam (combine w/ comprehensive assessment (listed just below) when performed on new pt. evals) X- 1 25 Comprehensive Assessment (HX, ROS, Risk Assessments, Wounds Hx, etc.) ASSESSMENTS - Wound and Skin Assessment / Reassessment []  - 0 Dermatologic / Skin Assessment (not related to wound area) ASSESSMENTS - Ostomy and/or Continence Assessment and Care []  - 0 Incontinence Assessment and Management []  - 0 Ostomy Care Assessment and Management (repouching, etc.) PROCESS - Coordination of Care X - Simple Patient / Family Education for ongoing care 1 15 []  - 0 Complex (extensive) Patient / Family Education for ongoing care X- 1 10 Staff obtains Programmer, systems, Records, T Results / Process Orders est []  - 0 Staff telephones HHA, Nursing Homes / Clarify orders / etc []  - 0 Routine Transfer to another Facility (non-emergent condition) []  - 0 Routine Hospital Admission (non-emergent condition) X- 1 15 New Admissions / Biomedical engineer / Ordering NPWT Apligraf, etc. , []  - 0 Emergency Hospital Admission (emergent condition) PROCESS - Special Needs []  -  0 Pediatric / Minor Patient Management []  - 0 Isolation Patient Management []  - 0 Hearing / Language / Visual special needs []  - 0 Assessment of Community assistance (transportation, D/C planning, etc.) []  - 0 Additional assistance / Altered mentation []  - 0 Support Surface(s) Assessment (bed, cushion, seat, etc.) INTERVENTIONS - Miscellaneous []  - 0 External ear exam []  - 0 Patient Transfer (multiple staff / Civil Service fast streamer / Similar devices) []  - 0 Simple Staple / Suture removal (25  or less) []  - 0 Complex Staple / Suture removal (26 or more) []  - 0 Hypo/Hyperglycemic Management (do not check if billed separately) X- 1 15 Ankle / Brachial Index (ABI) - do not check if billed separately Has the patient been seen at the hospital within the last three years: Yes Total Score: 100 Level Of Care: New/Established - Level 3 Electronic Signature(s) Signed: 11/16/2020 5:34:00 PM By: April Hurst RN, BSN Entered By: [name] on 11/15/2020 16:37:36 -------------------------------------------------------------------------------- Encounter Discharge Information Details Patient Name: Date of Service: April Austin I. 11/15/2020 1:15 PM Medical Record Number: [id number] Patient Account Number: [id number] Date of Birth/Sex: Treating RN: 04-07-1945 (75 y.o. Female) [name] Primary Care [name]: [name] Other Clinician: Referring [name]: Treating [name]/Extender: [name] in Treatment: 0 Encounter Discharge Information Items Post Procedure Vitals Discharge Condition: Stable Temperature (F): 97.4 Ambulatory Status: Ambulatory Pulse (bpm): 69 Discharge Destination: Home Respiratory Rate (breaths/min): 16 Transportation: Private Auto Blood Pressure (mmHg): 148/82 Accompanied By: alone Schedule Follow-up Appointment: Yes Clinical Summary of Care: Patient Declined Electronic Signature(s) Signed: 11/16/2020 5:34:00 PM By: April Hurst RN,  BSN Entered By: [name] on 11/15/2020 16:38:49 -------------------------------------------------------------------------------- Lower Extremity Assessment Details Patient Name: Date of Service: April Austin I. 11/15/2020 1:15 PM Medical Record Number: [id number] Patient Account Number: [id number] Date of Birth/Sex: Treating RN: 03/18/1945 (75 y.o. Female) Rhae Hammock Primary Care Claus Silvestro: [name] Other Clinician: Referring Sir Mallis: Treating Kynslie Ringle/Extender: [name] in Treatment: 0 Edema Assessment Assessed: Shirlyn Goltz: No] [Right: Yes] Edema: [Left: Ye] [Right: s] Calf Left: Right: Point of Measurement: 33 cm From Medial Instep 32 cm Ankle Left: Right: Point of Measurement: 7 cm From Medial Instep 19 cm Vascular Assessment Pulses: Dorsalis Pedis Palpable: [Right:Yes] Posterior Tibial Palpable: [Right:Yes] Blood Pressure: Brachial: [Right:148] Ankle: [Right:Dorsalis Pedis: 150 1.01] Electronic Signature(s) Signed: 11/15/2020 5:09:16 PM By: April Catholic RN Signed: 11/17/2020 12:27:53 PM By: Rhae Hammock RN Entered By: [name] on 11/15/2020 14:05:03 -------------------------------------------------------------------------------- Multi Wound Chart Details Patient Name: Date of Service: April Austin I. 11/15/2020 1:15 PM Medical Record Number: [id number] Patient Account Number: [id number] Date of Birth/Sex: Treating RN: 1945/03/15 (75 y.o. Female) [name] Primary Care Dainel Arcidiacono: [name] Other Clinician: Referring [name] Dano: Treating Jaimeson Gopal/Extender: [name] in Treatment: 0 Vital Signs Height(in): 60 Pulse(bpm): 69 Weight(lbs): 117 Blood Pressure(mmHg): 148/82 Body Mass Index(BMI): 23 Temperature(F): 97.4 Respiratory Rate(breaths/min): 16 Photos: [N/A:N/A] Right, Posterior Lower Leg N/A N/A Wound Location: Gradually Appeared N/A N/A Wounding Event: Venous Leg Ulcer N/A  N/A Primary Etiology: 09/14/2020 N/A N/A Date Acquired: 0 N/A N/A Weeks of Treatment: Open N/A N/A Wound Status: 5x4.3x0.1 N/A N/A Measurements L x W x D (cm) 16.886 N/A N/A A (cm) : rea 1.689 N/A N/A Volume (cm) : 0.00% N/A N/A % Reduction in A rea: 0.00% N/A N/A % Reduction in Volume: Full Thickness Without Exposed N/A N/A Classification: Support Structures Medium N/A N/A Exudate A mount: Serosanguineous N/A N/A Exudate Type: red, brown N/A N/A Exudate Color: Distinct, outline attached N/A N/A Wound Margin: Large (67-100%) N/A N/A Granulation A mount: Red, Pink N/A N/A Granulation Quality: Small (1-33%) N/A N/A Necrotic A mount: Fascia: No N/A N/A Exposed Structures: Fat Layer (Subcutaneous Tissue): No Tendon: No Muscle: No Joint: No Bone: No None N/A N/A Epithelialization: Chemical/Enzymatic/Mechanical N/A N/A Debridement: Pre-procedure Verification/Time Out 14:25 N/A N/A Taken: N/A N/A N/A Instrument: None N/A N/A Bleeding: 0 N/A N/A Procedural Pain: 0 N/A N/A Post Procedural Pain: Procedure was  tolerated well N/A N/A Debridement Treatment Response: 5x4.3x0.1 N/A N/A Post Debridement Measurements L x W x D (cm) 1.689 N/A N/A Post Debridement Volume: (cm) Biopsy N/A N/A Procedures Performed: Debridement Treatment Notes Electronic Signature(s) Signed: 11/15/2020 4:30:00 PM By: [name] MD Signed: 11/16/2020 5:34:00 PM By: April Hurst RN, BSN Entered By: [name] on 11/15/2020 15:48:25 -------------------------------------------------------------------------------- Multi-Disciplinary Care Plan Details Patient Name: Date of Service: April Austin I. 11/15/2020 1:15 PM Medical Record Number: [id number] Patient Account Number: [id number] Date of Birth/Sex: Treating RN: 11-10-1945 (75 y.o. Female) [name] Primary Care Loralee Weitzman: [name] Other Clinician: Referring Margeaux Swantek: Treating Odessa Morren/Extender:  [name] in Treatment: 0 Multidisciplinary Care Plan reviewed with physician Active Inactive Wound/Skin Impairment Nursing Diagnoses: Impaired tissue integrity Knowledge deficit related to ulceration/compromised skin integrity Goals: Patient/caregiver will verbalize understanding of skin care regimen Date Initiated: 11/15/2020 Target Resolution Date: 12/15/2020 Goal Status: Active Ulcer/skin breakdown will have a volume reduction of 30% by week 4 Date Initiated: 11/15/2020 Target Resolution Date: 12/15/2020 Goal Status: Active Interventions: Assess patient/caregiver ability to obtain necessary supplies Assess patient/caregiver ability to perform ulcer/skin care regimen upon admission and as needed Assess ulceration(s) every visit Provide education on ulcer and skin care Notes: Electronic Signature(s) Signed: 11/16/2020 5:34:00 PM By: April Hurst RN, BSN Entered By: [name] on 11/15/2020 14:16:00 -------------------------------------------------------------------------------- Pain Assessment Details Patient Name: Date of Service: April Austin I. 11/15/2020 1:15 PM Medical Record Number: [id number] Patient Account Number: [id number] Date of Birth/Sex: Treating RN: 12/13/1945 (75 y.o. Female) [name] Primary Care Wanda Rideout: [name] Other Clinician: Referring Anothony Bursch: Treating Sweta Halseth/Extender: [name] in Treatment: 0 Active Problems Location of Pain Severity and Description of Pain Patient Has Paino No Site Locations Rate the pain. Current Pain Level: 0 Pain Management and Medication Current Pain Management: Electronic Signature(s) Signed: 11/15/2020 5:09:16 PM By: April Catholic RN Signed: 11/16/2020 5:34:00 PM By: April Hurst RN, BSN Entered By: [name] on 11/15/2020 14:05:31 -------------------------------------------------------------------------------- Patient/Caregiver Education Details Patient  Name: Date of Service: April Austin I. 11/9/2022andnbsp1:15 PM Medical Record Number: [id number] Patient Account Number: [id number] Date of Birth/Gender: Treating RN: January 07, 1946 (75 y.o. Female) [name] Primary Care Physician: [name] Other Clinician: Referring Physician: Treating Physician/Extender: [name] in Treatment: 0 Education Assessment Education Provided To: Patient Education Topics Provided Wound/Skin Impairment: Methods: Explain/Verbal Responses: State content correctly Electronic Signature(s) Signed: 11/16/2020 5:34:00 PM By: April Hurst RN, BSN Entered By: [name] on 11/15/2020 14:16:30 -------------------------------------------------------------------------------- Wound Assessment Details Patient Name: Date of Service: April Austin I. 11/15/2020 1:15 PM Medical Record Number: [id number] Patient Account Number: [id number] Date of Birth/Sex: Treating RN: [birth year] (75 y.o. Female) [name] Primary Care Katy Brickell: [name] Other Clinician: Referring Jaisha Villacres: Treating Yarelly Kuba/Extender: [name] in Treatment: 0 Wound Status Wound Number: 1 Primary Etiology: Venous Leg Ulcer Wound Location: Right, Posterior Lower Leg Wound Status: Open Wounding Event: Gradually Appeared Date Acquired: 09/14/2020 Weeks Of Treatment: 0 Clustered Wound: No Photos Wound Measurements Length: (cm) 5 Width: (cm) 4.3 Depth: (cm) 0.1 Area: (cm) 16.886 Volume: (cm) 1.689 % Reduction in Area: 0% % Reduction in Volume: 0% Epithelialization: None Tunneling: No Undermining: No Wound Description Classification: Full Thickness Without Exposed Support Struc Wound Margin: Distinct, outline attached Exudate Amount: Medium Exudate Type: Serosanguineous Exudate Color: red, brown tures Foul Odor After Cleansing: No Slough/Fibrino No Wound Bed Granulation Amount: Large (67-100%) Exposed Structure Granulation  Quality: Red, Pink Fascia Exposed: No Necrotic Amount:  Small (1-33%) Fat Layer (Subcutaneous Tissue) Exposed: No Necrotic Quality: Adherent Slough Tendon Exposed: No Muscle Exposed: No Joint Exposed: No Bone Exposed: No Treatment Notes Wound #1 (Lower Leg) Wound Laterality: Right, Posterior Cleanser Soap and Water Discharge Instruction: May shower and wash wound with dial antibacterial soap and water prior to dressing change. [employer] Ancillary Kit - 15 Day Supply Discharge Instruction: Use supplies as instructed; Kit contains: (15) Saline Bullets; (15) 3x3 Gauze; 15 pr Gloves Peri-Wound Care Topical Primary Dressing KerraCel Ag Gelling Fiber Dressing, 4x5 in (silver alginate) Discharge Instruction: Apply silver alginate to wound bed as instructed Secondary Dressing Woven Gauze Sponge, Non-Sterile 4x4 in Discharge Instruction: Apply over primary dressing as directed. Secured With Paper Tape, 2x10 (in/yd) Discharge Instruction: Secure dressing with tape as directed. Conforming Stretch Gauze Bandage Roll, Sterile 4x75 (in/in) Discharge Instruction: Secure with stretch gauze as directed. Compression Wrap Compression Stockings Add-Ons Electronic Signature(s) Signed: 11/15/2020 1:53:08 PM By: Sandre Kitty Signed: 11/16/2020 5:34:00 PM By: April Hurst RN, BSN Entered By: Sandre Kitty on 11/15/2020 13:29:03 -------------------------------------------------------------------------------- Vitals Details Patient Name: Date of Service: April Austin I. 11/15/2020 1:15 PM Medical Record Number: [id number] Patient Account Number: [id number] Date of Birth/Sex: Treating RN: 28-Jan-1945 (75 y.o. Female) [name] Primary Care Jalyn Dutta: [name] Other Clinician: Referring Owin Vignola: Treating Kimoni Pagliarulo/Extender: [name] in Treatment: 0 Vital Signs Time Taken: 13:15 Temperature (F): 97.4 Height (in): 60 Pulse (bpm): 69 Weight (lbs):  117 Respiratory Rate (breaths/min): 16 Body Mass Index (BMI): 22.8 Blood Pressure (mmHg): 148/82 Reference Range: 80 - 120 mg / dl Electronic Signature(s) Signed: 11/15/2020 5:09:16 PM By: April Catholic RN Entered By: [name] on 11/15/2020 13:17:14

## 2020-11-22 ENCOUNTER — Other Ambulatory Visit: Payer: Self-pay

## 2020-11-22 ENCOUNTER — Encounter (HOSPITAL_BASED_OUTPATIENT_CLINIC_OR_DEPARTMENT_OTHER): Payer: Medicare Other | Admitting: Internal Medicine

## 2020-11-22 DIAGNOSIS — L97211 Non-pressure chronic ulcer of right calf limited to breakdown of skin: Secondary | ICD-10-CM | POA: Diagnosis not present

## 2020-11-23 ENCOUNTER — Encounter: Payer: Self-pay | Admitting: Internal Medicine

## 2020-11-23 ENCOUNTER — Ambulatory Visit (AMBULATORY_SURGERY_CENTER): Payer: Medicare Other | Admitting: Internal Medicine

## 2020-11-23 ENCOUNTER — Other Ambulatory Visit: Payer: Self-pay

## 2020-11-23 VITALS — BP 112/69 | HR 79 | Temp 98.0°F | Resp 16 | Ht 60.0 in | Wt 124.0 lb

## 2020-11-23 DIAGNOSIS — K50919 Crohn's disease, unspecified, with unspecified complications: Secondary | ICD-10-CM | POA: Diagnosis not present

## 2020-11-23 DIAGNOSIS — K51511 Left sided colitis with rectal bleeding: Secondary | ICD-10-CM

## 2020-11-23 DIAGNOSIS — R197 Diarrhea, unspecified: Secondary | ICD-10-CM | POA: Diagnosis not present

## 2020-11-23 DIAGNOSIS — K50111 Crohn's disease of large intestine with rectal bleeding: Secondary | ICD-10-CM

## 2020-11-23 DIAGNOSIS — K625 Hemorrhage of anus and rectum: Secondary | ICD-10-CM | POA: Diagnosis not present

## 2020-11-23 DIAGNOSIS — R109 Unspecified abdominal pain: Secondary | ICD-10-CM

## 2020-11-23 MED ORDER — SODIUM CHLORIDE 0.9 % IV SOLN
500.0000 mL | INTRAVENOUS | Status: DC
Start: 2020-11-23 — End: 2020-11-23

## 2020-11-23 NOTE — Op Note (Signed)
[place] Patient Name: [name] Procedure Date: 11/23/2020 2:34 PM MRN: [id number] Endoscopist: Docia Chuck. Henrene Pastor , MD Age: 75 Referring MD:  Date of Birth: 02/27/1945 Gender: Female Account #: [id number] Procedure:                Colonoscopy with biopsies Indications:              Diarrhea, Rectal bleeding. History of Crohn's                            colitis diagnosed elsewhere. Last colonoscopy                            elsewhere 2019. Medicines:                Monitored Anesthesia Care Procedure:                Pre-Anesthesia Assessment:                           - Prior to the procedure, a History and Physical                            was performed, and patient medications and                            allergies were reviewed. The patient's tolerance of                            previous anesthesia was also reviewed. The risks                            and benefits of the procedure and the sedation                            options and risks were discussed with the patient.                            All questions were answered, and informed consent                            was obtained. Prior Anticoagulants: The patient has                            taken no previous anticoagulant or antiplatelet                            agents. ASA Grade Assessment: II - A patient with                            mild systemic disease. After reviewing the risks                            and benefits, the patient was deemed in  satisfactory condition to undergo the procedure.                           After obtaining informed consent, the colonoscope                            was passed under direct vision. Throughout the                            procedure, the patient's blood pressure, pulse, and                            oxygen saturations were monitored continuously. The                            PCF-HQ190L Colonoscope was introduced  through the                            anus and advanced to the the cecum, identified by                            appendiceal orifice and ileocecal valve. The                            terminal ileum, ileocecal valve, appendiceal                            orifice, and rectum were photographed. The quality                            of the bowel preparation was good. The colonoscopy                            was performed without difficulty. The patient                            tolerated the procedure well. The bowel preparation                            used was Prepopik via split dose instruction. Scope In: 2:52:31 PM Scope Out: 3:07:38 PM Scope Withdrawal Time: 0 hours 10 minutes 39 seconds  Total Procedure Duration: 0 hours 15 minutes 7 seconds  Findings:                 The terminal ileum appeared normal.                           The colon was scarred throughout with loss of                            haustral features and normal vascular pattern.                            There was some mild patchy erythema. No polyps or  mass. Multiple biopsies were taken throughout the                            colon with a cold forceps for histology. Complications:            No immediate complications. Estimated blood loss:                            None. Estimated Blood Loss:     Estimated blood loss: none. Impression:               1. Normal ileum                           2. Changes of chronic colitis with trivial patchy                            inflammation status post biopsies. Recommendation:           - Repeat colonoscopy is not recommended for                            surveillance.                           - Patient has a contact number available for                            emergencies. The signs and symptoms of potential                            delayed complications were discussed with the                            patient. Return to  normal activities tomorrow.                            Written discharge instructions were provided to the                            patient.                           - Resume previous diet.                           - Continue present medications.                           - Await pathology results.                           - Office follow-up with Dr. Henrene Pastor no later than 1                            year. Sooner if needed [employer]. Henrene Pastor, MD 11/23/2020 3:14:35 PM This report has been signed electronically.

## 2020-11-23 NOTE — Progress Notes (Signed)
[name], [name] ([id number]) Visit Report for 11/22/2020 Arrival Information Details Patient Name: Date of Service: [name], [name] I. 11/22/2020 2:00 PM Medical Record Number: [id number] Patient Account Number: [id number] Date of Birth/Sex: Treating RN: [birth year] (75 y.o. Nancy Fetter Primary Care Levar Fayson: Domingo Mend Other Clinician: Referring Malachy Coleman: Treating Zamari Vea/Extender: Allene Pyo in Treatment: 1 Visit Information History Since Last Visit Added or deleted any medications: No Patient Arrived: Ambulatory Any new allergies or adverse reactions: No Arrival Time: 14:19 Had a fall or experienced change in No Accompanied By: self activities of daily living that may affect Transfer Assistance: None risk of falls: Patient Identification Verified: Yes Signs or symptoms of abuse/neglect since last visito No Secondary Verification Process Completed: Yes Hospitalized since last visit: No Patient Requires Transmission-Based Precautions: No Implantable device outside of the clinic excluding No Patient Has Alerts: No cellular tissue based products placed in the center since last visit: Has Dressing in Place as Prescribed: Yes Pain Present Now: No Electronic Signature(s) Signed: 11/22/2020 2:27:45 PM By: Sandre Kitty Entered By: Sandre Kitty on 11/22/2020 14:20:57 -------------------------------------------------------------------------------- Clinic Level of Care Assessment Details Patient Name: Date of Service: [name], [name] I. 11/22/2020 2:00 PM Medical Record Number: [id number] Patient Account Number: [id number] Date of Birth/Sex: Treating RN: 1945-05-16 (75 y.o. Tonita Phoenix, Lauren Primary Care Deran Barro: Domingo Mend Other Clinician: Referring Shylynn Bruning: Treating Tung Pustejovsky/Extender: Allene Pyo in Treatment: 1 Clinic Level of Care Assessment Items TOOL 4 Quantity Score X- 1 0 Use  when only an EandM is performed on FOLLOW-UP visit ASSESSMENTS - Nursing Assessment / Reassessment X- 1 10 Reassessment of Co-morbidities (includes updates in patient status) X- 1 5 Reassessment of Adherence to Treatment Plan ASSESSMENTS - Wound and Skin A ssessment / Reassessment X - Simple Wound Assessment / Reassessment - one wound 1 5 []  - 0 Complex Wound Assessment / Reassessment - multiple wounds X- 1 10 Dermatologic / Skin Assessment (not related to wound area) ASSESSMENTS - Focused Assessment X- 1 5 Circumferential Edema Measurements - multi extremities []  - 0 Nutritional Assessment / Counseling / Intervention []  - 0 Lower Extremity Assessment (monofilament, tuning fork, pulses) []  - 0 Peripheral Arterial Disease Assessment (using hand held doppler) ASSESSMENTS - Ostomy and/or Continence Assessment and Care []  - 0 Incontinence Assessment and Management []  - 0 Ostomy Care Assessment and Management (repouching, etc.) PROCESS - Coordination of Care X - Simple Patient / Family Education for ongoing care 1 15 []  - 0 Complex (extensive) Patient / Family Education for ongoing care X- 1 10 Staff obtains Programmer, systems, Records, T Results / Process Orders est []  - 0 Staff telephones HHA, Nursing Homes / Clarify orders / etc []  - 0 Routine Transfer to another Facility (non-emergent condition) []  - 0 Routine Hospital Admission (non-emergent condition) []  - 0 New Admissions / Biomedical engineer / Ordering NPWT Apligraf, etc. , []  - 0 Emergency Hospital Admission (emergent condition) X- 1 10 Simple Discharge Coordination []  - 0 Complex (extensive) Discharge Coordination PROCESS - Special Needs []  - 0 Pediatric / Minor Patient Management []  - 0 Isolation Patient Management []  - 0 Hearing / Language / Visual special needs []  - 0 Assessment of Community assistance (transportation, D/C planning, etc.) []  - 0 Additional assistance / Altered mentation []  - 0 Support  Surface(s) Assessment (bed, cushion, seat, etc.) INTERVENTIONS - Wound Cleansing / Measurement X - Simple Wound Cleansing - one wound 1 5 []  - 0 Complex Wound  Cleansing - multiple wounds X- 1 5 Wound Imaging (photographs - any number of wounds) []  - 0 Wound Tracing (instead of photographs) X- 1 5 Simple Wound Measurement - one wound []  - 0 Complex Wound Measurement - multiple wounds INTERVENTIONS - Wound Dressings X - Small Wound Dressing one or multiple wounds 1 10 []  - 0 Medium Wound Dressing one or multiple wounds []  - 0 Large Wound Dressing one or multiple wounds X- 1 5 Application of Medications - topical []  - 0 Application of Medications - injection INTERVENTIONS - Miscellaneous []  - 0 External ear exam []  - 0 Specimen Collection (cultures, biopsies, blood, body fluids, etc.) []  - 0 Specimen(s) / Culture(s) sent or taken to Lab for analysis []  - 0 Patient Transfer (multiple staff / Civil Service fast streamer / Similar devices) []  - 0 Simple Staple / Suture removal (25 or less) []  - 0 Complex Staple / Suture removal (26 or more) []  - 0 Hypo / Hyperglycemic Management (close monitor of Blood Glucose) []  - 0 Ankle / Brachial Index (ABI) - do not check if billed separately X- 1 5 Vital Signs Has the patient been seen at the hospital within the last three years: Yes Total Score: 105 Level Of Care: New/Established - Level 3 Electronic Signature(s) Signed: 11/23/2020 5:03:29 PM By: Rhae Hammock RN Entered By: Rhae Hammock on 11/22/2020 14:46:05 -------------------------------------------------------------------------------- Encounter Discharge Information Details Patient Name: Date of Service: [name] I. 11/22/2020 2:00 PM Medical Record Number: [id number] Patient Account Number: [id number] Date of Birth/Sex: Treating RN: 1945-11-29 (75 y.o. Tonita Phoenix, Lauren Primary Care Jef Futch: Domingo Mend Other Clinician: Referring Omolola Mittman: Treating  Kiyo Heal/Extender: Allene Pyo in Treatment: 1 Encounter Discharge Information Items Discharge Condition: Stable Ambulatory Status: Ambulatory Discharge Destination: Home Transportation: Private Auto Accompanied By: self Schedule Follow-up Appointment: Yes Clinical Summary of Care: Patient Declined Electronic Signature(s) Signed: 11/23/2020 5:03:29 PM By: Rhae Hammock RN Entered By: Rhae Hammock on 11/22/2020 14:46:50 -------------------------------------------------------------------------------- Multi-Disciplinary Care Plan Details Patient Name: Date of Service: [name] I. 11/22/2020 2:00 PM Medical Record Number: [id number] Patient Account Number: [id number] Date of Birth/Sex: Treating RN: 07-Aug-1945 (75 y.o. Tonita Phoenix, Lauren Primary Care Joi Leyva: Domingo Mend Other Clinician: Referring Cheng Dec: Treating Tilley Faeth/Extender: Allene Pyo in Treatment: 1 Multidisciplinary Care Plan reviewed with physician Active Inactive Wound/Skin Impairment Nursing Diagnoses: Impaired tissue integrity Knowledge deficit related to ulceration/compromised skin integrity Goals: Patient/caregiver will verbalize understanding of skin care regimen Date Initiated: 11/15/2020 Target Resolution Date: 12/15/2020 Goal Status: Active Ulcer/skin breakdown will have a volume reduction of 30% by week 4 Date Initiated: 11/15/2020 Target Resolution Date: 12/15/2020 Goal Status: Active Interventions: Assess patient/caregiver ability to obtain necessary supplies Assess patient/caregiver ability to perform ulcer/skin care regimen upon admission and as needed Assess ulceration(s) every visit Provide education on ulcer and skin care Notes: Electronic Signature(s) Signed: 11/23/2020 5:03:29 PM By: Rhae Hammock RN Entered By: Rhae Hammock on 11/22/2020  14:44:30 -------------------------------------------------------------------------------- Pain Assessment Details Patient Name: Date of Service: [name] I. 11/22/2020 2:00 PM Medical Record Number: [id number] Patient Account Number: [id number] Date of Birth/Sex: Treating RN: 03-07-1945 (75 y.o. Nancy Fetter Primary Care Nesiah Jump: Domingo Mend Other Clinician: Referring Keyle Doby: Treating Rowan [name]/Extender: Allene Pyo in Treatment: 1 Active Problems Location of Pain Severity and Description of Pain Patient Has Paino No Site Locations Pain Management and Medication Current Pain Management: Electronic Signature(s) Signed: 11/22/2020 2:27:45 PM By: Sandre Kitty Signed:  11/23/2020 5:23:58 PM By: Levan Hurst RN, BSN Entered By: Sandre Kitty on 11/22/2020 14:21:29 -------------------------------------------------------------------------------- Patient/Caregiver Education Details Patient Name: Date of Service: [name] I. 11/16/2022andnbsp2:00 PM Medical Record Number: [id number] Patient Account Number: [id number] Date of Birth/Gender: Treating RN: [birth year] (75 y.o. Benjaman Lobe Primary Care Physician: Domingo Mend Other Clinician: Referring Physician: Treating Physician/Extender: Allene Pyo in Treatment: 1 Education Assessment Education Provided To: Patient Education Topics Provided Wound/Skin Impairment: Methods: Explain/Verbal Responses: State content correctly Electronic Signature(s) Signed: 11/23/2020 5:03:29 PM By: Rhae Hammock RN Entered By: Rhae Hammock on 11/22/2020 14:44:50 -------------------------------------------------------------------------------- Wound Assessment Details Patient Name: Date of Service: [name] I. 11/22/2020 2:00 PM Medical Record Number: [id number] Patient Account Number: [id number] Date of Birth/Sex: Treating  RN: Jul 07, 1945 (75 y.o. Nancy Fetter Primary Care Curry Seefeldt: Domingo Mend Other Clinician: Referring Ewin Rehberg: Treating Zakhari Fogel/Extender: Allene Pyo in Treatment: 1 Wound Status Wound Number: 1 Primary Venous Leg Ulcer Etiology: Wound Location: Right, Posterior Lower Leg Wound Status: Open Wounding Event: Gradually Appeared Comorbid Cataracts, Chronic sinus problems/congestion, Crohns, Date Acquired: 09/14/2020 History: Osteoarthritis Weeks Of Treatment: 1 Clustered Wound: No Photos Wound Measurements Length: (cm) 4.2 Width: (cm) 1.2 Depth: (cm) 0.1 Area: (cm) 3.958 Volume: (cm) 0.396 % Reduction in Area: 76.6% % Reduction in Volume: 76.6% Epithelialization: None Wound Description Classification: Full Thickness Without Exposed Support Structures Wound Margin: Distinct, outline attached Exudate Amount: Medium Exudate Type: Serosanguineous Exudate Color: red, brown Foul Odor After Cleansing: No Slough/Fibrino No Wound Bed Granulation Amount: Large (67-100%) Exposed Structure Granulation Quality: Red, Pink Fascia Exposed: No Necrotic Amount: Small (1-33%) Fat Layer (Subcutaneous Tissue) Exposed: No Necrotic Quality: Adherent Slough Tendon Exposed: No Muscle Exposed: No Joint Exposed: No Bone Exposed: No Treatment Notes Wound #1 (Lower Leg) Wound Laterality: Right, Posterior Cleanser Soap and Water Discharge Instruction: May shower and wash wound with dial antibacterial soap and water prior to dressing change. [employer] Ancillary Kit - 15 Day Supply Discharge Instruction: Use supplies as instructed; Kit contains: (15) Saline Bullets; (15) 3x3 Gauze; 15 pr Gloves Peri-Wound Care Topical Primary Dressing KerraCel Ag Gelling Fiber Dressing, 4x5 in (silver alginate) Discharge Instruction: Apply silver alginate to wound bed as instructed Secondary Dressing Woven Gauze Sponge, Non-Sterile 4x4 in Discharge Instruction: Apply over  primary dressing as directed. Secured With Paper Tape, 2x10 (in/yd) Discharge Instruction: Secure dressing with tape as directed. Conforming Stretch Gauze Bandage Roll, Sterile 4x75 (in/in) Discharge Instruction: Secure with stretch gauze as directed. Compression Wrap Compression Stockings Add-Ons Electronic Signature(s) Signed: 11/22/2020 2:27:45 PM By: Sandre Kitty Signed: 11/23/2020 5:23:58 PM By: Levan Hurst RN, BSN Entered By: Sandre Kitty on 11/22/2020 14:26:47 -------------------------------------------------------------------------------- Vitals Details Patient Name: Date of Service: [name] I. 11/22/2020 2:00 PM Medical Record Number: [id number] Patient Account Number: [id number] Date of Birth/Sex: Treating RN: 1945/03/12 (75 y.o. Nancy Fetter Primary Care Jafeth Mustin: Domingo Mend Other Clinician: Referring Campbell Agramonte: Treating Gregorio Worley/Extender: Allene Pyo in Treatment: 1 Vital Signs Time Taken: 14:21 Temperature (F): 98.1 Height (in): 60 Pulse (bpm): 78 Weight (lbs): 117 Respiratory Rate (breaths/min): 16 Body Mass Index (BMI): 22.8 Blood Pressure (mmHg): 126/73 Reference Range: 80 - 120 mg / dl Electronic Signature(s) Signed: 11/22/2020 2:27:45 PM By: Sandre Kitty Entered By: Sandre Kitty on 11/22/2020 14:21:19

## 2020-11-23 NOTE — Progress Notes (Signed)
Pt's states no medical or surgical changes since previsit or office visit. 

## 2020-11-23 NOTE — Progress Notes (Signed)
[name], [name] ([id number]) Visit Report for 11/22/2020 HPI Details Patient Name: Date of Service: [name], [name]. 11/22/2020 2:00 PM Medical Record Number: [id number] Patient Account Number: [id number] Date of Birth/Sex: Treating RN: Apr 12, 1945 (75 y.o. Nancy Fetter Primary Care Provider: Domingo Mend Other Clinician: Referring Provider: Treating Provider/Extender: Allene Pyo in Treatment: 1 History of Present Illness HPI Description: ADMISSION 11/15/2020 This is a 75 year old woman with a history of squamous cell cancer skin cancer followed by Dr. Anabel Bene dermatology. She describes having a wound on the right posterior calf dating back since September. She saw her primary doctor in early October who suggested petroleum infused gauze. The patient has been doing this. No real improvement in fact she says this is getting larger. She does not complain much of pain. She is completely uncertain about how this happened there was no trauma she simply became aware of it at some point describing it is about the size of a quarter but it has been getting progressively larger since then. She is here for review of this. Past medical history Crohn's disease not currently on prednisone, gastroesophageal reflux disease, history of squamous cell skin cancer, history of rheumatoid arthritis, hypertension 11/16; this lady had a large superficial atypical wound on the right posterior calf. She has significant solar skin damage probably some degree of venous insufficiency but she described a rapidly expanding wound area. Because of this and a history of skin cancer I went ahead and did a shave biopsy but we do not have the results of this at the time we saw her in clinic today. She has been using silver alginate and kerlix Electronic Signature(s) Signed: 11/23/2020 1:42:03 PM By: [name] MD Entered By: [name] on 11/22/2020  14:53:20 -------------------------------------------------------------------------------- Physical Exam Details Patient Name: Date of Service: April Austin I. 11/22/2020 2:00 PM Medical Record Number: [id number] Patient Account Number: [id number] Date of Birth/Sex: Treating RN: February 01, 1945 (75 y.o. Nancy Fetter Primary Care Provider: Domingo Mend Other Clinician: Referring Provider: Treating Provider/Extender: Allene Pyo in Treatment: 1 Constitutional Sitting or standing Blood Pressure is within target range for patient.. Pulse regular and within target range for patient.Marland Kitchen Respirations regular, non-labored and within target range.. Temperature is normal and within the target range for the patient.Marland Kitchen Appears in no distress. Cardiovascular Pedal pulses are palpable. Fairly clear evidence of some degree of venous hypertension as well. 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 Notes Wound exam; right posterior calf. This is considerably better than last week almost surprising. Almost all of this is epithelialized. No debridement is required. We have good edema control. There is no evidence of surrounding infection Electronic Signature(s) Signed: 11/23/2020 1:42:03 PM By: [name] MD Signed: 11/23/2020 1:42:03 PM By: [name] MD Entered By: [name] on 11/22/2020 14:55:24 -------------------------------------------------------------------------------- Physician Orders Details Patient Name: Date of Service: April Austin I. 11/22/2020 2:00 PM Medical Record Number: [id number] Patient Account Number: [id number] Date of Birth/Sex: Treating RN: 05-Apr-1945 (75 y.o. Tonita Phoenix, Lauren Primary Care Provider: Domingo Mend Other Clinician: Referring Provider: Treating Provider/Extender: Allene Pyo in Treatment: 1 Verbal / Phone Orders: No Diagnosis  Coding Follow-up Appointments ppointment in 2 weeks. - Dr. Dellia Nims Return A Bathing/ Shower/ Hygiene May shower and wash wound with soap and water. - prior to dressing change Wound Treatment Wound #1 - Lower Leg Wound Laterality: Right, Posterior Cleanser: Soap and  Water 1 x Per Day/15 Days Discharge Instructions: May shower and wash wound with dial antibacterial soap and water prior to dressing change. Cleanser: Byram Ancillary Kit - 15 Day Supply (Generic) 1 x Per Day/15 Days Discharge Instructions: Use supplies as instructed; Kit contains: (15) Saline Bullets; (15) 3x3 Gauze; 15 pr Gloves Prim Dressing: KerraCel Ag Gelling Fiber Dressing, 4x5 in (silver alginate) (Generic) 1 x Per Day/15 Days ary Discharge Instructions: Apply silver alginate to wound bed as instructed Secondary Dressing: Woven Gauze Sponge, Non-Sterile 4x4 in (Generic) 1 x Per Day/15 Days Discharge Instructions: Apply over primary dressing as directed. Secured With: Paper Tape, 2x10 (in/yd) (Generic) 1 x Per Day/15 Days Discharge Instructions: Secure dressing with tape as directed. Secured With: Scientist, forensic, Sterile 4x75 (in/in) (Generic) 1 x Per Day/15 Days Discharge Instructions: Secure with stretch gauze as directed. Electronic Signature(s) Signed: 11/23/2020 1:42:03 PM By: [name] MD Signed: 11/23/2020 5:03:29 PM By: Rhae Hammock RN Entered By: Rhae Hammock on 11/22/2020 14:43:02 -------------------------------------------------------------------------------- Problem List Details Patient Name: Date of Service: April Austin I. 11/22/2020 2:00 PM Medical Record Number: [id number] Patient Account Number: [id number] Date of Birth/Sex: Treating RN: Sep 05, 1945 (75 y.o. Nancy Fetter Primary Care Provider: Domingo Mend Other Clinician: Referring Provider: Treating Provider/Extender: Ladean Raya , Magdalen Spatz in Treatment: 1 Active  Problems ICD-10 Encounter Code Description Active Date MDM Diagnosis L97.211 Non-pressure chronic ulcer of right calf limited to breakdown of skin 11/15/2020 No Yes I87.301 Chronic venous hypertension (idiopathic) without complications of right lower 11/15/2020 No Yes extremity K50.919 Crohn's disease, unspecified, with unspecified complications 22/02/9796 No Yes Inactive Problems Resolved Problems Electronic Signature(s) Signed: 11/23/2020 1:42:03 PM By: [name] MD Entered By: [name] on 11/22/2020 14:50:52 -------------------------------------------------------------------------------- Progress Note Details Patient Name: Date of Service: April Austin I. 11/22/2020 2:00 PM Medical Record Number: [id number] Patient Account Number: [id number] Date of Birth/Sex: Treating RN: June 28, 1945 (75 y.o. Nancy Fetter Primary Care Provider: Domingo Mend Other Clinician: Referring Provider: Treating Provider/Extender: Ladean Raya , Magdalen Spatz in Treatment: 1 Subjective History of Present Illness (HPI) ADMISSION 11/15/2020 This is a 75 year old woman with a history of squamous cell cancer skin cancer followed by Dr. Anabel Bene dermatology. She describes having a wound on the right posterior calf dating back since September. She saw her primary doctor in early October who suggested petroleum infused gauze. The patient has been doing this. No real improvement in fact she says this is getting larger. She does not complain much of pain. She is completely uncertain about how this happened there was no trauma she simply became aware of it at some point describing it is about the size of a quarter but it has been getting progressively larger since then. She is here for review of this. Past medical history Crohn's disease not currently on prednisone, gastroesophageal reflux disease, history of squamous cell skin cancer, history of rheumatoid arthritis,  hypertension 11/16; this lady had a large superficial atypical wound on the right posterior calf. She has significant solar skin damage probably some degree of venous insufficiency but she described a rapidly expanding wound area. Because of this and a history of skin cancer I went ahead and did a shave biopsy but we do not have the results of this at the time we saw her in clinic today. She has been using silver alginate and kerlix Objective Constitutional Sitting or standing Blood Pressure is within target range for patient.. Pulse regular and within  target range for patient.Marland Kitchen Respirations regular, non-labored and within target range.. Temperature is normal and within the target range for the patient.Marland Kitchen Appears in no distress. Vitals Time Taken: 2:21 PM, Height: 60 in, Weight: 117 lbs, BMI: 22.8, Temperature: 98.1 F, Pulse: 78 bpm, Respiratory Rate: 16 breaths/min, Blood Pressure: 126/73 mmHg. Cardiovascular Pedal pulses are palpable. Fairly clear evidence of some degree of venous hypertension as well. General Notes: Wound exam; right posterior calf. This is considerably better than last week almost surprising. Almost all of this is epithelialized. No debridement is required. We have good edema control. 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 Wound #1 status is Open. Original cause of wound was Gradually Appeared. The date acquired was: 09/14/2020. The wound has been in treatment 1 weeks. The wound is located on the Right,Posterior Lower Leg. The wound measures 4.2cm length x 1.2cm width x 0.1cm depth; 3.958cm^2 area and 0.396cm^3 volume. There is a medium amount of serosanguineous drainage noted. The wound margin is distinct with the outline attached to the wound base. There is large (67- 100%) red, pink granulation within the wound bed. There is a small (1-33%) amount of necrotic tissue within the wound bed including  Adherent Slough. 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 - Dr. Dellia Nims Bathing/ Shower/ Hygiene: May shower and wash wound with soap and water. - prior to dressing change WOUND #1: - Lower Leg Wound Laterality: Right, Posterior Cleanser: Soap and Water 1 x Per FYB/01 Days Discharge Instructions: May shower and wash wound with dial antibacterial soap and water prior to dressing change. Cleanser: Byram Ancillary Kit - 15 Day Supply (Generic) 1 x Per Day/15 Days Discharge Instructions: Use supplies as instructed; Kit contains: (15) Saline Bullets; (15) 3x3 Gauze; 15 pr Gloves Prim Dressing: KerraCel Ag Gelling Fiber Dressing, 4x5 in (silver alginate) (Generic) 1 x Per Day/15 Days ary Discharge Instructions: Apply silver alginate to wound bed as instructed Secondary Dressing: Woven Gauze Sponge, Non-Sterile 4x4 in (Generic) 1 x Per Day/15 Days Discharge Instructions: Apply over primary dressing as directed. Secured With: Paper Tape, 2x10 (in/yd) (Generic) 1 x Per Day/15 Days Discharge Instructions: Secure dressing with tape as directed. Secured With: Scientist, forensic, Sterile 4x75 (in/in) (Generic) 1 x Per Day/15 Days Discharge Instructions: Secure with stretch gauze as directed. #1 surprisingly better today 2 continue silver alginate with kerlix. 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 11/22/2020 Medical Record Number:  [id number] Patient Account Number: [id number] Date of Birth/Sex: Treating RN: [birth year] (75 y.o. Tonita Phoenix, Lauren Primary Care Provider: Domingo Mend Other Clinician: Referring Provider: Treating Provider/Extender: Allene Pyo in Treatment: 1 Diagnosis Coding ICD-10 Codes Code Description [phone] Non-pressure chronic ulcer of right calf limited to breakdown of skin I87.301 Chronic venous hypertension (idiopathic) without complications of right lower extremity K50.919 Crohn's disease, unspecified, with unspecified complications Facility Procedures CPT4 Code: [id number] Description: 99213 - WOUND CARE VISIT-LEV 3 EST PT Modifier: Quantity: 1 Physician Procedures : CPT4 Code Description Modifier [id number] 99213 - WC PHYS LEVEL 3 - EST PT ICD-10 Diagnosis Description L97.211 Non-pressure  chronic ulcer of right calf limited to breakdown of skin I87.301 Chronic venous hypertension (idiopathic) without complications of  right lower extremity Quantity: 1 Electronic Signature(s) Signed: 11/23/2020 1:42:03 PM By: [name] MD Entered By: [name] on 11/22/2020 14:56:25

## 2020-11-23 NOTE — Progress Notes (Signed)
Called to room to assist during endoscopic procedure.  Patient ID and intended procedure confirmed with present staff. Received instructions for my participation in the procedure from the performing physician.  

## 2020-11-23 NOTE — Progress Notes (Signed)
PT taken to PACU. Monitors in place. VSS. Report given to RN. 

## 2020-11-23 NOTE — Patient Instructions (Signed)
YOU HAD AN ENDOSCOPIC PROCEDURE TODAY AT THE [facility]:   Refer to the procedure report that was given to you for any specific questions about what was found during the examination.  If the procedure report does not answer your questions, please call your gastroenterologist to clarify.  If you requested that your care partner not be given the details of your procedure findings, then the procedure report has been included in a sealed envelope for you to review at your convenience later.  YOU SHOULD EXPECT: Some feelings of bloating in the abdomen. Passage of more gas than usual.  Walking can help get rid of the air that was put into your GI tract during the procedure and reduce the bloating. If you had a lower endoscopy (such as a colonoscopy or flexible sigmoidoscopy) you may notice spotting of blood in your stool or on the toilet paper. If you underwent a bowel prep for your procedure, you may not have a normal bowel movement for a few days.  Please Note:  You might notice some irritation and congestion in your nose or some drainage.  This is from the oxygen used during your procedure.  There is no need for concern and it should clear up in a day or so.  SYMPTOMS TO REPORT IMMEDIATELY:   Following lower endoscopy (colonoscopy or flexible sigmoidoscopy):  Excessive amounts of blood in the stool  Significant tenderness or worsening of abdominal pains  Swelling of the abdomen that is new, acute  Fever of 100F or higher  For urgent or emergent issues, a gastroenterologist can be reached at any hour by calling [phone]. Do not use MyChart messaging for urgent concerns.    DIET:  We do recommend a small meal at first, but then you may proceed to your regular diet.  Drink plenty of fluids but you should avoid alcoholic beverages for 24 hours.  ACTIVITY:  You should plan to take it easy for the rest of today and you should NOT DRIVE or use heavy machinery until tomorrow (because  of the sedation medicines used during the test).    FOLLOW UP: Our staff will call the number listed on your records 48-72 hours following your procedure to check on you and address any questions or concerns that you may have regarding the information given to you following your procedure. If we do not reach you, we will leave a message.  We will attempt to reach you two times.  During this call, we will ask if you have developed any symptoms of COVID 19. If you develop any symptoms (ie: fever, flu-like symptoms, shortness of breath, cough etc.) before then, please call [phone].  If you test positive for Covid 19 in the 2 weeks post procedure, please call and report this information to us.    If any biopsies were taken you will be contacted by phone or by letter within the next 1-3 weeks.  Please call us at [phone] if you have not heard about the biopsies in 3 weeks.    SIGNATURES/CONFIDENTIALITY: You and/or your care partner have signed paperwork which will be entered into your electronic medical record.  These signatures attest to the fact that that the information above on your After Visit Summary has been reviewed and is understood.  Full responsibility of the confidentiality of this discharge information lies with you and/or your care-partner. 

## 2020-11-23 NOTE — Progress Notes (Signed)
HISTORY OF PRESENT ILLNESS:  [name] is a 75 y.o. female with a history of Crohn's disease diagnosed elsewhere.  She presents today for colonoscopy to evaluate diarrhea, transient rectal bleeding, and elevated fecal calprotectin.  She is on Lialda.  Last seen in the office October 04, 2020.  See that dictation for complete H&P.  No interval changes.  REVIEW OF SYSTEMS:  All non-GI ROS unchanged from previous visit  Past Medical History:  Diagnosis Date   Allergies    Bruises easily    Complication of anesthesia    Crohn's disease ([place])    GI Dr Beulah Gandy in [place], [place]    Deviated septum    GERD (gastroesophageal reflux disease)    Headache    occ    Hiatal hernia    HTN (hypertension)    pcp Dr Margaretann Loveless  in [place], [place]    Kidney disease, chronic, stage III (GFR 30-59 ml/min) ([place])    Neuropathy    fingers,periodically    Osteoarthritis    Pancreatic insufficiency    PONV (postoperative nausea and vomiting)    Rheumatoid arthritis ([place])    Scoliosis    Squamous cell carcinoma of arm    left    Whooping cough 2015   Whooping cough with pneumonia    2017    Past Surgical History:  Procedure Laterality Date   CATARACT EXTRACTION, BILATERAL  2018   with IOC implant    COLONOSCOPY     DEBRIDEMENT AND CLOSURE WOUND Right 10/01/2018   Procedure: Excision of right knee wound with placement of ACell and Pravena;  Surgeon: Wallace Going, DO;  Location: [facility];  Service: Plastics;  Laterality: Right;  30 min   JOINT REPLACEMENT  2020   right knee   knee fracture      NASAL FRACTURE SURGERY     needed b/c she was getting frequent sinus infections    TOTAL KNEE ARTHROPLASTY Right 07/28/2018   Procedure: TOTAL KNEE ARTHROPLASTY;  Surgeon: Paralee Cancel, MD;  Location: [facility];  Service: Orthopedics;  Laterality: Right;   TOTAL KNEE ARTHROPLASTY Left 03/02/2019   Procedure: TOTAL KNEE ARTHROPLASTY;  Surgeon: Paralee Cancel,  MD;  Location: [facility];  Service: Orthopedics;  Laterality: Left;  70 mins   UPPER GASTROINTESTINAL ENDOSCOPY      Social History Alianys I Waldrep  reports that she quit smoking about 32 years ago. Her smoking use included cigarettes. She has a 30.00 pack-year smoking history. She has never used smokeless tobacco. She reports that she does not currently use alcohol. She reports that she does not use drugs.  family history includes Colon cancer in an other family member; Diabetes in her father; Parkinson's disease in her father.  Allergies  Allergen Reactions   Adhesive [Tape]     Redness and skin peeling   Aspirin     Intestinal Bleeding   Ciprofloxacin Nausea And Vomiting   Codeine Nausea And Vomiting   Demerol [Meperidine Hcl] Nausea And Vomiting   Lactose Intolerance (Gi) Other (See Comments)    Pt has Crohn's    Latex     Redness and skin peeling   Other     Nuts-itching in throat  Seeds-stomach issues with chrons     Septra [Sulfamethoxazole-Trimethoprim] Nausea Only       PHYSICAL EXAMINATION:  Vital signs: BP 133/77   Pulse 62   Temp 98 F (36.7 C) (Temporal)   Ht 5' (1.524 m)  Wt 124 lb (56.2 kg)   SpO2 96%   BMI 24.22 kg/m  General: Well-developed, well-nourished, no acute distress HEENT: Sclerae are anicteric, conjunctiva pink. Oral mucosa intact Lungs: Clear Heart: Regular Abdomen: soft, nontender, nondistended, no obvious ascites, no peritoneal signs, normal bowel sounds. No organomegaly. Extremities: No edema Psychiatric: alert and oriented x3. Cooperative     ASSESSMENT:  1.  History of Crohn's colitis.  Recent problems with diarrhea, bleeding, elevated fecal calprotectin.  Now for colonoscopy.  Last colonoscopy elsewhere May 2019   PLAN:  1.  Colonoscopy

## 2020-11-27 ENCOUNTER — Telehealth: Payer: Self-pay | Admitting: *Deleted

## 2020-11-27 ENCOUNTER — Telehealth: Payer: Self-pay

## 2020-11-27 NOTE — Telephone Encounter (Signed)
  Follow up Call-  Call back number 11/23/2020 09/04/2020  Post procedure Call Back phone  # [phone]  Permission to leave phone message Yes Yes  Some recent data might be hidden     Patient questions:  Do you have a fever, pain , or abdominal swelling? No. Pain Score  0 *  Have you tolerated food without any problems? Yes.    Have you been able to return to your normal activities? Yes.    Do you have any questions about your discharge instructions: Diet   No. Medications  No. Follow up visit  No.  Do you have questions or concerns about your Care? No.  Actions: * If pain score is 4 or above: No action needed, pain <4. Have you developed a fever since your procedure? no  2.   Have you had an respiratory symptoms (SOB or cough) since your procedure? no  3.   Have you tested positive for COVID 19 since your procedure no  4.   Have you had any family members/close contacts diagnosed with the COVID 19 since your procedure?  no   If yes to any of these questions please route to Joylene John, RN and Joella Prince, RN

## 2020-11-27 NOTE — Telephone Encounter (Signed)
Attempted f/u phone call. No answer. Left message. °

## 2020-12-04 ENCOUNTER — Encounter: Payer: Self-pay | Admitting: Internal Medicine

## 2020-12-06 ENCOUNTER — Encounter (HOSPITAL_BASED_OUTPATIENT_CLINIC_OR_DEPARTMENT_OTHER): Payer: Medicare Other | Admitting: Internal Medicine

## 2020-12-06 ENCOUNTER — Other Ambulatory Visit: Payer: Self-pay

## 2020-12-06 DIAGNOSIS — L97211 Non-pressure chronic ulcer of right calf limited to breakdown of skin: Secondary | ICD-10-CM | POA: Diagnosis not present

## 2020-12-06 MED ORDER — BUDESONIDE 3 MG PO CPEP: 9.0000 mg | ORAL_CAPSULE | Freq: Every day | ORAL | 2 refills | Status: DC

## 2020-12-06 MED ORDER — COLESTIPOL HCL 1 G PO TABS: 2.0000 g | ORAL_TABLET | Freq: Two times a day (BID) | ORAL | 0 refills | Status: DC

## 2020-12-06 NOTE — Progress Notes (Signed)
April, [name] ([id number]) Visit Report for 12/06/2020 Arrival Information Details Patient Name: Date of Service: [name], [name] 12/06/2020 12:30 PM Medical Record Number: [id number] Patient Account Number: [id number] Date of Birth/Sex: Treating RN: 1945/04/10 (75 y.o. [name] Primary Care Buckley Bradly: Domingo Mend Other Clinician: Referring Virda Betters: Treating Ryman Rathgeber/Extender: Allene Pyo in Treatment: 3 Visit Information History Since Last Visit Added or deleted any medications: No Patient Arrived: Ambulatory Any new allergies or adverse reactions: No Arrival Time: 12:45 Had a fall or experienced change in No Accompanied By: alone activities of daily living that may affect Transfer Assistance: None risk of falls: Patient Identification Verified: Yes Signs or symptoms of abuse/neglect since last visito No Secondary Verification Process Completed: Yes Hospitalized since last visit: No Patient Requires Transmission-Based Precautions: No Implantable device outside of the clinic excluding No Patient Has Alerts: No cellular tissue based products placed in the center since last visit: Has Dressing in Place as Prescribed: Yes Pain Present Now: No Electronic Signature(s) Signed: 12/06/2020 5:07:48 PM By: Levan Hurst RN, BSN Entered By: Levan Hurst on 12/06/2020 12:47:03 -------------------------------------------------------------------------------- Clinic Level of Care Assessment Details Patient Name: Date of Service: [name], [name] I. 12/06/2020 12:30 PM Medical Record Number: [id number] Patient Account Number: [id number] Date of Birth/Sex: Treating RN: [birth year] (75 y.o. [name] Primary Care Lytle Malburg: Domingo Mend Other Clinician: Referring Derhonda Eastlick: Treating Anjolie Majer/Extender: Allene Pyo in Treatment: 3 Clinic Level of Care Assessment Items TOOL 4 Quantity Score X- 1  0 Use when only an EandM is performed on FOLLOW-UP visit ASSESSMENTS - Nursing Assessment / Reassessment X- 1 10 Reassessment of Co-morbidities (includes updates in patient status) X- 1 5 Reassessment of Adherence to Treatment Plan ASSESSMENTS - Wound and Skin A ssessment / Reassessment X - Simple Wound Assessment / Reassessment - one wound 1 5 []  - 0 Complex Wound Assessment / Reassessment - multiple wounds []  - 0 Dermatologic / Skin Assessment (not related to wound area) ASSESSMENTS - Focused Assessment []  - 0 Circumferential Edema Measurements - multi extremities []  - 0 Nutritional Assessment / Counseling / Intervention X- 1 5 Lower Extremity Assessment (monofilament, tuning fork, pulses) []  - 0 Peripheral Arterial Disease Assessment (using hand held doppler) ASSESSMENTS - Ostomy and/or Continence Assessment and Care []  - 0 Incontinence Assessment and Management []  - 0 Ostomy Care Assessment and Management (repouching, etc.) PROCESS - Coordination of Care X - Simple Patient / Family Education for ongoing care 1 15 []  - 0 Complex (extensive) Patient / Family Education for ongoing care X- 1 10 Staff obtains Programmer, systems, Records, T Results / Process Orders est []  - 0 Staff telephones HHA, Nursing Homes / Clarify orders / etc []  - 0 Routine Transfer to another Facility (non-emergent condition) []  - 0 Routine Hospital Admission (non-emergent condition) []  - 0 New Admissions / Biomedical engineer / Ordering NPWT Apligraf, etc. , []  - 0 Emergency Hospital Admission (emergent condition) X- 1 10 Simple Discharge Coordination []  - 0 Complex (extensive) Discharge Coordination PROCESS - Special Needs []  - 0 Pediatric / Minor Patient Management []  - 0 Isolation Patient Management []  - 0 Hearing / Language / Visual special needs []  - 0 Assessment of Community assistance (transportation, D/C planning, etc.) []  - 0 Additional assistance / Altered mentation []  -  0 Support Surface(s) Assessment (bed, cushion, seat, etc.) INTERVENTIONS - Wound Cleansing / Measurement X - Simple Wound Cleansing - one wound 1 5 []  - 0  Complex Wound Cleansing - multiple wounds X- 1 5 Wound Imaging (photographs - any number of wounds) []  - 0 Wound Tracing (instead of photographs) X- 1 5 Simple Wound Measurement - one wound []  - 0 Complex Wound Measurement - multiple wounds INTERVENTIONS - Wound Dressings X - Small Wound Dressing one or multiple wounds 1 10 []  - 0 Medium Wound Dressing one or multiple wounds []  - 0 Large Wound Dressing one or multiple wounds X- 1 5 Application of Medications - topical []  - 0 Application of Medications - injection INTERVENTIONS - Miscellaneous []  - 0 External ear exam []  - 0 Specimen Collection (cultures, biopsies, blood, body fluids, etc.) []  - 0 Specimen(s) / Culture(s) sent or taken to Lab for analysis []  - 0 Patient Transfer (multiple staff / Civil Service fast streamer / Similar devices) []  - 0 Simple Staple / Suture removal (25 or less) []  - 0 Complex Staple / Suture removal (26 or more) []  - 0 Hypo / Hyperglycemic Management (close monitor of Blood Glucose) []  - 0 Ankle / Brachial Index (ABI) - do not check if billed separately X- 1 5 Vital Signs Has the patient been seen at the hospital within the last three years: Yes Total Score: 95 Level Of Care: New/Established - Level 3 Electronic Signature(s) Signed: 12/06/2020 5:07:48 PM By: Levan Hurst RN, BSN Entered By: Levan Hurst on 12/06/2020 13:01:37 -------------------------------------------------------------------------------- Encounter Discharge Information Details Patient Name: Date of Service: [name] I. 12/06/2020 12:30 PM Medical Record Number: [id number] Patient Account Number: [id number] Date of Birth/Sex: Treating RN: October 20, 1945 (75 y.o. [name] Primary Care [name]: Domingo Mend Other Clinician: Referring [name]: Treating  [name]/Extender: Allene Pyo in Treatment: 3 Encounter Discharge Information Items Discharge Condition: Stable Ambulatory Status: Ambulatory Discharge Destination: Home Transportation: Private Auto Accompanied By: alone Schedule Follow-up Appointment: Yes Clinical Summary of Care: Patient Declined Electronic Signature(s) Signed: 12/06/2020 5:07:48 PM By: Levan Hurst RN, BSN Entered By: Levan Hurst on 12/06/2020 13:05:51 -------------------------------------------------------------------------------- Lower Extremity Assessment Details Patient Name: Date of Service: [name] I. 12/06/2020 12:30 PM Medical Record Number: [id number] Patient Account Number: [id number] Date of Birth/Sex: Treating RN: [birth year] (75 y.o. [name] Primary Care Latrease Kunde: Domingo Mend Other Clinician: Referring Shanna Un: Treating Jejuan Scala/Extender: Allene Pyo in Treatment: 3 Edema Assessment Assessed: [Left: No] [Right: No] Edema: [Left: Ye] [Right: s] Calf Left: Right: Point of Measurement: 33 cm From Medial Instep 32 cm Ankle Left: Right: Point of Measurement: 7 cm From Medial Instep 19 cm Vascular Assessment Pulses: Dorsalis Pedis Palpable: [Right:Yes] Electronic Signature(s) Signed: 12/06/2020 5:07:48 PM By: Levan Hurst RN, BSN Entered By: Levan Hurst on 12/06/2020 12:54:28 -------------------------------------------------------------------------------- Multi Wound Chart Details Patient Name: Date of Service: [name] I. 12/06/2020 12:30 PM Medical Record Number: [id number] Patient Account Number: [id number] Date of Birth/Sex: Treating RN: 27-Jul-1945 (75 y.o. F) Primary Care Allean Montfort: Domingo Mend Other Clinician: Referring Sherwood Castilla: Treating Lazarius Rivkin/Extender: Ladean Raya , Magdalen Spatz in Treatment: 3 Vital Signs Height(in): 60 Pulse(bpm): 69 Weight(lbs):  117 Blood Pressure(mmHg): 158/78 Body Mass Index(BMI): 23 Temperature(F): 98.0 Respiratory Rate(breaths/min): 16 Photos: [N/A:N/A] Right, Posterior Lower Leg N/A N/A Wound Location: Gradually Appeared N/A N/A Wounding Event: Venous Leg Ulcer N/A N/A Primary Etiology: Cataracts, Chronic sinus N/A N/A Comorbid History: problems/congestion, Crohns, Osteoarthritis 09/14/2020 N/A N/A Date Acquired: 3 N/A N/A Weeks of Treatment: Open N/A N/A Wound Status: 0.4x0.2x0.1 N/A N/A Measurements L x W x D (cm) 0.063  N/A N/A A (cm) : rea 0.006 N/A N/A Volume (cm) : 99.60% N/A N/A % Reduction in Area: 99.60% N/A N/A % Reduction in Volume: Full Thickness Without Exposed N/A N/A Classification: Support Structures Medium N/A N/A Exudate Amount: Serosanguineous N/A N/A Exudate Type: red, brown N/A N/A Exudate Color: Distinct, outline attached N/A N/A Wound Margin: Large (67-100%) N/A N/A Granulation Amount: Red, Pink N/A N/A Granulation Quality: Small (1-33%) N/A N/A Necrotic Amount: Fat Layer (Subcutaneous Tissue): Yes N/A N/A Exposed Structures: Fascia: No Tendon: No Muscle: No Joint: No Bone: No Large (67-100%) N/A N/A Epithelialization: Treatment Notes Electronic Signature(s) Signed: 12/06/2020 4:53:46 PM By: [name] MD Entered By: [name] on 12/06/2020 13:04:26 -------------------------------------------------------------------------------- Multi-Disciplinary Care Plan Details Patient Name: Date of Service: [name] I. 12/06/2020 12:30 PM Medical Record Number: [id number] Patient Account Number: [id number] Date of Birth/Sex: Treating RN: 03-Apr-1945 (75 y.o. [name] Primary Care Kadrian Partch: Domingo Mend Other Clinician: Referring Abdikadir Fohl: Treating Burnis Halling/Extender: Allene Pyo in Treatment: 3 Multidisciplinary Care Plan reviewed with physician Active Inactive Wound/Skin  Impairment Nursing Diagnoses: Impaired tissue integrity Knowledge deficit related to ulceration/compromised skin integrity Goals: Patient/caregiver will verbalize understanding of skin care regimen Date Initiated: 11/15/2020 Target Resolution Date: 12/15/2020 Goal Status: Active Ulcer/skin breakdown will have a volume reduction of 30% by week 4 Date Initiated: 11/15/2020 Target Resolution Date: 12/15/2020 Goal Status: Active Interventions: Assess patient/caregiver ability to obtain necessary supplies Assess patient/caregiver ability to perform ulcer/skin care regimen upon admission and as needed Assess ulceration(s) every visit Provide education on ulcer and skin care Notes: Electronic Signature(s) Signed: 12/06/2020 5:07:48 PM By: Levan Hurst RN, BSN Entered By: Levan Hurst on 12/06/2020 12:56:04 -------------------------------------------------------------------------------- Pain Assessment Details Patient Name: Date of Service: [name] I. 12/06/2020 12:30 PM Medical Record Number: [id number] Patient Account Number: [id number] Date of Birth/Sex: Treating RN: [birth year] (75 y.o. [name] Primary Care Moishe Schellenberg: Domingo Mend Other Clinician: Referring Shavaun Osterloh: Treating Cassadie Pankonin/Extender: Allene Pyo in Treatment: 3 Active Problems Location of Pain Severity and Description of Pain Patient Has Paino No Site Locations Pain Management and Medication Current Pain Management: Electronic Signature(s) Signed: 12/06/2020 5:07:48 PM By: Levan Hurst RN, BSN Entered By: Levan Hurst on 12/06/2020 12:47:26 -------------------------------------------------------------------------------- Patient/Caregiver Education Details Patient Name: Date of Service: [name] I. 11/30/2022andnbsp12:30 PM Medical Record Number: [id number] Patient Account Number: [id number] Date of Birth/Gender: Treating RN: [birth year] (75 y.o. [name] Primary Care Physician: Domingo Mend Other Clinician: Referring Physician: Treating Physician/Extender: Allene Pyo in Treatment: 3 Education Assessment Education Provided To: Patient Education Topics Provided Wound/Skin Impairment: Methods: Explain/Verbal Responses: State content correctly Electronic Signature(s) Signed: 12/06/2020 5:07:48 PM By: Levan Hurst RN, BSN Entered By: Levan Hurst on 12/06/2020 12:56:25 -------------------------------------------------------------------------------- Wound Assessment Details Patient Name: Date of Service: [name] I. 12/06/2020 12:30 PM Medical Record Number: [id number] Patient Account Number: [id number] Date of Birth/Sex: Treating RN: 12-18-1945 (75 y.o. [name] Primary Care Krizia Flight: Domingo Mend Other Clinician: Referring Anvika Gashi: Treating Magdaline Zollars/Extender: Allene Pyo in Treatment: 3 Wound Status Wound Number: 1 Primary Venous Leg Ulcer Etiology: Wound Location: Right, Posterior Lower Leg Wound Status: Open Wounding Event: Gradually Appeared Comorbid Cataracts, Chronic sinus problems/congestion, Crohns, Date Acquired: 09/14/2020 History: Osteoarthritis Weeks Of Treatment: 3 Clustered Wound: No Photos Wound Measurements Length: (cm) 0.4 Width: (cm) 0.2 Depth: (cm) 0.1 Area: (cm) 0.063 Volume: (cm) 0.006 % Reduction  in Area: 99.6% % Reduction in Volume: 99.6% Epithelialization: Large (67-100%) Tunneling: No Undermining: No Wound Description Classification: Full Thickness Without Exposed Support Structures Wound Margin: Distinct, outline attached Exudate Amount: Medium Exudate Type: Serosanguineous Exudate Color: red, brown Foul Odor After Cleansing: No Slough/Fibrino No Wound Bed Granulation Amount: Large (67-100%) Exposed Structure Granulation Quality: Red, Pink Fascia Exposed: No Necrotic Amount:  Small (1-33%) Fat Layer (Subcutaneous Tissue) Exposed: Yes Necrotic Quality: Adherent Slough Tendon Exposed: No Muscle Exposed: No Joint Exposed: No Bone Exposed: No Treatment Notes Wound #1 (Lower Leg) Wound Laterality: Right, Posterior Cleanser Soap and Water Discharge Instruction: May shower and wash wound with dial antibacterial soap and water prior to dressing change. [employer] Ancillary Kit - 15 Day Supply Discharge Instruction: Use supplies as instructed; Kit contains: (15) Saline Bullets; (15) 3x3 Gauze; 15 pr Gloves Peri-Wound Care Topical Primary Dressing KerraCel Ag Gelling Fiber Dressing, 4x5 in (silver alginate) Discharge Instruction: Apply silver alginate to wound bed as instructed Secondary Dressing Woven Gauze Sponge, Non-Sterile 4x4 in Discharge Instruction: Apply over primary dressing as directed. Secured With Paper Tape, 2x10 (in/yd) Discharge Instruction: Secure dressing with tape as directed. Conforming Stretch Gauze Bandage Roll, Sterile 4x75 (in/in) Discharge Instruction: Secure with stretch gauze as directed. Compression Wrap Compression Stockings Add-Ons Electronic Signature(s) Signed: 12/06/2020 5:07:48 PM By: Levan Hurst RN, BSN Entered By: Levan Hurst on 12/06/2020 12:51:45 -------------------------------------------------------------------------------- Vitals Details Patient Name: Date of Service: [name] I. 12/06/2020 12:30 PM Medical Record Number: [id number] Patient Account Number: [id number] Date of Birth/Sex: Treating RN: Jan 26, 1945 (75 y.o. [name] Primary Care Brooke Payes: Domingo Mend Other Clinician: Referring Maycel Riffe: Treating Alexandar Weisenberger/Extender: Allene Pyo in Treatment: 3 Vital Signs Time Taken: 12:47 Temperature (F): 98.0 Height (in): 60 Pulse (bpm): 76 Weight (lbs): 117 Respiratory Rate (breaths/min): 16 Body Mass Index (BMI): 22.8 Blood Pressure (mmHg):  158/78 Reference Range: 80 - 120 mg / dl Electronic Signature(s) Signed: 12/06/2020 5:07:48 PM By: Levan Hurst RN, BSN Entered By: Levan Hurst on 12/06/2020 12:47:18

## 2020-12-06 NOTE — Progress Notes (Signed)
April, [name] ([id number]) Visit Report for 12/06/2020 HPI Details Patient Name: Date of Service: April, [name] 12/06/2020 12:30 PM Medical Record Number: [id number] Patient Account Number: [id number] Date of Birth/Sex: Treating RN: 1945-07-25 (75 y.o. F) Primary Care Provider: Domingo Mend Other Clinician: Referring Provider: Treating Provider/Extender: Allene Pyo in Treatment: 3 History of Present Illness HPI Description: ADMISSION 11/15/2020 This is a 75 year old woman with a history of squamous cell cancer skin cancer followed by Dr. Anabel Bene dermatology. She describes having a wound on the right posterior calf dating back since September. She saw her primary doctor in early October who suggested petroleum infused gauze. The patient has been doing this. No real improvement in fact she says this is getting larger. She does not complain much of pain. She is completely uncertain about how this happened there was no trauma she simply became aware of it at some point describing it is about the size of a quarter but it has been getting progressively larger since then. She is here for review of this. Past medical history Crohn's disease not currently on prednisone, gastroesophageal reflux disease, history of squamous cell skin cancer, history of rheumatoid arthritis, hypertension 11/16; this lady had a large superficial atypical wound on the right posterior calf. She has significant solar skin damage probably some degree of venous insufficiency but she described a rapidly expanding wound area. Because of this and a history of skin cancer I went ahead and did a shave biopsy but we do not have the results of this at the time we saw her in clinic today. She has been using silver alginate and kerlix 11/30; shave biopsy I did last time did not show any malignancy or fungal elements. Suggestion of a stasis ulcer. With our treatment which is simply been silver  alginate with kerlix that she is changing daily the wound is down dramatically almost surprising how rapidly it is reduced in surface area Electronic Signature(s) Signed: 12/06/2020 4:53:46 PM By: [name] MD Entered By: [name] on 12/06/2020 13:05:18 -------------------------------------------------------------------------------- Physical Exam Details Patient Name: Date of Service: [name] I. 12/06/2020 12:30 PM Medical Record Number: [id number] Patient Account Number: [id number] Date of Birth/Sex: Treating RN: 18-Apr-1945 (75 y.o. F) Primary Care Provider: Domingo Mend Other Clinician: Referring Provider: Treating Provider/Extender: Allene Pyo in Treatment: 3 Constitutional Patient is hypertensive.. Pulse regular and within target range for patient.Marland Kitchen Respirations regular, non-labored and within target range.. Temperature is normal and within the target range for the patient.Marland Kitchen Appears in no distress. Cardiovascular Pedal pulses are palpable on the right. Integumentary (Hair, Skin) She has dry flaky skin on her lower extremities. Probably some degree of solar skin damage and venous insufficiency. Notes Wound exam; right posterior calf. Remarkably better every time we see her. Only a very tiny superficial area remains no debridement is required. Electronic Signature(s) Signed: 12/06/2020 4:53:46 PM By: [name] MD Entered By: [name] on 12/06/2020 13:06:31 -------------------------------------------------------------------------------- Physician Orders Details Patient Name: Date of Service: [name] I. 12/06/2020 12:30 PM Medical Record Number: [id number] Patient Account Number: [id number] Date of Birth/Sex: Treating RN: 01/05/1946 (75 y.o. [name] Primary Care Provider: Domingo Mend Other Clinician: Referring Provider: Treating Provider/Extender: Allene Pyo in Treatment: 3 Verbal / Phone Orders: No Diagnosis Coding ICD-10 Coding Code Description [phone] Non-pressure chronic ulcer of right calf limited to breakdown of skin I87.301 Chronic venous hypertension (idiopathic)  without complications of right lower extremity K50.919 Crohn's disease, unspecified, with unspecified complications Follow-up Appointments ppointment in 2 weeks. - Dr. Dellia Nims Return A Bathing/ Shower/ Hygiene May shower and wash wound with soap and water. - prior to dressing change Wound Treatment Wound #1 - Lower Leg Wound Laterality: Right, Posterior Cleanser: Soap and Water 1 x Per MLJ/44 Days Discharge Instructions: May shower and wash wound with dial antibacterial soap and water prior to dressing change. Cleanser: Byram Ancillary Kit - 15 Day Supply (Generic) 1 x Per Day/15 Days Discharge Instructions: Use supplies as instructed; Kit contains: (15) Saline Bullets; (15) 3x3 Gauze; 15 pr Gloves Prim Dressing: KerraCel Ag Gelling Fiber Dressing, 4x5 in (silver alginate) (Generic) 1 x Per Day/15 Days ary Discharge Instructions: Apply silver alginate to wound bed as instructed Secondary Dressing: Woven Gauze Sponge, Non-Sterile 4x4 in (Generic) 1 x Per Day/15 Days Discharge Instructions: Apply over primary dressing as directed. Secured With: Paper Tape, 2x10 (in/yd) (Generic) 1 x Per Day/15 Days Discharge Instructions: Secure dressing with tape as directed. Secured With: Scientist, forensic, Sterile 4x75 (in/in) (Generic) 1 x Per Day/15 Days Discharge Instructions: Secure with stretch gauze as directed. Electronic Signature(s) Signed: 12/06/2020 4:53:46 PM By: [name] MD Signed: 12/06/2020 5:07:48 PM By: Levan Hurst RN, BSN Entered By: Levan Hurst on 12/06/2020 12:55:47 -------------------------------------------------------------------------------- Problem List Details Patient Name: Date of Service: [name] I.  12/06/2020 12:30 PM Medical Record Number: [id number] Patient Account Number: [id number] Date of Birth/Sex: Treating RN: 1945/09/30 (75 y.o. [name] Primary Care Provider: Domingo Mend Other Clinician: Referring Provider: Treating Provider/Extender: Allene Pyo in Treatment: 3 Active Problems ICD-10 Encounter Code Description Active Date MDM Diagnosis L97.211 Non-pressure chronic ulcer of right calf limited to breakdown of skin 11/15/2020 No Yes I87.301 Chronic venous hypertension (idiopathic) without complications of right lower 11/15/2020 No Yes extremity K50.919 Crohn's disease, unspecified, with unspecified complications 19/07/5881 No Yes Inactive Problems Resolved Problems Electronic Signature(s) Signed: 12/06/2020 4:53:46 PM By: [name] MD Entered By: [name] on 12/06/2020 13:04:19 -------------------------------------------------------------------------------- Progress Note Details Patient Name: Date of Service: [name] I. 12/06/2020 12:30 PM Medical Record Number: [id number] Patient Account Number: [id number] Date of Birth/Sex: Treating RN: 08-Feb-1945 (75 y.o. F) Primary Care Provider: Domingo Mend Other Clinician: Referring Provider: Treating Provider/Extender: Ladean Raya , Magdalen Spatz in Treatment: 3 Subjective History of Present Illness (HPI) ADMISSION 11/15/2020 This is a 75 year old woman with a history of squamous cell cancer skin cancer followed by Dr. Anabel Bene dermatology. She describes having a wound on the right posterior calf dating back since September. She saw her primary doctor in early October who suggested petroleum infused gauze. The patient has been doing this. No real improvement in fact she says this is getting larger. She does not complain much of pain. She is completely uncertain about how this happened there was no trauma she simply became aware of it at  some point describing it is about the size of a quarter but it has been getting progressively larger since then. She is here for review of this. Past medical history Crohn's disease not currently on prednisone, gastroesophageal reflux disease, history of squamous cell skin cancer, history of rheumatoid arthritis, hypertension 11/16; this lady had a large superficial atypical wound on the right posterior calf. She has significant solar skin damage probably some degree of venous insufficiency but she described a rapidly expanding wound area. Because of this and a history  of skin cancer I went ahead and did a shave biopsy but we do not have the results of this at the time we saw her in clinic today. She has been using silver alginate and kerlix 11/30; shave biopsy I did last time did not show any malignancy or fungal elements. Suggestion of a stasis ulcer. With our treatment which is simply been silver alginate with kerlix that she is changing daily the wound is down dramatically almost surprising how rapidly it is reduced in surface area Objective Constitutional Patient is hypertensive.. Pulse regular and within target range for patient.Marland Kitchen Respirations regular, non-labored and within target range.. Temperature is normal and within the target range for the patient.Marland Kitchen Appears in no distress. Vitals Time Taken: 12:47 PM, Height: 60 in, Weight: 117 lbs, BMI: 22.8, Temperature: 98.0 F, Pulse: 76 bpm, Respiratory Rate: 16 breaths/min, Blood Pressure: 158/78 mmHg. Cardiovascular Pedal pulses are palpable on the right. General Notes: Wound exam; right posterior calf. Remarkably better every time we see her. Only a very tiny superficial area remains no debridement is required. Integumentary (Hair, Skin) She has dry flaky skin on her lower extremities. Probably some degree of solar skin damage and venous insufficiency. Wound #1 status is Open. Original cause of wound was Gradually Appeared. The date  acquired was: 09/14/2020. The wound has been in treatment 3 weeks. The wound is located on the Right,Posterior Lower Leg. The wound measures 0.4cm length x 0.2cm width x 0.1cm depth; 0.063cm^2 area and 0.006cm^3 volume. There is Fat Layer (Subcutaneous Tissue) exposed. There is no tunneling or undermining noted. There is a medium amount of serosanguineous drainage noted. The wound margin is distinct with the outline attached to the wound base. There is large (67-100%) red, pink granulation within the wound bed. There is a small (1-33%) amount of necrotic tissue within the wound bed including Adherent Slough. 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 - Dr. Dellia Nims Bathing/ Shower/ Hygiene: May shower and wash wound with soap and water. - prior to dressing change WOUND #1: - Lower Leg Wound Laterality: Right, Posterior Cleanser: Soap and Water 1 x Per VZD/63 Days Discharge Instructions: May shower and wash wound with dial antibacterial soap and water prior to dressing change. Cleanser: Byram Ancillary Kit - 15 Day Supply (Generic) 1 x Per Day/15 Days Discharge Instructions: Use supplies as instructed; Kit contains: (15) Saline Bullets; (15) 3x3 Gauze; 15 pr Gloves Prim Dressing: KerraCel Ag Gelling Fiber Dressing, 4x5 in (silver alginate) (Generic) 1 x Per Day/15 Days ary Discharge Instructions: Apply silver alginate to wound bed as instructed Secondary Dressing: Woven Gauze Sponge, Non-Sterile 4x4 in (Generic) 1 x Per Day/15 Days Discharge Instructions: Apply over primary dressing as directed. Secured With: Paper Tape, 2x10 (in/yd) (Generic) 1 x Per Day/15 Days Discharge Instructions: Secure dressing with tape as  directed. Secured With: Scientist, forensic, Sterile 4x75 (in/in) (Generic) 1 x Per Day/15 Days Discharge Instructions: Secure with stretch gauze as directed. 1. Remarkable improvement 2. Shave biopsy showed no malignancy even though she has a history of this 3. Still using silver alginate with kerlix. The wound has remarkably improved 4. Should be healed the next time she is here. I have suggested using moisturizer twice a day to her skin and consideration of support hose Electronic Signature(s) Signed: 12/06/2020 4:53:46 PM  By: [name] MD Entered By: [name] on 12/06/2020 13:07:41 -------------------------------------------------------------------------------- SuperBill Details Patient Name: Date of Service: [name] I. 12/06/2020 Medical Record Number: [id number] Patient Account Number: [id number] Date of Birth/Sex: Treating RN: August 06, 1945 (75 y.o. [name] Primary Care Provider: Domingo Mend Other Clinician: Referring Provider: Treating Provider/Extender: Allene Pyo in Treatment: 3 Diagnosis Coding ICD-10 Codes Code Description [phone] Non-pressure chronic ulcer of right calf limited to breakdown of skin I87.301 Chronic venous hypertension (idiopathic) without complications of right lower extremity K50.919 Crohn's disease, unspecified, with unspecified complications Facility Procedures CPT4 Code: [id number] Description: 99213 - WOUND CARE VISIT-LEV 3 EST PT Modifier: Quantity: 1 Physician Procedures : CPT4 Code Description Modifier [id number] 99213 - WC PHYS LEVEL 3 - EST PT ICD-10 Diagnosis Description L97.211 Non-pressure chronic ulcer of right calf limited to breakdown of skin I87.301 Chronic venous hypertension (idiopathic) without complications of  right lower extremity K50.919 Crohn's disease, unspecified, with unspecified complications Quantity: 1 Electronic Signature(s) Signed:  12/06/2020 4:53:46 PM By: [name] MD Entered By: [name] on 12/06/2020 13:07:59

## 2020-12-12 ENCOUNTER — Telehealth: Payer: Self-pay | Admitting: Pharmacist

## 2020-12-12 NOTE — Chronic Care Management (AMB) (Signed)
Chronic Care Management Pharmacy Assistant   Name: NEIDA ELLEGOOD  MRN: [id number] DOB: 05/10/1945  [name] is an 75 y.o. year old female who presents for his initial CCM visit with the clinical pharmacist.  Reason for Encounter: Chart prep for initial visit with Jeni Salles the clinical pharmacist on 12/19/2020   Conditions to be addressed/monitored: HLD and CKD Stage 3  Recent office visits:  10/26/2020 Lelon Frohlich MD - Patient was seen for wound of right lower extremity. Referral to Wound Clinic. No medication changes. No follow up noted.  10/13/2020 Estela Isaac Bliss MD - Patient was seen for wound of right lower extremity.  No medication changes. No follow up noted.  09/19/2020 Estela Isaac Bliss MD - Patient was seen for medicare annual wellness exam and additional issues. No medication changes. Follow up in 6 months.  Recent consult visits:  12/06/2020 [name] MD (internal medicine) - Patient was seen for wound care. No medication changes. No follow up noted.  11/22/2020 [name] MD (internal medicine) - Patient was seen for wound care. No medication changes. No follow up noted.  11/15/2020 [name] MD (internal medicine) - Patient was seen for wound care. No medication changes. No follow up noted.  10/04/2020 [name] MD (GI) - Patient was seen for diarrhea and additional issues. Started MoviPrep140 mg  1 kit once. Increased Omeprazole 40 mg to 3 times daily. Discontinued Hydrocodone Acetaminophen and Rifaximin. No follow up noted.  08/21/2020 Patient was seen for diarrhea and additional issues. Started Colestipol 2 GM twice daily. No follow up noted  Hospital visits:  None  Medications: Outpatient Encounter Medications as of 12/12/2020  Medication Sig   budesonide (ENTOCORT EC) 3 MG 24 hr capsule Take 3 capsules (9 mg total) by mouth daily.   Calcium Citrate-Vitamin D (CALCIUM + D PO) Take 1 tablet by mouth 2 (two)  times daily.   cetirizine (ZYRTEC) 10 MG tablet Take 1 tablet (10 mg total) by mouth daily.   colestipol (COLESTID) 1 g tablet Take 2 tablets (2 g total) by mouth 2 (two) times daily. Colonoscopy scheduled for 11-17   dicyclomine (BENTYL) 20 MG tablet Take 1 tablet (20 mg total) by mouth 4 (four) times daily as needed for spasms.   DULoxetine (CYMBALTA) 60 MG capsule Take 1 capsule (60 mg total) by mouth 2 (two) times daily.   fluticasone (FLONASE) 50 MCG/ACT nasal spray Place 2 sprays into both nostrils daily.   loperamide (IMODIUM) 2 MG capsule Take 1 capsule (2 mg total) by mouth 3 (three) times daily as needed for diarrhea or loose stools.   losartan (COZAAR) 100 MG tablet Take 1 tablet (100 mg total) by mouth daily.   mesalamine (LIALDA) 1.2 g EC tablet Take 4 tablets (4.8 g total) by mouth daily with breakfast.   Misc Natural Product Nasal (NASAL CLEANSE RINSE MIX NA) Place 1 Dose into the nose daily as needed (congestion).   Multiple Vitamin (MULTIVITAMIN WITH MINERALS) TABS tablet Take 1 tablet by mouth daily.   omeprazole (PRILOSEC) 40 MG capsule Take 1 capsule (40 mg total) by mouth in the morning, at noon, and at bedtime.   triamcinolone cream (KENALOG) 0.1 % APPLY TO AFFECTED AREA TWICE A DAY   No facility-administered encounter medications on file as of 12/12/2020.  Fill History: BUDESONIDE EC 3 MG CAPSULE 12/06/2020 90   CETIRIZINE HCL 10 MG TABLET 09/19/2020 90   COLESTIPOL HCL 1 GM TABLET 11/18/2020 30  DULOXETINE HCL DR 60 MG CAP 10/17/2020 90   DICYCLOMINE 20 MG TABLET 11/17/2020 90   FLUTICASONE PROPIONATE  50 MCG/ACT SUSP 12/09/2020 90   LOPERAMIDE 2 MG CAPSULE 10/18/2020 60   LOSARTAN POTASSIUM 100 MG TAB 11/21/2020 90   MESALAMINE DR 1.2 GM TABLET 11/09/2020 90   OMEPRAZOLE DR 40 MG CAPSULE 11/03/2020 90   TRIAMCINOLONE 0.1% CREAM 11/12/2020 90   Have you seen any other providers since your last visit? **Yes, [facility]  Any changes in your  medications or health? Yes, GI started her on Budesonide.   Any side effects from any medications? no  Do you have an symptoms or problems not managed by your medications? no  Any concerns about your health right now? no  Has your provider asked that you check blood pressure, blood sugar, or follow special diet at home? No  Do you get any type of exercise on a regular basis? Yes, walking 3 times per week  Can you think of a goal you would like to reach for your health? To be able to eat normal and not have GI pain.  Do you have any problems getting your medications? no  Is there anything that you would like to discuss during the appointment? Patient thinks she may have Raynauds  Please bring medications and supplements to appointment  Care Gaps: AWV - 09/19/2020 Last BP - 130/90 on 10/26/2020 Hep C Screen - never done  Star Rating Drugs: Losartan 100 mg - last filled 11/21/2020 90 DS at [place] Pharmacist Assistant [phone]

## 2020-12-19 ENCOUNTER — Ambulatory Visit (INDEPENDENT_AMBULATORY_CARE_PROVIDER_SITE_OTHER): Payer: Medicare Other | Admitting: Pharmacist

## 2020-12-19 DIAGNOSIS — I10 Essential (primary) hypertension: Secondary | ICD-10-CM

## 2020-12-19 DIAGNOSIS — E785 Hyperlipidemia, unspecified: Secondary | ICD-10-CM

## 2020-12-19 NOTE — Progress Notes (Signed)
Chronic Care Management Pharmacy Note  01/07/2021 Name:  [name] MRN:  [id number] DOB:  Oct 25, 1945  Summary: BP is at goal < 140/90  Recommendations/Changes made from today's visit: -Recommended against taking omeprazole three times daily due to risks for side effects -Recommended bringing BP cuff to next office visit to ensure accuracy -Recommended repeat DEXA -Recommended taper of duloxetine based on patient preference  Plan: BP assessment in 2-3 months  Subjective: [name] is an 75 y.o. year old female who is a primary patient of [name], April Halsted, MD.  The CCM team was consulted for assistance with disease management and care coordination needs.    Engaged with patient face to face for initial visit in response to provider referral for pharmacy case management and/or care coordination services.   Consent to Services:  The patient was given the following information about Chronic Care Management services today, agreed to services, and gave verbal consent: 1. CCM service includes personalized support from designated clinical staff supervised by the primary care provider, including individualized plan of care and coordination with other care providers 2. 24/7 contact phone numbers for assistance for urgent and routine care needs. 3. Service will only be billed when office clinical staff spend 20 minutes or more in a month to coordinate care. 4. Only one practitioner may furnish and bill the service in a calendar month. 5.The patient may stop CCM services at any time (effective at the end of the month) by phone call to the office staff. 6. The patient will be responsible for cost sharing (co-pay) of up to 20% of the service fee (after annual deductible is met). Patient agreed to services and consent obtained.  Patient Care Team: [name], April Halsted, MD as PCP - General (Internal Medicine) [name], [facility] as Pharmacist (Pharmacist)  Recent office  visits: 10/26/2020 April Frohlich MD - Patient was seen for wound of right lower extremity. Referral to Wound Clinic. No medication changes. No follow up noted.   10/13/2020 April April Bliss MD - Patient was seen for wound of right lower extremity.  No medication changes. No follow up noted.   09/19/2020 April April Bliss MD - Patient was seen for medicare annual wellness exam and additional issues. No medication changes. Follow up in 6 months.  Recent consult visits: 12/06/2020 April Ham MD (internal medicine) - Patient was seen for wound care. No medication changes. No follow up noted.   11/22/2020 April Ham MD (internal medicine) - Patient was seen for wound care. No medication changes. No follow up noted.   11/15/2020 April Ham MD (internal medicine) - Patient was seen for wound care. No medication changes. No follow up noted.   10/04/2020 April Shorts MD (GI) - Patient was seen for diarrhea and additional issues. Started MoviPrep140 mg  1 kit once. Increased Omeprazole 40 mg to 3 times daily. Discontinued Hydrocodone Acetaminophen and Rifaximin. No follow up noted.   08/21/2020 Patient was seen for diarrhea and additional issues. Started Colestipol 2 GM twice daily. No follow up noted.  Hospital visits: None in previous 6 months   Objective:  Lab Results  Component Value Date   CREATININE 1.25 (H) 09/19/2020   BUN 35 (H) 09/19/2020   GFR 42.24 (L) 09/19/2020   GFRNONAA >60 03/04/2019   GFRAA >60 03/04/2019   NA 137 09/19/2020   K 3.9 09/19/2020   CALCIUM 9.1 09/19/2020   CO2 22 09/19/2020   GLUCOSE 87 09/19/2020  Lab Results  Component Value Date/Time   HGBA1C 5.8 02/16/2019 03:34 PM   GFR 42.24 (L) 09/19/2020 11:36 AM   GFR 50.92 (L) 09/07/2020 03:46 PM    Last diabetic Eye exam: No results found for: HMDIABEYEEXA  Last diabetic Foot exam: No results found for: HMDIABFOOTEX   Lab Results  Component Value Date   CHOL 142  09/19/2020   HDL 45.20 09/19/2020   LDLCALC 74 09/19/2020   TRIG 115.0 09/19/2020   CHOLHDL 3 09/19/2020    Hepatic Function Latest Ref Rng & Units 09/19/2020 09/07/2020 09/16/2019  Total Protein 6.0 - 8.3 g/dL 7.2 7.2 7.4  Albumin 3.5 - 5.2 g/dL 3.7 3.7 -  AST 0 - 37 U/L _0 ALT 0 - 35 U/L _1 Alk Phosphatase 39 - 117 U/L 87 92 -  Total Bilirubin 0.2 - 1.2 mg/dL 0.4 0.3 0.5    Lab Results  Component Value Date/Time   TSH 1.80 03/22/2020 11:02 AM    CBC Latest Ref Rng & Units 09/19/2020 09/07/2020 09/16/2019  WBC 4.0 - 10.5 K/uL 11.7(H) 11.2(H) 8.0  Hemoglobin 12.0 - 15.0 g/dL 11.6(L) 11.7(L) 13.5  Hematocrit 36.0 - 46.0 % 35.4(L) 35.0(L) 40.9  Platelets 150.0 - 400.0 K/uL 458.0(H) 404.0(H) 363    No results found for: VD25OH  Clinical ASCVD: No  The ASCVD Risk score (Arnett DK, et al., 2019) failed to calculate for the following reasons:   Unable to determine if patient is Non-Hispanic African American    Depression screen [facility] 2/9 09/19/2020 03/15/2020 09/16/2019  Decreased Interest 0 0 0  Down, Depressed, Hopeless 0 0 0  PHQ - 2 Score 0 0 0  Altered sleeping 1 0 0  Tired, decreased energy 0 1 0  Change in appetite 0 0 0  Feeling bad or failure about yourself  0 0 0  Trouble concentrating 0 0 0  Moving slowly or fidgety/restless 0 0 0  Suicidal thoughts 0 0 0  PHQ-9 Score 1 1 0  Difficult doing work/chores Not difficult at all Not difficult at all Not difficult at all      Social History   Tobacco Use  Smoking Status Former   Packs/day: 1.00   Years: 30.00   Pack years: 30.00   Types: Cigarettes   Quit date: 1990   Years since quitting: 33.0  Smokeless Tobacco Never   BP Readings from Last 3 Encounters:  11/23/20 112/69  10/26/20 130/90  10/13/20 120/78   Pulse Readings from Last 3 Encounters:  11/23/20 79  10/04/20 73  09/04/20 71   Wt Readings from Last 3 Encounters:  11/23/20 124 lb (56.2 kg)  10/26/20 121 lb 14.4 oz (55.3 kg)  10/13/20 121  lb 14.4 oz (55.3 kg)   BMI Readings from Last 3 Encounters:  11/23/20 24.22 kg/m  10/26/20 23.81 kg/m  10/13/20 23.81 kg/m    Assessment/Interventions: Review of patient past medical history, allergies, medications, health status, including review of consultants reports, laboratory and other test data, was performed as part of comprehensive evaluation and provision of chronic care management services.   SDOH:  (Social Determinants of Health) assessments and interventions performed: Yes SDOH Interventions    Flowsheet Row Most Recent Value  SDOH Interventions   Financial Strain Interventions Intervention Not Indicated  Transportation Interventions Intervention Not Indicated      SDOH Screenings   Alcohol Screen: Not on file  Depression (PHQ2-9): Low Risk    PHQ-2 Score: 1  Financial  Resource Strain: Low Risk    Difficulty of Paying Living Expenses: Not very hard  Food Insecurity: Not on file  Housing: Not on file  Physical Activity: Not on file  Social Connections: Not on file  Stress: Not on file  Tobacco Use: Medium Risk   Smoking Tobacco Use: Former   Smokeless Tobacco Use: Never   Passive Exposure: Not on file  Transportation Needs: No Transportation Needs   Lack of Transportation (Medical): No   Lack of Transportation (Non-Medical): No   Patient currently lives with her husband and is spending a lot of her time lately preparing for Christmas. She has some children and grandchildren. They are spread out around [place] (1 granddaughter), [place] (2 grandchildren) and [place] (1 granddaughter). She doesn't see the one in [place] or in [place] very often but did just go down to [place] for a Christmas concert of theirs.  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   Patient has been walking a few times a week for half an hour to an hour and she has been doing for awhile.  She notes  some concerns about possibly having Raynauds. She has never mentioned this to anyone before but plans to discuss with her PCP the next time she sees her.  Patient does not always sleep very well as she usually wakes up sometime during the night because of her Crohns. She doesn't want to do surgery for hernia  and this sometimes wakes her up. Usually when the pain hits it takes hours to go away. But she does report she feels rested for the most part and doesn't take naps.  Patient denies any problems with her medications and feels she know what her medications do.   CCM Care Plan  Allergies  Allergen Reactions   Adhesive [Tape]     Redness and skin peeling   Aspirin     Intestinal Bleeding   Ciprofloxacin Nausea And Vomiting   Codeine Nausea And Vomiting   Demerol [Meperidine Hcl] Nausea And Vomiting   Lactose Intolerance (Gi) Other (See Comments)    Pt has Crohn's    Latex     Redness and skin peeling   Other     Nuts-itching in throat  Seeds-stomach issues with chrons     Septra [Sulfamethoxazole-Trimethoprim] Nausea Only    Medications Reviewed Today     Reviewed by , Madeline G, RPH (Pharmacist) on 12/19/20 at 0942  Med List Status: <None>   Medication Order Taking? Sig Documenting Provider Last Dose Status Informant  budesonide (ENTOCORT EC) 3 MG 24 hr capsule [id number] Yes Take 3 capsules (9 mg total) by mouth daily. Perry, John N, MD Taking Active   Calcium Citrate-Vitamin D (CALCIUM + D PO) [id number] Yes Take 1 tablet by mouth 2 (two) times daily. [provider] Taking Active Self  cetirizine (ZYRTEC) 10 MG tablet [id number] Yes Take 1 tablet (10 mg total) by mouth daily. Hernandez Acosta, April Y, MD Taking Active   colestipol (COLESTID) 1 g tablet [id number] Yes Take 2 tablets (2 g total) by mouth 2 (two) times daily. Colonoscopy scheduled for 11-17  Patient taking differently: Take 2 g by mouth daily. Colonoscopy scheduled for 11-17   Perry, John N, MD  Taking Active   dicyclomine (BENTYL) 20 MG tablet [id number] Yes Take 1 tablet (20 mg total) by mouth 4 (four) times daily as needed for spasms. Perry, John N,   MD Taking Active   DULoxetine (CYMBALTA) 60 MG capsule [id number] Yes Take 1 capsule (60 mg total) by mouth 2 (two) times daily. [name], April Halsted, MD Taking Active   fluticasone [facility]) 50 MCG/ACT nasal spray [id number] Yes Place 2 sprays into both nostrils daily. [name], April Halsted, MD Taking Active   loperamide (IMODIUM) 2 MG capsule [id number] Yes Take 1 capsule (2 mg total) by mouth 3 (three) times daily as needed for diarrhea or loose stools. Irene Shipper, MD Taking Active   losartan (COZAAR) 100 MG tablet [id number] Yes Take 1 tablet (100 mg total) by mouth daily. [name], April Halsted, MD Taking Active   mesalamine (LIALDA) 1.2 g EC tablet [id number] Yes Take 4 tablets (4.8 g total) by mouth daily with breakfast. Irene Shipper, MD Taking Active   Misc Natural Product Nasal (NASAL CLEANSE RINSE MIX NA) [id number] Yes Place 1 Dose into the nose daily as needed (congestion). [provider] Taking Active Self  Multiple Vitamin (MULTIVITAMIN WITH MINERALS) TABS tablet [id number] Yes Take 1 tablet by mouth daily. [provider] Taking Active Self  omeprazole (PRILOSEC) 40 MG capsule [id number] Yes Take 1 capsule (40 mg total) by mouth in the morning, at noon, and at bedtime. 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/02/2019   Open wound of right knee 09/18/2018   Overweight (BMI 25.0-29.9) 07/29/2018   S/P right TKA 07/28/2018    Immunization History  Administered Date(s) Administered   Moderna Sars-Covid-2 Vaccination 03/29/2019, 04/26/2019   PFIZER(Purple Top)SARS-COV-2 Vaccination 11/08/2019    Pneumococcal Conjugate-13 09/19/2020   Pneumococcal Polysaccharide-23 09/16/2019    Conditions to be addressed/monitored:  Hypertension, Hyperlipidemia, GERD, Chronic Kidney Disease, Depression, and Allergic Rhinitis  Care Plan : [place]  Updates made by [name], [place] since 01/07/2021 12:00 AM     Problem: Problem: Hypertension, Hyperlipidemia, GERD, Chronic Kidney Disease, Depression, and Allergic Rhinitis      Long-Range Goal: Patient-Specific Goal   Start Date: 12/19/2020  Expected End Date: 12/19/2021  This Visit's Progress: On track  Priority: High  Note:   Current Barriers:  Unable to independently monitor therapeutic efficacy  Pharmacist Clinical Goal(s):  Patient will achieve adherence to monitoring guidelines and medication adherence to achieve therapeutic efficacy through collaboration with PharmD and provider.   Interventions: 1:1 collaboration with [name], April Halsted, MD regarding development and update of comprehensive plan of care as evidenced by provider attestation and co-signature Inter-disciplinary care team collaboration (see longitudinal plan of care) Comprehensive medication review performed; medication list updated in electronic medical record  Hypertension (BP goal <140/90) -Controlled -Current treatment: Losartan 100 mg 1 tablet daily -Medications previously tried: n/a  -Current home readings: (checking occasionally - wrist cuff) - not brought into office  -Current dietary habits: pays attention to salt intake; eats out once a week [employer]); does look at sugar and sodium content - sometimes frozen meals and canned vegetables  -Current exercise habits: walking 3 days a week -Denies hypotensive/hypertensive symptoms -Educated on BP goals and benefits of medications for prevention of heart attack, stroke and kidney damage; Importance of home blood pressure monitoring; Proper BP monitoring technique; Symptoms of  hypotension and importance of maintaining adequate hydration; -  Counseled to monitor BP at home weekly, document, and provide log at future appointments -Counseled on diet and exercise extensively Recommended to continue current medication Recommended checking BP when having headaches.  Depression (Goal: minimize symptoms) -Controlled -Current treatment: Duloxetine 60 mg 1 capsule twice daily -Medications previously tried/failed: n/a -PHQ9: 1 -GAD7: n/a -Educated on Benefits of medication for symptom control -Collaborated with PCP to consider taper as patient isn't sure if she needs this anymore.  GERD/GI (Goal: minimize symptoms) -Not ideally controlled -Current treatment  Omeprazole 40 mg three times daily  Budesonide 3 mg 3 capsules daily Colestipol 1 g 2 tablets twice daily Loperamide 2 mg 1 capsule three times daily as needed - twice a day Dicyclomine 20 mg 1 tablet 4 times daily as needed (using every day) Mesalamine 4 tablets with breakfast -Medications previously tried: Nexium, colestipol (constipation)  -Counseled on benefits of visit with nutritionist but patient declined. Recommended against taking omeprazole three times daily due to risks for side effects and worsening of diarrhea.  Allergic rhinitis (Goal: minimize symptoms) -Controlled -Current treatment  Cetirizine 10 mg 1 tablet daily Fluticasone 50 mcg/act 2 sprays in both nostrils daily Saline nasal spray as needed (nasal cleanse) -Medications previously tried: none  -Recommended to continue current medication  Bone health (Goal prevent fractures) -Not ideally controlled -Last DEXA Scan: n/a   T-Score femoral neck: n/a  T-Score total hip: n/a  T-Score lumbar spine: n/a  T-Score forearm radius: n/a  10-year probability of major osteoporotic fracture: n/a  10-year probability of hip fracture: n/a -Patient is not a candidate for pharmacologic treatment -Current treatment  Calcium-vitamin D 600 mg-25 mcg  twice daily (total for the day) -Medications previously tried: Fosamax (failed)  -Recommend [phone] units of vitamin D daily. Recommend 1200 mg of calcium daily from dietary and supplemental sources. Recommend weight-bearing and muscle strengthening exercises for building and maintaining bone density. -Counseled on diet and exercise extensively Recommended repeat DEXA.   Health Maintenance -Vaccine gaps:  shingles, influenza (egg allergy), tetanus, COVID booster  -Current therapy:  Multivitamin daily Triamcinolone cream 0.1% as needed -Educated on Cost vs benefit of each product must be carefully weighed by individual consumer -Patient is satisfied with current therapy and denies issues -Recommended to continue current medication  Patient Goals/Self-Care Activities Patient will:  - take medications as prescribed as evidenced by patient report and record review check blood pressure weekly, document, and provide at future appointments target a minimum of 150 minutes of moderate intensity exercise weekly  Follow Up Plan: Telephone follow up appointment with care management team member scheduled for:       Medication Assistance: None required.  Patient affirms current coverage meets needs.  Compliance/Adherence/Medication fill history: Care Gaps: Hep C screening Last BP - 130/90 on 10/26/2020  [facility]: Losartan 100 mg - last [facility]  Patient's preferred pharmacy is:  [id number] [place]Phone: [phone]: [address] [place]Phone: [phone]: [phone] Uses pill box? No - only uses one when traveling Pt endorses 99% compliance - only if traveling  We discussed: Benefits of medication synchronization, packaging and delivery as well as enhanced  pharmacist oversight with [employer]. Patient decided to: Continue current medication management strategy  Care Plan and Follow Up Patient Decision:  Patient agrees to Care Plan and  Follow-up.  Plan: Telephone follow up appointment with care management team member scheduled for:  6 months  Madeline , PharmD, BCACP Clinical Pharmacist [facility] [phone]       

## 2020-12-20 ENCOUNTER — Encounter (HOSPITAL_BASED_OUTPATIENT_CLINIC_OR_DEPARTMENT_OTHER): Payer: Medicare Other | Attending: Internal Medicine | Admitting: Internal Medicine

## 2020-12-20 ENCOUNTER — Other Ambulatory Visit: Payer: Self-pay

## 2020-12-20 DIAGNOSIS — I87301 Chronic venous hypertension (idiopathic) without complications of right lower extremity: Secondary | ICD-10-CM | POA: Diagnosis not present

## 2020-12-20 DIAGNOSIS — Z09 Encounter for follow-up examination after completed treatment for conditions other than malignant neoplasm: Secondary | ICD-10-CM | POA: Diagnosis present

## 2020-12-20 DIAGNOSIS — Z7952 Long term (current) use of systemic steroids: Secondary | ICD-10-CM | POA: Insufficient documentation

## 2020-12-20 DIAGNOSIS — L578 Other skin changes due to chronic exposure to nonionizing radiation: Secondary | ICD-10-CM | POA: Insufficient documentation

## 2020-12-20 DIAGNOSIS — Z872 Personal history of diseases of the skin and subcutaneous tissue: Secondary | ICD-10-CM | POA: Diagnosis not present

## 2020-12-20 DIAGNOSIS — Z85828 Personal history of other malignant neoplasm of skin: Secondary | ICD-10-CM | POA: Insufficient documentation

## 2020-12-22 NOTE — Progress Notes (Signed)
April, [name] ([id number]) Visit Report for 12/20/2020 Arrival Information Details Patient Name: Date of Service: [name], [name] 12/20/2020 12:45 PM Medical Record Number: [id number] Patient Account Number: [id number] Date of Birth/Sex: Treating RN: 13-Mar-1945 (75 y.o. Tonita Phoenix, Lauren Primary Care Aidaly Cordner: Domingo Mend Other Clinician: Referring Devinn Voshell: Treating Bailee Metter/Extender: Allene Pyo in Treatment: 5 Visit Information History Since Last Visit Added or deleted any medications: No Patient Arrived: Ambulatory Any new allergies or adverse reactions: No Arrival Time: 12:48 Had a fall or experienced change in No Accompanied By: self activities of daily living that may affect Transfer Assistance: None risk of falls: Patient Identification Verified: Yes Signs or symptoms of abuse/neglect since last visito No Secondary Verification Process Completed: Yes Hospitalized since last visit: No Patient Requires Transmission-Based Precautions: No Implantable device outside of the clinic excluding No Patient Has Alerts: No cellular tissue based products placed in the center since last visit: Has Dressing in Place as Prescribed: Yes Pain Present Now: No Electronic Signature(s) Signed: 12/22/2020 11:40:20 AM By: Rhae Hammock RN Entered By: Rhae Hammock on 12/20/2020 12:48:27 -------------------------------------------------------------------------------- Clinic Level of Care Assessment Details Patient Name: Date of Service: [name], [name] I. 12/20/2020 12:45 PM Medical Record Number: [id number] Patient Account Number: [id number] Date of Birth/Sex: Treating RN: 11/05/1945 (75 y.o. Tonita Phoenix, Lauren Primary Care Keah Lamba: Domingo Mend Other Clinician: Referring Buddy Loeffelholz: Treating Demetres Prochnow/Extender: Allene Pyo in Treatment: 5 Clinic Level of Care Assessment Items TOOL 4 Quantity Score X-  1 0 Use when only an EandM is performed on FOLLOW-UP visit ASSESSMENTS - Nursing Assessment / Reassessment X- 1 10 Reassessment of Co-morbidities (includes updates in patient status) X- 1 5 Reassessment of Adherence to Treatment Plan ASSESSMENTS - Wound and Skin A ssessment / Reassessment X - Simple Wound Assessment / Reassessment - one wound 1 5 []  - 0 Complex Wound Assessment / Reassessment - multiple wounds []  - 0 Dermatologic / Skin Assessment (not related to wound area) ASSESSMENTS - Focused Assessment X- 1 5 Circumferential Edema Measurements - multi extremities []  - 0 Nutritional Assessment / Counseling / Intervention []  - 0 Lower Extremity Assessment (monofilament, tuning fork, pulses) []  - 0 Peripheral Arterial Disease Assessment (using hand held doppler) ASSESSMENTS - Ostomy and/or Continence Assessment and Care []  - 0 Incontinence Assessment and Management []  - 0 Ostomy Care Assessment and Management (repouching, etc.) PROCESS - Coordination of Care X - Simple Patient / Family Education for ongoing care 1 15 []  - 0 Complex (extensive) Patient / Family Education for ongoing care X- 1 10 Staff obtains Programmer, systems, Records, T Results / Process Orders est []  - 0 Staff telephones HHA, Nursing Homes / Clarify orders / etc []  - 0 Routine Transfer to another Facility (non-emergent condition) []  - 0 Routine Hospital Admission (non-emergent condition) []  - 0 New Admissions / Biomedical engineer / Ordering NPWT Apligraf, etc. , []  - 0 Emergency Hospital Admission (emergent condition) X- 1 10 Simple Discharge Coordination []  - 0 Complex (extensive) Discharge Coordination PROCESS - Special Needs []  - 0 Pediatric / Minor Patient Management []  - 0 Isolation Patient Management []  - 0 Hearing / Language / Visual special needs []  - 0 Assessment of Community assistance (transportation, D/C planning, etc.) []  - 0 Additional assistance / Altered mentation []  -  0 Support Surface(s) Assessment (bed, cushion, seat, etc.) INTERVENTIONS - Wound Cleansing / Measurement X - Simple Wound Cleansing - one wound 1 5 []  - 0 Complex  Wound Cleansing - multiple wounds X- 1 5 Wound Imaging (photographs - any number of wounds) []  - 0 Wound Tracing (instead of photographs) X- 1 5 Simple Wound Measurement - one wound []  - 0 Complex Wound Measurement - multiple wounds INTERVENTIONS - Wound Dressings X - Small Wound Dressing one or multiple wounds 1 10 []  - 0 Medium Wound Dressing one or multiple wounds []  - 0 Large Wound Dressing one or multiple wounds []  - 0 Application of Medications - topical []  - 0 Application of Medications - injection INTERVENTIONS - Miscellaneous []  - 0 External ear exam []  - 0 Specimen Collection (cultures, biopsies, blood, body fluids, etc.) []  - 0 Specimen(s) / Culture(s) sent or taken to Lab for analysis []  - 0 Patient Transfer (multiple staff / Civil Service fast streamer / Similar devices) []  - 0 Simple Staple / Suture removal (25 or less) []  - 0 Complex Staple / Suture removal (26 or more) []  - 0 Hypo / Hyperglycemic Management (close monitor of Blood Glucose) []  - 0 Ankle / Brachial Index (ABI) - do not check if billed separately X- 1 5 Vital Signs Has the patient been seen at the hospital within the last three years: Yes Total Score: 90 Level Of Care: New/Established - Level 3 Electronic Signature(s) Signed: 12/22/2020 11:40:20 AM By: Rhae Hammock RN Entered By: Rhae Hammock on 12/20/2020 12:54:14 -------------------------------------------------------------------------------- Encounter Discharge Information Details Patient Name: Date of Service: [name] I. 12/20/2020 12:45 PM Medical Record Number: [id number] Patient Account Number: [id number] Date of Birth/Sex: Treating RN: Dec 13, 1945 (75 y.o. Tonita Phoenix, Lauren Primary Care Maisee Vollman: Domingo Mend Other Clinician: Referring Tricia Pledger: Treating  Murphy Bundick/Extender: Allene Pyo in Treatment: 5 Encounter Discharge Information Items Discharge Condition: Stable Ambulatory Status: Ambulatory Discharge Destination: Home Transportation: Private Auto Accompanied By: self Schedule Follow-up Appointment: Yes Clinical Summary of Care: Patient Declined Electronic Signature(s) Signed: 12/22/2020 11:40:20 AM By: Rhae Hammock RN Entered By: Rhae Hammock on 12/20/2020 12:54:46 -------------------------------------------------------------------------------- Lower Extremity Assessment Details Patient Name: Date of Service: [name] I. 12/20/2020 12:45 PM Medical Record Number: [id number] Patient Account Number: [id number] Date of Birth/Sex: Treating RN: 04-Jul-1945 (76 y.o. Tonita Phoenix, Lauren Primary Care Kamoria Lucien: Domingo Mend Other Clinician: Referring Deaisa Merida: Treating Calil Amor/Extender: Allene Pyo in Treatment: 5 Edema Assessment Assessed: [Left: No] [Right: Yes] Edema: [Left: Ye] [Right: s] Calf Left: Right: Point of Measurement: 33 cm From Medial Instep 32 cm Ankle Left: Right: Point of Measurement: 7 cm From Medial Instep 19 cm Vascular Assessment Pulses: Dorsalis Pedis Palpable: [Right:Yes] Posterior Tibial Palpable: [Right:Yes] Electronic Signature(s) Signed: 12/22/2020 11:40:20 AM By: Rhae Hammock RN Entered By: Rhae Hammock on 12/20/2020 12:48:54 -------------------------------------------------------------------------------- Multi Wound Chart Details Patient Name: Date of Service: [name] I. 12/20/2020 12:45 PM Medical Record Number: [id number] Patient Account Number: [id number] Date of Birth/Sex: Treating RN: [birth year] (75 y.o. Nancy Fetter Primary Care Aviyana Sonntag: Domingo Mend Other Clinician: Referring Ellie Bryand: Treating Blima Jaimes/Extender: Allene Pyo in Treatment:  5 Vital Signs Height(in): 60 Pulse(bpm): 29 Weight(lbs): 117 Blood Pressure(mmHg): 155/74 Body Mass Index(BMI): 23 Temperature(F): 97.7 Respiratory Rate(breaths/min): 17 Photos: [1:No Photos Right, Posterior Lower Leg] [N/A:N/A N/A] Wound Location: [1:Gradually Appeared] [N/A:N/A] Wounding Event: [1:Venous Leg Ulcer] [N/A:N/A] Primary Etiology: [1:09/14/2020] [N/A:N/A] Date Acquired: [1:5] [N/A:N/A] Weeks of Treatment: [1:Healed - Epithelialized] [N/A:N/A] Wound Status: [1:0x0x0] [N/A:N/A] Measurements L x W x D (cm) [1:0] [N/A:N/A] A (cm) : rea [1:0] [N/A:N/A] Volume (cm) : [1:100.00%] [  N/A:N/A] % Reduction in A rea: [1:100.00%] [N/A:N/A] % Reduction in Volume: [1:Full Thickness Without Exposed] [N/A:N/A] Classification: [1:Support Structures Medium] [N/A:N/A] Exudate Amount: [1:Serosanguineous] [N/A:N/A] Exudate Type: [1:red, brown] [N/A:N/A] Treatment Notes Wound #1 (Lower Leg) Wound Laterality: Right, Posterior Cleanser Peri-Wound Care Topical Primary Dressing Secondary Dressing Secured With Compression Wrap Compression Stockings Add-Ons Electronic Signature(s) Signed: 12/20/2020 4:51:07 PM By: [name] MD Signed: 12/20/2020 5:40:46 PM By: Levan Hurst RN, BSN Entered By: [name] on 12/20/2020 12:58:32 -------------------------------------------------------------------------------- Multi-Disciplinary Care Plan Details Patient Name: Date of Service: [name] I. 12/20/2020 12:45 PM Medical Record Number: [id number] Patient Account Number: [id number] Date of Birth/Sex: Treating RN: 02-19-1945 (75 y.o. Tonita Phoenix, Lauren Primary Care Nolene Rocks: Domingo Mend Other Clinician: Referring Jaidin Ugarte: Treating Richel Millspaugh/Extender: Allene Pyo in Treatment: 5 Multidisciplinary Care Plan reviewed with physician Active Inactive Electronic Signature(s) Signed: 12/22/2020 11:40:20 AM By: Rhae Hammock  RN Entered By: Rhae Hammock on 12/20/2020 12:53:02 -------------------------------------------------------------------------------- Pain Assessment Details Patient Name: Date of Service: [name] I. 12/20/2020 12:45 PM Medical Record Number: [id number] Patient Account Number: [id number] Date of Birth/Sex: Treating RN: 1945-02-07 (76 y.o. Tonita Phoenix, Lauren Primary Care Arvis Zwahlen: Domingo Mend Other Clinician: Referring Kazzandra Desaulniers: Treating Octaviano Mukai/Extender: Allene Pyo in Treatment: 5 Active Problems Location of Pain Severity and Description of Pain Patient Has Paino No Site Locations Pain Management and Medication Current Pain Management: Electronic Signature(s) Signed: 12/22/2020 11:40:20 AM By: Rhae Hammock RN Entered By: Rhae Hammock on 12/20/2020 12:48:46 -------------------------------------------------------------------------------- Patient/Caregiver Education Details Patient Name: Date of Service: [name] I. 12/14/2022andnbsp12:45 PM Medical Record Number: [id number] Patient Account Number: [id number] Date of Birth/Gender: Treating RN: 12-Mar-1945 (75 y.o. Benjaman Lobe Primary Care Physician: Domingo Mend Other Clinician: Referring Physician: Treating Physician/Extender: Allene Pyo in Treatment: 5 Education Assessment Education Provided To: Patient Education Topics Provided Wound/Skin Impairment: Methods: Explain/Verbal Responses: State content correctly Electronic Signature(s) Signed: 12/22/2020 11:40:20 AM By: Rhae Hammock RN Entered By: Rhae Hammock on 12/20/2020 12:53:34 -------------------------------------------------------------------------------- Wound Assessment Details Patient Name: Date of Service: [name] I. 12/20/2020 12:45 PM Medical Record Number: [id number] Patient Account Number: [id number] Date of  Birth/Sex: Treating RN: 05-04-1945 (75 y.o. Tonita Phoenix, Lauren Primary Care Angelee Bahr: Domingo Mend Other Clinician: Referring Nattalie Santiesteban: Treating Josef Tourigny/Extender: Ladean Raya , Magdalen Spatz in Treatment: 5 Wound Status Wound Number: 1 Primary Etiology: Venous Leg Ulcer Wound Location: Right, Posterior Lower Leg Wound Status: Healed - Epithelialized Wounding Event: Gradually Appeared Date Acquired: 09/14/2020 Weeks Of Treatment: 5 Clustered Wound: No Photos Photo Uploaded By: Donavan Burnet on 12/21/2020 09:18:47 Wound Measurements Length: (cm) Width: (cm) Depth: (cm) Area: (cm) Volume: (cm) 0 % Reduction in Area: 100% 0 % Reduction in Volume: 100% 0 0 0 Wound Description Classification: Full Thickness Without Exposed Support Structu Exudate Amount: Medium Exudate Type: Serosanguineous Exudate Color: red, brown res Treatment Notes Wound #1 (Lower Leg) Wound Laterality: Right, Posterior Cleanser Peri-Wound Care Topical Primary Dressing Secondary Dressing Secured With Compression Wrap Compression Stockings Add-Ons Electronic Signature(s) Signed: 12/22/2020 11:40:20 AM By: Rhae Hammock RN Entered By: Rhae Hammock on 12/20/2020 12:52:34 -------------------------------------------------------------------------------- Vitals Details Patient Name: Date of Service: [name] I. 12/20/2020 12:45 PM Medical Record Number: [id number] Patient Account Number: [id number] Date of Birth/Sex: Treating RN: [birth year] (75 y.o. Tonita Phoenix, Lauren Primary Care Naoki Migliaccio: Domingo Mend Other Clinician: Referring Mahoganie Basher: Treating Tyrique Sporn/Extender: Ladean Raya , Magdalen Spatz in Treatment: 5  Vital Signs Time Taken: 12:48 Temperature (F): 97.7 Height (in): 60 Pulse (bpm): 74 Weight (lbs): 117 Respiratory Rate (breaths/min): 17 Body Mass Index (BMI): 22.8 Blood Pressure (mmHg): 155/74 Reference Range: 80 -  120 mg / dl Electronic Signature(s) Signed: 12/22/2020 11:40:20 AM By: Rhae Hammock RN Entered By: Rhae Hammock on 12/20/2020 12:48:42

## 2020-12-22 NOTE — Progress Notes (Signed)
[name] ([id number]) Visit Report for 12/20/2020 HPI Details Patient Name: Date of Service: [name] 12/20/2020 12:45 PM Medical Record Number: [id number] Patient Account Number: [id number] Date of Birth/Sex: Treating RN: 1945-08-14 (75 y.o. [name] Primary Care Provider: Domingo [name] Other Clinician: Referring Provider: Treating Provider/Extender: [name] in Treatment: 5 History of Present Illness HPI Description: ADMISSION 11/15/2020 This is a 75 year old woman with a history of squamous cell cancer skin cancer followed by Dr. Anabel Bene dermatology. She describes having a wound on the right posterior calf dating back since September. She saw her primary doctor in early October who suggested petroleum infused gauze. The patient has been doing this. No real improvement in fact she says this is getting larger. She does not complain much of pain. She is completely uncertain about how this happened there was no trauma she simply became aware of it at some point describing it is about the size of a quarter but it has been getting progressively larger since then. She is here for review of this. Past medical history Crohn's disease not currently on prednisone, gastroesophageal reflux disease, history of squamous cell skin cancer, history of rheumatoid arthritis, hypertension 11/16; this lady had a large superficial atypical wound on the right posterior calf. She has significant solar skin damage probably some degree of venous insufficiency but she described a rapidly expanding wound area. Because of this and a history of skin cancer I went ahead and did a shave biopsy but we do not have the results of this at the time we saw her in clinic today. She has been using silver alginate and kerlix 11/30; shave biopsy I did last time did not show any malignancy or fungal elements. Suggestion of a stasis ulcer. With our treatment which is  simply been silver alginate with kerlix that she is changing daily the wound is down dramatically almost surprising how rapidly it is reduced in surface area 12/14; patient comes in today with her wound healed this was on the right posterior calf. She has significant solar skin damage probably some degree of venous insufficiency however she described a rapidly expanding wound area on admission. Because of this and a history of skin cancer I did do a shave biopsy but nothing was really found of worry. She tells me that she had biopsies and her gastroenterologist is now saying she has "colitis" rather than Crohn's disease and she is now on steroids orally Electronic Signature(s) Signed: 12/20/2020 4:51:07 PM By: April Ham MD Entered By: [name] on 12/20/2020 13:00:06 -------------------------------------------------------------------------------- Physical Exam Details Patient Name: Date of Service: [name] I. 12/20/2020 12:45 PM Medical Record Number: [id number] Patient Account Number: [id number] Date of Birth/Sex: Treating RN: 1945/07/17 (75 y.o. [name] Primary Care Provider: Domingo [name] Other Clinician: Referring Provider: Treating Provider/Extender: [name] in Treatment: 5 Constitutional Patient is hypertensive.. Pulse regular and within target range for patient.Marland Kitchen Respirations regular, non-labored and within target range.. Temperature is normal and within the target range for the patient.Marland Kitchen Appears in no distress. Notes Wound exam; right posterior calf everything is epithelialized. Her skin is dry somewhat flaky. She tells me she uses moisturizer. She has what appears to be solar induced skin damage probably some degree of chronic venous insufficiency. Minimal amounts of edema Electronic Signature(s) Signed: 12/20/2020 4:51:07 PM By: April Ham MD Entered By: [name] on 12/20/2020  13:00:53 -------------------------------------------------------------------------------- Physician Orders Details Patient Name:  Date of Service: [name] 12/20/2020 12:45 PM Medical Record Number: [id number] Patient Account Number: [id number] Date of Birth/Sex: Treating RN: January 31, 1945 (75 y.o. [name], [name] Primary Care Provider: Domingo [name] Other Clinician: Referring Provider: Treating Provider/Extender: [name] in Treatment: 5 Verbal / Phone Orders: No Diagnosis Coding [facility] Discharge from [place] Signature(s) Signed: 12/20/2020 4:51:07 PM By: April Ham MD Signed: 12/22/2020 11:40:20 AM By: April Hammock RN Entered By: [name] on 12/20/2020 12:52:56 -------------------------------------------------------------------------------- Problem List Details Patient Name: Date of Service: [name] I. 12/20/2020 12:45 PM Medical Record Number: [id number] Patient Account Number: [id number] Date of Birth/Sex: Treating RN: 1945-12-14 (75 y.o. [name] Primary Care Provider: Domingo [name] Other Clinician: Referring Provider: Treating Provider/Extender: [name] in Treatment: 5 Active Problems ICD-10 Encounter Code Description Active Date MDM Diagnosis L97.211 Non-pressure chronic ulcer of right calf limited to breakdown of skin 11/15/2020 No Yes I87.301 Chronic venous hypertension (idiopathic) without complications of right lower 11/15/2020 No Yes extremity K50.919 Crohn's disease, unspecified, with unspecified complications 74/0/8144 No Yes Inactive Problems Resolved Problems Electronic Signature(s) Signed: 12/20/2020 4:51:07 PM By: April Ham MD Entered By: [name] on 12/20/2020 12:58:25 -------------------------------------------------------------------------------- Progress Note  Details Patient Name: Date of Service: [name] I. 12/20/2020 12:45 PM Medical Record Number: [id number] Patient Account Number: [id number] Date of Birth/Sex: Treating RN: 1945-07-01 (75 y.o. [name] Primary Care Provider: Domingo [name] Other Clinician: Referring Provider: Treating Provider/Extender: Ladean Raya , Magdalen Spatz in Treatment: 5 Subjective History of Present Illness (HPI) ADMISSION 11/15/2020 This is a 75 year old woman with a history of squamous cell cancer skin cancer followed by Dr. Anabel Bene dermatology. She describes having a wound on the right posterior calf dating back since September. She saw her primary doctor in early October who suggested petroleum infused gauze. The patient has been doing this. No real improvement in fact she says this is getting larger. She does not complain much of pain. She is completely uncertain about how this happened there was no trauma she simply became aware of it at some point describing it is about the size of a quarter but it has been getting progressively larger since then. She is here for review of this. Past medical history Crohn's disease not currently on prednisone, gastroesophageal reflux disease, history of squamous cell skin cancer, history of rheumatoid arthritis, hypertension 11/16; this lady had a large superficial atypical wound on the right posterior calf. She has significant solar skin damage probably some degree of venous insufficiency but she described a rapidly expanding wound area. Because of this and a history of skin cancer I went ahead and did a shave biopsy but we do not have the results of this at the time we saw her in clinic today. She has been using silver alginate and kerlix 11/30; shave biopsy I did last time did not show any malignancy or fungal elements. Suggestion of a stasis ulcer. With our treatment which is simply been silver alginate with kerlix that she is changing  daily the wound is down dramatically almost surprising how rapidly it is reduced in surface area 12/14; patient comes in today with her wound healed this was on the right posterior calf. She has significant solar skin damage probably some degree of venous insufficiency however she described a rapidly expanding wound area on admission. Because of this and a history of skin cancer  I did do a shave biopsy but nothing was really found of worry. She tells me that she had biopsies and her gastroenterologist is now saying she has "colitis" rather than Crohn's disease and she is now on steroids orally Objective Constitutional Patient is hypertensive.. Pulse regular and within target range for patient.Marland Kitchen Respirations regular, non-labored and within target range.. Temperature is normal and within the target range for the patient.Marland Kitchen Appears in no distress. Vitals Time Taken: 12:48 PM, Height: 60 in, Weight: 117 lbs, BMI: 22.8, Temperature: 97.7 F, Pulse: 74 bpm, Respiratory Rate: 17 breaths/min, Blood Pressure: 155/74 mmHg. General Notes: Wound exam; right posterior calf everything is epithelialized. Her skin is dry somewhat flaky. She tells me she uses moisturizer. She has what appears to be solar induced skin damage probably some degree of chronic venous insufficiency. Minimal amounts of edema Integumentary (Hair, Skin) Wound #1 status is Healed - Epithelialized. Original cause of wound was Gradually Appeared. The date acquired was: 09/14/2020. The wound has been in treatment 5 weeks. The wound is located on the Right,Posterior Lower Leg. The wound measures 0cm length x 0cm width x 0cm depth; 0cm^2 area and 0cm^3 volume. There is a medium amount of serosanguineous drainage noted. 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 The patient is healed and can be discharged from the clinic 2. She is using skin moisturizer. I could not talk her into any form of stocking even a support stocking. 3. She has been put on steroids increasing edema might be a problem for this woman I discussed this with the Electronic Signature(s) Signed: 12/20/2020 4:51:07 PM By: April Ham MD Entered By: [name] on 12/20/2020 13:01:42 -------------------------------------------------------------------------------- SuperBill Details Patient Name: Date of Service: [name] I. 12/20/2020 Medical Record Number: [id number] Patient Account Number: [id number] Date of Birth/Sex: Treating RN: 1945-09-09 (75 y.o. [name], [name] Primary Care Provider: Domingo [name] Other Clinician: Referring Provider: Treating Provider/Extender: [name] in Treatment: 5 Diagnosis Coding ICD-10 Codes Code Description [phone] Non-pressure chronic ulcer of right calf limited to breakdown of skin I87.301 Chronic venous hypertension (idiopathic) without complications of right lower extremity K50.919 Crohn's disease, unspecified, with unspecified complications Facility Procedures CPT4 Code: [id number] Description: 99213 - WOUND CARE VISIT-LEV 3 EST PT Modifier: Quantity: 1 Physician Procedures : CPT4 Code Description Modifier [id number] - WC PHYS LEVEL 2 - EST PT ICD-10 Diagnosis Description L97.211 Non-pressure chronic ulcer of right calf limited to breakdown of skin I87.301 Chronic venous hypertension (idiopathic) without complications of  right lower extremity K50.919 Crohn's disease, unspecified, with unspecified complications Quantity: 1 Electronic Signature(s) Signed: 12/20/2020 4:51:07 PM By: April Ham MD Entered By: [name] on 12/20/2020 13:02:01

## 2021-01-06 DIAGNOSIS — F32A Depression, unspecified: Secondary | ICD-10-CM

## 2021-01-06 DIAGNOSIS — I10 Essential (primary) hypertension: Secondary | ICD-10-CM

## 2021-01-07 NOTE — Patient Instructions (Addendum)
Hi [name],  It was great to get to meet you in person! Below is a summary of some of the topics we discussed. As we discussed, please try to avoid using omeprazole three times daily as this puts you at a higher risk of having side effects and the long term risks that come with taking a medication like that.   Don't forget to discuss Raynaud's with Dr. Jerilee Hoh at your next visit!  Please reach out to me if you have any questions or need anything ! I scheduled you for a 6 month follow up but if that day/time doesn't work, please let me know.  Best, April  Jeni [name], PharmD, [facility]   Visit Information   Goals Addressed             This Visit's Progress    Track and Manage My Blood Pressure-Hypertension       Timeframe:  Long-Range Goal Priority:  Medium Start Date:                             Expected End Date:                       Follow Up Date 03/19/21    - check blood pressure weekly - choose a place to take my blood pressure (home, clinic or office, retail store) - write blood pressure results in a log or diary    Why is this important?   You won't feel high blood pressure, but it can still hurt your blood vessels.  High blood pressure can cause heart or kidney problems. It can also cause a stroke.  Making lifestyle changes like losing a little weight or eating less salt will help.  Checking your blood pressure at home and at different times of the day can help to control blood pressure.  If the doctor prescribes medicine remember to take it the way the doctor ordered.  Call the office if you cannot afford the medicine or if there are questions about it.     Notes:        Patient Care Plan: CCM Pharmacy Care Plan     Problem Identified: Problem: Hypertension, Hyperlipidemia, GERD, Chronic Kidney Disease, Depression, and Allergic Rhinitis      Long-Range Goal: Patient-Specific Goal    Start Date: 12/19/2020  Expected End Date: 12/19/2021  This Visit's Progress: On track  Priority: High  Note:   Current Barriers:  Unable to independently monitor therapeutic efficacy  Pharmacist Clinical Goal(s):  Patient will achieve adherence to monitoring guidelines and medication adherence to achieve therapeutic efficacy through collaboration with PharmD and provider.   Interventions: 1:1 collaboration with [name], April Halsted, MD regarding development and update of comprehensive plan of care as evidenced by provider attestation and co-signature Inter-disciplinary care team collaboration (see longitudinal plan of care) Comprehensive medication review performed; medication list updated in electronic medical record  Hypertension (BP goal <140/90) -Controlled -Current treatment: Losartan 100 mg 1 tablet daily -Medications previously tried: n/a  -Current home readings: (checking occasionally - wrist cuff) - not brought into office  -Current dietary habits: pays attention to salt intake; eats out once a week [name]); does look at sugar and sodium content - sometimes frozen meals and canned vegetables  -Current exercise habits: walking 3 days a week -Denies hypotensive/hypertensive symptoms -Educated on BP goals and benefits of medications for  prevention of heart attack, stroke and kidney damage; Importance of home blood pressure monitoring; Proper BP monitoring technique; Symptoms of hypotension and importance of maintaining adequate hydration; -Counseled to monitor BP at home weekly, document, and provide log at future appointments -Counseled on diet and exercise extensively Recommended to continue current medication Recommended checking BP when having headaches.  Depression (Goal: minimize symptoms) -Controlled -Current treatment: Duloxetine 60 mg 1 capsule twice daily -Medications previously tried/failed: n/a -PHQ9: 1 -GAD7: n/a -Educated on Benefits of  medication for symptom control -Collaborated with PCP to consider taper as patient isn't sure if she needs this anymore.  GERD/GI (Goal: minimize symptoms) -Not ideally controlled -Current treatment  Omeprazole 40 mg three times daily  Budesonide 3 mg 3 capsules daily Colestipol 1 g 2 tablets twice daily Loperamide 2 mg 1 capsule three times daily as needed - twice a day Dicyclomine 20 mg 1 tablet 4 times daily as needed (using every day) Mesalamine 4 tablets with breakfast -Medications previously tried: Nexium, colestipol (constipation)  -Counseled on benefits of visit with nutritionist but patient declined. Recommended against taking omeprazole three times daily due to risks for side effects and worsening of diarrhea.  Allergic rhinitis (Goal: minimize symptoms) -Controlled -Current treatment  Cetirizine 10 mg 1 tablet daily Fluticasone 50 mcg/act 2 sprays in both nostrils daily Saline nasal spray as needed (nasal cleanse) -Medications previously tried: none  -Recommended to continue current medication  Bone health (Goal prevent fractures) -Not ideally controlled -Last DEXA Scan: n/a   T-Score femoral neck: n/a  T-Score total hip: n/a  T-Score lumbar spine: n/a  T-Score forearm radius: n/a  10-year probability of major osteoporotic fracture: n/a  10-year probability of hip fracture: n/a -Patient is not a candidate for pharmacologic treatment -Current treatment  Calcium-vitamin D 600 mg-25 mcg twice daily (total for the day) -Medications previously tried: Fosamax (failed)  -Recommend [phone] units of vitamin D daily. Recommend 1200 mg of calcium daily from dietary and supplemental sources. Recommend weight-bearing and muscle strengthening exercises for building and maintaining bone density. -Counseled on diet and exercise extensively Recommended repeat DEXA.   Health Maintenance -Vaccine gaps:  shingles, influenza (egg allergy), tetanus, COVID booster  -Current therapy:   Multivitamin daily Triamcinolone cream 0.1% as needed -Educated on Cost vs benefit of each product must be carefully weighed by individual consumer -Patient is satisfied with current therapy and denies issues -Recommended to continue current medication  Patient Goals/Self-Care Activities Patient will:  - take medications as prescribed as evidenced by patient report and record review check blood pressure weekly, document, and provide at future appointments target a minimum of 150 minutes of moderate intensity exercise weekly  Follow Up Plan: Telephone follow up appointment with care management team member scheduled for:      [name] was given information about Chronic Care Management services today including:  CCM service includes personalized support from designated clinical staff supervised by her physician, including individualized plan of care and coordination with other care providers 24/7 contact phone numbers for assistance for urgent and routine care needs. Standard insurance, coinsurance, copays and deductibles apply for chronic care management only during months in which we provide at least 20 minutes of these services. Most insurances cover these services at 100%, however patients may be responsible for any copay, coinsurance and/or deductible if applicable. This service may help you avoid the need for more expensive face-to-face services. Only one practitioner may furnish and bill the service in a calendar month. The patient may stop  CCM services at any time (effective at the end of the month) by phone call to the office staff.  Patient agreed to services and verbal consent obtained.   The patient verbalized understanding of instructions, educational materials, and care plan provided today and agreed to receive a mailed copy of patient instructions, educational materials, and care plan.  Telephone follow up appointment with pharmacy team member scheduled for: 6  months  [name], [facility]

## 2021-01-11 ENCOUNTER — Other Ambulatory Visit: Payer: Self-pay | Admitting: Internal Medicine

## 2021-01-31 ENCOUNTER — Other Ambulatory Visit: Payer: Self-pay | Admitting: Internal Medicine

## 2021-01-31 DIAGNOSIS — K219 Gastro-esophageal reflux disease without esophagitis: Secondary | ICD-10-CM

## 2021-02-06 ENCOUNTER — Encounter: Payer: Self-pay | Admitting: Internal Medicine

## 2021-02-06 ENCOUNTER — Ambulatory Visit (INDEPENDENT_AMBULATORY_CARE_PROVIDER_SITE_OTHER): Payer: Medicare Other | Admitting: Internal Medicine

## 2021-02-06 VITALS — BP 126/80 | HR 78 | Resp 95 | Ht 60.0 in | Wt 118.1 lb

## 2021-02-06 DIAGNOSIS — R194 Change in bowel habit: Secondary | ICD-10-CM

## 2021-02-06 DIAGNOSIS — K219 Gastro-esophageal reflux disease without esophagitis: Secondary | ICD-10-CM

## 2021-02-06 DIAGNOSIS — K591 Functional diarrhea: Secondary | ICD-10-CM | POA: Diagnosis not present

## 2021-02-06 DIAGNOSIS — K5010 Crohn's disease of large intestine without complications: Secondary | ICD-10-CM | POA: Diagnosis not present

## 2021-02-06 MED ORDER — OMEPRAZOLE 40 MG PO CPDR: 40.0000 mg | DELAYED_RELEASE_CAPSULE | Freq: Two times a day (BID) | ORAL | 1 refills | Status: DC

## 2021-02-06 MED ORDER — DICYCLOMINE HCL 20 MG PO TABS: 20.0000 mg | ORAL_TABLET | Freq: Four times a day (QID) | ORAL | 3 refills | Status: DC | PRN

## 2021-02-06 MED ORDER — COLESTIPOL HCL 1 G PO TABS: 2.0000 g | ORAL_TABLET | Freq: Two times a day (BID) | ORAL | 3 refills | Status: DC

## 2021-02-06 MED ORDER — MESALAMINE 1.2 G PO TBEC: 4.8000 g | DELAYED_RELEASE_TABLET | Freq: Every day | ORAL | 3 refills | Status: DC

## 2021-02-06 MED ORDER — LOPERAMIDE HCL 2 MG PO CAPS: 2.0000 mg | ORAL_CAPSULE | Freq: Three times a day (TID) | ORAL | 6 refills | Status: DC | PRN

## 2021-02-06 NOTE — Patient Instructions (Addendum)
If you are age 76 or older, your body mass index should be between 23-30. Your Body mass index is 23.07 kg/m. If this is out of the aforementioned range listed, please consider follow up with your Primary Care Provider.  If you are age 18 or younger, your body mass index should be between 19-25. Your Body mass index is 23.07 kg/m. If this is out of the aformentioned range listed, please consider follow up with your Primary Care Provider.   ________________________________________________________  The [facility] providers would like to encourage you to use [facility] to communicate with providers for non-urgent requests or questions.  Due to long hold times on the telephone, sending your provider a message by [facility] may be a faster and more efficient way to get a response.  Please allow 48 business hours for a response.  Please remember that this is for non-urgent requests.   We have sent the following medications to your pharmacy for you to pick up at your convenience:  Colestid, Bentyl, Imodium, Lialda, Prilosec  Stop Budesonide  Take Citrucel daily - 1-2 tablespoons in water or juice

## 2021-02-06 NOTE — Progress Notes (Signed)
HISTORY OF PRESENT ILLNESS:  [name] is a 76 y.o. female with multiple medical problems as listed below.  Extensive GI history (mostly elsewhere until establishing care) as chronicled in detail.  Last office visit October 04, 2020.  See that dictation.  The following is a brief summary:  1.  Chronic diarrhea.  Multifactorial.  Has had a positive, but incomplete response to Colestid 2.  History of Crohn's colitis, elsewhere.  Relatively recent complaints of rectal bleeding led to complete colonoscopy which was performed November 23, 2020.  She was found to have a normal ileum.  The colon revealed mild patchy colitis.  Biopsies confirmed mildly active chronic colitis consistent with IBD.  She was placed on budesonide and told to follow-up at this time. 3.  Large symptomatic paraesophageal hernia.  Has had surgical evaluation.  Offered surgery.  Patient has declined at present. 4.  GERD.  On omeprazole 5.  IBS  Patient presents today for follow-up.  She has been on budesonide 9 mg daily for 2 months.  She reports no change in symptoms.  She describes having generally 2 but up to 4 loose bowel movements per day.  Once or twice per month she will have no bowel movement.  She will hold Colestid.  She does not have issues with incontinence.  She does not take laxatives.  She has continued on Colestid 2 g twice daily, dicyclomine as needed (generally once per day), Imodium 1 p.o. twice daily, and Lialda 4.8 g daily (which she has been on for some time).  No new complaints.  REVIEW OF SYSTEMS:  All non-GI ROS negative unless otherwise stated in the HPI except for arthritis, back pain, headaches, sleeping problems, sinus and allergy  Past Medical History:  Diagnosis Date   Allergies    Bruises easily    Complication of anesthesia    Crohn's disease ([place])    GI Dr Beulah Gandy in [place], [place]    Deviated septum    GERD (gastroesophageal reflux disease)    Headache    occ    Hiatal hernia     HTN (hypertension)    pcp Dr Margaretann Loveless  in [place], [place]    Kidney disease, chronic, stage III (GFR 30-59 ml/min) ([place])    Neuropathy    fingers,periodically    Osteoarthritis    Pancreatic insufficiency    PONV (postoperative nausea and vomiting)    Rheumatoid arthritis ([place])    Scoliosis    Squamous cell carcinoma of arm    left    Whooping cough 2015   Whooping cough with pneumonia    2017    Past Surgical History:  Procedure Laterality Date   CATARACT EXTRACTION, BILATERAL  2018   with IOC implant    COLONOSCOPY     DEBRIDEMENT AND CLOSURE WOUND Right 10/01/2018   Procedure: Excision of right knee wound with placement of ACell and Pravena;  Surgeon: Wallace Going, DO;  Location: [place];  Service: Plastics;  Laterality: Right;  30 min   JOINT REPLACEMENT  2020   right knee   knee fracture      NASAL FRACTURE SURGERY     needed b/c she was getting frequent sinus infections    TOTAL KNEE ARTHROPLASTY Right 07/28/2018   Procedure: TOTAL KNEE ARTHROPLASTY;  Surgeon: Paralee Cancel, MD;  [facility];  Service: Orthopedics;  Laterality: Right;   TOTAL KNEE ARTHROPLASTY Left 03/02/2019   Procedure: TOTAL KNEE ARTHROPLASTY;  Surgeon:  Paralee Cancel, MD;  Location: [facility];  Service: Orthopedics;  Laterality: Left;  70 mins   UPPER GASTROINTESTINAL ENDOSCOPY      Social History Abreanna I Plotner  reports that she quit smoking about 33 years ago. Her smoking use included cigarettes. She has a 30.00 pack-year smoking history. She has never used smokeless tobacco. She reports that she does not currently use alcohol. She reports that she does not use drugs.  family history includes Colon cancer in an other family member; Diabetes in her father; Parkinson's disease in her father.  Allergies  Allergen Reactions   Adhesive [Tape]     Redness and skin peeling   Aspirin     Intestinal Bleeding   Ciprofloxacin Nausea And Vomiting   Codeine  Nausea And Vomiting   Demerol [Meperidine Hcl] Nausea And Vomiting   Lactose Intolerance (Gi) Other (See Comments)    Pt has Crohn's    Latex     Redness and skin peeling   Other     Nuts-itching in throat  Seeds-stomach issues with chrons     Septra [Sulfamethoxazole-Trimethoprim] Nausea Only       PHYSICAL EXAMINATION: Vital signs: BP 126/80    Pulse 78    Resp (!) 95    Ht 5' (1.524 m)    Wt 118 lb 2 oz (53.6 kg)    BMI 23.07 kg/m   Constitutional: generally well-appearing, no acute distress Psychiatric: alert and oriented x3, cooperative Eyes: extraocular movements intact, anicteric, conjunctiva pink Mouth: oral pharynx moist, no lesions Neck: supple no lymphadenopathy Cardiovascular: heart regular rate and rhythm, no murmur Lungs: clear to auscultation bilaterally Abdomen: soft, nontender, nondistended, no obvious ascites, no peritoneal signs, normal bowel sounds, no organomegaly Rectal: Omitted Extremities: no clubbing, cyanosis, or lower extremity edema bilaterally Skin: no lesions on visible extremities Neuro: No focal deficits.  Cranial nerves intact  IMPRESSIONS:  1.  Chronic diarrhea.  Multifactorial.  Has had a positive, but incomplete response to Colestid 2.  History of Crohn's colitis, elsewhere.  Relatively recent complaints of rectal bleeding led to complete colonoscopy which was performed November 23, 2020.  She was found to have a normal ileum.  The colon revealed mild patchy colitis.  Biopsies confirmed mildly active chronic colitis consistent with IBD.  She was placed on budesonide and told to follow-up at this time. 3.  Large symptomatic paraesophageal hernia.  Has had surgical evaluation.  Offered surgery.  Patient has declined at present. 4.  GERD.  On omeprazole 5.  IBS   PLAN:  1.  Stop budesonide 2.  Refill Colestid, dicyclomine, Imodium, mesalamine, and omeprazole.  Medication risks reviewed. 3.  Add Citrucel 2 tablespoons daily 4.  Routine  office follow-up 6 weeks A total time of 30 minutes was spent preparing to see the patient, obtaining comprehensive history, performing medically appropriate physical exam, counseling the patient regarding her above listed issues, ordering medications, arranging follow-up, and documenting clinical information in the health record

## 2021-02-07 ENCOUNTER — Telehealth: Payer: Self-pay | Admitting: Pharmacist

## 2021-02-07 NOTE — Chronic Care Management (AMB) (Signed)
Chronic Care Management Pharmacy Assistant   Name: [name]  MRN: [id number] DOB: 1945-10-22  Reason for Encounter: Disease State / Hypertension Assessment Call   Conditions to be addressed/monitored: HTN   Recent office visits:  None  Recent consult visits:  02/06/2021 April Shorts MD (GI ) - Patient was seen for Gastroesophageal reflux disease, unspecified whether esophagitis present and additional issues. Decreased Omeprazole to 40 mg twice daily. No follow up noted.   12/20/2020 April Ham MD (internal med) - Patient was seen for wound care. No medication changes. No follow up noted.   Hospital visits:  None  Medications: Outpatient Encounter Medications as of 02/07/2021  Medication Sig   budesonide (ENTOCORT EC) 3 MG 24 hr capsule Take 3 capsules (9 mg total) by mouth daily.   Calcium Citrate-Vitamin D (CALCIUM + D PO) Take 1 tablet by mouth 2 (two) times daily.   cetirizine (ZYRTEC) 10 MG tablet Take 1 tablet (10 mg total) by mouth daily.   colestipol (COLESTID) 1 g tablet Take 2 tablets (2 g total) by mouth 2 (two) times daily.   dicyclomine (BENTYL) 20 MG tablet Take 1 tablet (20 mg total) by mouth 4 (four) times daily as needed for spasms.   DULoxetine (CYMBALTA) 60 MG capsule Take 1 capsule (60 mg total) by mouth 2 (two) times daily.   fluticasone (FLONASE) 50 MCG/ACT nasal spray Place 2 sprays into both nostrils daily.   loperamide (IMODIUM) 2 MG capsule Take 1 capsule (2 mg total) by mouth 3 (three) times daily as needed for diarrhea or loose stools.   losartan (COZAAR) 100 MG tablet Take 1 tablet (100 mg total) by mouth daily.   mesalamine (LIALDA) 1.2 g EC tablet Take 4 tablets (4.8 g total) by mouth daily with breakfast.   Misc Natural Product Nasal (NASAL CLEANSE RINSE MIX NA) Place 1 Dose into the nose daily as needed (congestion).   Multiple Vitamin (MULTIVITAMIN WITH MINERALS) TABS tablet Take 1 tablet by mouth daily.   omeprazole (PRILOSEC) 40 MG  capsule Take 1 capsule (40 mg total) by mouth in the morning and at bedtime.   No facility-administered encounter medications on file as of 02/07/2021.  Fill History: BUDESONIDE EC 3 MG CAPSULE 12/06/2020 90   CETIRIZINE HCL 10 MG TABLET 12/16/2020 90   COLESTIPOL HCL 1 GM TABLET 01/12/2021 30   DULOXETINE HCL DR 60 MG CAP 01/18/2021 90   DICYCLOMINE 20 MG TABLET 11/17/2020 90   FLUTICASONE PROP 50 MCG SPRAY 12/09/2020 90   LOPERAMIDE 2 MG CAPSULE 10/18/2020 60   LOSARTAN POTASSIUM 100 MG TAB 11/21/2020 90   MESALAMINE DR 1.2 GM TABLET 11/09/2020 90   OMEPRAZOLE DR 40 MG CAPSULE 01/10/2021 90   TRIAMCINOLONE 0.1% CREAM 11/12/2020 90   Reviewed chart prior to disease state call. Spoke with patient regarding BP  Recent Office Vitals: BP Readings from Last 3 Encounters:  02/06/21 126/80  11/23/20 112/69  10/26/20 130/90   Pulse Readings from Last 3 Encounters:  02/06/21 78  11/23/20 79  10/04/20 73    Wt Readings from Last 3 Encounters:  02/06/21 118 lb 2 oz (53.6 kg)  11/23/20 124 lb (56.2 kg)  10/26/20 121 lb 14.4 oz (55.3 kg)     Kidney Function Lab Results  Component Value Date/Time   CREATININE 1.25 (H) 09/19/2020 11:36 AM   CREATININE 1.07 09/07/2020 03:46 PM   CREATININE 1.10 (H) 11/08/2019 10:48 AM   CREATININE 1.17 (H) 10/06/2019 10:46 AM  GFR 42.24 (L) 09/19/2020 11:36 AM   GFRNONAA >60 03/04/2019 03:27 AM   GFRAA >60 03/04/2019 03:27 AM    BMP Latest Ref Rng & Units 09/19/2020 09/07/2020 11/08/2019  Glucose 70 - 99 mg/dL 87 89 123(H)  BUN 6 - 23 mg/dL 35(H) 26(H) 25  Creatinine 0.40 - 1.20 mg/dL 1.25(H) 1.07 1.10(H)  BUN/Creat Ratio 6 - 22 (calc) - - 23(H)  Sodium 135 - 145 mEq/L 137 137 138  Potassium 3.5 - 5.1 mEq/L 3.9 3.9 4.3  Chloride 96 - 112 mEq/L 106 105 102  CO2 19 - 32 mEq/L 22 25 27   Calcium 8.4 - 10.5 mg/dL 9.1 9.2 9.6    Current antihypertensive regimen:  Losartan 100 mg daily  How often are you checking your Blood Pressure?  Patient is not checking blood pressures at home.   Current home BP readings: Patient is not checking her blood pressures at home, patient states her last office visit blood pressure was 126/80.  What recent interventions/DTPs have been made by any provider to improve Blood Pressure control since last CPP Visit: No recent interventions.   Any recent hospitalizations or ED visits since last visit with CPP? No recent hospital visits.  What diet changes have been made to improve Blood Pressure Control?  Patient is unable to eat much due to crohns disease.  Patient is unable to eat any fruit or vegetables She eats a lot of pasta, chicken and fish.   What exercise is being done to improve your Blood Pressure Control?  Patient does walk daily and does her own housework.   Adherence Review: Is the patient currently on ACE/ARB medication? Yes Does the patient have >5 day gap between last estimated fill dates? No  Care Gaps: AWV - 09/19/2020 Last BP - 130/90 on 10/26/2020 Hep C Screen - never done TDAP - never done DexaScan - never done  Star Rating Drugs: Losartan 100 mg - last filled 11/21/2020 90 DS at [facility] Pharmacist Assistant [phone]

## 2021-03-03 ENCOUNTER — Other Ambulatory Visit: Payer: Self-pay | Admitting: Internal Medicine

## 2021-03-13 ENCOUNTER — Telehealth: Payer: Self-pay | Admitting: Pharmacist

## 2021-03-13 NOTE — Chronic Care Management (AMB) (Signed)
? ? ?  Chronic Care Management ?Pharmacy Assistant  ? ?Name: [name]  MRN: [id number] DOB: 12/13/1945 ? ?Reason for Encounter: Cancel appointment due to CCS. ?Spoke with patient and she voices clear understanding, appointment canceled.  ? ?  ?Gennie Alma CMA  ?Clinical Pharmacist Assistant ?[phone] ? ?

## 2021-03-19 ENCOUNTER — Ambulatory Visit (INDEPENDENT_AMBULATORY_CARE_PROVIDER_SITE_OTHER): Payer: Medicare Other | Admitting: Internal Medicine

## 2021-03-19 ENCOUNTER — Encounter: Payer: Self-pay | Admitting: Internal Medicine

## 2021-03-19 VITALS — BP 130/80 | Temp 98.3°F | Wt 117.1 lb

## 2021-03-19 DIAGNOSIS — K5010 Crohn's disease of large intestine without complications: Secondary | ICD-10-CM

## 2021-03-19 DIAGNOSIS — H9190 Unspecified hearing loss, unspecified ear: Secondary | ICD-10-CM | POA: Diagnosis not present

## 2021-03-19 DIAGNOSIS — K069 Disorder of gingiva and edentulous alveolar ridge, unspecified: Secondary | ICD-10-CM

## 2021-03-19 DIAGNOSIS — F339 Major depressive disorder, recurrent, unspecified: Secondary | ICD-10-CM | POA: Diagnosis not present

## 2021-03-19 DIAGNOSIS — J302 Other seasonal allergic rhinitis: Secondary | ICD-10-CM

## 2021-03-19 DIAGNOSIS — B009 Herpesviral infection, unspecified: Secondary | ICD-10-CM

## 2021-03-19 DIAGNOSIS — I10 Essential (primary) hypertension: Secondary | ICD-10-CM

## 2021-03-19 DIAGNOSIS — K219 Gastro-esophageal reflux disease without esophagitis: Secondary | ICD-10-CM

## 2021-03-19 MED ORDER — FLUTICASONE PROPIONATE 50 MCG/ACT NA SUSP: 2.0000 | Freq: Every day | NASAL | 1 refills | Status: DC

## 2021-03-19 MED ORDER — DULOXETINE HCL 60 MG PO CPEP: 60.0000 mg | ORAL_CAPSULE | Freq: Two times a day (BID) | ORAL | 1 refills | Status: DC

## 2021-03-19 MED ORDER — OMEPRAZOLE 40 MG PO CPDR: 40.0000 mg | DELAYED_RELEASE_CAPSULE | Freq: Two times a day (BID) | ORAL | 1 refills | Status: DC

## 2021-03-19 MED ORDER — DICYCLOMINE HCL 20 MG PO TABS: 20.0000 mg | ORAL_TABLET | Freq: Four times a day (QID) | ORAL | 1 refills | Status: DC | PRN

## 2021-03-19 MED ORDER — LOSARTAN POTASSIUM 100 MG PO TABS: 100.0000 mg | ORAL_TABLET | Freq: Every day | ORAL | 1 refills | Status: DC

## 2021-03-19 MED ORDER — VALACYCLOVIR HCL 500 MG PO TABS: 500.0000 mg | ORAL_TABLET | Freq: Two times a day (BID) | ORAL | 3 refills | Status: AC

## 2021-03-19 NOTE — Patient Instructions (Signed)
-  Nice seeing you today!! ? ?-Valtrex 500 mg twice daily for 3 days when you have a cold sore. ? ?-Schedule follow up in 6 months for your physical. ?

## 2021-03-19 NOTE — Progress Notes (Signed)
Established Patient Office Visit     This visit occurred during the SARS-CoV-2 public health emergency.  Safety protocols were in place, including screening questions prior to the visit, additional usage of staff PPE, and extensive cleaning of exam room while observing appropriate contact time as indicated for disinfecting solutions.    CC/Reason for Visit: Follow-up chronic conditions, discuss some concerns  HPI: [name] is a 76 y.o. female who is coming in today for the above mentioned reasons. Past Medical History is significant for: Crohn's disease, hypertension, pancreatic insufficiency, large paraesophageal hiatal hernia who has declined surgical intervention.  She has been having long-term issues with difficulty hearing despite hearing aids.  She is requesting referral to ENT.  She has recurrent HSV stomatitis and gingivitis and is requesting a prescription for valacyclovir for episodic treatment.   Past Medical/Surgical History: Past Medical History:  Diagnosis Date   Allergies    Bruises easily    Complication of anesthesia    Crohn's disease ([place])    GI Dr Beulah Gandy in [place], [place]    Deviated septum    GERD (gastroesophageal reflux disease)    Headache    occ    Hiatal hernia    HTN (hypertension)    pcp Dr Margaretann Loveless  in [place], [place]    Kidney disease, chronic, stage III (GFR 30-59 ml/min) ([place])    Neuropathy    fingers,periodically    Osteoarthritis    Pancreatic insufficiency    PONV (postoperative nausea and vomiting)    Rheumatoid arthritis ([place])    Scoliosis    Squamous cell carcinoma of arm    left    Whooping cough 2015   Whooping cough with pneumonia    2017    Past Surgical History:  Procedure Laterality Date   CATARACT EXTRACTION, BILATERAL  2018   with IOC implant    COLONOSCOPY     DEBRIDEMENT AND CLOSURE WOUND Right 10/01/2018   Procedure: Excision of right knee wound with placement of ACell and Pravena;   Surgeon: Wallace Going, DO;  Location: [facility];  Service: Plastics;  Laterality: Right;  30 min   JOINT REPLACEMENT  2020   right knee   knee fracture      NASAL FRACTURE SURGERY     needed b/c she was getting frequent sinus infections    TOTAL KNEE ARTHROPLASTY Right 07/28/2018   Procedure: TOTAL KNEE ARTHROPLASTY;  Surgeon: Paralee Cancel, MD;  [facility];  Service: Orthopedics;  Laterality: Right;   TOTAL KNEE ARTHROPLASTY Left 03/02/2019   Procedure: TOTAL KNEE ARTHROPLASTY;  Surgeon: Paralee Cancel, MD;  [facility];  Service: Orthopedics;  Laterality: Left;  70 mins   UPPER GASTROINTESTINAL ENDOSCOPY      Social History:  reports that she quit smoking about 33 years ago. Her smoking use included cigarettes. She has a 30.00 pack-year smoking history. She has never used smokeless tobacco. She reports that she does not currently use alcohol. She reports that she does not use drugs.  Allergies: Allergies  Allergen Reactions   Adhesive [Tape]     Redness and skin peeling   Aspirin     Intestinal Bleeding   Ciprofloxacin Nausea And Vomiting   Codeine Nausea And Vomiting   Demerol [Meperidine Hcl] Nausea And Vomiting   Lactose Intolerance (Gi) Other (See Comments)    Pt has Crohn's    Latex     Redness and skin peeling  Other     Nuts-itching in throat  Seeds-stomach issues with chrons     Septra [Sulfamethoxazole-Trimethoprim] Nausea Only    Family History:  Family History  Problem Relation Age of Onset   Diabetes Father    Parkinson's disease Father    Colon cancer Other    Asthma Neg Hx    Esophageal cancer Neg Hx    Pancreatic cancer Neg Hx    Liver disease Neg Hx    Stomach cancer Neg Hx      Current Outpatient Medications:    Calcium Citrate-Vitamin D (CALCIUM + D PO), Take 1 tablet by mouth 2 (two) times daily., Disp: , Rfl:    cetirizine (ZYRTEC) 10 MG tablet, Take 1 tablet (10 mg total) by mouth daily., Disp: 90 tablet,  Rfl: 1   colestipol (COLESTID) 1 g tablet, Take 2 tablets (2 g total) by mouth 2 (two) times daily., Disp: 120 tablet, Rfl: 3   loperamide (IMODIUM) 2 MG capsule, Take 1 capsule (2 mg total) by mouth 3 (three) times daily as needed for diarrhea or loose stools., Disp: 180 capsule, Rfl: 6   mesalamine (LIALDA) 1.2 g EC tablet, Take 4 tablets (4.8 g total) by mouth daily with breakfast., Disp: 360 tablet, Rfl: 3   Misc Natural Product Nasal (NASAL CLEANSE RINSE MIX NA), Place 1 Dose into the nose daily as needed (congestion)., Disp: , Rfl:    Multiple Vitamin (MULTIVITAMIN WITH MINERALS) TABS tablet, Take 1 tablet by mouth daily., Disp: , Rfl:    valACYclovir (VALTREX) 500 MG tablet, Take 1 tablet (500 mg total) by mouth 2 (two) times daily for 3 days. When you have a cold sore, Disp: 6 tablet, Rfl: 3   dicyclomine (BENTYL) 20 MG tablet, Take 1 tablet (20 mg total) by mouth 4 (four) times daily as needed for spasms., Disp: 360 tablet, Rfl: 1   DULoxetine (CYMBALTA) 60 MG capsule, Take 1 capsule (60 mg total) by mouth 2 (two) times daily., Disp: 180 capsule, Rfl: 1   fluticasone (FLONASE) 50 MCG/ACT nasal spray, Place 2 sprays into both nostrils daily., Disp: 48 mL, Rfl: 1   losartan (COZAAR) 100 MG tablet, Take 1 tablet (100 mg total) by mouth daily., Disp: 90 tablet, Rfl: 1   omeprazole (PRILOSEC) 40 MG capsule, Take 1 capsule (40 mg total) by mouth in the morning and at bedtime., Disp: 180 capsule, Rfl: 1  Review of Systems:  Constitutional: Denies fever, chills, diaphoresis, appetite change and fatigue.  HEENT: Denies photophobia, eye pain, redness, hearing loss, ear pain, congestion, sore throat, rhinorrhea, sneezing, mouth sores, trouble swallowing, neck pain, neck stiffness and tinnitus.   Respiratory: Denies SOB, DOE, cough, chest tightness,  and wheezing.   Cardiovascular: Denies chest pain, palpitations and leg swelling.  Gastrointestinal: Denies nausea, vomiting, abdominal pain, diarrhea,  constipation, blood in stool and abdominal distention.  Genitourinary: Denies dysuria, urgency, frequency, hematuria, flank pain and difficulty urinating.  Endocrine: Denies: hot or cold intolerance, sweats, changes in hair or nails, polyuria, polydipsia. Musculoskeletal: Denies myalgias, back pain, joint swelling, arthralgias and gait problem.  Skin: Denies pallor, rash and wound.  Neurological: Denies dizziness, seizures, syncope, weakness, light-headedness, numbness and headaches.  Hematological: Denies adenopathy. Easy bruising, personal or family bleeding history  Psychiatric/Behavioral: Denies suicidal ideation, mood changes, confusion, nervousness, sleep disturbance and agitation    Physical Exam: Vitals:   03/19/21 1056  BP: 130/80  Temp: 98.3 F (36.8 C)  TempSrc: Oral  Weight: 117 lb 1.6 oz (53.1  kg)    Body mass index is 22.87 kg/m.   Constitutional: NAD, calm, comfortable Eyes: PERRL, lids and conjunctivae normal, wears corrective lenses ENMT: Mucous membranes are moist.  Respiratory: clear to auscultation bilaterally, no wheezing, no crackles. Normal respiratory effort. No accessory muscle use.  Cardiovascular: Regular rate and rhythm, no murmurs / rubs / gallops. No extremity edema.  Psychiatric: Normal judgment and insight. Alert and oriented x 3. Normal mood.    Impression and Plan:  Hearing loss, unspecified hearing loss type, unspecified laterality  - Plan: Ambulatory referral to ENT  Crohn's disease of large intestine without complication ([place])  - Plan: dicyclomine (BENTYL) 20 MG tablet -Followed by GI.  Depression, recurrent ([place])  - Plan: DULoxetine (CYMBALTA) 60 MG capsule  Seasonal allergies  - Plan: fluticasone (FLONASE) 50 MCG/ACT nasal spray  Essential hypertension  - Plan: losartan (COZAAR) 100 MG tablet -Blood pressure is well controlled.  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  Time spent: 31 minutes reviewing chart, interviewing and examining patient and formulating plan of care.   Patient Instructions  -Nice seeing you today!!  -Valtrex 500 mg twice daily for 3 days when you have a cold sore.  -Schedule follow up in 6 months for your physical.    Lelon Frohlich, MD [facility]

## 2021-03-20 ENCOUNTER — Encounter: Payer: Self-pay | Admitting: Internal Medicine

## 2021-03-20 ENCOUNTER — Ambulatory Visit (INDEPENDENT_AMBULATORY_CARE_PROVIDER_SITE_OTHER): Payer: Medicare Other | Admitting: Internal Medicine

## 2021-03-20 VITALS — BP 120/70 | HR 76 | Ht 60.0 in | Wt 115.0 lb

## 2021-03-20 DIAGNOSIS — R197 Diarrhea, unspecified: Secondary | ICD-10-CM

## 2021-03-20 DIAGNOSIS — K50919 Crohn's disease, unspecified, with unspecified complications: Secondary | ICD-10-CM

## 2021-03-20 DIAGNOSIS — K5010 Crohn's disease of large intestine without complications: Secondary | ICD-10-CM | POA: Diagnosis not present

## 2021-03-20 DIAGNOSIS — K219 Gastro-esophageal reflux disease without esophagitis: Secondary | ICD-10-CM

## 2021-03-20 MED ORDER — DIPHENOXYLATE-ATROPINE 2.5-0.025 MG PO TABS: 1.0000 | ORAL_TABLET | Freq: Four times a day (QID) | ORAL | 1 refills | Status: DC | PRN

## 2021-03-20 MED ORDER — DICYCLOMINE HCL 20 MG PO TABS: 20.0000 mg | ORAL_TABLET | Freq: Four times a day (QID) | ORAL | 3 refills | Status: DC | PRN

## 2021-03-20 MED ORDER — COLESTIPOL HCL 1 G PO TABS: 2.0000 g | ORAL_TABLET | Freq: Two times a day (BID) | ORAL | 3 refills | Status: DC

## 2021-03-20 MED ORDER — OMEPRAZOLE 40 MG PO CPDR: 40.0000 mg | DELAYED_RELEASE_CAPSULE | Freq: Two times a day (BID) | ORAL | 3 refills | Status: DC

## 2021-03-20 MED ORDER — MESALAMINE 1.2 G PO TBEC: 4.8000 g | DELAYED_RELEASE_TABLET | Freq: Every day | ORAL | 3 refills | Status: DC

## 2021-03-20 NOTE — Patient Instructions (Signed)
If you are age 76 or older, your body mass index should be between 23-30. Your Body mass index is 22.46 kg/m?Marland Kitchen If this is out of the aforementioned range listed, please consider follow up with your Primary Care Provider. ? ?If you are age 40 or younger, your body mass index should be between 19-25. Your Body mass index is 22.46 kg/m?Marland Kitchen If this is out of the aformentioned range listed, please consider follow up with your Primary Care Provider.  ? ?________________________________________________________ ? ?The [facility] providers would like to encourage you to use [facility] to communicate with providers for non-urgent requests or questions.  Due to long hold times on the telephone, sending your provider a message by [facility] may be a faster and more efficient way to get a response.  Please allow 48 business hours for a response.  Please remember that this is for non-urgent requests.  ?_______________________________________________________ ? ?We have sent the following medications to your pharmacy for you to pick up at your convenience:  Colestid, Bentyl, Lialda, Prilosec ? ?Stay on your Citrucel ? ?Please follow up in 3 months ? ?

## 2021-03-20 NOTE — Progress Notes (Signed)
? ? ?Assessment/Plan:  ? ? ?1.  Tremor ? -No evidence of a neurodegenerative process.  Reassurance is provided. ? -Really have not seen any significant tremor and did not recommend any medication.  Patient does not disagree.  Discussed concerning symptoms that she would want to call us back for, if she developed any of these.  She was agreeable.  Otherwise, we will see the patient back on an as-needed basis. ? ?Subjective:  ? ?[name] was seen today in follow up for tremor.  My previous records were reviewed prior to todays visit.  Husband with her and supplements hx.   Last visit, 1 year ago, the patient was complaining about right hand tremor (she complained about action tremor), but we did not really see it on examination.  I saw very little rest tremor in the right thumb.  Ultimately, we decided to just follow it with the course of time.  I told them to call me if they had any concerns between last visit and this 1.  If not heard from them.  Today, they state that tremor is present when she holds something with the R hand.  Husband reports that it is intermittent.  She doesn't think that the L hand does that but "it may."  Patient did just see her primary care physician on March 13.  Those notes are reviewed. ? ? ? ?ALLERGIES:   ?Allergies  ?Allergen Reactions  ? Adhesive [Tape]   ?  Redness and skin peeling  ? Aspirin   ?  Intestinal Bleeding  ? Ciprofloxacin Nausea And Vomiting  ? Codeine Nausea And Vomiting  ? Demerol [Meperidine Hcl] Nausea And Vomiting  ? Lactose Intolerance (Gi) Other (See Comments)  ?  Pt has Crohn's   ? Latex   ?  Redness and skin peeling  ? Other   ?  Nuts-itching in throat  ?Seeds-stomach issues with chrons  ?  ? Septra [Sulfamethoxazole-Trimethoprim] Nausea Only  ? ? ?CURRENT MEDICATIONS:  ?Current Meds  ?Medication Sig  ? Calcium Citrate-Vitamin D (CALCIUM + D PO) Take 1 tablet by mouth 2 (two) times daily.  ? cetirizine (ZYRTEC) 10 MG tablet Take 1 tablet (10 mg total) by  mouth daily.  ? dicyclomine (BENTYL) 20 MG tablet Take 1 tablet (20 mg total) by mouth 4 (four) times daily as needed for spasms.  ? diphenoxylate-atropine (LOMOTIL) 2.5-0.025 MG tablet Take 1 tablet by mouth 4 (four) times daily as needed for diarrhea or loose stools.  ? DULoxetine (CYMBALTA) 60 MG capsule Take 1 capsule (60 mg total) by mouth 2 (two) times daily.  ? fluticasone (FLONASE) 50 MCG/ACT nasal spray Place 2 sprays into both nostrils daily.  ? loperamide (IMODIUM) 2 MG capsule Take 1 capsule (2 mg total) by mouth 3 (three) times daily as needed for diarrhea or loose stools.  ? losartan (COZAAR) 100 MG tablet Take 1 tablet (100 mg total) by mouth daily.  ? mesalamine (LIALDA) 1.2 g EC tablet Take 4 tablets (4.8 g total) by mouth daily with breakfast.  ? Misc Natural Product Nasal (NASAL CLEANSE RINSE MIX NA) Place 1 Dose into the nose daily as needed (congestion).  ? Multiple Vitamin (MULTIVITAMIN WITH MINERALS) TABS tablet Take 1 tablet by mouth daily.  ? omeprazole (PRILOSEC) 40 MG capsule Take 1 capsule (40 mg total) by mouth in the morning and at bedtime.  ? valACYclovir (VALTREX) 500 MG tablet Take 1 tablet (500 mg total) by mouth 2 (two) times daily for  3 days. When you have a cold sore  ? ? ? ? ?Objective:  ? ? ?PHYSICAL EXAMINATION:   ? ?VITALS:   ?Vitals:  ? 03/22/21 1053  ?BP: 132/90  ?Pulse: 67  ?SpO2: 98%  ?Weight: 116 lb (52.6 kg)  ?Height: 5' (1.524 m)  ? ? ?GEN:  The patient appears stated age and is in NAD. ?HEENT:  Normocephalic, atraumatic.  The mucous membranes are moist. The superficial temporal arteries are without ropiness or tenderness. ?CV:  RRR ?Lungs:  CTAB ?Neck/HEME:  There are no carotid bruits bilaterally. ? ?Neurological examination: ?Orientation: She is alert and oriented x3. ?Cranial nerves: There is good facial symmetry. The speech is fluent and clear. Soft palate rises symmetrically and there is no tongue deviation.  ?Sensation: Sensation is intact to light touch  throughout. ?Motor: Strength is 5/5 in the bilateral upper and lower extremities.   Shoulder shrug is equal and symmetric.  There is no pronator drift. ?Deep tendon reflexes: Deep tendon reflexes are 2/4 at the bilateral biceps, triceps, brachioradialis, 2- at the right patella and 2/4 at the left  and 1/4 at the bilateral achilles. Plantar responses are downgoing bilaterally. ?  ?Movement examination: ?Tone: There is normal tone in the bilateral upper extremities.  The tone in the lower extremities is normal.  ?Abnormal movements: Rare tremor in the right thumb, only with distraction procedures.  This is the same as last visit.  She has no postural tremor.  No intention tremor.  No trouble with Archimedes spirals on right or left. ?Coordination:  There is no decremation with RAM's, with any form of RAMS, including alternating supination and pronation of the forearm, hand opening and closing, finger taps, heel taps and toe taps. ?Gait and Station: The patient has minimal difficulty arising out of a deep-seated chair without the use of the hands. The patient's stride length is good but she is turned to the right (scoliosis).   ?I have reviewed and interpreted the following labs independently ?  Chemistry   ?   ?Component Value Date/Time  ? NA 137 09/19/2020 1136  ? K 3.9 09/19/2020 1136  ? CL 106 09/19/2020 1136  ? CO2 22 09/19/2020 1136  ? BUN 35 (H) 09/19/2020 1136  ? CREATININE 1.25 (H) 09/19/2020 1136  ? CREATININE 1.10 (H) 11/08/2019 1048  ?    ?Component Value Date/Time  ? CALCIUM 9.1 09/19/2020 1136  ? ALKPHOS 87 09/19/2020 1136  ? AST 13 09/19/2020 1136  ? ALT 10 09/19/2020 1136  ? BILITOT 0.4 09/19/2020 1136  ?  ? ? ?Lab Results  ?Component Value Date  ? WBC 11.7 (H) 09/19/2020  ? HGB 11.6 (L) 09/19/2020  ? HCT 35.4 (L) 09/19/2020  ? MCV 90.3 09/19/2020  ? PLT 458.0 (H) 09/19/2020  ? ?Lab Results  ?Component Value Date  ? TSH 1.80 03/22/2020  ? ?  Chemistry   ?   ?Component Value Date/Time  ? NA 137  09/19/2020 1136  ? K 3.9 09/19/2020 1136  ? CL 106 09/19/2020 1136  ? CO2 22 09/19/2020 1136  ? BUN 35 (H) 09/19/2020 1136  ? CREATININE 1.25 (H) 09/19/2020 1136  ? CREATININE 1.10 (H) 11/08/2019 1048  ?    ?Component Value Date/Time  ? CALCIUM 9.1 09/19/2020 1136  ? ALKPHOS 87 09/19/2020 1136  ? AST 13 09/19/2020 1136  ? ALT 10 09/19/2020 1136  ? BILITOT 0.4 09/19/2020 1136  ?  ? ? ? ? ? ?Total time spent on today's visit  was 30mnutes, including both face-to-face time and nonface-to-face time.  Time included that spent on review of records (prior notes available to me/labs/imaging if pertinent), discussing treatment and goals, answering patient's questions and coordinating care. ? ?Cc:  HIsaac Bliss ERayford Halsted MD ? ?

## 2021-03-20 NOTE — Progress Notes (Signed)
HISTORY OF PRESENT ILLNESS: ? ?[name] is a 76 y.o. female following issues who presents today for follow-up.  Last evaluated February 06, 2021. ? ?1.  Chronic diarrhea.  Multifactorial.  Has had a positive, but incomplete response to Colestid ?2.  History of Crohn's colitis, elsewhere.  Relatively recent complaints of rectal bleeding led to complete colonoscopy which was performed November 23, 2020.  She was found to have a normal ileum.  The colon revealed mild patchy colitis.  Biopsies confirmed mildly active chronic colitis consistent with IBD.  She was placed on budesonide and told to follow-up at this time. ?3.  Large symptomatic paraesophageal hernia.  Has had surgical evaluation.  Offered surgery.  Patient has declined at present. ?4.  GERD.  On omeprazole ?5.  IBS ? ?At the time of her last visit she declared that she was no better on budesonide.  She was told to taper off.  She has continued on Colestid and Imodium.  She reports to me having anywhere between 1 and 6 bowel movements per day.  Rarely formed.  She has been using Citrucel as recommended.  She thought initially this was helpful.  She inquires about biologic agents.  Really nothing new. ? ?REVIEW OF SYSTEMS: ? ?All non-GI ROS negative. ? ?Past Medical History:  ?Diagnosis Date  ? Allergies   ? Bruises easily   ? Complication of anesthesia   ? Crohn's disease ([place])   ? GI Dr Beulah Gandy in [place], [place]   ? Deviated septum   ? GERD (gastroesophageal reflux disease)   ? Headache   ? occ   ? Hiatal hernia   ? HTN (hypertension)   ? pcp Dr Margaretann Loveless  in [place], [place]   ? Kidney disease, chronic, stage III (GFR 30-59 ml/min) (HCC)   ? Neuropathy   ? fingers,periodically   ? Osteoarthritis   ? Pancreatic insufficiency   ? PONV (postoperative nausea and vomiting)   ? Rheumatoid arthritis ([place])   ? Scoliosis   ? 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 Whooping cough 2015  ? Whooping cough with pneumonia   ? 2017  ? ? ?Past  Surgical History:  ?Procedure Laterality Date  ? CATARACT EXTRACTION, BILATERAL  2018  ? with IOC implant   ? COLONOSCOPY    ? DEBRIDEMENT AND CLOSURE WOUND Right 10/01/2018  ? Procedure: Excision of right knee wound with placement of ACell and Pravena;  Surgeon: Wallace Going, DO;  Location: [facility];  Service: Plastics;  Laterality: Right;  30 min  ? JOINT REPLACEMENT  2020  ? right knee  ? knee fracture     ? NASAL FRACTURE SURGERY    ? needed b/c she was getting frequent sinus infections   ? TOTAL KNEE ARTHROPLASTY Right 07/28/2018  ? Procedure: TOTAL KNEE ARTHROPLASTY;  Surgeon: Paralee Cancel, MD;  Location: [facility];  Service: Orthopedics;  Laterality: Right;  ? TOTAL KNEE ARTHROPLASTY Left 03/02/2019  ? Procedure: TOTAL KNEE ARTHROPLASTY;  Surgeon: Paralee Cancel, MD;  Location: [facility];  Service: Orthopedics;  Laterality: Left;  70 mins  ? UPPER GASTROINTESTINAL ENDOSCOPY    ? ? ?Social History ?[name]  reports that she quit smoking about 33 years ago. Her smoking use included cigarettes. She has a 30.00 pack-year smoking history. She has never used smokeless tobacco. She reports that she does not currently use alcohol. She reports that she does not use drugs. ? ?  family history includes Colon cancer in an other family member; Diabetes in her father; Parkinson's disease in her father. ? ?Allergies  ?Allergen Reactions  ? Adhesive [Tape]   ?  Redness and skin peeling  ? Aspirin   ?  Intestinal Bleeding  ? Ciprofloxacin Nausea And Vomiting  ? Codeine Nausea And Vomiting  ? Demerol [Meperidine Hcl] Nausea And Vomiting  ? Lactose Intolerance (Gi) Other (See Comments)  ?  Pt has Crohn's   ? Latex   ?  Redness and skin peeling  ? Other   ?  Nuts-itching in throat  ?Seeds-stomach issues with chrons  ?  ? Septra [Sulfamethoxazole-Trimethoprim] Nausea Only  ? ? ?  ? ?PHYSICAL EXAMINATION: ?Vital signs: BP 120/70   Pulse 76   Ht 5' (1.524 m)   Wt 115 lb (52.2 kg)   BMI 22.46 kg/m?    ?Constitutional: generally well-appearing, no acute distress ?Psychiatric: alert and oriented x3, cooperative ?Eyes: extraocular movements intact, anicteric, conjunctiva pink ?Mouth: oral pharynx moist, no lesions ?Neck: supple no lymphadenopathy ?Cardiovascular: heart regular rate and rhythm, no murmur ?Lungs: clear to auscultation bilaterally ?Abdomen: soft, nontender, nondistended, no obvious ascites, no peritoneal signs, normal bowel sounds, no organomegaly ?Rectal: Omitted ?Extremities: no clubbing, cyanosis, or lower extremity edema bilaterally ?Skin: no lesions on visible extremities ?Neuro: No focal deficits.  Cranial nerves intact ? ?ASSESSMENT: ? ?1.  Chronic diarrhea.  Multifactorial.  Has had a positive, but incomplete response to Colestid ?2.  History of Crohn's colitis, elsewhere.  Relatively recent complaints of rectal bleeding led to complete colonoscopy which was performed November 23, 2020.  She was found to have a normal ileum.  The colon revealed mild patchy colitis.  Biopsies confirmed mildly active chronic colitis consistent with IBD.  She was placed on budesonide and told to follow-up at this time. ?3.  Large symptomatic paraesophageal hernia.  Has had surgical evaluation.  Offered surgery.  Patient has declined at present. ?4.  GERD.  On omeprazole ?5.  IBS ? ? ?PLAN: ? ?1.  Continue Citrucel, Colestid, Bentyl, Lialda, Prilosec.  All medications refilled ?2.  Prescribe Lomotil.  1 p.o. 4 times daily as needed ?3.  Office follow-up 3 months ?4.  Consider broad-spectrum antibiotic for possible bacterial overgrowth such as metronidazole or Xifaxan ?5.  Patient inquired about possible biologic therapy.  We discussed this briefly.  Could be considered.  I told her that this would be for IBD related symptoms as opposed IBS related symptoms ?6.  Office follow-up in 3 months ?A total time of 30 minutes was spent preparing to see the patient, obtaining history, performing medically appropriate  physical examination, counseling the patient regarding multiple above listed issues, ordering medications, arranging follow-up, and documenting clinical information in the health record ? ? ? ? ?  ?

## 2021-03-22 ENCOUNTER — Other Ambulatory Visit: Payer: Self-pay

## 2021-03-22 ENCOUNTER — Ambulatory Visit (INDEPENDENT_AMBULATORY_CARE_PROVIDER_SITE_OTHER): Payer: Medicare Other | Admitting: Neurology

## 2021-03-22 ENCOUNTER — Encounter: Payer: Self-pay | Admitting: Neurology

## 2021-03-22 VITALS — BP 132/90 | HR 67 | Ht 60.0 in | Wt 116.0 lb

## 2021-03-22 DIAGNOSIS — R251 Tremor, unspecified: Secondary | ICD-10-CM | POA: Diagnosis not present

## 2021-03-22 NOTE — Patient Instructions (Signed)
It was really good to see you today! ? ?The physicians and staff at [facility] are committed to providing excellent care. You may receive a survey requesting feedback about your experience at our office. We strive to receive "very good" responses to the survey questions. If you feel that your experience would prevent you from giving the office a "very good " response, please contact our office to try to remedy the situation. We may be reached at [phone]. Thank you for taking the time out of your busy day to complete the survey. ? ?

## 2021-03-27 ENCOUNTER — Other Ambulatory Visit: Payer: Self-pay | Admitting: Internal Medicine

## 2021-03-27 DIAGNOSIS — J302 Other seasonal allergic rhinitis: Secondary | ICD-10-CM

## 2021-03-27 MED ORDER — CETIRIZINE HCL 10 MG PO TABS: 10.0000 mg | ORAL_TABLET | Freq: Every day | ORAL | 1 refills | Status: DC

## 2021-03-27 NOTE — Telephone Encounter (Signed)
Zofran not on current medication list.  Okay to refill? ?

## 2021-03-27 NOTE — Telephone Encounter (Signed)
Patient called in requesting a refill for Zofran and cetirizine (ZYRTEC) 10 MG tablet [[id number]]  to be sent to her pharmacy. ? ?Please advise. ?

## 2021-05-02 ENCOUNTER — Emergency Department (HOSPITAL_COMMUNITY): Payer: Medicare Other

## 2021-05-02 ENCOUNTER — Inpatient Hospital Stay (HOSPITAL_COMMUNITY): Payer: Medicare Other

## 2021-05-02 ENCOUNTER — Inpatient Hospital Stay (HOSPITAL_COMMUNITY)
Admission: EM | Admit: 2021-05-02 | Discharge: 2021-05-05 | DRG: 551 | Disposition: A | Discharge status: Home Health | Payer: Medicare Other | Attending: Surgery | Admitting: Surgery

## 2021-05-02 ENCOUNTER — Encounter (HOSPITAL_COMMUNITY): Payer: Self-pay | Admitting: *Deleted

## 2021-05-02 DIAGNOSIS — Y92410 Unspecified street and highway as the place of occurrence of the external cause: Secondary | ICD-10-CM | POA: Diagnosis not present

## 2021-05-02 DIAGNOSIS — J9601 Acute respiratory failure with hypoxia: Secondary | ICD-10-CM | POA: Diagnosis present

## 2021-05-02 DIAGNOSIS — S12500A Unspecified displaced fracture of sixth cervical vertebra, initial encounter for closed fracture: Secondary | ICD-10-CM | POA: Diagnosis present

## 2021-05-02 DIAGNOSIS — S2220XA Unspecified fracture of sternum, initial encounter for closed fracture: Secondary | ICD-10-CM

## 2021-05-02 DIAGNOSIS — J9811 Atelectasis: Secondary | ICD-10-CM | POA: Diagnosis present

## 2021-05-02 DIAGNOSIS — Z79899 Other long term (current) drug therapy: Secondary | ICD-10-CM

## 2021-05-02 DIAGNOSIS — I169 Hypertensive crisis, unspecified: Secondary | ICD-10-CM

## 2021-05-02 DIAGNOSIS — M069 Rheumatoid arthritis, unspecified: Secondary | ICD-10-CM | POA: Diagnosis present

## 2021-05-02 DIAGNOSIS — H9190 Unspecified hearing loss, unspecified ear: Secondary | ICD-10-CM | POA: Diagnosis present

## 2021-05-02 DIAGNOSIS — Z974 Presence of external hearing-aid: Secondary | ICD-10-CM

## 2021-05-02 DIAGNOSIS — N183 Chronic kidney disease, stage 3 unspecified: Secondary | ICD-10-CM | POA: Diagnosis present

## 2021-05-02 DIAGNOSIS — S22019A Unspecified fracture of first thoracic vertebra, initial encounter for closed fracture: Secondary | ICD-10-CM | POA: Diagnosis present

## 2021-05-02 DIAGNOSIS — K449 Diaphragmatic hernia without obstruction or gangrene: Secondary | ICD-10-CM

## 2021-05-02 DIAGNOSIS — K219 Gastro-esophageal reflux disease without esophagitis: Secondary | ICD-10-CM | POA: Diagnosis present

## 2021-05-02 DIAGNOSIS — I129 Hypertensive chronic kidney disease with stage 1 through stage 4 chronic kidney disease, or unspecified chronic kidney disease: Secondary | ICD-10-CM | POA: Diagnosis present

## 2021-05-02 DIAGNOSIS — K8689 Other specified diseases of pancreas: Secondary | ICD-10-CM

## 2021-05-02 DIAGNOSIS — J438 Other emphysema: Secondary | ICD-10-CM

## 2021-05-02 DIAGNOSIS — E663 Overweight: Secondary | ICD-10-CM | POA: Diagnosis present

## 2021-05-02 DIAGNOSIS — E739 Lactose intolerance, unspecified: Secondary | ICD-10-CM | POA: Diagnosis present

## 2021-05-02 DIAGNOSIS — Z20822 Contact with and (suspected) exposure to covid-19: Secondary | ICD-10-CM | POA: Diagnosis present

## 2021-05-02 DIAGNOSIS — E785 Hyperlipidemia, unspecified: Secondary | ICD-10-CM | POA: Diagnosis present

## 2021-05-02 DIAGNOSIS — K5090 Crohn's disease, unspecified, without complications: Secondary | ICD-10-CM | POA: Diagnosis present

## 2021-05-02 DIAGNOSIS — M1990 Unspecified osteoarthritis, unspecified site: Secondary | ICD-10-CM | POA: Diagnosis present

## 2021-05-02 DIAGNOSIS — Z91040 Latex allergy status: Secondary | ICD-10-CM

## 2021-05-02 DIAGNOSIS — Z91048 Other nonmedicinal substance allergy status: Secondary | ICD-10-CM

## 2021-05-02 DIAGNOSIS — Z87891 Personal history of nicotine dependence: Secondary | ICD-10-CM

## 2021-05-02 DIAGNOSIS — Z85828 Personal history of other malignant neoplasm of skin: Secondary | ICD-10-CM

## 2021-05-02 DIAGNOSIS — T1490XA Injury, unspecified, initial encounter: Secondary | ICD-10-CM

## 2021-05-02 DIAGNOSIS — S12400A Unspecified displaced fracture of fifth cervical vertebra, initial encounter for closed fracture: Secondary | ICD-10-CM | POA: Diagnosis present

## 2021-05-02 DIAGNOSIS — J309 Allergic rhinitis, unspecified: Secondary | ICD-10-CM | POA: Diagnosis present

## 2021-05-02 DIAGNOSIS — Z6822 Body mass index (BMI) 22.0-22.9, adult: Secondary | ICD-10-CM | POA: Diagnosis not present

## 2021-05-02 DIAGNOSIS — C44621 Squamous cell carcinoma of skin of unspecified upper limb, including shoulder: Secondary | ICD-10-CM

## 2021-05-02 DIAGNOSIS — Z885 Allergy status to narcotic agent status: Secondary | ICD-10-CM

## 2021-05-02 DIAGNOSIS — Z96653 Presence of artificial knee joint, bilateral: Secondary | ICD-10-CM | POA: Diagnosis present

## 2021-05-02 DIAGNOSIS — J432 Centrilobular emphysema: Secondary | ICD-10-CM | POA: Diagnosis present

## 2021-05-02 DIAGNOSIS — I10 Essential (primary) hypertension: Secondary | ICD-10-CM

## 2021-05-02 DIAGNOSIS — Z888 Allergy status to other drugs, medicaments and biological substances status: Secondary | ICD-10-CM

## 2021-05-02 DIAGNOSIS — S22009A Unspecified fracture of unspecified thoracic vertebra, initial encounter for closed fracture: Secondary | ICD-10-CM

## 2021-05-02 DIAGNOSIS — M419 Scoliosis, unspecified: Secondary | ICD-10-CM | POA: Diagnosis present

## 2021-05-02 DIAGNOSIS — Z881 Allergy status to other antibiotic agents status: Secondary | ICD-10-CM

## 2021-05-02 DIAGNOSIS — G629 Polyneuropathy, unspecified: Secondary | ICD-10-CM | POA: Diagnosis present

## 2021-05-02 DIAGNOSIS — S129XXA Fracture of neck, unspecified, initial encounter: Secondary | ICD-10-CM

## 2021-05-02 DIAGNOSIS — Z886 Allergy status to analgesic agent status: Secondary | ICD-10-CM

## 2021-05-02 LAB — I-STAT CHEM 8, ED
BUN: 33 mg/dL — ABNORMAL HIGH (ref 8–23)
Calcium, Ion: 1.15 mmol/L (ref 1.15–1.40)
Chloride: 105 mmol/L (ref 98–111)
Creatinine, Ser: 1 mg/dL (ref 0.44–1.00)
Glucose, Bld: 108 mg/dL — ABNORMAL HIGH (ref 70–99)
HCT: 38 % (ref 36.0–46.0)
Hemoglobin: 12.9 g/dL (ref 12.0–15.0)
Potassium: 3.9 mmol/L (ref 3.5–5.1)
Sodium: 140 mmol/L (ref 135–145)
TCO2: 23 mmol/L (ref 22–32)

## 2021-05-02 LAB — CBC
HCT: 34.6 % — ABNORMAL LOW (ref 36.0–46.0)
Hemoglobin: 11.5 g/dL — ABNORMAL LOW (ref 12.0–15.0)
MCH: 30 pg (ref 26.0–34.0)
MCHC: 33.2 g/dL (ref 30.0–36.0)
MCV: 90.3 fL (ref 80.0–100.0)
Platelets: 395 10*3/uL (ref 150–400)
RBC: 3.83 MIL/uL — ABNORMAL LOW (ref 3.87–5.11)
RDW: 14.8 % (ref 11.5–15.5)
WBC: 12 10*3/uL — ABNORMAL HIGH (ref 4.0–10.5)
nRBC: 0 % (ref 0.0–0.2)

## 2021-05-02 LAB — COMPREHENSIVE METABOLIC PANEL
ALT: 20 U/L (ref 0–44)
AST: 22 U/L (ref 15–41)
Albumin: 3.3 g/dL — ABNORMAL LOW (ref 3.5–5.0)
Alkaline Phosphatase: 94 U/L (ref 38–126)
Anion gap: 9 (ref 5–15)
BUN: 28 mg/dL — ABNORMAL HIGH (ref 8–23)
CO2: 23 mmol/L (ref 22–32)
Calcium: 9.1 mg/dL (ref 8.9–10.3)
Chloride: 106 mmol/L (ref 98–111)
Creatinine, Ser: 1.03 mg/dL — ABNORMAL HIGH (ref 0.44–1.00)
GFR, Estimated: 57 mL/min — ABNORMAL LOW (ref >60)
Glucose, Bld: 109 mg/dL — ABNORMAL HIGH (ref 70–99)
Potassium: 3.7 mmol/L (ref 3.5–5.1)
Sodium: 138 mmol/L (ref 135–145)
Total Bilirubin: 0.4 mg/dL (ref 0.3–1.2)
Total Protein: 7.5 g/dL (ref 6.5–8.1)

## 2021-05-02 LAB — PROTIME-INR
INR: 1 (ref 0.8–1.2)
Prothrombin Time: 13.3 seconds (ref 11.4–15.2)

## 2021-05-02 LAB — SAMPLE TO BLOOD BANK

## 2021-05-02 LAB — LACTIC ACID, PLASMA: Lactic Acid, Venous: 1.4 mmol/L (ref 0.5–1.9)

## 2021-05-02 LAB — ETHANOL: Alcohol, Ethyl (B): <10 mg/dL (ref <10)

## 2021-05-02 LAB — RESP PANEL BY RT-PCR (FLU A&B, COVID) ARPGX2
Influenza A by PCR: NEGATIVE
Influenza B by PCR: NEGATIVE
SARS Coronavirus 2 by RT PCR: NEGATIVE

## 2021-05-02 MED ORDER — IOHEXOL 300 MG/ML  SOLN
100.0000 mL | Freq: Once | INTRAMUSCULAR | Status: AC | PRN
  Administered 2021-05-02: 100 mL via INTRAVENOUS

## 2021-05-02 MED ORDER — IOHEXOL 300 MG/ML  SOLN: 100.0000 mL | Freq: Once | INTRAMUSCULAR | Status: DC | PRN

## 2021-05-02 MED ORDER — FENTANYL CITRATE PF 50 MCG/ML IJ SOSY
50.0000 ug | PREFILLED_SYRINGE | Freq: Once | INTRAMUSCULAR | Status: DC
  Filled 2021-05-02 (×2): qty 1

## 2021-05-02 MED ORDER — ONDANSETRON HCL 4 MG/2ML IJ SOLN
4.0000 mg | Freq: Once | INTRAMUSCULAR | Status: AC
  Administered 2021-05-02: 4 mg via INTRAVENOUS
  Filled 2021-05-02: qty 2

## 2021-05-02 MED ORDER — SODIUM CHLORIDE 0.9 % IV BOLUS
1000.0000 mL | Freq: Once | INTRAVENOUS | Status: AC
  Administered 2021-05-02: 1000 mL via INTRAVENOUS

## 2021-05-02 MED ORDER — OXYCODONE HCL 5 MG PO TABS
5.0000 mg | ORAL_TABLET | Freq: Four times a day (QID) | ORAL | Status: DC | PRN
  Administered 2021-05-02: 10 mg via ORAL
  Filled 2021-05-02: qty 2

## 2021-05-02 MED ORDER — HYDROCODONE-ACETAMINOPHEN 5-325 MG PO TABS
1.0000 | ORAL_TABLET | Freq: Once | ORAL | Status: AC
  Administered 2021-05-02: 1 via ORAL
  Filled 2021-05-02: qty 1

## 2021-05-02 MED ORDER — ACETAMINOPHEN 500 MG PO TABS
1000.0000 mg | ORAL_TABLET | Freq: Four times a day (QID) | ORAL | Status: DC
Start: 2021-05-02 — End: 2021-05-05
  Administered 2021-05-02 – 2021-05-05 (×11): 1000 mg via ORAL
  Filled 2021-05-02 (×11): qty 2

## 2021-05-02 MED ORDER — IOHEXOL 350 MG/ML SOLN
60.0000 mL | Freq: Once | INTRAVENOUS | Status: AC | PRN
Start: 2021-05-02 — End: 2021-05-02
  Administered 2021-05-02: 60 mL via INTRAVENOUS

## 2021-05-02 MED ORDER — MORPHINE SULFATE (PF) 2 MG/ML IV SOLN
2.0000 mg | Freq: Once | INTRAVENOUS | Status: AC
  Administered 2021-05-02: 2 mg via INTRAVENOUS
  Filled 2021-05-02: qty 1

## 2021-05-02 NOTE — ED Notes (Signed)
EDP at [facility]. Pt alert, NAD, calm, interactive, resps e/u, speaking in clear complete sentences, c-collar remains. Husband at [facility].  ?

## 2021-05-02 NOTE — ED Notes (Signed)
Refuses bed pan or purewick and insists on walking to bathroom.  Ambulated to restroom with husband and nurse by her side and tolerated it well. ?

## 2021-05-02 NOTE — ED Notes (Signed)
Verbalizes can't have fentanyl, declined pain med at this time, lying flat, EDP present, xray into room.  ?

## 2021-05-02 NOTE — ED Notes (Signed)
Lying flat supine, tolerated portable xrays, c-collar remains, VSS, no respiratory distress, remains on 3L [place].  ?

## 2021-05-02 NOTE — ED Notes (Signed)
No changes, EDP into room. [facility] at [facility]. Radiology called for portable.  ?

## 2021-05-02 NOTE — ED Notes (Signed)
Alert, NAD, calm, interactive, resps e/u, speaking in clear complete sentences, VSS, skin W&D, MAEx4, apd soft NT, no obvious bruising. Sternum TTP. EDP at [facility].  ?
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
## 2021-05-02 NOTE — ED Notes (Signed)
Resting, c-collar in place, HOB 30 degrees, alert, NAD, calm, interactive, no changes, verbalizes pain increasing and would like something for pain. VSS. family  at [facility],  ?

## 2021-05-03 LAB — CBC
HCT: 30.9 % — ABNORMAL LOW (ref 36.0–46.0)
Hemoglobin: 9.9 g/dL — ABNORMAL LOW (ref 12.0–15.0)
MCH: 29.6 pg (ref 26.0–34.0)
MCHC: 32 g/dL (ref 30.0–36.0)
MCV: 92.2 fL (ref 80.0–100.0)
Platelets: 351 10*3/uL (ref 150–400)
RBC: 3.35 MIL/uL — ABNORMAL LOW (ref 3.87–5.11)
RDW: 14.9 % (ref 11.5–15.5)
WBC: 11.4 10*3/uL — ABNORMAL HIGH (ref 4.0–10.5)
nRBC: 0 % (ref 0.0–0.2)

## 2021-05-03 LAB — URINALYSIS, ROUTINE W REFLEX MICROSCOPIC
Bilirubin Urine: NEGATIVE
Glucose, UA: NEGATIVE mg/dL
Hgb urine dipstick: NEGATIVE
Ketones, ur: NEGATIVE mg/dL
Leukocytes,Ua: NEGATIVE
Nitrite: NEGATIVE
Protein, ur: NEGATIVE mg/dL
Specific Gravity, Urine: 1.045 — ABNORMAL HIGH (ref 1.005–1.030)
pH: 5 (ref 5.0–8.0)

## 2021-05-03 MED ORDER — LACTATED RINGERS IV SOLN: INTRAVENOUS | Status: DC

## 2021-05-03 MED ORDER — DIPHENOXYLATE-ATROPINE 2.5-0.025 MG PO TABS: 1.0000 | ORAL_TABLET | Freq: Four times a day (QID) | ORAL | Status: DC | PRN

## 2021-05-03 MED ORDER — FLUTICASONE PROPIONATE 50 MCG/ACT NA SUSP
2.0000 | Freq: Every day | NASAL | Status: DC
  Administered 2021-05-03 – 2021-05-05 (×3): 2 via NASAL
  Filled 2021-05-03: qty 16

## 2021-05-03 MED ORDER — LOSARTAN POTASSIUM 50 MG PO TABS
100.0000 mg | ORAL_TABLET | Freq: Every day | ORAL | Status: DC
  Administered 2021-05-03 – 2021-05-05 (×3): 100 mg via ORAL
  Filled 2021-05-03 (×3): qty 2

## 2021-05-03 MED ORDER — LIDOCAINE 5 % EX PTCH
1.0000 | MEDICATED_PATCH | CUTANEOUS | Status: DC
  Administered 2021-05-03 – 2021-05-05 (×3): 1 via TRANSDERMAL
  Filled 2021-05-03 (×3): qty 1

## 2021-05-03 MED ORDER — HYDRALAZINE HCL 20 MG/ML IJ SOLN
10.0000 mg | Freq: Four times a day (QID) | INTRAMUSCULAR | Status: DC | PRN
  Administered 2021-05-03: 10 mg via INTRAVENOUS
  Filled 2021-05-03: qty 1

## 2021-05-03 MED ORDER — LOPERAMIDE HCL 2 MG PO CAPS: 2.0000 mg | ORAL_CAPSULE | Freq: Three times a day (TID) | ORAL | Status: DC | PRN

## 2021-05-03 MED ORDER — ONDANSETRON 4 MG PO TBDP: 4.0000 mg | ORAL_TABLET | Freq: Four times a day (QID) | ORAL | Status: DC | PRN

## 2021-05-03 MED ORDER — ENOXAPARIN SODIUM 30 MG/0.3ML IJ SOSY
30.0000 mg | PREFILLED_SYRINGE | Freq: Two times a day (BID) | INTRAMUSCULAR | Status: DC
  Administered 2021-05-03 – 2021-05-05 (×5): 30 mg via SUBCUTANEOUS
  Filled 2021-05-03 (×5): qty 0.3

## 2021-05-03 MED ORDER — METHOCARBAMOL 500 MG PO TABS
1000.0000 mg | ORAL_TABLET | Freq: Three times a day (TID) | ORAL | Status: DC
  Administered 2021-05-03 – 2021-05-05 (×7): 1000 mg via ORAL
  Filled 2021-05-03 (×7): qty 2

## 2021-05-03 MED ORDER — MESALAMINE 1.2 G PO TBEC
4.8000 g | DELAYED_RELEASE_TABLET | Freq: Every day | ORAL | Status: DC
  Administered 2021-05-03 – 2021-05-05 (×3): 4.8 g via ORAL
  Filled 2021-05-03 (×3): qty 4

## 2021-05-03 MED ORDER — LORATADINE 10 MG PO TABS
10.0000 mg | ORAL_TABLET | Freq: Every day | ORAL | Status: DC
  Administered 2021-05-03 – 2021-05-05 (×3): 10 mg via ORAL
  Filled 2021-05-03 (×3): qty 1

## 2021-05-03 MED ORDER — METOPROLOL TARTRATE 5 MG/5ML IV SOLN
5.0000 mg | Freq: Four times a day (QID) | INTRAVENOUS | Status: DC | PRN
  Administered 2021-05-03: 5 mg via INTRAVENOUS
  Filled 2021-05-03: qty 5

## 2021-05-03 MED ORDER — DICYCLOMINE HCL 20 MG PO TABS
20.0000 mg | ORAL_TABLET | Freq: Four times a day (QID) | ORAL | Status: DC | PRN
  Filled 2021-05-03: qty 1

## 2021-05-03 MED ORDER — KETOROLAC TROMETHAMINE 15 MG/ML IJ SOLN: 15.0000 mg | Freq: Four times a day (QID) | INTRAMUSCULAR | Status: DC

## 2021-05-03 MED ORDER — OXYCODONE HCL 5 MG PO TABS
2.5000 mg | ORAL_TABLET | Freq: Four times a day (QID) | ORAL | Status: DC | PRN
  Administered 2021-05-03 – 2021-05-04 (×3): 5 mg via ORAL
  Filled 2021-05-03 (×3): qty 1

## 2021-05-03 MED ORDER — MELATONIN 3 MG PO TABS
3.0000 mg | ORAL_TABLET | Freq: Every evening | ORAL | Status: DC | PRN
  Administered 2021-05-03 – 2021-05-04 (×2): 3 mg via ORAL
  Filled 2021-05-03 (×2): qty 1

## 2021-05-03 MED ORDER — ONDANSETRON HCL 4 MG/2ML IJ SOLN
4.0000 mg | Freq: Four times a day (QID) | INTRAMUSCULAR | Status: DC | PRN
  Administered 2021-05-03: 4 mg via INTRAVENOUS
  Filled 2021-05-03: qty 2

## 2021-05-03 MED ORDER — PROCHLORPERAZINE EDISYLATE 10 MG/2ML IJ SOLN
5.0000 mg | Freq: Four times a day (QID) | INTRAMUSCULAR | Status: DC | PRN
  Administered 2021-05-03: 10 mg via INTRAVENOUS
  Filled 2021-05-03: qty 2

## 2021-05-03 MED ORDER — COLESTIPOL HCL 1 G PO TABS
2.0000 g | ORAL_TABLET | Freq: Two times a day (BID) | ORAL | Status: DC
  Administered 2021-05-03 – 2021-05-05 (×5): 2 g via ORAL
  Filled 2021-05-03 (×6): qty 2

## 2021-05-03 MED ORDER — PANTOPRAZOLE SODIUM 40 MG PO TBEC
40.0000 mg | DELAYED_RELEASE_TABLET | Freq: Every day | ORAL | Status: DC
Start: 2021-05-03 — End: 2021-05-05
  Administered 2021-05-03 – 2021-05-05 (×3): 40 mg via ORAL
  Filled 2021-05-03 (×3): qty 1

## 2021-05-03 MED ORDER — HYDROMORPHONE HCL 1 MG/ML IJ SOLN
0.5000 mg | INTRAMUSCULAR | Status: DC | PRN
  Filled 2021-05-03: qty 0.5
  Filled 2021-05-03: qty 1

## 2021-05-03 MED ORDER — OYSTER SHELL CALCIUM/D3 500-5 MG-MCG PO TABS
1.0000 | ORAL_TABLET | Freq: Two times a day (BID) | ORAL | Status: DC
  Administered 2021-05-03 – 2021-05-05 (×5): 1 via ORAL
  Filled 2021-05-03 (×5): qty 1

## 2021-05-03 MED ORDER — DULOXETINE HCL 60 MG PO CPEP
60.0000 mg | ORAL_CAPSULE | Freq: Two times a day (BID) | ORAL | Status: DC
  Administered 2021-05-03 – 2021-05-05 (×5): 60 mg via ORAL
  Filled 2021-05-03 (×5): qty 1
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
## 2021-05-03 NOTE — ED Notes (Signed)
Dr white called   reason pt has on a c-collar that is not fitted to her and she only has fentanyl for pain other than thew oxycidone that was given already  dr white reports evewrything I asked about is in the signed and held orders  I rekeased the orders that were available  no reference to the type c-collar or other meds   than the ones  I listed ?

## 2021-05-03 NOTE — Progress Notes (Signed)
Mobility Specialist: Progress Note ? ? 05/03/21 1521  ?Mobility  ?Activity Refused mobility  ? ?Pt refused mobility secondary to pain. Will f/u as able.  ? ?[name] ?Mobility Specialist ?Mobility Specialist [place]: [phone] ?Mobility Specialist [place]: [phone] ? ?
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
## 2021-05-03 NOTE — TOC CAGE-AID Note (Signed)
Transition of Care (TOC) - CAGE-AID Screening ? ? ?Patient Details  ?Name: [name] ?MRN: [id number] ?Date of Birth: 1945/07/15 ? ?Transition of Care (TOC) CM/SW Contact:    ?[place], LCSW ?Phone Number: ?05/03/2021, 10:11 AM ? ? ?Clinical Narrative: ? ?CAGE-AID completed; score of 0. Pt denies alcohol or other drug use.  ? ?CAGE-AID Screening: ?  ? ?Have You Ever Felt You Ought to Cut Down on Your Drinking or Drug Use?: No ?Have People Annoyed You By Critizing Your Drinking Or Drug Use?: No ?Have You Felt Bad Or Guilty About Your Drinking Or Drug Use?: No ?Have You Ever Had a Drink or Used Drugs First Thing In The Morning to Steady Your Nerves or to Get Rid of a Hangover?: No ?CAGE-AID Score: 0 ? ?Substance Abuse Education Offered: No ? ?  ? ? ? ? ? ? ?
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
## 2021-05-03 NOTE — ED Notes (Signed)
Pt refused dilaudid  she does not want any narcotic meds ?

## 2021-05-04 ENCOUNTER — Inpatient Hospital Stay (HOSPITAL_COMMUNITY): Payer: Medicare Other

## 2021-05-04 ENCOUNTER — Other Ambulatory Visit: Payer: Self-pay

## 2021-05-04 ENCOUNTER — Telehealth: Payer: Self-pay | Admitting: Acute Care

## 2021-05-04 DIAGNOSIS — S22009A Unspecified fracture of unspecified thoracic vertebra, initial encounter for closed fracture: Secondary | ICD-10-CM

## 2021-05-04 DIAGNOSIS — J432 Centrilobular emphysema: Secondary | ICD-10-CM | POA: Diagnosis not present

## 2021-05-04 DIAGNOSIS — K8689 Other specified diseases of pancreas: Secondary | ICD-10-CM

## 2021-05-04 DIAGNOSIS — I169 Hypertensive crisis, unspecified: Secondary | ICD-10-CM

## 2021-05-04 DIAGNOSIS — J9601 Acute respiratory failure with hypoxia: Secondary | ICD-10-CM

## 2021-05-04 DIAGNOSIS — I10 Essential (primary) hypertension: Secondary | ICD-10-CM

## 2021-05-04 DIAGNOSIS — K449 Diaphragmatic hernia without obstruction or gangrene: Secondary | ICD-10-CM

## 2021-05-04 DIAGNOSIS — S129XXA Fracture of neck, unspecified, initial encounter: Secondary | ICD-10-CM

## 2021-05-04 DIAGNOSIS — M1990 Unspecified osteoarthritis, unspecified site: Secondary | ICD-10-CM

## 2021-05-04 DIAGNOSIS — K219 Gastro-esophageal reflux disease without esophagitis: Secondary | ICD-10-CM

## 2021-05-04 DIAGNOSIS — S2220XA Unspecified fracture of sternum, initial encounter for closed fracture: Secondary | ICD-10-CM

## 2021-05-04 DIAGNOSIS — K5090 Crohn's disease, unspecified, without complications: Secondary | ICD-10-CM

## 2021-05-04 DIAGNOSIS — C44621 Squamous cell carcinoma of skin of unspecified upper limb, including shoulder: Secondary | ICD-10-CM

## 2021-05-04 MED ORDER — DIPHENOXYLATE-ATROPINE 2.5-0.025 MG PO TABS: 1.0000 | ORAL_TABLET | Freq: Four times a day (QID) | ORAL | Status: DC | PRN

## 2021-05-04 MED ORDER — FLUTICASONE FUROATE-VILANTEROL 200-25 MCG/ACT IN AEPB
1.0000 | INHALATION_SPRAY | Freq: Every day | RESPIRATORY_TRACT | Status: DC
  Filled 2021-05-04: qty 28

## 2021-05-04 MED ORDER — FLUTICASONE FUROATE-VILANTEROL 200-25 MCG/ACT IN AEPB: 1.0000 | INHALATION_SPRAY | Freq: Every day | RESPIRATORY_TRACT | Status: DC

## 2021-05-04 MED ORDER — LOPERAMIDE HCL 2 MG PO CAPS: 2.0000 mg | ORAL_CAPSULE | Freq: Three times a day (TID) | ORAL | Status: DC | PRN

## 2021-05-04 MED ORDER — ALBUTEROL SULFATE (2.5 MG/3ML) 0.083% IN NEBU: 2.5000 mg | INHALATION_SOLUTION | Freq: Four times a day (QID) | RESPIRATORY_TRACT | Status: DC | PRN

## 2021-05-04 MED ORDER — UMECLIDINIUM-VILANTEROL 62.5-25 MCG/ACT IN AEPB: 1.0000 | INHALATION_SPRAY | Freq: Every day | RESPIRATORY_TRACT | Status: DC

## 2021-05-04 MED ORDER — UMECLIDINIUM BROMIDE 62.5 MCG/ACT IN AEPB
1.0000 | INHALATION_SPRAY | Freq: Every day | RESPIRATORY_TRACT | Status: DC
  Filled 2021-05-04: qty 7
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
## 2021-05-04 NOTE — Progress Notes (Signed)
SATURATION QUALIFICATIONS: (This note is used to comply with regulatory documentation for home oxygen) ? ?Patient Saturations on Room Air at Rest = 80% ? ?Patient Saturations on Room Air while Ambulating = 80% ? ?Patient Saturations on 2 Liters of oxygen while Ambulating = 92% ? ?Please briefly explain why patient needs home oxygen: Pt consistently desaturates at rest when on room air. Pt requires supplemental oxygen to ambulate and maintain oxygen saturation. Pt does report fatigue and feeling winded when negotiating steps and with longer distances of ambulation even on supplemental oxygen at this time. ? ?Zenaida Niece, PT, DPT ?Acute Rehabilitation ?Pager: [phone] ?Office [phone] ? ?
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
## 2021-05-05 MED ORDER — OXYCODONE HCL 5 MG PO TABS: 5.0000 mg | ORAL_TABLET | Freq: Three times a day (TID) | ORAL | 0 refills | Status: DC | PRN

## 2021-05-05 NOTE — Progress Notes (Signed)
Mobility Specialist Progress Note  ? ? 05/05/21 1245  ?Mobility  ?Activity Ambulated with assistance in hallway  ?Level of Assistance Contact guard assist, steadying assist  ?Assistive Device Other (Comment) ?(HHA)  ?Distance Ambulated (ft) 400 ft  ?Activity Response Tolerated well  ?$Mobility charge 1 Mobility  ? ?Pt received in chair and agreeable. No complaints on walk. Returned to chair with call bell in reach.   ? ?Hildred Alamin ?Mobility Specialist  ?Primary: 5N M.S. Phone: [phone] ?Secondary: 6N M.S. Phone: [phone] ?  ?
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
## 2021-05-05 NOTE — Progress Notes (Signed)
SATURATION QUALIFICATIONS: (This note is used to comply with regulatory documentation for home oxygen) ? ?Patient Saturations on Room Air at Rest = 95% ? ?Patient Saturations on Room Air while Ambulating and climbing stairs = 80% ? ?Patient Saturations on 2 Liters of oxygen while Ambulating = 95% ? ?Please briefly explain why patient needs home oxygen: Pt unable to maintain SpO2 > 90% on RA with exertion of walking or climbing stairs.  ? ?West Carbo, PT, DPT  ? ?Acute Rehabilitation Department ?Pager #: [phone] - [phone] ?

## 2021-05-05 NOTE — TOC Transition Note (Signed)
Transition of Care (TOC) - CM/SW Discharge Note ? ? ?Patient Details  ?Name: [name] ?MRN: [id number] ?Date of Birth: 1945/10/09 ? ?Transition of Care (TOC) CM/SW Contact:  ?Bartholomew Crews, RN ?Phone Number: [phone] ?05/05/2021, 1:07 PM ? ? ?Clinical Narrative:    ? ?Patient to transition home today. Previous TOC RNCM had arranged for home oxygen and HH PT/OT. Contacted Jasmine at [facility] of transition home today - [employer] to deliver home oxygen to bedside this afternoon. Contacted Cory at [facility] of transition home today. No further TOC needs identified at this time.  ? ?Final next level of care: [place] ?Barriers to Discharge: No Barriers Identified ? ? ?Patient Goals and CMS Choice ?Patient states their goals for this hospitalization and ongoing recovery are:: Return home ?CMS Medicare.gov Compare Post Acute Care list provided to:: Patient ?Choice offered to / list presented to : Patient ? ?Discharge Placement ?  ?           ?  ?  ?  ?  ? ?Discharge Plan and Services ?  ?Discharge Planning Services: CM Consult ?Post Acute Care Choice: Home Health          ?DME Arranged: Oxygen ?DME Agency: [facility] ?Date DME Agency Contacted: 05/05/21 ?Time DME Agency Contacted: 1601 ?Representative spoke with at DME Agency: Delana Meyer ?HH Arranged: PT, OT ?[place] Agency: [facility] ?Date HH Agency Contacted: 05/05/21 ?Time [place]: 0932 ?Representative spoke with at [place]: Tommi Rumps ? ?Social Determinants of Health (SDOH) Interventions ?  ? ? ?Readmission Risk Interventions ?   ? View : No data to display.  ?  ?  ?  ? ? ? ? ? ?

## 2021-05-22 ENCOUNTER — Encounter: Payer: Self-pay | Admitting: Internal Medicine

## 2021-05-22 ENCOUNTER — Ambulatory Visit (INDEPENDENT_AMBULATORY_CARE_PROVIDER_SITE_OTHER): Payer: Medicare Other | Admitting: Internal Medicine

## 2021-05-22 VITALS — BP 132/78 | HR 82 | Temp 97.7°F | Ht 60.0 in | Wt 112.5 lb

## 2021-05-22 DIAGNOSIS — R634 Abnormal weight loss: Secondary | ICD-10-CM
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
## 2021-06-02 ENCOUNTER — Other Ambulatory Visit: Payer: Self-pay | Admitting: Internal Medicine

## 2021-06-05 ENCOUNTER — Telehealth: Payer: Self-pay | Admitting: Internal Medicine

## 2021-06-05 NOTE — Telephone Encounter (Signed)
Inbound call from patient stating she needs a refill for Lomotil. Please advise.

## 2021-06-06 NOTE — Telephone Encounter (Signed)
Lomotil refill faxed to pharmacy

## 2021-06-19 ENCOUNTER — Other Ambulatory Visit: Payer: Self-pay | Admitting: Student

## 2021-06-19 ENCOUNTER — Other Ambulatory Visit (HOSPITAL_COMMUNITY): Payer: Self-pay | Admitting: Student

## 2021-06-19 DIAGNOSIS — S22010A Wedge compression fracture of first thoracic vertebra, initial encounter for closed fracture: Secondary | ICD-10-CM

## 2021-06-20 ENCOUNTER — Telehealth: Payer: Medicare Other

## 2021-06-27 ENCOUNTER — Encounter: Payer: Self-pay | Admitting: Internal Medicine

## 2021-06-27 ENCOUNTER — Other Ambulatory Visit: Payer: Medicare Other

## 2021-06-27 ENCOUNTER — Institutional Professional Consult (permissible substitution): Payer: Medicare Other | Admitting: Pulmonary Disease

## 2021-06-27 ENCOUNTER — Ambulatory Visit (INDEPENDENT_AMBULATORY_CARE_PROVIDER_SITE_OTHER): Payer: Medicare Other | Admitting: Internal Medicine

## 2021-06-27 VITALS — BP 130/80 | HR 108 | Ht 59.5 in | Wt 111.5 lb

## 2021-06-27 DIAGNOSIS — K219 Gastro-esophageal reflux disease without esophagitis: Secondary | ICD-10-CM | POA: Diagnosis not present

## 2021-06-27 DIAGNOSIS — R197 Diarrhea, unspecified: Secondary | ICD-10-CM | POA: Diagnosis not present

## 2021-06-27 DIAGNOSIS — K589 Irritable bowel syndrome without diarrhea: Secondary | ICD-10-CM

## 2021-06-27 DIAGNOSIS — K50919 Crohn's disease, unspecified, with unspecified complications: Secondary | ICD-10-CM | POA: Diagnosis not present

## 2021-06-27 DIAGNOSIS — K5010 Crohn's disease of large intestine without complications: Secondary | ICD-10-CM

## 2021-06-27 MED ORDER — DICYCLOMINE HCL 20 MG PO TABS: 20.0000 mg | ORAL_TABLET | Freq: Four times a day (QID) | ORAL | 3 refills | Status: DC | PRN

## 2021-06-27 MED ORDER — DIPHENOXYLATE-ATROPINE 2.5-0.025 MG PO TABS
2.0000 | ORAL_TABLET | Freq: Three times a day (TID) | ORAL | 6 refills | Status: DC
Start: 2021-06-27 — End: 2021-08-08

## 2021-06-27 MED ORDER — METRONIDAZOLE 250 MG PO TABS: 250.0000 mg | ORAL_TABLET | Freq: Three times a day (TID) | ORAL | 0 refills | Status: DC

## 2021-06-27 MED ORDER — COLESTIPOL HCL 1 G PO TABS: 2.0000 g | ORAL_TABLET | Freq: Two times a day (BID) | ORAL | 3 refills | Status: DC

## 2021-06-27 MED ORDER — MESALAMINE 1.2 G PO TBEC: 4.8000 g | DELAYED_RELEASE_TABLET | Freq: Every day | ORAL | 3 refills | Status: DC

## 2021-06-27 MED ORDER — OMEPRAZOLE 40 MG PO CPDR: 40.0000 mg | DELAYED_RELEASE_CAPSULE | Freq: Two times a day (BID) | ORAL | 3 refills | Status: DC
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
## 2021-06-27 NOTE — Patient Instructions (Signed)
If you are age 76 or older, your body mass index should be between 23-30. Your Body mass index is 22.14 kg/m. If this is out of the aforementioned range listed, please consider follow up with your Primary Care Provider.  If you are age 29 or younger, your body mass index should be between 19-25. Your Body mass index is 22.14 kg/m. If this is out of the aformentioned range listed, please consider follow up with your Primary Care Provider.   ________________________________________________________  The [facility] providers would like to encourage you to use [facility] to communicate with providers for non-urgent requests or questions.  Due to long hold times on the telephone, sending your provider a message by [facility] may be a faster and more efficient way to get a response.  Please allow 48 business hours for a response.  Please remember that this is for non-urgent requests.  _______________________________________________________  Your provider has requested that you go to the basement level for lab work before leaving today. Press "B" on the elevator. The lab is located at the first door on the left as you exit the elevator.  We have sent the following medications to your pharmacy for you to pick up at your convenience:Omeprazole, Bentyl, Lialda, Lomotil, Colestid, Flagyl  Please follow up on ___________________________

## 2021-06-28 ENCOUNTER — Other Ambulatory Visit: Payer: Medicare Other

## 2021-06-28 DIAGNOSIS — K589 Irritable bowel syndrome without diarrhea: Secondary | ICD-10-CM

## 2021-06-28 DIAGNOSIS — K5010 Crohn's disease of large intestine without complications: Secondary | ICD-10-CM

## 2021-06-28 DIAGNOSIS — K50919 Crohn's disease, unspecified, with unspecified complications: Secondary | ICD-10-CM

## 2021-06-28 DIAGNOSIS — R197 Diarrhea, unspecified: Secondary | ICD-10-CM

## 2021-06-28 DIAGNOSIS — K219 Gastro-esophageal reflux disease without esophagitis: Secondary | ICD-10-CM

## 2021-06-29 ENCOUNTER — Encounter: Payer: Self-pay | Admitting: Pulmonary Disease

## 2021-06-29 ENCOUNTER — Ambulatory Visit (INDEPENDENT_AMBULATORY_CARE_PROVIDER_SITE_OTHER): Payer: Medicare Other | Admitting: Pulmonary Disease

## 2021-06-29 VITALS — BP 126/62 | HR 85 | Ht 59.5 in | Wt 110.0 lb

## 2021-06-29 DIAGNOSIS — J432 Centrilobular emphysema: Secondary | ICD-10-CM

## 2021-06-29 DIAGNOSIS — K449 Diaphragmatic hernia without obstruction or gangrene: Secondary | ICD-10-CM | POA: Diagnosis not present

## 2021-07-02 LAB — CLOSTRIDIUM DIFFICILE TOXIN B, QUALITATIVE, REAL-TIME PCR: Toxigenic C. Difficile by PCR: NOT DETECTED

## 2021-07-05 ENCOUNTER — Other Ambulatory Visit (HOSPITAL_COMMUNITY): Payer: Self-pay | Admitting: Neurological Surgery

## 2021-07-05 ENCOUNTER — Other Ambulatory Visit: Payer: Self-pay | Admitting: Neurological Surgery

## 2021-07-05 ENCOUNTER — Telehealth: Payer: Self-pay | Admitting: Internal Medicine

## 2021-07-05 DIAGNOSIS — M542 Cervicalgia: Secondary | ICD-10-CM

## 2021-07-05 NOTE — Telephone Encounter (Signed)
Let pt know that her Cdiff result was negative.

## 2021-07-05 NOTE — Telephone Encounter (Signed)
Inbound call from patient inquiring about lab results please give patient a call back to advise.  Thank you

## 2021-07-06 ENCOUNTER — Ambulatory Visit (HOSPITAL_COMMUNITY)
Admission: RE | Admit: 2021-07-06 | Discharge: 2021-07-06 | Disposition: A | Discharge status: Home / Self Care | Payer: Medicare Other | Source: Ambulatory Visit | Attending: Neurological Surgery | Admitting: Neurological Surgery

## 2021-07-06 ENCOUNTER — Ambulatory Visit (HOSPITAL_COMMUNITY)
Admission: RE | Admit: 2021-07-06 | Discharge: 2021-07-06 | Disposition: A | Discharge status: Home / Self Care | Payer: Medicare Other | Source: Ambulatory Visit | Attending: Student | Admitting: Student

## 2021-07-06 DIAGNOSIS — M542 Cervicalgia: Secondary | ICD-10-CM | POA: Diagnosis present

## 2021-07-06 DIAGNOSIS — S22010A Wedge compression fracture of first thoracic vertebra, initial encounter for closed fracture: Secondary | ICD-10-CM | POA: Insufficient documentation

## 2021-07-08 ENCOUNTER — Emergency Department (HOSPITAL_BASED_OUTPATIENT_CLINIC_OR_DEPARTMENT_OTHER)
Admission: EM | Admit: 2021-07-08 | Discharge: 2021-07-08 | Disposition: A | Discharge status: Home / Self Care | Payer: Medicare Other | Attending: Emergency Medicine | Admitting: Emergency Medicine

## 2021-07-08 ENCOUNTER — Encounter (HOSPITAL_BASED_OUTPATIENT_CLINIC_OR_DEPARTMENT_OTHER): Payer: Self-pay | Admitting: Emergency Medicine

## 2021-07-08 ENCOUNTER — Other Ambulatory Visit: Payer: Self-pay

## 2021-07-08 DIAGNOSIS — M25511 Pain in right shoulder: Secondary | ICD-10-CM | POA: Diagnosis present

## 2021-07-08 DIAGNOSIS — Z91040 Latex allergy status: Secondary | ICD-10-CM | POA: Diagnosis not present

## 2021-07-08 DIAGNOSIS — M5412 Radiculopathy, cervical region: Secondary | ICD-10-CM | POA: Insufficient documentation

## 2021-07-08 MED ORDER — CYCLOBENZAPRINE HCL 10 MG PO TABS
10.0000 mg | ORAL_TABLET | Freq: Two times a day (BID) | ORAL | 0 refills | Status: DC | PRN
Start: 2021-07-08 — End: 2021-08-08

## 2021-07-08 MED ORDER — KETOROLAC TROMETHAMINE 30 MG/ML IJ SOLN
30.0000 mg | Freq: Once | INTRAMUSCULAR | Status: AC
  Administered 2021-07-08: 30 mg via INTRAMUSCULAR
  Filled 2021-07-08: qty 1

## 2021-07-08 MED ORDER — DEXAMETHASONE SODIUM PHOSPHATE 10 MG/ML IJ SOLN
10.0000 mg | Freq: Once | INTRAMUSCULAR | Status: AC
  Administered 2021-07-08: 10 mg via INTRAMUSCULAR
  Filled 2021-07-08: qty 1

## 2021-07-08 MED ORDER — HYDROCODONE-ACETAMINOPHEN 5-325 MG PO TABS: 2.0000 | ORAL_TABLET | Freq: Four times a day (QID) | ORAL | 0 refills | Status: DC | PRN

## 2021-07-08 MED ORDER — KETOROLAC TROMETHAMINE 30 MG/ML IJ SOLN: 30.0000 mg | Freq: Once | INTRAMUSCULAR | Status: DC
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
## 2021-07-08 NOTE — Discharge Instructions (Addendum)
Please do not take Flexeril and Norco at the same time.  These medicines are both sedatives, and can make you very groggy.  You should space these medicines out at least 6 hours in between.  Please follow-up with your neurosurgeon this week
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
## 2021-07-17 ENCOUNTER — Ambulatory Visit (INDEPENDENT_AMBULATORY_CARE_PROVIDER_SITE_OTHER): Payer: Medicare Other | Admitting: Internal Medicine

## 2021-07-17 ENCOUNTER — Encounter: Payer: Self-pay | Admitting: Internal Medicine

## 2021-07-17 VITALS — BP 110/68 | HR 120 | Temp 97.5°F | Wt 103.7 lb

## 2021-07-17 DIAGNOSIS — K8689 Other specified diseases of pancreas: Secondary | ICD-10-CM

## 2021-07-17 DIAGNOSIS — B001 Herpesviral vesicular dermatitis: Secondary | ICD-10-CM

## 2021-07-17 DIAGNOSIS — I10 Essential (primary) hypertension: Secondary | ICD-10-CM

## 2021-07-17 DIAGNOSIS — K5090 Crohn's disease, unspecified, without complications: Secondary | ICD-10-CM

## 2021-07-17 DIAGNOSIS — R634 Abnormal weight loss: Secondary | ICD-10-CM

## 2021-07-17 DIAGNOSIS — E785 Hyperlipidemia, unspecified: Secondary | ICD-10-CM

## 2021-07-17 MED ORDER — VALACYCLOVIR HCL 1 G PO TABS: 1000.0000 mg | ORAL_TABLET | Freq: Two times a day (BID) | ORAL | 0 refills | Status: DC | PRN
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
## 2021-07-20 ENCOUNTER — Ambulatory Visit (HOSPITAL_COMMUNITY): Payer: No Typology Code available for payment source

## 2021-07-20 ENCOUNTER — Encounter (HOSPITAL_COMMUNITY): Payer: Self-pay

## 2021-07-25 ENCOUNTER — Telehealth: Payer: Self-pay | Admitting: Internal Medicine

## 2021-07-25 NOTE — Telephone Encounter (Signed)
Patient called and said she was still having severe diarrhea even with the addition of the extra Lomotil.  She requested to see Dr. Henrene Pastor sooner than her 8/25 appointment she has scheduled and also what she should be doing in the meantime if a sooner appointment is not available.  Please call patient and advise.  Thank you.

## 2021-07-25 NOTE — Telephone Encounter (Signed)
Pts appt moved to 08/08/21 at 9am. Pt aware of appt.

## 2021-07-31 ENCOUNTER — Telehealth: Payer: Self-pay | Admitting: Neurology

## 2021-07-31 NOTE — Telephone Encounter (Signed)
Patient called and stated her right hand is shaking very bad and her left foot is numb.  She was last here in March and it has gotten a lot worse.  She didn't know if she could come in for an appointment.

## 2021-08-01 NOTE — Telephone Encounter (Signed)
Patient returned call and stated last Thursday her left foot became numb and has not gone away. Patient also states that she has been having "shaking" for 3-5 years and all of the sudden it has gotten worse. Patient states that holding things and eating has become difficult. Patient reports no other new symptoms.

## 2021-08-01 NOTE — Telephone Encounter (Signed)
Called patient and left a message for a call back.  

## 2021-08-01 NOTE — Telephone Encounter (Signed)
Called patient and informed her of Dr. Doristine Devoid recommendations. Patient will call us back to scheduled an appt with Dr. Carles Collet for her shaking. Patient will also contact her PCP about her foot numbness. Patient had no further questions or concerns.

## 2021-08-08 ENCOUNTER — Other Ambulatory Visit (INDEPENDENT_AMBULATORY_CARE_PROVIDER_SITE_OTHER): Payer: Medicare Other

## 2021-08-08 ENCOUNTER — Ambulatory Visit (INDEPENDENT_AMBULATORY_CARE_PROVIDER_SITE_OTHER): Payer: Medicare Other | Admitting: Internal Medicine

## 2021-08-08 ENCOUNTER — Encounter: Payer: Self-pay | Admitting: Internal Medicine

## 2021-08-08 ENCOUNTER — Other Ambulatory Visit: Payer: Self-pay

## 2021-08-08 VITALS — BP 116/64 | HR 67 | Ht 60.0 in | Wt 108.0 lb

## 2021-08-08 VITALS — BP 128/58 | HR 78 | Temp 97.4°F | Ht 60.0 in | Wt 109.7 lb

## 2021-08-08 DIAGNOSIS — R202 Paresthesia of skin: Secondary | ICD-10-CM | POA: Diagnosis not present

## 2021-08-08 DIAGNOSIS — R200 Anesthesia of skin: Secondary | ICD-10-CM | POA: Diagnosis not present

## 2021-08-08 DIAGNOSIS — D649 Anemia, unspecified: Secondary | ICD-10-CM | POA: Diagnosis not present

## 2021-08-08 DIAGNOSIS — R2681 Unsteadiness on feet: Secondary | ICD-10-CM | POA: Diagnosis not present

## 2021-08-08 DIAGNOSIS — R197 Diarrhea, unspecified: Secondary | ICD-10-CM

## 2021-08-08 DIAGNOSIS — K50919 Crohn's disease, unspecified, with unspecified complications: Secondary | ICD-10-CM

## 2021-08-08 DIAGNOSIS — K219 Gastro-esophageal reflux disease without esophagitis: Secondary | ICD-10-CM

## 2021-08-08 DIAGNOSIS — K449 Diaphragmatic hernia without obstruction or gangrene: Secondary | ICD-10-CM

## 2021-08-08 DIAGNOSIS — K5010 Crohn's disease of large intestine without complications: Secondary | ICD-10-CM

## 2021-08-08 DIAGNOSIS — R251 Tremor, unspecified: Secondary | ICD-10-CM

## 2021-08-08 LAB — CBC WITH DIFFERENTIAL/PLATELET
Basophils Absolute: 0.1 10*3/uL (ref 0.0–0.1)
Basophils Relative: 1.2 % (ref 0.0–3.0)
Eosinophils Absolute: 0.6 10*3/uL (ref 0.0–0.7)
Eosinophils Relative: 4.7 % (ref 0.0–5.0)
HCT: 26.2 % — ABNORMAL LOW (ref 36.0–46.0)
Hemoglobin: 8.7 g/dL — ABNORMAL LOW (ref 12.0–15.0)
Lymphocytes Relative: 8.8 % — ABNORMAL LOW (ref 12.0–46.0)
Lymphs Abs: 1 10*3/uL (ref 0.7–4.0)
MCHC: 33.1 g/dL (ref 30.0–36.0)
MCV: 90.5 fl (ref 78.0–100.0)
Monocytes Absolute: 0.5 10*3/uL (ref 0.1–1.0)
Monocytes Relative: 4.3 % (ref 3.0–12.0)
Neutro Abs: 9.7 10*3/uL — ABNORMAL HIGH (ref 1.4–7.7)
Neutrophils Relative %: 81 % — ABNORMAL HIGH (ref 43.0–77.0)
Platelets: 593 10*3/uL — ABNORMAL HIGH (ref 150.0–400.0)
RBC: 2.9 Mil/uL — ABNORMAL LOW (ref 3.87–5.11)
RDW: 19.6 % — ABNORMAL HIGH (ref 11.5–15.5)
WBC: 11.9 10*3/uL — ABNORMAL HIGH (ref 4.0–10.5)

## 2021-08-08 LAB — IBC + FERRITIN
Ferritin: 201.6 ng/mL (ref 10.0–291.0)
Iron: 42 ug/dL (ref 42–145)
Saturation Ratios: 18.1 % — ABNORMAL LOW (ref 20.0–50.0)
TIBC: 232.4 ug/dL — ABNORMAL LOW (ref 250.0–450.0)
Transferrin: 166 mg/dL — ABNORMAL LOW (ref 212.0–360.0)

## 2021-08-08 LAB — COMPREHENSIVE METABOLIC PANEL
ALT: 17 U/L (ref 0–35)
AST: 17 U/L (ref 0–37)
Albumin: 3.2 g/dL — ABNORMAL LOW (ref 3.5–5.2)
Alkaline Phosphatase: 75 U/L (ref 39–117)
BUN: 22 mg/dL (ref 6–23)
CO2: 22 mEq/L (ref 19–32)
Calcium: 7.8 mg/dL — ABNORMAL LOW (ref 8.4–10.5)
Chloride: 106 mEq/L (ref 96–112)
Creatinine, Ser: 1.07 mg/dL (ref 0.40–1.20)
GFR: 50.59 mL/min — ABNORMAL LOW (ref >60.00)
Glucose, Bld: 86 mg/dL (ref 70–99)
Potassium: 3.8 mEq/L (ref 3.5–5.1)
Sodium: 138 mEq/L (ref 135–145)
Total Bilirubin: 0.3 mg/dL (ref 0.2–1.2)
Total Protein: 7.2 g/dL (ref 6.0–8.3)

## 2021-08-08 LAB — FOLATE: Folate: >24.2 ng/mL (ref >5.9)

## 2021-08-08 LAB — VITAMIN B12: Vitamin B-12: 904 pg/mL (ref 211–911)

## 2021-08-08 MED ORDER — DIPHENOXYLATE-ATROPINE 2.5-0.025 MG PO TABS: 2.0000 | ORAL_TABLET | Freq: Three times a day (TID) | ORAL | 6 refills | Status: DC

## 2021-08-08 MED ORDER — COLESTIPOL HCL 1 G PO TABS: 2.0000 g | ORAL_TABLET | Freq: Two times a day (BID) | ORAL | 3 refills | Status: DC

## 2021-08-08 MED ORDER — DICYCLOMINE HCL 20 MG PO TABS: 20.0000 mg | ORAL_TABLET | Freq: Four times a day (QID) | ORAL | 3 refills | Status: DC | PRN

## 2021-08-08 MED ORDER — OMEPRAZOLE 40 MG PO CPDR: 40.0000 mg | DELAYED_RELEASE_CAPSULE | Freq: Two times a day (BID) | ORAL | 3 refills | Status: DC

## 2021-08-08 MED ORDER — MESALAMINE 1.2 G PO TBEC
4.8000 g | DELAYED_RELEASE_TABLET | Freq: Every day | ORAL | 3 refills | Status: DC
Start: 2021-08-08 — End: 2021-10-31
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
## 2021-08-08 NOTE — Patient Instructions (Addendum)
Your provider has requested that you go to the basement level for lab work before leaving today. Press "B" on the elevator. The lab is located at the first door on the left as you exit the elevator.  We have sent the following medications to your pharmacy for you to pick up at your convenience:  All your GI meds!  Take 2 zenpep with meals and snacks.  If this works for you, call us and we can send in a prescription.  Please follow up on ___________________

## 2021-08-09 ENCOUNTER — Other Ambulatory Visit: Payer: Self-pay | Admitting: Internal Medicine

## 2021-08-09 ENCOUNTER — Telehealth: Payer: Self-pay | Admitting: Internal Medicine

## 2021-08-09 DIAGNOSIS — R609 Edema, unspecified: Secondary | ICD-10-CM

## 2021-08-09 LAB — HEPATITIS B SURFACE ANTIGEN: Hepatitis B Surface Ag: NONREACTIVE

## 2021-08-09 LAB — HEPATITIS B SURFACE ANTIBODY,QUALITATIVE: Hep B S Ab: NONREACTIVE

## 2021-08-09 LAB — CERULOPLASMIN: Ceruloplasmin: 39 mg/dL (ref 18–53)

## 2021-08-09 MED ORDER — FUROSEMIDE 20 MG PO TABS: 20.0000 mg | ORAL_TABLET | Freq: Every day | ORAL | 1 refills | Status: DC | PRN

## 2021-08-09 NOTE — Telephone Encounter (Signed)
Pt call and stated she saw dr.Hernandez on yesterday and she was sending a RX for furosemide to the pharmacy and stated it was not call in.pt stated she need it now.

## 2021-08-10 ENCOUNTER — Telehealth: Payer: Self-pay | Admitting: Internal Medicine

## 2021-08-10 NOTE — Telephone Encounter (Signed)
Pt called, requesting a referral to see Neurosurgeon - Dr. Carles Collet. Please advise. [phone]

## 2021-08-10 NOTE — Telephone Encounter (Signed)
Neurology referral notes from referral placed on 08/08/21: General 08/08/2021  3:16 PM Craddock, Hinton Dyer T - -  Note:   Pt last seen Tat for tremors on 04-01-21 waiting on notes to be completed    LVM notifying pt that referral she requested has been sent & that office should be contacting her soon.   Of note: since she has already been seen by that office she can call them & set up an appt. Notes will be sent to that office.

## 2021-08-11 LAB — QUANTIFERON-TB GOLD PLUS

## 2021-08-13 LAB — QUANTIFERON-TB GOLD PLUS
QuantiFERON Mitogen Value: 6.13 IU/mL
QuantiFERON Nil Value: 0.05 IU/mL
QuantiFERON TB1 Ag Value: 0.07 IU/mL
QuantiFERON TB2 Ag Value: 0.06 IU/mL
QuantiFERON-TB Gold Plus: NEGATIVE

## 2021-08-13 LAB — SPECIMEN STATUS REPORT
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
## 2021-08-14 ENCOUNTER — Ambulatory Visit: Payer: Medicare Other | Admitting: Neurology

## 2021-08-15 ENCOUNTER — Encounter: Payer: Self-pay | Admitting: Neurology

## 2021-08-15 ENCOUNTER — Ambulatory Visit (INDEPENDENT_AMBULATORY_CARE_PROVIDER_SITE_OTHER): Payer: Medicare Other | Admitting: Neurology

## 2021-08-15 VITALS — BP 150/78 | HR 60 | Ht 60.0 in | Wt 106.0 lb

## 2021-08-15 DIAGNOSIS — M5416 Radiculopathy, lumbar region: Secondary | ICD-10-CM | POA: Diagnosis not present

## 2021-08-16 ENCOUNTER — Ambulatory Visit (INDEPENDENT_AMBULATORY_CARE_PROVIDER_SITE_OTHER): Payer: Medicare Other | Admitting: Neurology

## 2021-08-16 DIAGNOSIS — G5732 Lesion of lateral popliteal nerve, left lower limb: Secondary | ICD-10-CM

## 2021-08-16 DIAGNOSIS — M5416 Radiculopathy, lumbar region: Secondary | ICD-10-CM

## 2021-08-16 NOTE — Procedures (Signed)
[facility] Neurology  [address] [place], [place]  [place], [place] [address] Tel: [phone] Fax:  [phone] Test Date:  08/16/2021  Patient: [name] DOB: 1945/02/14 Physician: Narda Amber, DO  Sex: Female Height: 5' " Ref Phys: Wells Guiles Tat, D.O.  ID#: [id number]   Technician:    Patient Complaints: This is a 76 year old female referred for evaluation of left foot drop.  NCV & EMG Findings: Extensive electrodiagnostic testing of the left lower extremity and additional studies of the right shows:  Left superficial peroneal sensory response is absent.  Right superficial peroneal and bilateral sural sensory responses are within normal limits. Bilateral peroneal motor responses are absent at the extensor digitorum brevis and showed reduced amplitude at the tibialis anterior (L1.6, R1.9 mV), along with severely slowed conduction velocity across the fibular head on the left (Poplit-Fib Head, 14 m/s).  Bilateral tibial H reflex studies are within normal limits. Active on chronic motor axonal loss changes are seen affecting the left anterior tibialis, extensor hallucis longus, and fibularis longus muscles.  Mild chronic motor axonal loss changes are seen affecting the L5 myotome on the right.  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 There is no evidence of a large fiber sensorimotor polyneuropathy affecting the lower extremities.   ___________________________ Narda Amber, DO    Nerve Conduction Studies Anti Sensory Summary Table   Stim Site NR Peak (ms) Norm Peak (ms) P-T Amp (V) Norm P-T Amp  Left Sup Peroneal Anti Sensory (Ant Lat Mall)  32C  12 cm NR  <4.6  >3  Right Sup Peroneal Anti Sensory (Ant Lat Mall)  32C  12 cm    2.0 <4.6 5.5 >3  Left Sural Anti Sensory (Lat Mall)  32C  Calf    3.7 <4.6 9.7 >3  Right Sural Anti Sensory (Lat  Mall)  32C  Calf    3.1 <4.6 6.4 >3   Motor Summary Table   Stim Site NR Onset (ms) Norm Onset (ms) O-P Amp (mV) Norm O-P Amp Site1 Site2 Delta-0 (ms) Dist (cm) Vel (m/s) Norm Vel (m/s)  Left Peroneal Motor (Ext Dig Brev)  32C  Ankle NR  <6.0  >2.5 B Fib Ankle  0.0  >40  B Fib NR     Poplt B Fib  0.0  >40  Poplt NR            Right Peroneal Motor (Ext Dig Brev)  32C  Ankle NR  <6.0  >2.5 B Fib Ankle  0.0  >40  B Fib NR     Poplt B Fib  0.0  >40  Poplt NR            Left Peroneal TA Motor (Tib Ant)  32C  Fib Head    3.1 <4.5 1.6 >3 Poplit Fib Head 4.9 7.0 14 >40  Poplit    8.0  0.5         Right Peroneal TA Motor (Tib Ant)  32C  Fib Head    3.0 <4.5 1.9 >3 Poplit Fib Head 1.4 7.0 50 >40  Poplit    4.4  1.1         Left Tibial Motor (Abd Hall Brev)  32C  Ankle    3.2 <6.0 5.9 >4 Knee Ankle 8.5 37.0 44 >40  Knee    11.7  2.9         Site 3  11.4  3.1         Right Tibial Motor (Abd Hall Brev)  32C  Ankle    4.2 <6.0 7.5 >4 Knee Ankle 7.8 38.0 49 >40  Knee    12.0  7.3          H Reflex Studies   NR H-Lat (ms) Lat Norm (ms) L-R H-Lat (ms)  Left Tibial (Gastroc)  32C     31.16 <35 0.00  Right Tibial (Gastroc)  32C     31.16 <35 0.00   EMG   Side Muscle Ins Act Fibs Psw Fasc Number Recrt Dur Dur. Amp Amp. Poly Poly. Comment  Left AntTibialis Nml 1+ Nml Nml SMU Rapid All 1+ All 1+ All 1+ N/A  Left ExtHallLong Nml 1+ Nml Nml SMU Rapid All 1+ All 1+ All 1+ N/A  Left Fibularis Long Nml 1+ Nml Nml SMU Rapid All 1+ All 1+ All 1+ N/A  Left Flex Dig Long Nml Nml Nml Nml 1- Mod-V Nml Nml Nml Nml Nml Nml N/A  Left Gastroc Nml Nml Nml Nml Nml Nml Nml Nml Nml Nml Nml Nml N/A  Left GluteusMed Nml Nml Nml Nml Nml Nml Nml Nml Nml Nml Nml Nml N/A  Left RectFemoris Nml Nml Nml Nml Nml Nml Nml Nml Nml Nml Nml Nml N/A  Left BicepsFemS Nml Nml Nml Nml Nml Nml Nml Nml Nml Nml Nml Nml N/A  Right AntTibialis Nml Nml Nml Nml 1- Rapid Few 1+ Few 1+ Few  1+ N/A  Right Gastroc Nml Nml Nml  Nml Nml Nml Nml Nml Nml Nml Nml Nml N/A  Right Flex Dig Long Nml Nml Nml Nml 1- Rapid Few 1+ Few 1+ Few  1+ N/A  Right RectFemoris Nml Nml Nml Nml Nml Nml Nml Nml Nml Nml Nml Nml N/A  Right GluteusMed Nml Nml Nml Nml 1- Rapid Few 1+ Few 1+ Few  1+ N/A    Waveforms:

## 2021-08-17 ENCOUNTER — Telehealth: Payer: Self-pay | Admitting: Neurology

## 2021-08-17 NOTE — Telephone Encounter (Signed)
Let pt know that EMG shows peroneal nerve is pinched at fibular head (that is the smaller bone on outside of bottom of leg).  Schedule nerve ultrasound with Dr. Berdine Addison to see if there is anything further to do.

## 2021-08-20 ENCOUNTER — Other Ambulatory Visit: Payer: Self-pay

## 2021-08-20 DIAGNOSIS — G5732 Lesion of lateral popliteal nerve, left lower limb: Secondary | ICD-10-CM

## 2021-08-20 NOTE — Telephone Encounter (Signed)
Patient called back

## 2021-08-20 NOTE — Telephone Encounter (Signed)
Patient returned call to [facility].

## 2021-08-20 NOTE — Telephone Encounter (Signed)
Called patient back and left voicemail message

## 2021-08-20 NOTE — Telephone Encounter (Signed)
Called pateint and left message

## 2021-08-24 ENCOUNTER — Other Ambulatory Visit: Payer: Self-pay | Admitting: Internal Medicine

## 2021-08-24 ENCOUNTER — Ambulatory Visit
Admission: RE | Admit: 2021-08-24 | Discharge: 2021-08-24 | Disposition: A | Discharge status: Home / Self Care | Payer: Medicare Other | Source: Ambulatory Visit | Attending: Neurology | Admitting: Neurology

## 2021-08-24 DIAGNOSIS — B001 Herpesviral vesicular dermatitis: Secondary | ICD-10-CM

## 2021-08-24 DIAGNOSIS — M5416 Radiculopathy, lumbar region: Secondary | ICD-10-CM

## 2021-08-26 NOTE — Telephone Encounter (Signed)
Please call pt again to give results and schedule nerve ultrasound.  She appears to be active on mychart as well.

## 2021-08-27 NOTE — Telephone Encounter (Signed)
Pt called no answer left a voice mail to call the office back also sent a my chart message with results

## 2021-08-31 ENCOUNTER — Ambulatory Visit: Payer: Medicare Other | Admitting: Internal Medicine

## 2021-09-05 ENCOUNTER — Other Ambulatory Visit: Payer: Self-pay | Admitting: Internal Medicine

## 2021-09-05 DIAGNOSIS — R609 Edema, unspecified: Secondary | ICD-10-CM

## 2021-09-14 NOTE — Progress Notes (Signed)
My chart message sent to patient to call the office

## 2021-09-19 ENCOUNTER — Encounter: Payer: Self-pay | Admitting: Internal Medicine

## 2021-09-19 ENCOUNTER — Ambulatory Visit (INDEPENDENT_AMBULATORY_CARE_PROVIDER_SITE_OTHER): Payer: Medicare Other | Admitting: Internal Medicine

## 2021-09-19 VITALS — BP 130/84 | HR 84 | Temp 97.8°F | Ht 59.5 in | Wt 103.4 lb

## 2021-09-19 DIAGNOSIS — F339 Major depressive disorder, recurrent, unspecified: Secondary | ICD-10-CM

## 2021-09-19 DIAGNOSIS — B001 Herpesviral vesicular dermatitis: Secondary | ICD-10-CM

## 2021-09-19 DIAGNOSIS — I10 Essential (primary) hypertension: Secondary | ICD-10-CM

## 2021-09-19 DIAGNOSIS — Z0000 Encounter for general adult medical examination without abnormal findings: Secondary | ICD-10-CM | POA: Diagnosis not present

## 2021-09-19 DIAGNOSIS — R609 Edema, unspecified: Secondary | ICD-10-CM

## 2021-09-19 DIAGNOSIS — J302 Other seasonal allergic rhinitis: Secondary | ICD-10-CM | POA: Diagnosis not present

## 2021-09-19 DIAGNOSIS — Z13820 Encounter for screening for osteoporosis: Secondary | ICD-10-CM

## 2021-09-19 DIAGNOSIS — K219 Gastro-esophageal reflux disease without esophagitis: Secondary | ICD-10-CM

## 2021-09-19 DIAGNOSIS — Z780 Asymptomatic menopausal state: Secondary | ICD-10-CM

## 2021-09-19 LAB — COMPREHENSIVE METABOLIC PANEL
ALT: 9 U/L (ref 0–35)
AST: 15 U/L (ref 0–37)
Albumin: 3.8 g/dL (ref 3.5–5.2)
Alkaline Phosphatase: 67 U/L (ref 39–117)
BUN: 31 mg/dL — ABNORMAL HIGH (ref 6–23)
CO2: 25 mEq/L (ref 19–32)
Calcium: 9.4 mg/dL (ref 8.4–10.5)
Chloride: 103 mEq/L (ref 96–112)
Creatinine, Ser: 1.05 mg/dL (ref 0.40–1.20)
GFR: 51.71 mL/min — ABNORMAL LOW (ref >60.00)
Glucose, Bld: 88 mg/dL (ref 70–99)
Potassium: 4.2 mEq/L (ref 3.5–5.1)
Sodium: 137 mEq/L (ref 135–145)
Total Bilirubin: 0.3 mg/dL (ref 0.2–1.2)
Total Protein: 7.8 g/dL (ref 6.0–8.3)

## 2021-09-19 LAB — CBC WITH DIFFERENTIAL/PLATELET
Basophils Absolute: 0.1 10*3/uL (ref 0.0–0.1)
Basophils Relative: 0.8 % (ref 0.0–3.0)
Eosinophils Absolute: 0.5 10*3/uL (ref 0.0–0.7)
Eosinophils Relative: 4.9 % (ref 0.0–5.0)
HCT: 33.8 % — ABNORMAL LOW (ref 36.0–46.0)
Hemoglobin: 11.2 g/dL — ABNORMAL LOW (ref 12.0–15.0)
Lymphocytes Relative: 12 % (ref 12.0–46.0)
Lymphs Abs: 1.2 10*3/uL (ref 0.7–4.0)
MCHC: 33.1 g/dL (ref 30.0–36.0)
MCV: 95.8 fl (ref 78.0–100.0)
Monocytes Absolute: 0.6 10*3/uL (ref 0.1–1.0)
Monocytes Relative: 6.2 % (ref 3.0–12.0)
Neutro Abs: 7.8 10*3/uL — ABNORMAL HIGH (ref 1.4–7.7)
Neutrophils Relative %: 76.1 % (ref 43.0–77.0)
Platelets: 444 10*3/uL — ABNORMAL HIGH (ref 150.0–400.0)
RBC: 3.53 Mil/uL — ABNORMAL LOW (ref 3.87–5.11)
RDW: 16.1 % — ABNORMAL HIGH (ref 11.5–15.5)
WBC: 10.3 10*3/uL (ref 4.0–10.5)

## 2021-09-19 LAB — LIPID PANEL
Cholesterol: 157 mg/dL (ref 0–200)
HDL: 52.3 mg/dL (ref >39.00)
LDL Cholesterol: 86 mg/dL (ref 0–99)
NonHDL: 104.73
Total CHOL/HDL Ratio: 3
Triglycerides: 96 mg/dL (ref 0.0–149.0)
VLDL: 19.2 mg/dL (ref 0.0–40.0)

## 2021-09-19 MED ORDER — LOSARTAN POTASSIUM 100 MG PO TABS: 100.0000 mg | ORAL_TABLET | Freq: Every day | ORAL | 1 refills | Status: DC

## 2021-09-19 MED ORDER — CETIRIZINE HCL 10 MG PO TABS: 10.0000 mg | ORAL_TABLET | Freq: Every day | ORAL | 1 refills | Status: DC

## 2021-09-19 MED ORDER — OMEPRAZOLE 40 MG PO CPDR: 40.0000 mg | DELAYED_RELEASE_CAPSULE | Freq: Two times a day (BID) | ORAL | 1 refills | Status: DC

## 2021-09-19 MED ORDER — FUROSEMIDE 20 MG PO TABS: 20.0000 mg | ORAL_TABLET | Freq: Every day | ORAL | 2 refills | Status: DC | PRN

## 2021-09-19 MED ORDER — FLUTICASONE PROPIONATE 50 MCG/ACT NA SUSP: 2.0000 | Freq: Every day | NASAL | 1 refills | Status: DC

## 2021-09-19 MED ORDER — VALACYCLOVIR HCL 1 G PO TABS: ORAL_TABLET | ORAL | 1 refills | Status: DC

## 2021-09-19 MED ORDER — DULOXETINE HCL 60 MG PO CPEP: 60.0000 mg | ORAL_CAPSULE | Freq: Two times a day (BID) | ORAL | 1 refills | Status: DC

## 2021-09-19 NOTE — Addendum Note (Signed)
Addended by: Westley Hummer B on: 09/19/2021 11:46 AM   Modules accepted: Orders
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
## 2021-09-20 ENCOUNTER — Encounter (HOSPITAL_BASED_OUTPATIENT_CLINIC_OR_DEPARTMENT_OTHER): Payer: Medicare Other | Attending: General Surgery | Admitting: General Surgery

## 2021-09-20 DIAGNOSIS — L97812 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d: Secondary | ICD-10-CM | POA: Diagnosis present

## 2021-09-20 DIAGNOSIS — I10 Essential (primary) hypertension: Secondary | ICD-10-CM | POA: Diagnosis not present

## 2021-09-20 DIAGNOSIS — M170 Bilateral primary osteoarthritis of knee: Secondary | ICD-10-CM | POA: Diagnosis not present

## 2021-09-20 DIAGNOSIS — E43 Unspecified severe protein-calorie malnutrition: Secondary | ICD-10-CM | POA: Diagnosis not present

## 2021-09-20 DIAGNOSIS — C44722 Squamous cell carcinoma of skin of right lower limb, including hip: Secondary | ICD-10-CM | POA: Insufficient documentation

## 2021-09-20 NOTE — Progress Notes (Signed)
[name], [name] ([id number]) Visit Report for 09/20/2021 Abuse Risk Screen Details Patient Name: Date of Service: [name], [name]. 09/20/2021 9:00 [place] Record Number: [id number] Patient Account Number: [id number] Date of Birth/Sex: Treating RN: Jan 27, 1945 (76 y.o. [name] Primary Care [name]: [name] Other Clinician: Referring Keyshia Orwick: Treating Lachlan Pelto/Extender: Gevena Cotton Weeks in Treatment: 0 Abuse Risk Screen Items Answer ABUSE RISK SCREEN: Has anyone close to you tried to hurt or harm you recentlyo No Do you feel uncomfortable with anyone in your familyo No Has anyone forced you do things that you didnt want to doo No Electronic Signature(s) Signed: 09/20/2021 5:22:18 PM By: Baruch Gouty RN, BSN Entered By: Baruch Gouty on 09/20/2021 09:45:32 -------------------------------------------------------------------------------- Activities of Daily Living Details Patient Name: Date of Service: [name], [name]. 09/20/2021 9:00 [place] Record Number: [id number] Patient Account Number: [id number] Date of Birth/Sex: Treating RN: 12/22/1945 (76 y.o. [name] Primary Care Ladarrell Cornwall: [name] Other Clinician: Referring Delainy Mcelhiney: Treating Lilo Wallington/Extender: Gevena Cotton Weeks in Treatment: 0 Activities of Daily Living Items Answer Activities of Daily Living (Please select one for each item) Drive Automobile Completely Able T Medications ake Completely Able Use T elephone Completely Able Care for Appearance Completely Able Use T oilet Completely Able Bath / Shower Completely Able Dress Self Completely Able Feed Self Completely Able Walk Completely Able Get In / Out Bed Completely Able Housework Completely Able Prepare Meals Completely Able Handle Money Completely Able Shop for Self Completely Able Electronic Signature(s) Signed: 09/20/2021 5:22:18 PM By: Baruch Gouty  RN, BSN Entered By: Baruch Gouty on 09/20/2021 09:45:56 -------------------------------------------------------------------------------- Education Screening Details Patient Name: Date of Service: April [place] I. 09/20/2021 9:00 [place] Record Number: [id number] Patient Account Number: [id number] Date of Birth/Sex: Treating RN: [birth year] (76 y.o. [name] Primary Care Kursten Kruk: [name] Other Clinician: Referring Bertha Earwood: Treating Breezie Micucci/Extender: Caprice Kluver in Treatment: 0 Primary Learner Assessed: Patient Learning Preferences/Education Level/Primary Language Learning Preference: Explanation, Demonstration, Printed Material Highest Education Level: College or Above Preferred Language: English Cognitive Barrier Language Barrier: No Translator Needed: No Memory Deficit: No Emotional Barrier: No Cultural/Religious Beliefs Affecting Medical Care: No Physical Barrier Impaired Vision: Yes Glasses Impaired Hearing: No Decreased Hand dexterity: No Knowledge/Comprehension Knowledge Level: High Comprehension Level: High Ability to understand written instructions: High Ability to understand verbal instructions: High Motivation Anxiety Level: Calm Cooperation: Cooperative Education Importance: Acknowledges Need Interest in Health Problems: Asks Questions Perception: Coherent Willingness to Engage in Self-Management High Activities: Readiness to Engage in Self-Management High Activities: Electronic Signature(s) Signed: 09/20/2021 5:22:18 PM By: Baruch Gouty RN, BSN Entered By: Baruch Gouty on 09/20/2021 09:46:39 -------------------------------------------------------------------------------- Fall Risk Assessment Details Patient Name: Date of Service: April Austin I. 09/20/2021 9:00 [place] Record Number: [id number] Patient Account Number: [id number] Date of Birth/Sex: Treating RN: Jul 17, 1945 (76 y.o.  [name] Primary Care Yanil Dawe: [name] Other Clinician: Referring Doral Ventrella: Treating Brinklee Cisse/Extender: Gevena Cotton Weeks in Treatment: 0 Fall Risk Assessment Items Have you had 2 or more falls in the last 12 monthso 0 Yes Have you had any fall that resulted in injury in the last 12 monthso 0 No FALLS RISK SCREEN History of falling - immediate or within 3 months 25 Yes Secondary diagnosis (Do you have 2 or more medical diagnoseso) 0 No Ambulatory aid None/bed rest/wheelchair/nurse 0 Yes Crutches/cane/walker 0 No Furniture 0  No Intravenous therapy Access/Saline/Heparin Lock 0 No Gait/Transferring Normal/ bed rest/ wheelchair 0 Yes Weak (short steps with or without shuffle, stooped but able to lift head while walking, may seek 0 No support from furniture) Impaired (short steps with shuffle, may have difficulty arising from chair, head down, impaired 0 No balance) Mental Status Oriented to own ability 0 Yes Electronic Signature(s) Signed: 09/20/2021 5:22:18 PM By: Baruch Gouty RN, BSN Entered By: Baruch Gouty on 09/20/2021 09:47:05 -------------------------------------------------------------------------------- Foot Assessment Details Patient Name: Date of Service: April Austin I. 09/20/2021 9:00 [place] Record Number: [id number] Patient Account Number: [id number] Date of Birth/Sex: Treating RN: June 15, 1945 (76 y.o. [name] Primary Care Vianca Bracher: [name] Other Clinician: Referring Aaminah Forrester: Treating Isel Skufca/Extender: Gevena Cotton Weeks in Treatment: 0 Foot Assessment Items Site Locations + = Sensation present, - = Sensation absent, C = Callus, U = Ulcer R = Redness, W = Warmth, M = Maceration, PU = Pre-ulcerative lesion F = Fissure, S = Swelling, D = Dryness Assessment Right: Left: Other Deformity: No No Prior Foot Ulcer: No No Prior Amputation: No No Charcot Joint:  No No Ambulatory Status: Ambulatory Without Help Gait: Steady Electronic Signature(s) Signed: 09/20/2021 5:22:18 PM By: Baruch Gouty RN, BSN Entered By: Baruch Gouty on 09/20/2021 09:47:49 -------------------------------------------------------------------------------- Nutrition Risk Screening Details Patient Name: Date of Service: April Austin I. 09/20/2021 9:00 [facility] Record Number: [id number] Patient Account Number: [id number] Date of Birth/Sex: Treating RN: 08-Apr-1945 (76 y.o. [name], [name] Primary Care Christophe Rising: [name] Other Clinician: Referring Starling Christofferson: Treating Yeiden Frenkel/Extender: Gevena Cotton Weeks in Treatment: 0 Height (in): 60 Weight (lbs): 101 Body Mass Index (BMI): 19.7 Nutrition Risk Screening Items Score Screening NUTRITION RISK SCREEN: I have an illness or condition that made me change the kind and/or amount of food I eat 0 No I eat fewer than two meals per day 0 No I eat few fruits and vegetables, or milk products 0 No I have three or more drinks of beer, liquor or wine almost every day 0 No I have tooth or mouth problems that make it hard for me to eat 0 No I don't always have enough money to buy the food I need 0 No I eat alone most of the time 0 No I take three or more different prescribed or over-the-counter drugs a day 1 Yes Without wanting to, I have lost or gained 10 pounds in the last six months 0 No I am not always physically able to shop, cook and/or feed myself 0 No Nutrition Protocols Good Risk Protocol 0 No interventions needed Moderate Risk Protocol High Risk Proctocol Risk Level: Good Risk Score: 1 Electronic Signature(s) Signed: 09/20/2021 5:22:18 PM By: Baruch Gouty RN, BSN Entered By: Baruch Gouty on 09/20/2021 09:47:27

## 2021-09-20 NOTE — Progress Notes (Signed)
April, [name] ([id number]) Visit Report for 09/20/2021 Allergy List Details Patient Name: Date of Service: [name], [name] I. 09/20/2021 9:00 [place] Record Number: [id number] Patient Account Number: [id number] Date of Birth/Sex: Treating RN: 08-25-1945 (76 y.o. [name] Primary Care Angelic Schnelle: Domingo Mend Other Clinician: Referring Joanne Brander: Treating Zarius Furr/Extender: [place] , Olam Idler Weeks in Treatment: 0 Allergies Active Allergies adhesive tape Reaction: Redness and skin peeling Severity: Mild Active: 11/16/2019 aspirin Reaction: Intestinal Bleeding Severity: Moderate Active: 11/16/1995 ciprofloxacin Reaction: Nausea/Vomiting Severity: Moderate Active: 11/16/1995 codeine Reaction: N/V Severity: Moderate Active: 11/16/1995 Demerol Reaction: N/V Severity: Severe Active: 11/16/1995 Septra Reaction: Nausea Severity: Moderate Active: 11/16/1995 latex Reaction: Redness and skin peeling Severity: Severe Active: 11/16/1995 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 09/20/2021 9:00 A M Medical Record Number: [id number] Patient Account Number: [id number] Date of Birth/Sex: Treating RN: Jun 01, 1945 (76 y.o. [name] Primary Care Nalaya Wojdyla: Domingo Mend Other Clinician: Referring Jourdon Zimmerle: Treating Jazline Cumbee/Extender: Caprice Kluver in Treatment: 0 Visit Information Patient Arrived: Ambulatory Arrival Time: 09:32 Accompanied By: self Transfer Assistance: None Patient Identification Verified: Yes Secondary Verification Process  Completed: Yes Patient Requires Transmission-Based Precautions: No Patient Has Alerts: No History Since Last Visit Has Dressing in Place as Prescribed: Yes Electronic Signature(s) Signed: 09/20/2021 5:22:18 PM By: Baruch Gouty RN, BSN Entered By: Baruch Gouty on 09/20/2021 09:32:59 -------------------------------------------------------------------------------- Clinic Level of Care Assessment Details Patient Name: Date of Service: [name] I. 09/20/2021 9:00 [place] Record Number: [id number] Patient Account Number: [id number] Date of Birth/Sex: Treating RN: 04/25/1945 (76 y.o. [name] Primary Care Bellamarie Pflug: Domingo Mend Other Clinician: Referring Natsha Guidry: Treating Alondra Sahni/Extender: Gevena Cotton Weeks in Treatment: 0 Clinic Level of Care Assessment Items TOOL 1 Quantity Score []  - 0 Use when EandM and Procedure is performed on INITIAL visit ASSESSMENTS - Nursing Assessment / Reassessment X- 1 20 General Physical Exam (combine w/ comprehensive assessment (listed just below) when performed on new pt. evals) X- 1 25 Comprehensive Assessment (HX, ROS, Risk Assessments, Wounds Hx, etc.) ASSESSMENTS - Wound and Skin Assessment / Reassessment []  - 0 Dermatologic / Skin Assessment (not related to wound area) ASSESSMENTS - Ostomy and/or Continence Assessment and Care []  - 0 Incontinence Assessment and Management []  - 0 Ostomy Care Assessment and Management (repouching, etc.) PROCESS - Coordination of Care X - Simple Patient / Family Education for ongoing care 1 15 []  - 0 Complex (extensive) Patient / Family Education for ongoing care X- 1 10 Staff obtains Programmer, systems, Records, T Results / Process Orders est []  - 0 Staff telephones HHA, Nursing Homes / Clarify orders / etc []  - 0 Routine Transfer to another Facility (non-emergent condition) []  - 0 Routine Hospital Admission (non-emergent condition) X- 1 15 New Admissions /  Biomedical engineer / Ordering NPWT Apligraf, etc. , []  - 0 Emergency Hospital Admission (emergent condition) PROCESS - Special Needs []  - 0 Pediatric / Minor Patient Management []  - 0 Isolation Patient Management []  - 0 Hearing / Language / Visual special needs []  - 0 Assessment of Community assistance (transportation, D/C planning, etc.) []  - 0 Additional assistance / Altered mentation []  - 0 Support Surface(s) Assessment (bed, cushion, seat, etc.) INTERVENTIONS - Miscellaneous []  -  0 External ear exam []  - 0 Patient Transfer (multiple staff / Civil Service fast streamer / Similar devices) []  - 0 Simple Staple / Suture removal (25 or less) []  - 0 Complex Staple / Suture removal (26 or more) []  - 0 Hypo/Hyperglycemic Management (do not check if billed separately) X- 1 15 Ankle / Brachial Index (ABI) - do not check if billed separately Has the patient been seen at the hospital within the last three years: Yes Total Score: 100 Level Of Care: New/Established - Level 3 Electronic Signature(s) Signed: 09/20/2021 5:22:18 PM By: Baruch Gouty RN, BSN Entered By: Baruch Gouty on 09/20/2021 10:19:59 -------------------------------------------------------------------------------- Compression Therapy Details Patient Name: Date of Service: [name] I. 09/20/2021 9:00 [place] Record Number: [id number] Patient Account Number: [id number] Date of Birth/Sex: Treating RN: October 19, 1945 (76 y.o. [name] Primary Care Tangee Marszalek: Domingo Mend Other Clinician: Referring Danaija Eskridge: Treating Zamani Crocker/Extender: Gevena Cotton Weeks in Treatment: 0 Compression Therapy Performed for Wound Assessment: Wound #2 Right,Lateral Lower Leg Performed By: Clinician Baruch Gouty, RN Compression Type: Three Layer Post Procedure Diagnosis Same as Pre-procedure Electronic Signature(s) Signed: 09/20/2021 5:22:18 PM By: Baruch Gouty RN, BSN Entered By: Baruch Gouty on 09/20/2021 10:20:18 -------------------------------------------------------------------------------- Encounter Discharge Information Details Patient Name: Date of Service: [name] I. 09/20/2021 9:00 [place] Record Number: [id number] Patient Account Number: [id number] Date of Birth/Sex: Treating RN: 10-Oct-1945 (76 y.o. [name] Primary Care Blade Scheff: Domingo Mend Other Clinician: Referring Jessiah Wojnar: Treating Marien Manship/Extender: Gevena Cotton Weeks in Treatment: 0 Encounter Discharge Information Items Post Procedure Vitals Discharge Condition: Stable Temperature (F): 97.9 Ambulatory Status: Ambulatory Pulse (bpm): 96 Discharge Destination: Home Respiratory Rate (breaths/min): 18 Transportation: Private Auto Blood Pressure (mmHg): 179/88 Accompanied By: self Schedule Follow-up Appointment: Yes Clinical Summary of Care: Patient Declined Electronic Signature(s) Signed: 09/20/2021 5:22:18 PM By: Baruch Gouty RN, BSN Entered By: Baruch Gouty on 09/20/2021 16:24:11 -------------------------------------------------------------------------------- Lower Extremity Assessment Details Patient Name: Date of Service: [name] I. 09/20/2021 9:00 A M Medical Record Number: [id number] Patient Account Number: [id number] Date of Birth/Sex: Treating RN: 08/07/1945 (76 y.o. [name] Primary Care Gianella Chismar: Domingo Mend Other Clinician: Referring Jefferey Lippmann: Treating Zanyla Klebba/Extender: Gevena Cotton Weeks in Treatment: 0 Edema Assessment Assessed: [Left: No] [Right: No] Edema: [Left: Ye] [Right: s] Calf Left: Right: Point of Measurement: From Medial Instep 30.3 cm Ankle Left: Right: Point of Measurement: From Medial Instep 18.5 cm Vascular Assessment Pulses: Dorsalis Pedis Palpable: [Right:Yes] Blood Pressure: Brachial: [Right:179] Ankle: [Right:Dorsalis Pedis: 172  0.96] Notes right PT noncompressible Electronic Signature(s) Signed: 09/20/2021 5:22:18 PM By: Baruch Gouty RN, BSN Entered By: Baruch Gouty on 09/20/2021 10:07:54 -------------------------------------------------------------------------------- Multi Wound Chart Details Patient Name: Date of Service: [name] I. 09/20/2021 9:00 A M Medical Record Number: [id number] Patient Account Number: [id number] Date of Birth/Sex: Treating RN: 30-May-1945 (76 y.o. F) Primary Care Elanah Osmanovic: Domingo Mend Other Clinician: Referring Avion Patella: Treating Snyder Colavito/Extender: Gevena Cotton Weeks in Treatment: 0 Vital Signs Height(in): 60 Pulse(bpm): 82 Weight(lbs): 101 Blood Pressure(mmHg): 179/88 Body Mass Index(BMI): 19.7 Temperature(F): 97.9 Respiratory Rate(breaths/min): 18 Photos: [N/A:N/A] Right, Lateral Lower Leg N/A N/A Wound Location: Surgical Injury N/A N/A Wounding Event: Open Surgical Wound N/A N/A Primary Etiology: Venous Leg Ulcer N/A N/A Secondary Etiology: Cataracts, Chronic sinus N/A N/A Comorbid History: problems/congestion, Hypertension, Crohns, Osteoarthritis 08/17/2021 N/A N/A Date Acquired: 0 N/A N/A Weeks of Treatment: Open N/A N/A Wound Status:  No N/A N/A Wound Recurrence: Yes N/A N/A Clustered Wound: 7x3.3x0.1 N/A N/A Measurements L x W x D (cm) 18.143 N/A N/A A (cm) : rea 1.814 N/A N/A Volume (cm) : 0.00% N/A N/A % Reduction in Area: 0.00% N/A N/A % Reduction in Volume: Full Thickness Without Exposed N/A N/A Classification: Support Structures Medium N/A N/A Exudate Amount: Purulent N/A N/A Exudate Type: yellow, brown, green N/A N/A Exudate Color: Yes N/A N/A Foul Odor A Cleansing: fter No N/A N/A Odor Anticipated Due to Product Use: Distinct, outline attached N/A N/A Wound Margin: Small (1-33%) N/A N/A Granulation A mount: Red, Friable N/A N/A Granulation Quality: Large (67-100%) N/A  N/A Necrotic Amount: Fat Layer (Subcutaneous Tissue): Yes N/A N/A Exposed Structures: Fascia: No Tendon: No Muscle: No Joint: No Bone: No None N/A N/A Epithelialization: Treatment Notes Electronic Signature(s) Signed: 09/20/2021 10:06:29 AM By: [name] MD FACS Entered By: [name] on 09/20/2021 10:06:29 -------------------------------------------------------------------------------- Multi-Disciplinary Care Plan Details Patient Name: Date of Service: [name] I. 09/20/2021 9:00 A M Medical Record Number: [id number] Patient Account Number: [id number] Date of Birth/Sex: Treating RN: 01-Feb-1945 (76 y.o. [name] Primary Care Jaimarie Rapozo: Domingo Mend Other Clinician: Referring Jillyn Stacey: Treating Soraiya Ahner/Extender: Caprice Kluver in Treatment: 0 Multidisciplinary Care Plan reviewed with physician Active Inactive Abuse / Safety / Falls / Self Care Management Nursing Diagnoses: History of Falls Goals: Patient/caregiver will verbalize/demonstrate measures taken to prevent injury and/or falls Date Initiated: 09/20/2021 Target Resolution Date: 10/18/2021 Goal Status: Active Interventions: Assess fall risk on admission and as needed Assess impairment of mobility on admission and as needed per policy Notes: Wound/Skin Impairment Nursing Diagnoses: Impaired tissue integrity Knowledge deficit related to ulceration/compromised skin integrity Goals: Patient/caregiver will verbalize understanding of skin care regimen Date Initiated: 09/20/2021 Target Resolution Date: 10/18/2021 Goal Status: Active Ulcer/skin breakdown will have a volume reduction of 30% by week 4 Date Initiated: 09/20/2021 Target Resolution Date: 10/18/2021 Goal Status: Active Interventions: Assess patient/caregiver ability to obtain necessary supplies Assess patient/caregiver ability to perform ulcer/skin care regimen upon admission and as  needed Assess ulceration(s) every visit Provide education on ulcer and skin care Treatment Activities: Skin care regimen initiated : 09/20/2021 Topical wound management initiated : 09/20/2021 Notes: Electronic Signature(s) Signed: 09/20/2021 5:22:18 PM By: Baruch Gouty RN, BSN Entered By: Baruch Gouty on 09/20/2021 10:18:43 -------------------------------------------------------------------------------- Pain Assessment Details Patient Name: Date of Service: [name] I. 09/20/2021 9:00 [place] Record Number: [id number] Patient Account Number: [id number] Date of Birth/Sex: Treating RN: [birth year] (76 y.o. [name] Primary Care Daniel Johndrow: Domingo Mend Other Clinician: Referring Leyna Vanderkolk: Treating Verle Wheeling/Extender: Gevena Cotton Weeks in Treatment: 0 Active Problems Location of Pain Severity and Description of Pain Patient Has Paino Yes Site Locations Pain Location: Pain in Ulcers With Dressing Change: Yes Duration of the Pain. Constant / Intermittento Intermittent Rate the pain. Current Pain Level: 0 Worst Pain Level: 3 Least Pain Level: 0 Character of Pain Describe the Pain: Aching, Tender Pain Management and Medication Current Pain Management: Is the Current Pain Management Adequate: Adequate How does your wound impact your activities of daily livingo Sleep: No Bathing: No Appetite: No Relationship With Others: No Bladder Continence: No Emotions: No Bowel Continence: No Work: No Toileting: No Drive: No Dressing: No Hobbies: No Electronic Signature(s) Signed: 09/20/2021 5:22:18 PM By: Baruch Gouty RN, BSN Entered By: Baruch Gouty on 09/20/2021 10:08:52 -------------------------------------------------------------------------------- Patient/Caregiver Education Details Patient Name: Date of Service: Aida Raider,  Corianne I. 9/14/2023andnbsp9:00 A M Medical Record Number: [id number] Patient Account Number:  [id number] Date of Birth/Gender: Treating RN: [birth year] (76 y.o. [name] Primary Care Physician: Domingo Mend Other Clinician: Referring Physician: Treating Physician/Extender: Caprice Kluver in Treatment: 0 Education Assessment Education Provided To: Patient Education Topics Provided Wound/Skin Impairment: Handouts: Caring for Your Ulcer, Skin Care Do's and Dont's Methods: Explain/Verbal, Printed Responses: Reinforcements needed, State content correctly Electronic Signature(s) Signed: 09/20/2021 5:22:18 PM By: Baruch Gouty RN, BSN Entered By: Baruch Gouty on 09/20/2021 10:19:27 -------------------------------------------------------------------------------- Wound Assessment Details Patient Name: Date of Service: [name] I. 09/20/2021 9:00 A M Medical Record Number: [id number] Patient Account Number: [id number] Date of Birth/Sex: Treating RN: August 21, 1945 (75 y.o. [name], [name] Primary Care Karalina Tift: Domingo Mend Other Clinician: Referring Carolle Ishii: Treating Anhthu Perdew/Extender: Gevena Cotton Weeks in Treatment: 0 Wound Status Wound Number: 2 Primary Open Surgical Wound Etiology: Wound Location: Right, Lateral Lower Leg Secondary Venous Leg Ulcer Wounding Event: Surgical Injury Etiology: Date Acquired: 08/17/2021 Wound Status: Open Weeks Of Treatment: 0 Comorbid Cataracts, Chronic sinus problems/congestion, Hypertension, Clustered Wound: Yes History: Crohns, Osteoarthritis Photos Wound Measurements Length: (cm) 7 Width: (cm) 3.3 Depth: (cm) 0.1 Area: (cm) 18.143 Volume: (cm) 1.814 % Reduction in Area: 0% % Reduction in Volume: 0% Epithelialization: None Tunneling: No Undermining: No Wound Description Classification: Full Thickness Without Exposed Support Structures Wound Margin: Distinct, outline attached Exudate Amount: Medium Exudate Type: Purulent Exudate Color:  yellow, brown, green Foul Odor After Cleansing: Yes Due to Product Use: No Slough/Fibrino Yes Wound Bed Granulation Amount: Small (1-33%) Exposed Structure Granulation Quality: Red, Friable Fascia Exposed: No Necrotic Amount: Large (67-100%) Fat Layer (Subcutaneous Tissue) Exposed: Yes Necrotic Quality: Adherent Slough Tendon Exposed: No Muscle Exposed: No Joint Exposed: No Bone Exposed: No Treatment Notes Wound #2 (Lower Leg) Wound Laterality: Right, Lateral Cleanser Peri-Wound Care Sween Lotion (Moisturizing lotion) Discharge Instruction: Apply moisturizing lotion as directed Topical Primary Dressing IODOFLEX 0.9% Cadexomer Iodine Pad 4x6 cm Discharge Instruction: Apply to wound bed as instructed Secondary Dressing Zetuvit Plus 4x8 in Discharge Instruction: Apply over primary dressing as directed. Secured With Compression Wrap ThreePress (3 layer compression wrap) Discharge Instruction: Apply three layer compression as directed. Compression Stockings Add-Ons Electronic Signature(s) Signed: 09/20/2021 5:22:18 PM By: Baruch Gouty RN, BSN Entered By: Baruch Gouty on 09/20/2021 09:56:59 -------------------------------------------------------------------------------- [facility] Details Patient Name: Date of Service: [name] I. 09/20/2021 9:00 A M Medical Record Number: [id number] Patient Account Number: [id number] Date of Birth/Sex: Treating RN: 1945/09/09 (76 y.o. [name] Primary Care Katielynn Horan: Domingo Mend Other Clinician: Referring Ermie Glendenning: Treating Aleicia Kenagy/Extender: Gevena Cotton Weeks in Treatment: 0 Vital Signs Time Taken: 09:33 Temperature (F): 97.9 Height (in): 60 Pulse (bpm): 96 Source: Stated Respiratory Rate (breaths/min): 18 Weight (lbs): 101 Blood Pressure (mmHg): 179/88 Source: Stated Reference Range: 80 - 120 mg / dl Body Mass Index (BMI): 19.7 Electronic Signature(s) Signed: 09/20/2021  5:22:18 PM By: Baruch Gouty RN, BSN Entered By: Baruch Gouty on 09/20/2021 09:35:52

## 2021-09-20 NOTE — Progress Notes (Signed)
[name], [name] ([id number]) Visit Report for 09/20/2021 Chief Complaint Document Details Patient Name: Date of Service: [name], [name] I. 09/20/2021 9:00 [place] Record Number: [id number] Patient Account Number: [id number] Date of Birth/Sex: Treating RN: February 18, 1945 (76 y.o. F) Primary Care Provider: Domingo Mend Other Clinician: Referring Provider: Treating Provider/Extender: Gevena Cotton Weeks in Treatment: 0 Information Obtained from: Patient Chief Complaint 11/15/2020; patient is here for review of a wound area on the right posterior calf 09/20/2021: Patient is here for a nonhealing wound after undergoing Mohs surgery on her right lower leg. Electronic Signature(s) Signed: 09/20/2021 10:06:58 AM By: [name] MD FACS Entered By: [name] on 09/20/2021 10:06:58 -------------------------------------------------------------------------------- Debridement Details Patient Name: Date of Service: April Austin I. 09/20/2021 9:00 [place] Record Number: [id number] Patient Account Number: [id number] Date of Birth/Sex: Treating RN: July 31, 1945 (76 y.o. [name], [name] Primary Care Provider: Domingo Mend Other Clinician: Referring Provider: Treating Provider/Extender: Gevena Cotton Weeks in Treatment: 0 Debridement Performed for Assessment: Wound #2 Right,Lateral Lower Leg Performed By: Physician [name], MD Debridement Type: Debridement Severity of Tissue Pre Debridement: Fat layer exposed Level of Consciousness (Pre-procedure): Awake and Alert Pre-procedure Verification/Time Out Yes - 10:10 Taken: Start Time: 10:14 Pain Control: Lidocaine 4% T opical Solution T Area Debrided (L x W): otal 5.5 (cm) x 3.3 (cm) = 18.15 (cm) Tissue and other material debrided: Viable, Non-Viable, Slough, Subcutaneous, Slough Level: Skin/Subcutaneous Tissue Debridement Description: Excisional Instrument: Curette,  Forceps, Scissors Bleeding: Minimum Hemostasis Achieved: Pressure Procedural Pain: 2 Post Procedural Pain: 1 Response to Treatment: Procedure was tolerated well Level of Consciousness (Post- Awake and Alert procedure): Post Debridement Measurements of Total Wound Length: (cm) 7 Width: (cm) 3.3 Depth: (cm) 0.1 Volume: (cm) 1.814 Character of Wound/Ulcer Post Debridement: Requires Further Debridement Severity of Tissue Post Debridement: Fat layer exposed Post Procedure Diagnosis Same as Pre-procedure Notes scribed by Baruch Gouty, RN for Dr. [name] Electronic Signature(s) Signed: 09/20/2021 10:44:26 AM By: [name] MD FACS Signed: 09/20/2021 5:22:18 PM By: Baruch Gouty RN, BSN Entered By: Baruch Gouty on 09/20/2021 10:23:39 -------------------------------------------------------------------------------- HPI Details Patient Name: Date of Service: April Austin I. 09/20/2021 9:00 A M Medical Record Number: [id number] Patient Account Number: [id number] Date of Birth/Sex: Treating RN: Aug 12, 1945 (76 y.o. F) Primary Care Provider: Domingo Mend Other Clinician: Referring Provider: Treating Provider/Extender: Gevena Cotton Weeks in Treatment: 0 History of Present Illness HPI Description: ADMISSION 11/15/2020 This is a 76 year old woman with a history of squamous cell cancer skin cancer followed by Dr. Anabel Bene dermatology. She describes having a wound on the right posterior calf dating back since September. She saw her primary doctor in early October who suggested petroleum infused gauze. The patient has been doing this. No real improvement in fact she says this is getting larger. She does not complain much of pain. She is completely uncertain about how this happened there was no trauma she simply became aware of it at some point describing it is about the size of a quarter but it has been getting progressively larger since then. She is here  for review of this. Past medical history Crohn's disease not currently on prednisone, gastroesophageal reflux disease, history of squamous cell skin cancer, history of rheumatoid arthritis, hypertension 11/16; this lady had a large superficial atypical wound on the right posterior calf. She has significant solar skin damage probably some degree of venous insufficiency but she described a rapidly  expanding wound area. Because of this and a history of skin cancer I went ahead and did a shave biopsy but we do not have the results of this at the time we saw her in clinic today. She has been using silver alginate and kerlix 11/30; shave biopsy I did last time did not show any malignancy or fungal elements. Suggestion of a stasis ulcer. With our treatment which is simply been silver alginate with kerlix that she is changing daily the wound is down dramatically almost surprising how rapidly it is reduced in surface area 12/14; patient comes in today with her wound healed this was on the right posterior calf. She has significant solar skin damage probably some degree of venous insufficiency however she described a rapidly expanding wound area on admission. Because of this and a history of skin cancer I did do a shave biopsy but nothing was really found of worry. 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 Unfortunately, the wound has failed to heal. There is thick slough on the wound surface. The periwound skin is in reasonably good condition. There is granulation tissue forming underneath the slough. Electronic Signature(s) Signed: 09/20/2021 10:32:05 AM By: [name] MD FACS Previous Signature: 09/20/2021 10:09:37 AM Version By: [name] MD  FACS Entered By: [name] on 09/20/2021 10:32:04 -------------------------------------------------------------------------------- Physical Exam Details Patient Name: Date of Service: April Austin I. 09/20/2021 9:00 A M Medical Record Number: [id number] Patient Account Number: [id number] Date of Birth/Sex: Treating RN: 13-Nov-1945 (76 y.o. F) Primary Care Provider: Other Clinician: Domingo Mend Referring Provider: Treating Provider/Extender: Gevena Cotton Weeks in Treatment: 0 Constitutional Hypertensive, asymptomatic. . . . No acute distress. Respiratory Normal work of breathing on room air.. Cardiovascular . Notes 09/20/2021: On the anterior tibial surface of the leg, there is an irregular wound consistent with the surgical history of a Mohs procedure. There is thick slough on the wound surface. The periwound skin is in reasonably good condition. There is granulation tissue forming underneath the slough. Electronic Signature(s) Signed: 09/20/2021 10:32:56 AM By: [name] MD FACS Entered By: [name] on 09/20/2021 10:32:56 -------------------------------------------------------------------------------- Physician Orders Details Patient Name: Date of Service: April Austin I. 09/20/2021 9:00 A M Medical Record Number: [id number] Patient Account Number: [id number] Date of Birth/Sex: Treating RN: 1945/10/05 (76 y.o. [name], [name] Primary Care Provider: Domingo Mend Other Clinician: Referring Provider: Treating Provider/Extender: Gevena Cotton Weeks in Treatment: 0 Verbal / Phone Orders: No Diagnosis Coding ICD-10 Coding Code Description [id number] 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d T81.31XS Disruption of external operation (surgical) wound, not elsewhere classified, sequela I10 Essential (primary) hypertension C44.722 Squamous cell carcinoma of skin  of right lower limb, including hip K50.90 Crohn's disease, unspecified, without complications S28 Unspecified severe protein-calorie malnutrition Follow-up Appointments ppointment in 1 week. - Dr. [name] RM 1 with Vaughan Basta Return A Bathing/ Shower/ Hygiene May shower with protection but do not get wound dressing(s) wet. Edema Control - Lymphedema / SCD / Other Elevate legs to the level of the heart or above for 30 minutes daily and/or when sitting, a frequency of: Avoid standing for long periods of time. Exercise regularly Wound Treatment Wound #  2 - Lower Leg Wound Laterality: Right, Lateral Peri-Wound Care: Sween Lotion (Moisturizing lotion) 1 x Per Week/30 Days Discharge Instructions: Apply moisturizing lotion as directed Prim Dressing: IODOFLEX 0.9% Cadexomer Iodine Pad 4x6 cm 1 x Per Week/30 Days ary Discharge Instructions: Apply to wound bed as instructed Secondary Dressing: Zetuvit Plus 4x8 in 1 x Per Week/30 Days Discharge Instructions: Apply over primary dressing as directed. Compression Wrap: ThreePress (3 layer compression wrap) 1 x Per Week/30 Days Discharge Instructions: Apply three layer compression as directed. Patient Medications llergies: Demerol, latex, aspirin, ciprofloxacin, codeine, Septra, lactose, adhesive tape, fentanyl A Notifications Medication Indication Start End prior to debridement 09/20/2021 lidocaine DOSE topical 4 % cream - cream topical Electronic Signature(s) Signed: 09/20/2021 10:44:26 AM By: [name] MD FACS Entered By: [name] on 09/20/2021 10:33:08 -------------------------------------------------------------------------------- Problem List Details Patient Name: Date of Service: April Austin I. 09/20/2021 9:00 A M Medical Record Number: [id number] Patient Account Number: [id number] Date of Birth/Sex: Treating RN: [birth year] (76 y.o. F) Primary Care Provider: Domingo Mend Other Clinician: Referring Provider: Treating  Provider/Extender: Gevena Cotton Weeks in Treatment: 0 Active Problems ICD-10 Encounter Code Description Active Date MDM Diagnosis L97.812 Non-pressure chronic ulcer of other part of right lower leg with fat layer 09/20/2021 No Yes exposed T81.31XS Disruption of external operation (surgical) wound, not elsewhere classified, 09/20/2021 No Yes sequela I10 Essential (primary) hypertension 09/20/2021 No Yes C44.722 Squamous cell carcinoma of skin of right lower limb, including hip 09/20/2021 No Yes K50.90 Crohn's disease, unspecified, without complications 05/24/15 No Yes E43 Unspecified severe protein-calorie malnutrition 09/20/2021 No Yes Inactive Problems Resolved Problems Electronic Signature(s) Signed: 09/20/2021 10:09:57 AM By: [name] MD FACS Previous Signature: 09/20/2021 10:06:13 AM Version By: [name] MD FACS Entered By: [name] on 09/20/2021 10:09:56 -------------------------------------------------------------------------------- Progress Note Details Patient Name: Date of Service: April Austin I. 09/20/2021 9:00 A M Medical Record Number: [id number] Patient Account Number: [id number] Date of Birth/Sex: Treating RN: [birth year] (76 y.o. F) Primary Care Provider: Domingo Mend Other Clinician: Referring Provider: Treating Provider/Extender: Caprice Kluver in Treatment: 0 Subjective Chief Complaint Information obtained from Patient 11/15/2020; patient is here for review of a wound area on the right posterior calf 09/20/2021: Patient is here for a nonhealing wound after undergoing Mohs surgery on her right lower leg. History of Present Illness (HPI) ADMISSION 11/15/2020 This is a 76 year old woman with a history of squamous cell cancer skin cancer followed by Dr. Anabel Bene dermatology. She describes having a wound on the right posterior calf dating back since September. She saw her primary doctor in  early October who suggested petroleum infused gauze. The patient has been doing this. No real improvement in fact she says this is getting larger. She does not complain much of pain. She is completely uncertain about how this happened there was no trauma she simply became aware of it at some point describing it is about the size of a quarter but it has been getting progressively larger since then. She is here for review of this. Past medical history Crohn's disease not currently on prednisone, gastroesophageal reflux disease, history of squamous cell skin cancer, history of rheumatoid arthritis, hypertension 11/16; this lady had a large superficial atypical wound on the right posterior calf. She has significant solar skin damage probably some degree of venous insufficiency but she described a rapidly expanding wound area. Because of this and a history of skin cancer I went ahead and did a  shave biopsy but we do not have the results of this at the time we saw her in clinic today. She has been using silver alginate and kerlix 11/30; shave biopsy I did last time did not show any malignancy or fungal elements. Suggestion of a stasis ulcer. With our treatment which is simply been silver alginate with kerlix that she is changing daily the wound is down dramatically almost surprising how rapidly it is reduced in surface area 12/14; patient comes in today with her wound healed this was on the right posterior calf. She has significant solar skin damage probably some degree of venous insufficiency however she described a rapidly expanding wound area on admission. Because of this and a history of skin cancer I did do a shave biopsy but nothing was really found of worry. She tells me that she had biopsies and her gastroenterologist is now saying she has "colitis" rather than Crohn's disease and she is now on steroids orally READMISSION 09/20/2021 She returns to clinic today having undergone a Mohs procedure on  her right anterior tibial surface on August 17, 2021. Unfortunately, the wound has failed to heal. There is thick slough on the wound surface. The periwound skin is in reasonably good condition. There is granulation tissue forming underneath the slough. Patient History Information obtained from Patient. Allergies adhesive tape (Severity: Mild, Reaction: Redness and skin peeling), aspirin (Severity: Moderate, Reaction: Intestinal Bleeding), ciprofloxacin (Severity: Moderate, Reaction: Nausea/Vomiting), codeine (Severity: Moderate, Reaction: N/V), Demerol (Severity: Severe, Reaction: N/V), Septra (Severity: Moderate, Reaction: Nausea), latex (Severity: Severe, Reaction: Redness and skin peeling), lactose (Severity: Moderate, Reaction: Pt. has Chrons Disease), fentanyl (Reaction: N/V) Family History Diabetes - Father, Heart Disease - Father, Hypertension - Father, No family history of Cancer, Hereditary Spherocytosis, Kidney Disease, Lung Disease, Seizures, Stroke, Thyroid Problems, Tuberculosis. Social History Former smoker - quit 30 years ago, Marital Status - Married, Alcohol Use - Never, Drug Use - No History, Caffeine Use - Rarely. Medical History Eyes Patient has history of Cataracts - Had cataract surgery Denies history of Glaucoma, Optic Neuritis Ear/Nose/Mouth/Throat Patient has history of Chronic sinus problems/congestion - Post Nasal drip Denies history of Middle ear problems Hematologic/Lymphatic Denies history of Anemia, Hemophilia, Human Immunodeficiency Virus, Lymphedema, Sickle Cell Disease Respiratory Denies history of Aspiration, Asthma, Chronic Obstructive Pulmonary Disease (COPD), Pneumothorax, Sleep Apnea, Tuberculosis Cardiovascular Patient has history of Hypertension Gastrointestinal Patient has history of Crohnoos - Has Chrons Disease Denies history of Cirrhosis , Colitis, Hepatitis A, Hepatitis B, Hepatitis C Endocrine Denies history of Type I Diabetes, Type II  Diabetes Genitourinary Denies history of End Stage Renal Disease Immunological Denies history of Lupus Erythematosus, Raynaudoos, Scleroderma Musculoskeletal Patient has history of Osteoarthritis - Knees Denies history of Gout, Rheumatoid Arthritis, Osteomyelitis Neurologic Denies history of Dementia, Neuropathy, Quadriplegia, Paraplegia, Seizure Disorder Oncologic Denies history of Received Chemotherapy, Received Radiation Psychiatric Denies history of Anorexia/bulimia, Confinement Anxiety Hospitalization/Surgery History - bil TKA. - Moh's procedure right leg 08/17/21. - bil cataract extractions. - nasal fx surgery. Medical A Surgical History Notes nd Gastrointestinal Has a large hiatal hernia. Has Chrons Disease., GERD, pancreatic insufficiency Endocrine Pancreatic Insufficiency Genitourinary stage 3 Chronic Kidney disease Integumentary (Skin) Had Squamous Cell (skin cancer). Had a skin graft on Right knee (area) Musculoskeletal Had Bil knee replacements, hx cervical spine fx, sternal fx, thoracis spine fx Neurologic left foot drop Oncologic Had Squamous Cell (skin cell) Review of Systems (ROS) Constitutional Symptoms (General Health) Denies complaints or symptoms of Fatigue, Fever, Chills, Marked Weight Change. Eyes Complains  or has symptoms of Glasses / Contacts. Denies complaints or symptoms of Dry Eyes, Vision Changes. Ear/Nose/Mouth/Throat Denies complaints or symptoms of Chronic sinus problems or rhinitis. Respiratory Denies complaints or symptoms of Chronic or frequent coughs, Shortness of Breath. Cardiovascular Denies complaints or symptoms of Chest pain. Gastrointestinal Complains or has symptoms of Frequent diarrhea. Endocrine Denies complaints or symptoms of Heat/cold intolerance. Genitourinary Denies complaints or symptoms of Frequent urination. Integumentary (Skin) Complains or has symptoms of Wounds - right lower leg. Neurologic Complains or has  symptoms of Numbness/parasthesias - bil feet. Psychiatric Denies complaints or symptoms of Claustrophobia, Suicidal. Objective Constitutional Hypertensive, asymptomatic. No acute distress. Vitals Time Taken: 9:33 AM, Height: 60 in, Source: Stated, Weight: 101 lbs, Source: Stated, BMI: 19.7, Temperature: 97.9 F, Pulse: 96 bpm, Respiratory Rate: 18 breaths/min, Blood Pressure: 179/88 mmHg. Respiratory Normal work of breathing on room air.. General Notes: 09/20/2021: On the anterior tibial surface of the leg, there is an irregular wound consistent with the surgical history of a Mohs procedure. There is thick slough on the wound surface. The periwound skin is in reasonably good condition. There is granulation tissue forming underneath the slough. Integumentary (Hair, Skin) Wound #2 status is Open. Original cause of wound was Surgical Injury. The date acquired was: 08/17/2021. The wound is located on the Right,Lateral Lower Leg. The wound measures 7cm length x 3.3cm width x 0.1cm depth; 18.143cm^2 area and 1.814cm^3 volume. There is Fat Layer (Subcutaneous Tissue) exposed. There is no tunneling or undermining noted. There is a medium amount of purulent drainage noted. Foul odor after cleansing was noted. The wound margin is distinct with the outline attached to the wound base. There is small (1-33%) red, friable granulation within the wound bed. There is a large (67-100%) amount of necrotic tissue within the wound bed including Adherent Slough. Assessment Active Problems ICD-10 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d Disruption of external operation (surgical) wound, not elsewhere classified, sequela Essential (primary) hypertension Squamous cell carcinoma of skin of right lower limb, including hip Crohn's disease, unspecified, without complications Unspecified severe protein-calorie malnutrition Procedures Wound #2 Pre-procedure diagnosis of Wound  #2 is an Open Surgical Wound located on the Right,Lateral Lower Leg .Severity of Tissue Pre Debridement is: Fat layer exposed. There was a Excisional Skin/Subcutaneous Tissue Debridement with a total area of 18.15 sq cm performed by [name], MD. With the following instrument(s): Curette, Forceps, and Scissors to remove Viable and Non-Viable tissue/material. Material removed includes Subcutaneous Tissue and Slough and after achieving pain control using Lidocaine 4% T opical Solution. No specimens were taken. A time out was conducted at 10:10, prior to the start of the procedure. A Minimum amount of bleeding was controlled with Pressure. The procedure was tolerated well with a pain level of 2 throughout and a pain level of 1 following the procedure. Post Debridement Measurements: 7cm length x 3.3cm width x 0.1cm depth; 1.814cm^3 volume. Character of Wound/Ulcer Post Debridement requires further debridement. Severity of Tissue Post Debridement is: Fat layer exposed. Post procedure Diagnosis Wound #2: Same as Pre-Procedure General Notes: scribed by Baruch Gouty, RN for Dr. [name]. Pre-procedure diagnosis of Wound #2 is an Open Surgical Wound located on the Right,Lateral Lower Leg . There was a Three Layer Compression Therapy Procedure by Baruch Gouty, RN. Post procedure Diagnosis Wound #2: Same as Pre-Procedure Plan Follow-up Appointments: Return Appointment in 1 week. - Dr. [name] RM 1 with Royce Macadamia Shower/ Hygiene: May shower with protection but  do not get wound dressing(s) wet. Edema Control - Lymphedema / SCD / Other: Elevate legs to the level of the heart or above for 30 minutes daily and/or when sitting, a frequency of: Avoid standing for long periods of time. Exercise regularly The following medication(s) was prescribed: lidocaine topical 4 % cream cream topical for prior to debridement was prescribed at facility WOUND #2: - Lower Leg Wound Laterality: Right,  Lateral Peri-Wound Care: Sween Lotion (Moisturizing lotion) 1 x Per Week/30 Days Discharge Instructions: Apply moisturizing lotion as directed Prim Dressing: IODOFLEX 0.9% Cadexomer Iodine Pad 4x6 cm 1 x Per Week/30 Days ary Discharge Instructions: Apply to wound bed as instructed Secondary Dressing: Zetuvit Plus 4x8 in 1 x Per Week/30 Days Discharge Instructions: Apply over primary dressing as directed. Com pression Wrap: ThreePress (3 layer compression wrap) 1 x Per Week/30 Days Discharge Instructions: Apply three layer compression as directed. 09/20/2021: This is a 76 year old woman who returns to our clinic with a new wound. On the anterior tibial surface of the leg, there is an irregular wound consistent with the surgical history of a Mohs procedure. There is thick slough on the wound surface. The periwound skin is in reasonably good condition. There is granulation tissue forming underneath the slough. I used a curette to debride the slough off of the wound, along with some nonviable subcutaneous tissue. I think she would benefit from additional chemical debridement so we will use Iodoflex and 3 layer compression. Follow-up in 1 week. Electronic Signature(s) Signed: 09/20/2021 10:34:06 AM By: [name] MD FACS Entered By: [name] on 09/20/2021 10:34:06 -------------------------------------------------------------------------------- HxROS Details Patient Name: Date of Service: April Austin I. 09/20/2021 9:00 [place] Record Number: [id number] Patient Account Number: [id number] Date of Birth/Sex: Treating RN: 03-Dec-1945 (76 y.o. [name] Primary Care Provider: Domingo Mend Other Clinician: Referring Provider: Treating Provider/Extender: Gevena Cotton Weeks in Treatment: 0 Information Obtained From Patient Constitutional Symptoms (General Health) Complaints and Symptoms: Negative for: Fatigue; Fever; Chills; Marked Weight  Change Eyes Complaints and Symptoms: Positive for: Glasses / Contacts Negative for: Dry Eyes; Vision Changes Medical History: Positive for: Cataracts - Had cataract surgery Negative for: Glaucoma; Optic Neuritis Ear/Nose/Mouth/Throat Complaints and Symptoms: Negative for: Chronic sinus problems or rhinitis Medical History: Positive for: Chronic sinus problems/congestion - Post Nasal drip Negative for: Middle ear problems Respiratory Complaints and Symptoms: Negative for: Chronic or frequent coughs; Shortness of Breath Medical History: Negative for: Aspiration; Asthma; Chronic Obstructive Pulmonary Disease (COPD); Pneumothorax; Sleep Apnea; Tuberculosis Cardiovascular Complaints and Symptoms: Negative for: Chest pain Medical History: Positive for: Hypertension Gastrointestinal Complaints and Symptoms: Positive for: Frequent diarrhea Medical History: Positive for: Crohns - Has Chrons Disease Negative for: Cirrhosis ; Colitis; Hepatitis A; Hepatitis B; Hepatitis C Past Medical History Notes: Has a large hiatal hernia. Has Chrons Disease., GERD, pancreatic insufficiency Endocrine Complaints and Symptoms: Negative for: Heat/cold intolerance Medical History: Negative for: Type I Diabetes; Type II Diabetes Past Medical History Notes: Pancreatic Insufficiency Genitourinary Complaints and Symptoms: Negative for: Frequent urination Medical History: Negative for: End Stage Renal Disease Past Medical History Notes: stage 3 Chronic Kidney disease Integumentary (Skin) Complaints and Symptoms: Positive for: Wounds - right lower leg Medical History: Past Medical History Notes: Had Squamous Cell (skin cancer). Had a skin graft on Right knee (area) Neurologic Complaints and Symptoms: Positive for: Numbness/parasthesias - bil feet Medical History: Negative for: Dementia; Neuropathy; Quadriplegia; Paraplegia; Seizure Disorder Past Medical History Notes: left foot  drop Psychiatric Complaints  and Symptoms: Negative for: Claustrophobia; Suicidal Medical History: Negative for: Anorexia/bulimia; Confinement Anxiety Hematologic/Lymphatic Medical History: Negative for: Anemia; Hemophilia; Human Immunodeficiency Virus; Lymphedema; Sickle Cell Disease Immunological Medical History: Negative for: Lupus Erythematosus; Raynauds; Scleroderma Musculoskeletal Medical History: Positive for: Osteoarthritis - Knees Negative for: Gout; Rheumatoid Arthritis; Osteomyelitis Past Medical History Notes: Had Bil knee replacements, hx cervical spine fx, sternal fx, thoracis spine fx Oncologic Medical History: Negative for: Received Chemotherapy; Received Radiation Past Medical History Notes: Had Squamous Cell (skin cell) HBO Extended History Items Ear/Nose/Mouth/Throat: Eyes: Chronic sinus Cataracts problems/congestion Immunizations Pneumococcal Vaccine: Received Pneumococcal Vaccination: Yes Received Pneumococcal Vaccination On or After 60th Birthday: Yes Tetanus Vaccine: Last tetanus shot: 11/16/2011 Implantable Devices None Hospitalization / Surgery History Type of Hospitalization/Surgery bil TKA Moh's procedure right leg 08/17/21 bil cataract extractions nasal fx surgery Family and Social History Cancer: No; Diabetes: Yes - Father; Heart Disease: Yes - Father; Hereditary Spherocytosis: No; Hypertension: Yes - Father; Kidney Disease: No; Lung Disease: No; Seizures: No; Stroke: No; Thyroid Problems: No; Tuberculosis: No; Former smoker - quit 30 years ago; Marital Status - Married; Alcohol Use: Never; Drug Use: No History; Caffeine Use: Rarely; Financial Concerns: No; Food, Clothing or Shelter Needs: No; Support System Lacking: No; Transportation Concerns: No Electronic Signature(s) Signed: 09/20/2021 10:03:28 AM By: [name] MD FACS Signed: 09/20/2021 5:22:18 PM By: Baruch Gouty RN, BSN Entered By: Baruch Gouty on 09/20/2021  09:45:25 -------------------------------------------------------------------------------- SuperBill Details Patient Name: Date of Service: April Austin I. 09/20/2021 Medical Record Number: [id number] Patient Account Number: [id number] Date of Birth/Sex: Treating RN: [birth year] (76 y.o. 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, including hip K50.90 Crohn's disease, unspecified, without complications N62 Unspecified severe protein-calorie malnutrition Facility Procedures CPT4 Code: 95284132 Description: Coates VISIT-LEV 3 EST PT Modifier: 25 Quantity: 1 CPT4 Code: 44010272 Description: 11042 - DEB SUBQ TISSUE 20 SQ CM/< ICD-10 Diagnosis Description L97.812 Non-pressure chronic ulcer of other part of right lower leg with fat layer expo Modifier: sed Quantity: 1 Physician Procedures : CPT4 Code Description Modifier [id number] - WC PHYS LEVEL 4 - EST PT 25 ICD-10 Diagnosis Description L97.812 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d T81.31XS Disruption of external operation (surgical)  wound, not elsewhere classified, sequela 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

## 2021-09-27 ENCOUNTER — Other Ambulatory Visit: Payer: Self-pay

## 2021-09-27 DIAGNOSIS — G5732 Lesion of lateral popliteal nerve, left lower limb: Secondary | ICD-10-CM

## 2021-09-28 ENCOUNTER — Telehealth: Payer: Self-pay | Admitting: Pulmonary Disease

## 2021-09-28 ENCOUNTER — Encounter (HOSPITAL_BASED_OUTPATIENT_CLINIC_OR_DEPARTMENT_OTHER): Payer: Medicare Other | Admitting: General Surgery

## 2021-09-28 DIAGNOSIS — L97812 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d: Secondary | ICD-10-CM | POA: Diagnosis not present

## 2021-09-28 NOTE — Progress Notes (Signed)
[name] ([id number]) Visit Report for 09/28/2021 Arrival Information Details Patient Name: Date of Service: [name], [name] 09/28/2021 1:15 PM Medical Record Number: [id number] Patient Account Number: [id number] Date of Birth/Sex: Treating RN: May 27, 1945 (76 y.o. [name] Primary Care Warnell Rasnic: Domingo Mend Other Clinician: Referring Kewon Statler: Treating Jatziri Goffredo/Extender: Gevena Cotton Weeks in Treatment: 1 Visit Information History Since Last Visit Added or deleted any medications: No Patient Arrived: Ambulatory Any new allergies or adverse reactions: No Arrival Time: 13:39 Had a fall or experienced change in No Accompanied By: self activities of daily living that may affect Transfer Assistance: None risk of falls: Patient Identification Verified: Yes Signs or symptoms of abuse/neglect since last visito No Secondary Verification Process Completed: Yes Hospitalized since last visit: No Patient Requires Transmission-Based Precautions: No Implantable device outside of the clinic excluding No Patient Has Alerts: No cellular tissue based products placed in the center since last visit: Has Dressing in Place as Prescribed: Yes Has Compression in Place as Prescribed: Yes Pain Present Now: No Electronic Signature(s) Signed: 09/28/2021 5:09:34 PM By: Baruch Gouty RN, BSN Entered By: Baruch Gouty on 09/28/2021 13:40:38 -------------------------------------------------------------------------------- Compression Therapy Details Patient Name: Date of Service: April Austin I. 09/28/2021 1:15 PM Medical Record Number: [id number] Patient Account Number: [id number] Date of Birth/Sex: Treating RN: 1945-05-28 (76 y.o. [name] Primary Care Shaneequa Bahner: Domingo Mend Other Clinician: Referring Megin Consalvo: Treating Rhyan Wolters/Extender: Gevena Cotton Weeks in Treatment: 1 Compression Therapy Performed for Wound  Assessment: Wound #2 Right,Lateral Lower Leg Performed By: Clinician Baruch Gouty, RN Compression Type: Three Layer Post Procedure Diagnosis Same as Pre-procedure Electronic Signature(s) Signed: 09/28/2021 5:09:34 PM By: Baruch Gouty RN, BSN Entered By: Baruch Gouty on 09/28/2021 13:59:14 -------------------------------------------------------------------------------- Lower Extremity Assessment Details Patient Name: Date of Service: [name] 09/28/2021 1:15 PM Medical Record Number: [id number] Patient Account Number: [id number] Date of Birth/Sex: Treating RN: 19-Jun-1945 (76 y.o. [name] Primary Care Symphoni Helbling: Domingo Mend Other Clinician: Referring Vernel Donlan: Treating Coreen Shippee/Extender: Gevena Cotton Weeks in Treatment: 1 Edema Assessment Assessed: [Left: No] [Right: No] Edema: [Left: Ye] [Right: s] Calf Left: Right: Point of Measurement: From Medial Instep 28 cm Ankle Left: Right: Point of Measurement: From Medial Instep 18.5 cm Vascular Assessment Pulses: Dorsalis Pedis Palpable: [Right:Yes] Electronic Signature(s) Signed: 09/28/2021 5:09:34 PM By: Baruch Gouty RN, BSN Entered By: Baruch Gouty on 09/28/2021 13:42:48 -------------------------------------------------------------------------------- Multi Wound Chart Details Patient Name: Date of Service: April Austin I. 09/28/2021 1:15 PM Medical Record Number: [id number] Patient Account Number: [id number] Date of Birth/Sex: Treating RN: Jan 26, 1945 (76 y.o. 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] [N/A:N/A] Secondary Etiology: [2:Cataracts, Chronic sinus] [N/A:N/A] Comorbid History: [2:problems/congestion, Hypertension, Crohns, Osteoarthritis 08/17/2021] [N/A:N/A] Date Acquired: [2:1] [N/A:N/A] Weeks of Treatment: [2:Open] [N/A:N/A] Wound Status: [2:No] [N/A:N/A] Wound Recurrence: [2:Yes] [N/A:N/A] Clustered Wound: [2:6.5x3x0.1] [N/A:N/A] Measurements L x W x D (cm) [2:15.315] [N/A:N/A] A (cm) : rea [2:1.532] [N/A:N/A] Volume (cm) : [2:15.60%] [N/A:N/A] % Reduction in Area: [2:15.50%] [N/A:N/A] % Reduction in Volume: [2:Full Thickness Without Exposed] [N/A:N/A] Classification: [2:Support Structures Large] [N/A:N/A] Exudate Amount: [  2:Purulent] [N/A:N/A] Exudate Type: [2:yellow, brown, green] [N/A:N/A] Exudate Color: [2:Yes] [N/A:N/A] Foul Odor A Cleansing: [2:fter No] [N/A:N/A] Odor Anticipated Due to Product Use: [2:Distinct, outline attached] [N/A:N/A] Wound Margin: [2:Medium (34-66%)] [N/A:N/A] Granulation A mount: [2:Red, Friable] [N/A:N/A] Granulation Quality: [2:Medium (34-66%)] [N/A:N/A] Necrotic Amount: [2:Fat Layer (Subcutaneous Tissue): Yes N/A] Exposed Structures: [2:Fascia: No Tendon: No Muscle: No Joint: No Bone: No None] [N/A:N/A] Epithelialization: [2:Debridement - Excisional] [N/A:N/A] Debridement: Pre-procedure Verification/Time Out 14:00 [N/A:N/A] Taken: [2:Lidocaine 4% Topical Solution] [N/A:N/A] Pain Control: [2:Subcutaneous, Slough] [N/A:N/A] Tissue Debrided: [2:Skin/Subcutaneous Tissue] [N/A:N/A] Level: [2:19.5] [N/A:N/A] Debridement A (sq cm): [2:rea Curette] [N/A:N/A] Instrument: [2:Minimum] [N/A:N/A] Bleeding: [2:Pressure] [N/A:N/A] Hemostasis A chieved: [2:3] [N/A:N/A] Procedural Pain: [2:1] [N/A:N/A] Post Procedural Pain: [2:Procedure was tolerated well] [N/A:N/A] Debridement Treatment  Response: [2:6.5x3x0.1] [N/A:N/A] Post Debridement Measurements L x W x D (cm) [2:1.532] [N/A:N/A] Post Debridement Volume: (cm) [2:Compression Therapy] [N/A:N/A] Procedures Performed: [2:Debridement] Treatment Notes Electronic Signature(s) Signed: 09/28/2021 2:17:19 PM By: [name] MD FACS Signed: 09/28/2021 5:09:34 PM By: Baruch Gouty RN, BSN Entered By: [name] on 09/28/2021 14:17:19 -------------------------------------------------------------------------------- Multi-Disciplinary Care Plan Details Patient Name: Date of Service: April Austin I. 09/28/2021 1:15 PM Medical Record Number: [id number] Patient Account Number: [id number] Date of Birth/Sex: Treating RN: December 26, 1945 (76 y.o. [name] Primary Care Ayda Tancredi: Domingo Mend Other Clinician: Referring Shandell Jallow: Treating Kamyra Schroeck/Extender: Caprice Kluver in Treatment: 1 Multidisciplinary Care Plan reviewed with physician Active Inactive Abuse / Safety / Falls / Self Care Management Nursing Diagnoses: History of Falls Goals: Patient/caregiver will verbalize/demonstrate measures taken to prevent injury and/or falls Date Initiated: 09/20/2021 Target Resolution Date: 10/18/2021 Goal Status: Active Interventions: Assess fall risk on admission and as needed Assess impairment of mobility on admission and as needed per policy Notes: Wound/Skin Impairment Nursing Diagnoses: Impaired tissue integrity Knowledge deficit related to ulceration/compromised skin integrity Goals: Patient/caregiver will verbalize understanding of skin care regimen Date Initiated: 09/20/2021 Target Resolution Date: 10/18/2021 Goal Status: Active Ulcer/skin breakdown will have a volume reduction of 30% by week 4 Date Initiated: 09/20/2021 Target Resolution Date: 10/18/2021 Goal Status: Active Interventions: Assess patient/caregiver ability to obtain necessary supplies Assess  patient/caregiver ability to perform ulcer/skin care regimen upon admission and as needed Assess ulceration(s) every visit Provide education on ulcer and skin care Treatment Activities: Skin care regimen initiated : 09/20/2021 Topical wound management initiated : 09/20/2021 Notes: Electronic Signature(s) Signed: 09/28/2021 5:09:34 PM By: Baruch Gouty RN, BSN Entered By: Baruch Gouty on 09/28/2021 13:20:20 -------------------------------------------------------------------------------- Pain Assessment Details Patient Name: Date of Service: April Austin I. 09/28/2021 1:15 PM Medical Record Number: [id number] Patient Account Number: [id number] Date of Birth/Sex: Treating RN: [birth year] (76 y.o. [name] Primary Care Elmer Merwin: Domingo Mend Other Clinician: Referring Gardiner Espana: Treating Kaiser Belluomini/Extender: Gevena Cotton Weeks in Treatment: 1 Active Problems Location of Pain Severity and Description of Pain Patient Has Paino No Site Locations Rate the pain. Rate the pain. Current Pain Level: 0 Pain Management and Medication Current Pain Management: Electronic Signature(s) Signed: 09/28/2021 5:09:34 PM By: Baruch Gouty RN, BSN Entered By: Baruch Gouty on 09/28/2021 13:41:22 -------------------------------------------------------------------------------- Patient/Caregiver Education Details Patient Name: Date of Service: Karma Ganja 9/22/2023andnbsp1:15 PM Medical Record Number: [id number] Patient Account Number: [id number] Date of Birth/Gender: Treating RN: Sep 02, 1945 (76 y.o. [name] Primary Care Physician: Domingo Mend Other Clinician: Referring Physician: Treating Physician/Extender: Caprice Kluver in Treatment: 1 Education Assessment Education Provided To: Patient Education Topics Provided Wound/Skin  Impairment: Methods: Explain/Verbal Responses: Reinforcements needed,  State content correctly Electronic Signature(s) Signed: 09/28/2021 5:09:34 PM By: Baruch Gouty RN, BSN Entered By: Baruch Gouty on 09/28/2021 13:20:33 -------------------------------------------------------------------------------- Wound Assessment Details Patient Name: Date of Service: JACQULYNN, SHAPPELL I. 09/28/2021 1:15 PM Medical Record Number: [id number] Patient Account Number: [id number] Date of Birth/Sex: Treating RN: 1945/05/29 (76 y.o. [name] Primary Care Yarielys Beed: Domingo Mend Other Clinician: Referring Avamarie Crossley: Treating Kaylani Fromme/Extender: Gevena Cotton Weeks in Treatment: 1 Wound Status Wound Number: 2 Primary Open Surgical Wound Etiology: Wound Location: Right, Lateral Lower Leg Secondary Venous Leg Ulcer Wounding Event: Surgical Injury Etiology: Date Acquired: 08/17/2021 Wound Status: Open Weeks Of Treatment: 1 Comorbid Cataracts, Chronic sinus problems/congestion, Hypertension, Clustered Wound: Yes History: Crohns, Osteoarthritis Photos Wound Measurements Length: (cm) 6.5 Width: (cm) 3 Depth: (cm) 0.1 Area: (cm) 15.315 Volume: (cm) 1.532 % Reduction in Area: 15.6% % Reduction in Volume: 15.5% Epithelialization: None Tunneling: No Undermining: No Wound Description Classification: Full Thickness Without Exposed Support Structures Wound Margin: Distinct, outline attached Exudate Amount: Large Exudate Type: Purulent Exudate Color: yellow, brown, green Foul Odor After Cleansing: Yes Due to Product Use: No Slough/Fibrino Yes Wound Bed Granulation Amount: Medium (34-66%) Exposed Structure Granulation Quality: Red, Friable Fascia Exposed: No Necrotic Amount: Medium (34-66%) Fat Layer (Subcutaneous Tissue) Exposed: Yes Tendon Exposed: No Muscle Exposed: No Joint Exposed: No Bone Exposed: No Electronic Signature(s) Signed: 09/28/2021 5:09:34 PM By: Baruch Gouty RN, BSN Entered By: Baruch Gouty on  09/28/2021 13:46:19 -------------------------------------------------------------------------------- Vitals Details Patient Name: Date of Service: April Austin I. 09/28/2021 1:15 PM Medical Record Number: [id number] Patient Account Number: [id number] Date of Birth/Sex: Treating RN: 10/07/1945 (76 y.o. [name] Primary Care Kennia Vanvorst: Domingo Mend Other Clinician: Referring Timia Casselman: Treating Brighid Koch/Extender: Gevena Cotton Weeks in Treatment: 1 Vital Signs Time Taken: 13:35 Temperature (F): 98.7 Height (in): 60 Pulse (bpm): 89 Weight (lbs): 101 Respiratory Rate (breaths/min): 18 Body Mass Index (BMI): 19.7 Blood Pressure (mmHg): 131/75 Reference Range: 80 - 120 mg / dl Electronic Signature(s) Signed: 09/28/2021 5:09:34 PM By: Baruch Gouty RN, BSN Entered By: Baruch Gouty on 09/28/2021 13:41:14

## 2021-09-28 NOTE — Telephone Encounter (Signed)
Fax received from Dr. Louanna Raw with [name] to perform a [place] on [name].  [name] needs surgery clearance. Surgery is PENDING. [name] was seen on 06/29/2021. Office protocol is a risk assessment can be sent to surgeon if [name] has been seen in 60 days or less.   Sending to Dr Erin Fulling for risk assessment or recommendations if [name] needs to be seen in office prior to surgical procedure.    [name] has office visit with Dr Erin Fulling on 10/01/2021 and can possibly get cleared for surgery at that visit.

## 2021-09-28 NOTE — Progress Notes (Signed)
NAJMO, PARDUE ([id number]) Visit Report for 09/28/2021 Chief Complaint Document Details Patient Name: Date of Service: [name], ESQUEDA 09/28/2021 1:15 PM Medical Record Number: [id number] Patient Account Number: [id number] Date of Birth/Sex: Treating RN: 29-Dec-1945 (76 y.o. [name] Primary Care Provider: Domingo Mend Other Clinician: Referring Provider: Treating Provider/Extender: Caprice Kluver in Treatment: 1 Information Obtained from: Patient Chief Complaint 11/15/2020; patient is here for review of a wound area on the right posterior calf 09/20/2021: Patient is here for a nonhealing wound after undergoing Mohs surgery on her right lower leg. Electronic Signature(s) Signed: 09/28/2021 2:17:25 PM By: [name] MD FACS Entered By: [name] on 09/28/2021 14:17:25 -------------------------------------------------------------------------------- Debridement Details Patient Name: Date of Service: April Austin I. 09/28/2021 1:15 PM Medical Record Number: [id number] Patient Account Number: [id number] Date of Birth/Sex: Treating RN: 12/13/1945 (76 y.o. [name], [name] Primary Care Provider: Domingo Mend Other Clinician: Referring Provider: Treating Provider/Extender: Gevena Cotton Weeks in Treatment: 1 Debridement Performed for Assessment: Wound #2 Right,Lateral Lower Leg Performed By: Physician [name], MD Debridement Type: Debridement Severity of Tissue Pre Debridement: Fat layer exposed Level of Consciousness (Pre-procedure): Awake and Alert Pre-procedure Verification/Time Out Yes - 14:00 Taken: Start Time: 14:00 Pain Control: Lidocaine 4% T opical Solution T Area Debrided (L x W): otal 6.5 (cm) x 3 (cm) = 19.5 (cm) Tissue and other material debrided: Viable, Non-Viable, Slough, Subcutaneous, Slough Level: Skin/Subcutaneous Tissue Debridement Description: Excisional Instrument:  Curette Specimen: Tissue Culture Number of Specimens T aken: 1 Bleeding: Minimum Hemostasis Achieved: Pressure Procedural Pain: 3 Post Procedural Pain: 1 Response to Treatment: Procedure was tolerated well Level of Consciousness (Post- Awake and Alert procedure): Post Debridement Measurements of Total Wound Length: (cm) 6.5 Width: (cm) 3 Depth: (cm) 0.1 Volume: (cm) 1.532 Character of Wound/Ulcer Post Debridement: Requires Further Debridement Severity of Tissue Post Debridement: Fat layer exposed Post Procedure Diagnosis Same as Pre-procedure Electronic Signature(s) Signed: 09/28/2021 2:24:58 PM By: [name] MD FACS Signed: 09/28/2021 5:09:34 PM By: Baruch Gouty RN, BSN Entered By: Baruch Gouty on 09/28/2021 14:04:54 -------------------------------------------------------------------------------- HPI Details Patient Name: Date of Service: April Austin I. 09/28/2021 1:15 PM Medical Record Number: [id number] Patient Account Number: [id number] Date of Birth/Sex: Treating RN: 03-Sep-1945 (76 y.o. [name] Primary Care Provider: Domingo Mend Other Clinician: Referring Provider: Treating Provider/Extender: Gevena Cotton Weeks in Treatment: 1 History of Present Illness HPI Description: ADMISSION 11/15/2020 This is a 76 year old woman with a history of squamous cell cancer skin cancer followed by Dr. Anabel Bene dermatology. She describes having a wound on the right posterior calf dating back since September. She saw her primary doctor in early October who suggested petroleum infused gauze. The patient has been doing this. No real improvement in fact she says this is getting larger. She does not complain much of pain. She is completely uncertain about how this happened there was no trauma she simply became aware of it at some point describing it is about the size of a quarter but it has been getting progressively larger since then. She  is here for review of this. Past medical history Crohn's disease not currently on prednisone, gastroesophageal reflux disease, history of squamous cell skin cancer, history of rheumatoid arthritis, hypertension 11/16; this lady had a large superficial atypical wound on the right posterior calf. She has significant solar skin damage probably some degree of venous insufficiency but she described a rapidly expanding  wound area. Because of this and a history of skin cancer I went ahead and did a shave biopsy but we do not have the results of this at the time we saw her in clinic today. She has been using silver alginate and kerlix 11/30; shave biopsy I did last time did not show any malignancy or fungal elements. Suggestion of a stasis ulcer. With our treatment which is simply been silver alginate with kerlix that she is changing daily the wound is down dramatically almost surprising how rapidly it is reduced in surface area 12/14; patient comes in today with her wound healed this was on the right posterior calf. She has significant solar skin damage probably some degree of venous insufficiency however she described a rapidly expanding wound area on admission. Because of this and a history of skin cancer I did do a shave biopsy but nothing was really found of worry. 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 Unfortunately, the wound has failed to heal. There is thick slough on the wound surface. The periwound skin is in reasonably good condition. There is granulation tissue forming underneath the slough. 09/28/2021: The wound has deteriorated rather significantly over the past week. She had fairly copious drainage that she noticed as early as Monday, but  she did not contact our office. Today there is a lot of periwound skin breakdown and a very pungent odor coming from the wound. There is thick slough on the wound surface but there is still good granulation tissue underlying this. Despite this, however, the wound actually measures smaller. Electronic Signature(s) Signed: 09/28/2021 2:19:33 PM By: [name] MD FACS Entered By: [name] on 09/28/2021 14:19:32 -------------------------------------------------------------------------------- Physical Exam Details Patient Name: Date of Service: ADONICA, FUKUSHIMA 09/28/2021 1:15 PM Medical Record Number: [id number] Patient Account Number: [id number] Date of Birth/Sex: Treating RN: 07/08/1945 (76 y.o. [name] Primary Care Provider: Domingo Mend Other Clinician: Referring Provider: Treating Provider/Extender: Gevena Cotton Weeks in Treatment: 1 Constitutional . . . . No acute distress.Marland Kitchen Respiratory . Notes 09/28/2021: The wound has deteriorated rather significantly over the past week. She had fairly copious drainage. Today there is a lot of periwound skin breakdown and a very pungent odor coming from the wound. There is thick slough on the wound surface but there is still good granulation tissue underlying this. Despite this, however, the wound actually measures smaller. Electronic Signature(s) Signed: 09/28/2021 2:21:23 PM By: [name] MD FACS Entered By: [name] on 09/28/2021 14:21:23 -------------------------------------------------------------------------------- Physician Orders Details Patient Name: Date of Service: April Austin I. 09/28/2021 1:15 PM Medical Record Number: [id number] Patient Account Number: [id number] Date of Birth/Sex: Treating RN: 1945-06-07 (76 y.o. [name], [name] Primary Care Provider: Domingo Mend Other Clinician: Referring Provider: Treating Provider/Extender: Gevena Cotton Weeks in Treatment: 1 Verbal / Phone Orders: No Diagnosis Coding ICD-10 Coding Code Description [phone] 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d T81.31XS Disruption of external operation (surgical) wound, not elsewhere classified, sequela I10 Essential (primary) hypertension C44.722 Squamous cell carcinoma of skin of right lower limb, including hip K50.90 Crohn's disease, unspecified, without complications V25 Unspecified severe protein-calorie malnutrition Follow-up Appointments ppointment in 1  week. - Dr. [name] RM 1 with Vaughan Basta Return A Nurse Visit: - Tuesday for rewrap Bathing/ Shower/ Hygiene May shower with protection but do not get wound dressing(s) wet. Edema Control - Lymphedema / SCD / Other Elevate legs to the level of the heart or above for 30 minutes daily and/or when sitting, a frequency of: Avoid standing for long periods of time. Exercise regularly Wound Treatment Wound #2 - Lower Leg Wound Laterality: Right, Lateral Peri-Wound Care: Zinc Oxide Ointment 30g tube 1 x Per Week/30 Days Discharge Instructions: Apply Zinc Oxide to periwound with each dressing change Peri-Wound Care: Sween Lotion (Moisturizing lotion) 1 x Per Week/30 Days Discharge Instructions: Apply moisturizing lotion as directed Topical: Gentamicin 1 x Per Week/30 Days Discharge Instructions: mix with santyl to wound bed Prim Dressing: Santyl Ointment 1 x Per Week/30 Days ary Discharge Instructions: Apply nickel thick amount to wound bed as instructed Secondary Dressing: CarboFLEX Odor Control Dressing, 4x4 in 1 x Per Week/30 Days Discharge Instructions: Apply over primary dressing as directed. Secondary Dressing: Woven Gauze Sponge, Non-Sterile 4x4 in 1 x Per Week/30 Days Discharge Instructions: Apply over primary dressing as directed. Compression Wrap: ThreePress (3 layer compression wrap) 1 x Per Week/30 Days Discharge  Instructions: Apply three layer compression as directed. Laboratory naerobe culture (MICRO) Bacteria identified in Unspecified specimen by A LOINC Code: 595-6 Convenience Name: Anaerobic culture Patient Medications llergies: Demerol, latex, aspirin, ciprofloxacin, codeine, Septra, lactose, adhesive tape, fentanyl A Notifications Medication Indication Start End 09/28/2021 amoxicillin-pot clavulanate DOSE oral 875 mg-125 mg tablet - 1 tab p.o. twice daily x10 days Electronic Signature(s) Signed: 09/28/2021 2:22:25 PM By: [name] MD FACS Entered By: [name] on 09/28/2021 14:22:25 -------------------------------------------------------------------------------- Problem List Details Patient Name: Date of Service: April Austin I. 09/28/2021 1:15 PM Medical Record Number: [id number] Patient Account Number: [id number] Date of Birth/Sex: Treating RN: 11-Jan-1945 (76 y.o. [name] Primary Care Provider: Domingo Mend Other Clinician: Referring Provider: Treating Provider/Extender: Gevena Cotton Weeks in Treatment: 1 Active Problems ICD-10 Encounter Code Description Active Date MDM Diagnosis L97.812 Non-pressure chronic ulcer of other part of right lower leg with fat layer 09/20/2021 No Yes exposed T81.31XS Disruption of external operation (surgical) wound, not elsewhere classified, 09/20/2021 No Yes sequela I10 Essential (primary) hypertension 09/20/2021 No Yes C44.722 Squamous cell carcinoma of skin of right lower limb, including hip 09/20/2021 No Yes K50.90 Crohn's disease, unspecified, without complications 9/51/8841 No Yes E43 Unspecified severe protein-calorie malnutrition 09/20/2021 No Yes Inactive Problems Resolved Problems Electronic Signature(s) Signed: 09/28/2021 2:17:11 PM By: [name] MD FACS Entered By: [name] on 09/28/2021  14:17:11 -------------------------------------------------------------------------------- Progress Note Details Patient Name: Date of Service: April Austin I. 09/28/2021 1:15 PM Medical Record Number: [id number] Patient Account Number: [id number] Date of Birth/Sex: Treating RN: [birth year] (76 y.o. [name] Primary Care Provider: Domingo Mend Other Clinician: Referring Provider: Treating Provider/Extender: Caprice Kluver in Treatment: 1 Subjective Chief Complaint Information obtained from Patient 11/15/2020; patient is here for review of a wound area on the right posterior calf 09/20/2021: Patient is here for a nonhealing wound after undergoing Mohs surgery on her right lower leg. History of Present Illness (HPI) ADMISSION 11/15/2020 This is a 76 year old woman with a history of squamous cell cancer skin cancer followed by Dr. Anabel Bene dermatology. She describes having a wound on the right posterior calf dating back since September. She saw her primary doctor in early October who suggested petroleum infused gauze.  The patient has been doing this. No real improvement in fact she says this is getting larger. She does not complain much of pain. She is completely uncertain about how this happened there was no trauma she simply became aware of it at some point describing it is about the size of a quarter but it has been getting progressively larger since then. She is here for review of this. Past medical history Crohn's disease not currently on prednisone, gastroesophageal reflux disease, history of squamous cell skin cancer, history of rheumatoid arthritis, hypertension 11/16; this lady had a large superficial atypical wound on the right posterior calf. She has significant solar skin damage probably some degree of venous insufficiency but she described a rapidly expanding wound area. Because of this and a history of skin cancer I went ahead and did a  shave biopsy but we do not have the results of this at the time we saw her in clinic today. She has been using silver alginate and kerlix 11/30; shave biopsy I did last time did not show any malignancy or fungal elements. Suggestion of a stasis ulcer. With our treatment which is simply been silver alginate with kerlix that she is changing daily the wound is down dramatically almost surprising how rapidly it is reduced in surface area 12/14; patient comes in today with her wound healed this was on the right posterior calf. She has significant solar skin damage probably some degree of venous insufficiency however she described a rapidly expanding wound area on admission. Because of this and a history of skin cancer I did do a shave biopsy but nothing was really found of worry. 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 Unfortunately, the wound has failed to heal. There is thick slough on the wound surface. The periwound skin is in reasonably good condition. There is granulation tissue forming underneath the slough. 09/28/2021: The wound has deteriorated rather significantly over the past week. She had fairly copious drainage that she noticed as early as Monday, but she did not contact our office. Today there is a lot of periwound skin breakdown and a very pungent odor coming from the wound. There is thick slough on the wound surface but there is still good granulation tissue underlying this. Despite this, however, the wound actually measures smaller. Patient History Information obtained from Patient. Family History Diabetes - Father, Heart Disease - Father, Hypertension - Father, No family history of Cancer, Hereditary Spherocytosis, Kidney Disease,  Lung Disease, Seizures, Stroke, Thyroid Problems, Tuberculosis. Social History Former smoker - quit 30 years ago, Marital Status - Married, Alcohol Use - Never, Drug Use - No History, Caffeine Use - Rarely. Medical History Eyes Patient has history of Cataracts - Had cataract surgery Denies history of Glaucoma, Optic Neuritis Ear/Nose/Mouth/Throat Patient has history of Chronic sinus problems/congestion - Post Nasal drip Denies history of Middle ear problems Hematologic/Lymphatic Denies history of Anemia, Hemophilia, Human Immunodeficiency Virus, Lymphedema, Sickle Cell Disease Respiratory Denies history of Aspiration, Asthma, Chronic Obstructive Pulmonary Disease (COPD), Pneumothorax, Sleep Apnea, Tuberculosis Cardiovascular Patient has history of Hypertension Gastrointestinal Patient has history of Crohnoos - Has Chrons Disease Denies history of Cirrhosis , Colitis, Hepatitis A, Hepatitis B, Hepatitis C Endocrine Denies history of Type I Diabetes, Type II Diabetes Genitourinary Denies  history of End Stage Renal Disease Immunological Denies history of Lupus Erythematosus, Raynaudoos, Scleroderma Musculoskeletal Patient has history of Osteoarthritis - Knees Denies history of Gout, Rheumatoid Arthritis, Osteomyelitis Neurologic Denies history of Dementia, Neuropathy, Quadriplegia, Paraplegia, Seizure Disorder Oncologic Denies history of Received Chemotherapy, Received Radiation Psychiatric Denies history of Anorexia/bulimia, Confinement Anxiety Hospitalization/Surgery History - bil TKA. - Moh's procedure right leg 08/17/21. - bil cataract extractions. - nasal fx surgery. Medical A Surgical History Notes nd Gastrointestinal Has a large hiatal hernia. Has Chrons Disease., GERD, pancreatic insufficiency Endocrine Pancreatic Insufficiency Genitourinary stage 3 Chronic Kidney disease Integumentary (Skin) Had Squamous Cell (skin cancer). Had a skin graft on Right knee  (area) Musculoskeletal Had Bil knee replacements, hx cervical spine fx, sternal fx, thoracis spine fx Neurologic left foot drop Oncologic Had Squamous Cell (skin cell) Objective Constitutional No acute distress.. Vitals Time Taken: 1:35 PM, Height: 60 in, Weight: 101 lbs, BMI: 19.7, Temperature: 98.7 F, Pulse: 89 bpm, Respiratory Rate: 18 breaths/min, Blood Pressure: 131/75 mmHg. General Notes: 09/28/2021: The wound has deteriorated rather significantly over the past week. She had fairly copious drainage. Today there is a lot of periwound skin breakdown and a very pungent odor coming from the wound. There is thick slough on the wound surface but there is still good granulation tissue underlying this. Despite this, however, the wound actually measures smaller. Integumentary (Hair, Skin) Wound #2 status is Open. Original cause of wound was Surgical Injury. The date acquired was: 08/17/2021. The wound has been in treatment 1 weeks. The wound is located on the Right,Lateral Lower Leg. The wound measures 6.5cm length x 3cm width x 0.1cm depth; 15.315cm^2 area and 1.532cm^3 volume. There is Fat Layer (Subcutaneous Tissue) exposed. There is no tunneling or undermining noted. There is a large amount of purulent drainage noted. Foul odor after cleansing was noted. The wound margin is distinct with the outline attached to the wound base. There is medium (34-66%) red, friable granulation within the wound bed. There is a medium (34-66%) amount of necrotic tissue within the wound bed. Assessment Active Problems ICD-10 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d Disruption of external operation (surgical) wound, not elsewhere classified, sequela Essential (primary) hypertension Squamous cell carcinoma of skin of right lower limb, including hip Crohn's disease, unspecified, without complications Unspecified severe protein-calorie malnutrition Procedures Wound  #2 Pre-procedure diagnosis of Wound #2 is an Open Surgical Wound located on the Right,Lateral Lower Leg .Severity of Tissue Pre Debridement is: Fat layer exposed. There was a Excisional Skin/Subcutaneous Tissue Debridement with a total area of 19.5 sq cm performed by [name], MD. With the following instrument(s): Curette to remove Viable and Non-Viable tissue/material. Material removed includes Subcutaneous Tissue and Slough and after achieving pain control using Lidocaine 4% T opical Solution. 1 specimen was taken by a Tissue Culture and sent to the lab per facility protocol. A time out was conducted at 14:00, prior to the start of the procedure. A Minimum amount of bleeding was controlled with Pressure. The procedure was tolerated well with a pain level of 3 throughout and a pain level of 1 following the procedure. Post Debridement Measurements: 6.5cm length x 3cm width x 0.1cm depth; 1.532cm^3 volume. Character of Wound/Ulcer Post Debridement requires further debridement. Severity of Tissue Post Debridement is: Fat layer exposed. Post procedure Diagnosis Wound #2: Same as Pre-Procedure Pre-procedure diagnosis of Wound #2 is an Open Surgical Wound located on the Right,Lateral Lower Leg . There was a Three  Layer Compression Therapy Procedure by Baruch Gouty, RN. Post procedure Diagnosis Wound #2: Same as Pre-Procedure Plan Follow-up Appointments: Return Appointment in 1 week. - Dr. [name] RM 1 with Vaughan Basta Nurse Visit: - Tuesday for rewrap Bathing/ Shower/ Hygiene: May shower with protection but do not get wound dressing(s) wet. Edema Control - Lymphedema / SCD / Other: Elevate legs to the level of the heart or above for 30 minutes daily and/or when sitting, a frequency of: Avoid standing for long periods of time. Exercise regularly Laboratory ordered were: Anaerobic culture The following medication(s) was prescribed: amoxicillin-pot clavulanate oral 875 mg-125 mg tablet 1 tab  p.o. twice daily x10 days starting 09/28/2021 WOUND #2: - Lower Leg Wound Laterality: Right, Lateral Peri-Wound Care: Zinc Oxide Ointment 30g tube 1 x Per Week/30 Days Discharge Instructions: Apply Zinc Oxide to periwound with each dressing change Peri-Wound Care: Sween Lotion (Moisturizing lotion) 1 x Per Week/30 Days Discharge Instructions: Apply moisturizing lotion as directed Topical: Gentamicin 1 x Per Week/30 Days Discharge Instructions: mix with santyl to wound bed Prim Dressing: Santyl Ointment 1 x Per Week/30 Days ary Discharge Instructions: Apply nickel thick amount to wound bed as instructed Secondary Dressing: CarboFLEX Odor Control Dressing, 4x4 in 1 x Per Week/30 Days Discharge Instructions: Apply over primary dressing as directed. Secondary Dressing: Woven Gauze Sponge, Non-Sterile 4x4 in 1 x Per Week/30 Days Discharge Instructions: Apply over primary dressing as directed. Com pression Wrap: ThreePress (3 layer compression wrap) 1 x Per Week/30 Days Discharge Instructions: Apply three layer compression as directed. 09/28/2021: The wound has deteriorated rather significantly over the past week. She had fairly copious drainage. Today there is a lot of periwound skin breakdown and a very pungent odor coming from the wound. There is thick slough on the wound surface but there is still good granulation tissue underlying this. Despite this, however, the wound actually measures smaller. I used a curette to debride the slough from the wound. I then took a culture. I have empirically prescribed Augmentin but we will follow-up the culture data and adjust therapy as indicated. We will apply gentamicin mixed with Santyl to her wound. We will use CarboFlex to help minimize the odor followed by 3 layer compression. We will arrange for her to have a nurse visit earlier next week for dressing change and then I will see her 1 week from today. Electronic Signature(s) Signed: 09/28/2021 2:23:59 PM  By: [name] MD FACS Entered By: [name] on 09/28/2021 14:23:59 -------------------------------------------------------------------------------- HxROS Details Patient Name: Date of Service: April Austin I. 09/28/2021 1:15 PM Medical Record Number: [id number] Patient Account Number: [id number] Date of Birth/Sex: Treating RN: 1945-01-17 (76 y.o. [name] Primary Care Provider: Domingo Mend Other Clinician: Referring Provider: Treating Provider/Extender: Gevena Cotton Weeks in Treatment: 1 Information Obtained From Patient Eyes Medical History: Positive for: Cataracts - Had cataract surgery Negative for: Glaucoma; Optic Neuritis Ear/Nose/Mouth/Throat Medical History: Positive for: Chronic sinus problems/congestion - Post Nasal drip Negative for: Middle ear problems Hematologic/Lymphatic Medical History: Negative for: Anemia; Hemophilia; Human Immunodeficiency Virus; Lymphedema; Sickle Cell Disease Respiratory Medical History: Negative for: Aspiration; Asthma; Chronic Obstructive Pulmonary Disease (COPD); Pneumothorax; Sleep Apnea; Tuberculosis Cardiovascular Medical History: Positive for: Hypertension Gastrointestinal Medical History: Positive for: Crohns - Has Chrons Disease Negative for: Cirrhosis ; Colitis; Hepatitis A; Hepatitis B; Hepatitis C Past Medical History Notes: Has a large hiatal hernia. Has Chrons Disease., GERD, pancreatic insufficiency Endocrine Medical History: Negative for: Type I Diabetes; Type II Diabetes  Past Medical History Notes: Pancreatic Insufficiency Genitourinary Medical History: Negative for: End Stage Renal Disease Past Medical History Notes: stage 3 Chronic Kidney disease Immunological Medical History: Negative for: Lupus Erythematosus; Raynauds; Scleroderma Integumentary (Skin) Medical History: Past Medical History Notes: Had Squamous Cell (skin cancer). Had a skin graft on  Right knee (area) Musculoskeletal Medical History: Positive for: Osteoarthritis - Knees Negative for: Gout; Rheumatoid Arthritis; Osteomyelitis Past Medical History Notes: Had Bil knee replacements, hx cervical spine fx, sternal fx, thoracis spine fx Neurologic Medical History: Negative for: Dementia; Neuropathy; Quadriplegia; Paraplegia; Seizure Disorder Past Medical History Notes: left foot drop Oncologic Medical History: Negative for: Received Chemotherapy; Received Radiation Past Medical History Notes: Had Squamous Cell (skin cell) Psychiatric Medical History: Negative for: Anorexia/bulimia; Confinement Anxiety HBO Extended History Items Ear/Nose/Mouth/Throat: Eyes: Chronic sinus Cataracts problems/congestion Immunizations Pneumococcal Vaccine: Received Pneumococcal Vaccination: Yes Received Pneumococcal Vaccination On or After 60th Birthday: Yes Tetanus Vaccine: Last tetanus shot: 11/16/2011 Implantable Devices None Hospitalization / Surgery History Type of Hospitalization/Surgery bil TKA Moh's procedure right leg 08/17/21 bil cataract extractions nasal fx surgery Family and Social History Cancer: No; Diabetes: Yes - Father; Heart Disease: Yes - Father; Hereditary Spherocytosis: No; Hypertension: Yes - Father; Kidney Disease: No; Lung Disease: No; Seizures: No; Stroke: No; Thyroid Problems: No; Tuberculosis: No; Former smoker - quit 30 years ago; Marital Status - Married; Alcohol Use: Never; Drug Use: No History; Caffeine Use: Rarely; Financial Concerns: No; Food, Clothing or Shelter Needs: No; Support System Lacking: No; Transportation Concerns: No Engineer, maintenance) Signed: 09/28/2021 2:24:58 PM By: [name] MD FACS Signed: 09/28/2021 5:09:34 PM By: Baruch Gouty RN, BSN Entered By: [name] on 09/28/2021 14:20:23 -------------------------------------------------------------------------------- [place] Details Patient Name: Date of  Service: April Austin I. 09/28/2021 Medical Record Number: [id number] Patient Account Number: [id number] Date of Birth/Sex: Treating RN: Sep 10, 1945 (76 y.o. 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, including hip K50.90 Crohn's disease, unspecified, without complications D43 Unspecified severe protein-calorie malnutrition Facility Procedures CPT4 Code: [id number] Description: [place] - DEB SUBQ TISSUE 20 SQ CM/< ICD-10 Diagnosis Description L97.812 Non-pressure chronic ulcer of other part of right lower leg with fat layer exp Modifier: osed Quantity: 1 Physician Procedures : CPT4 Code Description Modifier [id number] - WC PHYS LEVEL 4 - EST PT 25 ICD-10 Diagnosis Description L97.812 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d T81.31XS Disruption of external operation (surgical)  wound, not elsewhere classified, sequela E43 Unspecified severe protein-calorie malnutrition C44.722 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

## 2021-10-01 ENCOUNTER — Encounter: Payer: Self-pay | Admitting: Pulmonary Disease

## 2021-10-01 ENCOUNTER — Ambulatory Visit (INDEPENDENT_AMBULATORY_CARE_PROVIDER_SITE_OTHER): Payer: Medicare Other | Admitting: Pulmonary Disease

## 2021-10-01 VITALS — BP 118/62 | HR 86 | Ht 59.5 in | Wt 102.2 lb

## 2021-10-01 DIAGNOSIS — J432 Centrilobular emphysema: Secondary | ICD-10-CM | POA: Diagnosis not present

## 2021-10-01 DIAGNOSIS — R0982 Postnasal drip: Secondary | ICD-10-CM

## 2021-10-01 MED ORDER — IPRATROPIUM BROMIDE 0.03 % NA SOLN: 2.0000 | Freq: Two times a day (BID) | NASAL | 12 refills | Status: DC

## 2021-10-01 MED ORDER — BREZTRI AEROSPHERE 160-9-4.8 MCG/ACT IN AERO: 2.0000 | INHALATION_SPRAY | Freq: Two times a day (BID) | RESPIRATORY_TRACT | 6 refills | Status: DC
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
## 2021-10-01 NOTE — Telephone Encounter (Signed)
Surgical risk clearance addressed in previous OV note. Will re-address in today's visit.  JD

## 2021-10-01 NOTE — Patient Instructions (Addendum)
Start Breztri inhaler 2 puffs twice daily - rinse mouth out after each use  Start ipratropium nasal spray, 2 sprays per nostril twice daily as needed for nasal drainage  We will send our note to the surgical team regarding your respiratory risks for surgery  We will schedule you for pulmonary function tests  Follow up in 4 months

## 2021-10-02 ENCOUNTER — Encounter: Payer: Self-pay | Admitting: Pulmonary Disease

## 2021-10-02 ENCOUNTER — Encounter (HOSPITAL_BASED_OUTPATIENT_CLINIC_OR_DEPARTMENT_OTHER): Payer: Medicare Other | Admitting: General Surgery

## 2021-10-02 DIAGNOSIS — L97812 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d: Secondary | ICD-10-CM | POA: Diagnosis not present

## 2021-10-02 NOTE — Telephone Encounter (Signed)
Copy of yesterday's office visit has been faxed to [facility].

## 2021-10-02 NOTE — Progress Notes (Signed)
AMSI, GRIMLEY ([id number]) Visit Report for 10/02/2021 SuperBill Details Patient Name: Date of Service: [name], [name] 10/02/2021 Medical Record Number: [id number] Patient Account Number: [id number] Date of Birth/Sex: Treating RN: Dec 30, 1945 (76 y.o. 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

## 2021-10-02 NOTE — Progress Notes (Signed)
[name], [name] ([id number]) Visit Report for 10/02/2021 Arrival Information Details Patient Name: Date of Service: [name], [name] 10/02/2021 11:15 A M Medical Record Number: [id number] Patient Account Number: [id number] Date of Birth/Sex: Treating RN: 1945-06-16 (76 y.o. [name], [name] Primary Care Rhylan Gross: Domingo Mend Other Clinician: Referring Hadli Vandemark: Treating Tanna Loeffler/Extender: Gevena Cotton Weeks in Treatment: 1 Visit Information History Since Last Visit Added or deleted any medications: No Patient Arrived: Ambulatory Any new allergies or adverse reactions: No Arrival Time: 11:46 Had a fall or experienced change in No Accompanied By: spouse activities of daily living that may affect Transfer Assistance: None risk of falls: Patient Identification Verified: Yes Signs or symptoms of abuse/neglect since last visito No Secondary Verification Process Completed: Yes Hospitalized since last visit: No Patient Requires Transmission-Based Precautions: No Implantable device outside of the clinic excluding No Patient Has Alerts: No cellular tissue based products placed in the center since last visit: Has Dressing in Place as Prescribed: Yes Has Compression in Place as Prescribed: Yes Pain Present Now: No Electronic Signature(s) Signed: 10/02/2021 5:30:40 PM By: Baruch Gouty RN, BSN Entered By: Baruch Gouty on 10/02/2021 11:50:39 -------------------------------------------------------------------------------- Compression Therapy Details Patient Name: Date of Service: [name] 10/02/2021 11:15 A M Medical Record Number: [id number] Patient Account Number: [id number] Date of Birth/Sex: Treating RN: [birth year] (76 y.o. [name] Primary Care Alyson Ki: Domingo Mend Other Clinician: Referring Demiya Magno: Treating Dink Creps/Extender: Gevena Cotton Weeks in Treatment: 1 Compression Therapy Performed for  Wound Assessment: Wound #2 Right,Lateral Lower Leg Performed By: Clinician Baruch Gouty, RN Compression Type: Three Hydrologist) Signed: 10/02/2021 5:30:40 PM By: Baruch Gouty RN, BSN Entered By: Baruch Gouty on 10/02/2021 12:09:45 -------------------------------------------------------------------------------- Encounter Discharge Information Details Patient Name: Date of Service: [name] 10/02/2021 11:15 A M Medical Record Number: [id number] Patient Account Number: [id number] Date of Birth/Sex: Treating RN: [birth year] (76 y.o. [name] Primary Care Neela Zecca: Domingo Mend Other Clinician: Referring Jeramiah Mccaughey: Treating Abrial Arrighi/Extender: Caprice Kluver in Treatment: 1 Encounter Discharge Information Items Discharge Condition: Stable Ambulatory Status: Ambulatory Discharge Destination: Home Transportation: Private Auto Accompanied By: self Schedule Follow-up Appointment: Yes Clinical Summary of Care: Patient Declined Electronic Signature(s) Signed: 10/02/2021 5:30:40 PM By: Baruch Gouty RN, BSN Entered By: Baruch Gouty on 10/02/2021 12:10:34 -------------------------------------------------------------------------------- Patient/Caregiver Education Details Patient Name: Date of Service: [name] 9/26/2023andnbsp11:15 [place] Record Number: [id number] Patient Account Number: [id number] Date of Birth/Gender: Treating RN: 05-05-1945 (76 y.o. [name] Primary Care Physician: Domingo Mend Other Clinician: Referring Physician: Treating Physician/Extender: Caprice Kluver in Treatment: 1 Education Assessment Education Provided To: Patient Education Topics Provided Venous: Methods: Explain/Verbal Responses: Reinforcements needed, State content correctly Wound/Skin Impairment: Methods: Explain/Verbal Responses: Reinforcements needed, State  content correctly Electronic Signature(s) Signed: 10/02/2021 5:30:40 PM By: Baruch Gouty RN, BSN Entered By: Baruch Gouty on 10/02/2021 12:10:16 -------------------------------------------------------------------------------- Wound Assessment Details Patient Name: Date of Service: [name] 10/02/2021 11:15 A M Medical Record Number: [id number] Patient Account Number: [id number] Date of Birth/Sex: Treating RN: 1945-04-15 (76 y.o. [name] Primary Care Lacole Komorowski: Domingo Mend Other Clinician: Referring Chenita Ruda: Treating Dharma Pare/Extender: Gevena Cotton Weeks in Treatment: 1 Wound Status Wound Number: 2 Primary Open Surgical Wound Etiology: Wound Location: Right, Lateral Lower Leg Secondary Venous Leg Ulcer Wounding Event: Surgical Injury Etiology: Date Acquired: 08/17/2021 Wound Open Weeks Of Treatment:  1 Status: Clustered Wound: Yes Comorbid Cataracts, Chronic sinus problems/congestion, Hypertension, History: Crohns, Osteoarthritis Wound Measurements Length: (cm) 6.5 Width: (cm) 3 Depth: (cm) 0.1 Area: (cm) 15.315 Volume: (cm) 1.532 % Reduction in Area: 15.6% % Reduction in Volume: 15.5% Epithelialization: Small (1-33%) Tunneling: No Undermining: No Wound Description Classification: Full Thickness Without Exposed Support Structures Wound Margin: Distinct, outline attached Exudate Amount: Large Exudate Type: Purulent Exudate Color: yellow, brown, green Foul Odor After Cleansing: No Slough/Fibrino Yes Wound Bed Granulation Amount: Large (67-100%) Exposed Structure Granulation Quality: Red, Hyper-granulation Fascia Exposed: No Necrotic Amount: Small (1-33%) Fat Layer (Subcutaneous Tissue) Exposed: Yes Necrotic Quality: Adherent Slough Tendon Exposed: No Muscle Exposed: No Joint Exposed: No Bone Exposed: No Treatment Notes Wound #2 (Lower Leg) Wound Laterality: Right, Lateral Cleanser Peri-Wound Care Zinc  Oxide Ointment 30g tube Discharge Instruction: Apply Zinc Oxide to periwound with each dressing change Sween Lotion (Moisturizing lotion) Discharge Instruction: Apply moisturizing lotion as directed Topical Gentamicin Discharge Instruction: mix with santyl to wound bed Primary Dressing Santyl Ointment Discharge Instruction: Apply nickel thick amount to wound bed as instructed Secondary Dressing CarboFLEX Odor Control Dressing, 4x4 in Discharge Instruction: Apply over primary dressing as directed. Woven Gauze Sponge, Non-Sterile 4x4 in Discharge Instruction: Apply over primary dressing as directed. Secured With Compression Wrap ThreePress (3 layer compression wrap) Discharge Instruction: Apply three layer compression as directed. Compression Stockings Add-Ons Electronic Signature(s) Signed: 10/02/2021 5:30:40 PM By: Baruch Gouty RN, BSN Entered By: Baruch Gouty on 10/02/2021 12:11:15 -------------------------------------------------------------------------------- Vitals Details Patient Name: Date of Service: [name] 10/02/2021 11:15 A M Medical Record Number: [id number] Patient Account Number: [id number] Date of Birth/Sex: Treating RN: 12/03/1945 (76 y.o. [name] Primary Care Latrece Nitta: Domingo Mend Other Clinician: Referring Coraima Tibbs: Treating Diante Barley/Extender: Gevena Cotton Weeks in Treatment: 1 Vital Signs Time Taken: 11:50 Temperature (F): 98.4 Height (in): 60 Pulse (bpm): 77 Weight (lbs): 101 Respiratory Rate (breaths/min): 18 Body Mass Index (BMI): 19.7 Blood Pressure (mmHg): 159/78 Reference Range: 80 - 120 mg / dl Electronic Signature(s) Signed: 10/02/2021 5:30:40 PM By: Baruch Gouty RN, BSN Entered By: Baruch Gouty on 10/02/2021 11:51:18

## 2021-10-05 ENCOUNTER — Encounter (HOSPITAL_BASED_OUTPATIENT_CLINIC_OR_DEPARTMENT_OTHER): Payer: Medicare Other | Admitting: General Surgery

## 2021-10-05 DIAGNOSIS — L97812 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d: Secondary | ICD-10-CM | POA: Diagnosis not present

## 2021-10-05 NOTE — Progress Notes (Signed)
[name], WENTZ ([id number]) Visit Report for 10/05/2021 Arrival Information Details Patient Name: Date of Service: [name], BARAY 10/05/2021 10:30 A M Medical Record Number: [id number] Patient Account Number: [id number] Date of Birth/Sex: Treating RN: 04/27/1945 (76 y.o. [name], [name] Primary Care Joellyn Grandt: Domingo Mend Other Clinician: Referring Corinna Burkman: Treating Riely Baskett/Extender: Gevena Cotton Weeks in Treatment: 2 Visit Information History Since Last Visit Added or deleted any medications: No Patient Arrived: Ambulatory Any new allergies or adverse reactions: No Arrival Time: 10:24 Had a fall or experienced change in No Accompanied By: spouse activities of daily living that may affect Transfer Assistance: None risk of falls: Patient Identification Verified: Yes Signs or symptoms of abuse/neglect since last visito No Secondary Verification Process Completed: Yes Hospitalized since last visit: No Patient Requires Transmission-Based Precautions: No Implantable device outside of the clinic excluding No Patient Has Alerts: No cellular tissue based products placed in the center since last visit: Has Dressing in Place as Prescribed: Yes Has Compression in Place as Prescribed: Yes Pain Present Now: No Electronic Signature(s) Signed: 10/05/2021 5:12:46 PM By: Baruch Gouty RN, BSN Entered By: Baruch Gouty on 10/05/2021 10:25:10 -------------------------------------------------------------------------------- Compression Therapy Details Patient Name: Date of Service: April Austin I. 10/05/2021 10:30 A M Medical Record Number: [id number] Patient Account Number: [id number] Date of Birth/Sex: Treating RN: 25-Jan-1945 (76 y.o. [name] Primary Care Keithan Dileonardo: Domingo Mend Other Clinician: Referring Markiya Keefe: Treating Baltazar Pekala/Extender: Gevena Cotton Weeks in Treatment: 2 Compression Therapy Performed for  Wound Assessment: Wound #2 Right,Lateral Lower Leg Performed By: Clinician Baruch Gouty, RN Compression Type: Three Layer Post Procedure Diagnosis Same as Pre-procedure Electronic Signature(s) Signed: 10/05/2021 5:12:46 PM By: Baruch Gouty RN, BSN Entered By: Baruch Gouty on 10/05/2021 10:38:40 -------------------------------------------------------------------------------- Encounter Discharge Information Details Patient Name: Date of Service: April Austin I. 10/05/2021 10:30 A M Medical Record Number: [id number] Patient Account Number: [id number] Date of Birth/Sex: Treating RN: [birth year] (76 y.o. [name] Primary Care Willine Schwalbe: Domingo Mend Other Clinician: Referring Fendi Meinhardt: Treating Saadiya Wilfong/Extender: Caprice Kluver in Treatment: 2 Encounter Discharge Information Items Discharge Condition: Stable Ambulatory Status: Ambulatory Discharge Destination: Home Transportation: Private Auto Accompanied By: spouse Schedule Follow-up Appointment: Yes Clinical Summary of Care: Patient Declined Electronic Signature(s) Signed: 10/05/2021 5:12:46 PM By: Baruch Gouty RN, BSN Entered By: Baruch Gouty on 10/05/2021 11:00:39 -------------------------------------------------------------------------------- Lower Extremity Assessment Details Patient Name: Date of Service: April Austin I. 10/05/2021 10:30 A M Medical Record Number: [id number] Patient Account Number: [id number] Date of Birth/Sex: Treating RN: 08-Feb-1945 (76 y.o. [name] Primary Care Tully Mcinturff: Domingo Mend Other Clinician: Referring Kenyana Husak: Treating Deysy Schabel/Extender: Gevena Cotton Weeks in Treatment: 2 Edema Assessment Assessed: [Left: No] [Right: No] Edema: [Left: Ye] [Right: s] Calf Left: Right: Point of Measurement: From Medial Instep 30 cm Ankle Left: Right: Point of Measurement: From Medial Instep 19  cm Vascular Assessment Pulses: Dorsalis Pedis Palpable: [Right:Yes] Electronic Signature(s) Signed: 10/05/2021 5:12:46 PM By: Baruch Gouty RN, BSN Entered By: Baruch Gouty on 10/05/2021 10:29:25 -------------------------------------------------------------------------------- Multi Wound Chart Details Patient Name: Date of Service: April Austin I. 10/05/2021 10:30 A M Medical Record Number: [id number] Patient Account Number: [id number] Date of Birth/Sex: Treating RN: [birth year] (76 y.o. [name] Primary Care Lashan Gluth: Domingo Mend Other Clinician: Referring Lasheena Frieze: Treating Verble Styron/Extender: Gevena Cotton Weeks in Treatment: 2 Vital Signs Height(in): 60 Pulse(bpm): 92 Weight(lbs): 101 Blood Pressure(mmHg): 141/80  Body Mass Index(BMI): 19.7 Temperature(F): 98.2 Respiratory Rate(breaths/min): 18 Photos: [N/A:N/A] Right, Lateral Lower Leg N/A N/A Wound Location: Surgical Injury N/A N/A Wounding Event: Open Surgical Wound N/A N/A Primary Etiology: Venous Leg Ulcer N/A N/A Secondary Etiology: Cataracts, Chronic sinus N/A N/A Comorbid History: problems/congestion, Hypertension, Crohns, Osteoarthritis 08/17/2021 N/A N/A Date Acquired: 2 N/A N/A Weeks of Treatment: Open N/A N/A Wound Status: No N/A N/A Wound Recurrence: Yes N/A N/A Clustered Wound: 4.1x2.4x0.1 N/A N/A Measurements L x W x D (cm) 7.728 N/A N/A A (cm) : rea 0.773 N/A N/A Volume (cm) : 57.40% N/A N/A % Reduction in Area: 57.40% N/A N/A % Reduction in Volume: Full Thickness Without Exposed N/A N/A Classification: Support Structures Medium N/A N/A Exudate Amount: Serosanguineous N/A N/A Exudate Type: red, brown N/A N/A Exudate Color: Distinct, outline attached N/A N/A Wound Margin: Large (67-100%) N/A N/A Granulation Amount: Red, Hyper-granulation N/A N/A Granulation Quality: Small (1-33%) N/A N/A Necrotic Amount: Fat Layer  (Subcutaneous Tissue): Yes N/A N/A Exposed Structures: Fascia: No Tendon: No Muscle: No Joint: No Bone: No Small (1-33%) N/A N/A Epithelialization: Chemical Cauterization N/A N/A Procedures Performed: Compression Therapy Treatment Notes Wound #2 (Lower Leg) Wound Laterality: Right, Lateral Cleanser Peri-Wound Care Zinc Oxide Ointment 30g tube Discharge Instruction: Apply Zinc Oxide to periwound with each dressing change Sween Lotion (Moisturizing lotion) Discharge Instruction: Apply moisturizing lotion as directed Topical Primary Dressing Hydrofera Blue Ready Foam, 2.5 x2.5 in Discharge Instruction: Apply to wound bed as instructed Secondary Dressing CarboFLEX Odor Control Dressing, 4x4 in Discharge Instruction: Apply over primary dressing as directed. Woven Gauze Sponge, Non-Sterile 4x4 in Discharge Instruction: Apply over primary dressing as directed. Secured With Compression Wrap ThreePress (3 layer compression wrap) Discharge Instruction: Apply three layer compression as directed. Compression Stockings Add-Ons Electronic Signature(s) Signed: 10/05/2021 11:33:26 AM By: [name] MD FACS Signed: 10/05/2021 5:12:46 PM By: Baruch Gouty RN, BSN Entered By: [name] on 10/05/2021 11:33:26 -------------------------------------------------------------------------------- Multi-Disciplinary Care Plan Details Patient Name: Date of Service: April Austin I. 10/05/2021 10:30 A M Medical Record Number: [id number] Patient Account Number: [id number] Date of Birth/Sex: Treating RN: Jun 15, 1945 (76 y.o. [name] Primary Care Kambria Grima: Domingo Mend Other Clinician: Referring Merlina Marchena: Treating Julyan Gales/Extender: Caprice Kluver in Treatment: 2 Multidisciplinary Care Plan reviewed with physician Active Inactive Abuse / Safety / Falls / Self Care Management Nursing Diagnoses: History of Falls Goals: Patient/caregiver  will verbalize/demonstrate measures taken to prevent injury and/or falls Date Initiated: 09/20/2021 Target Resolution Date: 10/18/2021 Goal Status: Active Interventions: Assess fall risk on admission and as needed Assess impairment of mobility on admission and as needed per policy Notes: Wound/Skin Impairment Nursing Diagnoses: Impaired tissue integrity Knowledge deficit related to ulceration/compromised skin integrity Goals: Patient/caregiver will verbalize understanding of skin care regimen Date Initiated: 09/20/2021 Target Resolution Date: 10/18/2021 Goal Status: Active Ulcer/skin breakdown will have a volume reduction of 30% by week 4 Date Initiated: 09/20/2021 Target Resolution Date: 10/18/2021 Goal Status: Active Interventions: Assess patient/caregiver ability to obtain necessary supplies Assess patient/caregiver ability to perform ulcer/skin care regimen upon admission and as needed Assess ulceration(s) every visit Provide education on ulcer and skin care Treatment Activities: Skin care regimen initiated : 09/20/2021 Topical wound management initiated : 09/20/2021 Notes: Electronic Signature(s) Signed: 10/05/2021 5:12:46 PM By: Baruch Gouty RN, BSN Entered By: Baruch Gouty on 10/05/2021 10:37:57 -------------------------------------------------------------------------------- Pain Assessment Details Patient Name: Date of Service: April Austin I. 10/05/2021 10:30 A M Medical Record Number: [id number] Patient Account Number:  [id number] Date of Birth/Sex: Treating RN: 1945-03-02 (76 y.o. [name] Primary Care Zakariyya Helfman: Domingo Mend Other Clinician: Referring Lailyn Appelbaum: Treating Maudy Yonan/Extender: Gevena Cotton Weeks in Treatment: 2 Active Problems Location of Pain Severity and Description of Pain Patient Has Paino No Site Locations Rate the pain. Current Pain Level: 0 Pain Management and Medication Current Pain  Management: Electronic Signature(s) Signed: 10/05/2021 5:12:46 PM By: Baruch Gouty RN, BSN Entered By: Baruch Gouty on 10/05/2021 10:26:47 -------------------------------------------------------------------------------- Patient/Caregiver Education Details Patient Name: Date of Service: April Austin I. 9/29/2023andnbsp10:30 A M Medical Record Number: [id number] Patient Account Number: [id number] Date of Birth/Gender: Treating RN: [birth year] (76 y.o. [name] Primary Care Physician: Domingo Mend Other Clinician: Referring Physician: Treating Physician/Extender: Caprice Kluver in Treatment: 2 Education Assessment Education Provided To: Patient Education Topics Provided Venous: Methods: Explain/Verbal Responses: Reinforcements needed, State content correctly Wound/Skin Impairment: Methods: Explain/Verbal Responses: Reinforcements needed, State content correctly Electronic Signature(s) Signed: 10/05/2021 5:12:46 PM By: Baruch Gouty RN, BSN Entered By: Baruch Gouty on 10/05/2021 10:38:20 -------------------------------------------------------------------------------- Wound Assessment Details Patient Name: Date of Service: April Austin I. 10/05/2021 10:30 A M Medical Record Number: [id number] Patient Account Number: [id number] Date of Birth/Sex: Treating RN: 03/05/1945 (76 y.o. [name], [name] Primary Care Kaityln Kallstrom: Domingo Mend Other Clinician: Referring Jumar Greenstreet: Treating Felipe Cabell/Extender: Gevena Cotton Weeks in Treatment: 2 Wound Status Wound Number: 2 Primary Open Surgical Wound Etiology: Wound Location: Right, Lateral Lower Leg Secondary Venous Leg Ulcer Wounding Event: Surgical Injury Etiology: Date Acquired: 08/17/2021 Wound Status: Open Weeks Of Treatment: 2 Comorbid Cataracts, Chronic sinus problems/congestion, Hypertension, Clustered Wound: Yes History: Crohns,  Osteoarthritis Photos Wound Measurements Length: (cm) 4.1 Width: (cm) 2.4 Depth: (cm) 0.1 Area: (cm) 7.728 Volume: (cm) 0.773 % Reduction in Area: 57.4% % Reduction in Volume: 57.4% Epithelialization: Small (1-33%) Tunneling: No Undermining: No Wound Description Classification: Full Thickness Without Exposed Support Structures Wound Margin: Distinct, outline attached Exudate Amount: Medium Exudate Type: Serosanguineous Exudate Color: red, brown Foul Odor After Cleansing: No Slough/Fibrino Yes Wound Bed Granulation Amount: Large (67-100%) Exposed Structure Granulation Quality: Red, Hyper-granulation Fascia Exposed: No Necrotic Amount: Small (1-33%) Fat Layer (Subcutaneous Tissue) Exposed: Yes Necrotic Quality: Adherent Slough Tendon Exposed: No Muscle Exposed: No Joint Exposed: No Bone Exposed: No Treatment Notes Wound #2 (Lower Leg) Wound Laterality: Right, Lateral Cleanser Peri-Wound Care Zinc Oxide Ointment 30g tube Discharge Instruction: Apply Zinc Oxide to periwound with each dressing change Sween Lotion (Moisturizing lotion) Discharge Instruction: Apply moisturizing lotion as directed Topical Primary Dressing Hydrofera Blue Ready Foam, 2.5 x2.5 in Discharge Instruction: Apply to wound bed as instructed Secondary Dressing CarboFLEX Odor Control Dressing, 4x4 in Discharge Instruction: Apply over primary dressing as directed. Woven Gauze Sponge, Non-Sterile 4x4 in Discharge Instruction: Apply over primary dressing as directed. Secured With Compression Wrap ThreePress (3 layer compression wrap) Discharge Instruction: Apply three layer compression as directed. Compression Stockings Add-Ons Electronic Signature(s) Signed: 10/05/2021 5:12:46 PM By: Baruch Gouty RN, BSN Entered By: Baruch Gouty on 10/05/2021 10:33:58 -------------------------------------------------------------------------------- Vitals Details Patient Name: Date of Service: April Austin I. 10/05/2021 10:30 A M Medical Record Number: [id number] Patient Account Number: [id number] Date of Birth/Sex: Treating RN: 02/22/1945 (76 y.o. [name] Primary Care Candyce Gambino: Domingo Mend Other Clinician: Referring Freida Nebel: Treating Mattelyn Imhoff/Extender: Gevena Cotton Weeks in Treatment: 2 Vital Signs Time Taken: 10:26 Temperature (F): 98.2 Height (in): 60 Pulse (bpm): 92 Weight (lbs): 101 Respiratory  Rate (breaths/min): 18 Body Mass Index (BMI): 19.7 Blood Pressure (mmHg): 141/80 Reference Range: 80 - 120 mg / dl Electronic Signature(s) Signed: 10/05/2021 5:12:46 PM By: Baruch Gouty RN, BSN Entered By: Baruch Gouty on 10/05/2021 10:26:35

## 2021-10-05 NOTE — Progress Notes (Signed)
[name], [name] ([id number]) Visit Report for 10/05/2021 Chief Complaint Document Details Patient Name: Date of Service: [name], [name] 10/05/2021 10:30 A M Medical Record Number: [id number] Patient Account Number: [id number] Date of Birth/Sex: Treating RN: 03-Apr-1945 (76 y.o. [name] Primary Care Provider: Domingo Mend Other Clinician: Referring Provider: Treating Provider/Extender: Caprice Kluver in Treatment: 2 Information Obtained from: Patient Chief Complaint 11/15/2020; patient is here for review of a wound area on the right posterior calf 09/20/2021: Patient is here for a nonhealing wound after undergoing Mohs surgery on her right lower leg. Electronic Signature(s) Signed: 10/05/2021 11:33:31 AM By: [name] MD FACS Entered By: [name] on 10/05/2021 11:33:31 -------------------------------------------------------------------------------- HPI Details Patient Name: Date of Service: April Austin I. 10/05/2021 10:30 A M Medical Record Number: [id number] Patient Account Number: [id number] Date of Birth/Sex: Treating RN: [birth year] (76 y.o. [name] Primary Care Provider: Domingo Mend Other Clinician: Referring Provider: Treating Provider/Extender: Gevena Cotton Weeks in Treatment: 2 History of Present Illness HPI Description: ADMISSION 11/15/2020 This is a 76 year old woman with a history of squamous cell cancer skin cancer followed by Dr. Anabel Bene dermatology. She describes having a wound on the right posterior calf dating back since September. She saw her primary doctor in early October who suggested petroleum infused gauze. The patient has been doing this. No real improvement in fact she says this is getting larger. She does not complain much of pain. She is completely uncertain about how this happened there was no trauma she simply became aware of it at some point describing it  is about the size of a quarter but it has been getting progressively larger since then. She is here for review of this. Past medical history Crohn's disease not currently on prednisone, gastroesophageal reflux disease, history of squamous cell skin cancer, history of rheumatoid arthritis, hypertension 11/16; this lady had a large superficial atypical wound on the right posterior calf. She has significant solar skin damage probably some degree of venous insufficiency but she described a rapidly expanding wound area. Because of this and a history of skin cancer I went ahead and did a shave biopsy but we do not have the results of this at the time we saw her in clinic today. She has been using silver alginate and kerlix 11/30; shave biopsy I did last time did not show any malignancy or fungal elements. Suggestion of a stasis ulcer. With our treatment which is simply been silver alginate with kerlix that she is changing daily the wound is down dramatically almost surprising how rapidly it is reduced in surface area 12/14; patient comes in today with her wound healed this was on the right posterior calf. She has significant solar skin damage probably some degree of venous insufficiency however she described a rapidly expanding wound area on admission. Because of this and a history of skin cancer I did do a shave biopsy but nothing was really found of worry. 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 Unfortunately, the wound has failed to heal. There is thick slough on the wound surface. The periwound skin is in reasonably good condition. There is granulation tissue forming underneath the slough. 09/28/2021: The wound has deteriorated  rather significantly  over the past week. She had fairly copious drainage that she noticed as early as Monday, but she did not contact our office. Today there is a lot of periwound skin breakdown and a very pungent odor coming from the wound. There is thick slough on the wound surface but there is still good granulation tissue underlying this. Despite this, however, the wound actually measures smaller. 10/05/2021: The culture that I took grew out MRSA. I prescribed doxycycline and she is still taking this. There has been a tremendous improvement in the wound. It is smaller and has contracted considerably. There is hypertrophic granulation tissue present. No malodorous drainage. Electronic Signature(s) Signed: 10/05/2021 12:01:50 PM By: [name] MD FACS Entered By: [name] on 10/05/2021 12:01:50 -------------------------------------------------------------------------------- Chemical Cauterization Details Patient Name: Date of Service: April Austin I. 10/05/2021 10:30 A M Medical Record Number: [id number] Patient Account Number: [id number] Date of Birth/Sex: Treating RN: February 23, 1945 (75 y.o. [name] Primary Care Provider: Domingo Mend Other Clinician: Referring Provider: Treating Provider/Extender: Gevena Cotton Weeks in Treatment: 2 Procedure Performed for: Wound #2 Right,Lateral Lower Leg Performed By: Physician [name], MD Post Procedure Diagnosis Same as Pre-procedure Notes using silver nitrate sticks Electronic Signature(s) Signed: 10/05/2021 12:16:21 PM By: [name] MD FACS Signed: 10/05/2021 5:12:46 PM By: Baruch Gouty RN, BSN Entered By: Baruch Gouty on 10/05/2021 10:42:37 -------------------------------------------------------------------------------- Physical Exam Details Patient Name: Date of Service: April Austin I. 10/05/2021 10:30 A M Medical Record Number: [id number] Patient Account Number: [id number] Date of  Birth/Sex: Treating RN: 01-06-1946 (76 y.o. [name] Primary Care Provider: Domingo Mend Other Clinician: Referring Provider: Treating Provider/Extender: Gevena Cotton Weeks in Treatment: 2 Constitutional Slightly hypertensive. . . . No acute distress.Marland Kitchen Respiratory Normal work of breathing on room air.. Notes 10/05/2021: There has been a tremendous improvement in the wound. It is smaller and has contracted considerably. There is hypertrophic granulation tissue present. No malodorous drainage. Electronic Signature(s) Signed: 10/05/2021 12:02:56 PM By: [name] MD FACS Entered By: [name] on 10/05/2021 12:02:56 -------------------------------------------------------------------------------- Physician Orders Details Patient Name: Date of Service: April Austin I. 10/05/2021 10:30 A M Medical Record Number: [id number] Patient Account Number: [id number] Date of Birth/Sex: Treating RN: [birth year] (76 y.o. [name], [name] Primary Care Provider: Domingo Mend Other Clinician: Referring Provider: Treating Provider/Extender: Gevena Cotton Weeks in Treatment: 2 Verbal / Phone Orders: No Diagnosis Coding ICD-10 Coding Code Description [phone] 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d T81.31XS Disruption of external operation (surgical) wound, not elsewhere classified, sequela I10 Essential (primary) hypertension C44.722 Squamous cell carcinoma of skin of right lower limb, including hip K50.90 Crohn's disease, unspecified, without complications P61 Unspecified severe protein-calorie malnutrition Follow-up Appointments ppointment in 1 week. - Dr. [name] RM 1 with Vaughan Basta Return A Bathing/ Shower/ Hygiene May shower with protection but do not get wound dressing(s) wet. Edema Control - Lymphedema / SCD / Other Elevate legs to the level of the heart or above for 30  minutes daily and/or when sitting, a frequency of: Avoid standing for long periods of time. Exercise regularly Wound Treatment Wound #2 - Lower Leg Wound Laterality: Right, Lateral Peri-Wound Care: Zinc Oxide Ointment 30g tube 1 x Per Week/30 Days Discharge Instructions: Apply Zinc Oxide to periwound with each dressing change Peri-Wound Care: Sween Lotion (Moisturizing lotion) 1 x Per Week/30 Days Discharge Instructions: Apply moisturizing lotion  as directed Prim Dressing: Hydrofera Blue Ready Foam, 2.5 x2.5 in 1 x Per Week/30 Days ary Discharge Instructions: Apply to wound bed as instructed Secondary Dressing: CarboFLEX Odor Control Dressing, 4x4 in 1 x Per Week/30 Days Discharge Instructions: Apply over primary dressing as directed. Secondary Dressing: Woven Gauze Sponge, Non-Sterile 4x4 in 1 x Per Week/30 Days Discharge Instructions: Apply over primary dressing as directed. Compression Wrap: ThreePress (3 layer compression wrap) 1 x Per Week/30 Days Discharge Instructions: Apply three layer compression as directed. Electronic Signature(s) Signed: 10/05/2021 12:16:21 PM By: [name] MD FACS Entered By: [name] on 10/05/2021 12:03:48 -------------------------------------------------------------------------------- Problem List Details Patient Name: Date of Service: April Austin I. 10/05/2021 10:30 A M Medical Record Number: [id number] Patient Account Number: [id number] Date of Birth/Sex: Treating RN: 26-Apr-1945 (76 y.o. [name] Primary Care Provider: Domingo Mend Other Clinician: Referring Provider: Treating Provider/Extender: Gevena Cotton Weeks in Treatment: 2 Active Problems ICD-10 Encounter Code Description Active Date MDM Diagnosis L97.812 Non-pressure chronic ulcer of other part of right lower leg with fat layer 09/20/2021 No Yes exposed T81.31XS Disruption of external operation (surgical) wound, not elsewhere  classified, 09/20/2021 No Yes sequela I10 Essential (primary) hypertension 09/20/2021 No Yes C44.722 Squamous cell carcinoma of skin of right lower limb, including hip 09/20/2021 No Yes K50.90 Crohn's disease, unspecified, without complications 05/03/621 No Yes E43 Unspecified severe protein-calorie malnutrition 09/20/2021 No Yes Inactive Problems Resolved Problems Electronic Signature(s) Signed: 10/05/2021 11:33:21 AM By: [name] MD FACS Entered By: [name] on 10/05/2021 11:33:21 -------------------------------------------------------------------------------- Progress Note Details Patient Name: Date of Service: April Austin I. 10/05/2021 10:30 A M Medical Record Number: [id number] Patient Account Number: [id number] Date of Birth/Sex: Treating RN: [birth year] (77 y.o. [name] Primary Care Provider: Domingo Mend Other Clinician: Referring Provider: Treating Provider/Extender: Caprice Kluver in Treatment: 2 Subjective Chief Complaint Information obtained from Patient 11/15/2020; patient is here for review of a wound area on the right posterior calf 09/20/2021: Patient is here for a nonhealing wound after undergoing Mohs surgery on her right lower leg. History of Present Illness (HPI) ADMISSION 11/15/2020 This is a 76 year old woman with a history of squamous cell cancer skin cancer followed by Dr. Anabel Bene dermatology. She describes having a wound on the right posterior calf dating back since September. She saw her primary doctor in early October who suggested petroleum infused gauze. The patient has been doing this. No real improvement in fact she says this is getting larger. She does not complain much of pain. She is completely uncertain about how this happened there was no trauma she simply became aware of it at some point describing it is about the size of a quarter but it has been getting progressively larger since then. She  is here for review of this. Past medical history Crohn's disease not currently on prednisone, gastroesophageal reflux disease, history of squamous cell skin cancer, history of rheumatoid arthritis, hypertension 11/16; this lady had a large superficial atypical wound on the right posterior calf. She has significant solar skin damage probably some degree of venous insufficiency but she described a rapidly expanding wound area. Because of this and a history of skin cancer I went ahead and did a shave biopsy but we do not have the results of this at the time we saw her in clinic today. She has been using silver alginate and kerlix 11/30; shave biopsy I did last time did not show any malignancy  or fungal elements. Suggestion of a stasis ulcer. With our treatment which is simply been silver alginate with kerlix that she is changing daily the wound is down dramatically almost surprising how rapidly it is reduced in surface area 12/14; patient comes in today with her wound healed this was on the right posterior calf. She has significant solar skin damage probably some degree of venous insufficiency however she described a rapidly expanding wound area on admission. Because of this and a history of skin cancer I did do a shave biopsy but nothing was really found of worry. 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 Unfortunately, the wound has failed to heal. There is thick slough on the wound surface. The periwound skin is in reasonably good condition. There is granulation tissue forming underneath the slough. 09/28/2021: The wound has deteriorated rather significantly over the past week. She had fairly copious drainage that she noticed as early as Monday, but  she did not contact our office. Today there is a lot of periwound skin breakdown and a very pungent odor coming from the wound. There is thick slough on the wound surface but there is still good granulation tissue underlying this. Despite this, however, the wound actually measures smaller. 10/05/2021: The culture that I took grew out MRSA. I prescribed doxycycline and she is still taking this. There has been a tremendous improvement in the wound. It is smaller and has contracted considerably. There is hypertrophic granulation tissue present. No malodorous drainage. Patient History Information obtained from Patient. Family History Diabetes - Father, Heart Disease - Father, Hypertension - Father, No family history of Cancer, Hereditary Spherocytosis, Kidney Disease, Lung Disease, Seizures, Stroke, Thyroid Problems, Tuberculosis. Social History Former smoker - quit 30 years ago, Marital Status - Married, Alcohol Use - Never, Drug Use - No History, Caffeine Use - Rarely. Medical History Eyes Patient has history of Cataracts - Had cataract surgery Denies history of Glaucoma, Optic Neuritis Ear/Nose/Mouth/Throat Patient has history of Chronic sinus problems/congestion - Post Nasal drip Denies history of Middle ear problems Hematologic/Lymphatic Denies history of Anemia, Hemophilia, Human Immunodeficiency Virus, Lymphedema, Sickle Cell Disease Respiratory Denies history of Aspiration, Asthma, Chronic Obstructive Pulmonary Disease (COPD), Pneumothorax, Sleep Apnea, Tuberculosis Cardiovascular Patient has history of Hypertension Gastrointestinal Patient has history of Crohnoos - Has Chrons Disease Denies history of Cirrhosis , Colitis, Hepatitis A, Hepatitis B, Hepatitis C Endocrine Denies history of Type I Diabetes, Type II Diabetes Genitourinary Denies history of End Stage Renal Disease Immunological Denies history of Lupus Erythematosus, Raynaudoos, Scleroderma Musculoskeletal Patient  has history of Osteoarthritis - Knees Denies history of Gout, Rheumatoid Arthritis, Osteomyelitis Neurologic Denies history of Dementia, Neuropathy, Quadriplegia, Paraplegia, Seizure Disorder Oncologic Denies history of Received Chemotherapy, Received Radiation Psychiatric Denies history of Anorexia/bulimia, Confinement Anxiety Hospitalization/Surgery History - bil TKA. - Moh's procedure right leg 08/17/21. - bil cataract extractions. - nasal fx surgery. Medical A Surgical History Notes nd Gastrointestinal Has a large hiatal hernia. Has Chrons Disease., GERD, pancreatic insufficiency Endocrine Pancreatic Insufficiency Genitourinary stage 3 Chronic Kidney disease Integumentary (Skin) Had Squamous Cell (skin cancer). Had a skin graft on Right knee (area) Musculoskeletal Had Bil knee replacements, hx cervical spine fx, sternal fx, thoracis spine fx Neurologic left foot drop Oncologic Had Squamous Cell (skin cell)  Objective Constitutional Slightly hypertensive. No acute distress.. Vitals Time Taken: 10:26 AM, Height: 60 in, Weight: 101 lbs, BMI: 19.7, Temperature: 98.2 F, Pulse: 92 bpm, Respiratory Rate: 18 breaths/min, Blood Pressure: 141/80 mmHg. Respiratory Normal work of breathing on room air.. General Notes: 10/05/2021: There has been a tremendous improvement in the wound. It is smaller and has contracted considerably. There is hypertrophic granulation tissue present. No malodorous drainage. Integumentary (Hair, Skin) Wound #2 status is Open. Original cause of wound was Surgical Injury. The date acquired was: 08/17/2021. The wound has been in treatment 2 weeks. The wound is located on the Right,Lateral Lower Leg. The wound measures 4.1cm length x 2.4cm width x 0.1cm depth; 7.728cm^2 area and 0.773cm^3 volume. There is Fat Layer (Subcutaneous Tissue) exposed. There is no tunneling or undermining noted. There is a medium amount of serosanguineous drainage noted. The wound margin  is distinct with the outline attached to the wound base. There is large (67-100%) red, hyper - granulation within the wound bed. There is a small (1-33%) amount of necrotic tissue within the wound bed including Adherent Slough. Assessment Active Problems ICD-10 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d Disruption of external operation (surgical) wound, not elsewhere classified, sequela Essential (primary) hypertension Squamous cell carcinoma of skin of right lower limb, including hip Crohn's disease, unspecified, without complications Unspecified severe protein-calorie malnutrition Procedures Wound #2 Pre-procedure diagnosis of Wound #2 is an Open Surgical Wound located on the Right,Lateral Lower Leg . There was a Three Layer Compression Therapy Procedure by Baruch Gouty, RN. Post procedure Diagnosis Wound #2: Same as Pre-Procedure Pre-procedure diagnosis of Wound #2 is an Open Surgical Wound located on the Right,Lateral Lower Leg . An Chemical Cauterization procedure was performed by [name], MD. Post procedure Diagnosis Wound #2: Same as Pre-Procedure Notes: using silver nitrate sticks Plan Follow-up Appointments: Return Appointment in 1 week. - Dr. [name] RM 1 with Royce Macadamia Shower/ Hygiene: May shower with protection but do not get wound dressing(s) wet. Edema Control - Lymphedema / SCD / Other: Elevate legs to the level of the heart or above for 30 minutes daily and/or when sitting, a frequency of: Avoid standing for long periods of time. Exercise regularly WOUND #2: - Lower Leg Wound Laterality: Right, Lateral Peri-Wound Care: Zinc Oxide Ointment 30g tube 1 x Per Week/30 Days Discharge Instructions: Apply Zinc Oxide to periwound with each dressing change Peri-Wound Care: Sween Lotion (Moisturizing lotion) 1 x Per Week/30 Days Discharge Instructions: Apply moisturizing lotion as directed Prim Dressing: Hydrofera Blue  Ready Foam, 2.5 x2.5 in 1 x Per Week/30 Days ary Discharge Instructions: Apply to wound bed as instructed Secondary Dressing: CarboFLEX Odor Control Dressing, 4x4 in 1 x Per Week/30 Days Discharge Instructions: Apply over primary dressing as directed. Secondary Dressing: Woven Gauze Sponge, Non-Sterile 4x4 in 1 x Per Week/30 Days Discharge Instructions: Apply over primary dressing as directed. Com pression Wrap: ThreePress (3 layer compression wrap) 1 x Per Week/30 Days Discharge Instructions: Apply three layer compression as directed. 10/05/2021: There has been a tremendous improvement in the wound. It is smaller and has contracted considerably. There is hypertrophic granulation tissue present. No malodorous drainage. No debridement was necessary today. I did chemically cauterize the hypertrophic granulation tissue with silver nitrate. We are going to change her dressing to [facility] with 3 layer compression. She will complete her course of oral doxycycline. Follow-up in 1 week. Electronic Signature(s) Signed: 10/05/2021 12:04:27 PM By: [name] MD  FACS Entered By: [name] on 10/05/2021 12:04:27 -------------------------------------------------------------------------------- HxROS Details Patient Name: Date of Service: [name], [name]. 10/05/2021 10:30 A M Medical Record Number: [id number] Patient Account Number: [id number] Date of Birth/Sex: Treating RN: 1945/10/23 (76 y.o. [name] Primary Care Provider: Domingo Mend Other Clinician: Referring Provider: Treating Provider/Extender: Gevena Cotton Weeks in Treatment: 2 Information Obtained From Patient Eyes Medical History: Positive for: Cataracts - Had cataract surgery Negative for: Glaucoma; Optic Neuritis Ear/Nose/Mouth/Throat Medical History: Positive for: Chronic sinus problems/congestion - Post Nasal drip Negative for: Middle ear  problems Hematologic/Lymphatic Medical History: Negative for: Anemia; Hemophilia; Human Immunodeficiency Virus; Lymphedema; Sickle Cell Disease Respiratory Medical History: Negative for: Aspiration; Asthma; Chronic Obstructive Pulmonary Disease (COPD); Pneumothorax; Sleep Apnea; Tuberculosis Cardiovascular Medical History: Positive for: Hypertension Gastrointestinal Medical History: Positive for: Crohns - Has Chrons Disease Negative for: Cirrhosis ; Colitis; Hepatitis A; Hepatitis B; Hepatitis C Past Medical History Notes: Has a large hiatal hernia. Has Chrons Disease., GERD, pancreatic insufficiency Endocrine Medical History: Negative for: Type I Diabetes; Type II Diabetes Past Medical History Notes: Pancreatic Insufficiency Genitourinary Medical History: Negative for: End Stage Renal Disease Past Medical History Notes: stage 3 Chronic Kidney disease Immunological Medical History: Negative for: Lupus Erythematosus; Raynauds; Scleroderma Integumentary (Skin) Medical History: Past Medical History Notes: Had Squamous Cell (skin cancer). Had a skin graft on Right knee (area) Musculoskeletal Medical History: Positive for: Osteoarthritis - Knees Negative for: Gout; Rheumatoid Arthritis; Osteomyelitis Past Medical History Notes: Had Bil knee replacements, hx cervical spine fx, sternal fx, thoracis spine fx Neurologic Medical History: Negative for: Dementia; Neuropathy; Quadriplegia; Paraplegia; Seizure Disorder Past Medical History Notes: left foot drop Oncologic Medical History: Negative for: Received Chemotherapy; Received Radiation Past Medical History Notes: Had Squamous Cell (skin cell) Psychiatric Medical History: Negative for: Anorexia/bulimia; Confinement Anxiety HBO Extended History Items Ear/Nose/Mouth/Throat: Eyes: Chronic sinus Cataracts problems/congestion Immunizations Pneumococcal Vaccine: Received Pneumococcal Vaccination: Yes Received  Pneumococcal Vaccination On or After 60th Birthday: Yes Tetanus Vaccine: Last tetanus shot: 11/16/2011 Implantable Devices None Hospitalization / Surgery History Type of Hospitalization/Surgery bil TKA Moh's procedure right leg 08/17/21 bil cataract extractions nasal fx surgery Family and Social History Cancer: No; Diabetes: Yes - Father; Heart Disease: Yes - Father; Hereditary Spherocytosis: No; Hypertension: Yes - Father; Kidney Disease: No; Lung Disease: No; Seizures: No; Stroke: No; Thyroid Problems: No; Tuberculosis: No; Former smoker - quit 30 years ago; Marital Status - Married; Alcohol Use: Never; Drug Use: No History; Caffeine Use: Rarely; Financial Concerns: No; Food, Clothing or Shelter Needs: No; Support System Lacking: No; Transportation Concerns: No Engineer, maintenance) Signed: 10/05/2021 12:16:21 PM By: [name] MD FACS Signed: 10/05/2021 5:12:46 PM By: Baruch Gouty RN, BSN Entered By: [name] on 10/05/2021 12:02:06 -------------------------------------------------------------------------------- Santiago Details Patient Name: Date of Service: April Austin I. 10/05/2021 Medical Record Number: [id number] Patient Account Number: [id number] Date of Birth/Sex: Treating RN: 1945-06-18 (76 y.o. 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, including hip K50.90 Crohn's disease, unspecified, without complications Y10 Unspecified severe protein-calorie malnutrition Facility  Procedures CPT4 Code: [id number] Description: 52778 - CHEM CAUT GRANULATION TISS ICD-10 Diagnosis Description  D78.242 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d Modifier: Quantity: 1 CPT4 Code: [id number] Description: (Facility Use Only) [id number] - APPLY MULTLAY COMPRS LWR RT LEG Modifier: 59 Quantity: 1 Physician Procedures : CPT4 Code Description Modifier [id number] - WC PHYS LEVEL 4 - EST PT ICD-10 Diagnosis Description T26.712 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d T81.31XS Disruption of external operation (surgical) wound,  not elsewhere classified, sequela 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

## 2021-10-08 NOTE — Telephone Encounter (Signed)
OV notes and clearance form have been faxed back to [facility]. Nothing further needed at this time.

## 2021-10-16 ENCOUNTER — Encounter (HOSPITAL_BASED_OUTPATIENT_CLINIC_OR_DEPARTMENT_OTHER): Payer: Medicare Other | Attending: General Surgery | Admitting: General Surgery

## 2021-10-16 DIAGNOSIS — K219 Gastro-esophageal reflux disease without esophagitis: Secondary | ICD-10-CM | POA: Insufficient documentation

## 2021-10-16 DIAGNOSIS — E43 Unspecified severe protein-calorie malnutrition: Secondary | ICD-10-CM | POA: Insufficient documentation

## 2021-10-16 DIAGNOSIS — I10 Essential (primary) hypertension: Secondary | ICD-10-CM | POA: Insufficient documentation

## 2021-10-16 DIAGNOSIS — Z96653 Presence of artificial knee joint, bilateral: Secondary | ICD-10-CM | POA: Insufficient documentation

## 2021-10-16 DIAGNOSIS — I872 Venous insufficiency (chronic) (peripheral): Secondary | ICD-10-CM | POA: Insufficient documentation

## 2021-10-16 DIAGNOSIS — T8131XS Disruption of external operation (surgical) wound, not elsewhere classified, sequela: Secondary | ICD-10-CM | POA: Insufficient documentation

## 2021-10-16 DIAGNOSIS — K5090 Crohn's disease, unspecified, without complications: Secondary | ICD-10-CM | POA: Insufficient documentation

## 2021-10-16 DIAGNOSIS — Z681 Body mass index (BMI) 19 or less, adult: Secondary | ICD-10-CM | POA: Insufficient documentation

## 2021-10-16 DIAGNOSIS — X58XXXS Exposure to other specified factors, sequela: Secondary | ICD-10-CM | POA: Insufficient documentation

## 2021-10-16 DIAGNOSIS — Z87891 Personal history of nicotine dependence: Secondary | ICD-10-CM | POA: Insufficient documentation

## 2021-10-16 DIAGNOSIS — C44722 Squamous cell carcinoma of skin of right lower limb, including hip: Secondary | ICD-10-CM | POA: Insufficient documentation

## 2021-10-16 DIAGNOSIS — L97812 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d: Secondary | ICD-10-CM | POA: Insufficient documentation

## 2021-10-16 DIAGNOSIS — Z85828 Personal history of other malignant neoplasm of skin: Secondary | ICD-10-CM | POA: Insufficient documentation

## 2021-10-16 NOTE — Progress Notes (Signed)
[name], [name] ([id number]) [id number] Page 1 of 10 Visit Report for 10/16/2021 Chief Complaint Document Details Patient Name: Date of Service: [name], [name] 10/16/2021 10:30 A M Medical Record Number: [id number] Patient Account Number: [id number] Date of Birth/Sex: Treating RN: [birth year] (76 y.o. F) Primary Care Provider: Domingo Mend Other Clinician: Referring Provider: Treating Provider/Extender: Caprice Kluver in Treatment: 3 Information Obtained from: Patient Chief Complaint 11/15/2020; patient is here for review of a wound area on the right posterior calf 09/20/2021: Patient is here for a nonhealing wound after undergoing Mohs surgery on her right lower leg. Electronic Signature(s) Signed: 10/16/2021 10:58:45 AM By: [name] MD FACS Entered By: [name] on 10/16/2021 10:58:44 -------------------------------------------------------------------------------- HPI Details Patient Name: Date of Service: [name]. 10/16/2021 10:30 A M Medical Record Number: [id number] Patient Account Number: [id number] Date of Birth/Sex: Treating RN: 14-Apr-1945 (76 y.o. F) Primary Care Provider: Domingo Mend Other Clinician: Referring Provider: Treating Provider/Extender: Gevena Cotton Weeks in Treatment: 3 History of Present Illness HPI Description: ADMISSION 11/15/2020 This is a 76 year old woman with a history of squamous cell cancer skin cancer followed by Dr. Anabel Bene dermatology. She describes having a wound on the right posterior calf dating back since September. She saw her primary doctor in early October who suggested petroleum infused gauze. The patient has been doing this. No real improvement in fact she says this is getting larger. She does not complain much of pain. She is completely uncertain about how this happened there was no trauma she simply became aware of it at  some point describing it is about the size of a quarter but it has been getting progressively larger since then. She is here for review of this. Past medical history Crohn's disease not currently on prednisone, gastroesophageal reflux disease, history of squamous cell skin cancer, history of rheumatoid arthritis, hypertension 11/16; this lady had a large superficial atypical wound on the right posterior calf. She has significant solar skin damage probably some degree of venous insufficiency but she described a rapidly expanding wound area. Because of this and a history of skin cancer [name] went ahead and did a shave biopsy but we do not have the results of this at the time we saw her in clinic today. She has been using silver alginate and kerlix 11/30; shave biopsy [name] did last time did not show any malignancy or fungal elements. Suggestion of a stasis ulcer. With our treatment which is simply been silver alginate with kerlix that she is changing daily the wound is down dramatically almost surprising how rapidly it is reduced in surface area 12/14; patient comes in today with her wound healed this was on the right posterior calf. She has significant solar skin damage probably some degree of venous insufficiency however she described a rapidly expanding wound area on admission. Because of this and a history of skin cancer [name] did do a shave biopsy but nothing was really found of worry. 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 Unfortunately, the wound has failed to heal. There is thick slough on the wound surface. The periwound skin is in reasonably good condition. There is granulation tissue forming underneath the slough. 09/28/2021: The wound has  deteriorated rather significantly over the past week. She had fairly copious drainage that she noticed as early as Monday, but she RAZAN, SILER [name] ([id number]) [id number] Page 2 of 10 did not contact our office. Today there is a lot of periwound skin breakdown and a very pungent odor coming from the wound. There is thick slough on the wound surface but there is still good granulation tissue underlying this. Despite this, however, the wound actually measures smaller. 10/05/2021: The culture that [name] took grew out MRSA. [name] prescribed doxycycline and she is still taking this. There has been a tremendous improvement in the wound. It is smaller and has contracted considerably. There is hypertrophic granulation tissue present. No malodorous drainage. 10/16/2021: The wound has contracted further and is epithelializing. The surface is very clean and the hypertrophic granulation tissue has not recurred. She completed her course of doxycycline. Electronic Signature(s) Signed: 10/16/2021 10:59:31 AM By: [name] MD FACS Entered By: [name] on 10/16/2021 10:59:30 -------------------------------------------------------------------------------- Physical Exam Details Patient Name: Date of Service: [name]. 10/16/2021 10:30 A M Medical Record Number: [id number] Patient Account Number: [id number] Date of Birth/Sex: Treating RN: 27-Apr-1945 (76 y.o. F) Primary Care Provider: Domingo Mend Other Clinician: Referring Provider: Treating Provider/Extender: Gevena Cotton Weeks in Treatment: 3 Constitutional Hypertensive, asymptomatic. . . . No acute distress.Marland Kitchen Respiratory Normal work of breathing on room air.. Notes 10/16/2021: The wound has contracted further and is epithelializing. The surface is very clean and the hypertrophic granulation tissue has not recurred. Electronic Signature(s) Signed: 10/16/2021 11:00:03 AM By: [name] MD FACS Entered By: [name] on 10/16/2021 11:00:03 -------------------------------------------------------------------------------- Physician Orders Details Patient Name: Date of Service: [name]. 10/16/2021 10:30 A M Medical Record Number: [id number] Patient Account Number: [id number] Date of Birth/Sex: Treating RN: 02-Jun-1945 (76 y.o. [name] Primary Care Provider: Domingo Mend Other Clinician: Referring Provider: Treating Provider/Extender: Gevena Cotton Weeks in Treatment: 3 Verbal / Phone Orders: No Diagnosis Coding ICD-10 Coding Code Description [phone] 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d T81.31XS Disruption of external operation (surgical) wound, not elsewhere classified, sequela I10 Essential (primary) hypertension C44.722 Squamous cell carcinoma of skin of right lower limb, including hip K50.90 Crohn's disease, unspecified, without complications M01 Unspecified severe protein-calorie malnutrition Follow-up Appointments [name], [name] ([id number]) [id number] Page 3 of 10 ppointment in 1 week. - Dr. [name] RM 1 with Vaughan Basta Return A Anesthetic Wound #2 Right,Lateral Lower Leg (In clinic) Topical Lidocaine 5% applied to wound bed - In clinic, prior to debridement Bathing/ Shower/ Hygiene May shower with protection but do not get wound dressing(s) wet. Edema Control - Lymphedema / SCD / Other Elevate legs to the level of the heart or above for 30 minutes daily and/or when sitting, a frequency of: Avoid standing for long periods of time. Exercise regularly Wound Treatment Wound #2 - Lower Leg Wound Laterality: Right, Lateral Peri-Wound Care: Zinc Oxide Ointment 30g tube 1 x Per Week/30 Days Discharge Instructions: Apply Zinc Oxide to periwound with each dressing change Peri-Wound Care: Sween Lotion (Moisturizing lotion) 1 x Per Week/30  Days Discharge Instructions: Apply moisturizing lotion as directed Prim Dressing: Hydrofera Blue Ready Foam, 2.5 x2.5 in 1 x Per Week/30 Days ary Discharge Instructions: Apply to wound bed as instructed Secondary Dressing: CarboFLEX Odor Control Dressing, 4x4 in 1 x Per Week/30 Days Discharge Instructions: Apply over primary dressing as directed. Secondary Dressing: Woven  Gauze Sponge, Non-Sterile 4x4 in 1 x Per Week/30 Days Discharge Instructions: Apply over primary dressing as directed. Compression Wrap: ThreePress (3 layer compression wrap) 1 x Per Week/30 Days Discharge Instructions: Apply three layer compression as directed. Patient Medications llergies: Demerol, latex, aspirin, ciprofloxacin, codeine, Septra, lactose, adhesive tape, fentanyl A Notifications Medication Indication Start End prior to debridement 10/16/2021 lidocaine DOSE topical 5 % ointment - ointment topical Electronic Signature(s) Signed: 10/16/2021 11:22:15 AM By: [name] MD FACS Signed: 10/16/2021 5:18:07 PM By: Dellie Catholic RN Entered By: Dellie Catholic on 10/16/2021 11:09:03 Prescription 10/16/2021 -------------------------------------------------------------------------------- [name]. [name] MD Patient Name: Provider: December 15, 1945 [id number] Date of Birth: NPI#: [id number] Sex: DEA #: [phone] Phone #: License #: [place] Patient Address: [address], [place] [address] [place], [place] [address] [phone] Allergies Demerol; latex; aspirin; ciprofloxacin; codeine; Septra; lactose; adhesive tape; fentanyl Medication [name], [name] ([id number]) [id number] Page 4 of 10 Medication: Route: Strength: Form: lidocaine topical 5% ointment Class: TOPICAL LOCAL ANESTHETICS Dose: Frequency / Time: Indication: ointment topical prior to  debridement Number of Refills: Number of Units: 0 Generic Substitution: Start Date: End Date: Administered at Facility: Substitution Permitted 19/50/9326 Yes Time Administered: Time Discontinued: Note to Pharmacy: Hand Signature: Date(s): Electronic Signature(s) Signed: 10/16/2021 11:22:15 AM By: [name] MD FACS Signed: 10/16/2021 5:18:07 PM By: Dellie Catholic RN Entered By: Dellie Catholic on 10/16/2021 11:09:03 -------------------------------------------------------------------------------- Problem List Details Patient Name: Date of Service: [name]. 10/16/2021 10:30 A M Medical Record Number: [id number] Patient Account Number: [id number] Date of Birth/Sex: Treating RN: September 07, 1945 (76 y.o. F) Primary Care Provider: Domingo Mend Other Clinician: Referring Provider: Treating Provider/Extender: Gevena Cotton Weeks in Treatment: 3 Active Problems ICD-10 Encounter Code Description Active Date MDM Diagnosis L97.812 Non-pressure chronic ulcer of other part of right lower leg with fat layer 09/20/2021 No Yes exposed T81.31XS Disruption of external operation (surgical) wound, not elsewhere classified, 09/20/2021 No Yes sequela I10 Essential (primary) hypertension 09/20/2021 No Yes C44.722 Squamous cell carcinoma of skin of right lower limb, including hip 09/20/2021 No Yes K50.90 Crohn's disease, unspecified, without complications 8/33/8250 No Yes E43 Unspecified severe protein-calorie malnutrition 09/20/2021 No Yes Inactive Problems Resolved Problems Electronic Signature(s) [name], [name] ([id number]) [id number] Page 5 of 10 Signed: 10/16/2021 10:58:32 AM By: [name] MD FACS Entered By: [name] on 10/16/2021 10:58:31 -------------------------------------------------------------------------------- Progress Note Details Patient Name: Date of Service: [name]. 10/16/2021 10:30 A  M Medical Record Number: [id number] Patient Account Number: [id number] Date of Birth/Sex: Treating RN: Mar 17, 1945 (75 y.o. F) Primary Care Provider: Domingo Mend Other Clinician: Referring Provider: Treating Provider/Extender: Caprice Kluver in Treatment: 3 Subjective Chief Complaint Information obtained from Patient 11/15/2020; patient is here for review of a wound area on the right posterior calf 09/20/2021: Patient is here for a nonhealing wound after undergoing Mohs surgery on her right lower leg. History of Present Illness (HPI) ADMISSION 11/15/2020 This is a 76 year old woman with a history of squamous cell cancer skin cancer followed by Dr. Anabel Bene dermatology. She describes having a wound on the right posterior calf dating back since September. She saw her primary doctor in early October who suggested petroleum infused gauze. The patient has been doing this. No real improvement in fact she says this is getting larger. She does not complain much of pain. She is completely uncertain about how  this happened there was no trauma she simply became aware of it at some point describing it is about the size of a quarter but it has been getting progressively larger since then. She is here for review of this. Past medical history Crohn's disease not currently on prednisone, gastroesophageal reflux disease, history of squamous cell skin cancer, history of rheumatoid arthritis, hypertension 11/16; this lady had a large superficial atypical wound on the right posterior calf. She has significant solar skin damage probably some degree of venous insufficiency but she described a rapidly expanding wound area. Because of this and a history of skin cancer [name] went ahead and did a shave biopsy but we do not have the results of this at the time we saw her in clinic today. She has been using silver alginate and kerlix 11/30; shave biopsy [name] did last time did not show any  malignancy or fungal elements. Suggestion of a stasis ulcer. With our treatment which is simply been silver alginate with kerlix that she is changing daily the wound is down dramatically almost surprising how rapidly it is reduced in surface area 12/14; patient comes in today with her wound healed this was on the right posterior calf. She has significant solar skin damage probably some degree of venous insufficiency however she described a rapidly expanding wound area on admission. Because of this and a history of skin cancer [name] did do a shave biopsy but nothing was really found of worry. 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 Unfortunately, the wound has failed to heal. There is thick slough on the wound surface. The periwound skin is in reasonably good condition. There is granulation tissue forming underneath the slough. 09/28/2021: The wound has deteriorated rather significantly over the past week. She had fairly copious drainage that she noticed as early as Monday, but she did not contact our office. Today there is a lot of periwound skin breakdown and a very pungent odor coming from the wound. There is thick slough on the wound surface but there is still good granulation tissue underlying this. Despite this, however, the wound actually measures smaller. 10/05/2021: The culture that [name] took grew out MRSA. [name] prescribed doxycycline and she is still taking this. There has been a tremendous improvement in the wound. It is smaller and has contracted considerably. There is hypertrophic granulation tissue present. No malodorous drainage. 10/16/2021: The wound has contracted further and is epithelializing. The surface is very clean and the hypertrophic  granulation tissue has not recurred. She completed her course of doxycycline. Patient History Information obtained from Patient. Family History Diabetes - Father, Heart Disease - Father, Hypertension - Father, No family history of Cancer, Hereditary Spherocytosis, Kidney Disease, Lung Disease, Seizures, Stroke, Thyroid Problems, Tuberculosis. Social History Former smoker - quit 30 years ago, Marital Status - Married, Alcohol Use - Never, Drug Use - No History, Caffeine Use - Rarely. Medical History Eyes Patient has history of Cataracts - Had cataract surgery Denies history of Glaucoma, Optic Neuritis Ear/Nose/Mouth/Throat Patient has history of Chronic sinus problems/congestion - Post Nasal drip Denies history of Middle ear problems Hematologic/Lymphatic Denies history of Anemia, Hemophilia, Human Immunodeficiency Virus, Lymphedema, Sickle Cell Disease [name], [name] ([id number]) [id number] Page 6 of 10 Respiratory Denies history of Aspiration,  Asthma, Chronic Obstructive Pulmonary Disease (COPD), Pneumothorax, Sleep Apnea, Tuberculosis Cardiovascular Patient has history of Hypertension Gastrointestinal Patient has history of Crohnoos - Has Chrons Disease Denies history of Cirrhosis , Colitis, Hepatitis A, Hepatitis B, Hepatitis C Endocrine Denies history of Type [name] Diabetes, Type II Diabetes Genitourinary Denies history of End Stage Renal Disease Immunological Denies history of Lupus Erythematosus, Raynaudoos, Scleroderma Musculoskeletal Patient has history of Osteoarthritis - Knees Denies history of Gout, Rheumatoid Arthritis, Osteomyelitis Neurologic Denies history of Dementia, Neuropathy, Quadriplegia, Paraplegia, Seizure Disorder Oncologic Denies history of Received Chemotherapy, Received Radiation Psychiatric Denies history of Anorexia/bulimia, Confinement Anxiety Hospitalization/Surgery History - bil TKA. - Moh's procedure right leg  08/17/21. - bil cataract extractions. - nasal fx surgery. Medical A Surgical History Notes nd Gastrointestinal Has a large hiatal hernia. Has Chrons Disease., GERD, pancreatic insufficiency Endocrine Pancreatic Insufficiency Genitourinary stage 3 Chronic Kidney disease Integumentary (Skin) Had Squamous Cell (skin cancer). Had a skin graft on Right knee (area) Musculoskeletal Had Bil knee replacements, hx cervical spine fx, sternal fx, thoracis spine fx Neurologic left foot drop Oncologic Had Squamous Cell (skin cell) Objective Constitutional Hypertensive, asymptomatic. No acute distress.. Vitals Time Taken: 10:39 AM, Height: 60 in, Weight: 101 lbs, BMI: 19.7, Temperature: 98.2 F, Pulse: 78 bpm, Respiratory Rate: 16 breaths/min, Blood Pressure: 167/95 mmHg. Respiratory Normal work of breathing on room air.. General Notes: 10/16/2021: The wound has contracted further and is epithelializing. The surface is very clean and the hypertrophic granulation tissue has not recurred. Integumentary (Hair, Skin) Wound #2 status is Open. Original cause of wound was Surgical Injury. The date acquired was: 08/17/2021. The wound has been in treatment 3 weeks. The wound is located on the Right,Lateral Lower Leg. The wound measures 3cm length x 1.8cm width x 0.1cm depth; 4.241cm^2 area and 0.424cm^3 volume. There is Fat Layer (Subcutaneous Tissue) exposed. There is no tunneling or undermining noted. There is a large amount of serosanguineous drainage noted. The wound margin is distinct with the outline attached to the wound base. There is large (67-100%) red, hyper - granulation within the wound bed. There is a small (1- 33%) amount of necrotic tissue within the wound bed. The periwound skin appearance did not exhibit: Callus, Crepitus, Excoriation, Induration, Rash, Scarring, Dry/Scaly, Atrophie Blanche, Cyanosis, Ecchymosis, Hemosiderin Staining, Mottled, Pallor, Rubor, Erythema. Assessment Active  Problems ICD-10 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d Disruption of external operation (surgical) wound, not elsewhere classified, sequela Essential (primary) hypertension Squamous cell carcinoma of skin of right lower limb, including hip Crohn's disease, unspecified, without complications Unspecified severe protein-calorie malnutrition [name], [name] ([id number]) [id number] Page 7 of 10 Procedures Wound #2 Pre-procedure diagnosis of Wound #2 is an Open Surgical Wound located on the Right,Lateral Lower Leg . There was a Three Layer Compression Therapy Procedure by Dellie Catholic, RN. Post procedure Diagnosis Wound #2: Same as Pre-Procedure Plan Follow-up Appointments: Return Appointment in 1 week. - Dr. [name] RM 1 with [name] Shower/ Hygiene: May shower with protection but do not get wound dressing(s) wet. Edema Control - Lymphedema / SCD / Other: Elevate legs to the level of the heart or above for 30 minutes daily and/or when sitting, a frequency of: Avoid standing for long periods of time. Exercise regularly WOUND #2: - Lower Leg Wound Laterality: Right, Lateral Peri-Wound Care: Zinc Oxide Ointment 30g tube 1 x Per Week/30 Days Discharge Instructions: Apply Zinc Oxide to periwound with each dressing change Peri-Wound Care: Sween Lotion (Moisturizing lotion)  1 x Per Week/30 Days Discharge Instructions: Apply moisturizing lotion as directed Prim Dressing: Hydrofera Blue Ready Foam, 2.5 x2.5 in 1 x Per Week/30 Days ary Discharge Instructions: Apply to wound bed as instructed Secondary Dressing: CarboFLEX Odor Control Dressing, 4x4 in 1 x Per Week/30 Days Discharge Instructions: Apply over primary dressing as directed. Secondary Dressing: Woven Gauze Sponge, Non-Sterile 4x4 in 1 x Per Week/30 Days Discharge Instructions: Apply over primary dressing as directed. Com pression Wrap: ThreePress (3 layer  compression wrap) 1 x Per Week/30 Days Discharge Instructions: Apply three layer compression as directed. 10/16/2021: The wound has contracted further and is epithelializing. The surface is very clean and the hypertrophic granulation tissue has not recurred. No debridement was necessary this week. We will continue to use the [facility] with 3 layer compression. Follow-up in 1 week. Electronic Signature(s) Signed: 10/16/2021 11:00:38 AM By: [name] MD FACS Entered By: [name] on 10/16/2021 11:00:38 -------------------------------------------------------------------------------- HxROS Details Patient Name: Date of Service: [name]. 10/16/2021 10:30 A M Medical Record Number: [id number] Patient Account Number: [id number] Date of Birth/Sex: Treating RN: 1945-07-06 (76 y.o. F) Primary Care Provider: Domingo Mend Other Clinician: Referring Provider: Treating Provider/Extender: Caprice Kluver in Treatment: 3 Information Obtained From Patient Eyes Medical History: Positive for: Cataracts - Had cataract surgery Negative for: Glaucoma; Optic Neuritis Ear/Nose/Mouth/Throat [name], [name] ([id number]) [id number] Page 8 of 10 Medical History: Positive for: Chronic sinus problems/congestion - Post Nasal drip Negative for: Middle ear problems Hematologic/Lymphatic Medical History: Negative for: Anemia; Hemophilia; Human Immunodeficiency Virus; Lymphedema; Sickle Cell Disease Respiratory Medical History: Negative for: Aspiration; Asthma; Chronic Obstructive Pulmonary Disease (COPD); Pneumothorax; Sleep Apnea; Tuberculosis Cardiovascular Medical History: Positive for: Hypertension Gastrointestinal Medical History: Positive for: Crohns - Has Chrons Disease Negative for: Cirrhosis ; Colitis; Hepatitis A; Hepatitis B; Hepatitis C Past Medical History Notes: Has a large hiatal hernia. Has Chrons  Disease., GERD, pancreatic insufficiency Endocrine Medical History: Negative for: Type [name] Diabetes; Type II Diabetes Past Medical History Notes: Pancreatic Insufficiency Genitourinary Medical History: Negative for: End Stage Renal Disease Past Medical History Notes: stage 3 Chronic Kidney disease Immunological Medical History: Negative for: Lupus Erythematosus; Raynauds; Scleroderma Integumentary (Skin) Medical History: Past Medical History Notes: Had Squamous Cell (skin cancer). Had a skin graft on Right knee (area) Musculoskeletal Medical History: Positive for: Osteoarthritis - Knees Negative for: Gout; Rheumatoid Arthritis; Osteomyelitis Past Medical History Notes: Had Bil knee replacements, hx cervical spine fx, sternal fx, thoracis spine fx Neurologic Medical History: Negative for: Dementia; Neuropathy; Quadriplegia; Paraplegia; Seizure Disorder Past Medical History Notes: left foot drop Oncologic Medical History: Negative for: Received Chemotherapy; Received Radiation Past Medical History Notes: Had Squamous Cell (skin cell) Psychiatric Medical History: Negative for: [name], [name] ([id number]) [id number] Page 9 of 10 HBO Extended History Items Ear/Nose/Mouth/Throat: Eyes: Chronic sinus Cataracts problems/congestion Immunizations Pneumococcal Vaccine: Received Pneumococcal Vaccination: Yes Received Pneumococcal Vaccination On or After 60th Birthday: Yes Tetanus Vaccine: Last tetanus shot: 11/16/2011 Implantable Devices None Hospitalization / Surgery History Type of Hospitalization/Surgery bil TKA Moh's procedure right leg 08/17/21 bil cataract extractions nasal fx surgery Family and Social History Cancer: No; Diabetes: Yes - Father; Heart Disease: Yes - Father; Hereditary Spherocytosis: No; Hypertension: Yes - Father; Kidney Disease: No; Lung Disease: No; Seizures: No; Stroke: No; Thyroid  Problems: No; Tuberculosis: No; Former smoker - quit 30 years ago; Marital Status - Married; Alcohol Use: Never; Drug Use: No History; Caffeine Use: Rarely; Financial Concerns: No;  Food, Games developer or Shelter Needs: No; Support System Lacking: No; Transportation Concerns: No Engineer, maintenance) Signed: 10/16/2021 11:22:15 AM By: [name] MD FACS Entered By: [name] on 10/16/2021 10:59:36 -------------------------------------------------------------------------------- SuperBill Details Patient Name: Date of Service: [name]. 10/16/2021 Medical Record Number: [id number] Patient Account Number: [id number] Date of Birth/Sex: Treating RN: 20-Oct-1945 (76 y.o. 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

## 2021-10-16 NOTE — Progress Notes (Signed)
April, MCALEER [name] ([id number]) [id number] Page 1 of 8 Visit Report for 10/16/2021 Arrival Information Details Patient Name: Date of Service: [name], [name] 10/16/2021 10:30 A M Medical Record Number: [id number] Patient Account Number: [id number] Date of Birth/Sex: Treating RN: [birth year] (76 y.o. [name] Primary Care Lazaria Schaben: Domingo Mend Other Clinician: Referring Deniah Saia: Treating Dorisann Schwanke/Extender: Gevena Cotton Weeks in Treatment: 3 Visit Information History Since Last Visit All ordered tests and consults were completed: Yes Patient Arrived: Ambulatory Added or deleted any medications: No Arrival Time: 10:39 Any new allergies or adverse reactions: No Accompanied By: Husband Had a fall or experienced change in No Transfer Assistance: [name] activities of daily living that may affect Patient Requires Transmission-Based Precautions: No risk of falls: Patient Has Alerts: No Signs or symptoms of abuse/neglect since last visito No Hospitalized since last visit: No Implantable device outside of the clinic excluding No cellular tissue based products placed in the center since last visit: Has Dressing in Place as Prescribed: Yes Has Compression in Place as Prescribed: Yes Pain Present Now: Yes Electronic Signature(s) Signed: 10/16/2021 5:18:07 PM By: Dellie Catholic RN Entered By: Dellie Catholic on 10/16/2021 10:39:33 -------------------------------------------------------------------------------- Compression Therapy Details Patient Name: Date of Service: April Austin [name]. 10/16/2021 10:30 A M Medical Record Number: [id number] Patient Account Number: [id number] Date of Birth/Sex: Treating RN: 01-17-1945 (76 y.o. [name] Primary Care Ryane Canavan: Domingo Mend Other Clinician: Referring Malvina Schadler: Treating Deneene Tarver/Extender: Gevena Cotton Weeks in Treatment: 3 Compression  Therapy Performed for Wound Assessment: Wound #2 Right,Lateral Lower Leg Performed By: Clinician Dellie Catholic, RN Compression Type: Three Layer Post Procedure Diagnosis Same as Pre-procedure Electronic Signature(s) Signed: 10/16/2021 5:18:07 PM By: Dellie Catholic RN Entered By: Dellie Catholic on 10/16/2021 10:57:32 April, Parks [name] ([id number]) [id number] Page 2 of 8 -------------------------------------------------------------------------------- Encounter Discharge Information Details Patient Name: Date of Service: [name], [name] 10/16/2021 10:30 A M Medical Record Number: [id number] Patient Account Number: [id number] Date of Birth/Sex: Treating RN: 1945/03/15 (76 y.o. [name] Primary Care [name]: Domingo Mend Other Clinician: Referring [name]: Treating [name]/Extender: [name] in Treatment: 3 Encounter Discharge Information Items Discharge Condition: Stable Ambulatory Status: Ambulatory Discharge Destination: Home Transportation: Private Auto Accompanied By: husband Schedule Follow-up Appointment: Yes Clinical Summary of Care: Electronic Signature(s) Signed: 10/16/2021 5:18:07 PM By: Dellie Catholic RN Entered By: Dellie Catholic on 10/16/2021 11:07:20 -------------------------------------------------------------------------------- Lower Extremity Assessment Details Patient Name: Date of Service: [name], [name]. 10/16/2021 10:30 A M Medical Record Number: [id number] Patient Account Number: [id number] Date of Birth/Sex: Treating RN: April 25, 1945 (76 y.o. [name] Primary Care Shravya Wickwire: Domingo Mend Other Clinician: Referring [name]: Treating [name]/Extender: Gevena Cotton Weeks in Treatment: 3 Edema Assessment Assessed: [Left: No] [Right: No] Edema: [Left: Ye] [Right: s] Calf Left: Right: Point of Measurement: From Medial Instep 27  cm Ankle Left: Right: Point of Measurement: From Medial Instep 18 cm Vascular Assessment Pulses: Dorsalis Pedis Palpable: [Right:Yes] Electronic Signature(s) Signed: 10/16/2021 5:18:07 PM By: Dellie Catholic RN Entered By: Dellie Catholic on 10/16/2021 10:48:34 Prevost, Parks [name] ([id number]) [id number] Page 3 of 8 -------------------------------------------------------------------------------- Multi Wound Chart Details Patient Name: Date of Service: [name], [name] 10/16/2021 10:30 A M Medical Record Number: [id number] Patient Account Number: [id number] Date of Birth/Sex: Treating RN: December 01, 1945 (76 y.o. F) Primary Care [name]: Domingo Mend Other Clinician: Referring [name]: Treating [name]/Extender: Kerry Fort , Magdalen Spatz  in Treatment: 3 Vital Signs Height(in): 60 Pulse(bpm): 78 Weight(lbs): 101 Blood Pressure(mmHg): 167/95 Body Mass Index(BMI): 19.7 Temperature(F): 98.2 Respiratory Rate(breaths/min): 16 [2:Photos:] [N/A:N/A] Right, Lateral Lower Leg N/A N/A Wound Location: Surgical Injury N/A N/A Wounding Event: Open Surgical Wound N/A N/A Primary Etiology: Venous Leg Ulcer N/A N/A Secondary Etiology: Cataracts, Chronic sinus N/A N/A Comorbid History: problems/congestion, Hypertension, Crohns, Osteoarthritis 08/17/2021 N/A N/A Date Acquired: 3 N/A N/A Weeks of Treatment: Open N/A N/A Wound Status: No N/A N/A Wound Recurrence: Yes N/A N/A Clustered Wound: 3x1.8x0.1 N/A N/A Measurements L x W x D (cm) 4.241 N/A N/A A (cm) : rea 0.424 N/A N/A Volume (cm) : 76.60% N/A N/A % Reduction in Area: 76.60% N/A N/A % Reduction in Volume: Full Thickness Without Exposed N/A N/A Classification: Support Structures Large N/A N/A Exudate Amount: Serosanguineous N/A N/A Exudate Type: red, [name] N/A N/A Exudate Color: Distinct, outline attached N/A N/A Wound Margin: Large (67-100%) N/A  N/A Granulation Amount: Red, Hyper-granulation N/A N/A Granulation Quality: Small (1-33%) N/A N/A Necrotic Amount: Fat Layer (Subcutaneous Tissue): Yes N/A N/A Exposed Structures: Fascia: No Tendon: No Muscle: No Joint: No Bone: No Small (1-33%) N/A N/A Epithelialization: Excoriation: No N/A N/A Periwound Skin Texture: Induration: No Callus: No Crepitus: No Rash: No Scarring: No Dry/Scaly: No N/A N/A Periwound Skin Moisture: Atrophie Blanche: No N/A N/A Periwound Skin Color: Cyanosis: No Ecchymosis: No Erythema: No Hemosiderin Staining: No Mottled: No Pallor: No Rubor: No [name], [name] ([id number]) [id number] Page 4 of 8 Compression Therapy N/A N/A Procedures Performed: Treatment Notes Electronic Signature(s) Signed: 10/16/2021 10:58:37 AM By: [name] MD FACS Entered By: [name] on 10/16/2021 10:58:37 -------------------------------------------------------------------------------- Multi-Disciplinary Care Plan Details Patient Name: Date of Service: April Austin [name]. 10/16/2021 10:30 A M Medical Record Number: [id number] Patient Account Number: [id number] Date of Birth/Sex: Treating RN: 1945-10-01 (76 y.o. [name] Primary Care Alexsander Cavins: Domingo Mend Other Clinician: Referring Zella Dewan: Treating Ainslee Sou/Extender: [name] in Treatment: 3 Multidisciplinary Care Plan reviewed with physician Active Inactive Abuse / Safety / Falls / Self Care Management Nursing Diagnoses: History of Falls Goals: Patient/caregiver will verbalize/demonstrate measures taken to prevent injury and/or falls Date Initiated: 09/20/2021 Target Resolution Date: 10/18/2021 Goal Status: Active Interventions: Assess fall risk on admission and as needed Assess impairment of mobility on admission and as needed per policy Notes: Wound/Skin Impairment Nursing Diagnoses: Impaired tissue  integrity Knowledge deficit related to ulceration/compromised skin integrity Goals: Patient/caregiver will verbalize understanding of skin care regimen Date Initiated: 09/20/2021 Target Resolution Date: 10/18/2021 Goal Status: Active Ulcer/skin breakdown will have a volume reduction of 30% by week 4 Date Initiated: 09/20/2021 Target Resolution Date: 10/18/2021 Goal Status: Active Interventions: Assess patient/caregiver ability to obtain necessary supplies Assess patient/caregiver ability to perform ulcer/skin care regimen upon admission and as needed Assess ulceration(s) every visit Provide education on ulcer and skin care Treatment Activities: Skin care regimen initiated : 09/20/2021 Topical wound management initiated : 09/20/2021 Notes: Electronic Signature(s) Signed: 10/16/2021 5:18:07 PM By: Dellie Catholic RN Biss, Aspyn 10/16/2021 5:18:07 PM By: Dellie Catholic RN Signed: I ([id number]) [id number] Page 5 of 8 Entered By: Dellie Catholic on 10/16/2021 10:56:20 -------------------------------------------------------------------------------- Pain Assessment Details Patient Name: Date of Service: [name], [name] 10/16/2021 10:30 A M Medical Record Number: [id number] Patient Account Number: [id number] Date of Birth/Sex: Treating RN: [birth year] (76 y.o. [name] Primary Care Jacqualyn Sedgwick: Domingo Mend Other Clinician: Referring Zeven Kocak: Treating Brianna Bennett/Extender: Kerry Fort , Olam Idler Weeks in  Treatment: 3 Active Problems Location of Pain Severity and Description of Pain Patient Has Paino Yes Site Locations Rate the pain. Current Pain Level: 4 Character of Pain Describe the Pain: Tender Pain Management and Medication Current Pain Management: Electronic Signature(s) Signed: 10/16/2021 5:18:07 PM By: Dellie Catholic RN Entered By: Dellie Catholic on 10/16/2021  10:42:11 -------------------------------------------------------------------------------- Patient/Caregiver Education Details Patient Name: Date of Service: [name] 10/10/2023andnbsp10:30 [facility] Record Number: [id number] Patient Account Number: [id number] Date of Birth/Gender: Treating RN: 02-02-1945 (76 y.o. [name] Primary Care Physician: Domingo Mend Other Clinician: Referring Physician: Treating Physician/Extender: [name] in Treatment: 3 Education Assessment Education Provided To: KADEIDRA, CORYELL [name] ([id number]) [id number] Page 6 of 8 Patient Education Topics Provided Wound/Skin Impairment: Methods: Explain/Verbal Responses: Reinforcements needed, State content correctly Electronic Signature(s) Signed: 10/16/2021 5:18:07 PM By: Dellie Catholic RN Entered By: Dellie Catholic on 10/16/2021 10:56:34 -------------------------------------------------------------------------------- Wound Assessment Details Patient Name: Date of Service: April Austin [name]. 10/16/2021 10:30 A M Medical Record Number: [id number] Patient Account Number: [id number] Date of Birth/Sex: Treating RN: 1945-12-08 (76 y.o. [name] Primary Care Adan Beal: Domingo Mend Other Clinician: Referring Shatoria Stooksbury: Treating Kamon Fahr/Extender: Gevena Cotton Weeks in Treatment: 3 Wound Status Wound Number: 2 Primary Open Surgical Wound Etiology: Wound Location: Right, Lateral Lower Leg Secondary Venous Leg Ulcer Wounding Event: Surgical Injury Etiology: Date Acquired: 08/17/2021 Wound Status: Open Weeks Of Treatment: 3 Comorbid Cataracts, Chronic sinus problems/congestion, Hypertension, Clustered Wound: Yes History: Crohns, Osteoarthritis Photos Wound Measurements Length: (cm) 3 Width: (cm) 1.8 Depth: (cm) 0.1 Area: (cm) 4.241 Volume: (cm) 0.424 % Reduction in Area: 76.6% %  Reduction in Volume: 76.6% Epithelialization: Small (1-33%) Tunneling: No Undermining: No Wound Description Classification: Full Thickness Without Exposed Suppor Wound Margin: Distinct, outline attached Exudate Amount: Large Exudate Type: Serosanguineous Exudate Color: red, [name] t Structures Foul Odor After Cleansing: No Slough/Fibrino Yes Wound Bed Granulation Amount: Large (67-100%) Exposed Structure Granulation Quality: Red, Hyper-granulation Fascia Exposed: No Necrotic Amount: Small (1-33%) Fat Layer (Subcutaneous Tissue) Exposed: Yes Tendon Exposed: No Muscle Exposed: No Joint Exposed: No Bone Exposed: No Grunder, Merrit [name] ([id number]) [id number] Page 7 of 8 Periwound Skin Texture Texture Color No Abnormalities Noted: No No Abnormalities Noted: No Callus: No Atrophie Blanche: No Crepitus: No Cyanosis: No Excoriation: No Ecchymosis: No Induration: No Erythema: No Rash: No Hemosiderin Staining: No Scarring: No Mottled: No Pallor: No Moisture Rubor: No No Abnormalities Noted: No Dry / Scaly: No Treatment Notes Wound #2 (Lower Leg) Wound Laterality: Right, Lateral Cleanser Peri-Wound Care Zinc Oxide Ointment 30g tube Discharge Instruction: Apply Zinc Oxide to periwound with each dressing change Sween Lotion (Moisturizing lotion) Discharge Instruction: Apply moisturizing lotion as directed Topical Primary Dressing Hydrofera Blue Ready Foam, 2.5 x2.5 in Discharge Instruction: Apply to wound bed as instructed Secondary Dressing CarboFLEX Odor Control Dressing, 4x4 in Discharge Instruction: Apply over primary dressing as directed. Woven Gauze Sponge, Non-Sterile 4x4 in Discharge Instruction: Apply over primary dressing as directed. Secured With Compression Wrap ThreePress (3 layer compression wrap) Discharge Instruction: Apply three layer compression as directed. Compression Stockings Add-Ons Electronic Signature(s) Signed:  10/16/2021 5:18:07 PM By: Dellie Catholic RN Entered By: Dellie Catholic on 10/16/2021 10:53:51 -------------------------------------------------------------------------------- Vitals Details Patient Name: Date of Service: April Austin [name]. 10/16/2021 10:30 A M Medical Record Number: [id number] Patient Account Number: [id number] Date of Birth/Sex: Treating RN: Mar 06, 1945 (76 y.o. [name] Primary Care Dania Marsan: Domingo Mend Other Clinician:  Referring Indea Dearman: Treating Yosef Krogh/Extender: Gevena Cotton Weeks in Treatment: 3 Vital Signs Time Taken: 10:39 Temperature (F): 98.2 Height (in): 60 Pulse (bpm): 78 Weight (lbs): 101 Respiratory Rate (breaths/min): 16 Body Mass Index (BMI): 19.7 Blood Pressure (mmHg): 167/95 ARYIANNA, EARWOOD [name] ([id number]) [id number] Page 8 of 8 Reference Range: 80 - 120 mg / dl Electronic Signature(s) Signed: 10/16/2021 5:18:07 PM By: Dellie Catholic RN Entered By: Dellie Catholic on 10/16/2021 10:41:54

## 2021-10-19 ENCOUNTER — Encounter (HOSPITAL_BASED_OUTPATIENT_CLINIC_OR_DEPARTMENT_OTHER): Payer: Medicare Other | Admitting: Internal Medicine

## 2021-10-19 DIAGNOSIS — X58XXXS Exposure to other specified factors, sequela: Secondary | ICD-10-CM | POA: Diagnosis not present

## 2021-10-19 DIAGNOSIS — T8131XS Disruption of external operation (surgical) wound, not elsewhere classified, sequela: Secondary | ICD-10-CM | POA: Diagnosis not present

## 2021-10-19 DIAGNOSIS — E43 Unspecified severe protein-calorie malnutrition: Secondary | ICD-10-CM | POA: Diagnosis not present

## 2021-10-19 DIAGNOSIS — C44722 Squamous cell carcinoma of skin of right lower limb, including hip: Secondary | ICD-10-CM | POA: Diagnosis not present

## 2021-10-19 DIAGNOSIS — K5090 Crohn's disease, unspecified, without complications: Secondary | ICD-10-CM | POA: Diagnosis not present

## 2021-10-19 DIAGNOSIS — K219 Gastro-esophageal reflux disease without esophagitis: Secondary | ICD-10-CM | POA: Diagnosis not present

## 2021-10-19 DIAGNOSIS — L97812 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d: Secondary | ICD-10-CM | POA: Diagnosis present

## 2021-10-19 DIAGNOSIS — Z96653 Presence of artificial knee joint, bilateral: Secondary | ICD-10-CM | POA: Diagnosis not present

## 2021-10-19 DIAGNOSIS — I10 Essential (primary) hypertension: Secondary | ICD-10-CM | POA: Diagnosis not present

## 2021-10-19 DIAGNOSIS — I872 Venous insufficiency (chronic) (peripheral): Secondary | ICD-10-CM | POA: Diagnosis not present

## 2021-10-19 DIAGNOSIS — Z85828 Personal history of other malignant neoplasm of skin: Secondary | ICD-10-CM | POA: Diagnosis not present

## 2021-10-19 DIAGNOSIS — Z681 Body mass index (BMI) 19 or less, adult: Secondary | ICD-10-CM | POA: Diagnosis not present

## 2021-10-19 DIAGNOSIS — Z87891 Personal history of nicotine dependence: Secondary | ICD-10-CM | POA: Diagnosis not present

## 2021-10-22 NOTE — Progress Notes (Signed)
[name], [name] ([id number]) [id number] Page 1 of 2 Visit Report for 10/19/2021 Problem List Details Patient Name: Date of Service: [name], [name]. 10/19/2021 10:30 A M Medical Record Number: [id number] Patient Account Number: [id number] Date of Birth/Sex: Treating RN: 1945-05-05 (76 y.o. 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 10/19/2021 Medical Record Number: [id number] Patient Account Number: [id number] Date of Birth/Sex: Treating RN: [birth year] (76 y.o. 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

## 2021-10-22 NOTE — Progress Notes (Signed)
April, [name] ([id number]) [id number] Page 1 of 6 Visit Report for 10/19/2021 Arrival Information Details Patient Name: Date of Service: [name], [name]. 10/19/2021 10:30 A M Medical Record Number: [id number] Patient Account Number: [id number] Date of Birth/Sex: Treating RN: [name] (76 y.o. [name], [name] Primary Care Mostafa Yuan: Domingo Mend Other Clinician: Referring Shiraz Bastyr: Treating Christia Coaxum/Extender: Allene Pyo in Treatment: 4 Visit Information History Since Last Visit Added or deleted any medications: No Patient Arrived: Ambulatory Any new allergies or adverse reactions: No Arrival Time: 10:45 Had a fall or experienced change in No Accompanied By: spouse activities of daily living that April affect Transfer Assistance: None risk of falls: Patient Identification Verified: Yes Signs or symptoms of abuse/neglect since last visito No Patient Requires Transmission-Based Precautions: No Hospitalized since last visit: No Patient Has Alerts: No Implantable device outside of the clinic excluding No cellular tissue based products placed in the center since last visit: Has Dressing in Place as Prescribed: Yes Has Compression in Place as Prescribed: Yes Pain Present Now: No Electronic Signature(s) Signed: 10/22/2021 6:17:50 AM By: Baruch Gouty RN, BSN Entered By: Baruch Gouty on 10/19/2021 10:46:01 -------------------------------------------------------------------------------- Compression Therapy Details Patient Name: Date of Service: [name]. 10/19/2021 10:30 A M Medical Record Number: [id number] Patient Account Number: [id number] Date of Birth/Sex: Treating RN: [name] (76 y.o. [name] Primary Care Aven Christen: Domingo Mend Other Clinician: Referring Finn Altemose: Treating Willford Rabideau/Extender: Allene Pyo in Treatment: 4 Compression Therapy Performed  for Wound Assessment: Wound #2 Right,Lateral Lower Leg Performed By: Clinician Baruch Gouty, RN Compression Type: Three Layer Electronic Signature(s) Signed: 10/22/2021 6:17:50 AM By: Baruch Gouty RN, BSN Entered By: Baruch Gouty on 10/19/2021 11:34:53 -------------------------------------------------------------------------------- Encounter Discharge Information Details Patient Name: Date of Service: [name]. 10/19/2021 10:30 A M Medical Record Number: [id number] Patient Account Number: [id number] [name], [name] ([id number]) [id number] Page 2 of 6 Date of Birth/Sex: Treating RN: [name] (76 y.o. [name] Primary Care Avory Mimbs: Other Clinician: Domingo Mend Referring Marek Nghiem: Treating Angles Trevizo/Extender: Allene Pyo in Treatment: 4 Encounter Discharge Information Items Discharge Condition: Stable Ambulatory Status: Ambulatory Discharge Destination: Home Transportation: Private Auto Accompanied By: spouse Schedule Follow-up Appointment: Yes Clinical Summary of Care: Patient Declined Electronic Signature(s) Signed: 10/22/2021 6:17:50 AM By: Baruch Gouty RN, BSN Entered By: Baruch Gouty on 10/19/2021 11:35:26 -------------------------------------------------------------------------------- Lower Extremity Assessment Details Patient Name: Date of Service: [name]. 10/19/2021 10:30 A M Medical Record Number: [id number] Patient Account Number: [id number] Date of Birth/Sex: Treating RN: [name] (76 y.o. [name] Primary Care Alpheus Stiff: Domingo Mend Other Clinician: Referring Caison Hearn: Treating Asa Fath/Extender: Modena Jansky Weeks in Treatment: 4 Edema Assessment Assessed: [Left: No] [Right: No] Edema: [Left: Ye] [Right: s] Calf Left: Right: Point of Measurement: From Medial Instep 24 cm Ankle Left: Right: Point of Measurement:  From Medial Instep 18.8 cm Vascular Assessment Pulses: Dorsalis Pedis Palpable: [Right:Yes] Electronic Signature(s) Signed: 10/22/2021 6:17:50 AM By: Baruch Gouty RN, BSN Entered By: Baruch Gouty on 10/19/2021 10:50:10 -------------------------------------------------------------------------------- Multi-Disciplinary Care Plan Details Patient Name: Date of Service: [name]. 10/19/2021 10:30 A M Medical Record Number: [id number] Patient Account Number: [id number] Date of Birth/Sex: Treating RN: [birth year] (76 y.o. [name] Primary Care Pearlie Nies: Domingo Mend Other Clinician: [name], [name] ([id number]) [id number] Page 3 of 6 Referring Raiya Stainback: Treating Brilee Port/Extender: Ladean Raya , Magdalen Spatz in Treatment:  4 Multidisciplinary Care Plan reviewed with physician Active Inactive Wound/Skin Impairment Nursing Diagnoses: Impaired tissue integrity Knowledge deficit related to ulceration/compromised skin integrity Goals: Patient/caregiver will verbalize understanding of skin care regimen Date Initiated: 09/20/2021 Target Resolution Date: 11/15/2021 Goal Status: Active Ulcer/skin breakdown will have a volume reduction of 30% by week 4 Date Initiated: 09/20/2021 Target Resolution Date: 11/15/2021 Goal Status: Active Interventions: Assess patient/caregiver ability to obtain necessary supplies Assess patient/caregiver ability to perform ulcer/skin care regimen upon admission and as needed Assess ulceration(s) every visit Provide education on ulcer and skin care Treatment Activities: Skin care regimen initiated : 09/20/2021 Topical wound management initiated : 09/20/2021 Notes: Electronic Signature(s) Signed: 10/22/2021 6:17:50 AM By: Baruch Gouty RN, BSN Entered By: Baruch Gouty on 10/19/2021 10:58:15 -------------------------------------------------------------------------------- Pain Assessment  Details Patient Name: Date of Service: [name]. 10/19/2021 10:30 A M Medical Record Number: [id number] Patient Account Number: [id number] Date of Birth/Sex: Treating RN: [name] (76 y.o. [name] Primary Care Brave Dack: Domingo Mend Other Clinician: Referring Agnieszka Newhouse: Treating Brigido Mera/Extender: Allene Pyo in Treatment: 4 Active Problems Location of Pain Severity and Description of Pain Patient Has Paino No Site Locations Rate the pain. [name], [name] ([id number]) [id number] Page 4 of 6 Rate the pain. Current Pain Level: 0 Pain Management and Medication Current Pain Management: Electronic Signature(s) Signed: 10/22/2021 6:17:50 AM By: Baruch Gouty RN, BSN Entered By: Baruch Gouty on 10/19/2021 10:46:29 -------------------------------------------------------------------------------- Patient/Caregiver Education Details Patient Name: Date of Service: [name]. 10/13/2023andnbsp10:30 A M Medical Record Number: [id number] Patient Account Number: [id number] Date of Birth/Gender: Treating RN: [name] (76 y.o. [name] Primary Care Physician: Domingo Mend Other Clinician: Referring Physician: Treating Physician/Extender: Allene Pyo in Treatment: 4 Education Assessment Education Provided To: Patient Education Topics Provided Venous: Methods: Explain/Verbal Responses: Reinforcements needed, State content correctly Wound/Skin Impairment: Methods: Explain/Verbal Responses: Reinforcements needed, State content correctly Electronic Signature(s) Signed: 10/22/2021 6:17:50 AM By: Baruch Gouty RN, BSN Entered By: Baruch Gouty on 10/19/2021 10:58:40 -------------------------------------------------------------------------------- Wound Assessment Details Patient Name: Date of Service: [name]. 10/19/2021 10:30 A [name], [name] ([id number]) [id number] Page 5 of 6 Medical Record Number: [id number] Patient Account Number: [id number] Date of Birth/Sex: Treating RN: [birth year] (76 y.o. [name], [name] Primary Care Harris Penton: Domingo Mend Other Clinician: Referring Jaeanna Mccomber: Treating Cristino Degroff/Extender: Allene Pyo in Treatment: 4 Wound Status Wound Number: 2 Primary Open Surgical Wound Etiology: Wound Location: Right, Lateral Lower Leg Secondary Venous Leg Ulcer Wounding Event: Surgical Injury Etiology: Date Acquired: 08/17/2021 Wound Open Weeks Of Treatment: 4 Status: Clustered Wound: Yes Comorbid Cataracts, Chronic sinus problems/congestion, Hypertension, History: Crohns, Osteoarthritis Photos Wound Measurements Length: (cm) 2.9 Width: (cm) 1.4 Depth: (cm) 0.1 Area: (cm) 3.189 Volume: (cm) 0.319 % Reduction in Area: 82.4% % Reduction in Volume: 82.4% Epithelialization: Small (1-33%) Tunneling: No Undermining: No Wound Description Classification: Full Thickness Without Exposed Support Structures Wound Margin: Distinct, outline attached Exudate Amount: Medium Exudate Type: Sanguinous Exudate Color: red Foul Odor After Cleansing: No Slough/Fibrino No Wound Bed Granulation Amount: Large (67-100%) Exposed Structure Granulation Quality: Red Fascia Exposed: No Necrotic Amount: None Present (0%) Fat Layer (Subcutaneous Tissue) Exposed: Yes Tendon Exposed: No Muscle Exposed: No Joint Exposed: No Bone Exposed: No Periwound Skin Texture Texture Color No Abnormalities Noted: Yes No Abnormalities Noted: Yes Moisture Temperature / Pain No Abnormalities Noted: Yes Temperature: No Abnormality Treatment Notes Wound #2 (Lower Leg) Wound Laterality:  Right, Lateral Cleanser Peri-Wound Care Zinc Oxide Ointment 30g tube Discharge Instruction: Apply Zinc Oxide to periwound with each dressing change Sween Lotion (Moisturizing  lotion) Discharge Instruction: Apply moisturizing lotion as directed Topical Primary Dressing [name], [name] ([id number]) [id number] Page 6 of 6 Hydrofera Blue Ready Foam, 2.5 x2.5 in Discharge Instruction: Apply to wound bed as instructed Secondary Dressing CarboFLEX Odor Control Dressing, 4x4 in Discharge Instruction: Apply over primary dressing as directed. Woven Gauze Sponge, Non-Sterile 4x4 in Discharge Instruction: Apply over primary dressing as directed. Secured With Compression Wrap ThreePress (3 layer compression wrap) Discharge Instruction: Apply three layer compression as directed. Compression Stockings Add-Ons Electronic Signature(s) Signed: 10/22/2021 6:17:50 AM By: Baruch Gouty RN, BSN Entered By: Baruch Gouty on 10/19/2021 10:57:22 -------------------------------------------------------------------------------- Vitals Details Patient Name: Date of Service: [name]. 10/19/2021 10:30 A M Medical Record Number: [id number] Patient Account Number: [id number] Date of Birth/Sex: Treating RN: April [birth year], [name] (76 y.o. [name] Primary Care Farra Nikolic: Domingo Mend Other Clinician: Referring Satcha Storlie: Treating Lashawna Poche/Extender: Allene Pyo in Treatment: 4 Vital Signs Time Taken: 10:46 Temperature (F): 98.4 Height (in): 60 Pulse (bpm): 80 Weight (lbs): 101 Respiratory Rate (breaths/min): 18 Body Mass Index (BMI): 19.7 Blood Pressure (mmHg): 148/91 Reference Range: 80 - 120 mg / dl Electronic Signature(s) Signed: 10/22/2021 6:17:50 AM By: Baruch Gouty RN, BSN Entered By: Baruch Gouty on 10/19/2021 10:46:22

## 2021-10-26 ENCOUNTER — Encounter (HOSPITAL_BASED_OUTPATIENT_CLINIC_OR_DEPARTMENT_OTHER): Payer: Medicare Other | Admitting: Internal Medicine

## 2021-10-26 DIAGNOSIS — L97812 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d: Secondary | ICD-10-CM | POA: Diagnosis not present

## 2021-10-26 NOTE — Progress Notes (Signed)
April [name] [name] ([id number]) [id number] Page 1 of 7 Visit Report for 10/26/2021 Arrival Information Details Patient Name: Date of Service: April [name], April [name] 10/26/2021 10:30 A M Medical Record Number: [id number] Patient Account Number: [id number] Date of Birth/Sex: Treating RN: 1945-06-07 (76 y.o. April [name], April [name] Primary Care April [name]: April [name] Other Clinician: Referring Morissa Obeirne: Treating April [name]/Extender: Allene Pyo in Treatment: 5 Visit Information History Since Last Visit Added or deleted any medications: No Patient Arrived: Ambulatory Any new allergies or adverse reactions: No Arrival Time: 10:33 Had a fall or experienced change in No Accompanied By: spouse activities of daily living that may affect Transfer Assistance: None risk of falls: Patient Identification Verified: Yes Signs or symptoms of abuse/neglect since last visito No Secondary Verification Process Completed: Yes Hospitalized since last visit: No Patient Requires Transmission-Based Precautions: No Implantable device outside of the clinic excluding No Patient Has Alerts: No cellular tissue based products placed in the center since last visit: Has Dressing in Place as Prescribed: Yes Has Compression in Place as Prescribed: Yes Pain Present Now: No Electronic Signature(s) Signed: 10/26/2021 11:14:03 AM By: Baruch Gouty RN, BSN Entered By: Baruch Gouty on 10/26/2021 10:34:12 -------------------------------------------------------------------------------- Compression Therapy Details Patient Name: Date of Service: April [name]. 10/26/2021 10:30 A M Medical Record Number: [id number] Patient Account Number: [id number] Date of Birth/Sex: Treating RN: 1945/11/19 (76 y.o. April [name]: April [name] Other Clinician: Referring Dezaray Shibuya: Treating April [name]/Extender: Allene Pyo  in Treatment: 5 Compression Therapy Performed for Wound Assessment: Wound #2 Right,Lateral Lower Leg Performed By: Clinician Baruch Gouty, RN Compression Type: Three Layer Post Procedure Diagnosis Same as Pre-procedure Electronic Signature(s) Signed: 10/26/2021 11:14:03 AM By: Baruch Gouty RN, BSN Entered By: Baruch Gouty on 10/26/2021 10:58:52 April [name] [name] ([id number]) [id number] Page 2 of 7 -------------------------------------------------------------------------------- Encounter Discharge Information Details Patient Name: Date of Service: April [name] 10/26/2021 10:30 A M Medical Record Number: [id number] Patient Account Number: [id number] Date of Birth/Sex: Treating RN: 1945-09-18 (76 y.o. April [name] Primary Care Early Ord: April [name] Other Clinician: Referring Asriel Westrup: Treating April [name]/Extender: Allene Pyo in Treatment: 5 Encounter Discharge Information Items Discharge Condition: Stable Ambulatory Status: Ambulatory Discharge Destination: Home Transportation: Private Auto Accompanied By: spouse Schedule Follow-up Appointment: Yes Clinical Summary of Care: Patient Declined Electronic Signature(s) Signed: 10/26/2021 11:14:03 AM By: Baruch Gouty RN, BSN Entered By: Baruch Gouty on 10/26/2021 11:10:27 -------------------------------------------------------------------------------- Lower Extremity Assessment Details Patient Name: Date of Service: April [name]. 10/26/2021 10:30 A M Medical Record Number: [id number] Patient Account Number: [id number] Date of Birth/Sex: Treating RN: 11-01-1945 (75 y.o. April [name] Primary Care Hendel Gatliff: April [name] Other Clinician: Referring Dorsey Charette: Treating April [name]/Extender: Allene Pyo in Treatment: 5 Edema Assessment Assessed: [Left: No] [Right: No] Edema: [Left: Ye] [Right: s] Calf Left:  Right: Point of Measurement: From Medial Instep 28.5 cm Ankle Left: Right: Point of Measurement: From Medial Instep 18.5 cm Vascular Assessment Pulses: Dorsalis Pedis Palpable: [Right:Yes] Electronic Signature(s) Signed: 10/26/2021 11:14:03 AM By: Baruch Gouty RN, BSN Entered By: Baruch Gouty on 10/26/2021 10:43:23 Multi Wound Chart Details -------------------------------------------------------------------------------- April [name] ([id number]) [id number] Page 3 of 7 Patient Name: Date of Service: April [name], April [name] 10/26/2021 10:30 A M Medical Record Number: [id number] Patient Account Number: [id number] Date of Birth/Sex: Treating RN: 1945/02/20 (76 y.o. F) Primary Care Adamari Frede: April [name] Other Clinician: Referring Shonteria Abeln: Treating  Tessah Patchen/Extender: Ladean Raya , Olam Idler Weeks in Treatment: 5 Vital Signs Height(in): 60 Pulse(bpm): 75 Weight(lbs): 101 Blood Pressure(mmHg): 185/76 Body Mass Index(BMI): 19.7 Temperature(F): 98 Respiratory Rate(breaths/min): 18 Wound Assessments Wound Number: 2 N/A N/A Photos: N/A N/A Right, Lateral Lower Leg N/A N/A Wound Location: Surgical Injury N/A N/A Wounding Event: Open Surgical Wound N/A N/A Primary Etiology: Venous Leg Ulcer N/A N/A Secondary Etiology: Cataracts, Chronic sinus N/A N/A Comorbid History: problems/congestion, Hypertension, Crohns, Osteoarthritis 08/17/2021 N/A N/A Date Acquired: 5 N/A N/A Weeks of Treatment: Open N/A N/A Wound Status: No N/A N/A Wound Recurrence: Yes N/A N/A Clustered Wound: 2.4x1x0.1 N/A N/A Measurements L x W x D (cm) 1.885 N/A N/A A (cm) : rea 0.188 N/A N/A Volume (cm) : 89.60% N/A N/A % Reduction in Area: 89.60% N/A N/A % Reduction in Volume: Full Thickness Without Exposed N/A N/A Classification: Support Structures Medium N/A N/A Exudate Amount: Serosanguineous N/A N/A Exudate Type: red, brown N/A  N/A Exudate Color: Distinct, outline attached N/A N/A Wound Margin: Large (67-100%) N/A N/A Granulation Amount: Red N/A N/A Granulation Quality: None Present (0%) N/A N/A Necrotic Amount: Fat Layer (Subcutaneous Tissue): Yes N/A N/A Exposed Structures: Fascia: No Tendon: No Muscle: No Joint: No Bone: No Small (1-33%) N/A N/A Epithelialization: Excoriation: No N/A N/A Periwound Skin Texture: Induration: No Callus: No Crepitus: No Rash: No Scarring: No Dry/Scaly: No N/A N/A Periwound Skin Moisture: Atrophie Blanche: No N/A N/A Periwound Skin Color: Cyanosis: No Ecchymosis: No Erythema: No Hemosiderin Staining: No Mottled: No Pallor: No Rubor: No No Abnormality N/A N/A Temperature: Compression Therapy N/A N/A Procedures Performed: Treatment Notes Electronic Signature(s) Signed: 10/26/2021 4:47:23 PM By: [name] MD Raftery, Wheeler 10/26/2021 4:47:23 PM By: [name] MD Signed: I ([id number]) [id number] Page 4 of 7 Entered By: [name] on 10/26/2021 11:01:08 -------------------------------------------------------------------------------- Multi-Disciplinary Care Plan Details Patient Name: Date of Service: April [name], April [name] 10/26/2021 10:30 A M Medical Record Number: [id number] Patient Account Number: [id number] Date of Birth/Sex: Treating RN: October 26, 1945 (76 y.o. April [name] Primary Care Sourish Allender: April [name] Other Clinician: Referring Kinlie Janice: Treating Eziah Negro/Extender: Allene Pyo in Treatment: [place] reviewed with physician Active Inactive Wound/Skin Impairment Nursing Diagnoses: Impaired tissue integrity Knowledge deficit related to ulceration/compromised skin integrity Goals: Patient/caregiver will verbalize understanding of skin care regimen Date Initiated: 09/20/2021 Target Resolution Date: 11/15/2021 Goal Status: Active Ulcer/skin  breakdown will have a volume reduction of 30% by week 4 Date Initiated: 09/20/2021 Target Resolution Date: 11/15/2021 Goal Status: Active Interventions: Assess patient/caregiver ability to obtain necessary supplies Assess patient/caregiver ability to perform ulcer/skin care regimen upon admission and as needed Assess ulceration(s) every visit Provide education on ulcer and skin care Treatment Activities: Skin care regimen initiated : 09/20/2021 Topical wound management initiated : 09/20/2021 Notes: Electronic Signature(s) Signed: 10/26/2021 11:14:03 AM By: Baruch Gouty RN, BSN Entered By: Baruch Gouty on 10/26/2021 10:50:19 -------------------------------------------------------------------------------- Pain Assessment Details Patient Name: Date of Service: April [name]. 10/26/2021 10:30 A M Medical Record Number: [id number] Patient Account Number: [id number] Date of Birth/Sex: Treating RN: 09/10/1945 (76 y.o. April [name] Primary Care Jett Kulzer: April [name] Other Clinician: Referring [place] Huberty: Treating Nakhia Levitan/Extender: Allene Pyo in Treatment: 5 Active Problems Location of Pain Severity and Description of Pain Patient Has Paino No DEVORY, MCKINZIE [name] ([id number]) [id number] Page 5 of 7 Patient Has Paino No Site Locations Rate the pain. Current Pain Level: 0 Pain Management and Medication Current Pain Management:  Electronic Signature(s) Signed: 10/26/2021 11:14:03 AM By: Baruch Gouty RN, BSN Entered By: Baruch Gouty on 10/26/2021 10:35:18 -------------------------------------------------------------------------------- Patient/Caregiver Education Details Patient Name: Date of Service: April [name] 10/20/2023andnbsp10:30 A M Medical Record Number: [id number] Patient Account Number: [id number] Date of Birth/Gender: Treating RN: [birth year] (76 y.o. April [name] Primary Care Physician:  April [name] Other Clinician: Referring Physician: Treating Physician/Extender: Allene Pyo in Treatment: 5 Education Assessment Education Provided To: Patient Education Topics Provided Venous: Methods: Explain/Verbal Responses: Reinforcements needed, State content correctly Wound/Skin Impairment: Methods: Explain/Verbal Responses: Reinforcements needed, State content correctly Electronic Signature(s) Signed: 10/26/2021 11:14:03 AM By: Baruch Gouty RN, BSN Entered By: Baruch Gouty on 10/26/2021 10:50:38 April [name] ([id number]) [id number] Page 6 of 7 -------------------------------------------------------------------------------- Wound Assessment Details Patient Name: Date of Service: April [name], April [name]. 10/26/2021 10:30 A M Medical Record Number: [id number] Patient Account Number: [id number] Date of Birth/Sex: Treating RN: 1945-05-01 (76 y.o. April [name], April [name] Primary Care Koy Lamp: April [name] Other Clinician: Referring Dewel Lotter: Treating Druscilla Petsch/Extender: Allene Pyo in Treatment: 5 Wound Status Wound Number: 2 Primary Open Surgical Wound Etiology: Wound Location: Right, Lateral Lower Leg Secondary Venous Leg Ulcer Wounding Event: Surgical Injury Etiology: Date Acquired: 08/17/2021 Wound Status: Open Weeks Of Treatment: 5 Comorbid Cataracts, Chronic sinus problems/congestion, Hypertension, Clustered Wound: Yes History: Crohns, Osteoarthritis Photos Wound Measurements Length: (cm) 2.4 Width: (cm) 1 Depth: (cm) 0.1 Area: (cm) 1.885 Volume: (cm) 0.188 % Reduction in Area: 89.6% % Reduction in Volume: 89.6% Epithelialization: Small (1-33%) Tunneling: No Undermining: No Wound Description Classification: Full Thickness Without Exposed Support Structures Wound Margin: Distinct, outline attached Exudate Amount: Medium Exudate Type: Serosanguineous Exudate  Color: red, brown Foul Odor After Cleansing: No Slough/Fibrino No Wound Bed Granulation Amount: Large (67-100%) Exposed Structure Granulation Quality: Red Fascia Exposed: No Necrotic Amount: None Present (0%) Fat Layer (Subcutaneous Tissue) Exposed: Yes Tendon Exposed: No Muscle Exposed: No Joint Exposed: No Bone Exposed: No Periwound Skin Texture Texture Color No Abnormalities Noted: Yes No Abnormalities Noted: Yes Moisture Temperature / Pain No Abnormalities Noted: Yes Temperature: No Abnormality Treatment Notes Wound #2 (Lower Leg) Wound Laterality: Right, Lateral Cleanser Peri-Wound Care Zinc Oxide Ointment 30g tube Discharge Instruction: Apply Zinc Oxide to periwound with each dressing change Sween Lotion (Moisturizing lotion) Discharge Instruction: Apply moisturizing lotion as directed April [name], April [name] ([id number]) [id number] Page 7 of 7 Topical Primary Dressing Hydrofera Blue Ready Foam, 2.5 x2.5 in Discharge Instruction: Apply to wound bed as instructed Secondary Dressing Woven Gauze Sponge, Non-Sterile 4x4 in Discharge Instruction: Apply over primary dressing as directed. Secured With Compression Wrap ThreePress (3 layer compression wrap) Discharge Instruction: Apply three layer compression as directed. Compression Stockings Add-Ons Electronic Signature(s) Signed: 10/26/2021 11:14:03 AM By: Baruch Gouty RN, BSN Entered By: Baruch Gouty on 10/26/2021 10:48:27 -------------------------------------------------------------------------------- Vitals Details Patient Name: Date of Service: April [name]. 10/26/2021 10:30 A M Medical Record Number: [id number] Patient Account Number: [id number] Date of Birth/Sex: Treating RN: 1945/01/16 (76 y.o. April [name] Primary Care Linnell Swords: April [name] Other Clinician: Referring Madlynn Lundeen: Treating Sadae Arrazola/Extender: Allene Pyo in Treatment:  5 Vital Signs Time Taken: 10:35 Temperature (F): 98 Height (in): 60 Pulse (bpm): 75 Weight (lbs): 101 Respiratory Rate (breaths/min): 18 Body Mass Index (BMI): 19.7 Blood Pressure (mmHg): 185/76 Reference Range: 80 - 120 mg / dl Electronic Signature(s) Signed: 10/26/2021 11:14:03 AM By: Baruch Gouty RN, BSN Entered By: Baruch Gouty on 10/26/2021 10:35:44

## 2021-10-26 NOTE — Progress Notes (Signed)
[name] ([id number]) [id number] Page 1 of 6 Visit Report for 10/26/2021 HPI Details Patient Name: Date of Service: [name], [name]. 10/26/2021 10:30 A M Medical Record Number: [id number] Patient Account Number: [id number] Date of Birth/Sex: Treating RN: 03-01-1945 (76 y.o. F) Primary Care Provider: Domingo [name] Other Clinician: Referring Provider: Treating Provider/Extender: [name] , [name] in Treatment: 5 History of Present Illness HPI Description: ADMISSION 11/15/2020 This is a 76 year old woman with a history of squamous cell cancer skin cancer followed by Dr. Anabel Bene dermatology. She describes having a wound on the right posterior calf dating back since September. She saw her primary doctor in early October who suggested petroleum infused gauze. The patient has been doing this. No real improvement in fact she says this is getting larger. She does not complain much of pain. She is completely uncertain about how this happened there was no trauma she simply became aware of it at some point describing it is about the size of a quarter but it has been getting progressively larger since then. She is here for review of this. Past medical history Crohn's disease not currently on prednisone, gastroesophageal reflux disease, history of squamous cell skin cancer, history of rheumatoid arthritis, hypertension 11/16; this lady had a large superficial atypical wound on the right posterior calf. She has significant solar skin damage probably some degree of venous insufficiency but she described a rapidly expanding wound area. Because of this and a history of skin cancer [name] went ahead and did a shave biopsy but we do not have the results of this at the time we saw her in clinic today. She has been using silver alginate and kerlix 11/30; shave biopsy [name] did last time did not show any malignancy or fungal elements. Suggestion of a stasis  ulcer. With our treatment which is simply been silver alginate with kerlix that she is changing daily the wound is down dramatically almost surprising how rapidly it is reduced in surface area 12/14; patient comes in today with her wound healed this was on the right posterior calf. She has significant solar skin damage probably some degree of venous insufficiency however she described a rapidly expanding wound area on admission. Because of this and a history of skin cancer [name] did do a shave biopsy but nothing was really found of worry. 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 Unfortunately, the wound has failed to heal. There is thick slough on the wound surface. The periwound skin is in reasonably good condition. There is granulation tissue forming underneath the slough. 09/28/2021: The wound has deteriorated rather significantly over the past week. She had fairly copious drainage that she noticed as early as Monday, but she did not contact our office. Today there is a lot of periwound skin breakdown and a very pungent odor coming from the wound. There is thick slough on the wound surface but there is still good granulation tissue underlying this. Despite this, however, the wound actually measures smaller. 10/05/2021: The culture that [name] took grew out MRSA. [name] prescribed doxycycline and she is still taking this. There has been a tremendous improvement in the wound. It is smaller and has contracted considerably. There is hypertrophic granulation tissue present. No malodorous drainage. 10/16/2021:  The wound has contracted further and is epithelializing. The surface is very clean and the hypertrophic granulation tissue has not recurred. She completed her  course of doxycycline. 10/19; surgical wound on the right medial lower extremity secondary to Mohs surgery done in August. She is using Hydrofera Blue under 3 layer compression and making excellent progress. Electronic Signature(s) Signed: 10/26/2021 4:47:23 PM By: April Ham MD Entered By: [name] on 10/26/2021 11:01:46 -------------------------------------------------------------------------------- Physical Exam Details Patient Name: Date of Service: [name]. 10/26/2021 10:30 A M Medical Record Number: [id number] Patient Account Number: [id number] Date of Birth/Sex: Treating RN: 1945/09/09 (76 y.o. F) Primary Care Provider: Domingo [name] Other Clinician: Referring Provider: Treating Provider/Extender: [name] ([id number]) [id number] Page 2 of 6 Weeks in Treatment: 5 Constitutional Patient is hypertensive.. Pulse regular and within target range for patient.Marland Kitchen Respirations regular, non-labored and within target range.. Temperature is normal and within the target range for the patient.Marland Kitchen Appears in no distress. Notes Wound exam; nice healthy looking tissue which is half a centimeter smaller in length today. No surrounding erythema. Some degree of chronic venous insufficiency but the hypertrophic granulation that was problematic earlier in the month has not reoccurred Electronic Signature(s) Signed: 10/26/2021 4:47:23 PM By: April Ham MD Entered By: [name] on 10/26/2021 11:02:28 -------------------------------------------------------------------------------- Physician Orders Details Patient Name: Date of Service: [name]. 10/26/2021 10:30 A M Medical Record Number: [id number] Patient Account Number: [id number] Date of Birth/Sex: Treating RN: 05/25/1945 (76 y.o. [name], [name] Primary Care Provider: Domingo [name] Other Clinician: Referring Provider: Treating  Provider/Extender: [name] in Treatment: 5 Verbal / Phone Orders: No Diagnosis Coding ICD-10 Coding Code Description [phone] 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d T81.31XS Disruption of external operation (surgical) wound, not elsewhere classified, sequela I10 Essential (primary) hypertension C44.722 Squamous cell carcinoma of skin of right lower limb, including hip K50.90 Crohn's disease, unspecified, without complications U13 Unspecified severe protein-calorie malnutrition Follow-up Appointments ppointment in 1 week. - Dr. [name] RM 1 Return A Anesthetic Wound #2 Right,Lateral Lower Leg (In clinic) Topical Lidocaine 4% applied to wound bed Bathing/ Shower/ Hygiene May shower with protection but do not get wound dressing(s) wet. Edema Control - Lymphedema / SCD / Other Elevate legs to the level of the heart or above for 30 minutes daily and/or when sitting, a frequency of: Avoid standing for long periods of time. Exercise regularly Wound Treatment Wound #2 - Lower Leg Wound Laterality: Right, Lateral Peri-Wound Care: Zinc Oxide Ointment 30g tube 1 x Per Week/30 Days Discharge Instructions: Apply Zinc Oxide to periwound with each dressing change Peri-Wound Care: Sween Lotion (Moisturizing lotion) 1 x Per Week/30 Days Discharge Instructions: Apply moisturizing lotion as directed Prim Dressing: Hydrofera Blue Ready Foam, 2.5 x2.5 in 1 x Per Week/30 Days ary Discharge Instructions: Apply to wound bed as instructed Secondary Dressing: Woven Gauze Sponge, Non-Sterile 4x4 in 1 x Per Week/30 Days Discharge Instructions: Apply over primary dressing as directed. Compression Wrap: ThreePress (3 layer compression wrap) 1 x Per Week/30 Days [name], [name] ([id number]) [id number] Page 3 of 6 Discharge Instructions: Apply three layer compression as directed. Electronic  Signature(s) Signed: 10/26/2021 11:14:03 AM By: Baruch Gouty RN, BSN Signed: 10/26/2021 4:47:23 PM By: April Ham MD Entered By: Baruch Gouty on 10/26/2021 11:00:08 -------------------------------------------------------------------------------- Problem List Details Patient Name: Date of Service: [name]. 10/26/2021 10:30 A  M Medical Record Number: [id number] Patient Account Number: [id number] Date of Birth/Sex: Treating RN: Aug 25, 1945 (76 y.o. [name] Primary Care Provider: Domingo [name] Other Clinician: Referring Provider: Treating Provider/Extender: [name] in Treatment: 5 Active Problems ICD-10 Encounter Code Description Active Date MDM Diagnosis L97.812 Non-pressure chronic ulcer of other part of right lower leg with fat layer 09/20/2021 No Yes exposed T81.31XS Disruption of external operation (surgical) wound, not elsewhere classified, 09/20/2021 No Yes sequela I10 Essential (primary) hypertension 09/20/2021 No Yes C44.722 Squamous cell carcinoma of skin of right lower limb, including hip 09/20/2021 No Yes K50.90 Crohn's disease, unspecified, without complications 0/93/2671 No Yes E43 Unspecified severe protein-calorie malnutrition 09/20/2021 No Yes Inactive Problems Resolved Problems Electronic Signature(s) Signed: 10/26/2021 4:47:23 PM By: April Ham MD Entered By: [name] on 10/26/2021 11:00:50 [name] ([id number]) [id number] Page 4 of 6 -------------------------------------------------------------------------------- Progress Note Details Patient Name: Date of Service: [name], [name]. 10/26/2021 10:30 A M Medical Record Number: [id number] Patient Account Number: [id number] Date of Birth/Sex: Treating RN: Sep 30, 1945 (76 y.o. F) Primary Care Provider: Domingo [name] Other Clinician: Referring Provider: Treating Provider/Extender: [name] , [name] in Treatment: 5 Subjective History of Present Illness (HPI) ADMISSION 11/15/2020 This is a 76 year old woman with a history of squamous cell cancer skin cancer followed by Dr. Anabel Bene dermatology. She describes having a wound on the right posterior calf dating back since September. She saw her primary doctor in early October who suggested petroleum infused gauze. The patient has been doing this. No real improvement in fact she says this is getting larger. She does not complain much of pain. She is completely uncertain about how this happened there was no trauma she simply became aware of it at some point describing it is about the size of a quarter but it has been getting progressively larger since then. She is here for review of this. Past medical history Crohn's disease not currently on prednisone, gastroesophageal reflux disease, history of squamous cell skin cancer, history of rheumatoid arthritis, hypertension 11/16; this lady had a large superficial atypical wound on the right posterior calf. She has significant solar skin damage probably some degree of venous insufficiency but she described a rapidly expanding wound area. Because of this and a history of skin cancer [name] went ahead and did a shave biopsy but we do not have the results of this at the time we saw her in clinic today. She has been using silver alginate and kerlix 11/30; shave biopsy [name] did last time did not show any malignancy or fungal elements. Suggestion of a stasis ulcer. With our treatment which is simply been silver alginate with kerlix that she is changing daily the wound is down dramatically almost surprising how rapidly it is reduced in surface area 12/14; patient comes in today with her wound healed this was on the right posterior calf. She has significant solar skin damage probably some degree of venous insufficiency however she described a rapidly expanding wound area on admission.  Because of this and a history of skin cancer [name] did do a shave biopsy but nothing was really found of worry. 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 Unfortunately, the wound has failed to  heal. There is thick slough on the wound surface. The periwound skin is in reasonably good condition. There is granulation tissue forming underneath the slough. 09/28/2021: The wound has deteriorated rather significantly over the past week. She had fairly copious drainage that she noticed as early as Monday, but she did not contact our office. Today there is a lot of periwound skin breakdown and a very pungent odor coming from the wound. There is thick slough on the wound surface but there is still good granulation tissue underlying this. Despite this, however, the wound actually measures smaller. 10/05/2021: The culture that [name] took grew out MRSA. [name] prescribed doxycycline and she is still taking this. There has been a tremendous improvement in the wound. It is smaller and has contracted considerably. There is hypertrophic granulation tissue present. No malodorous drainage. 10/16/2021: The wound has contracted further and is epithelializing. The surface is very clean and the hypertrophic granulation tissue has not recurred. She completed her course of doxycycline. 10/19; surgical wound on the right medial lower extremity secondary to Mohs surgery done in August. She is using Hydrofera Blue under 3 layer compression and making excellent progress. Objective Constitutional Patient is hypertensive.. Pulse regular and within target range for patient.Marland Kitchen Respirations regular, non-labored and within target range.. Temperature is normal and within the target range for the  patient.Marland Kitchen Appears in no distress. Vitals Time Taken: 10:35 AM, Height: 60 in, Weight: 101 lbs, BMI: 19.7, Temperature: 98 F, Pulse: 75 bpm, Respiratory Rate: 18 breaths/min, Blood Pressure: 185/76 mmHg. General Notes: Wound exam; nice healthy looking tissue which is half a centimeter smaller in length today. No surrounding erythema. Some degree of chronic venous insufficiency but the hypertrophic granulation that was problematic earlier in the month has not reoccurred Integumentary (Hair, Skin) Wound #2 status is Open. Original cause of wound was Surgical Injury. The date acquired was: 08/17/2021. The wound has been in treatment 5 weeks. The wound is located on the Right,Lateral Lower Leg. The wound measures 2.4cm length x 1cm width x 0.1cm depth; 1.885cm^2 area and 0.188cm^3 volume. There is Fat Layer (Subcutaneous Tissue) exposed. There is no tunneling or undermining noted. There is a medium amount of serosanguineous drainage noted. The wound margin is distinct with the outline attached to the wound base. There is large (67-100%) red granulation within the wound bed. There is no necrotic tissue within the wound bed. The periwound skin appearance had no abnormalities noted for texture. The periwound skin appearance had no abnormalities noted for moisture. The periwound skin appearance had no abnormalities noted for color. Periwound temperature was noted as No Abnormality. [name], [name] ([id number]) [id number] Page 5 of 6 Assessment Active Problems ICD-10 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d Disruption of external operation (surgical) wound, not elsewhere classified, sequela Essential (primary) hypertension Squamous cell carcinoma of skin of right lower limb, including hip Crohn's disease, unspecified, without complications Unspecified severe protein-calorie malnutrition Procedures Wound #2 Pre-procedure diagnosis of  Wound #2 is an Open Surgical Wound located on the Right,Lateral Lower Leg . There was a Three Layer Compression Therapy Procedure by Baruch Gouty, RN. Post procedure Diagnosis Wound #2: Same as Pre-Procedure Plan Follow-up Appointments: Return Appointment in 1 week. - Dr. [name] RM 1 Anesthetic: Wound #2 Right,Lateral Lower Leg: (In clinic) Topical Lidocaine 4% applied to wound bed Bathing/ Shower/ Hygiene: May shower with protection but do not get wound dressing(s) wet. Edema Control - Lymphedema / SCD / Other: Elevate legs to  the level of the heart or above for 30 minutes daily and/or when sitting, a frequency of: Avoid standing for long periods of time. Exercise regularly WOUND #2: - Lower Leg Wound Laterality: Right, Lateral Peri-Wound Care: Zinc Oxide Ointment 30g tube 1 x Per Week/30 Days Discharge Instructions: Apply Zinc Oxide to periwound with each dressing change Peri-Wound Care: Sween Lotion (Moisturizing lotion) 1 x Per Week/30 Days Discharge Instructions: Apply moisturizing lotion as directed Prim Dressing: Hydrofera Blue Ready Foam, 2.5 x2.5 in 1 x Per Week/30 Days ary Discharge Instructions: Apply to wound bed as instructed Secondary Dressing: Woven Gauze Sponge, Non-Sterile 4x4 in 1 x Per Week/30 Days Discharge Instructions: Apply over primary dressing as directed. Com pression Wrap: ThreePress (3 layer compression wrap) 1 x Per Week/30 Days Discharge Instructions: Apply three layer compression as directed. 1. The patient's wound is clean and does not require debridement. 2. Surface granulation looks very healthy 3. No evidence of infection 4. No evidence of any factor that should lead [place] to change the primary dressing which is Hydrofera Blue under 3 layer compression which we replaced Electronic Signature(s) Signed: 10/26/2021 4:47:23 PM By: April Ham MD Entered By: [name] on 10/26/2021  11:03:29 -------------------------------------------------------------------------------- SuperBill Details Patient Name: Date of Service: [name]. 10/26/2021 Medical Record Number: [id number] Patient Account Number: [id number] Date of Birth/Sex: Treating RN: 1945-12-02 (76 y.o. [name] Primary Care Provider: Domingo [name] Other Clinician: Referring Provider: Treating Provider/Extender: [name] in Treatment: 5 [name], [name] ([id number]) [id number] Page 6 of 6 Diagnosis Coding ICD-10 Codes Code Description [phone] 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d T81.31XS Disruption of external operation (surgical) wound, not elsewhere classified, sequela I10 Essential (primary) hypertension C44.722 Squamous cell carcinoma of skin of right lower limb, including hip K50.90 Crohn's disease, unspecified, without complications J94 Unspecified severe protein-calorie malnutrition Facility Procedures : CPT4 Code: 17408144 Description: (Facility Use Only) [id number] - APPLY MULTLAY COMPRS LWR RT LEG Modifier: Quantity: 1 Physician Procedures : CPT4 Code Description Modifier [id number] - WC PHYS LEVEL 3 - EST PT ICD-10 Diagnosis Description F02.774 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d T81.31XS Disruption of external operation (surgical) wound,  not elsewhere classified, sequela Quantity: 1 Electronic Signature(s) Signed: 10/26/2021 4:47:23 PM By: April Ham MD Entered By: [name] on 10/26/2021 11:03:43

## 2021-10-29 ENCOUNTER — Other Ambulatory Visit: Payer: Self-pay

## 2021-10-29 DIAGNOSIS — G5732 Lesion of lateral popliteal nerve, left lower limb: Secondary | ICD-10-CM

## 2021-10-31 ENCOUNTER — Encounter: Payer: Self-pay | Admitting: Internal Medicine

## 2021-10-31 ENCOUNTER — Ambulatory Visit (INDEPENDENT_AMBULATORY_CARE_PROVIDER_SITE_OTHER): Payer: Medicare Other | Admitting: Internal Medicine

## 2021-10-31 VITALS — BP 142/92 | HR 73 | Ht 59.0 in | Wt 102.0 lb

## 2021-10-31 DIAGNOSIS — K5010 Crohn's disease of large intestine without complications: Secondary | ICD-10-CM

## 2021-10-31 DIAGNOSIS — R197 Diarrhea, unspecified: Secondary | ICD-10-CM

## 2021-10-31 DIAGNOSIS — K219 Gastro-esophageal reflux disease without esophagitis: Secondary | ICD-10-CM | POA: Diagnosis not present

## 2021-10-31 MED ORDER — OMEPRAZOLE 40 MG PO CPDR: 40.0000 mg | DELAYED_RELEASE_CAPSULE | Freq: Two times a day (BID) | ORAL | 1 refills | Status: DC

## 2021-10-31 MED ORDER — AMOXICILLIN 250 MG PO CAPS: 250.0000 mg | ORAL_CAPSULE | Freq: Two times a day (BID) | ORAL | 3 refills | Status: DC

## 2021-10-31 MED ORDER — DICYCLOMINE HCL 20 MG PO TABS: 20.0000 mg | ORAL_TABLET | Freq: Four times a day (QID) | ORAL | 3 refills | Status: DC | PRN

## 2021-10-31 MED ORDER — AMOXICILLIN-POT CLAVULANATE 250-125 MG PO TABS: 1.0000 | ORAL_TABLET | Freq: Two times a day (BID) | ORAL | 3 refills | Status: DC

## 2021-10-31 MED ORDER — DIPHENOXYLATE-ATROPINE 2.5-0.025 MG PO TABS: 2.0000 | ORAL_TABLET | Freq: Three times a day (TID) | ORAL | 6 refills | Status: DC

## 2021-10-31 MED ORDER — COLESTIPOL HCL 1 G PO TABS: 2.0000 g | ORAL_TABLET | Freq: Two times a day (BID) | ORAL | 3 refills | Status: DC

## 2021-10-31 MED ORDER — MESALAMINE 1.2 G PO TBEC: 4.8000 g | DELAYED_RELEASE_TABLET | Freq: Every day | ORAL | 3 refills | Status: DC

## 2021-10-31 NOTE — Patient Instructions (Addendum)
We have sent the following medications to your pharmacy for you to pick up at your convenience: Augmentin   Follow-up in 3 months. Office to contact with an appointment at a later time.   _______________________________________________________  If you are age 76 or older, your body mass index should be between 23-30. Your Body mass index is 20.6 kg/m. If this is out of the aforementioned range listed, please consider follow up with your Primary Care Provider.  If you are age 85 or younger, your body mass index should be between 19-25. Your Body mass index is 20.6 kg/m. If this is out of the aformentioned range listed, please consider follow up with your Primary Care Provider.   ________________________________________________________  The [facility] providers would like to encourage you to use [facility] to communicate with providers for non-urgent requests or questions.  Due to long hold times on the telephone, sending your provider a message by [facility] may be a faster and more efficient way to get a response.  Please allow 48 business hours for a response.  Please remember that this is for non-urgent requests.  _______________________________________________________  It was a pleasure to see you today!  Thank you for trusting me with your gastrointestinal care!
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
## 2021-11-02 ENCOUNTER — Encounter (HOSPITAL_BASED_OUTPATIENT_CLINIC_OR_DEPARTMENT_OTHER): Payer: Medicare Other | Admitting: General Surgery

## 2021-11-02 DIAGNOSIS — L97812 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d: Secondary | ICD-10-CM | POA: Diagnosis not present

## 2021-11-02 NOTE — Progress Notes (Signed)
April, [name] [name] ([id number]) [id number] Page 1 of 8 Visit Report for 11/02/2021 Arrival Information Details Patient Name: Date of Service: April, [name] 11/02/2021 10:15 A M Medical Record Number: [id number] Patient Account Number: [id number] Date of Birth/Sex: Treating RN: 12/20/1945 (77 y.o. Female) April [name] Primary Care April [name]: April [name] Other Clinician: Referring April [name]: Treating April [name]/Extender: April [name]: 6 Visit Information History Since Last Visit Added or deleted any medications: No Patient Arrived: Ambulatory Any new allergies or adverse reactions: No Arrival Time: 10:08 Had a fall or experienced change in No Accompanied By: spouse activities of daily living that may affect Transfer Assistance: None risk of falls: Patient Identification Verified: Yes Signs or symptoms of abuse/neglect since last visito No Patient Requires Transmission-Based Precautions: No Hospitalized since last visit: No Patient Has Alerts: No Implantable device outside of the clinic excluding No cellular tissue based products placed in the center since last visit: Has Dressing in Place as Prescribed: Yes Has Compression in Place as Prescribed: Yes Pain Present Now: No Electronic Signature(s) Signed: 11/02/2021 11:04:44 AM By: April Catholic RN Entered By: April [name] on 11/02/2021 10:13:26 -------------------------------------------------------------------------------- Compression Therapy Details Patient Name: Date of Service: April Austin [name]. 11/02/2021 10:15 A M Medical Record Number: [id number] Patient Account Number: [id number] Date of Birth/Sex: Treating RN: December 20, 1945 (76 y.o. Female) April [name] Primary Care Tavarus Poteete: April [name] Other Clinician: Referring April [name]: Treating Janace Decker/Extender: April [name]: 6 Compression Therapy  Performed for Wound Assessment: Wound #2 Right,Lateral Lower Leg Performed By: Clinician April Catholic, RN Compression Type: Three Layer Post Procedure Diagnosis Same as Pre-procedure Electronic Signature(s) Signed: 11/02/2021 11:04:44 AM By: April Catholic RN Entered By: April [name] on 11/02/2021 10:40:19 April [name], April [name] ([id number]) [id number] Page 2 of 8 -------------------------------------------------------------------------------- Encounter Discharge Information Details Patient Name: Date of Service: April [name] 11/02/2021 10:15 A M Medical Record Number: [id number] Patient Account Number: [id number] Date of Birth/Sex: Treating RN: 1945-11-01 (76 y.o. Female) April [name] Primary Care April [name]: April [name] Other Clinician: Referring April [name]: Treating April [name]/Extender: April [name]: 6 Encounter Discharge Information Items Post Procedure Vitals Discharge Condition: Stable Temperature (F): 97.7 Ambulatory Status: Ambulatory Pulse (bpm): 80 Discharge Destination: Home Respiratory Rate (breaths/min): 18 Transportation: Private Auto Blood Pressure (mmHg): 132/78 Accompanied By: self Schedule Follow-up Appointment: Yes Clinical Summary of Care: Patient Declined Electronic Signature(s) Signed: 11/02/2021 11:04:44 AM By: April Catholic RN Entered By: April [name] on 11/02/2021 11:04:14 -------------------------------------------------------------------------------- Lower Extremity Assessment Details Patient Name: Date of Service: April [name] [name]. 11/02/2021 10:15 A M Medical Record Number: [id number] Patient Account Number: [id number] Date of Birth/Sex: Treating RN: 1945-11-30 (76 y.o. Female) April [name] Primary Care April [name]: April [name] Other Clinician: Referring April [name]: Treating April [name]: April [name]: 6 Edema  Assessment Assessed: [Left: No] [Right: No] Edema: [Left: Ye] [Right: s] Calf Left: Right: Point of Measurement: From Medial Instep 28.2 cm Ankle Left: Right: Point of Measurement: From Medial Instep 19 cm Vascular Assessment Pulses: Dorsalis Pedis Palpable: [Right:Yes] Electronic Signature(s) Signed: 11/02/2021 11:04:44 AM By: April Catholic RN Entered By: April [name] on 11/02/2021 10:27:41 Multi Wound Chart Details -------------------------------------------------------------------------------- April [name] ([id number]) [id number] Page 3 of 8 Patient Name: Date of Service: April [name] 11/02/2021 10:15 A M Medical Record Number: [id number] Patient Account Number: [id number] Date of Birth/Sex: Treating RN: Aug 21, 1945 (76 y.o. Female) Primary Care April [name]:  April [name] Other Clinician: Referring April [name]: Treating April [name]/Extender: April [name]: 6 Vital Signs Height(in): 60 Pulse(bpm): 80 Weight(lbs): 101 Blood Pressure(mmHg): 132/78 Body Mass Index(BMI): 19.7 Temperature(F): 97.7 Respiratory Rate(breaths/min): 18 Wound Assessments Wound Number: 2 2 N/A Photos: No Photos No Photos N/A Right, Lateral Lower Leg Right, Lateral Lower Leg N/A Wound Location: Surgical Injury Surgical Injury N/A Wounding Event: Open Surgical Wound Open Surgical Wound N/A Primary Etiology: Venous Leg Ulcer Venous Leg Ulcer N/A Secondary Etiology: Cataracts, Chronic sinus Cataracts, Chronic sinus N/A Comorbid History: problems/congestion, Hypertension, problems/congestion, Hypertension, Crohns, Osteoarthritis Crohns, Osteoarthritis 08/17/2021 08/17/2021 N/A Date Acquired: 6 6 N/A Weeks of [name]: Open Open N/A Wound Status: No No N/A Wound Recurrence: Yes Yes N/A Clustered Wound: 2x0.6x0.1 2x0.6x0.1 N/A Measurements L x W x D (cm) 0.942 0.942 N/A A (cm) : rea 0.094 0.094 N/A Volume  (cm) : 94.80% 94.80% N/A % Reduction in Area: 94.80% 94.80% N/A % Reduction in Volume: Full Thickness Without Exposed Full Thickness Without Exposed N/A Classification: Support Structures Support Structures Medium Medium N/A Exudate A mount: Serosanguineous Serosanguineous N/A Exudate Type: red, brown red, brown N/A Exudate Color: Distinct, outline attached Distinct, outline attached N/A Wound Margin: Large (67-100%) Large (67-100%) N/A Granulation Amount: Red Red N/A Granulation Quality: Small (1-33%) Small (1-33%) N/A Necrotic Amount: Adherent Slough Eschar, Adherent Slough N/A Necrotic Tissue: Fascia: No Fascia: No N/A Exposed Structures: Fat Layer (Subcutaneous Tissue): No Fat Layer (Subcutaneous Tissue): No Tendon: No Tendon: No Muscle: No Muscle: No Joint: No Joint: No Bone: No Bone: No Small (1-33%) Small (1-33%) N/A Epithelialization: Excoriation: No Excoriation: No N/A Periwound Skin Texture: Induration: No Induration: No Callus: No Callus: No Crepitus: No Crepitus: No Rash: No Rash: No Scarring: No Scarring: No Dry/Scaly: No Dry/Scaly: No N/A Periwound Skin Moisture: Atrophie Blanche: No Atrophie Blanche: No N/A Periwound Skin Color: Cyanosis: No Cyanosis: No Ecchymosis: No Ecchymosis: No Erythema: No Erythema: No Hemosiderin Staining: No Hemosiderin Staining: No Mottled: No Mottled: No Pallor: No Pallor: No Rubor: No Rubor: No No Abnormality No Abnormality N/A Temperature: [name] Notes Electronic Signature(s) Signed: 11/02/2021 10:39:40 AM By: [name] MD FACS Entered By: [name] on 11/02/2021 10:39:39 Guadamuz, April [name] ([id number]) [id number] Page 4 of 8 -------------------------------------------------------------------------------- Multi-Disciplinary Care Plan Details Patient Name: Date of Service: April, [name] 11/02/2021 10:15 A M Medical Record Number:  [id number] Patient Account Number: [id number] Date of Birth/Sex: Treating RN: [birth year] (76 y.o. Female) April [name] Primary Care Jonael Paradiso: April [name] Other Clinician: Referring Darcie Mellone: Treating Shameeka Silliman/Extender: Caprice Kluver in [name]: [place] reviewed with physician Active Inactive Wound/Skin Impairment Nursing Diagnoses: Impaired tissue integrity Knowledge deficit related to ulceration/compromised skin integrity Goals: Patient/caregiver will verbalize understanding of skin care regimen Date Initiated: 09/20/2021 Target Resolution Date: 12/06/2021 Goal Status: Active Ulcer/skin breakdown will have a volume reduction of 30% by week 4 Date Initiated: 09/20/2021 Target Resolution Date: 12/06/2021 Goal Status: Active Interventions: Assess patient/caregiver ability to obtain necessary supplies Assess patient/caregiver ability to perform ulcer/skin care regimen upon admission and as needed Assess ulceration(s) every visit Provide education on ulcer and skin care [name] Activities: Skin care regimen initiated : 09/20/2021 Topical wound management initiated : 09/20/2021 Notes: Electronic Signature(s) Signed: 11/02/2021 11:04:44 AM By: April Catholic RN Entered By: April [name] on 11/02/2021 11:03:07 -------------------------------------------------------------------------------- Pain Assessment Details Patient Name: Date of Service: April Austin [name]. 11/02/2021 10:15 A M Medical Record Number: [id number] Patient Account Number: [id number] Date  of Birth/Sex: Treating RN: 12-01-1945 (76 y.o. Female) April [name] Primary Care Kiyah Demartini: April [name] Other Clinician: Referring Bertin Inabinet: Treating Ladaja Yusupov/Extender: April [name]: 6 Active Problems Location of Pain Severity and Description of Pain Patient Has Paino No Site Locations April [name], April [name] ([id number])  [id number] Page 5 of 8 Pain Management and Medication Current Pain Management: Electronic Signature(s) Signed: 11/02/2021 11:04:44 AM By: April Catholic RN Entered By: April [name] on 11/02/2021 10:19:04 -------------------------------------------------------------------------------- Patient/Caregiver Education Details Patient Name: Date of Service: April Austin [name]. 10/27/2023andnbsp10:15 A M Medical Record Number: [id number] Patient Account Number: [id number] Date of Birth/Gender: Treating RN: 1945/10/02 (76 y.o. Female) April [name] Primary Care Physician: April [name] Other Clinician: Referring Physician: Treating Physician/Extender: Caprice Kluver in [name]: 6 Education Assessment Education Provided To: Patient Education Topics Provided Wound/Skin Impairment: Methods: Explain/Verbal Responses: Return demonstration correctly Electronic Signature(s) Signed: 11/02/2021 11:04:44 AM By: April Catholic RN Entered By: April [name] on 11/02/2021 11:03:22 -------------------------------------------------------------------------------- Wound Assessment Details Patient Name: Date of Service: April Austin [name]. 11/02/2021 10:15 A M Medical Record Number: [id number] Patient Account Number: [id number] Date of Birth/Sex: Treating RN: June 14, 1945 (76 y.o. Female) April [name] Primary Care Arkeem Harts: April [name] Other Clinician: Referring Coyle Stordahl: Treating Argelio Granier/Extender: Layza, Summa [name] ([id number]) [id number] Page 6 of 8 Weeks in [name]: 6 Wound Status Wound Number: 2 Primary Open Surgical Wound Etiology: Wound Location: Right, Lateral Lower Leg Secondary Venous Leg Ulcer Wounding Event: Surgical Injury Etiology: Date Acquired: 08/17/2021 Wound Status: Open Weeks Of [name]: 6 Comorbid Cataracts, Chronic sinus  problems/congestion, Hypertension, Clustered Wound: Yes History: Crohns, Osteoarthritis Wound Measurements Length: (cm) 2 Width: (cm) 0.6 Depth: (cm) 0.1 Area: (cm) 0.942 Volume: (cm) 0.094 % Reduction in Area: 94.8% % Reduction in Volume: 94.8% Epithelialization: Small (1-33%) Tunneling: No Undermining: No Wound Description Classification: Full Thickness Without Exposed Support Structures Wound Margin: Distinct, outline attached Exudate Amount: Medium Exudate Type: Serosanguineous Exudate Color: red, brown Foul Odor After Cleansing: No Slough/Fibrino No Wound Bed Granulation Amount: Large (67-100%) Exposed Structure Granulation Quality: Red Fascia Exposed: No Necrotic Amount: Small (1-33%) Fat Layer (Subcutaneous Tissue) Exposed: No Necrotic Quality: Adherent Slough Tendon Exposed: No Muscle Exposed: No Joint Exposed: No Bone Exposed: No Periwound Skin Texture Texture Color No Abnormalities Noted: Yes No Abnormalities Noted: Yes Moisture Temperature / Pain No Abnormalities Noted: Yes Temperature: No Abnormality Electronic Signature(s) Signed: 11/02/2021 11:04:44 AM By: April Catholic RN Entered By: April [name] on 11/02/2021 10:32:25 -------------------------------------------------------------------------------- Wound Assessment Details Patient Name: Date of Service: April Austin [name]. 11/02/2021 10:15 A M Medical Record Number: [id number] Patient Account Number: [id number] Date of Birth/Sex: Treating RN: 12/21/1945 (76 y.o. Female) April [name] Primary Care April [name]: April [name] Other Clinician: Referring April [name]: Treating Terri Malerba/Extender: April [name]: 6 Wound Status Wound Number: 2 Primary Open Surgical Wound Etiology: Wound Location: Right, Lateral Lower Leg Secondary Venous Leg Ulcer Wounding Event: Surgical Injury Etiology: Date Acquired: 08/17/2021 Wound Status: Open Weeks Of [name]:  6 Comorbid Cataracts, Chronic sinus problems/congestion, Hypertension, Clustered Wound: Yes History: Crohns, Osteoarthritis Wound Measurements Length: (cm) 2 Width: (cm) 0.6 Bullard, April [name] ([id number]) Depth: (cm) 0.1 Area: (cm) 0.942 Volume: (cm) 0.094 % Reduction in Area: 94.8% % Reduction in Volume: 94.8% [id number] Page 7 of 8 Epithelialization: Small (1-33%) Tunneling: No Undermining: No Wound Description Classification: Full Thickness Without Exposed Support Structures Wound Margin: Distinct, outline attached Exudate Amount: Medium Exudate  Type: Serosanguineous Exudate Color: red, brown Foul Odor After Cleansing: No Slough/Fibrino No Wound Bed Granulation Amount: Large (67-100%) Exposed Structure Granulation Quality: Red Fascia Exposed: No Necrotic Amount: Small (1-33%) Fat Layer (Subcutaneous Tissue) Exposed: No Necrotic Quality: Eschar, Adherent Slough Tendon Exposed: No Muscle Exposed: No Joint Exposed: No Bone Exposed: No Periwound Skin Texture Texture Color No Abnormalities Noted: Yes No Abnormalities Noted: Yes Moisture Temperature / Pain No Abnormalities Noted: Yes Temperature: No Abnormality [name] Notes Wound #2 (Lower Leg) Wound Laterality: Right, Lateral Cleanser Peri-Wound Care Zinc Oxide Ointment 30g tube Discharge Instruction: Apply Zinc Oxide to periwound with each dressing change Sween Lotion (Moisturizing lotion) Discharge Instruction: Apply moisturizing lotion as directed Topical Primary Dressing Hydrofera Blue Ready Foam, 2.5 x2.5 in Discharge Instruction: Apply to wound bed as instructed Secondary Dressing Woven Gauze Sponge, Non-Sterile 4x4 in Discharge Instruction: Apply over primary dressing as directed. Secured With Compression Wrap ThreePress (3 layer compression wrap) Discharge Instruction: Apply three layer compression as directed. Compression Stockings Add-Ons Electronic  Signature(s) Signed: 11/02/2021 11:04:44 AM By: April Catholic RN Entered By: April [name] on 11/02/2021 10:37:19 -------------------------------------------------------------------------------- Vitals Details Patient Name: Date of Service: April Austin [name]. 11/02/2021 10:15 A M Medical Record Number: [id number] Patient Account Number: [id number] Date of Birth/Sex: Treating RN: [birth year] (76 y.o. Female) April [name], April [name] ([id number]) [id number] Page 8 of 8 Primary Care Dorie Ohms: April [name] Other Clinician: Referring Delfino Friesen: Treating Samani Deal/Extender: April [name]: 6 Vital Signs Time Taken: 10:10 Temperature (F): 97.7 Height (in): 60 Pulse (bpm): 80 Weight (lbs): 101 Respiratory Rate (breaths/min): 18 Body Mass Index (BMI): 19.7 Blood Pressure (mmHg): 132/78 Reference Range: 80 - 120 mg / dl Electronic Signature(s) Signed: 11/02/2021 11:04:44 AM By: April Catholic RN Entered By: April [name] on 11/02/2021 10:18:58

## 2021-11-02 NOTE — Progress Notes (Addendum)
[name], [name] ([id number]) [id number] Page 1 of 10 Visit Report for 11/02/2021 Chief Complaint Document Details Patient Name: Date of Service: MACEE, [name] 11/02/2021 10:15 A M Medical Record Number: [id number] Patient Account Number: [id number] Date of Birth/Sex: Treating RN: 20-Sep-1945 (76 y.o. F) Primary Care Provider: Domingo Mend Other Clinician: Referring Provider: Treating Provider/Extender: [name] in Treatment: 6 Information Obtained from: Patient Chief Complaint 11/15/2020; patient is here for review of a wound area on the right posterior calf 09/20/2021: Patient is here for a nonhealing wound after undergoing Mohs surgery on her right lower leg. Electronic Signature(s) Signed: 11/02/2021 10:40:06 AM By: [name] MD FACS Entered By: [name] on 11/02/2021 10:40:06 -------------------------------------------------------------------------------- Debridement Details Patient Name: Date of Service: [name]. 11/02/2021 10:15 A M Medical Record Number: [id number] Patient Account Number: [id number] Date of Birth/Sex: Treating RN: 06-Aug-1945 (76 y.o. [name] Primary Care Provider: Domingo Mend Other Clinician: Referring Provider: Treating Provider/Extender: [name] Weeks in Treatment: 6 Debridement Performed for Assessment: Wound #2 Right,Lateral Lower Leg Performed By: Physician [name], MD Debridement Type: Debridement Severity of Tissue Pre Debridement: Fat layer exposed Level of Consciousness (Pre-procedure): Awake and Alert Pre-procedure Verification/Time Out Yes - 10:36 Taken: Start Time: 10:36 Pain Control: Lidocaine 5% topical ointment T Area Debrided (L x W): otal 2 (cm) x 0.6 (cm) = 1.2 (cm) Tissue and other material debrided: Non-Viable, Eschar Level: Non-Viable Tissue Debridement Description: Selective/Open  Wound Instrument: Curette Bleeding: Minimum Hemostasis Achieved: Pressure End Time: 10:37 Procedural Pain: 0 Post Procedural Pain: 0 Response to Treatment: Procedure was tolerated well Level of Consciousness (Post- Awake and Alert procedure): Post Debridement Measurements of Total Wound Length: (cm) 2 Width: (cm) 0.6 Depth: (cm) 0.1 Volume: (cm) 0.094 [name], [name] ([id number]) [id number] Page 2 of 10 Character of Wound/Ulcer Post Debridement: Improved Severity of Tissue Post Debridement: Fat layer exposed Post Procedure Diagnosis Same as Pre-procedure Notes Scribed for Dr. [name] by J.Scotton Electronic Signature(s) Signed: 11/02/2021 11:04:44 AM By: Dellie Catholic RN Signed: 11/02/2021 12:04:20 PM By: [name] MD FACS Entered By: Dellie Catholic on 11/02/2021 10:40:02 -------------------------------------------------------------------------------- HPI Details Patient Name: Date of Service: [name]. 11/02/2021 10:15 A M Medical Record Number: [id number] Patient Account Number: [id number] Date of Birth/Sex: Treating RN: 06-27-1945 (76 y.o. F) Primary Care Provider: Domingo Mend Other Clinician: Referring Provider: Treating Provider/Extender: [name] Weeks in Treatment: 6 History of Present Illness HPI Description: ADMISSION 11/15/2020 This is a 76 year old woman with a history of squamous cell cancer skin cancer followed by Dr. Anabel Bene dermatology. She describes having a wound on the right posterior calf dating back since September. She saw her primary doctor in early October who suggested petroleum infused gauze. The patient has been doing this. No real improvement in fact she says this is getting larger. She does not complain much of pain. She is completely uncertain about how this happened there was no trauma she simply became aware of it at some point describing it is about the size of a  quarter but it has been getting progressively larger since then. She is here for review of this. Past medical history Crohn's disease not currently on prednisone, gastroesophageal reflux disease, history of squamous cell skin cancer, history of rheumatoid arthritis, hypertension 11/16; this lady had a large superficial atypical wound on the right posterior calf. She has significant solar skin damage probably some degree of  venous insufficiency but she described a rapidly expanding wound area. Because of this and a history of skin cancer [name] went ahead and did a shave biopsy but we do not have the results of this at the time we saw her in clinic today. She has been using silver alginate and kerlix 11/30; shave biopsy [name] did last time did not show any malignancy or fungal elements. Suggestion of a stasis ulcer. With our treatment which is simply been silver alginate with kerlix that she is changing daily the wound is down dramatically almost surprising how rapidly it is reduced in surface area 12/14; patient comes in today with her wound healed this was on the right posterior calf. She has significant solar skin damage probably some degree of venous insufficiency however she described a rapidly expanding wound area on admission. Because of this and a history of skin cancer [name] did do a shave biopsy but nothing was really found of worry. 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 Unfortunately, the wound has failed to heal. There is thick slough on the wound surface. The periwound skin is in reasonably good condition. There is granulation tissue forming underneath the slough. 09/28/2021: The wound has deteriorated rather significantly over the past week. She  had fairly copious drainage that she noticed as early as Monday, but she did not contact our office. Today there is a lot of periwound skin breakdown and a very pungent odor coming from the wound. There is thick slough on the wound surface but there is still good granulation tissue underlying this. Despite this, however, the wound actually measures smaller. 10/05/2021: The culture that [name] took grew out MRSA. [name] prescribed doxycycline and she is still taking this. There has been a tremendous improvement in the wound. It is smaller and has contracted considerably. There is hypertrophic granulation tissue present. No malodorous drainage. 10/16/2021: The wound has contracted further and is epithelializing. The surface is very clean and the hypertrophic granulation tissue has not recurred. She completed her course of doxycycline. 10/19; surgical wound on the right medial lower extremity secondary to Mohs surgery done in August. She is using Hydrofera Blue under 3 layer compression and making excellent progress. 11/02/2021: The wound is nearly completely epithelialized. Just a couple of open areas with some eschar overlying the surface. Electronic Signature(s) Signed: 11/02/2021 10:40:47 AM By: [name] MD FACS Entered By: [name] on 11/02/2021 10:40:47 [name], [name] ([id number]) [id number] Page 3 of 10 -------------------------------------------------------------------------------- Physical Exam Details Patient Name: Date of Service: [name], [name] 11/02/2021 10:15 A M Medical Record Number: [id number] Patient Account Number: [id number] Date of Birth/Sex: Treating RN: 05-04-1945 (76 y.o. F) Primary Care Provider: Domingo Mend Other Clinician: Referring Provider: Treating Provider/Extender: [name] , [name] Weeks in Treatment: 6 Constitutional . . . . No acute distress.Marland Kitchen Respiratory Normal work of breathing on room  air.. Notes 11/02/2021: The wound is nearly completely epithelialized. Just a couple of open areas with some eschar overlying the surface. Electronic Signature(s) Signed: 11/02/2021 10:41:15 AM By: [name] MD FACS Entered By: [name] on 11/02/2021 10:41:15 -------------------------------------------------------------------------------- Physician Orders Details Patient Name: Date of Service: [name]. 11/02/2021 10:15 A M Medical Record Number: [id number] Patient Account Number:  [id number] Date of Birth/Sex: Treating RN: 17-Oct-1945 (75 y.o. [name] Primary Care Provider: Domingo Mend Other Clinician: Referring Provider: Treating Provider/Extender: [name] Weeks in Treatment: 6 Verbal / Phone Orders: No Diagnosis Coding ICD-10 Coding Code Description [phone] 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d T81.31XS Disruption of external operation (surgical) wound, not elsewhere classified, sequela I10 Essential (primary) hypertension C44.722 Squamous cell carcinoma of skin of right lower limb, including hip K50.90 Crohn's disease, unspecified, without complications L89 Unspecified severe protein-calorie malnutrition Follow-up Appointments ppointment in 1 week. - Dr. [name] RM 1 Return A Anesthetic Wound #2 Right,Lateral Lower Leg (In clinic) Topical Lidocaine 4% applied to wound bed Bathing/ Shower/ Hygiene May shower with protection but do not get wound dressing(s) wet. Edema Control - Lymphedema / SCD / Other Elevate legs to the level of the heart or above for 30 minutes daily and/or when sitting, a frequency of: Avoid standing for long periods of time. ARAYA, [name] [name] ([id number]) [id number] Page 4 of 10 Exercise regularly Wound Treatment Wound #2 - Lower Leg Wound Laterality: Right, Lateral Peri-Wound Care: Zinc Oxide Ointment 30g tube 1 x  Per Week/30 Days Discharge Instructions: Apply Zinc Oxide to periwound with each dressing change Peri-Wound Care: Sween Lotion (Moisturizing lotion) 1 x Per Week/30 Days Discharge Instructions: Apply moisturizing lotion as directed Prim Dressing: Hydrofera Blue Ready Foam, 2.5 x2.5 in 1 x Per Week/30 Days ary Discharge Instructions: Apply to wound bed as instructed Secondary Dressing: Woven Gauze Sponge, Non-Sterile 4x4 in 1 x Per Week/30 Days Discharge Instructions: Apply over primary dressing as directed. Compression Wrap: ThreePress (3 layer compression wrap) 1 x Per Week/30 Days Discharge Instructions: Apply three layer compression as directed. Electronic Signature(s) Signed: 11/02/2021 12:04:20 PM By: [name] MD FACS Entered By: [name] on 11/02/2021 10:41:32 -------------------------------------------------------------------------------- Problem List Details Patient Name: Date of Service: [name]. 11/02/2021 10:15 A M Medical Record Number: [id number] Patient Account Number: [id number] Date of Birth/Sex: Treating RN: 1945-05-07 (76 y.o. F) Primary Care Provider: Domingo Mend Other Clinician: Referring Provider: Treating Provider/Extender: [name] Weeks in Treatment: 6 Active Problems ICD-10 Encounter Code Description Active Date MDM Diagnosis L97.812 Non-pressure chronic ulcer of other part of right lower leg with fat layer 09/20/2021 No Yes exposed T81.31XS Disruption of external operation (surgical) wound, not elsewhere classified, 09/20/2021 No Yes sequela I10 Essential (primary) hypertension 09/20/2021 No Yes C44.722 Squamous cell carcinoma of skin of right lower limb, including hip 09/20/2021 No Yes K50.90 Crohn's disease, unspecified, without complications 5/59/7416 No Yes E43 Unspecified severe protein-calorie malnutrition 09/20/2021 No Yes Inactive Problems Roeper, Jazmin [name] ([id number])  [id number] Page 5 of 10 Resolved Problems Electronic Signature(s) Signed: 11/02/2021 10:39:17 AM By: [name] MD FACS Previous Signature: 11/02/2021 10:15:21 AM Version By: [name] MD FACS Entered By: [name] on 11/02/2021 10:39:17 -------------------------------------------------------------------------------- Progress Note Details Patient Name: Date of Service: [name]. 11/02/2021 10:15 A M Medical Record Number: [id number] Patient Account Number: [id number] Date of Birth/Sex: Treating RN: 06-20-1945 (76 y.o. F) Primary Care Provider: Domingo Mend Other Clinician: Referring Provider: Treating Provider/Extender: [name] in Treatment: 6 Subjective Chief Complaint Information obtained from Patient 11/15/2020; patient is here for review of a wound area on the right posterior calf 09/20/2021: Patient is here for a nonhealing wound after undergoing Mohs surgery on her right lower leg. History of Present Illness (HPI)  ADMISSION 11/15/2020 This is a 76 year old woman with a history of squamous cell cancer skin cancer followed by Dr. Anabel Bene dermatology. She describes having a wound on the right posterior calf dating back since September. She saw her primary doctor in early October who suggested petroleum infused gauze. The patient has been doing this. No real improvement in fact she says this is getting larger. She does not complain much of pain. She is completely uncertain about how this happened there was no trauma she simply became aware of it at some point describing it is about the size of a quarter but it has been getting progressively larger since then. She is here for review of this. Past medical history Crohn's disease not currently on prednisone, gastroesophageal reflux disease, history of squamous cell skin cancer, history of rheumatoid arthritis, hypertension 11/16; this lady  had a large superficial atypical wound on the right posterior calf. She has significant solar skin damage probably some degree of venous insufficiency but she described a rapidly expanding wound area. Because of this and a history of skin cancer [name] went ahead and did a shave biopsy but we do not have the results of this at the time we saw her in clinic today. She has been using silver alginate and kerlix 11/30; shave biopsy [name] did last time did not show any malignancy or fungal elements. Suggestion of a stasis ulcer. With our treatment which is simply been silver alginate with kerlix that she is changing daily the wound is down dramatically almost surprising how rapidly it is reduced in surface area 12/14; patient comes in today with her wound healed this was on the right posterior calf. She has significant solar skin damage probably some degree of venous insufficiency however she described a rapidly expanding wound area on admission. Because of this and a history of skin cancer [name] did do a shave biopsy but nothing was really found of worry. 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 Unfortunately, the wound has failed to heal. There is thick slough on the wound surface. The periwound skin is in reasonably good condition. There is granulation tissue forming underneath the slough. 09/28/2021: The wound has deteriorated rather significantly over the past week. She had fairly copious drainage that she noticed as early as Monday, but she did not contact our office. Today there is a lot of periwound skin breakdown and a very pungent odor coming from the wound. There is thick slough on the wound surface but there is still good granulation tissue underlying this.  Despite this, however, the wound actually measures smaller. 10/05/2021: The culture that [name] took grew out MRSA. [name] prescribed doxycycline and she is still taking this. There has been a tremendous improvement in the wound. It is smaller and has contracted considerably. There is hypertrophic granulation tissue present. No malodorous drainage. 10/16/2021: The wound has contracted further and is epithelializing. The surface is very clean and the hypertrophic granulation tissue has not recurred. She completed her course of doxycycline. 10/19; surgical wound on the right medial lower extremity secondary to Mohs surgery done in August. She is using Hydrofera Blue under 3 layer compression and making excellent progress. 11/02/2021: The wound is nearly completely epithelialized. Just a couple of open areas with some eschar  overlying the surface. Patient History Information obtained from Patient. Family History Diabetes - Father, Heart Disease - Father, Hypertension - Father, No family history of Cancer, Hereditary Spherocytosis, Kidney Disease, Lung Disease, Seizures, Stroke, Thyroid Problems, Tuberculosis. Social History FALESHA, SCHOMMER [name] ([id number]) [id number] Page 6 of 10 Former smoker - quit 30 years ago, Marital Status - Married, Alcohol Use - Never, Drug Use - No History, Caffeine Use - Rarely. Medical History Eyes Patient has history of Cataracts - Had cataract surgery Denies history of Glaucoma, Optic Neuritis Ear/Nose/Mouth/Throat Patient has history of Chronic sinus problems/congestion - Post Nasal drip Denies history of Middle ear problems Hematologic/Lymphatic Denies history of Anemia, Hemophilia, Human Immunodeficiency Virus, Lymphedema, Sickle Cell Disease Respiratory Denies history of Aspiration, Asthma, Chronic Obstructive Pulmonary Disease (COPD), Pneumothorax, Sleep Apnea, Tuberculosis Cardiovascular Patient has history of  Hypertension Gastrointestinal Patient has history of Crohnoos - Has Chrons Disease Denies history of Cirrhosis , Colitis, Hepatitis A, Hepatitis B, Hepatitis C Endocrine Denies history of Type [name] Diabetes, Type II Diabetes Genitourinary Denies history of End Stage Renal Disease Immunological Denies history of Lupus Erythematosus, Raynaudoos, Scleroderma Musculoskeletal Patient has history of Osteoarthritis - Knees Denies history of Gout, Rheumatoid Arthritis, Osteomyelitis Neurologic Denies history of Dementia, Neuropathy, Quadriplegia, Paraplegia, Seizure Disorder Oncologic Denies history of Received Chemotherapy, Received Radiation Psychiatric Denies history of Anorexia/bulimia, Confinement Anxiety Hospitalization/Surgery History - bil TKA. - Moh's procedure right leg 08/17/21. - bil cataract extractions. - nasal fx surgery. Medical A Surgical History Notes nd Gastrointestinal Has a large hiatal hernia. Has Chrons Disease., GERD, pancreatic insufficiency Endocrine Pancreatic Insufficiency Genitourinary stage 3 Chronic Kidney disease Integumentary (Skin) Had Squamous Cell (skin cancer). Had a skin graft on Right knee (area) Musculoskeletal Had Bil knee replacements, hx cervical spine fx, sternal fx, thoracis spine fx Neurologic left foot drop Oncologic Had Squamous Cell (skin cell) Objective Constitutional No acute distress.. Vitals Time Taken: 10:10 AM, Height: 60 in, Weight: 101 lbs, BMI: 19.7, Temperature: 97.7 F, Pulse: 80 bpm, Respiratory Rate: 18 breaths/min, Blood Pressure: 132/78 mmHg. Respiratory Normal work of breathing on room air.. General Notes: 11/02/2021: The wound is nearly completely epithelialized. Just a couple of open areas with some eschar overlying the surface. Integumentary (Hair, Skin) Wound #2 status is Open. Original cause of wound was Surgical Injury. The date acquired was: 08/17/2021. The wound has been in treatment 6 weeks. The wound is  located on the Right,Lateral Lower Leg. The wound measures 2cm length x 0.6cm width x 0.1cm depth; 0.942cm^2 area and 0.094cm^3 volume. There is no tunneling or undermining noted. There is a medium amount of serosanguineous drainage noted. The wound margin is distinct with the outline attached to the wound base. There is large (67-100%) red granulation within the wound bed. There is a small (1-33%) amount of necrotic tissue within the wound bed including Adherent Slough. The periwound skin appearance had no abnormalities noted for texture. The periwound skin appearance had no abnormalities noted for moisture. The periwound skin appearance had no abnormalities noted for color. Periwound temperature was noted as No Abnormality. Wound #2 status is Open. Original cause of wound was Surgical Injury. The date acquired was: 08/17/2021. The wound has been in treatment 6 weeks. The wound is located on the Right,Lateral Lower Leg. The wound measures 2cm length x 0.6cm width x 0.1cm depth; 0.942cm^2 area and 0.094cm^3 volume. There is no tunneling or undermining noted. There is a medium amount of serosanguineous drainage noted. The wound margin is distinct with the outline attached to the  wound base. There is large (67-100%) red granulation within the wound bed. There is a small (1-33%) amount of necrotic tissue within the wound bed including Eschar and Adherent Slough. The periwound skin appearance had no abnormalities noted for texture. The periwound skin appearance had no abnormalities noted for moisture. The periwound skin appearance had no abnormalities noted for color. Periwound temperature was noted as No Abnormality. [name], [name] ([id number]) [id number] Page 7 of 10 Assessment Active Problems ICD-10 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d Disruption of external operation (surgical) wound, not elsewhere classified,  sequela Essential (primary) hypertension Squamous cell carcinoma of skin of right lower limb, including hip Crohn's disease, unspecified, without complications Unspecified severe protein-calorie malnutrition Procedures Wound #2 Pre-procedure diagnosis of Wound #2 is an Open Surgical Wound located on the Right,Lateral Lower Leg .Severity of Tissue Pre Debridement is: Fat layer exposed. There was a Selective/Open Wound Non-Viable Tissue Debridement with a total area of 1.2 sq cm performed by [name], MD. With the following instrument(s): Curette to remove Non-Viable tissue/material. Material removed includes Eschar after achieving pain control using Lidocaine 5% topical ointment. No specimens were taken. A time out was conducted at 10:36, prior to the start of the procedure. A Minimum amount of bleeding was controlled with Pressure. The procedure was tolerated well with a pain level of 0 throughout and a pain level of 0 following the procedure. Post Debridement Measurements: 2cm length x 0.6cm width x 0.1cm depth; 0.094cm^3 volume. Character of Wound/Ulcer Post Debridement is improved. Severity of Tissue Post Debridement is: Fat layer exposed. Post procedure Diagnosis Wound #2: Same as Pre-Procedure General Notes: Scribed for Dr. [name] by J.Scotton. Pre-procedure diagnosis of Wound #2 is an Open Surgical Wound located on the Right,Lateral Lower Leg . There was a Three Layer Compression Therapy Procedure by Dellie Catholic, RN. Post procedure Diagnosis Wound #2: Same as Pre-Procedure Plan Follow-up Appointments: Return Appointment in 1 week. - Dr. [name] RM 1 Anesthetic: Wound #2 Right,Lateral Lower Leg: (In clinic) Topical Lidocaine 4% applied to wound bed Bathing/ Shower/ Hygiene: May shower with protection but do not get wound dressing(s) wet. Edema Control - Lymphedema / SCD / Other: Elevate legs to the level of the heart or above for 30 minutes daily and/or when sitting, a  frequency of: Avoid standing for long periods of time. Exercise regularly WOUND #2: - Lower Leg Wound Laterality: Right, Lateral Peri-Wound Care: Zinc Oxide Ointment 30g tube 1 x Per Week/30 Days Discharge Instructions: Apply Zinc Oxide to periwound with each dressing change Peri-Wound Care: Sween Lotion (Moisturizing lotion) 1 x Per Week/30 Days Discharge Instructions: Apply moisturizing lotion as directed Prim Dressing: Hydrofera Blue Ready Foam, 2.5 x2.5 in 1 x Per Week/30 Days ary Discharge Instructions: Apply to wound bed as instructed Secondary Dressing: Woven Gauze Sponge, Non-Sterile 4x4 in 1 x Per Week/30 Days Discharge Instructions: Apply over primary dressing as directed. Com pression Wrap: ThreePress (3 layer compression wrap) 1 x Per Week/30 Days Discharge Instructions: Apply three layer compression as directed. 11/02/2021: The wound is nearly completely epithelialized. Just a couple of open areas with some eschar overlying the surface. [name] used a curette to debride the eschar from her wound surface. We will continue Hydrofera Blue with 3 layer compression. Follow-up in 1 week, at which time [name] anticipate she will likely be healed. Electronic Signature(s) Signed: 11/02/2021 10:41:53 AM By: [name] MD FACS Entered By: [name] on 11/02/2021 10:41:53 Sroka, [name] ([id number])  [id number] Page 8 of 10 -------------------------------------------------------------------------------- HxROS Details Patient Name: Date of Service: [name], [name] 11/02/2021 10:15 A M Medical Record Number: [id number] Patient Account Number: [id number] Date of Birth/Sex: Treating RN: 07-Jan-1946 (76 y.o. F) Primary Care Provider: Domingo Mend Other Clinician: Referring Provider: Treating Provider/Extender: [name] in Treatment: 6 Information Obtained From Patient Eyes Medical History: Positive for: Cataracts -  Had cataract surgery Negative for: Glaucoma; Optic Neuritis Ear/Nose/Mouth/Throat Medical History: Positive for: Chronic sinus problems/congestion - Post Nasal drip Negative for: Middle ear problems Hematologic/Lymphatic Medical History: Negative for: Anemia; Hemophilia; Human Immunodeficiency Virus; Lymphedema; Sickle Cell Disease Respiratory Medical History: Negative for: Aspiration; Asthma; Chronic Obstructive Pulmonary Disease (COPD); Pneumothorax; Sleep Apnea; Tuberculosis Cardiovascular Medical History: Positive for: Hypertension Gastrointestinal Medical History: Positive for: Crohns - Has Chrons Disease Negative for: Cirrhosis ; Colitis; Hepatitis A; Hepatitis B; Hepatitis C Past Medical History Notes: Has a large hiatal hernia. Has Chrons Disease., GERD, pancreatic insufficiency Endocrine Medical History: Negative for: Type [name] Diabetes; Type II Diabetes Past Medical History Notes: Pancreatic Insufficiency Genitourinary Medical History: Negative for: End Stage Renal Disease Past Medical History Notes: stage 3 Chronic Kidney disease Immunological Medical History: Negative for: Lupus Erythematosus; Raynauds; Scleroderma Integumentary (Skin) Medical History: Past Medical History Notes: Had Squamous Cell (skin cancer). Had a skin graft on Right knee (area) RAMI, [name] ([id number]) [id number] Page 9 of 10 Musculoskeletal Medical History: Positive for: Osteoarthritis - Knees Negative for: Gout; Rheumatoid Arthritis; Osteomyelitis Past Medical History Notes: Had Bil knee replacements, hx cervical spine fx, sternal fx, thoracis spine fx Neurologic Medical History: Negative for: Dementia; Neuropathy; Quadriplegia; Paraplegia; Seizure Disorder Past Medical History Notes: left foot drop Oncologic Medical History: Negative for: Received Chemotherapy; Received Radiation Past Medical History Notes: Had Squamous Cell (skin  cell) Psychiatric Medical History: Negative for: Anorexia/bulimia; Confinement Anxiety HBO Extended History Items Ear/Nose/Mouth/Throat: Eyes: Chronic sinus Cataracts problems/congestion Immunizations Pneumococcal Vaccine: Received Pneumococcal Vaccination: Yes Received Pneumococcal Vaccination On or After 60th Birthday: Yes Tetanus Vaccine: Last tetanus shot: 11/16/2011 Implantable Devices None Hospitalization / Surgery History Type of Hospitalization/Surgery bil TKA Moh's procedure right leg 08/17/21 bil cataract extractions nasal fx surgery Family and Social History Cancer: No; Diabetes: Yes - Father; Heart Disease: Yes - Father; Hereditary Spherocytosis: No; Hypertension: Yes - Father; Kidney Disease: No; Lung Disease: No; Seizures: No; Stroke: No; Thyroid Problems: No; Tuberculosis: No; Former smoker - quit 30 years ago; Marital Status - Married; Alcohol Use: Never; Drug Use: No History; Caffeine Use: Rarely; Financial Concerns: No; Food, Clothing or Shelter Needs: No; Support System Lacking: No; Transportation Concerns: No Electronic Signature(s) Signed: 11/02/2021 12:04:20 PM By: [name] MD FACS Entered By: [name] on 11/02/2021 10:40:52 -------------------------------------------------------------------------------- SuperBill Details Patient Name: Date of Service: [name]. 11/02/2021 Medical Record Number: [id number] Patient Account Number: [id number] Date of Birth/Sex: Treating RN: [birth year] (76 y.o. 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, including hip K50.90 Crohn's disease, unspecified, without complications T62 Unspecified severe protein-calorie malnutrition Facility Procedures : CPT4 Code: [id number] Description: 62563 - DEBRIDE WOUND 1ST 20  SQ CM OR < ICD-10 Diagnosis Description B90.564 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 Modifier: d Quantity: 1 Physician Procedures : CPT4 Code Description Modifier [id number] - WC PHYS LEVEL 3 - EST PT 25 ICD-10 Diagnosis Description D56.716 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d T81.31XS Disruption of external operation (surgical)  wound, not elsewhere classified, sequela C44.722 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

## 2021-11-08 ENCOUNTER — Encounter (HOSPITAL_BASED_OUTPATIENT_CLINIC_OR_DEPARTMENT_OTHER): Payer: Medicare Other | Admitting: General Surgery

## 2021-11-11 ENCOUNTER — Emergency Department (HOSPITAL_BASED_OUTPATIENT_CLINIC_OR_DEPARTMENT_OTHER)
Admission: EM | Admit: 2021-11-11 | Discharge: 2021-11-11 | Disposition: A | Discharge status: Home / Self Care | Payer: Medicare Other | Attending: Emergency Medicine | Admitting: Emergency Medicine

## 2021-11-11 ENCOUNTER — Encounter (HOSPITAL_BASED_OUTPATIENT_CLINIC_OR_DEPARTMENT_OTHER): Payer: Self-pay | Admitting: Emergency Medicine

## 2021-11-11 ENCOUNTER — Emergency Department (HOSPITAL_BASED_OUTPATIENT_CLINIC_OR_DEPARTMENT_OTHER): Payer: Medicare Other

## 2021-11-11 DIAGNOSIS — I129 Hypertensive chronic kidney disease with stage 1 through stage 4 chronic kidney disease, or unspecified chronic kidney disease: Secondary | ICD-10-CM | POA: Diagnosis not present

## 2021-11-11 DIAGNOSIS — Z79899 Other long term (current) drug therapy: Secondary | ICD-10-CM | POA: Diagnosis not present

## 2021-11-11 DIAGNOSIS — K5090 Crohn's disease, unspecified, without complications: Secondary | ICD-10-CM | POA: Insufficient documentation

## 2021-11-11 DIAGNOSIS — S0101XA Laceration without foreign body of scalp, initial encounter: Secondary | ICD-10-CM | POA: Diagnosis not present

## 2021-11-11 DIAGNOSIS — N183 Chronic kidney disease, stage 3 unspecified: Secondary | ICD-10-CM | POA: Diagnosis not present

## 2021-11-11 DIAGNOSIS — Z91040 Latex allergy status: Secondary | ICD-10-CM | POA: Insufficient documentation

## 2021-11-11 DIAGNOSIS — S0990XA Unspecified injury of head, initial encounter: Secondary | ICD-10-CM | POA: Diagnosis present

## 2021-11-11 DIAGNOSIS — W01198A Fall on same level from slipping, tripping and stumbling with subsequent striking against other object, initial encounter: Secondary | ICD-10-CM | POA: Diagnosis not present

## 2021-11-11 DIAGNOSIS — W19XXXA Unspecified fall, initial encounter: Secondary | ICD-10-CM

## 2021-11-11 MED ORDER — CEPHALEXIN 250 MG PO CAPS: 250.0000 mg | ORAL_CAPSULE | Freq: Four times a day (QID) | ORAL | 0 refills | Status: AC

## 2021-11-11 MED ORDER — LIDOCAINE-EPINEPHRINE (PF) 2 %-1:200000 IJ SOLN
10.0000 mL | Freq: Once | INTRAMUSCULAR | Status: DC
  Filled 2021-11-11: qty 20

## 2021-11-11 MED ORDER — CEPHALEXIN 250 MG PO CAPS
250.0000 mg | ORAL_CAPSULE | Freq: Once | ORAL | Status: AC
  Administered 2021-11-11: 250 mg via ORAL
  Filled 2021-11-11: qty 1

## 2021-11-11 NOTE — Discharge Instructions (Addendum)
Thank you for coming to [facility] Emergency Department. You were seen for a fall. We did an exam, labs, and imaging, and these showed your known fractures in your neck that are healing as well as a large laceration to your scalp.  This was repaired with 11 staples.  This will need to be removed in 7 to 10 days by medical professional.  Because of the size of the laceration we will treat prophylactically with an antibiotic called Keflex.  This will be 250 mg by mouth 4 times per day for 7 days. Please follow up with your primary care provider within 1 week.  Please call them on Monday morning to make an appointment.  Do not hesitate to return to the ED or call 911 if you experience: -Worsening symptoms -Signs of infection including increased swelling, increased pain, pus drainage -Lightheadedness, passing out -Fevers/chills -Anything else that concerns you

## 2021-11-11 NOTE — ED Triage Notes (Signed)
Pt tripped and fell, hitting her head on the wall. Does not take blood thinners. Pt has laceration to L side of her head.

## 2021-11-11 NOTE — ED Notes (Signed)
Pt agreeable with d/c plan as discussed by provider- this nurse has verbally reinforced d/c instructions and provided pt with written copy- pt acknowledges verbal understanding and denies any addl questions concerns needs- ambulatory at d/c independently accompanied by spouse- pt remains awake and alert; no acute distress/changes-- kerlix wrapped around head is dry and intact

## 2021-11-11 NOTE — ED Provider Notes (Signed)
[facility] EMERGENCY DEPT Provider Note   CSN: [id number] Arrival date & time: 11/11/21  1248     History  Chief Complaint  Patient presents with   [name] is an extremely pleasant 76 y.o. female with CKD stage III, HTN, pancreatic insufficiency, GERD, Crohn's disease, history of SCC of arm, HLD, status post right TKA, spinal fractures presents with fall.   Pt tripped and fell due to her foot drop, hitting her head on the wall. No LOC, no N/V. Mild headache on the left side in the area of a laceration. She does not take blood thinners. Denies visual changes, SOB, Cp, palpitations, lightheadedness. Endorses mild neck pain but not much more than normal - had a c-spine fracture in April and just recently got out of her C-collar. Husband states there was a lot of blood on the floor, patient states she feels okay now except for her mild headache.  Denies any other injuries besides her head.   Fall       Home Medications Prior to Admission medications   Medication Sig Start Date End Date Taking? Authorizing Provider  cephALEXin (KEFLEX) 250 MG capsule Take 1 capsule (250 mg total) by mouth 4 (four) times daily for 7 days. 11/11/21 11/18/21 Yes Audley Hose, MD  amoxicillin-clavulanate (AUGMENTIN) 250-125 MG tablet Take 1 tablet by mouth 2 (two) times daily. 10/31/21   Irene Shipper, MD  Budeson-Glycopyrrol-Formoterol (BREZTRI AEROSPHERE) 160-9-4.8 MCG/ACT AERO Inhale 2 puffs into the lungs in the morning and at bedtime. 10/01/21   Freddi Starr, MD  Calcium Citrate-Vitamin D (CALCIUM + D PO) Take 1 tablet by mouth 2 (two) times daily.    [provider]  cetirizine (ZYRTEC) 10 MG tablet Take 1 tablet (10 mg total) by mouth daily. 09/19/21   Isaac Bliss, Rayford Halsted, MD  colestipol (COLESTID) 1 g tablet Take 2 tablets (2 g total) by mouth 2 (two) times daily. 10/31/21   Irene Shipper, MD  dicyclomine (BENTYL) 20 MG tablet Take 1 tablet (20 mg  total) by mouth 4 (four) times daily as needed for spasms. 10/31/21   Irene Shipper, MD  diphenoxylate-atropine (LOMOTIL) 2.5-0.025 MG tablet Take 2 tablets by mouth in the morning, at noon, and at bedtime. 10/31/21   Irene Shipper, MD  DULoxetine (CYMBALTA) 60 MG capsule Take 1 capsule (60 mg total) by mouth 2 (two) times daily. 09/19/21   Isaac Bliss, Rayford Halsted, MD  fluticasone (FLONASE) 50 MCG/ACT nasal spray Place 2 sprays into both nostrils daily. 09/19/21   Isaac Bliss, Rayford Halsted, MD  furosemide (LASIX) 20 MG tablet Take 1 tablet (20 mg total) by mouth daily as needed for edema. 09/19/21   Isaac Bliss, Rayford Halsted, MD  ipratropium (ATROVENT) 0.03 % nasal spray Place 2 sprays into both nostrils every 12 (twelve) hours. 10/01/21   Freddi Starr, MD  losartan (COZAAR) 100 MG tablet Take 1 tablet (100 mg total) by mouth daily. 09/19/21   Isaac Bliss, Rayford Halsted, MD  mesalamine (LIALDA) 1.2 g EC tablet Take 4 tablets (4.8 g total) by mouth daily with breakfast. 10/31/21   Irene Shipper, MD  Multiple Vitamin (MULTIVITAMIN WITH MINERALS) TABS tablet Take 1 tablet by mouth daily.    [provider]  omeprazole (PRILOSEC) 40 MG capsule Take 1 capsule (40 mg total) by mouth in the morning and at bedtime. 10/31/21   Irene Shipper, MD  valACYclovir (VALTREX) 1000 MG tablet  TAKE 1 TABLET (1,000 MG TOTAL) BY MOUTH TWICE A DAY AS NEEDED 09/19/21   Isaac Bliss, Rayford Halsted, MD      Allergies    Adhesive [tape], Aspirin, Ciprofloxacin, Codeine, Demerol [meperidine hcl], Fentanyl, Lactose intolerance (gi), Latex, Other, Prednisone, and Septra [sulfamethoxazole-trimethoprim]    Review of Systems   Review of Systems Review of systems negative for LOC.  A 10 point review of systems was performed and is negative unless otherwise reported in HPI.  Physical Exam Updated Vital Signs BP (!) 165/86   Pulse 79   Temp 99.2 F (37.3 C) (Oral)   Resp 18   Ht 5' (1.524 m)   Wt 45.4 kg    SpO2 98%   BMI 19.53 kg/m  Physical Exam General: Normal appearing elderly female, lying in bed.  HEENT: PERRLA, Sclera anicteric, MMM, trachea midline. 10 cm linear laceration to her left parietal scalp. Hemostatic. No violation of galea.  Cardiology: RRR, no murmurs/rubs/gallops. Resp: Normal respiratory rate and effort. CTAB, no wheezes, rhonchi, crackles.  Abd: Soft, non-tender, non-distended. No rebound tenderness or guarding.  GU: Deferred. MSK: No peripheral edema or signs of trauma. Extremities without deformity or TTP. No cyanosis or clubbing. Skin: warm, dry.  Back: No midline C-spine tenderness to palpation, no stepoffs.  Neuro: A&Ox4, CNs II-XII grossly intact. MAEs. Sensation grossly intact.  Psych: Normal mood and affect.   ED Results / Procedures / Treatments   Labs (all labs ordered are listed, but only abnormal results are displayed) Labs Reviewed - No data to display  EKG None  Radiology CT Head Wo Contrast  Result Date: 11/11/2021 CLINICAL DATA:  Fall, left head laceration EXAM: CT HEAD WITHOUT CONTRAST CT CERVICAL SPINE WITHOUT CONTRAST TECHNIQUE: Multidetector CT imaging of the head and cervical spine was performed following the standard protocol without intravenous contrast. Multiplanar CT image reconstructions of the cervical spine were also generated. RADIATION DOSE REDUCTION: This exam was performed according to the departmental dose-optimization program which includes automated exposure control, adjustment of the mA and/or kV according to patient size and/or use of iterative reconstruction technique. COMPARISON:  CT cervical spine dated 07/06/2021. CT head dated 05/02/2021. FINDINGS: CT HEAD FINDINGS Brain: No evidence of acute infarction, hemorrhage, hydrocephalus, extra-axial collection or mass lesion/mass effect. Subcortical white matter and periventricular small vessel ischemic changes. Vascular: Mild intracranial atherosclerosis. Skull: Normal. Negative for  fracture or focal lesion. Sinuses/Orbits: Opacification of the left frontal sinus. Partial opacification of the bilateral ethmoid sinuses. Layering fluid in the bilateral maxillary sinuses. Mastoid air cells are clear. Other: Soft tissue laceration overlying the left frontal bone (series 4/image 61). CT CERVICAL SPINE FINDINGS Alignment: Mild reversal of the mid cervical lordosis. Skull base and vertebrae: Healing type 2 dens fracture (sagittal image 27). Fracture lucency remains visible but is mildly improved from the prior. Stable mild compression fracture deformity at T1 (sagittal image 28), unchanged. Soft tissues and spinal canal: No prevertebral fluid or swelling. No visible canal hematoma. Disc levels: Mild degenerative changes of the mid/lower cervical spine. Spinal canal remains patent. Upper chest: Visualized lung apices are notable for emphysematous changes. Other: Visualized thyroid is unremarkable. IMPRESSION: Soft tissue laceration overlying the left frontal bone. No evidence of calvarial fracture. No evidence of acute intracranial abnormality. Small vessel ischemic changes. No evidence of acute traumatic injury to the cervical spine. Healing type 2 dens fracture. Fracture lucency remains visible but is mildly improved from the prior. Stable mild compression fracture deformity at T1. Electronically Signed   By:  Julian Hy M.D.   On: 11/11/2021 19:17   CT Cervical Spine Wo Contrast  Result Date: 11/11/2021 CLINICAL DATA:  Fall, left head laceration EXAM: CT HEAD WITHOUT CONTRAST CT CERVICAL SPINE WITHOUT CONTRAST TECHNIQUE: Multidetector CT imaging of the head and cervical spine was performed following the standard protocol without intravenous contrast. Multiplanar CT image reconstructions of the cervical spine were also generated. RADIATION DOSE REDUCTION: This exam was performed according to the departmental dose-optimization program which includes automated exposure control, adjustment of  the mA and/or kV according to patient size and/or use of iterative reconstruction technique. COMPARISON:  CT cervical spine dated 07/06/2021. CT head dated 05/02/2021. FINDINGS: CT HEAD FINDINGS Brain: No evidence of acute infarction, hemorrhage, hydrocephalus, extra-axial collection or mass lesion/mass effect. Subcortical white matter and periventricular small vessel ischemic changes. Vascular: Mild intracranial atherosclerosis. Skull: Normal. Negative for fracture or focal lesion. Sinuses/Orbits: Opacification of the left frontal sinus. Partial opacification of the bilateral ethmoid sinuses. Layering fluid in the bilateral maxillary sinuses. Mastoid air cells are clear. Other: Soft tissue laceration overlying the left frontal bone (series 4/image 61). CT CERVICAL SPINE FINDINGS Alignment: Mild reversal of the mid cervical lordosis. Skull base and vertebrae: Healing type 2 dens fracture (sagittal image 27). Fracture lucency remains visible but is mildly improved from the prior. Stable mild compression fracture deformity at T1 (sagittal image 28), unchanged. Soft tissues and spinal canal: No prevertebral fluid or swelling. No visible canal hematoma. Disc levels: Mild degenerative changes of the mid/lower cervical spine. Spinal canal remains patent. Upper chest: Visualized lung apices are notable for emphysematous changes. Other: Visualized thyroid is unremarkable. IMPRESSION: Soft tissue laceration overlying the left frontal bone. No evidence of calvarial fracture. No evidence of acute intracranial abnormality. Small vessel ischemic changes. No evidence of acute traumatic injury to the cervical spine. Healing type 2 dens fracture. Fracture lucency remains visible but is mildly improved from the prior. Stable mild compression fracture deformity at T1. Electronically Signed   By: Julian Hy M.D.   On: 11/11/2021 19:17    Procedures .Marland KitchenLaceration Repair  Date/Time: 11/11/2021 8:52 PM  Performed by: Audley Hose, MD Authorized by: Audley Hose, MD   Consent:    Consent obtained:  Verbal   Consent given by:  Patient   Risks, benefits, and alternatives were discussed: yes     Risks discussed:  Need for additional repair, nerve damage, infection, pain, poor cosmetic result, poor wound healing, vascular damage, tendon damage and retained foreign body   Alternatives discussed:  No treatment Universal protocol:    Procedure explained and questions answered to patient or proxy's satisfaction: yes     Relevant documents present and verified: yes     Imaging studies available: yes     Required blood products, implants, devices, and special equipment available: yes     Site/side marked: yes     Immediately prior to procedure, a time out was called: yes     Patient identity confirmed:  Verbally with patient Anesthesia:    Anesthesia method:  Local infiltration   Local anesthetic:  Lidocaine 2% WITH epi Laceration details:    Location:  Scalp   Scalp location:  L parietal   Length (cm):  10   Depth (mm):  5 Pre-procedure details:    Preparation:  Patient was prepped and draped in usual sterile fashion and imaging obtained to evaluate for foreign bodies Exploration:    Hemostasis achieved with:  Direct pressure   Imaging obtained  comment:  CT   Wound exploration: wound explored through full range of motion and entire depth of wound visualized     Wound extent: areolar tissue not violated, fascia not violated, no foreign body, no signs of injury, no nerve damage, no tendon damage, no underlying fracture and no vascular damage     Contaminated: no   Treatment:    Area cleansed with:  Saline   Amount of cleaning:  Extensive   Irrigation solution:  Sterile saline   Irrigation volume:  1L   Irrigation method:  Tap   Debridement:  None   Undermining:  None Skin repair:    Repair method:  Staples   Number of staples:  11 Approximation:    Approximation:  Close Repair type:    Repair  type:  Simple Post-procedure details:    Dressing:  Sterile dressing   Procedure completion:  Tolerated well, no immediate complications     Medications Ordered in ED Medications  lidocaine-EPINEPHrine (XYLOCAINE W/EPI) 2 %-1:200000 (PF) injection 10 mL (has no administration in time range)  cephALEXin (KEFLEX) capsule 250 mg (has no administration in time range)    ED Course/ Medical Decision Making/ A&P                          Medical Decision Making Amount and/or Complexity of Data Reviewed Radiology: ordered. Decision-making details documented in ED Course.  Risk Prescription drug management.    Patient is overall very well-appearing, hemodynamically stable, afebrile.  Currently complaining of only a mild headache.  Consider ICH, skull fracture, new c-spine fracture. Consider acute blood loss anemia and will obtain H&H. Laceration is large and due to depth and size will treat with keflex.   CT head without any acute changes.  CT C-spine demonstrates her healing fractures with no new fractures.  Patient did recently receive a Tdap vaccination on 09/13/21.   Laceration is numbed, cleaned, and repaired with staples.  There was no violation of the galea that needed to be repaired with 6 sutures so patient was pair with 11 staples.  Discussed with patient and her husband that she will need to have these removed in 7 to 10 days by healthcare provider.  She states she will call her PCP in the morning to make an appointment..  She is given discharge instructions related to the staples as well as return precautions for infection.  Based on the size of the wound will give Keflex prophylactically.  I have personally reviewed and interpreted all imaging.   Clinical Course as of 11/11/21 2054  Nancy Fetter Nov 11, 2021  1921 CT Head Wo Contrast Soft tissue laceration overlying the left frontal bone. No evidence of calvarial fracture.  No evidence of acute intracranial abnormality. Small vessel  ischemic changes.  No evidence of acute traumatic injury to the cervical spine.  Healing type 2 dens fracture. Fracture lucency remains visible but is mildly improved from the prior. Stable mild compression fracture deformity at T1.   [HN]  2048 Offered patient H&H d/t husband describing blood lost on scene but patient denies stating that she feels okay and doesn't think she lost that much blood. Discussed with patient that if she feels lightheaded, SOB, or weak she may need a blood transfusion and she understands this. She prefers to f/u with her PCP.  [HN]    Clinical Course User Index [HN] Audley Hose, MD    Patient is discharged in the care of  her husband with discharge instructions and return precautions, she is very well-appearing and hemodynamically about time of discharge.  A dose of Keflex is given prior to patient leaving.  Dispo: DC         Final Clinical Impression(s) / ED Diagnoses Final diagnoses:  Fall, initial encounter  Laceration of scalp, initial encounter    Rx / DC Orders ED Discharge Orders          Ordered    cephALEXin (KEFLEX) 250 MG capsule  4 times daily        11/11/21 2033             This note was created using dictation software, which may contain spelling or grammatical errors.    Audley Hose, MD 11/11/21 2055

## 2021-11-12 ENCOUNTER — Encounter (HOSPITAL_BASED_OUTPATIENT_CLINIC_OR_DEPARTMENT_OTHER): Payer: Medicare Other | Attending: General Surgery | Admitting: General Surgery

## 2021-11-12 DIAGNOSIS — I890 Lymphedema, not elsewhere classified: Secondary | ICD-10-CM | POA: Insufficient documentation

## 2021-11-12 DIAGNOSIS — C44722 Squamous cell carcinoma of skin of right lower limb, including hip: Secondary | ICD-10-CM | POA: Diagnosis not present

## 2021-11-12 DIAGNOSIS — E43 Unspecified severe protein-calorie malnutrition: Secondary | ICD-10-CM | POA: Diagnosis not present

## 2021-11-12 DIAGNOSIS — L97812 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d: Secondary | ICD-10-CM | POA: Insufficient documentation

## 2021-11-12 DIAGNOSIS — I10 Essential (primary) hypertension: Secondary | ICD-10-CM | POA: Diagnosis not present

## 2021-11-12 DIAGNOSIS — K5090 Crohn's disease, unspecified, without complications: Secondary | ICD-10-CM | POA: Insufficient documentation

## 2021-11-12 DIAGNOSIS — X58XXXS Exposure to other specified factors, sequela: Secondary | ICD-10-CM | POA: Diagnosis not present

## 2021-11-12 DIAGNOSIS — T8131XS Disruption of external operation (surgical) wound, not elsewhere classified, sequela: Secondary | ICD-10-CM | POA: Diagnosis not present

## 2021-11-12 DIAGNOSIS — Z87891 Personal history of nicotine dependence: Secondary | ICD-10-CM | POA: Diagnosis not present

## 2021-11-12 DIAGNOSIS — M170 Bilateral primary osteoarthritis of knee: Secondary | ICD-10-CM | POA: Diagnosis not present

## 2021-11-12 DIAGNOSIS — Z85828 Personal history of other malignant neoplasm of skin: Secondary | ICD-10-CM | POA: Diagnosis not present

## 2021-11-12 DIAGNOSIS — Z96653 Presence of artificial knee joint, bilateral: Secondary | ICD-10-CM | POA: Diagnosis not present

## 2021-11-12 NOTE — Progress Notes (Signed)
April, TIANO [name] ([id number]) [id number] Page 1 of 9 Visit Report for 11/12/2021 Chief Complaint Document Details Patient Name: Date of Service: [name], [name]. 11/12/2021 10:30 A M Medical Record Number: [id number] Patient Account Number: [id number] Date of Birth/Sex: Treating RN: 1945-08-27 (76 y.o. F) Primary Care Provider: Domingo Mend Other Clinician: Referring Provider: Treating Provider/Extender: Caprice Kluver in Treatment: 7 Information Obtained from: Patient Chief Complaint 11/15/2020; patient is here for review of a wound area on the right posterior calf 09/20/2021: Patient is here for a nonhealing wound after undergoing Mohs surgery on her right lower leg. Electronic Signature(s) Signed: 11/12/2021 11:16:08 AM By: [name] MD FACS Entered By: [name] on 11/12/2021 11:16:08 -------------------------------------------------------------------------------- HPI Details Patient Name: Date of Service: [name] [name]. 11/12/2021 10:30 A M Medical Record Number: [id number] Patient Account Number: [id number] Date of Birth/Sex: Treating RN: August 26, 1945 (76 y.o. F) Primary Care Provider: Domingo Mend Other Clinician: Referring Provider: Treating Provider/Extender: Gevena Cotton Weeks in Treatment: 7 History of Present Illness HPI Description: ADMISSION 11/15/2020 This is a 76 year old woman with a history of squamous cell cancer skin cancer followed by Dr. Anabel Bene dermatology. She describes having a wound on the right posterior calf dating back since September. She saw her primary doctor in early October who suggested petroleum infused gauze. The patient has been doing this. No real improvement in fact she says this is getting larger. She does not complain much of pain. She is completely uncertain about how this happened there was no trauma she simply became aware of it at some  point describing it is about the size of a quarter but it has been getting progressively larger since then. She is here for review of this. Past medical history Crohn's disease not currently on prednisone, gastroesophageal reflux disease, history of squamous cell skin cancer, history of rheumatoid arthritis, hypertension 11/16; this lady had a large superficial atypical wound on the right posterior calf. She has significant solar skin damage probably some degree of venous insufficiency but she described a rapidly expanding wound area. Because of this and a history of skin cancer [name] went ahead and did a shave biopsy but we do not have the results of this at the time we saw her in clinic today. She has been using silver alginate and kerlix 11/30; shave biopsy [name] did last time did not show any malignancy or fungal elements. Suggestion of a stasis ulcer. With our treatment which is simply been silver alginate with kerlix that she is changing daily the wound is down dramatically almost surprising how rapidly it is reduced in surface area 12/14; patient comes in today with her wound healed this was on the right posterior calf. She has significant solar skin damage probably some degree of venous insufficiency however she described a rapidly expanding wound area on admission. Because of this and a history of skin cancer [name] did do a shave biopsy but nothing was really found of worry. 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 Unfortunately, the wound has failed to heal. There is thick slough on the wound surface. The periwound skin is in reasonably good condition. There is granulation tissue forming underneath the slough. 09/28/2021: The wound has  deteriorated  rather significantly over the past week. She had fairly copious drainage that she noticed as early as Monday, but she YARELLY, KUBA [name] ([id number]) [id number] Page 2 of 9 did not contact our office. Today there is a lot of periwound skin breakdown and a very pungent odor coming from the wound. There is thick slough on the wound surface but there is still good granulation tissue underlying this. Despite this, however, the wound actually measures smaller. 10/05/2021: The culture that [name] took grew out MRSA. [name] prescribed doxycycline and she is still taking this. There has been a tremendous improvement in the wound. It is smaller and has contracted considerably. There is hypertrophic granulation tissue present. No malodorous drainage. 10/16/2021: The wound has contracted further and is epithelializing. The surface is very clean and the hypertrophic granulation tissue has not recurred. She completed her course of doxycycline. 10/19; surgical wound on the right medial lower extremity secondary to Mohs surgery done in August. She is using Hydrofera Blue under 3 layer compression and making excellent progress. 11/02/2021: The wound is nearly completely epithelialized. Just a couple of open areas with some eschar overlying the surface. 11/12/2021: Just 1 small open area remains. It is clean without any slough or eschar accumulation. No hypertrophic granulation tissue present. Electronic Signature(s) Signed: 11/12/2021 11:16:53 AM By: [name] MD FACS Entered By: [name] on 11/12/2021 11:16:53 -------------------------------------------------------------------------------- Physical Exam Details Patient Name: Date of Service: [name] [name]. 11/12/2021 10:30 A M Medical Record Number: [id number] Patient Account Number: [id number] Date of Birth/Sex: Treating RN: 17-Feb-1945 (76 y.o. F) Primary Care Provider: Domingo Mend Other Clinician: Referring  Provider: Treating Provider/Extender: Gevena Cotton Weeks in Treatment: 7 Constitutional Hypertensive, asymptomatic. . . . No acute distress.Marland Kitchen Respiratory Normal work of breathing on room air.. Notes 11/12/2021: Just 1 small open area remains. It is clean without any slough or eschar accumulation. No hypertrophic granulation tissue present. Electronic Signature(s) Signed: 11/12/2021 11:35:09 AM By: [name] MD FACS Previous Signature: 11/12/2021 11:17:56 AM Version By: [name] MD FACS Entered By: [name] on 11/12/2021 11:35:08 -------------------------------------------------------------------------------- Physician Orders Details Patient Name: Date of Service: [name] [name]. 11/12/2021 10:30 A M Medical Record Number: [id number] Patient Account Number: [id number] Date of Birth/Sex: Treating RN: 14-Oct-1945 (76 y.o. [name] Primary Care Provider: Domingo Mend Other Clinician: Referring Provider: Treating Provider/Extender: Gevena Cotton Weeks in Treatment: 7 Verbal / Phone Orders: No Diagnosis Coding ICD-10 Coding Code Description [phone] 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d T81.31XS Disruption of external operation (surgical) wound, not elsewhere classified, sequela LYNNIAH, JANOSKI [name] ([id number]) [id number] Page 3 of 9 I10 Essential (primary) hypertension C44.722 Squamous cell carcinoma of skin of right lower limb, including hip K50.90 Crohn's disease, unspecified, without complications V74 Unspecified severe protein-calorie malnutrition Follow-up Appointments ppointment in 1 week. - Dr. [name] RM 1 Return A Anesthetic Wound #2 Right,Lateral Lower Leg (In clinic) Topical Lidocaine 4% applied to wound bed Bathing/ Shower/ Hygiene May shower with protection but do not get wound dressing(s) wet. Edema Control -  Lymphedema / SCD / Other Elevate legs to the level of the heart or above for 30 minutes daily and/or when sitting, a frequency of: Avoid standing for long periods of time. Exercise regularly Wound Treatment Wound #2 - Lower Leg Wound Laterality: Right, Lateral Peri-Wound Care: Sween Lotion (Moisturizing lotion) 1 x Per Week/30 Days Discharge Instructions: Apply moisturizing lotion  as directed Prim Dressing: Hydrofera Blue Ready Foam, 2.5 x2.5 in 1 x Per Week/30 Days ary Discharge Instructions: Apply to wound bed as instructed Secondary Dressing: Woven Gauze Sponge, Non-Sterile 4x4 in 1 x Per Week/30 Days Discharge Instructions: Apply over primary dressing as directed. Compression Wrap: ThreePress (3 layer compression wrap) 1 x Per Week/30 Days Discharge Instructions: Apply three layer compression as directed. Electronic Signature(s) Signed: 11/12/2021 12:07:14 PM By: [name] MD FACS Entered By: [name] on 11/12/2021 11:35:34 -------------------------------------------------------------------------------- Problem List Details Patient Name: Date of Service: [name] [name]. 11/12/2021 10:30 A M Medical Record Number: [id number] Patient Account Number: [id number] Date of Birth/Sex: Treating RN: 02-23-1945 (76 y.o. [name] Primary Care Provider: Domingo Mend Other Clinician: Referring Provider: Treating Provider/Extender: Gevena Cotton Weeks in Treatment: 7 Active Problems ICD-10 Encounter Code Description Active Date MDM Diagnosis L97.812 Non-pressure chronic ulcer of other part of right lower leg with fat layer 09/20/2021 No Yes exposed T81.31XS Disruption of external operation (surgical) wound, not elsewhere classified, 09/20/2021 No Yes sequela I10 Essential (primary) hypertension 09/20/2021 No Yes GENEVIE, ELMAN [name] ([id number]) [id number] Page 4 of 9 [phone] Squamous cell carcinoma of skin of right  lower limb, including hip 09/20/2021 No Yes K50.90 Crohn's disease, unspecified, without complications 04/24/4079 No Yes E43 Unspecified severe protein-calorie malnutrition 09/20/2021 No Yes Inactive Problems Resolved Problems Electronic Signature(s) Signed: 11/12/2021 11:15:53 AM By: [name] MD FACS Entered By: [name] on 11/12/2021 11:15:53 -------------------------------------------------------------------------------- Progress Note Details Patient Name: Date of Service: [name] [name]. 11/12/2021 10:30 A M Medical Record Number: [id number] Patient Account Number: [id number] Date of Birth/Sex: Treating RN: December 12, 1945 (76 y.o. F) Primary Care Provider: Domingo Mend Other Clinician: Referring Provider: Treating Provider/Extender: Caprice Kluver in Treatment: 7 Subjective Chief Complaint Information obtained from Patient 11/15/2020; patient is here for review of a wound area on the right posterior calf 09/20/2021: Patient is here for a nonhealing wound after undergoing Mohs surgery on her right lower leg. History of Present Illness (HPI) ADMISSION 11/15/2020 This is a 76 year old woman with a history of squamous cell cancer skin cancer followed by Dr. Anabel Bene dermatology. She describes having a wound on the right posterior calf dating back since September. She saw her primary doctor in early October who suggested petroleum infused gauze. The patient has been doing this. No real improvement in fact she says this is getting larger. She does not complain much of pain. She is completely uncertain about how this happened there was no trauma she simply became aware of it at some point describing it is about the size of a quarter but it has been getting progressively larger since then. She is here for review of this. Past medical history Crohn's disease not currently on prednisone, gastroesophageal reflux disease, history of squamous cell skin  cancer, history of rheumatoid arthritis, hypertension 11/16; this lady had a large superficial atypical wound on the right posterior calf. She has significant solar skin damage probably some degree of venous insufficiency but she described a rapidly expanding wound area. Because of this and a history of skin cancer [name] went ahead and did a shave biopsy but we do not have the results of this at the time we saw her in clinic today. She has been using silver alginate and kerlix 11/30; shave biopsy [name] did last time did not show any malignancy or fungal elements. Suggestion of a stasis ulcer. With our treatment which is simply  been silver alginate with kerlix that she is changing daily the wound is down dramatically almost surprising how rapidly it is reduced in surface area 12/14; patient comes in today with her wound healed this was on the right posterior calf. She has significant solar skin damage probably some degree of venous insufficiency however she described a rapidly expanding wound area on admission. Because of this and a history of skin cancer [name] did do a shave biopsy but nothing was really found of worry. 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 Unfortunately, the wound has failed to heal. There is thick slough on the wound surface. The periwound skin is in reasonably good condition. There is granulation tissue forming underneath the slough. 09/28/2021: The wound has deteriorated rather significantly over the past week. She had fairly copious drainage that she noticed as early as Monday, but she did not contact our office. Today there is a lot of periwound skin breakdown and a very pungent odor coming from the wound. There is thick slough on  the wound surface but there is still good granulation tissue underlying this. Despite this, however, the wound actually measures smaller. ZELPHIA, GLOVER [name] ([id number]) [id number] Page 5 of 9 10/05/2021: The culture that [name] took grew out MRSA. [name] prescribed doxycycline and she is still taking this. There has been a tremendous improvement in the wound. It is smaller and has contracted considerably. There is hypertrophic granulation tissue present. No malodorous drainage. 10/16/2021: The wound has contracted further and is epithelializing. The surface is very clean and the hypertrophic granulation tissue has not recurred. She completed her course of doxycycline. 10/19; surgical wound on the right medial lower extremity secondary to Mohs surgery done in August. She is using Hydrofera Blue under 3 layer compression and making excellent progress. 11/02/2021: The wound is nearly completely epithelialized. Just a couple of open areas with some eschar overlying the surface. 11/12/2021: Just 1 small open area remains. It is clean without any slough or eschar accumulation. No hypertrophic granulation tissue present. Patient History Information obtained from Patient. Family History Diabetes - Father, Heart Disease - Father, Hypertension - Father, No family history of Cancer, Hereditary Spherocytosis, Kidney Disease, Lung Disease, Seizures, Stroke, Thyroid Problems, Tuberculosis. Social History Former smoker - quit 30 years ago, Marital Status - Married, Alcohol Use - Never, Drug Use - No History, Caffeine Use - Rarely. Medical History Eyes Patient has history of Cataracts - Had cataract surgery Denies history of Glaucoma, Optic Neuritis Ear/Nose/Mouth/Throat Patient has history of Chronic sinus problems/congestion - Post Nasal drip Denies history of Middle ear problems Hematologic/Lymphatic Denies history of Anemia, Hemophilia, Human Immunodeficiency Virus, Lymphedema, Sickle  Cell Disease Respiratory Denies history of Aspiration, Asthma, Chronic Obstructive Pulmonary Disease (COPD), Pneumothorax, Sleep Apnea, Tuberculosis Cardiovascular Patient has history of Hypertension Gastrointestinal Patient has history of Crohnoos - Has Chrons Disease Denies history of Cirrhosis , Colitis, Hepatitis A, Hepatitis B, Hepatitis C Endocrine Denies history of Type [name] Diabetes, Type II Diabetes Genitourinary Denies history of End Stage Renal Disease Immunological Denies history of Lupus Erythematosus, Raynaudoos, Scleroderma Musculoskeletal Patient has history of Osteoarthritis - Knees Denies history of Gout, Rheumatoid Arthritis, Osteomyelitis Neurologic Denies history of Dementia, Neuropathy, Quadriplegia, Paraplegia, Seizure Disorder Oncologic Denies history of Received Chemotherapy, Received Radiation  Psychiatric Denies history of Anorexia/bulimia, Confinement Anxiety Hospitalization/Surgery History - bil TKA. - Moh's procedure right leg 08/17/21. - bil cataract extractions. - nasal fx surgery. Medical A Surgical History Notes nd Gastrointestinal Has a large hiatal hernia. Has Chrons Disease., GERD, pancreatic insufficiency Endocrine Pancreatic Insufficiency Genitourinary stage 3 Chronic Kidney disease Integumentary (Skin) Had Squamous Cell (skin cancer). Had a skin graft on Right knee (area) Musculoskeletal Had Bil knee replacements, hx cervical spine fx, sternal fx, thoracis spine fx Neurologic left foot drop Oncologic Had Squamous Cell (skin cell) Objective Constitutional Hypertensive, asymptomatic. No acute distress.. Vitals Time Taken: 10:30 AM, Height: 60 in, Weight: 101 lbs, BMI: 19.7, Temperature: 97.6 F, Pulse: 85 bpm, Respiratory Rate: 20 breaths/min, Blood Pressure: 170/84 mmHg. SHERREY, NORTH [name] ([id number]) [id number] Page 6 of 9 Respiratory Normal work of breathing on room air.. General Notes: 11/12/2021: Just 1  small open area remains. It is clean without any slough or eschar accumulation. No hypertrophic granulation tissue present. Integumentary (Hair, Skin) Wound #2 status is Open. Original cause of wound was Surgical Injury. The date acquired was: 08/17/2021. The wound has been in treatment 7 weeks. The wound is located on the Right,Lateral Lower Leg. The wound measures 0.8cm length x 0.8cm width x 0.1cm depth; 0.503cm^2 area and 0.05cm^3 volume. There is Fat Layer (Subcutaneous Tissue) exposed. There is no tunneling or undermining noted. There is a medium amount of serosanguineous drainage noted. The wound margin is distinct with the outline attached to the wound base. There is large (67-100%) red granulation within the wound bed. There is no necrotic tissue within the wound bed. The periwound skin appearance had no abnormalities noted for texture. The periwound skin appearance had no abnormalities noted for moisture. The periwound skin appearance had no abnormalities noted for color. Periwound temperature was noted as No Abnormality. Assessment Active Problems ICD-10 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d Disruption of external operation (surgical) wound, not elsewhere classified, sequela Essential (primary) hypertension Squamous cell carcinoma of skin of right lower limb, including hip Crohn's disease, unspecified, without complications Unspecified severe protein-calorie malnutrition Procedures Wound #2 Pre-procedure diagnosis of Wound #2 is an Open Surgical Wound located on the Right,Lateral Lower Leg . There was a Three Layer Compression Therapy Procedure by Baruch Gouty, RN. Post procedure Diagnosis Wound #2: Same as Pre-Procedure Plan Follow-up Appointments: Return Appointment in 1 week. - Dr. [name] RM 1 Anesthetic: Wound #2 Right,Lateral Lower Leg: (In clinic) Topical Lidocaine 4% applied to wound bed Bathing/ Shower/ Hygiene: May shower  with protection but do not get wound dressing(s) wet. Edema Control - Lymphedema / SCD / Other: Elevate legs to the level of the heart or above for 30 minutes daily and/or when sitting, a frequency of: Avoid standing for long periods of time. Exercise regularly WOUND #2: - Lower Leg Wound Laterality: Right, Lateral Peri-Wound Care: Sween Lotion (Moisturizing lotion) 1 x Per Week/30 Days Discharge Instructions: Apply moisturizing lotion as directed Prim Dressing: Hydrofera Blue Ready Foam, 2.5 x2.5 in 1 x Per Week/30 Days ary Discharge Instructions: Apply to wound bed as instructed Secondary Dressing: Woven Gauze Sponge, Non-Sterile 4x4 in 1 x Per Week/30 Days Discharge Instructions: Apply over primary dressing as directed. Com pression Wrap: ThreePress (3 layer compression wrap) 1 x Per Week/30 Days Discharge Instructions: Apply three layer compression as directed. 11/12/2021: Just 1 small open area remains. It is clean without any slough or eschar accumulation. No hypertrophic granulation tissue present. No debridement was necessary  today. We will continue Hydrofera Blue and 3 layer compression. [name] anticipate that she will be completely healed next week. Follow-up then. Electronic Signature(s) Signed: 11/12/2021 11:35:57 AM By: [name] MD FACS Entered By: [name] on 11/12/2021 11:35:57 [place]arra, Parks Ranger ([id number]) [id number] Page 7 of 9 -------------------------------------------------------------------------------- HxROS Details Patient Name: Date of Service: ILYSE, TREMAIN [name]. 11/12/2021 10:30 A M Medical Record Number: [id number] Patient Account Number: [id number] Date of Birth/Sex: Treating RN: 1945-10-21 (76 y.o. F) Primary Care Provider: Domingo Mend Other Clinician: Referring Provider: Treating Provider/Extender: Caprice Kluver in Treatment: 7 Information Obtained From Patient Eyes Medical  History: Positive for: Cataracts - Had cataract surgery Negative for: Glaucoma; Optic Neuritis Ear/Nose/Mouth/Throat Medical History: Positive for: Chronic sinus problems/congestion - Post Nasal drip Negative for: Middle ear problems Hematologic/Lymphatic Medical History: Negative for: Anemia; Hemophilia; Human Immunodeficiency Virus; Lymphedema; Sickle Cell Disease Respiratory Medical History: Negative for: Aspiration; Asthma; Chronic Obstructive Pulmonary Disease (COPD); Pneumothorax; Sleep Apnea; Tuberculosis Cardiovascular Medical History: Positive for: Hypertension Gastrointestinal Medical History: Positive for: Crohns - Has Chrons Disease Negative for: Cirrhosis ; Colitis; Hepatitis A; Hepatitis B; Hepatitis C Past Medical History Notes: Has a large hiatal hernia. Has Chrons Disease., GERD, pancreatic insufficiency Endocrine Medical History: Negative for: Type [name] Diabetes; Type II Diabetes Past Medical History Notes: Pancreatic Insufficiency Genitourinary Medical History: Negative for: End Stage Renal Disease Past Medical History Notes: stage 3 Chronic Kidney disease Immunological Medical History: Negative for: Lupus Erythematosus; Raynauds; Scleroderma Integumentary (Skin) Medical History: Past Medical History Notes: Had Squamous Cell (skin cancer). Had a skin graft on Right knee (area) YARENIS, [name] [name] ([id number]) [id number] Page 8 of 9 Musculoskeletal Medical History: Positive for: Osteoarthritis - Knees Negative for: Gout; Rheumatoid Arthritis; Osteomyelitis Past Medical History Notes: Had Bil knee replacements, hx cervical spine fx, sternal fx, thoracis spine fx Neurologic Medical History: Negative for: Dementia; Neuropathy; Quadriplegia; Paraplegia; Seizure Disorder Past Medical History Notes: left foot drop Oncologic Medical History: Negative for: Received Chemotherapy; Received Radiation Past Medical History Notes: Had  Squamous Cell (skin cell) Psychiatric Medical History: Negative for: Anorexia/bulimia; Confinement Anxiety HBO Extended History Items Ear/Nose/Mouth/Throat: Eyes: Chronic sinus Cataracts problems/congestion Immunizations Pneumococcal Vaccine: Received Pneumococcal Vaccination: Yes Received Pneumococcal Vaccination On or After 60th Birthday: Yes Tetanus Vaccine: Last tetanus shot: 11/16/2011 Implantable Devices None Hospitalization / Surgery History Type of Hospitalization/Surgery bil TKA Moh's procedure right leg 08/17/21 bil cataract extractions nasal fx surgery Family and Social History Cancer: No; Diabetes: Yes - Father; Heart Disease: Yes - Father; Hereditary Spherocytosis: No; Hypertension: Yes - Father; Kidney Disease: No; Lung Disease: No; Seizures: No; Stroke: No; Thyroid Problems: No; Tuberculosis: No; Former smoker - quit 30 years ago; Marital Status - Married; Alcohol Use: Never; Drug Use: No History; Caffeine Use: Rarely; Financial Concerns: No; Food, Clothing or Shelter Needs: No; Support System Lacking: No; Transportation Concerns: No Electronic Signature(s) Signed: 11/12/2021 12:07:14 PM By: [name] MD FACS Entered By: [name] on 11/12/2021 11:17:00 -------------------------------------------------------------------------------- SuperBill Details Patient Name: Date of Service: [name] [name]. 11/12/2021 Medical Record Number: [id number] Patient Account Number: [id number] Date of Birth/Sex: Treating RN: 02-27-1945 (76 y.o. 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, including hip K50.90 Crohn's disease, unspecified, without complications Z00 Unspecified severe protein-calorie malnutrition Physician Procedures : CPT4 Code Description Modifier [id number] - WC PHYS LEVEL 3 - EST PT ICD-10 Diagnosis Description L97.812 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d T81.31XS Disruption of external operation (surgical) wound,  not elsewhere classified, sequela C44.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

## 2021-11-12 NOTE — Progress Notes (Signed)
MACKENZYE, MACKEL [name] ([id number]) [id number] Page 1 of 7 Visit Report for 11/12/2021 Arrival Information Details Patient Name: Date of Service: [name], [name]. 11/12/2021 10:30 A M Medical Record Number: [id number] Patient Account Number: [id number] Date of Birth/Sex: Treating RN: [birth year] (76 y.o. F) Primary Care Yakir Wenke: Domingo Mend Other Clinician: Referring Hazim Treadway: Treating Sallyann Kinnaird/Extender: Caprice Kluver in Treatment: 7 Visit Information History Since Last Visit All ordered tests and consults were completed: No Patient Arrived: Ambulatory Added or deleted any medications: No Arrival Time: 10:30 Any new allergies or adverse reactions: No Transfer Assistance: None Had a fall or experienced change in No Patient Identification Verified: Yes activities of daily living that may affect Secondary Verification Process Completed: Yes risk of falls: Patient Requires Transmission-Based Precautions: No Signs or symptoms of abuse/neglect since last visito No Patient Has Alerts: No Hospitalized since last visit: No Implantable device outside of the clinic excluding No cellular tissue based products placed in the center since last visit: Pain Present Now: No Electronic Signature(s) Signed: 11/12/2021 2:31:35 PM By: Worthy Rancher Entered By: Worthy Rancher on 11/12/2021 10:30:21 -------------------------------------------------------------------------------- Compression Therapy Details Patient Name: Date of Service: [name]. 11/12/2021 10:30 A M Medical Record Number: [id number] Patient Account Number: [id number] Date of Birth/Sex: Treating RN: January 12, 1945 (76 y.o. [name] Primary Care Karely Hurtado: Domingo Mend Other Clinician: Referring Kardell Virgil: Treating Tai Syfert/Extender: Gevena Cotton Weeks in Treatment: 7 Compression Therapy Performed for Wound Assessment: Wound #2 Right,Lateral  Lower Leg Performed By: Clinician Baruch Gouty, RN Compression Type: Three Layer Post Procedure Diagnosis Same as Pre-procedure Electronic Signature(s) Signed: 11/12/2021 4:54:31 PM By: Baruch Gouty RN, BSN Entered By: Baruch Gouty on 11/12/2021 10:51:46 Bines, Parks Ranger ([id number]) [id number] Page 2 of 7 -------------------------------------------------------------------------------- Encounter Discharge Information Details Patient Name: Date of Service: AILANY, KOREN 11/12/2021 10:30 A M Medical Record Number: [id number] Patient Account Number: [id number] Date of Birth/Sex: Treating RN: 09-08-1945 (76 y.o. [name] Primary Care Makenna Macaluso: Domingo Mend Other Clinician: Referring Crockett Rallo: Treating Azure Barrales/Extender: Caprice Kluver in Treatment: 7 Encounter Discharge Information Items Discharge Condition: Stable Ambulatory Status: Ambulatory Discharge Destination: Home Transportation: Private Auto Accompanied By: self Schedule Follow-up Appointment: Yes Clinical Summary of Care: Patient Declined Electronic Signature(s) Signed: 11/12/2021 4:54:31 PM By: Baruch Gouty RN, BSN Entered By: Baruch Gouty on 11/12/2021 11:23:35 -------------------------------------------------------------------------------- Lower Extremity Assessment Details Patient Name: Date of Service: [name]. 11/12/2021 10:30 A M Medical Record Number: [id number] Patient Account Number: [id number] Date of Birth/Sex: Treating RN: 05-Sep-1945 (76 y.o. [name] Primary Care Azyah Flett: Domingo Mend Other Clinician: Referring Emmett Arntz: Treating Terah Robey/Extender: Gevena Cotton Weeks in Treatment: 7 Edema Assessment Assessed: [Left: No] [Right: No] Edema: [Left: Ye] [Right: s] Calf Left: Right: Point of Measurement: From Medial Instep 27.5 cm Ankle Left: Right: Point of Measurement:  From Medial Instep 19.4 cm Vascular Assessment Pulses: Dorsalis Pedis Palpable: [Right:Yes] Electronic Signature(s) Signed: 11/12/2021 4:54:31 PM By: Baruch Gouty RN, BSN Entered By: Baruch Gouty on 11/12/2021 10:41:03 Multi Wound Chart Details -------------------------------------------------------------------------------- Haskell Flirt [name] ([id number]) [id number] Page 3 of 7 Patient Name: Date of Service: [name], BORUNDA [name]. 11/12/2021 10:30 A M Medical Record Number: [id number] Patient Account Number: [id number] Date of Birth/Sex: Treating RN: Dec 09, 1945 (76 y.o. F) Primary Care Georjean Toya: Domingo Mend Other Clinician: Referring Levina Boyack: Treating Hurshel Bouillon/Extender: Gevena Cotton Weeks in Treatment: 7 Vital Signs Height(in):  60 Pulse(bpm): 85 Weight(lbs): 101 Blood Pressure(mmHg): 170/84 Body Mass Index(BMI): 19.7 Temperature(F): 97.6 Respiratory Rate(breaths/min): 20 Wound Assessments Wound Number: 2 N/A N/A Photos: N/A N/A Right, Lateral Lower Leg N/A N/A Wound Location: Surgical Injury N/A N/A Wounding Event: Open Surgical Wound N/A N/A Primary Etiology: Venous Leg Ulcer N/A N/A Secondary Etiology: Cataracts, Chronic sinus N/A N/A Comorbid History: problems/congestion, Hypertension, Crohns, Osteoarthritis 08/17/2021 N/A N/A Date Acquired: 7 N/A N/A Weeks of Treatment: Open N/A N/A Wound Status: No N/A N/A Wound Recurrence: Yes N/A N/A Clustered Wound: 0.8x0.8x0.1 N/A N/A Measurements L x W x D (cm) 0.503 N/A N/A A (cm) : rea 0.05 N/A N/A Volume (cm) : 97.20% N/A N/A % Reduction in Area: 97.20% N/A N/A % Reduction in Volume: Full Thickness Without Exposed N/A N/A Classification: Support Structures Medium N/A N/A Exudate Amount: Serosanguineous N/A N/A Exudate Type: red, brown N/A N/A Exudate Color: Distinct, outline attached N/A N/A Wound Margin: Large (67-100%) N/A N/A Granulation  Amount: Red N/A N/A Granulation Quality: None Present (0%) N/A N/A Necrotic Amount: Fat Layer (Subcutaneous Tissue): Yes N/A N/A Exposed Structures: Fascia: No Tendon: No Muscle: No Joint: No Bone: No Medium (34-66%) N/A N/A Epithelialization: Excoriation: No N/A N/A Periwound Skin Texture: Induration: No Callus: No Crepitus: No Rash: No Scarring: No Dry/Scaly: No N/A N/A Periwound Skin Moisture: Atrophie Blanche: No N/A N/A Periwound Skin Color: Cyanosis: No Ecchymosis: No Erythema: No Hemosiderin Staining: No Mottled: No Pallor: No Rubor: No No Abnormality N/A N/A Temperature: Compression Therapy N/A N/A Procedures Performed: Treatment Notes Electronic Signature(s) Signed: 11/12/2021 11:16:01 AM By: [name] MD FACS Maese, Shirlee 11/12/2021 11:16:01 AM By: [name] MD FACS Signed: I ([id number]) [id number] Page 4 of 7 Entered By: [name] on 11/12/2021 11:16:01 -------------------------------------------------------------------------------- Multi-Disciplinary Care Plan Details Patient Name: Date of Service: [name], VILLA 11/12/2021 10:30 A M Medical Record Number: [id number] Patient Account Number: [id number] Date of Birth/Sex: Treating RN: [birth year] (76 y.o. [name] Primary Care Lashelle Koy: Domingo Mend Other Clinician: Referring Toshi Ishii: Treating Lehua Flores/Extender: Caprice Kluver in Treatment: 7 [place] reviewed with physician Active Inactive Wound/Skin Impairment Nursing Diagnoses: Impaired tissue integrity Knowledge deficit related to ulceration/compromised skin integrity Goals: Patient/caregiver will verbalize understanding of skin care regimen Date Initiated: 09/20/2021 Target Resolution Date: 12/06/2021 Goal Status: Active Ulcer/skin breakdown will have a volume reduction of 30% by week 4 Date Initiated: 09/20/2021 Target  Resolution Date: 12/06/2021 Goal Status: Active Interventions: Assess patient/caregiver ability to obtain necessary supplies Assess patient/caregiver ability to perform ulcer/skin care regimen upon admission and as needed Assess ulceration(s) every visit Provide education on ulcer and skin care Treatment Activities: Skin care regimen initiated : 09/20/2021 Topical wound management initiated : 09/20/2021 Notes: Electronic Signature(s) Signed: 11/12/2021 4:54:31 PM By: Baruch Gouty RN, BSN Entered By: Baruch Gouty on 11/12/2021 10:47:52 -------------------------------------------------------------------------------- Pain Assessment Details Patient Name: Date of Service: [name]. 11/12/2021 10:30 A M Medical Record Number: [id number] Patient Account Number: [id number] Date of Birth/Sex: Treating RN: March 06, 1945 (76 y.o. F) Primary Care Junella Domke: Domingo Mend Other Clinician: Referring Darius Lundberg: Treating Daphene Chisholm/Extender: Gevena Cotton Weeks in Treatment: 7 Active Problems Location of Pain Severity and Description of Pain Patient Has Paino No DORCUS, RIGA [name] ([id number]) [id number] Page 5 of 7 Patient Has Paino No Site Locations Pain Management and Medication Current Pain Management: Electronic Signature(s) Signed: 11/12/2021 2:31:35 PM By: Worthy Rancher Entered By: Worthy Rancher on 11/12/2021 10:30:56 -------------------------------------------------------------------------------- Patient/Caregiver Education Details  Patient Name: Date of Service: [name], PYTEL [name]. 11/6/2023andnbsp10:30 [place] Record Number: [id number] Patient Account Number: [id number] Date of Birth/Gender: Treating RN: [birth year] (76 y.o. [name] Primary Care Physician: Domingo Mend Other Clinician: Referring Physician: Treating Physician/Extender: Caprice Kluver in Treatment: 7 Education  Assessment Education Provided To: Patient Education Topics Provided Venous: Methods: Explain/Verbal Responses: Reinforcements needed, State content correctly Electronic Signature(s) Signed: 11/12/2021 4:54:31 PM By: Baruch Gouty RN, BSN Entered By: Baruch Gouty on 11/12/2021 10:48:24 -------------------------------------------------------------------------------- Wound Assessment Details Patient Name: Date of Service: [name]. 11/12/2021 10:30 A M Medical Record Number: [id number] Patient Account Number: [id number] Date of Birth/Sex: Treating RN: Aug 20, 1945 (76 y.o. [address], [place], [place] [name] ([id number]) [id number] Page 6 of 7 Primary Care Tareek Sabo: Domingo Mend Other Clinician: Referring Kaysee Hergert: Treating Nieves Barberi/Extender: Gevena Cotton Weeks in Treatment: 7 Wound Status Wound Number: 2 Primary Open Surgical Wound Etiology: Wound Location: Right, Lateral Lower Leg Secondary Venous Leg Ulcer Wounding Event: Surgical Injury Etiology: Date Acquired: 08/17/2021 Wound Status: Open Weeks Of Treatment: 7 Comorbid Cataracts, Chronic sinus problems/congestion, Hypertension, Clustered Wound: Yes History: Crohns, Osteoarthritis Photos Wound Measurements Length: (cm) Width: (cm) Depth: (cm) Area: (cm) Volume: (cm) 0.8 % Reduction in Area: 97.2% 0.8 % Reduction in Volume: 97.2% 0.1 Epithelialization: Medium (34-66%) 0.503 Tunneling: No 0.05 Undermining: No Wound Description Classification: Full Thickness Without Exposed Suppor Wound Margin: Distinct, outline attached Exudate Amount: Medium Exudate Type: Serosanguineous Exudate Color: red, brown t Structures Foul Odor After Cleansing: No Slough/Fibrino No Wound Bed Granulation Amount: Large (67-100%) Exposed Structure Granulation Quality: Red Fascia Exposed: No Necrotic Amount: None Present (0%) Fat Layer (Subcutaneous Tissue) Exposed:  Yes Tendon Exposed: No Muscle Exposed: No Joint Exposed: No Bone Exposed: No Periwound Skin Texture Texture Color No Abnormalities Noted: Yes No Abnormalities Noted: Yes Moisture Temperature / Pain No Abnormalities Noted: Yes Temperature: No Abnormality Treatment Notes Wound #2 (Lower Leg) Wound Laterality: Right, Lateral Cleanser Peri-Wound Care Sween Lotion (Moisturizing lotion) Discharge Instruction: Apply moisturizing lotion as directed Topical Primary Dressing Hydrofera Blue Ready Foam, 2.5 x2.5 in Discharge Instruction: Apply to wound bed as instructed Secondary Dressing Woven Gauze Sponge, Non-Sterile 4x4 in LUVENIA, CRANFORD [name] ([id number]) [id number] Page 7 of 7 Discharge Instruction: Apply over primary dressing as directed. Secured With Compression Wrap ThreePress (3 layer compression wrap) Discharge Instruction: Apply three layer compression as directed. Compression Stockings Add-Ons Electronic Signature(s) Signed: 11/12/2021 4:54:31 PM By: Baruch Gouty RN, BSN Entered By: Baruch Gouty on 11/12/2021 10:44:53 -------------------------------------------------------------------------------- Vitals Details Patient Name: Date of Service: [name]. 11/12/2021 10:30 A M Medical Record Number: [id number] Patient Account Number: [id number] Date of Birth/Sex: Treating RN: [birth year] (76 y.o. F) Primary Care Imogean Ciampa: Domingo Mend Other Clinician: Referring Shelbe Haglund: Treating Leevon Upperman/Extender: Gevena Cotton Weeks in Treatment: 7 Vital Signs Time Taken: 10:30 Temperature (F): 97.6 Height (in): 60 Pulse (bpm): 85 Weight (lbs): 101 Respiratory Rate (breaths/min): 20 Body Mass Index (BMI): 19.7 Blood Pressure (mmHg): 170/84 Reference Range: 80 - 120 mg / dl Electronic Signature(s) Signed: 11/12/2021 2:31:35 PM By: Worthy Rancher Entered By: Worthy Rancher on 11/12/2021 10:30:50

## 2021-11-15 ENCOUNTER — Ambulatory Visit (HOSPITAL_BASED_OUTPATIENT_CLINIC_OR_DEPARTMENT_OTHER): Payer: Medicare Other | Admitting: General Surgery

## 2021-11-15 ENCOUNTER — Telehealth: Payer: Self-pay | Admitting: Licensed Clinical Social Worker

## 2021-11-15 NOTE — Patient Outreach (Signed)
  Care Coordination   Initial Visit Note   11/15/2021 Name: DAY GREB MRN: [id number] DOB: [birth year]  Norva Riffle is a 76 y.o. year old female who sees Isaac Bliss, Rayford Halsted, MD for primary care. I spoke with  Norva Riffle by phone today.  What matters to the patients health and wellness today?  Care Coordination    Goals Addressed             This Visit's Progress    COMPLETED: Care Coordination Activities-No Follow Up Required       Care Coordination Interventions: Active listening / Reflection utilized  Emotional Support Provided LCSW inquired about how pt was feeling after sustaining an injury from fall 11/05. Patient reports she is feeling, "just fine" Pt received tx at ED LCSW informed patient of care coordination services. Pt is not interested at this time and agreed to contact PCP, should needs arise  LCSW reviewed upcoming appts. Pt is scheduled to visit with PCP on 11/20/21         SDOH assessments and interventions completed:  No     Care Coordination Interventions Activated:  Yes  Care Coordination Interventions:  Yes, provided   Follow up plan: No further intervention required.   Encounter Outcome:  Pt. Refused   Christa See, [email or web address] Phone [phone] 1:52 PM

## 2021-11-15 NOTE — Patient Instructions (Signed)
Visit Information  Thank you for taking time to visit with me today. Please don't hesitate to contact me if I can be of assistance to you.   Following are the goals we discussed today:   Goals Addressed             This Visit's Progress    COMPLETED: Care Coordination Activities-No Follow Up Required       Care Coordination Interventions: Active listening / Reflection utilized  Emotional Support Provided LCSW inquired about how pt was feeling after sustaining an injury from fall 11/05. Patient reports she is feeling, "just fine" Pt received tx at ED LCSW informed patient of care coordination services. Pt is not interested at this time and agreed to contact PCP, should needs arise  LCSW reviewed upcoming appts. Pt is scheduled to visit with PCP on 11/20/21        If you are experiencing a Mental Health or [place] or need someone to talk to, please call the Suicide and Crisis Lifeline: 988 call 911   Patient verbalizes understanding of instructions and care plan provided today and agrees to view in [place]. Active MyChart status and patient understanding of how to access instructions and care plan via MyChart confirmed with patient.     No further follow up required:    Christa See, MSW, [email or web address] Phone [phone] 1:57 PM

## 2021-11-19 ENCOUNTER — Encounter (HOSPITAL_BASED_OUTPATIENT_CLINIC_OR_DEPARTMENT_OTHER): Payer: Medicare Other | Admitting: General Surgery

## 2021-11-19 DIAGNOSIS — L97812 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d: Secondary | ICD-10-CM | POA: Diagnosis not present

## 2021-11-19 NOTE — Progress Notes (Signed)
AVERLY, [name] ([id number]) [id number] Page 1 of 8 Visit Report for [name] Arrival Information Details Patient Name: Date of Service: [name], [name]. [name] 10:30 A M Medical Record Number: [id number] Patient Account Number: [id number] Date of Birth/Sex: Treating RN: Nov 07, 1945 (76 y.o. F) Primary Care [name]: [name] Other Clinician: Referring [name]: Treating [name]/Extender: [name] in Treatment: 8 Visit Information History Since Last Visit All ordered tests and consults were completed: No Patient Arrived: Ambulatory Added or deleted any medications: No Arrival Time: 10:32 Any new allergies or adverse reactions: No Accompanied [name]: husband Had a fall or experienced change in No Transfer Assistance: None activities of daily living that may affect Patient Identification Verified: Yes risk of falls: Secondary Verification Process Completed: Yes Signs or symptoms of abuse/neglect since last visito No Patient Requires Transmission-Based Precautions: No Hospitalized since last visit: No Patient Has Alerts: No Implantable device outside of the clinic excluding No cellular tissue based products placed in the center since last visit: Pain Present Now: No Electronic Signature(s) Signed: 11/19/2021 4:34:06 [name] [name]: [name] Entered [name]: [name] on [name] [name] -------------------------------------------------------------------------------- Clinic Level of Care Assessment Details Patient Name: Date of Service: [name], [name]. [name] 10:30 A M Medical Record Number: [id number] Patient Account Number: [id number] Date of Birth/Sex: Treating RN: [birth year] (76 y.o. [name] Primary Care [name]: [name] Other Clinician: Referring [name]: Treating [name]/Extender: [name] in Treatment: 8 Clinic Level of Care Assessment  Items TOOL 4 Quantity Score []  - 0 Use when only an EandM is performed on FOLLOW-UP visit ASSESSMENTS - Nursing Assessment / Reassessment X- 1 10 Reassessment of Co-morbidities (includes updates in patient status) X- 1 5 Reassessment of Adherence to Treatment Plan ASSESSMENTS - Wound and Skin A ssessment / Reassessment X - Simple Wound Assessment / Reassessment - one wound 1 5 []  - 0 Complex Wound Assessment / Reassessment - multiple wounds []  - 0 Dermatologic / Skin Assessment (not related to wound area) ASSESSMENTS - Focused Assessment []  - 0 Circumferential Edema Measurements - multi extremities []  - 0 Nutritional Assessment / Counseling / Intervention [name], [name] ([id number]) [id number] Page 2 of 8 X- 1 5 Lower Extremity Assessment (monofilament, tuning fork, pulses) []  - 0 Peripheral Arterial Disease Assessment (using hand held doppler) ASSESSMENTS - Ostomy and/or Continence Assessment and Care []  - 0 Incontinence Assessment and Management []  - 0 Ostomy Care Assessment and Management (repouching, etc.) PROCESS - Coordination of Care X - Simple Patient / Family Education for ongoing care 1 15 []  - 0 Complex (extensive) Patient / Family Education for ongoing care X- 1 10 Staff obtains Programmer, systems, Records, T Results / Process Orders est []  - 0 Staff telephones HHA, Nursing Homes / Clarify orders / etc []  - 0 Routine Transfer to another Facility (non-emergent condition) []  - 0 Routine Hospital Admission (non-emergent condition) []  - 0 New Admissions / Biomedical engineer / Ordering NPWT Apligraf, etc. , []  - 0 Emergency Hospital Admission (emergent condition) X- 1 10 Simple Discharge Coordination []  - 0 Complex (extensive) Discharge Coordination PROCESS - Special Needs []  - 0 Pediatric / Minor Patient Management []  - 0 Isolation Patient Management []  - 0 Hearing / Language / Visual special needs []  - 0 Assessment of  Community assistance (transportation, D/C planning, etc.) []  - 0 Additional assistance / Altered mentation []  - 0 Support Surface(s) Assessment (bed, cushion, seat, etc.) INTERVENTIONS - Wound Cleansing / Measurement  X - Simple Wound Cleansing - one wound 1 5 []  - 0 Complex Wound Cleansing - multiple wounds X- 1 5 Wound Imaging (photographs - any number of wounds) []  - 0 Wound Tracing (instead of photographs) []  - 0 Simple Wound Measurement - one wound []  - 0 Complex Wound Measurement - multiple wounds INTERVENTIONS - Wound Dressings X - Small Wound Dressing one or multiple wounds 1 10 []  - 0 Medium Wound Dressing one or multiple wounds []  - 0 Large Wound Dressing one or multiple wounds []  - 0 Application of Medications - topical []  - 0 Application of Medications - injection INTERVENTIONS - Miscellaneous []  - 0 External ear exam []  - 0 Specimen Collection (cultures, biopsies, blood, body fluids, etc.) []  - 0 Specimen(s) / Culture(s) sent or taken to Lab for analysis []  - 0 Patient Transfer (multiple staff / Civil Service fast streamer / Similar devices) []  - 0 Simple Staple / Suture removal (25 or less) []  - 0 Complex Staple / Suture removal (26 or more) []  - 0 Hypo / Hyperglycemic Management (close monitor of Blood Glucose) [name], [name] ([id number]) [id number] Page 3 of 8 []  - 0 Ankle / Brachial Index (ABI) - do not check if billed separately X- 1 5 Vital Signs Has the patient been seen at the hospital within the last three years: Yes Total Score: 85 Level Of Care: New/Established - Level 3 Electronic Signature(s) Signed: 11/19/2021 12:51:09 [name] [name]: April Gouty RN, BSN Entered [name]: [name] on [name] 11:01:44 -------------------------------------------------------------------------------- Encounter Discharge Information Details Patient Name: Date of Service: [name]. [name] 10:30 A M Medical Record Number:  [id number] Patient Account Number: [id number] Date of Birth/Sex: Treating RN: December 28, 1945 (76 y.o. [name] Primary Care [name]: [name] Other Clinician: Referring [name]: Treating [name]/Extender: [name] in Treatment: 8 Encounter Discharge Information Items Discharge Condition: Stable Ambulatory Status: Ambulatory Discharge Destination: Home Transportation: Private Auto Accompanied [name]: spouse Schedule Follow-up Appointment: Yes Clinical Summary of Care: Patient Declined Electronic Signature(s) Signed: 11/19/2021 12:51:09 [name] [name]: April Gouty RN, BSN Entered [name]: [name] on [name] 11:03:00 -------------------------------------------------------------------------------- Lower Extremity Assessment Details Patient Name: Date of Service: [name]. [name] 10:30 A M Medical Record Number: [id number] Patient Account Number: [id number] Date of Birth/Sex: Treating RN: 09-11-1945 (76 y.o. [name] Primary Care Daneli Butkiewicz: [name] Other Clinician: Referring Areal Cochrane: Treating Dyneshia Baccam/Extender: [name] in Treatment: 8 Edema Assessment Assessed: [Left: No] [Right: No] Edema: [Left: N] [Right: o] Calf Left: Right: Point of Measurement: From Medial Instep 27.5 cm Ankle Left: Right: Point of Measurement: From Medial Instep 19.4 cm Vascular Assessment Hershkowitz, Savvy [name] ([id number]) [Right:[id number] Page 4 of 8] Pulses: Dorsalis Pedis Palpable: [Right:Yes] Electronic Signature(s) Signed: 11/19/2021 12:51:09 [name] [name]: April Gouty RN, BSN Entered [name]: [name] on [name] 10:48:40 -------------------------------------------------------------------------------- Multi Wound Chart Details Patient Name: Date of Service: [name]. [name] 10:30 A M Medical Record Number: [id number] Patient Account Number:  [id number] Date of Birth/Sex: Treating RN: [birth year] (76 y.o. F) Primary Care Chandlor Noecker: [name] Other Clinician: Referring Cheyrl Buley: Treating Mariela Rex/Extender: [name] in Treatment: 8 Vital Signs Height(in): 60 Pulse(bpm): 90 Weight(lbs): 101 Blood Pressure(mmHg): 177/50 Body Mass Index(BMI): 19.7 Temperature(F): 97.9 Respiratory Rate(breaths/min): 20 [2:Photos:] [N/A:N/A] Right, Lateral Lower Leg N/A N/A Wound Location: Surgical Injury N/A N/A Wounding Event: Open Surgical Wound N/A N/A Primary Etiology: Venous Leg Ulcer N/A N/A Secondary Etiology:  Cataracts, Chronic sinus N/A N/A Comorbid History: problems/congestion, Hypertension, Crohns, Osteoarthritis 08/17/2021 N/A N/A Date Acquired: 8 N/A N/A [name] of Treatment: Healed - Epithelialized N/A N/A Wound Status: No N/A N/A Wound Recurrence: Yes N/A N/A Clustered Wound: 0x0x0 N/A N/A Measurements L x W x D (cm) 0 N/A N/A A (cm) : rea 0 N/A N/A Volume (cm) : 100.00% N/A N/A % Reduction in Area: 100.00% N/A N/A % Reduction in Volume: Full Thickness Without Exposed N/A N/A Classification: Support Structures None Present N/A N/A Exudate Amount: None Present (0%) N/A N/A Granulation Amount: None Present (0%) N/A N/A Necrotic Amount: Fascia: No N/A N/A Exposed Structures: Fat Layer (Subcutaneous Tissue): No Tendon: No Muscle: No Joint: No Bone: No Large (67-100%) N/A N/A Epithelialization: Excoriation: No N/A N/A Periwound Skin Texture: Induration: No Callus: No Crepitus: No Rash: No Scarring: No [name], [name] ([id number]) [id number] Page 5 of 8 Dry/Scaly: No N/A N/A Periwound Skin Moisture: Atrophie Blanche: No N/A N/A Periwound Skin Color: Cyanosis: No Ecchymosis: No Erythema: No Hemosiderin Staining: No Mottled: No Pallor: No Rubor: No No Abnormality N/A N/A Temperature: Treatment Notes Electronic  Signature(s) Signed: 11/19/2021 10:56:20 AM [name]: [name] MD FACS Entered [name]: [name] on [name] 10:56:19 -------------------------------------------------------------------------------- Multi-Disciplinary Care Plan Details Patient Name: Date of Service: [name]. [name] 10:30 A M Medical Record Number: [id number] Patient Account Number: [id number] Date of Birth/Sex: Treating RN: 1945/05/21 (76 y.o. [name] Primary Care Maire Govan: [name] Other Clinician: Referring Lacorey Brusca: Treating Antoneo Ghrist/Extender: [name] in Treatment: 8 Multidisciplinary Care Plan reviewed with physician Active Inactive Electronic Signature(s) Signed: 11/19/2021 12:51:09 [name] [name]: April Gouty RN, BSN Entered [name]: [name] on [name] 10:52:52 -------------------------------------------------------------------------------- Pain Assessment Details Patient Name: Date of Service: [name]. [name] 10:30 A M Medical Record Number: [id number] Patient Account Number: [id number] Date of Birth/Sex: Treating RN: [birth year] (76 y.o. F) Primary Care Deisy Ozbun: [name] Other Clinician: Referring Faithlyn Recktenwald: Treating Khloe Hunkele/Extender: [name] in Treatment: 8 Active Problems Location of Pain Severity and Description of Pain Patient Has Paino No Site Locations [name], [name] ([id number]) [id number] Page 6 of 8 Pain Management and Medication Current Pain Management: Electronic Signature(s) Signed: 11/19/2021 4:34:06 [name] [name]: [name] Entered [name]: [name] on [name] 10:34:12 -------------------------------------------------------------------------------- Patient/Caregiver Education Details Patient Name: Date of Service: [name]. 11/13/2023andnbsp10:30 A M Medical Record Number: [id number] Patient Account Number: [id number] Date  of Birth/Gender: Treating RN: [birth year] (76 y.o. [name] Primary Care Physician: [name] Other Clinician: Referring Physician: Treating Physician/Extender: [name] in Treatment: 8 Education Assessment Education Provided To: Patient Education Topics Provided Wound/Skin Impairment: Methods: Explain/Verbal Responses: Reinforcements needed, State content correctly Electronic Signature(s) Signed: 11/19/2021 12:51:09 [name] [name]: April Gouty RN, BSN Entered [name]: [name] on [name] 10:53:15 -------------------------------------------------------------------------------- Wound Assessment Details Patient Name: Date of Service: [name]. [name] 10:30 A M Medical Record Number: [id number] Patient Account Number: [id number] Date of Birth/Sex: Treating RN: 1945/08/01 (76 y.o. [name] Primary Care Jayzon Taras: [name] Other Clinician: Referring Demarie Uhlig: Treating Adib Wahba/Extender: [name], [name] ([id number]) [id number] Page 7 of 8 [name] in Treatment: 8 Wound Status Wound Number: 2 Primary Open Surgical Wound Etiology: Wound Location: Right, Lateral Lower Leg Secondary Venous Leg Ulcer Wounding Event: Surgical Injury Etiology: Date Acquired: 08/17/2021 Wound Status: Healed - Epithelialized [name] Of Treatment: 8 Comorbid  Cataracts, Chronic sinus problems/congestion, Hypertension, Clustered Wound: Yes History: Crohns, Osteoarthritis Photos Wound Measurements Length: (cm) Width: (cm) Depth: (cm) Area: (cm) Volume: (cm) 0 % Reduction in Area: 100% 0 % Reduction in Volume: 100% 0 Epithelialization: Large (67-100%) 0 Tunneling: No 0 Undermining: No Wound Description Classification: Full Thickness Without Exposed Support Structures Exudate Amount: None Present Foul Odor After Cleansing: No Slough/Fibrino No Wound  Bed Granulation Amount: None Present (0%) Exposed Structure Necrotic Amount: None Present (0%) Fascia Exposed: No Fat Layer (Subcutaneous Tissue) Exposed: No Tendon Exposed: No Muscle Exposed: No Joint Exposed: No Bone Exposed: No Periwound Skin Texture Texture Color No Abnormalities Noted: Yes No Abnormalities Noted: Yes Moisture Temperature / Pain No Abnormalities Noted: Yes Temperature: No Abnormality Electronic Signature(s) Signed: 11/19/2021 12:51:09 [name] [name]: April Gouty RN, BSN Entered [name]: [name] on [name] 10:51:53 -------------------------------------------------------------------------------- [name] Details Patient Name: Date of Service: [name]. [name] 10:30 A M Medical Record Number: [id number] Patient Account Number: [id number] Date of Birth/Sex: Treating RN: 1945-11-24 (76 y.o. F) Primary Care Leveon Pelzer: [name] Other Clinician: Referring Mallori Araque: Treating Rooney Gladwin/Extender: [name] in Treatment: 24 [name], [name] ([id number]) [id number] Page 8 of 8 Vital Signs Time Taken: 10:30 Temperature (F): 97.9 Height (in): 60 Pulse (bpm): 90 Weight (lbs): 101 Respiratory Rate (breaths/min): 20 Body Mass Index (BMI): 19.7 Blood Pressure (mmHg): 177/50 Reference Range: 80 - 120 mg / dl Electronic Signature(s) Signed: 11/19/2021 4:34:06 [name] [name]: [name] Entered [name]: [name] on [name] 10:33:49

## 2021-11-19 NOTE — Progress Notes (Addendum)
[name] [name] ([id number]) [id number] Page 1 of 9 Visit Report for 11/19/2021 Chief Complaint Document Details Patient Name: Date of Service: [name] [name]. 11/19/2021 10:30 A M Medical Record Number: [id number] Patient Account Number: [id number] Date of Birth/Sex: Treating RN: 08/10/1945 (76 y.o. F) Primary Care Provider: Domingo Mend Other Clinician: Referring Provider: Treating Provider/Extender: Caprice Kluver in Treatment: 8 Information Obtained from: Patient Chief Complaint 11/15/2020; patient is here for review of a wound area on the right posterior calf 09/20/2021: Patient is here for a nonhealing wound after undergoing Mohs surgery on her right lower leg. Electronic Signature(s) Signed: 11/19/2021 10:56:36 AM By: [name] MD FACS Entered By: [name] on 11/19/2021 10:56:35 -------------------------------------------------------------------------------- HPI Details Patient Name: Date of Service: [name]. 11/19/2021 10:30 A M Medical Record Number: [id number] Patient Account Number: [id number] Date of Birth/Sex: Treating RN: [birth year] (76 y.o. F) Primary Care Provider: Domingo Mend Other Clinician: Referring Provider: Treating Provider/Extender: Gevena Cotton Weeks in Treatment: 8 History of Present Illness HPI Description: ADMISSION 11/15/2020 This is a 76 year old woman with a history of squamous cell cancer skin cancer followed by Dr. Anabel Bene dermatology. She describes having a wound on the right posterior calf dating back since September. She saw her primary doctor in early October who suggested petroleum infused gauze. The patient has been doing this. No real improvement in fact she says this is getting larger. She does not complain much of pain. She is completely uncertain about how this happened there was no trauma she simply became aware of it at  some point describing it is about the size of a quarter but it has been getting progressively larger since then. She is here for review of this. Past medical history Crohn's disease not currently on prednisone, gastroesophageal reflux disease, history of squamous cell skin cancer, history of rheumatoid arthritis, hypertension 11/16; this lady had a large superficial atypical wound on the right posterior calf. She has significant solar skin damage probably some degree of venous insufficiency but she described a rapidly expanding wound area. Because of this and a history of skin cancer [name] went ahead and did a shave biopsy but we do not have the results of this at the time we saw her in clinic today. She has been using silver alginate and kerlix 11/30; shave biopsy [name] did last time did not show any malignancy or fungal elements. Suggestion of a stasis ulcer. With our treatment which is simply been silver alginate with kerlix that she is changing daily the wound is down dramatically almost surprising how rapidly it is reduced in surface area 12/14; patient comes in today with her wound healed this was on the right posterior calf. She has significant solar skin damage probably some degree of venous insufficiency however she described a rapidly expanding wound area on admission. Because of this and a history of skin cancer [name] did do a shave biopsy but nothing was really found of worry. 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 Unfortunately, the wound has failed to heal. There is thick slough on the wound surface. The periwound skin is in reasonably good condition. There is granulation tissue forming underneath the slough. 09/28/2021: The wound has  deteriorated rather significantly over the past week. She had fairly copious drainage that she noticed as early as Monday, but she [name], [name] ([id number]) [id number] Page 2 of 9 did not contact our office. Today there is a lot of periwound skin breakdown and a very pungent odor coming from the wound. There is thick slough on the wound surface but there is still good granulation tissue underlying this. Despite this, however, the wound actually measures smaller. 10/05/2021: The culture that [name] took grew out MRSA. [name] prescribed doxycycline and she is still taking this. There has been a tremendous improvement in the wound. It is smaller and has contracted considerably. There is hypertrophic granulation tissue present. No malodorous drainage. 10/16/2021: The wound has contracted further and is epithelializing. The surface is very clean and the hypertrophic granulation tissue has not recurred. She completed her course of doxycycline. 10/19; surgical wound on the right medial lower extremity secondary to Mohs surgery done in August. She is using Hydrofera Blue under 3 layer compression and making excellent progress. 11/02/2021: The wound is nearly completely epithelialized. Just a couple of open areas with some eschar overlying the surface. 11/12/2021: Just 1 small open area remains. It is clean without any slough or eschar accumulation. No hypertrophic granulation tissue present. 11/19/2021: Her wound is healed. Electronic Signature(s) Signed: 11/19/2021 10:57:02 AM By: [name] MD FACS Entered By: [name] on 11/19/2021 10:57:02 -------------------------------------------------------------------------------- Physical Exam Details Patient Name: Date of Service: [name]. 11/19/2021 10:30 A M Medical Record Number: [id number] Patient Account Number: [id number] Date of Birth/Sex: Treating RN: August 21, 1945 (76 y.o. F) Primary Care Provider: Domingo Mend Other Clinician: Referring Provider: Treating Provider/Extender: Gevena Cotton Weeks in Treatment: 8 Constitutional Hypertensive, asymptomatic. . . . No acute distress.Marland Kitchen Respiratory Normal work of breathing on room air.. Notes 11/19/2021: Her wound is healed. Electronic Signature(s) Signed: 11/19/2021 10:57:42 AM By: [name] MD FACS Entered By: [name] on 11/19/2021 10:57:42 -------------------------------------------------------------------------------- Physician Orders Details Patient Name: Date of Service: [name]. 11/19/2021 10:30 A M Medical Record Number: [id number] Patient Account Number: [id number] Date of Birth/Sex: Treating RN: 08-16-1945 (76 y.o. [name] Primary Care Provider: Domingo Mend Other Clinician: Referring Provider: Treating Provider/Extender: Gevena Cotton Weeks in Treatment: 8 Verbal / Phone Orders: No Diagnosis Coding ICD-10 Coding Code Description [phone] 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d T81.31XS Disruption of external operation (surgical) wound, not elsewhere classified, sequela [name], [name] ([id number]) [id number] Page 3 of 9 I10 Essential (primary) hypertension C44.722 Squamous cell carcinoma of skin of right lower limb, including hip K50.90 Crohn's disease, unspecified, without complications U27 Unspecified severe protein-calorie malnutrition Discharge From [facility] Discharge from [place] Bathing/ Shower/ Hygiene May shower and wash wound with soap and water. Edema Control - Lymphedema / SCD / Other Elevate legs to the level of the heart or above for 30 minutes daily and/or when sitting, a frequency of: Avoid standing for long periods of time. Exercise regularly Moisturize legs daily. Non Wound Condition Protect area with: - healed area with bandaid for 1  more week to protect Electronic Signature(s) Signed: 11/19/2021 11:27:35 AM By: [name] MD FACS Signed: 11/19/2021 12:51:09 PM By: Baruch Gouty RN, BSN Previous Signature: 11/19/2021 10:59:09 AM Version By: [name] MD FACS Entered By: Baruch Gouty on 11/19/2021 10:59:02 -------------------------------------------------------------------------------- Problem List Details Patient Name: Date of Service: April [place]  [name]. 11/19/2021 10:30 A M Medical Record Number: [id number] Patient Account Number: [id number] Date of Birth/Sex: Treating RN: [birth year] (76 y.o. [name] Primary Care Provider: Domingo Mend Other Clinician: Referring Provider: Treating Provider/Extender: Gevena Cotton Weeks in Treatment: 8 Active Problems ICD-10 Encounter Code Description Active Date MDM Diagnosis [phone] Non-pressure chronic ulcer of other part of right lower leg with fat layer 09/20/2021 No Yes exposed T81.31XS Disruption of external operation (surgical) wound, not elsewhere classified, 09/20/2021 No Yes sequela I10 Essential (primary) hypertension 09/20/2021 No Yes C44.722 Squamous cell carcinoma of skin of right lower limb, including hip 09/20/2021 No Yes K50.90 Crohn's disease, unspecified, without complications 0/25/8527 No Yes E43 Unspecified severe protein-calorie malnutrition 09/20/2021 No Yes [name], [name] ([id number]) [id number] Page 4 of 9 Inactive Problems Resolved Problems Electronic Signature(s) Signed: 11/19/2021 10:56:13 AM By: [name] MD FACS Entered By: [name] on 11/19/2021 10:56:13 -------------------------------------------------------------------------------- Progress Note Details Patient Name: Date of Service: [name]. 11/19/2021 10:30 A M Medical Record Number: [id number] Patient Account Number: [id number] Date of Birth/Sex: Treating RN: [birth year] (76 y.o.  F) Primary Care Provider: Domingo Mend Other Clinician: Referring Provider: Treating Provider/Extender: Caprice Kluver in Treatment: 8 Subjective Chief Complaint Information obtained from Patient 11/15/2020; patient is here for review of a wound area on the right posterior calf 09/20/2021: Patient is here for a nonhealing wound after undergoing Mohs surgery on her right lower leg. History of Present Illness (HPI) ADMISSION 11/15/2020 This is a 76 year old woman with a history of squamous cell cancer skin cancer followed by Dr. Anabel Bene dermatology. She describes having a wound on the right posterior calf dating back since September. She saw her primary doctor in early October who suggested petroleum infused gauze. The patient has been doing this. No real improvement in fact she says this is getting larger. She does not complain much of pain. She is completely uncertain about how this happened there was no trauma she simply became aware of it at some point describing it is about the size of a quarter but it has been getting progressively larger since then. She is here for review of this. Past medical history Crohn's disease not currently on prednisone, gastroesophageal reflux disease, history of squamous cell skin cancer, history of rheumatoid arthritis, hypertension 11/16; this lady had a large superficial atypical wound on the right posterior calf. She has significant solar skin damage probably some degree of venous insufficiency but she described a rapidly expanding wound area. Because of this and a history of skin cancer [name] went ahead and did a shave biopsy but we do not have the results of this at the time we saw her in clinic today. She has been using silver alginate and kerlix 11/30; shave biopsy [name] did last time did not show any malignancy or fungal elements. Suggestion of a stasis ulcer. With our treatment which is simply been silver alginate with kerlix  that she is changing daily the wound is down dramatically almost surprising how rapidly it is reduced in surface area 12/14; patient comes in today with her wound healed this was on the right posterior calf. She has significant solar skin damage probably some degree of venous insufficiency however she described a rapidly expanding wound area on admission. Because of this and a history of skin cancer [name] did do a shave biopsy but nothing was really found of worry. She tells me that she had biopsies and her gastroenterologist is now  saying she has "colitis" rather than Crohn's disease and she is now on steroids orally READMISSION 09/20/2021 She returns to clinic today having undergone a Mohs procedure on her right anterior tibial surface on August 17, 2021. Unfortunately, the wound has failed to heal. There is thick slough on the wound surface. The periwound skin is in reasonably good condition. There is granulation tissue forming underneath the slough. 09/28/2021: The wound has deteriorated rather significantly over the past week. She had fairly copious drainage that she noticed as early as Monday, but she did not contact our office. Today there is a lot of periwound skin breakdown and a very pungent odor coming from the wound. There is thick slough on the wound surface but there is still good granulation tissue underlying this. Despite this, however, the wound actually measures smaller. 10/05/2021: The culture that [name] took grew out MRSA. [name] prescribed doxycycline and she is still taking this. There has been a tremendous improvement in the wound. It is smaller and has contracted considerably. There is hypertrophic granulation tissue present. No malodorous drainage. 10/16/2021: The wound has contracted further and is epithelializing. The surface is very clean and the hypertrophic granulation tissue has not recurred. She completed her course of doxycycline. 10/19; surgical wound on the right medial lower  extremity secondary to Mohs surgery done in August. She is using Hydrofera Blue under 3 layer compression and making excellent progress. 11/02/2021: The wound is nearly completely epithelialized. Just a couple of open areas with some eschar overlying the surface. 11/12/2021: Just 1 small open area remains. It is clean without any slough or eschar accumulation. No hypertrophic granulation tissue present. 11/19/2021: Her wound is healed. Patient History Information obtained from Patient. [name], [name] ([id number]) [id number] Page 5 of 9 Family History Diabetes - Father, Heart Disease - Father, Hypertension - Father, No family history of Cancer, Hereditary Spherocytosis, Kidney Disease, Lung Disease, Seizures, Stroke, Thyroid Problems, Tuberculosis. Social History Former smoker - quit 30 years ago, Marital Status - Married, Alcohol Use - Never, Drug Use - No History, Caffeine Use - Rarely. Medical History Eyes Patient has history of Cataracts - Had cataract surgery Denies history of Glaucoma, Optic Neuritis Ear/Nose/Mouth/Throat Patient has history of Chronic sinus problems/congestion - Post Nasal drip Denies history of Middle ear problems Hematologic/Lymphatic Denies history of Anemia, Hemophilia, Human Immunodeficiency Virus, Lymphedema, Sickle Cell Disease Respiratory Denies history of Aspiration, Asthma, Chronic Obstructive Pulmonary Disease (COPD), Pneumothorax, Sleep Apnea, Tuberculosis Cardiovascular Patient has history of Hypertension Gastrointestinal Patient has history of Crohnoos - Has Chrons Disease Denies history of Cirrhosis , Colitis, Hepatitis A, Hepatitis B, Hepatitis C Endocrine Denies history of Type [name] Diabetes, Type II Diabetes Genitourinary Denies history of End Stage Renal Disease Immunological Denies history of Lupus Erythematosus, Raynaudoos, Scleroderma Musculoskeletal Patient has history of Osteoarthritis - Knees Denies  history of Gout, Rheumatoid Arthritis, Osteomyelitis Neurologic Denies history of Dementia, Neuropathy, Quadriplegia, Paraplegia, Seizure Disorder Oncologic Denies history of Received Chemotherapy, Received Radiation Psychiatric Denies history of Anorexia/bulimia, Confinement Anxiety Hospitalization/Surgery History - bil TKA. - Moh's procedure right leg 08/17/21. - bil cataract extractions. - nasal fx surgery. Medical A Surgical History Notes nd Gastrointestinal Has a large hiatal hernia. Has Chrons Disease., GERD, pancreatic insufficiency Endocrine Pancreatic Insufficiency Genitourinary stage 3 Chronic Kidney disease Integumentary (Skin) Had Squamous Cell (skin cancer). Had a skin graft on Right knee (area) Musculoskeletal Had Bil knee replacements, hx cervical spine fx, sternal fx, thoracis spine fx Neurologic left foot drop Oncologic Had Squamous Cell (skin cell)  Objective Constitutional Hypertensive, asymptomatic. No acute distress.. Vitals Time Taken: 10:30 AM, Height: 60 in, Weight: 101 lbs, BMI: 19.7, Temperature: 97.9 F, Pulse: 90 bpm, Respiratory Rate: 20 breaths/min, Blood Pressure: 177/50 mmHg. Respiratory Normal work of breathing on room air.. General Notes: 11/19/2021: Her wound is healed. Integumentary (Hair, Skin) Wound #2 status is Healed - Epithelialized. Original cause of wound was Surgical Injury. The date acquired was: 08/17/2021. The wound has been in treatment 8 weeks. The wound is located on the Right,Lateral Lower Leg. The wound measures 0cm length x 0cm width x 0cm depth; 0cm^2 area and 0cm^3 volume. There is no tunneling or undermining noted. There is a none present amount of drainage noted. There is no granulation within the wound bed. There is no necrotic tissue within the wound bed. The periwound skin appearance had no abnormalities noted for texture. The periwound skin appearance had no abnormalities noted for moisture. The periwound skin appearance  had no abnormalities noted for color. Periwound temperature was noted as No Abnormality. [name], [name] ([id number]) [id number] Page 6 of 9 Assessment Active Problems ICD-10 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d Disruption of external operation (surgical) wound, not elsewhere classified, sequela Essential (primary) hypertension Squamous cell carcinoma of skin of right lower limb, including hip Crohn's disease, unspecified, without complications Unspecified severe protein-calorie malnutrition Plan Discharge From [facility] Services: Discharge from [place] Bathing/ Shower/ Hygiene: May shower and wash wound with soap and water. Edema Control - Lymphedema / SCD / Other: Elevate legs to the level of the heart or above for 30 minutes daily and/or when sitting, a frequency of: Avoid standing for long periods of time. Exercise regularly Moisturize legs daily. Non Wound Condition: Protect area with: - healed area with bandaid for 1 more week to protect 11/09/2021: Her wound is healed. [name] recommended that she protect it for about the next week with a light bandage to avoid traumatizing the recently healed area. She will be discharged from the wound care center and may follow-up as needed. Electronic Signature(s) Signed: 11/19/2021 10:58:20 AM By: [name] MD FACS Entered By: [name] on 11/19/2021 10:58:20 -------------------------------------------------------------------------------- HxROS Details Patient Name: Date of Service: [name]. 11/19/2021 10:30 A M Medical Record Number: [id number] Patient Account Number: [id number] Date of Birth/Sex: Treating RN: 03-Apr-1945 (76 y.o. F) Primary Care Provider: Domingo Mend Other Clinician: Referring Provider: Treating Provider/Extender: Caprice Kluver in Treatment: 8 Information Obtained  From Patient Eyes Medical History: Positive for: Cataracts - Had cataract surgery Negative for: Glaucoma; Optic Neuritis Ear/Nose/Mouth/Throat Medical History: Positive for: Chronic sinus problems/congestion - Post Nasal drip Negative for: Middle ear problems Hematologic/Lymphatic Medical History: Negative for: Anemia; Hemophilia; Human Immunodeficiency Virus; Lymphedema; Sickle Cell Disease [name], [name] ([id number]) [id number] Page 7 of 9 Respiratory Medical History: Negative for: Aspiration; Asthma; Chronic Obstructive Pulmonary Disease (COPD); Pneumothorax; Sleep Apnea; Tuberculosis Cardiovascular Medical History: Positive for: Hypertension Gastrointestinal Medical History: Positive for: Crohns - Has Chrons Disease Negative for: Cirrhosis ; Colitis; Hepatitis A; Hepatitis B; Hepatitis C Past Medical History Notes: Has a large hiatal hernia. Has Chrons Disease., GERD, pancreatic insufficiency Endocrine Medical History: Negative for: Type [name] Diabetes; Type II Diabetes Past Medical History Notes: Pancreatic Insufficiency Genitourinary Medical History: Negative for: End Stage Renal Disease Past Medical History Notes: stage 3 Chronic Kidney disease Immunological Medical History: Negative for: Lupus Erythematosus; Raynauds; Scleroderma Integumentary (Skin) Medical History: Past Medical History Notes: Had Squamous  Cell (skin cancer). Had a skin graft on Right knee (area) Musculoskeletal Medical History: Positive for: Osteoarthritis - Knees Negative for: Gout; Rheumatoid Arthritis; Osteomyelitis Past Medical History Notes: Had Bil knee replacements, hx cervical spine fx, sternal fx, thoracis spine fx Neurologic Medical History: Negative for: Dementia; Neuropathy; Quadriplegia; Paraplegia; Seizure Disorder Past Medical History Notes: left foot drop Oncologic Medical History: Negative for: Received Chemotherapy; Received Radiation Past  Medical History Notes: Had Squamous Cell (skin cell) Psychiatric Medical History: Negative for: Anorexia/bulimia; Confinement Anxiety HBO Extended History Items Ear/Nose/Mouth/Throat: Eyes: Chronic sinus Cataracts problems/congestion Immunizations Pneumococcal Vaccine: Received Pneumococcal VaccinationLIBRADA, [name] ([id number]) [id number] Page 8 of 9 Received Pneumococcal Vaccination On or After 60th Birthday: Yes Tetanus Vaccine: Last tetanus shot: 11/16/2011 Implantable Devices None Hospitalization / Surgery History Type of Hospitalization/Surgery bil TKA Moh's procedure right leg 08/17/21 bil cataract extractions nasal fx surgery Family and Social History Cancer: No; Diabetes: Yes - Father; Heart Disease: Yes - Father; Hereditary Spherocytosis: No; Hypertension: Yes - Father; Kidney Disease: No; Lung Disease: No; Seizures: No; Stroke: No; Thyroid Problems: No; Tuberculosis: No; Former smoker - quit 30 years ago; Marital Status - Married; Alcohol Use: Never; Drug Use: No History; Caffeine Use: Rarely; Financial Concerns: No; Food, Clothing or Shelter Needs: No; Support System Lacking: No; Transportation Concerns: No Electronic Signature(s) Signed: 11/19/2021 10:59:09 AM By: [name] MD FACS Entered By: [name] on 11/19/2021 10:57:13 -------------------------------------------------------------------------------- SuperBill Details Patient Name: Date of Service: [name]. 11/19/2021 Medical Record Number: [id number] Patient Account Number: [id number] Date of Birth/Sex: Treating RN: 1945-11-20 (76 y.o. F) Primary Care Provider: Domingo Mend Other Clinician: Referring Provider: Treating Provider/Extender: Gevena Cotton Weeks in Treatment: 8 Diagnosis Coding ICD-10 Codes Code Description [phone] 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d T81.31XS  Disruption of external operation (surgical) wound, not elsewhere classified, sequela I10 Essential (primary) hypertension C44.722 Squamous cell carcinoma of skin of right lower limb, including hip K50.90 Crohn's disease, unspecified, without complications L38 Unspecified severe protein-calorie malnutrition Facility Procedures : CPT4 Code: [id number] Description: 99213 - WOUND CARE VISIT-LEV 3 EST PT Modifier: Quantity: 1 Physician Procedures : CPT4 Code Description Modifier [id number] - WC PHYS LEVEL 3 - EST PT ICD-10 Diagnosis Description T61.443 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d T81.31XS Disruption of external operation (surgical) wound,  not elsewhere classified, sequela C44.722 Squamous cell carcinoma of skin of right lower limb, including hip E43 Unspecified severe protein-calorie malnutrition Quantity: 1 Electronic Signature(s) Signed: 11/19/2021 11:27:35 AM By: [name] MD FACS Signed: 11/19/2021 12:51:09 PM By: Baruch Gouty RN, BSN Previous Signature: 11/19/2021 10:58:50 AM Version By: [name] MD FACS [name] ([id number]) [id number] Page 9 of 9 Previous Signature: 11/19/2021 10:58:50 AM Version By: [name] MD FACS Entered By: Baruch Gouty on 11/19/2021 11:02:18

## 2021-11-20 ENCOUNTER — Telehealth: Payer: Self-pay | Admitting: Neurology

## 2021-11-20 ENCOUNTER — Encounter: Payer: Self-pay | Admitting: Internal Medicine

## 2021-11-20 ENCOUNTER — Ambulatory Visit (INDEPENDENT_AMBULATORY_CARE_PROVIDER_SITE_OTHER): Payer: Medicare Other | Admitting: Neurology

## 2021-11-20 ENCOUNTER — Ambulatory Visit (INDEPENDENT_AMBULATORY_CARE_PROVIDER_SITE_OTHER): Payer: Medicare Other | Admitting: Internal Medicine

## 2021-11-20 VITALS — BP 130/70 | HR 95 | Temp 98.2°F | Wt 101.9 lb

## 2021-11-20 DIAGNOSIS — B3731 Acute candidiasis of vulva and vagina: Secondary | ICD-10-CM

## 2021-11-20 DIAGNOSIS — Z4802 Encounter for removal of sutures: Secondary | ICD-10-CM

## 2021-11-20 DIAGNOSIS — G5732 Lesion of lateral popliteal nerve, left lower limb: Secondary | ICD-10-CM

## 2021-11-20 DIAGNOSIS — K638219 Small intestinal bacterial overgrowth, unspecified: Secondary | ICD-10-CM

## 2021-11-20 MED ORDER — FLUCONAZOLE 100 MG PO TABS: 100.0000 mg | ORAL_TABLET | Freq: Every day | ORAL | 0 refills | Status: AC

## 2021-11-20 MED ORDER — SACCHAROMYCES BOULARDII 250 MG PO CAPS: 250.0000 mg | ORAL_CAPSULE | Freq: Two times a day (BID) | ORAL | 0 refills | Status: DC

## 2021-11-20 NOTE — Progress Notes (Signed)
Established Patient Office Visit     CC/Reason for Visit: Staple removal  HPI: [name] is a 76 y.o. female who is coming in today for the above mentioned reasons.  Her dropfoot led to a fall.  On 11/5 she had 11 staples placed in the ED.  She is here today for staple removal.  She has recently been on multiple rounds of antibiotics for a lower extremity infection.  She has developed what she believes to be a vaginal yeast and is requesting treatment for this.  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  She has had longstanding GI complaints that are related to Crohn's/IBD among other things.   Past Medical/Surgical History: Past Medical History:  Diagnosis Date   Allergies    Bruises easily    Complication of anesthesia    Crohn's disease ([place])    GI Dr Beulah Gandy in [place], [place]    Deviated septum    GERD (gastroesophageal reflux disease)    Headache    occ    Hiatal hernia    HTN (hypertension)    pcp Dr Margaretann Loveless  in [place], [place]    Kidney disease, chronic, stage III (GFR 30-59 ml/min) ([place])    Neuropathy    fingers,periodically    Osteoarthritis    Pancreatic insufficiency    PONV (postoperative nausea and vomiting)    Rheumatoid arthritis ([place])    Scoliosis    Squamous cell carcinoma of arm    left    Whooping cough 2015   Whooping cough with pneumonia    2017    Past Surgical History:  Procedure Laterality Date   CATARACT EXTRACTION, BILATERAL  2018   with IOC implant    COLONOSCOPY     DEBRIDEMENT AND CLOSURE WOUND Right 10/01/2018   Procedure: Excision of right knee wound with placement of ACell and Pravena;  Surgeon: Wallace Going, DO;  Location: [facility];  Service: Plastics;  Laterality: Right;  30 min   JOINT REPLACEMENT  2020   right knee   knee fracture      NASAL FRACTURE SURGERY     needed b/c she was getting frequent sinus  infections    TOTAL KNEE ARTHROPLASTY Right 07/28/2018   Procedure: TOTAL KNEE ARTHROPLASTY;  Surgeon: Paralee Cancel, MD;  Location: [facility];  Service: Orthopedics;  Laterality: Right;   TOTAL KNEE ARTHROPLASTY Left 03/02/2019   Procedure: TOTAL KNEE ARTHROPLASTY;  Surgeon: Paralee Cancel, MD;  Location: [facility];  Service: Orthopedics;  Laterality: Left;  70 mins   UPPER GASTROINTESTINAL ENDOSCOPY      Social History:  reports that she quit smoking about 33 years ago. Her smoking use included cigarettes. She has a 30.00 pack-year smoking history. She has never used smokeless tobacco. She reports that she does not currently use alcohol. She reports that she does not use drugs.  Allergies: Allergies  Allergen Reactions   Adhesive [Tape]     Redness and skin peeling   Aspirin     Intestinal Bleeding   Ciprofloxacin Nausea And Vomiting   Codeine Nausea And Vomiting   Demerol [Meperidine Hcl] Nausea And Vomiting   Fentanyl Nausea And Vomiting    Makes me very sick   Lactose Intolerance (Gi) Other (See Comments)    Pt has Crohn's    Latex     Redness and skin peeling  Other     Nuts-itching in throat  Seeds-stomach issues with chrons     Prednisone Other (See Comments)    Upset stomach   Septra [Sulfamethoxazole-Trimethoprim] Nausea Only    Family History:  Family History  Problem Relation Age of Onset   Diabetes Father    Parkinson's disease Father    Colon cancer Other    Asthma Neg Hx    Esophageal cancer Neg Hx    Pancreatic cancer Neg Hx    Liver disease Neg Hx    Stomach cancer Neg Hx      Current Outpatient Medications:    amoxicillin-clavulanate (AUGMENTIN) 250-125 MG tablet, Take 1 tablet by mouth 2 (two) times daily., Disp: 20 tablet, Rfl: 3   Budeson-Glycopyrrol-Formoterol (BREZTRI AEROSPHERE) 160-9-4.8 MCG/ACT AERO, Inhale 2 puffs into the lungs in the morning and at bedtime., Disp: 10.7 g, Rfl: 6   Calcium Citrate-Vitamin D (CALCIUM + D PO), Take 1 tablet by  mouth 2 (two) times daily., Disp: , Rfl:    cetirizine (ZYRTEC) 10 MG tablet, Take 1 tablet (10 mg total) by mouth daily., Disp: 90 tablet, Rfl: 1   colestipol (COLESTID) 1 g tablet, Take 2 tablets (2 g total) by mouth 2 (two) times daily., Disp: 180 tablet, Rfl: 3   dicyclomine (BENTYL) 20 MG tablet, Take 1 tablet (20 mg total) by mouth 4 (four) times daily as needed for spasms., Disp: 360 tablet, Rfl: 3   diphenoxylate-atropine (LOMOTIL) 2.5-0.025 MG tablet, Take 2 tablets by mouth in the morning, at noon, and at bedtime., Disp: 180 tablet, Rfl: 6   DULoxetine (CYMBALTA) 60 MG capsule, Take 1 capsule (60 mg total) by mouth 2 (two) times daily., Disp: 180 capsule, Rfl: 1   fluconazole (DIFLUCAN) 100 MG tablet, Take 1 tablet (100 mg total) by mouth daily for 3 days., Disp: 3 tablet, Rfl: 0   fluticasone (FLONASE) 50 MCG/ACT nasal spray, Place 2 sprays into both nostrils daily., Disp: 48 mL, Rfl: 1   furosemide (LASIX) 20 MG tablet, Take 1 tablet (20 mg total) by mouth daily as needed for edema., Disp: 30 tablet, Rfl: 2   ipratropium (ATROVENT) 0.03 % nasal spray, Place 2 sprays into both nostrils every 12 (twelve) hours., Disp: 30 mL, Rfl: 12   losartan (COZAAR) 100 MG tablet, Take 1 tablet (100 mg total) by mouth daily., Disp: 90 tablet, Rfl: 1   mesalamine (LIALDA) 1.2 g EC tablet, Take 4 tablets (4.8 g total) by mouth daily with breakfast., Disp: 360 tablet, Rfl: 3   Multiple Vitamin (MULTIVITAMIN WITH MINERALS) TABS tablet, Take 1 tablet by mouth daily., Disp: , Rfl:    omeprazole (PRILOSEC) 40 MG capsule, Take 1 capsule (40 mg total) by mouth in the morning and at bedtime., Disp: 180 capsule, Rfl: 1   saccharomyces boulardii (FLORASTOR) 250 MG capsule, Take 1 capsule (250 mg total) by mouth 2 (two) times daily., Disp: 180 capsule, Rfl: 0   valACYclovir (VALTREX) 1000 MG tablet, TAKE 1 TABLET (1,000 MG TOTAL) BY MOUTH TWICE A DAY AS NEEDED, Disp: 90 tablet, Rfl: 1  Review of Systems:   Constitutional: Denies fever, chills, diaphoresis, appetite change and fatigue.  HEENT: Denies photophobia, eye pain, redness, hearing loss, ear pain, congestion, sore throat, rhinorrhea, sneezing, mouth sores, trouble swallowing, neck pain, neck stiffness and tinnitus.   Respiratory: Denies SOB, DOE, cough, chest tightness,  and wheezing.   Cardiovascular: Denies chest pain, palpitations and leg swelling.  Gastrointestinal: Denies nausea, vomiting, abdominal pain, constipation and  abdominal distention.  Genitourinary: Denies dysuria, urgency, frequency, hematuria, flank pain and difficulty urinating.  Endocrine: Denies: hot or cold intolerance, sweats, changes in hair or nails, polyuria, polydipsia. Musculoskeletal: Denies myalgias, back pain, joint swelling, arthralgias and gait problem.  Skin: Denies pallor, rash and wound.  Neurological: Denies dizziness, seizures, syncope, weakness, light-headedness, numbness and headaches.  Hematological: Denies adenopathy. Easy bruising, personal or family bleeding history  Psychiatric/Behavioral: Denies suicidal ideation, mood changes, confusion, nervousness, sleep disturbance and agitation    Physical Exam: Vitals:   11/20/21 1428  BP: 130/70  Pulse: 95  Temp: 98.2 F (36.8 C)  TempSrc: Oral  SpO2: 95%  Weight: 101 lb 14.4 oz (46.2 kg)    Body mass index is 19.9 kg/m.   Constitutional: NAD, calm, comfortable Eyes: PERRL, lids and conjunctivae normal ENMT: Mucous membranes are moist.  Psychiatric: Normal judgment and insight. Alert and oriented x 3. Normal mood.    Impression and Plan:  Encounter for staple removal  Vaginal yeast infection - Plan: fluconazole (DIFLUCAN) 100 MG tablet  Small intestinal bacterial overgrowth (SIBO) - Plan: saccharomyces boulardii (FLORASTOR) 250 MG capsule  -11 staples have been removed without complication. -Fluconazole prescribed for her vaginal yeast. -Since she has already been diagnosed with  SIBO by GI, she is again having diarrhea after discontinuing amoxicillin, will try Florastor.  Time spent:32 minutes reviewing chart, interviewing and examining patient and formulating plan of care.      Lelon Frohlich, MD [facility]

## 2021-11-20 NOTE — Procedures (Signed)
  [facility]  [address]  [place], [place] [address] Tel: [phone] Fax: [phone] Test Date:  11/20/2021  Patient: [name] DOB: 08-22-1945 Physician: Kai Levins, MD  Sex: Female Height: 5' 0"  Ref Phys: Alonza Bogus, DO  ID#: [id number]   Technician:    History: This is a 77 year old female with leg weakness and falls.  Findings: High frequency (4.0-16.0 MHz) B-mode, nonvascular ultrasound of the left lower extremity shows: Cross sectional areas (CSA) of left sciatic, tibial, and fibular (peroneal) nerves are within normal limits. Focal increased CSA of the left fibular (peroneal) nerve at the fibular head compared to popliteal fossa (9.4 mm2 vs 3.4 mm2) with decreased caliber at the fibular head on long axis view (see image #3.1 below).  Impression: This is an abnormal ultrasound. 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 No ultrasonographic evidence of entrapment or other focal pathology along the studied course of the sciatic nerve (piriformis to popliteal fossa) or tibial nerve (popliteal fossa to proximal calf) on the left side. No other obvious lesion involving the adjacent bone or tendon is identified. No definite vascular abnormalities.  Nerve Measurements   Site Area Mobility Vascularity Comment   mm Norm     Left Fibular  Fib head 9.4  < 17.8      Pop fossa 3.6  < 20.9      Left Sciatic  Distal Thigh 11.0  < 80.0      piriformis 15.3      Left Tibial  Ankle 3.7  < 22.3      Proximal calf 8.5  < 39.9      Pop fossa 15.3  < 55.9       Ultrasound Images:

## 2021-11-20 NOTE — Telephone Encounter (Signed)
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  Dr. Berdine Addison told her this at visit and he noted that she was crossing her legs, which can cause this.  He told her not to do that.  He also told her that the tx was PT and she admitted that she had put it off for 6 weeks because she had other things going on.  Tell her to start that and that we will send a copy of the nerve ultrasound and emg to Dr. Ronnald Ramp.  She should keep f/u with him.  Please send the emg and u/s to Dr. Ronnald Ramp for the patient as he is treating her for this.

## 2021-11-21 NOTE — Telephone Encounter (Signed)
Called patient and informed her of results and recommendations from Dr. Carles Collet. Patient stated that it doesn't sound like we are offering her any relief? I informed her that I was just calling to inform her of results and recommendations from  Dr. Carles Collet and was not sure what was discussed in her appointments. Patient is aware that we are going to fax results over to Dr. Ronnald Ramp.

## 2021-11-21 NOTE — Telephone Encounter (Signed)
Results faxed to Dr. Ronnald Ramp.

## 2021-11-26 ENCOUNTER — Encounter (HOSPITAL_BASED_OUTPATIENT_CLINIC_OR_DEPARTMENT_OTHER): Payer: Medicare Other | Admitting: General Surgery

## 2021-12-07 ENCOUNTER — Other Ambulatory Visit (HOSPITAL_BASED_OUTPATIENT_CLINIC_OR_DEPARTMENT_OTHER): Payer: Self-pay | Admitting: General Surgery

## 2021-12-07 ENCOUNTER — Encounter (HOSPITAL_BASED_OUTPATIENT_CLINIC_OR_DEPARTMENT_OTHER): Payer: Medicare Other | Attending: General Surgery | Admitting: General Surgery

## 2021-12-07 DIAGNOSIS — L97812 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d: Secondary | ICD-10-CM | POA: Diagnosis present

## 2021-12-07 DIAGNOSIS — C44722 Squamous cell carcinoma of skin of right lower limb, including hip: Secondary | ICD-10-CM | POA: Insufficient documentation

## 2021-12-07 DIAGNOSIS — T8131XA Disruption of external operation (surgical) wound, not elsewhere classified, initial encounter: Secondary | ICD-10-CM | POA: Insufficient documentation

## 2021-12-07 DIAGNOSIS — I10 Essential (primary) hypertension: Secondary | ICD-10-CM | POA: Insufficient documentation

## 2021-12-07 DIAGNOSIS — K5090 Crohn's disease, unspecified, without complications: Secondary | ICD-10-CM | POA: Diagnosis not present

## 2021-12-07 DIAGNOSIS — Y839 Surgical procedure, unspecified as the cause of abnormal reaction of the patient, or of later complication, without mention of misadventure at the time of the procedure: Secondary | ICD-10-CM | POA: Diagnosis not present

## 2021-12-07 DIAGNOSIS — E43 Unspecified severe protein-calorie malnutrition: Secondary | ICD-10-CM | POA: Diagnosis not present

## 2021-12-10 NOTE — Progress Notes (Addendum)
CORRINNE, BENEGAS [name] ([id number]) [id number] Page 1 of 14 Visit Report for 12/07/2021 Biopsy Details Patient Name: Date of Service: [name], [name] 12/07/2021 1:15 PM Medical Record Number: [id number] Patient Account Number: [id number] Date of Birth/Sex: Treating RN: 1945-05-25 (76 y.o. F) Primary Care Provider: Domingo Mend Other Clinician: Referring Provider: Treating Provider/Extender: Gevena Cotton Weeks in Treatment: 11 Biopsy Performed for: Wound #4 Left Knee Location(s): Wound Bed Performed By: Physician [name], MD Tissue Punch: Yes Size (mm): 4 Number of Specimens T aken: 1 Specimen Sent T Pathology: o Yes Level of Consciousness (Pre-procedure): Awake and Alert Pre-procedure Verification/Time-Out Taken: Yes - 14:05 Pain Control: Lidocaine Injectable Lidocaine Percent: 1% Instrument: Forceps, Other: 52m punch biopsy, Scissors Bleeding: Minimum Level of Consciousness (Post-procedure): Awake and Alert Post Procedure Diagnosis Same as Pre-procedure Electronic Signature(s) Signed: 12/07/2021 2:59:47 PM By: CFredirick MaudlinMD FACS Entered By: CFredirick Maudlinon 12/07/2021 14:59:47 -------------------------------------------------------------------------------- Chief Complaint Document Details Patient Name: Date of Service: [name]. 12/07/2021 1:15 PM Medical Record Number: [id number] Account Number: [id number] of Birth/Sex: Treating RN: [birth year](76y.o. F) Primary Care Provider: HDomingo MendOther Clinician: Referring Provider: Treating Provider/Extender: CCaprice Kluverin Treatment: 11 Information Obtained from: Patient Chief Complaint 11/15/2020; patient is here for review of a wound area on the right posterior calf 09/20/2021: Patient is here for a nonhealing wound after undergoing Mohs surgery on her right lower leg. 12/07/2021: Reopening of skin at  site of previous wound, new wound on right toe, concerning lesion on left upper leg just above knee Electronic Signature(s) Signed: 12/07/2021 3:04:32 PM By: CFredirick MaudlinMD FACS Entered By: CFredirick Maudlinon 12/07/2021 15:04:32 -------------------------------------------------------------------------------- Debridement Details Patient Name: Date of Service: [name]. 12/07/2021 1:15 PM Medical Record Number: [id number] Account Number: [id number] of Birth/Sex: Treating RN: 6June 07, 1947(76y.o. FMarta LamasPrimary Care Provider: HDomingo MendOther Clinician: Referring Provider: Treating Provider/Extender: CGevena CottonWeeks in Treatment: 11 Debridement Performed for Assessment: Wound #3 T Third oMELANNI, BENWAYI ([id number] Page 2 of 14 Performed By: Physician CFredirick Maudlin MD Debridement Type: Debridement Level of Consciousness (Pre-procedure): Awake and Alert Pre-procedure Verification/Time Out Yes - 13:39 Taken: Start Time: 13:40 Pain Control: Lidocaine 5% topical ointment T Area Debrided (L x W): otal 0.6 (cm) x 0.4 (cm) = 0.24 (cm) Tissue and other material debrided: Non-Viable, Eschar, Slough, Slough Level: Non-Viable Tissue Debridement Description: Selective/Open Wound Instrument: Curette Bleeding: Minimum Hemostasis Achieved: Pressure Response to Treatment: Procedure was tolerated well Level of Consciousness (Post- Awake and Alert procedure): Post Debridement Measurements of Total Wound Length: (cm) 0.6 Stage: Category/Stage II Width: (cm) 0.4 Depth: (cm) 0.1 Volume: (cm) 0.019 Character of Wound/Ulcer Post Debridement: Requires Further Debridement Post Procedure Diagnosis Same as Pre-procedure Notes Scribed for Dr. CCeline Ahrby JBlanche East RN Electronic Signature(s) Signed: 12/07/2021 4:21:30 PM By: CFredirick MaudlinMD FACS Signed: 12/10/2021 5:02:57 PM By:  ZBlanche EastRN Entered By: ZBlanche Easton 12/07/2021 13:41:00 -------------------------------------------------------------------------------- Debridement Details Patient Name: Date of Service: [name]. 12/07/2021 1:15 PM Medical Record Number: [id number] Account Number: [id number] of Birth/Sex: Treating RN: [birth year](76y.o. FMarta LamasPrimary Care Provider: HDomingo MendOther Clinician: Referring Provider: Treating Provider/Extender: CGevena CottonWeeks in Treatment: 11 Debridement Performed for Assessment: Wound #2 Right,Lateral Lower Leg Performed By: Physician CFredirick Maudlin MD Debridement Type: Debridement  Severity of Tissue Pre Debridement: Fat layer exposed Level of Consciousness (Pre-procedure): Awake and Alert Pre-procedure Verification/Time Out Yes - 13:39 Taken: Start Time: 13:40 Pain Control: Lidocaine 5% topical ointment T Area Debrided (L x W): otal 9 (cm) x 5 (cm) = 45 (cm) Tissue and other material debrided: Non-Viable, Slough, Subcutaneous, Slough Level: Skin/Subcutaneous Tissue Debridement Description: Excisional Instrument: Curette Specimen: Tissue Culture Number of Specimens T aken: 1 Bleeding: Minimum Hemostasis Achieved: Pressure Response to Treatment: Procedure was tolerated well Level of Consciousness (Post- Awake and Alert procedure): Post Debridement Measurements of Total Wound Length: (cm) 9 Width: (cm) 5 Depth: (cm) 0.1 Volume: (cm) 3.534 Character of Wound/Ulcer Post Debridement: Requires Further Debridement JILLEEN, ESSNER [name] ([id number]) [id number] Page 3 of 14 Severity of Tissue Post Debridement: Fat layer exposed Post Procedure Diagnosis Same as Pre-procedure Notes scribed for Dr. [name] by Blanche East, RN Electronic Signature(s) Signed: 12/07/2021 4:21:30 PM By: [name] MD FACS Signed: 12/10/2021 5:02:57 PM By: Blanche East RN Entered  By: Blanche East on 12/07/2021 13:51:10 -------------------------------------------------------------------------------- HPI Details Patient Name: Date of Service: [name]. 12/07/2021 1:15 PM Medical Record Number: [id number] Patient Account Number: [id number] Date of Birth/Sex: Treating RN: 07/16/1945 (76 y.o. F) Primary Care Provider: Domingo Mend Other Clinician: Referring Provider: Treating Provider/Extender: Gevena Cotton Weeks in Treatment: 11 History of Present Illness HPI Description: ADMISSION 11/15/2020 This is a 76 year old woman with a history of squamous cell cancer skin cancer followed by Dr. Anabel Bene dermatology. She describes having a wound on the right posterior calf dating back since September. She saw her primary doctor in early October who suggested petroleum infused gauze. The patient has been doing this. No real improvement in fact she says this is getting larger. She does not complain much of pain. She is completely uncertain about how this happened there was no trauma she simply became aware of it at some point describing it is about the size of a quarter but it has been getting progressively larger since then. She is here for review of this. Past medical history Crohn's disease not currently on prednisone, gastroesophageal reflux disease, history of squamous cell skin cancer, history of rheumatoid arthritis, hypertension 11/16; this lady had a large superficial atypical wound on the right posterior calf. She has significant solar skin damage probably some degree of venous insufficiency but she described a rapidly expanding wound area. Because of this and a history of skin cancer [name] went ahead and did a shave biopsy but we do not have the results of this at the time we saw her in clinic today. She has been using silver alginate and kerlix 11/30; shave biopsy [name] did last time did not show any malignancy or fungal elements. Suggestion  of a stasis ulcer. With our treatment which is simply been silver alginate with kerlix that she is changing daily the wound is down dramatically almost surprising how rapidly it is reduced in surface area 12/14; patient comes in today with her wound healed this was on the right posterior calf. She has significant solar skin damage probably some degree of venous insufficiency however she described a rapidly expanding wound area on admission. Because of this and a history of skin cancer [name] did do a shave biopsy but nothing was really found of worry. She tells me that she had biopsies and her gastroenterologist is now saying she has "colitis" rather than Crohn's disease and she is now on steroids orally READMISSION 09/20/2021 She returns to clinic  today having undergone a Mohs procedure on her right anterior tibial surface on August 17, 2021. Unfortunately, the wound has failed to heal. There is thick slough on the wound surface. The periwound skin is in reasonably good condition. There is granulation tissue forming underneath the slough. 09/28/2021: The wound has deteriorated rather significantly over the past week. She had fairly copious drainage that she noticed as early as Monday, but she did not contact our office. Today there is a lot of periwound skin breakdown and a very pungent odor coming from the wound. There is thick slough on the wound surface but there is still good granulation tissue underlying this. Despite this, however, the wound actually measures smaller. 10/05/2021: The culture that [name] took grew out MRSA. [name] prescribed doxycycline and she is still taking this. There has been a tremendous improvement in the wound. It is smaller and has contracted considerably. There is hypertrophic granulation tissue present. No malodorous drainage. 10/16/2021: The wound has contracted further and is epithelializing. The surface is very clean and the hypertrophic granulation tissue has not recurred.  She completed her course of doxycycline. 10/19; surgical wound on the right medial lower extremity secondary to Mohs surgery done in August. She is using Hydrofera Blue under 3 layer compression and making excellent progress. 11/02/2021: The wound is nearly completely epithelialized. Just a couple of open areas with some eschar overlying the surface. 11/12/2021: Just 1 small open area remains. It is clean without any slough or eschar accumulation. No hypertrophic granulation tissue present. 11/19/2021: Her wound is healed. READMISSION 12/07/2021 The patient has been moving for the past couple of weeks and has spent a substantial amount of time on her feet. As result, she developed leg swelling and the tissue where her wound had been, which she was protecting with a Band-Aid, broke down considerably. She now has a large wound that is irregular with extensive slough, involving the fat layer. She also developed a blister on her toe that has slough and eschar present. She also pointed out lump just above her left knee that she is concerned about, as she does have a history of multiple skin cancers. Electronic Signature(s) TINLEE, NAVARRETTE [name] ([id number]) [id number] Page 4 of 14 Signed: 12/07/2021 3:18:15 PM By: [name] MD FACS Entered By: [name] on 12/07/2021 15:18:15 -------------------------------------------------------------------------------- Physical Exam Details Patient Name: Date of Service: [name], [name] [name]. 12/07/2021 1:15 PM Medical Record Number: [id number] Patient Account Number: [id number] Date of Birth/Sex: Treating RN: [birth year] (76 y.o. F) Primary Care Provider: Domingo Mend Other Clinician: Referring Provider: Treating Provider/Extender: Gevena Cotton Weeks in Treatment: 11 Constitutional She is hypertensive, but asymptomatic.. . . . No acute distress. Respiratory Normal work of breathing on room  air. Notes 12/07/2021: The tissue where her wound had been, which she was protecting with a Band-Aid, broke down considerably. She now has a large wound that is irregular with extensive slough, involving the fat layer. She also developed a blister on her toe that has slough and eschar present. She also pointed out lump just above her left knee that she is concerned about, as she does have a history of multiple skin cancers. Electronic Signature(s) Signed: 12/07/2021 3:19:10 PM By: [name] MD FACS Entered By: [name] on 12/07/2021 15:19:10 -------------------------------------------------------------------------------- Physician Orders Details Patient Name: Date of Service: [name]. 12/07/2021 1:15 PM Medical Record Number: [id number] Patient Account Number: [id number] Date of Birth/Sex: Treating RN: 1945-02-15 (76 y.o. F) [place], [place]  Primary Care Provider: Domingo Mend Other Clinician: Referring Provider: Treating Provider/Extender: Caprice Kluver in Treatment: 11 Verbal / Phone Orders: No Diagnosis Coding ICD-10 Coding Code Description [phone] 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d L97.512 Non-pressure chronic ulcer of other part of right foot with fat layer exposed L98.9 Disorder of the skin and subcutaneous tissue, unspecified I10 Essential (primary) hypertension K50.90 Crohn's disease, unspecified, without complications E45 Unspecified severe protein-calorie malnutrition Follow-up Appointments ppointment in 1 week. - Dr. [name] Rm 1 Return A Anesthetic Wound #4 Left Knee (In clinic) Topical Lidocaine 5% applied to wound bed - in clinic, prior to debridement Bathing/ Shower/ Hygiene May shower with protection but do not get wound dressing(s) wet. - cover dressing on Right leg to prevent dressing from getting wet. Keep dressing dry at all times. Edema Control - Lymphedema /  SCD / Other Elevate legs to the level of the heart or above for 30 minutes daily and/or when sitting, a frequency of: Avoid standing for long periods of time. Moisturize legs daily. Wound Treatment Wound #2 - Lower Leg Wound Laterality: Right, Lateral ALYSSANDRA, HULSEBUS [name] ([id number]) [id number] Page 5 of 14 Cleanser: Soap and Water Discharge Instructions: May shower and wash wound with dial antibacterial soap and water prior to dressing change. Cleanser: Wound Cleanser Discharge Instructions: Cleanse the wound with wound cleanser prior to applying a clean dressing using gauze sponges, not tissue or cotton balls. Peri-Wound Care: Sween Lotion (Moisturizing lotion) Discharge Instructions: Apply moisturizing lotion as directed Prim Dressing: Sorbalgon AG Dressing, 4x4 (in/in) ary Discharge Instructions: Apply to wound bed as instructed Secondary Dressing: Zetuvit Plus Silicone Border Dressing 5x5 (in/in) Discharge Instructions: Apply silicone border over primary dressing as directed. Secured With: Transpore Surgical Tape, 2x10 (in/yd) Discharge Instructions: Secure dressing with tape as directed. Compression Wrap: ThreePress (3 layer compression wrap) Discharge Instructions: Apply three layer compression as directed. Wound #3 - T Third oe Cleanser: Soap and Water Discharge Instructions: May shower and wash wound with dial antibacterial soap and water prior to dressing change. Cleanser: Wound Cleanser Discharge Instructions: Cleanse the wound with wound cleanser prior to applying a clean dressing using gauze sponges, not tissue or cotton balls. Peri-Wound Care: Sween Lotion (Moisturizing lotion) Discharge Instructions: Apply moisturizing lotion as directed Prim Dressing: Sorbalgon AG Dressing, 4x4 (in/in) ary Discharge Instructions: Apply to wound bed as instructed Secondary Dressing: Zetuvit Plus Silicone Border Dressing 5x5 (in/in) Discharge Instructions: Apply  silicone border over primary dressing as directed. Wound #4 - Knee Wound Laterality: Left Cleanser: Soap and Water Discharge Instructions: May shower and wash wound with dial antibacterial soap and water prior to dressing change. Cleanser: Wound Cleanser Discharge Instructions: Cleanse the wound with wound cleanser prior to applying a clean dressing using gauze sponges, not tissue or cotton balls. Peri-Wound Care: Sween Lotion (Moisturizing lotion) Discharge Instructions: Apply moisturizing lotion as directed Prim Dressing: Sorbalgon AG Dressing, 4x4 (in/in) ary Discharge Instructions: Apply to wound bed as instructed Secondary Dressing: Zetuvit Plus Silicone Border Dressing 5x5 (in/in) Discharge Instructions: Apply silicone border over primary dressing as directed. Laboratory erobe culture (MICRO) - Non-healing wound to Rt LAT LL - (ICD10 L97.812 - Non-pressure Bacteria identified in Unspecified specimen by A chronic ulcer of other part of right lower leg with fat layer exposed) LOINC Code: 782-9 Convenience Name: Aerobic culture-specimen not specified Tissue Pathology biopsy report (PATH) - Left knee biopsy - (ICD10 L98.9 - Disorder of the skin and subcutaneous tissue,  unspecified) LOINC Code: [phone] Convenience Name: [place] Bx report [facility] - positive punch biopsy- well differentiated squamous cell carcinoma - (ICD10 L98.9 - Disorder of the skin and subcutaneous tissue, unspecified) Patient Medications llergies: Demerol, latex, aspirin, ciprofloxacin, codeine, Septra, lactose, adhesive tape, fentanyl A Notifications Medication Indication Start End GEORGANNE, SIPLE [name] ([id number]) [id number] Page 6 of 14 12/07/2021 doxycycline hyclate DOSE oral 100 mg tablet - 1 tab p.o. twice daily x 10 days; separate from calcium and multivitamin by 6 hours Electronic Signature(s) Signed: 01/14/2022 2:25:03 PM By: [name] MD  FACS Signed: 03/04/2022 4:56:47 PM By: Blanche East RN Previous Signature: 12/07/2021 3:54:28 PM Version By: Dellie Catholic RN Previous Signature: 12/07/2021 4:21:30 PM Version By: [name] MD FACS Previous Signature: 12/07/2021 3:21:55 PM Version By: [name] MD FACS Entered By: Blanche East on 12/14/2021 12:27:13 Prescription 12/07/2021 -------------------------------------------------------------------------------- [name], [name]. [name] MD Patient Name: Provider: Feb 25, 1945 [id number] Date of Birth: NPI#: [id number] Sex: DEA #: [phone] Phone #: License #: [place] Patient Address: [address] [place], [place] [address] [place], [place] [address] [phone] Allergies Demerol; latex; aspirin; ciprofloxacin; codeine; Septra; lactose; adhesive tape; fentanyl Provider's Orders erobe culture - ICD10: L97.812 - Non-healing wound to Rt LAT LL Bacteria identified in Unspecified specimen by A LOINC Code: 564-3 Convenience Name: Aerobic culture-specimen not specified Hand Signature: Date(s): Prescription 12/07/2021 Dipiero, Memory [name]. [name] MD Patient Name: Provider: September 10, 1945 [id number] Date of Birth: NPI#: [id number] Sex: DEA #: [id number] [phone] Phone #: License #: [place] Patient Address: [facility] [address] [place], [place] [address] [place], [place] [address] [phone] Allergies Demerol; latex; aspirin; ciprofloxacin; codeine; Septra; lactose; adhesive tape; fentanyl Provider's Orders Tissue Pathology biopsy report - ICD10: L98.9 - Left knee biopsy LOINC Code: 62694-8 Convenience Name: [facility] Bx report Hand Signature: Date(s): Prescription 12/07/2021 [name], [name]. [name] MD Patient Name: Provider: [birth year] [id number] Date of Birth: NPI#: [id number] Sex: DEA  #: [phone] Phone #: License #: [place] Patient Address: [place], [place] ([id number]) [id number] [address] [place], [place] [address] [place],  [address] [phone] Allergies Demerol; latex; aspirin; ciprofloxacin; codeine; Septra; lactose; adhesive tape; fentanyl Provider's Orders [facility] - ICD10: L98.9 - positive punch biopsy- well differentiated squamous cell carcinoma Hand Signature: Date(s): Electronic Signature(s) Signed: 01/14/2022 2:25:03 PM By: [name] MD FACS Signed: 03/04/2022 4:56:47 PM By: Blanche East RN Previous Signature: 12/07/2021 3:54:28 PM Version By: Dellie Catholic RN Previous Signature: 12/07/2021 4:21:30 PM Version By: [name] MD FACS Previous Signature: 12/07/2021 3:25:24 PM Version By: [name] MD FACS Entered By: Blanche East on 12/14/2021 12:27:13 -------------------------------------------------------------------------------- Problem List Details Patient Name: Date of Service: [name], [name]. 12/07/2021 1:15 PM Medical Record Number: [id number] Patient Account Number: [id number] Date of Birth/Sex: Treating RN: 1945/11/15 (76 y.o. F) Primary Care Provider: Domingo Mend Other Clinician: Referring Provider: Treating Provider/Extender: Caprice Kluver in Treatment: 11 Active Problems ICD-10 Encounter Code Description Active Date MDM Diagnosis L97.812 Non-pressure chronic ulcer of other part of right lower leg with fat layer 09/20/2021 No Yes exposed L97.512 Non-pressure chronic ulcer of other part of right foot with fat layer exposed 12/07/2021 No Yes L98.9 Disorder of the skin and subcutaneous  tissue, unspecified 12/07/2021 No Yes I10 Essential (primary) hypertension 09/20/2021 No Yes K50.90 Crohn's disease, unspecified, without complications 1/61/0960 No  Yes E43 Unspecified severe protein-calorie malnutrition 09/20/2021 No Yes Inactive Problems ICD-10 Code Description Active Date Inactive Date T81.31XS Disruption of external operation (surgical) wound, not elsewhere classified, sequela 09/20/2021 09/20/2021 C44.722 Squamous cell carcinoma of skin of right lower limb, including hip 09/20/2021 09/20/2021 ADJOA, ALTHOUSE [name] ([id number]) [phone].pdf Page 8 of 14 Resolved Problems Electronic Signature(s) Signed: 12/07/2021 2:53:30 PM By: [name] MD FACS Entered By: [name] on 12/07/2021 14:53:30 -------------------------------------------------------------------------------- Progress Note Details Patient Name: Date of Service: [name]. 12/07/2021 1:15 PM Medical Record Number: [id number] Patient Account Number: [id number] Date of Birth/Sex: Treating RN: November 16, 1945 (76 y.o. F) Primary Care Provider: Domingo Mend Other Clinician: Referring Provider: Treating Provider/Extender: Caprice Kluver in Treatment: 11 Subjective Chief Complaint Information obtained from Patient 11/15/2020; patient is here for review of a wound area on the right posterior calf 09/20/2021: Patient is here for a nonhealing wound after undergoing Mohs surgery on her right lower leg. 12/07/2021: Reopening of skin at site of previous wound, new wound on right toe, concerning lesion on left upper leg just above knee History of Present Illness (HPI) ADMISSION 11/15/2020 This is a 76 year old woman with a history of squamous cell cancer skin cancer followed by Dr. Anabel Bene dermatology. She describes having a wound on the right posterior calf dating back since September. She saw her primary doctor in early October who suggested petroleum infused gauze. The patient has been doing this. No real improvement in fact she says this is getting larger. She does not complain much of pain. She is completely  uncertain about how this happened there was no trauma she simply became aware of it at some point describing it is about the size of a quarter but it has been getting progressively larger since then. She is here for review of this. Past medical history Crohn's disease not currently on prednisone, gastroesophageal reflux disease, history of squamous cell skin cancer, history of rheumatoid arthritis, hypertension 11/16; this lady had a large superficial atypical wound on the right posterior calf. She has significant solar skin damage probably some degree of venous insufficiency but she described a rapidly expanding wound area. Because of this and a history of skin cancer [name] went ahead and did a shave biopsy but we do not have the results of this at the time we saw her in clinic today. She has been using silver alginate and kerlix 11/30; shave biopsy [name] did last time did not show any malignancy or fungal elements. Suggestion of a stasis ulcer. With our treatment which is simply been silver alginate with kerlix that she is changing daily the wound is down dramatically almost surprising how rapidly it is reduced in surface area 12/14; patient comes in today with her wound healed this was on the right posterior calf. She has significant solar skin damage probably some degree of venous insufficiency however she described a rapidly expanding wound area on admission. Because of this and a history of skin cancer [name] did do a shave biopsy but nothing was really found of worry. 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 Unfortunately, the wound has  failed to heal. There is thick slough on the wound surface. The periwound skin is in reasonably good condition. There  is granulation tissue forming underneath the slough. 09/28/2021: The wound has deteriorated rather significantly over the past week. She had fairly copious drainage that she noticed as early as Monday, but she did not contact our office. Today there is a lot of periwound skin breakdown and a very pungent odor coming from the wound. There is thick slough on the wound surface but there is still good granulation tissue underlying this. Despite this, however, the wound actually measures smaller. 10/05/2021: The culture that [name] took grew out MRSA. [name] prescribed doxycycline and she is still taking this. There has been a tremendous improvement in the wound. It is smaller and has contracted considerably. There is hypertrophic granulation tissue present. No malodorous drainage. 10/16/2021: The wound has contracted further and is epithelializing. The surface is very clean and the hypertrophic granulation tissue has not recurred. She completed her course of doxycycline. 10/19; surgical wound on the right medial lower extremity secondary to Mohs surgery done in August. She is using Hydrofera Blue under 3 layer compression and making excellent progress. 11/02/2021: The wound is nearly completely epithelialized. Just a couple of open areas with some eschar overlying the surface. 11/12/2021: Just 1 small open area remains. It is clean without any slough or eschar accumulation. No hypertrophic granulation tissue present. 11/19/2021: Her wound is healed. READMISSION 12/07/2021 The patient has been moving for the past couple of weeks and has spent a substantial amount of time on her feet. As result, she developed leg swelling and the tissue where her wound had been, which she was protecting with a Band-Aid, broke down considerably. She now has a large wound that is irregular with extensive slough, involving the fat layer. She also developed a blister on her toe that has slough and eschar present. She also pointed out lump  just above her left knee that she is concerned about, as she does have a history of multiple skin cancers. Patient History [name], [name] ([id number]) [id number] Page 9 of 14 Information obtained from Patient. Family History Diabetes - Father, Heart Disease - Father, Hypertension - Father, No family history of Cancer, Hereditary Spherocytosis, Kidney Disease, Lung Disease, Seizures, Stroke, Thyroid Problems, Tuberculosis. Social History Former smoker - quit 30 years ago, Marital Status - Married, Alcohol Use - Never, Drug Use - No History, Caffeine Use - Rarely. Medical History Eyes Patient has history of Cataracts - Had cataract surgery Denies history of Glaucoma, Optic Neuritis Ear/Nose/Mouth/Throat Patient has history of Chronic sinus problems/congestion - Post Nasal drip Denies history of Middle ear problems Hematologic/Lymphatic Denies history of Anemia, Hemophilia, Human Immunodeficiency Virus, Lymphedema, Sickle Cell Disease Respiratory Denies history of Aspiration, Asthma, Chronic Obstructive Pulmonary Disease (COPD), Pneumothorax, Sleep Apnea, Tuberculosis Cardiovascular Patient has history of Hypertension Gastrointestinal Patient has history of Crohnoos - Has Chrons Disease Denies history of Cirrhosis , Colitis, Hepatitis A, Hepatitis B, Hepatitis C Endocrine Denies history of Type [name] Diabetes, Type II Diabetes Genitourinary Denies history of End Stage Renal Disease Immunological Denies history of Lupus Erythematosus, Raynaudoos, Scleroderma Musculoskeletal Patient has history of Osteoarthritis - Knees Denies history of Gout, Rheumatoid Arthritis, Osteomyelitis Neurologic Denies history of Dementia, Neuropathy, Quadriplegia, Paraplegia, Seizure Disorder Oncologic Denies history of Received Chemotherapy, Received Radiation Psychiatric Denies history of Anorexia/bulimia, Confinement Anxiety Hospitalization/Surgery History - bil TKA. -  Moh's procedure right leg 08/17/21. - bil cataract extractions. - nasal fx surgery. Medical A Surgical  History Notes nd Gastrointestinal Has a large hiatal hernia. Has Chrons Disease., GERD, pancreatic insufficiency Endocrine Pancreatic Insufficiency Genitourinary stage 3 Chronic Kidney disease Integumentary (Skin) Had Squamous Cell (skin cancer). Had a skin graft on Right knee (area) Musculoskeletal Had Bil knee replacements, hx cervical spine fx, sternal fx, thoracis spine fx Neurologic left foot drop Oncologic Had Squamous Cell (skin cell) Objective Constitutional She is hypertensive, but asymptomatic.Marland Kitchen No acute distress. Vitals Time Taken: 1:10 AM, Height: 60 in, Weight: 101 lbs, BMI: 19.7, Temperature: 97.9 F, Pulse: 93 bpm, Respiratory Rate: 20 breaths/min, Blood Pressure: 153/80 mmHg. Respiratory Normal work of breathing on room air. General Notes: 12/07/2021: The tissue where her wound had been, which she was protecting with a Band-Aid, broke down considerably. She now has a large wound that is irregular with extensive slough, involving the fat layer. She also developed a blister on her toe that has slough and eschar present. She also pointed out lump just above her left knee that she is concerned about, as she does have a history of multiple skin cancers. Integumentary (Hair, Skin) Wound #2 status is Open. Original cause of wound was Surgical Injury. The date acquired was: 08/17/2021. The wound has been in treatment 11 weeks. The wound is located on the Right,Lateral Lower Leg. The wound measures 9cm length x 5cm width x 0.1cm depth; 35.343cm^2 area and 3.534cm^3 volume. There is Fat Layer (Subcutaneous Tissue) exposed. There is no tunneling or undermining noted. There is a large amount of serous drainage noted. There is small (1- 33%) red granulation within the wound bed. There is a medium (34-66%) amount of necrotic tissue within the wound bed including Eschar and Adherent  Slough. [name], [name] [name] ([id number]) [id number] Page 10 of 14 The periwound skin appearance had no abnormalities noted for texture. The periwound skin appearance had no abnormalities noted for moisture. The periwound skin appearance had no abnormalities noted for color. Periwound temperature was noted as No Abnormality. Wound #3 status is Open. Original cause of wound was Blister. The date acquired was: 11/23/2021. The wound is located on the T Third. The wound measures oe 0.6cm length x 0.4cm width x 0.1cm depth; 0.188cm^2 area and 0.019cm^3 volume. There is no tunneling or undermining noted. There is a medium amount of serous drainage noted. The wound margin is flat and intact. There is medium (34-66%) granulation within the wound bed. There is a medium (34-66%) amount of necrotic tissue within the wound bed including Adherent Slough. The periwound skin appearance had no abnormalities noted for color. The periwound skin appearance exhibited: Maceration. The periwound skin appearance did not exhibit: Dry/Scaly. Wound #4 status is Open. Original cause of wound was Other Lesion. The date acquired was: 11/23/2021. The wound is located on the Left Knee. The wound measures 0.4cm length x 0.4cm width x 0.1cm depth; 0.126cm^2 area and 0.013cm^3 volume. There is no tunneling or undermining noted. There is a medium amount of serous drainage noted. There is medium (34-66%) red, pink granulation within the wound bed. There is a medium (34-66%) amount of necrotic tissue within the wound bed including Eschar and Adherent Slough. The periwound skin appearance had no abnormalities noted for texture. The periwound skin appearance had no abnormalities noted for color. The periwound skin appearance exhibited: Dry/Scaly. The periwound skin appearance did not exhibit: Maceration. 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 There was a Excisional Skin/Subcutaneous Tissue Debridement with a total area of 45 sq cm performed by [name], MD. With the following instrument(s): Curette to remove Non-Viable tissue/material. Material removed includes Subcutaneous Tissue and Slough and after achieving pain control using Lidocaine 5% topical ointment. 1 specimen was taken by a Tissue Culture and sent to the lab per facility protocol. A time out was conducted at 13:39, prior to the start of the procedure. A Minimum amount of bleeding was controlled with Pressure. The procedure was tolerated well. Post Debridement Measurements: 9cm length x 5cm width x 0.1cm depth; 3.534cm^3 volume. Character of Wound/Ulcer Post Debridement requires further debridement. Severity of Tissue Post Debridement is: Fat layer exposed. Post procedure Diagnosis Wound #2: Same as Pre-Procedure General Notes: scribed for Dr. [name] by Blanche East, RN. Wound #3 Pre-procedure diagnosis of Wound #3 is a Pressure Ulcer located on the T Third . There was a Selective/Open Wound Non-Viable Tissue Debridement with a oe total area of 0.24 sq cm performed by [name], MD. With the following instrument(s): Curette to remove Non-Viable tissue/material. Material removed includes Eschar and Slough and after achieving pain control using Lidocaine 5% topical ointment. No specimens were taken. A time out was conducted at 13:39, prior to the start of the procedure. A Minimum amount of bleeding was  controlled with Pressure. The procedure was tolerated well. Post Debridement Measurements: 0.6cm length x 0.4cm width x 0.1cm depth; 0.019cm^3 volume. Post debridement Stage noted as Category/Stage II. Character of Wound/Ulcer Post Debridement requires further debridement. Post procedure Diagnosis Wound #3: Same as Pre-Procedure General Notes: Scribed for Dr. [name] by Blanche East, RN. Wound #4 Pre-procedure diagnosis of Wound #4 is a Lesion located on the Left Knee . There was a biopsy performed by [name], MD. There was a biopsy performed on Wound Bed. The skin was cleansed and prepped with anti-septic followed by pain control using Lidocaine Injectable: 1%. Utilizing a 4 mm tissue punch, tissue was removed at its base with the following instrument(s): Forceps, Other, and Scissors and sent to pathology. There was a Minimum amount of bleeding. A time out was conducted at 14:05, prior to the start of the procedure. Post procedure Diagnosis Wound #4: Same as Pre-Procedure Plan Follow-up Appointments: Return Appointment in 1 week. - Dr. [name] Rm 1 Anesthetic: Wound #4 Left Knee: (In clinic) Topical Lidocaine 5% applied to wound bed - in clinic, prior to debridement Bathing/ Shower/ Hygiene: May shower with protection but do not get wound dressing(s) wet. - cover dressing on Right leg to prevent dressing from getting wet. Keep dressing dry at all times. Edema Control - Lymphedema / SCD / Other: Elevate legs to the level of the heart or above for 30 minutes daily and/or when sitting, a frequency of: Avoid standing for long periods of time. Moisturize legs daily. Laboratory ordered were: Aerobic culture-specimen not specified - Non-healing wound to Rt LAT LL, Tiss Path Bx report - Left knee biopsy DAUNA, ZISKA [name] ([id number]) [id number] Page 11 of 14 The following medication(s) was prescribed: doxycycline hyclate oral 100 mg tablet 1 tab p.o. twice daily x  10 days; separate from calcium and multivitamin by 6 hours starting 12/07/2021 WOUND #2: - Lower Leg  Wound Laterality: Right, Lateral Cleanser: Soap and Water Discharge Instructions: May shower and wash wound with dial antibacterial soap and water prior to dressing change. Cleanser: Wound Cleanser Discharge Instructions: Cleanse the wound with wound cleanser prior to applying a clean dressing using gauze sponges, not tissue or cotton balls. Peri-Wound Care: Sween Lotion (Moisturizing lotion) Discharge Instructions: Apply moisturizing lotion as directed Prim Dressing: Sorbalgon AG Dressing, 4x4 (in/in) ary Discharge Instructions: Apply to wound bed as instructed Secondary Dressing: Zetuvit Plus Silicone Border Dressing 5x5 (in/in) Discharge Instructions: Apply silicone border over primary dressing as directed. Secured With: Transpore Surgical T ape, 2x10 (in/yd) Discharge Instructions: Secure dressing with tape as directed. Com pression Wrap: ThreePress (3 layer compression wrap) Discharge Instructions: Apply three layer compression as directed. WOUND #3: - T Third Wound Laterality: oe Cleanser: Soap and Water Discharge Instructions: May shower and wash wound with dial antibacterial soap and water prior to dressing change. Cleanser: Wound Cleanser Discharge Instructions: Cleanse the wound with wound cleanser prior to applying a clean dressing using gauze sponges, not tissue or cotton balls. Peri-Wound Care: Sween Lotion (Moisturizing lotion) Discharge Instructions: Apply moisturizing lotion as directed Prim Dressing: Sorbalgon AG Dressing, 4x4 (in/in) ary Discharge Instructions: Apply to wound bed as instructed Secondary Dressing: Zetuvit Plus Silicone Border Dressing 5x5 (in/in) Discharge Instructions: Apply silicone border over primary dressing as directed. WOUND #4: - Knee Wound Laterality: Left Cleanser: Soap and Water Discharge Instructions: May shower and wash wound with dial  antibacterial soap and water prior to dressing change. Cleanser: Wound Cleanser Discharge Instructions: Cleanse the wound with wound cleanser prior to applying a clean dressing using gauze sponges, not tissue or cotton balls. Peri-Wound Care: Sween Lotion (Moisturizing lotion) Discharge Instructions: Apply moisturizing lotion as directed Prim Dressing: Sorbalgon AG Dressing, 4x4 (in/in) ary Discharge Instructions: Apply to wound bed as instructed Secondary Dressing: Zetuvit Plus Silicone Border Dressing 5x5 (in/in) Discharge Instructions: Apply silicone border over primary dressing as directed. 12/07/2021: The tissue where her wound had been, which she was protecting with a Band-Aid, broke down considerably. She now has a large wound that is irregular with extensive slough, involving the fat layer. She also developed a blister on her toe that has slough and eschar present. She also pointed out lump just above her left knee that she is concerned about, as she does have a history of multiple skin cancers. [name] used a curette to debride slough and nonviable subcutaneous tissue from the right lateral leg and eschar and slough from the right third toe. [name] took a culture of the right lateral leg wound to see if there are is an infectious reason for the tissue breakdown. She previously grew MRSA so [name] have empirically prescribed doxycycline and will tailor antibiotic therapy appropriately based on culture data. [name] performed a punch biopsy of the lesion on her left upper leg. We will follow- up on the pathology results and make appropriate referrals, if indicated. We will dress all of these with silver alginate and apply 3 layer compression to the right leg and a foam border dressing to the toe and thigh. Electronic Signature(s) Signed: 12/07/2021 3:24:17 PM By: [name] MD FACS Entered By: [name] on 12/07/2021  15:24:17 -------------------------------------------------------------------------------- HxROS Details Patient Name: Date of Service: [name]. 12/07/2021 1:15 PM Medical Record Number: [id number] Patient Account Number: [id number] Date of Birth/Sex: Treating RN: October 28, 1945 (76 y.o. F) Primary Care Provider: Domingo Mend Other Clinician: Referring Provider: Treating Provider/Extender: Kerry Fort , Magdalen Spatz  in Treatment: 11 Information Obtained From Patient Eyes Medical History: Positive for: Cataracts - Had cataract surgery Negative for: Glaucoma; Optic Neuritis Ear/Nose/Mouth/Throat Medical History: Positive for: Chronic sinus problems/congestion - Post Nasal drip Negative for: Middle ear problems LARUTH, HANGER [name] ([id number]) [id number] Page 12 of 14 Hematologic/Lymphatic Medical History: Negative for: Anemia; Hemophilia; Human Immunodeficiency Virus; Lymphedema; Sickle Cell Disease Respiratory Medical History: Negative for: Aspiration; Asthma; Chronic Obstructive Pulmonary Disease (COPD); Pneumothorax; Sleep Apnea; Tuberculosis Cardiovascular Medical History: Positive for: Hypertension Gastrointestinal Medical History: Positive for: Crohns - Has Chrons Disease Negative for: Cirrhosis ; Colitis; Hepatitis A; Hepatitis B; Hepatitis C Past Medical History Notes: Has a large hiatal hernia. Has Chrons Disease., GERD, pancreatic insufficiency Endocrine Medical History: Negative for: Type [name] Diabetes; Type II Diabetes Past Medical History Notes: Pancreatic Insufficiency Genitourinary Medical History: Negative for: End Stage Renal Disease Past Medical History Notes: stage 3 Chronic Kidney disease Immunological Medical History: Negative for: Lupus Erythematosus; Raynauds; Scleroderma Integumentary (Skin) Medical History: Past Medical History Notes: Had Squamous Cell (skin cancer). Had a skin graft on Right  knee (area) Musculoskeletal Medical History: Positive for: Osteoarthritis - Knees Negative for: Gout; Rheumatoid Arthritis; Osteomyelitis Past Medical History Notes: Had Bil knee replacements, hx cervical spine fx, sternal fx, thoracis spine fx Neurologic Medical History: Negative for: Dementia; Neuropathy; Quadriplegia; Paraplegia; Seizure Disorder Past Medical History Notes: left foot drop Oncologic Medical History: Negative for: Received Chemotherapy; Received Radiation Past Medical History Notes: Had Squamous Cell (skin cell) Psychiatric Medical History: Negative for: Anorexia/bulimia; Confinement Anxiety HBO Extended History Items Ear/Nose/Mouth/Throat: EyesMARIO, [name] ([id number]) [id number] Page 13 of 14 Eyes: Chronic sinus Cataracts problems/congestion Immunizations Pneumococcal Vaccine: Received Pneumococcal Vaccination: Yes Received Pneumococcal Vaccination On or After 60th Birthday: Yes Tetanus Vaccine: Last tetanus shot: 11/16/2011 Implantable Devices None Hospitalization / Surgery History Type of Hospitalization/Surgery bil TKA Moh's procedure right leg 08/17/21 bil cataract extractions nasal fx surgery Family and Social History Cancer: No; Diabetes: Yes - Father; Heart Disease: Yes - Father; Hereditary Spherocytosis: No; Hypertension: Yes - Father; Kidney Disease: No; Lung Disease: No; Seizures: No; Stroke: No; Thyroid Problems: No; Tuberculosis: No; Former smoker - quit 30 years ago; Marital Status - Married; Alcohol Use: Never; Drug Use: No History; Caffeine Use: Rarely; Financial Concerns: No; Food, Clothing or Shelter Needs: No; Support System Lacking: No; Transportation Concerns: No Electronic Signature(s) Signed: 12/07/2021 4:21:30 PM By: [name] MD FACS Entered By: [name] on 12/07/2021 15:18:21 -------------------------------------------------------------------------------- SuperBill  Details Patient Name: Date of Service: [name]. 12/07/2021 Medical Record Number: [id number] Patient Account Number: [id number] Date of Birth/Sex: Treating RN: [birth year] (76 y.o. F) Primary Care Provider: Domingo Mend Other Clinician: Referring Provider: Treating Provider/Extender: Gevena Cotton Weeks in Treatment: 11 Diagnosis Coding ICD-10 Codes Code Description [phone] 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d L97.512 Non-pressure chronic ulcer of other part of right foot with fat layer exposed L98.9 Disorder of the skin and subcutaneous tissue, unspecified I10 Essential (primary) hypertension K50.90 Crohn's disease, unspecified, without complications I69 Unspecified severe protein-calorie malnutrition Facility Procedures : CPT4 Code: [id number] Description: 32440 - DEB SUBQ TISSUE 20 SQ CM/< ICD-10 Diagnosis Description N02.725 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d Modifier: Quantity: 1 : CPT4 Code: [id number] Description: 74259 - DEB SUBQ TISS EA ADDL 20CM ICD-10 Diagnosis Description L97.812 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d Modifier: Quantity: 2 :  CPT4 Code: [id number] Description: 54098 - DEBRIDE WOUND 1ST 20 SQ CM OR < ICD-10 Diagnosis Description L97.512 Non-pressure chronic ulcer of other part of right foot with fat layer exposed Modifier: Quantity: 1 : [place], BER CPT4 Code: [id number] TINE [name] ([id number] Description: 11104-Punch biopsy of skin (including simple closure, when performed) single lesion ICD-10 Diagnosis Description L98.9 Disorder of the skin and subcutaneous tissue, unspecified 4) [id number] Modifier: [id number] Quantity: 1 27.pdf Page 14 of 14 Physician Procedures : CPT4 Code Description Modifier [id number] 40102 - WC PHYS LEVEL 4 - EST PT 25 ICD-10 Diagnosis Description V25.366  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d L97.512 Non-pressure chronic ulcer of other part of right  foot with fat layer exposed L98.9 Disorder of the skin and subcutaneous tissue, unspecified E43 Unspecified severe protein-calorie malnutrition Quantity: 1 : [id number] 11042 - WC PHYS SUBQ TISS 20 SQ CM ICD-10 Diagnosis Description L97.812 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d Quantity: 1 : [id number] 11045 - WC PHYS SUBQ TISS EA ADDL 20 CM ICD-10 Diagnosis Description L97.812 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d Quantity: 2 : [id number] 95188 - WC PHYS DEBR WO ANESTH 20 SQ CM ICD-10 Diagnosis Description L97.512 Non-pressure chronic ulcer of other part of right foot with fat layer exposed Quantity: 1 : 11104 Punch biopsy of skin (including simple closure, when performed) single lesion ICD-10 Diagnosis Description L98.9 Disorder of the skin and subcutaneous tissue, unspecified Quantity: 1 Electronic Signature(s) Signed: 12/07/2021 3:25:12 PM By: [name] MD FACS Entered By: [name] on 12/07/2021 15:25:12

## 2021-12-10 NOTE — Progress Notes (Signed)
[name] ([id number]) [id number] Page 1 of 11 Visit Report for 12/07/2021 Arrival Information Details Patient Name: Date of Service: [name], [name] 12/07/2021 1:15 PM Medical Record Number: [id number] Patient Account Number: [id number] Date of Birth/Sex: Treating RN: Aug 31, 1945 (76 y.o. F) Primary Care : Domingo Mend Other Clinician: Referring : Treating /Extender: Caprice Kluver in Treatment: 11 Visit Information History Since Last Visit All ordered tests and consults were completed: No Patient Arrived: Ambulatory Added or deleted any medications: No Arrival Time: 13:13 Any new allergies or adverse reactions: No Accompanied By: self Had a fall or experienced change in No Transfer Assistance: None activities of daily living that may affect Patient Identification Verified: Yes risk of falls: Secondary Verification Process Completed: Yes Signs or symptoms of abuse/neglect since last visito No Patient Requires Transmission-Based Precautions: No Hospitalized since last visit: No Patient Has Alerts: No Implantable device outside of the clinic excluding No cellular tissue based products placed in the center since last visit: Pain Present Now: Yes Electronic Signature(s) Signed: 12/07/2021 3:24:17 PM By: Worthy Rancher Entered By: Worthy Rancher on 12/07/2021 13:14:20 -------------------------------------------------------------------------------- Encounter Discharge Information Details Patient Name: Date of Service: April Austin [name]. 12/07/2021 1:15 PM Medical Record Number: [id number] Patient Account Number: [id number] Date of Birth/Sex: Treating RN: Aug 16, 1945 (76 y.o. [name] Primary Care : Domingo Mend Other Clinician: Referring : Treating /Extender: Gevena Cotton Weeks in Treatment: 11 Encounter Discharge Information Items Post  Procedure Vitals Discharge Condition: Stable Temperature (F): 97.9 Ambulatory Status: Ambulatory Pulse (bpm): 93 Discharge Destination: Home Respiratory Rate (breaths/min): 20 Transportation: Private Auto Blood Pressure (mmHg): 153/80 Accompanied By: self Schedule Follow-up Appointment: Yes Clinical Summary of Care: Electronic Signature(s) Signed: 12/10/2021 5:02:57 PM By: Blanche East RN Entered By: Blanche East on 12/07/2021 14:20:09 Haskell Flirt [name] ([id number]) [id number] Page 2 of 11 -------------------------------------------------------------------------------- Lower Extremity Assessment Details Patient Name: Date of Service: April, [name] 12/07/2021 1:15 PM Medical Record Number: [id number] Patient Account Number: [id number] Date of Birth/Sex: Treating RN: 08-Sep-1945 (76 y.o. [name] Primary Care : Domingo Mend Other Clinician: Referring : Treating /Extender: Gevena Cotton Weeks in Treatment: 11 Edema Assessment Assessed: [Left: No] [Right: No] Edema: [Left: N] [Right: o] Calf Left: Right: Point of Measurement: From Medial Instep 29 cm 29.4 cm Ankle Left: Right: Point of Measurement: From Medial Instep 19 cm 19 cm Vascular Assessment Pulses: Dorsalis Pedis Palpable: [Left:Yes] [Right:Yes] Electronic Signature(s) Signed: 12/10/2021 5:02:57 PM By: Blanche East RN Entered By: Blanche East on 12/07/2021 13:20:57 -------------------------------------------------------------------------------- Multi Wound Chart Details Patient Name: Date of Service: April Austin [name]. 12/07/2021 1:15 PM Medical Record Number: [id number] Patient Account Number: [id number] Date of Birth/Sex: Treating RN: 05/10/1945 (76 y.o. F) Primary Care : Domingo Mend Other Clinician: Referring : Treating /Extender: Gevena Cotton Weeks in Treatment:  11 Vital Signs Height(in): 60 Pulse(bpm): 93 Weight(lbs): 101 Blood Pressure(mmHg): 153/80 Body Mass Index(BMI): 19.7 Temperature(F): 97.9 Respiratory Rate(breaths/min): 20 [2:Photos:] [[id number] Page 3 of 11] Right, Lateral Lower Leg T Third oe Left Knee Wound Location: Surgical Injury Blister Other Lesion Wounding Event: Open Surgical Wound Pressure Ulcer Lesion Primary Etiology: Venous Leg Ulcer N/A N/A Secondary Etiology: Cataracts, Chronic sinus Cataracts, Chronic sinus Cataracts, Chronic sinus Comorbid History: problems/congestion, Hypertension, problems/congestion, Hypertension, problems/congestion, Hypertension, Crohns, Osteoarthritis Crohns, Osteoarthritis Crohns, Osteoarthritis 08/17/2021 11/23/2021 11/23/2021 Date Acquired: 11 0 0 Weeks of Treatment: Open Open Open Wound Status:  No No No Wound Recurrence: Yes No No Clustered Wound: 9x5x0.1 0.6x0.4x0.1 0.4x0.4x0.1 Measurements L x W x D (cm) 35.343 0.188 0.126 A (cm) : rea 3.534 0.019 0.013 Volume (cm) : -94.80% N/A N/A % Reduction in A rea: -94.80% N/A N/A % Reduction in Volume: Full Thickness Without Exposed Category/Stage II Full Thickness Without Exposed Classification: Support Structures Support Structures Large Medium Medium Exudate A mount: Serous Serous Serous Exudate Type: Geophysical data processor Exudate Color: N/A Flat and Intact N/A Wound Margin: Small (1-33%) Medium (34-66%) Medium (34-66%) Granulation A mount: Red N/A Red, Pink Granulation Quality: Medium (34-66%) Medium (34-66%) Medium (34-66%) Necrotic A mount: Eschar, Adherent [place], Adherent Slough Necrotic Tissue: Fat Layer (Subcutaneous Tissue): Yes N/A N/A Exposed Structures: Fascia: No Tendon: No Muscle: No Joint: No Bone: No Large (67-100%) N/A N/A Epithelialization: Debridement - Excisional Debridement - Selective/Open Wound Debridement - Selective/Open  Wound Debridement: Pre-procedure Verification/Time Out 13:39 13:39 13:39 Taken: Lidocaine 5% topical ointment Lidocaine 5% topical ointment Lidocaine 5% topical ointment Pain Control: Subcutaneous, Psychologist, prison and probation services, Psychologist, prison and probation services Tissue Debrided: Skin/Subcutaneous Tissue Non-Viable Tissue Non-Viable Tissue Level: 45 0.24 0.16 Debridement A (sq cm): rea Curette Curette Curette Instrument: Minimum Minimum Minimum Bleeding: Pressure Pressure Pressure Hemostasis A chieved: Procedure was tolerated well Procedure was tolerated well Procedure was tolerated well Debridement Treatment Response: 9x5x0.1 0.6x0.4x0.1 0.4x0.4x0.1 Post Debridement Measurements L x W x D (cm) 3.534 0.019 0.013 Post Debridement Volume: (cm) N/A Category/Stage II N/A Post Debridement Stage: Excoriation: No No Abnormalities Noted Periwound Skin Texture: Induration: No Callus: No Crepitus: No Rash: No Scarring: No Dry/Scaly: No Maceration: Yes Dry/Scaly: Yes Periwound Skin Moisture: Dry/Scaly: No Maceration: No Atrophie Blanche: No No Abnormalities Noted No Abnormalities Noted Periwound Skin Color: Cyanosis: No Ecchymosis: No Erythema: No Hemosiderin Staining: No Mottled: No Pallor: No Rubor: No No Abnormality N/A N/A Temperature: Debridement Debridement Debridement Procedures Performed: Treatment Notes Wound #2 (Lower Leg) Wound Laterality: Right, Lateral Cleanser Soap and Water Discharge Instruction: May shower and wash wound with dial antibacterial soap and water prior to dressing change. Wound Cleanser Discharge Instruction: Cleanse the wound with wound cleanser prior to applying a clean dressing using gauze sponges, not tissue or cotton balls. Peri-Wound Care Sween Lotion (Moisturizing lotion) Discharge Instruction: Apply moisturizing lotion as directed Topical TANEYA, CONKEL [name] ([id number]) [id number] Page 4 of 11 Primary Dressing Sorbalgon  AG Dressing, 4x4 (in/in) Discharge Instruction: Apply to wound bed as instructed Secondary Dressing Zetuvit Plus Silicone Border Dressing 5x5 (in/in) Discharge Instruction: Apply silicone border over primary dressing as directed. Secured With Transpore Surgical Tape, 2x10 (in/yd) Discharge Instruction: Secure dressing with tape as directed. Compression Wrap ThreePress (3 layer compression wrap) Discharge Instruction: Apply three layer compression as directed. Compression Stockings Add-Ons Wound #3 (Toe Third) Cleanser Soap and Water Discharge Instruction: May shower and wash wound with dial antibacterial soap and water prior to dressing change. Wound Cleanser Discharge Instruction: Cleanse the wound with wound cleanser prior to applying a clean dressing using gauze sponges, not tissue or cotton balls. Peri-Wound Care Sween Lotion (Moisturizing lotion) Discharge Instruction: Apply moisturizing lotion as directed Topical Primary Dressing Sorbalgon AG Dressing, 4x4 (in/in) Discharge Instruction: Apply to wound bed as instructed Secondary Dressing Zetuvit Plus Silicone Border Dressing 5x5 (in/in) Discharge Instruction: Apply silicone border over primary dressing as directed. Secured With Compression Wrap Compression Stockings Add-Ons Wound #4 (Knee) Wound Laterality: Left Cleanser Soap and Water Discharge Instruction: May shower and wash wound with dial antibacterial soap and water prior to dressing change.  Wound Cleanser Discharge Instruction: Cleanse the wound with wound cleanser prior to applying a clean dressing using gauze sponges, not tissue or cotton balls. Peri-Wound Care Sween Lotion (Moisturizing lotion) Discharge Instruction: Apply moisturizing lotion as directed Topical Primary Dressing Sorbalgon AG Dressing, 4x4 (in/in) Discharge Instruction: Apply to wound bed as instructed Secondary Dressing Zetuvit Plus Silicone Border Dressing 5x5 (in/in) Discharge  Instruction: Apply silicone border over primary dressing as directed. Secured With Compression SAPHRONIA, OZDEMIR [name] ([id number]) [id number] Page 5 of 11 Compression Stockings Add-Ons Electronic Signature(s) Signed: 12/07/2021 2:53:50 PM By: [name] MD FACS Entered By: [name] on 12/07/2021 14:53:50 -------------------------------------------------------------------------------- Multi-Disciplinary Care Plan Details Patient Name: Date of Service: April Austin [name]. 12/07/2021 1:15 PM Medical Record Number: [id number] Patient Account Number: [id number] Date of Birth/Sex: Treating RN: 11-23-1945 (76 y.o. [name] Primary Care Myli Pae: Domingo Mend Other Clinician: Referring Dashel Goines: Treating Haiven Nardone/Extender: Caprice Kluver in Treatment: [place] reviewed with physician Active Inactive Abuse / Safety / Falls / Self Care Management Nursing Diagnoses: History of Falls Potential for falls Goals: Patient will not experience any injury related to falls Date Initiated: 12/07/2021 Target Resolution Date: 02/01/2022 Goal Status: Active Patient/caregiver will verbalize/demonstrate measures taken to prevent injury and/or falls Date Initiated: 09/20/2021 Date Inactivated: 10/19/2021 Target Resolution Date: 10/18/2021 Goal Status: Met Interventions: Assess fall risk on admission and as needed Assess impairment of mobility on admission and as needed per policy Notes: Pressure Nursing Diagnoses: Knowledge deficit related to causes and risk factors for pressure ulcer development Goals: Patient will remain free of pressure ulcers Date Initiated: 12/07/2021 Target Resolution Date: 02/01/2022 Goal Status: Active Interventions: Assess potential for pressure ulcer upon admission and as needed Notes: Electronic Signature(s) Signed: 12/10/2021 5:02:57 PM By: Blanche East RN Entered By:  Blanche East on 12/07/2021 14:33:33 Dahlem, Parks Ranger ([id number]) [id number] Page 6 of 11 -------------------------------------------------------------------------------- Pain Assessment Details Patient Name: Date of Service: April, [name] 12/07/2021 1:15 PM Medical Record Number: [id number] Patient Account Number: [id number] Date of Birth/Sex: Treating RN: 11/10/1945 (76 y.o. F) Primary Care Ling Flesch: Domingo Mend Other Clinician: Referring Doroteo Nickolson: Treating Latrise Bowland/Extender: Gevena Cotton Weeks in Treatment: 11 Active Problems Location of Pain Severity and Description of Pain Patient Has Paino Yes Site Locations Rate the pain. Current Pain Level: 3 Worst Pain Level: 10 Least Pain Level: 0 Tolerable Pain Level: 2 Pain Management and Medication Current Pain Management: Electronic Signature(s) Signed: 12/07/2021 3:24:17 PM By: Worthy Rancher Entered By: Worthy Rancher on 12/07/2021 13:15:01 -------------------------------------------------------------------------------- Patient/Caregiver Education Details Patient Name: Date of Service: April Austin [name]. 12/1/2023andnbsp1:15 PM Medical Record Number: [id number] Patient Account Number: [id number] Date of Birth/Gender: Treating RN: 1945-09-06 (76 y.o. [name] Primary Care Physician: Domingo Mend Other Clinician: Referring Physician: Treating Physician/Extender: Caprice Kluver in Treatment: 11 Education Assessment Education Provided To: Patient Education Topics Provided Wound/Skin Impairment: Methods: Explain/Verbal Responses: Reinforcements needed, State content correctly IDELL, HISSONG [name] ([id number]) [id number] Page 7 of 11 Electronic Signature(s) Signed: 12/10/2021 5:02:57 PM By: Blanche East RN Entered By: Blanche East on 12/07/2021  14:33:47 -------------------------------------------------------------------------------- Wound Assessment Details Patient Name: Date of Service: April, [name] 12/07/2021 1:15 PM Medical Record Number: [id number] Patient Account Number: [id number] Date of Birth/Sex: Treating RN: [birth year] (76 y.o. [name] Primary Care Ermalinda Joubert: Domingo Mend Other Clinician: Referring Vishruth Seoane: Treating Angeleah Labrake/Extender: Gevena Cotton Weeks in Treatment: 11 Wound Status  Wound Number: 2 Primary Open Surgical Wound Etiology: Wound Location: Right, Lateral Lower Leg Secondary Venous Leg Ulcer Wounding Event: Surgical Injury Etiology: Date Acquired: 08/17/2021 Wound Status: Open Weeks Of Treatment: 11 Comorbid Cataracts, Chronic sinus problems/congestion, Hypertension, Clustered Wound: Yes History: Crohns, Osteoarthritis Photos Wound Measurements Length: (cm) 9 Width: (cm) 5 Depth: (cm) 0.1 Area: (cm) 35.343 Volume: (cm) 3.534 % Reduction in Area: -94.8% % Reduction in Volume: -94.8% Epithelialization: Large (67-100%) Tunneling: No Undermining: No Wound Description Classification: Full Thickness Without Exposed Suppor Exudate Amount: Large Exudate Type: Serous Exudate Color: amber t Structures Foul Odor After Cleansing: No Slough/Fibrino Yes Wound Bed Granulation Amount: Small (1-33%) Exposed Structure Granulation Quality: Red Fascia Exposed: No Necrotic Amount: Medium (34-66%) Fat Layer (Subcutaneous Tissue) Exposed: Yes Necrotic Quality: Eschar, Adherent Slough Tendon Exposed: No Muscle Exposed: No Joint Exposed: No Bone Exposed: No Periwound Skin Texture Texture Color No Abnormalities Noted: Yes No Abnormalities Noted: Yes Moisture Temperature / Pain No Abnormalities Noted: Yes Temperature: No Abnormality Vu, [place] [name] ([id number]) [id number] Page 8 of 11 Treatment Notes Wound #2 (Lower Leg) Wound  Laterality: Right, Lateral Cleanser Soap and Water Discharge Instruction: May shower and wash wound with dial antibacterial soap and water prior to dressing change. Wound Cleanser Discharge Instruction: Cleanse the wound with wound cleanser prior to applying a clean dressing using gauze sponges, not tissue or cotton balls. Peri-Wound Care Sween Lotion (Moisturizing lotion) Discharge Instruction: Apply moisturizing lotion as directed Topical Primary Dressing Sorbalgon AG Dressing, 4x4 (in/in) Discharge Instruction: Apply to wound bed as instructed Secondary Dressing Zetuvit Plus Silicone Border Dressing 5x5 (in/in) Discharge Instruction: Apply silicone border over primary dressing as directed. Secured With Transpore Surgical Tape, 2x10 (in/yd) Discharge Instruction: Secure dressing with tape as directed. Compression Wrap ThreePress (3 layer compression wrap) Discharge Instruction: Apply three layer compression as directed. Compression Stockings Add-Ons Electronic Signature(s) Signed: 12/10/2021 5:02:57 PM By: Blanche East RN Entered By: Blanche East on 12/07/2021 13:31:44 -------------------------------------------------------------------------------- Wound Assessment Details Patient Name: Date of Service: April, [name] 12/07/2021 1:15 PM Medical Record Number: [id number] Patient Account Number: [id number] Date of Birth/Sex: Treating RN: 05/26/1945 (75 y.o. [name] Primary Care : Domingo Mend Other Clinician: Referring : Treating /Extender: Gevena Cotton Weeks in Treatment: 11 Wound Status Wound Number: 3 Primary Pressure Ulcer Etiology: Wound Location: T Third oe Wound Open Wounding Event: Blister Status: Date Acquired: 11/23/2021 Comorbid Cataracts, Chronic sinus problems/congestion, Hypertension, Weeks Of Treatment: 0 History: Crohns, Osteoarthritis Clustered Wound: No Photos CHAMYA, HUNTON [name]  ([id number]) [id number] Page 9 of 11 Wound Measurements Length: (cm) 0.6 Width: (cm) 0.4 Depth: (cm) 0.1 Area: (cm) 0.188 Volume: (cm) 0.019 % Reduction in Area: % Reduction in Volume: Tunneling: No Undermining: No Wound Description Classification: Category/Stage II Wound Margin: Flat and Intact Exudate Amount: Medium Exudate Type: Serous Exudate Color: amber Foul Odor After Cleansing: No Slough/Fibrino Yes Wound Bed Granulation Amount: Medium (34-66%) Necrotic Amount: Medium (34-66%) Necrotic Quality: Adherent Slough Periwound Skin Texture Texture Color No Abnormalities Noted: No No Abnormalities Noted: Yes Moisture No Abnormalities Noted: No Dry / Scaly: No Maceration: Yes Treatment Notes Wound #3 (Toe Third) Cleanser Soap and Water Discharge Instruction: May shower and wash wound with dial antibacterial soap and water prior to dressing change. Wound Cleanser Discharge Instruction: Cleanse the wound with wound cleanser prior to applying a clean dressing using gauze sponges, not tissue or cotton balls. Peri-Wound Care Sween Lotion (Moisturizing lotion) Discharge Instruction: Apply moisturizing lotion as directed Topical Primary  Dressing Sorbalgon AG Dressing, 4x4 (in/in) Discharge Instruction: Apply to wound bed as instructed Secondary Dressing Zetuvit Plus Silicone Border Dressing 5x5 (in/in) Discharge Instruction: Apply silicone border over primary dressing as directed. Secured With Compression Wrap Compression Stockings Environmental education officer) Signed: 12/10/2021 5:02:57 PM By: Blanche East RN Say, Khristina 12/10/2021 5:02:57 PM By: Blanche East RN Signed: I ([id number]) [id number] Page 10 of 11 Entered By: Blanche East on 12/07/2021 13:32:13 -------------------------------------------------------------------------------- Wound Assessment Details Patient Name: Date of Service: SHARMA, LAWRANCE 12/07/2021 1:15 PM Medical Record Number: [id number] Patient Account Number: [id number] Date of Birth/Sex: Treating RN: 02/04/1945 (76 y.o. Iver Nestle, Jamie Primary Care Patton Swisher: Domingo Mend Other Clinician: Referring Roland Prine: Treating Ladona Rosten/Extender: Gevena Cotton Weeks in Treatment: 11 Wound Status Wound Number: 4 Primary Lesion Etiology: Wound Location: Left Knee Wound Open Wounding Event: Other Lesion Status: Date Acquired: 11/23/2021 Comorbid Cataracts, Chronic sinus problems/congestion, Hypertension, Weeks Of Treatment: 0 History: Crohns, Osteoarthritis Clustered Wound: No Photos Wound Measurements Length: (cm) 0.4 Width: (cm) 0.4 Depth: (cm) 0.1 Area: (cm) 0.126 Volume: (cm) 0.013 % Reduction in Area: % Reduction in Volume: Tunneling: No Undermining: No Wound Description Classification: Full Thickness Without Exposed Suppo Exudate Amount: Medium Exudate Type: Serous Exudate Color: amber rt Structures Foul Odor After Cleansing: No Slough/Fibrino Yes Wound Bed Granulation Amount: Medium (34-66%) Granulation Quality: Red, Pink Necrotic Amount: Medium (34-66%) Necrotic Quality: Eschar, Adherent Slough Periwound Skin Texture Texture Color No Abnormalities Noted: Yes No Abnormalities Noted: Yes Moisture No Abnormalities Noted: No Dry / Scaly: Yes Maceration: No Treatment Notes Wound #4 (Knee) Wound Laterality: Left Cleanser April, [name] [name] ([id number]) [id number] Page 11 of 11 Soap and Water Discharge Instruction: May shower and wash wound with dial antibacterial soap and water prior to dressing change. Wound Cleanser Discharge Instruction: Cleanse the wound with wound cleanser prior to applying a clean dressing using gauze sponges, not tissue or cotton balls. Peri-Wound Care Sween Lotion (Moisturizing lotion) Discharge Instruction: Apply moisturizing lotion as directed Topical Primary  Dressing Sorbalgon AG Dressing, 4x4 (in/in) Discharge Instruction: Apply to wound bed as instructed Secondary Dressing Zetuvit Plus Silicone Border Dressing 5x5 (in/in) Discharge Instruction: Apply silicone border over primary dressing as directed. Secured With Compression Wrap Compression Stockings Environmental education officer) Signed: 12/10/2021 5:02:57 PM By: Blanche East RN Entered By: Blanche East on 12/07/2021 13:32:34 -------------------------------------------------------------------------------- Vitals Details Patient Name: Date of Service: April Austin [name]. 12/07/2021 1:15 PM Medical Record Number: [id number] Patient Account Number: [id number] Date of Birth/Sex: Treating RN: 03-02-1945 (76 y.o. F) Primary Care Rayvin Abid: Domingo Mend Other Clinician: Referring Lorren Splawn: Treating Barbee Mamula/Extender: Gevena Cotton Weeks in Treatment: 11 Vital Signs Time Taken: 01:10 Temperature (F): 97.9 Height (in): 60 Pulse (bpm): 93 Weight (lbs): 101 Respiratory Rate (breaths/min): 20 Body Mass Index (BMI): 19.7 Blood Pressure (mmHg): 153/80 Reference Range: 80 - 120 mg / dl Electronic Signature(s) Signed: 12/07/2021 3:24:17 PM By: Worthy Rancher Entered By: Worthy Rancher on 12/07/2021 13:14:45

## 2021-12-17 ENCOUNTER — Encounter (HOSPITAL_BASED_OUTPATIENT_CLINIC_OR_DEPARTMENT_OTHER): Payer: Medicare Other | Admitting: Internal Medicine

## 2021-12-17 DIAGNOSIS — L97812 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d: Secondary | ICD-10-CM | POA: Diagnosis not present

## 2021-12-18 NOTE — Progress Notes (Signed)
April, OSHIRO [name] ([id number]) [id number] Page 1 of 11 Visit Report for 12/17/2021 Arrival Information Details Patient Name: Date of Service: April, ESTRIN [name]. 12/17/2021 10:30 A M Medical Record Number: [id number] Patient Account Number: [id number] Date of Birth/Sex: Treating RN: [birth year] (76 y.o. Ardelle Balls, Morey Hummingbird Primary Care Hendricks Schwandt: Domingo Mend Other Clinician: Referring Bexley Laubach: Treating Macallister Ashmead/Extender: Allene Pyo in Treatment: 12 Visit Information History Since Last Visit Added or deleted any medications: No Patient Arrived: Ambulatory Any new allergies or adverse reactions: No Arrival Time: 10:19 Had a fall or experienced change in No Accompanied By: husband activities of daily living that may affect Transfer Assistance: None risk of falls: Patient Identification Verified: Yes Signs or symptoms of abuse/neglect since last visito No Secondary Verification Process Completed: Yes Hospitalized since last visit: No Patient Requires Transmission-Based Precautions: No Implantable device outside of the clinic excluding No Patient Has Alerts: No cellular tissue based products placed in the center since last visit: Has Dressing in Place as Prescribed: Yes Has Compression in Place as Prescribed: Yes Pain Present Now: No Electronic Signature(s) Signed: 12/17/2021 4:35:13 PM By: Sharyn Creamer RN, BSN Entered By: Sharyn Creamer on 12/17/2021 10:22:04 -------------------------------------------------------------------------------- Encounter Discharge Information Details Patient Name: Date of Service: [name]. 12/17/2021 10:30 A M Medical Record Number: [id number] Patient Account Number: [id number] Date of Birth/Sex: Treating RN: 1945/11/16 (76 y.o. Harlow Ohms Primary Care Charlayne Vultaggio: Domingo Mend Other Clinician: Referring Claudie Brickhouse: Treating Ambrea Hegler/Extender: Allene Pyo in Treatment: 12 Encounter Discharge Information Items Post Procedure Vitals Discharge Condition: Stable Temperature (F): 97.5 Ambulatory Status: Ambulatory Pulse (bpm): 65 Discharge Destination: Home Respiratory Rate (breaths/min): 18 Transportation: Private Auto Blood Pressure (mmHg): 195/77 Accompanied By: self Schedule Follow-up Appointment: Yes Clinical Summary of Care: Patient Declined Electronic Signature(s) Signed: 12/17/2021 4:59:49 PM By: Adline Peals Entered By: Adline Peals on 12/17/2021 11:05:26 Amrhein, Parks Ranger ([id number]) [id number] Page 2 of 11 -------------------------------------------------------------------------------- Lower Extremity Assessment Details Patient Name: Date of Service: April, LORENZI [name]. 12/17/2021 10:30 A M Medical Record Number: [id number] Patient Account Number: [id number] Date of Birth/Sex: Treating RN: 07-23-1945 (76 y.o. Donalda Ewings Primary Care Cari Vandeberg: Domingo Mend Other Clinician: Referring Thiago Ragsdale: Treating Dayle Sherpa/Extender: Allene Pyo in Treatment: 12 Edema Assessment Assessed: [Left: No] [Right: No] Edema: [Left: N] [Right: o] Calf Left: Right: Point of Measurement: From Medial Instep 29 cm 28.4 cm Ankle Left: Right: Point of Measurement: From Medial Instep 19 cm 18.5 cm Vascular Assessment Pulses: Dorsalis Pedis Palpable: [Right:Yes] Electronic Signature(s) Signed: 12/17/2021 4:35:13 PM By: Sharyn Creamer RN, BSN Entered By: Sharyn Creamer on 12/17/2021 10:31:56 -------------------------------------------------------------------------------- Multi Wound Chart Details Patient Name: Date of Service: [name]. 12/17/2021 10:30 A M Medical Record Number: [id number] Patient Account Number: [id number] Date of Birth/Sex: Treating RN: [birth year] (76 y.o. F) Primary Care Shain Pauwels: Domingo Mend Other  Clinician: Referring Jovanni Eckhart: Treating [place] Kappes/Extender: Allene Pyo in Treatment: 12 Vital Signs Height(in): 60 Pulse(bpm): 65 Weight(lbs): 101 Blood Pressure(mmHg): 195/77 Body Mass Index(BMI): 19.7 Temperature(F): 97.5 Respiratory Rate(breaths/min): 18 [2:Photos:] [id number] Page 3 of 11] Right, Lateral Lower Leg T Third oe Left Knee Wound Location: Surgical Injury Blister Other Lesion Wounding Event: Open Surgical Wound Pressure Ulcer Lesion Primary Etiology: Venous Leg Ulcer N/A N/A Secondary Etiology: Cataracts, Chronic sinus Cataracts, Chronic sinus Cataracts, Chronic sinus Comorbid History: problems/congestion, Hypertension, problems/congestion, Hypertension, problems/congestion, Hypertension, Crohns, Osteoarthritis Crohns, Osteoarthritis Crohns, Osteoarthritis 08/17/2021  11/23/2021 11/23/2021 Date Acquired: _0 Weeks of Treatment: Open Open Open Wound Status: No No No Wound Recurrence: Yes No No Clustered Wound: 8x4.5x0.4 0.5x0.4x0.1 0.6x0.8x0.3 Measurements L x W x D (cm) 28.274 0.157 0.377 A (cm) : rea 11.31 0.016 0.113 Volume (cm) : -55.80% 16.50% -199.20% % Reduction in A rea: -523.50% 15.80% -769.20% % Reduction in Volume: Full Thickness Without Exposed Category/Stage II Full Thickness Without Exposed Classification: Support Structures Support Structures Large Medium Medium Exudate A mount: Serous Serous Serous Exudate Type: amber amber amber Exudate Color: N/A Flat and Intact N/A Wound Margin: Large (67-100%) Medium (34-66%) None Present (0%) Granulation A mount: Red N/A N/A Granulation Quality: Small (1-33%) Medium (34-66%) Large (67-100%) Necrotic A mount: Eschar Adherent Slough Eschar, Adherent Slough Necrotic Tissue: Fat Layer (Subcutaneous Tissue): Yes N/A N/A Exposed Structures: Fascia: No Tendon: No Muscle: No Joint: No Bone: No Large (67-100%) N/A  N/A Epithelialization: Debridement - Excisional N/A N/A Debridement: Pre-procedure Verification/Time Out 10:58 N/A N/A Taken: Lidocaine 5% topical ointment N/A N/A Pain Control: Subcutaneous N/A N/A Tissue Debrided: Skin/Subcutaneous Tissue N/A N/A Level: 20 N/A N/A Debridement A (sq cm): rea Curette N/A N/A Instrument: Minimum N/A N/A Bleeding: Pressure N/A N/A Hemostasis A chieved: Procedure was tolerated well N/A N/A Debridement Treatment Response: 8x4.5x0.1 N/A N/A Post Debridement Measurements L x W x D (cm) 2.827 N/A N/A Post Debridement Volume: (cm) Excoriation: No No Abnormalities Noted Periwound Skin Texture: Induration: No Callus: No Crepitus: No Rash: No Scarring: No Dry/Scaly: No Maceration: Yes Dry/Scaly: Yes Periwound Skin Moisture: Dry/Scaly: No Maceration: No Atrophie Blanche: No No Abnormalities Noted No Abnormalities Noted Periwound Skin Color: Cyanosis: No Ecchymosis: No Erythema: No Hemosiderin Staining: No Mottled: No Pallor: No Rubor: No No Abnormality N/A N/A Temperature: Debridement N/A N/A Procedures Performed: Treatment Notes Wound #2 (Lower Leg) Wound Laterality: Right, Lateral Cleanser Soap and Water Discharge Instruction: May shower and wash wound with dial antibacterial soap and water prior to dressing change. Wound Cleanser Discharge Instruction: Cleanse the wound with wound cleanser prior to applying a clean dressing using gauze sponges, not tissue or cotton balls. Peri-Wound Care Sween Lotion (Moisturizing lotion) Discharge Instruction: Apply moisturizing lotion as directed Topical KRISALYN, YANKOWSKI [name] ([id number]) [id number] Page 4 of 11 Primary Dressing IODOFLEX 0.9% Cadexomer Iodine Pad 4x6 cm Discharge Instruction: Apply to wound bed as instructed Secondary Dressing Zetuvit Plus 4x8 in Discharge Instruction: Apply over primary dressing as directed. Secured With Transpore Surgical Tape,  2x10 (in/yd) Discharge Instruction: Secure dressing with tape as directed. Compression Wrap ThreePress (3 layer compression wrap) Discharge Instruction: Apply three layer compression as directed. Compression Stockings Add-Ons Wound #3 (Toe Third) Cleanser Soap and Water Discharge Instruction: May shower and wash wound with dial antibacterial soap and water prior to dressing change. Wound Cleanser Discharge Instruction: Cleanse the wound with wound cleanser prior to applying a clean dressing using gauze sponges, not tissue or cotton balls. Peri-Wound Care Sween Lotion (Moisturizing lotion) Discharge Instruction: Apply moisturizing lotion as directed Topical Gentamicin Discharge Instruction: As directed by physician Primary Dressing Sorbalgon AG Dressing, 4x4 (in/in) Discharge Instruction: Apply to wound bed as instructed Secondary Dressing Zetuvit Plus Silicone Border Dressing 5x5 (in/in) Discharge Instruction: Apply silicone border over primary dressing as directed. Secured With Compression Wrap Compression Stockings Add-Ons Wound #4 (Knee) Wound Laterality: Left Cleanser Soap and Water Discharge Instruction: May shower and wash wound with dial antibacterial soap and water prior to dressing change. Wound Cleanser Discharge Instruction: Cleanse the wound with wound cleanser prior to  applying a clean dressing using gauze sponges, not tissue or cotton balls. Peri-Wound Care Sween Lotion (Moisturizing lotion) Discharge Instruction: Apply moisturizing lotion as directed Topical Primary Dressing Sorbalgon AG Dressing, 4x4 (in/in) Discharge Instruction: Apply to wound bed as instructed Secondary Dressing Zetuvit Plus Silicone Border Dressing 5x5 (in/in) Discharge Instruction: Apply silicone border over primary dressing as directed. April, HERTENSTEIN [name] ([id number]) [id number] Page 5 of 11 Secured With Compression Wrap Compression  Stockings Environmental education officer) Signed: 12/17/2021 5:01:06 PM By: [name] MD Entered By: [name] on 12/17/2021 11:09:41 -------------------------------------------------------------------------------- Multi-Disciplinary Care Plan Details Patient Name: Date of Service: [name]. 12/17/2021 10:30 A M Medical Record Number: [id number] Patient Account Number: [id number] Date of Birth/Sex: Treating RN: April 01, 1945 (76 y.o. Harlow Ohms Primary Care Jmari Pelc: Domingo Mend Other Clinician: Referring Emine Lopata: Treating Agron Swiney/Extender: Allene Pyo in Treatment: Brillion reviewed with physician Active Inactive Abuse / Safety / Falls / Self Care Management Nursing Diagnoses: History of Falls Potential for falls Goals: Patient will not experience any injury related to falls Date Initiated: 12/07/2021 Target Resolution Date: 02/01/2022 Goal Status: Active Patient/caregiver will verbalize/demonstrate measures taken to prevent injury and/or falls Date Initiated: 09/20/2021 Date Inactivated: 10/19/2021 Target Resolution Date: 10/18/2021 Goal Status: Met Interventions: Assess fall risk on admission and as needed Assess impairment of mobility on admission and as needed per policy Notes: Pressure Nursing Diagnoses: Knowledge deficit related to causes and risk factors for pressure ulcer development Goals: Patient will remain free of pressure ulcers Date Initiated: 12/07/2021 Target Resolution Date: 02/01/2022 Goal Status: Active Interventions: Assess potential for pressure ulcer upon admission and as needed Notes: Electronic Signature(s) Signed: 12/17/2021 4:59:49 PM By: Adline Peals Entered By: Adline Peals on 12/17/2021 11:03:21 [place], [name] ([id number]) [id number] Page 6 of  11 -------------------------------------------------------------------------------- Pain Assessment Details Patient Name: Date of Service: April, BIONDO [name]. 12/17/2021 10:30 A M Medical Record Number: [id number] Patient Account Number: [id number] Date of Birth/Sex: Treating RN: 1945/11/24 (76 y.o. Donalda Ewings Primary Care Vegas Coffin: Domingo Mend Other Clinician: Referring Bernarda Erck: Treating Rasheeda Mulvehill/Extender: Allene Pyo in Treatment: 12 Active Problems Location of Pain Severity and Description of Pain Patient Has Paino No Site Locations Rate the pain. Current Pain Level: 0 Pain Management and Medication Current Pain Management: Electronic Signature(s) Signed: 12/17/2021 4:35:13 PM By: Sharyn Creamer RN, BSN Entered By: Sharyn Creamer on 12/17/2021 10:31:30 -------------------------------------------------------------------------------- Patient/Caregiver Education Details Patient Name: Date of Service: [name]. 12/11/2023andnbsp10:30 A M Medical Record Number: [id number] Patient Account Number: [id number] Date of Birth/Gender: Treating RN: [birth year] (76 y.o. Harlow Ohms Primary Care Physician: Domingo Mend Other Clinician: Referring Physician: Treating Physician/Extender: Allene Pyo in Treatment: 12 Education Assessment Education Provided To: Patient Education Topics Provided Wound/Skin Impairment: Methods: Explain/Verbal Responses: Reinforcements needed, State content correctly April, STOECKER [name] ([id number]) [id number] Page 7 of 11 Electronic Signature(s) Signed: 12/17/2021 4:59:49 PM By: Adline Peals Entered By: Adline Peals on 12/17/2021 11:03:35 -------------------------------------------------------------------------------- Wound Assessment Details Patient Name: Date of Service: April, MABEE [name]. 12/17/2021 10:30 A M Medical Record  Number: [id number] Patient Account Number: [id number] Date of Birth/Sex: Treating RN: 04-06-1945 (76 y.o. Harlow Ohms Primary Care Kenna Kirn: Domingo Mend Other Clinician: Referring Xitlaly Ault: Treating Kathye Cipriani/Extender: Allene Pyo in Treatment: 12 Wound Status Wound Number: 2 Primary Open Surgical Wound Etiology: Wound Location: Right, Lateral Lower Leg Secondary Venous Leg Ulcer Wounding  Event: Surgical Injury Etiology: Date Acquired: 08/17/2021 Wound Status: Open Weeks Of Treatment: 12 Comorbid Cataracts, Chronic sinus problems/congestion, Hypertension, Clustered Wound: Yes History: Crohns, Osteoarthritis Photos Wound Measurements Length: (cm) 8 Width: (cm) 4.5 Depth: (cm) 0.4 Area: (cm) 28.274 Volume: (cm) 11.31 % Reduction in Area: -55.8% % Reduction in Volume: -523.5% Epithelialization: Large (67-100%) Tunneling: No Undermining: No Wound Description Classification: Full Thickness Without Exposed Suppor Exudate Amount: Large Exudate Type: Serous Exudate Color: amber t Structures Foul Odor After Cleansing: No Slough/Fibrino Yes Wound Bed Granulation Amount: Large (67-100%) Exposed Structure Granulation Quality: Red Fascia Exposed: No Necrotic Amount: Small (1-33%) Fat Layer (Subcutaneous Tissue) Exposed: Yes Necrotic Quality: Eschar Tendon Exposed: No Muscle Exposed: No Joint Exposed: No Bone Exposed: No Periwound Skin Texture Texture Color No Abnormalities Noted: Yes No Abnormalities Noted: Yes Moisture Temperature / Pain No Abnormalities Noted: Yes Temperature: No Abnormality Wickliff, Lashanti [name] ([id number]) [id number] Page 8 of 11 Treatment Notes Wound #2 (Lower Leg) Wound Laterality: Right, Lateral Cleanser Soap and Water Discharge Instruction: May shower and wash wound with dial antibacterial soap and water prior to dressing change. Wound Cleanser Discharge Instruction: Cleanse  the wound with wound cleanser prior to applying a clean dressing using gauze sponges, not tissue or cotton balls. Peri-Wound Care Sween Lotion (Moisturizing lotion) Discharge Instruction: Apply moisturizing lotion as directed Topical Primary Dressing IODOFLEX 0.9% Cadexomer Iodine Pad 4x6 cm Discharge Instruction: Apply to wound bed as instructed Secondary Dressing Zetuvit Plus 4x8 in Discharge Instruction: Apply over primary dressing as directed. Secured With Transpore Surgical Tape, 2x10 (in/yd) Discharge Instruction: Secure dressing with tape as directed. Compression Wrap ThreePress (3 layer compression wrap) Discharge Instruction: Apply three layer compression as directed. Compression Stockings Add-Ons Electronic Signature(s) Signed: 12/17/2021 4:59:49 PM By: Adline Peals Entered By: Adline Peals on 12/17/2021 11:06:13 -------------------------------------------------------------------------------- Wound Assessment Details Patient Name: Date of Service: April, WILLIG [name]. 12/17/2021 10:30 A M Medical Record Number: [id number] Patient Account Number: [id number] Date of Birth/Sex: Treating RN: 02-05-1945 (76 y.o. Donalda Ewings Primary Care Jony Ladnier: Domingo Mend Other Clinician: Referring Aayla Marrocco: Treating Tericka Devincenzi/Extender: Allene Pyo in Treatment: 12 Wound Status Wound Number: 3 Primary Pressure Ulcer Etiology: Wound Location: T Third oe Wound Open Wounding Event: Blister Status: Date Acquired: 11/23/2021 Comorbid Cataracts, Chronic sinus problems/congestion, Hypertension, Weeks Of Treatment: 1 History: Crohns, Osteoarthritis Clustered Wound: No Photos April, TOPPINS [name] ([id number]) [id number] Page 9 of 11 Wound Measurements Length: (cm) 0.5 Width: (cm) 0.4 Depth: (cm) 0.1 Area: (cm) 0.157 Volume: (cm) 0.016 % Reduction in Area: 16.5% % Reduction in Volume: 15.8% Tunneling:  No Undermining: No Wound Description Classification: Category/Stage II Wound Margin: Flat and Intact Exudate Amount: Medium Exudate Type: Serous Exudate Color: amber Foul Odor After Cleansing: No Slough/Fibrino Yes Wound Bed Granulation Amount: Medium (34-66%) Necrotic Amount: Medium (34-66%) Necrotic Quality: Adherent Slough Periwound Skin Texture Texture Color No Abnormalities Noted: No No Abnormalities Noted: Yes Moisture No Abnormalities Noted: No Dry / Scaly: No Maceration: Yes Treatment Notes Wound #3 (Toe Third) Cleanser Soap and Water Discharge Instruction: May shower and wash wound with dial antibacterial soap and water prior to dressing change. Wound Cleanser Discharge Instruction: Cleanse the wound with wound cleanser prior to applying a clean dressing using gauze sponges, not tissue or cotton balls. Peri-Wound Care Sween Lotion (Moisturizing lotion) Discharge Instruction: Apply moisturizing lotion as directed Topical Gentamicin Discharge Instruction: As directed by physician Primary Dressing Sorbalgon AG Dressing, 4x4 (in/in) Discharge Instruction: Apply to wound bed as instructed Secondary  Dressing Zetuvit Plus Silicone Border Dressing 5x5 (in/in) Discharge Instruction: Apply silicone border over primary dressing as directed. Secured With Compression Wrap Compression Stockings Add-Ons April, BOURNE [name] ([id number]) [id number] Page 10 of 11 Electronic Signature(s) Signed: 12/17/2021 4:35:13 PM By: Sharyn Creamer RN, BSN Entered By: Sharyn Creamer on 12/17/2021 10:35:38 -------------------------------------------------------------------------------- Wound Assessment Details Patient Name: Date of Service: [name]. 12/17/2021 10:30 A M Medical Record Number: [id number] Patient Account Number: [id number] Date of Birth/Sex: Treating RN: 1945/12/11 (76 y.o. Donalda Ewings Primary Care Rea Reser: Domingo Mend Other  Clinician: Referring Deana Krock: Treating Perlie Scheuring/Extender: Allene Pyo in Treatment: 12 Wound Status Wound Number: 4 Primary Lesion Etiology: Wound Location: Left Knee Wound Open Wounding Event: Other Lesion Status: Date Acquired: 11/23/2021 Comorbid Cataracts, Chronic sinus problems/congestion, Hypertension, Weeks Of Treatment: 1 History: Crohns, Osteoarthritis Clustered Wound: No Photos Wound Measurements Length: (cm) 0.6 Width: (cm) 0.8 Depth: (cm) 0.3 Area: (cm) 0.377 Volume: (cm) 0.113 % Reduction in Area: -199.2% % Reduction in Volume: -769.2% Tunneling: No Undermining: No Wound Description Classification: Full Thickness Without Exposed Support Exudate Amount: Medium Exudate Type: Serous Exudate Color: amber Structures Foul Odor After Cleansing: No Slough/Fibrino Yes Wound Bed Granulation Amount: None Present (0%) Necrotic Amount: Large (67-100%) Necrotic Quality: Eschar, Adherent Slough Periwound Skin Texture Texture Color No Abnormalities Noted: Yes No Abnormalities Noted: Yes Moisture No Abnormalities Noted: No Dry / Scaly: Yes Maceration: No Treatment Notes Wound #4 (Knee) Wound Laterality: Left YOVANNA, COGAN [name] ([id number]) [id number] Page 11 of 11 Cleanser Soap and Water Discharge Instruction: May shower and wash wound with dial antibacterial soap and water prior to dressing change. Wound Cleanser Discharge Instruction: Cleanse the wound with wound cleanser prior to applying a clean dressing using gauze sponges, not tissue or cotton balls. Peri-Wound Care Sween Lotion (Moisturizing lotion) Discharge Instruction: Apply moisturizing lotion as directed Topical Primary Dressing Sorbalgon AG Dressing, 4x4 (in/in) Discharge Instruction: Apply to wound bed as instructed Secondary Dressing Zetuvit Plus Silicone Border Dressing 5x5 (in/in) Discharge Instruction: Apply silicone border over  primary dressing as directed. Secured With Compression Wrap Compression Stockings Environmental education officer) Signed: 12/17/2021 4:35:13 PM By: Sharyn Creamer RN, BSN Entered By: Sharyn Creamer on 12/17/2021 10:36:27 -------------------------------------------------------------------------------- Vitals Details Patient Name: Date of Service: [name]. 12/17/2021 10:30 A M Medical Record Number: [id number] Patient Account Number: [id number] Date of Birth/Sex: Treating RN: [birth year] (76 y.o. Donalda Ewings Primary Care Shamela Haydon: Domingo Mend Other Clinician: Referring Hetty Linhart: Treating Lubna Stegeman/Extender: Allene Pyo in Treatment: 12 Vital Signs Time Taken: 10:30 Temperature (F): 97.5 Height (in): 60 Pulse (bpm): 65 Weight (lbs): 101 Respiratory Rate (breaths/min): 18 Body Mass Index (BMI): 19.7 Blood Pressure (mmHg): 195/77 Reference Range: 80 - 120 mg / dl Electronic Signature(s) Signed: 12/17/2021 4:35:13 PM By: Sharyn Creamer RN, BSN Entered By: Sharyn Creamer on 12/17/2021 10:31:12

## 2021-12-18 NOTE — Progress Notes (Signed)
[name], [name] ([id number]) [id number] Page 1 of 9 Visit Report for 12/17/2021 Debridement Details Patient Name: Date of Service: [name], [name]. 12/17/2021 10:30 A M Medical Record Number: [id number] Patient Account Number: [id number] Date of Birth/Sex: Treating [name]: 1945/11/22 (76 y.o. F) Primary Care Provider: Domingo [name] Other Clinician: Referring Provider: Treating Provider/Extender: [name] in Treatment: 12 Debridement Performed for Assessment: Wound #2 Right,Lateral Lower Leg Performed By: Physician [name]., [name] Debridement Type: Debridement Severity of Tissue Pre Debridement: Fat layer exposed Level of Consciousness (Pre-procedure): Awake and Alert Pre-procedure Verification/Time Out Yes - 10:58 Taken: Start Time: 10:58 Pain Control: Lidocaine 5% topical ointment T Area Debrided (L x W): otal 8 (cm) x 2.5 (cm) = 20 (cm) Tissue and other material debrided: Non-Viable, Subcutaneous Level: Skin/Subcutaneous Tissue Debridement Description: Excisional Instrument: Curette Bleeding: Minimum Hemostasis Achieved: Pressure Response to Treatment: Procedure was tolerated well Level of Consciousness (Post- Awake and Alert procedure): Post Debridement Measurements of Total Wound Length: (cm) 8 Width: (cm) 4.5 Depth: (cm) 0.1 Volume: (cm) 2.827 Character of Wound/Ulcer Post Debridement: Improved Severity of Tissue Post Debridement: Fat layer exposed Post Procedure Diagnosis Same as Pre-procedure Notes scribed for Dr. Dellia Nims by [name], [name] Electronic Signature(s) Signed: 12/17/2021 5:01:06 PM By: [name] Entered By: April Ham on 12/17/2021 11:09:52 -------------------------------------------------------------------------------- HPI Details Patient Name: Date of Service: [name]. 12/17/2021 10:30 A M Medical Record Number: [id number] Patient Account Number:  [id number] Date of Birth/Sex: Treating [name]: Jul 31, 1945 (76 y.o. F) Primary Care Provider: Domingo [name] Other Clinician: Referring Provider: Treating Provider/Extender: [name] in Treatment: 12 History of Present Illness April, [name] ([id number]) [id number] Page 2 of 9 HPI Description: ADMISSION 11/15/2020 This is a 76 year old woman with a history of squamous cell cancer skin cancer followed by Dr. Anabel Bene dermatology. She describes having a wound on the right posterior calf dating back since September. She saw her primary doctor in early October who suggested petroleum infused gauze. The patient has been doing this. No real improvement in fact she says this is getting larger. She does not complain much of pain. She is completely uncertain about how this happened there was no trauma she simply became aware of it at some point describing it is about the size of a quarter but it has been getting progressively larger since then. She is here for review of this. Past medical history Crohn's disease not currently on prednisone, gastroesophageal reflux disease, history of squamous cell skin cancer, history of rheumatoid arthritis, hypertension 11/16; this lady had a large superficial atypical wound on the right posterior calf. She has significant solar skin damage probably some degree of venous insufficiency but she described a rapidly expanding wound area. Because of this and a history of skin cancer [name] went ahead and did a shave biopsy but we do not have the results of this at the time we saw her in clinic today. She has been using silver alginate and kerlix 11/30; shave biopsy [name] did last time did not show any malignancy or fungal elements. Suggestion of a stasis ulcer. With our treatment which is simply been silver alginate with kerlix that she is changing daily the wound is down dramatically almost surprising how rapidly it is  reduced in surface area 12/14; patient comes in today with her wound healed this was on the right posterior calf. She has significant solar skin damage probably some degree of venous insufficiency  however she described a rapidly expanding wound area on admission. Because of this and a history of skin cancer [name] did do a shave biopsy but nothing was really found of worry. 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 Unfortunately, the wound has failed to heal. There is thick slough on the wound surface. The periwound skin is in reasonably good condition. There is granulation tissue forming underneath the slough. 09/28/2021: The wound has deteriorated rather significantly over the past week. She had fairly copious drainage that she noticed as early as Monday, but she did not contact our office. Today there is a lot of periwound skin breakdown and a very pungent odor coming from the wound. There is thick slough on the wound surface but there is still good granulation tissue underlying this. Despite this, however, the wound actually measures smaller. 10/05/2021: The culture that [name] took grew out MRSA. [name] prescribed doxycycline and she is still taking this. There has been a tremendous improvement in the wound. It is smaller and has contracted considerably. There is hypertrophic granulation tissue present. No malodorous drainage. 10/16/2021: The wound has contracted further and is epithelializing. The surface is very clean and the hypertrophic granulation tissue has not recurred. She completed her course of doxycycline. 10/19; surgical wound on the right medial lower extremity secondary to Mohs surgery done in August. She is using Hydrofera Blue under 3 layer  compression and making excellent progress. 11/02/2021: The wound is nearly completely epithelialized. Just a couple of open areas with some eschar overlying the surface. 11/12/2021: Just 1 small open area remains. It is clean without any slough or eschar accumulation. No hypertrophic granulation tissue present. 11/19/2021: Her wound is healed. READMISSION 12/07/2021 The patient has been moving for the past couple of weeks and has spent a substantial amount of time on her feet. As result, she developed leg swelling and the tissue where her wound had been, which she was protecting with a Band-Aid, broke down considerably. She now has a large wound that is irregular with extensive slough, involving the fat layer. She also developed a blister on her toe that has slough and eschar present. She also pointed out lump just above her left knee that she is concerned about, as she does have a history of multiple skin cancers. 12/11; patient comes in the clinic with a large wound on the right anterior lower leg. This was initially a skin cancer underwent Mohs surgery and was treated in this clinic and healed out on 11/13. The patient states the wound reopened with edema and also trauma from a dressing she was putting on it. 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 She also has a wound on the right second toe. The patient states quite adamantly she will not go back to her previous dermatologist for the skin surgery center in [place] blaming them for the original wound on her right lateral leg. [name] am not exactly sure of her rationale but at this point she certainly will not consider a referral there Electronic Signature(s) Signed: 12/17/2021 5:01:06 PM By: [name] Entered By: April Ham on 12/17/2021 11:13:36 -------------------------------------------------------------------------------- Physical Exam Details Patient Name: Date of Service: [name]. 12/17/2021 10:30 A M  Medical Record Number: [id number] Patient Account Number: [id number] Date of Birth/Sex: Treating [name]: 17-Sep-1945 (76 y.o. F) Primary Care Provider: Domingo [name] Other Clinician: Referring Provider: Treating Provider/Extender: [name] in Treatment: [address] [place] [name] ([id number]) [id number] Page 3 of 9 Constitutional Patient is hypertensive.. Pulse regular and within target range for patient.Marland Kitchen Respirations regular, non-labored and within target range.. Temperature is normal and within the target range for the patient.Marland Kitchen Appears in no distress. Notes Wound exam; Right lateral lower leg is her major wound this had completely nonviable necrotic tissue over the surface extensive debridement with a #5 curette to get down to a 70 viable surface although he had has 2 mm of depth. The biopsy site on the left thigh just above her patella looks stable but according to the patient is a rapidly expanding area Right second toe looks satisfactory The patient probably has some degree of solar induced skin damage and chronic venous insufficiency in both lower legs.. She does not have an obvious arterial issue Electronic Signature(s) Signed: 12/17/2021 5:01:06 PM By: [name] Entered By: April Ham on 12/17/2021 11:15:19 -------------------------------------------------------------------------------- Physician Orders Details Patient Name: Date of Service: [name]. 12/17/2021 10:30 A M Medical Record Number: [id number] Patient Account Number: [id number] Date of Birth/Sex: Treating [name]: 1945-02-27 (76 y.o. Harlow Ohms Primary Care Provider: Domingo [name] Other Clinician: Referring Provider: Treating Provider/Extender: [name] in Treatment: 12 Verbal / Phone Orders: No Diagnosis Coding Follow-up Appointments ppointment in 1 week. - Dr.  Celine [name] Rm 1 Return A Anesthetic Wound #4 Left Knee (In clinic) Topical Lidocaine 5% applied to wound bed - in clinic, prior to debridement Bathing/ Shower/ Hygiene May shower with protection but do not get wound dressing(s) wet. - cover dressing on Right leg to prevent dressing from getting wet. Keep dressing dry at all times. Edema Control - Lymphedema / SCD / Other Elevate legs to the level of the heart or above for 30 minutes daily and/or when sitting, a frequency of: Avoid standing for long periods of time. Moisturize legs daily. Wound Treatment Wound #2 - Lower Leg Wound Laterality: Right, Lateral Cleanser: Soap and Water Discharge Instructions: May shower and wash wound with dial antibacterial soap and water prior to dressing change. Cleanser: Wound Cleanser Discharge Instructions: Cleanse the wound with wound cleanser prior to applying a clean dressing using gauze sponges, not tissue or cotton balls. Peri-Wound Care: Sween Lotion (Moisturizing lotion) Discharge Instructions: Apply moisturizing lotion as directed Topical: Mupirocin Ointment Discharge Instructions: Apply Mupirocin (Bactroban) as instructed Prim Dressing: IODOFLEX 0.9% Cadexomer Iodine Pad 4x6 cm ary Discharge Instructions: Apply to wound bed as instructed Secondary Dressing: Zetuvit Plus 4x8 in Discharge Instructions: Apply over primary dressing as directed. Secured With: Transpore Surgical Tape, 2x10 (in/yd) Discharge Instructions: Secure dressing with tape as directed. April, GARMANY [name] ([id number]) [id number] Page 4 of 9 Compression Wrap: ThreePress (3 layer compression wrap) Discharge Instructions: Apply three layer compression as directed. Wound #3 - T Third oe Cleanser: Soap and Water Discharge Instructions: May shower and wash wound with dial antibacterial soap and water prior to dressing change. Cleanser: Wound Cleanser Discharge Instructions: Cleanse the wound with wound  cleanser prior to applying a clean dressing using gauze sponges, not tissue or cotton balls. Peri-Wound Care: Sween Lotion (Moisturizing lotion) Discharge Instructions: Apply moisturizing lotion as directed Prim Dressing: Sorbalgon AG Dressing, 4x4 (in/in) ary Discharge Instructions: Apply to wound bed as instructed Secured With: Conforming  Stretch Gauze Bandage, Sterile 2x75 (in/in) Discharge Instructions: Secure with stretch gauze as directed. Secured With: 15M Medipore Public affairs consultant Surgical T 2x10 (in/yd) ape Discharge Instructions: Secure with tape as directed. Wound #4 - Knee Wound Laterality: Left Cleanser: Soap and Water Discharge Instructions: May shower and wash wound with dial antibacterial soap and water prior to dressing change. Cleanser: Wound Cleanser Discharge Instructions: Cleanse the wound with wound cleanser prior to applying a clean dressing using gauze sponges, not tissue or cotton balls. Peri-Wound Care: Sween Lotion (Moisturizing lotion) Discharge Instructions: Apply moisturizing lotion as directed Prim Dressing: Sorbalgon AG Dressing, 4x4 (in/in) ary Discharge Instructions: Apply to wound bed as instructed Secondary Dressing: Zetuvit Plus Silicone Border Dressing 5x5 (in/in) Discharge Instructions: Apply silicone border over primary dressing as directed. [place] - positive punch biopsy- well differentiated squamous cell carcinoma - (ICD10 L98.9 - Disorder of the skin and subcutaneous tissue, unspecified) Patient Medications llergies: Demerol, latex, aspirin, ciprofloxacin, codeine, Septra, lactose, adhesive tape, fentanyl A Notifications Medication Indication Start End 12/18/2021 lidocaine DOSE topical 5 % ointment - ointment topical Electronic Signature(s) Signed: 12/17/2021 4:59:49 PM By: [name] Signed: 12/17/2021 5:01:06 PM By: [name] Entered By: [name] on 12/17/2021  11:32:39 Prescription 12/17/2021 -------------------------------------------------------------------------------- [name] Patient Name: Provider: 1945/02/11 [id number] Date of Birth: NPI#: [id number] Sex: DEA #: [phone] Phone #: License #: April, PETTERSON [name] ([id number]) [id number] Page 5 of [place] Patient Address: [place], [place] [address] [address], [place] [address] [phone] Allergies Demerol; latex; aspirin; ciprofloxacin; codeine; Septra; lactose; adhesive tape; fentanyl Provider's [place] - positive punch biopsy- well differentiated squamous cell carcinoma - (ICD10 L98.9 - Disorder of the skin and subcutaneous tissue, unspecified) Hand Signature: Date(s): Electronic Signature(s) Signed: 12/17/2021 4:59:49 PM By: [name] Signed: 12/17/2021 5:01:06 PM By: [name] Entered By: [name] on 12/17/2021 11:32:40 -------------------------------------------------------------------------------- Problem List Details Patient Name: Date of Service: [name]. 12/17/2021 10:30 A M Medical Record Number: [id number] Patient Account Number: [id number] Date of Birth/Sex: Treating [name]: April 11, 1945 (76 y.o. F) Primary Care Provider: Domingo [name] Other Clinician: Referring Provider: Treating Provider/Extender: [name] in Treatment: 12 Active Problems ICD-10 Encounter Code Description Active Date MDM Diagnosis L97.812 Non-pressure chronic ulcer of other part of right lower leg with fat layer 09/20/2021 No Yes exposed L97.512 Non-pressure chronic ulcer of other part of right foot with fat layer exposed 12/07/2021 No Yes L98.9 Disorder of the skin and subcutaneous tissue, unspecified 12/07/2021 No Yes I10 Essential (primary) hypertension  09/20/2021 No Yes K50.90 Crohn's disease, unspecified, without complications 6/43/3295 No Yes E43 Unspecified severe protein-calorie malnutrition 09/20/2021 No Yes Inactive Problems ICD-10 Code Description Active Date Inactive Date ANIEYA, HELMAN ([id number]) [id number] Page 6 of 9 T81.31XS Disruption of external operation (surgical) wound, not elsewhere classified, sequela 09/20/2021 09/20/2021 C44.722 Squamous cell carcinoma of skin of right lower limb, including hip 09/20/2021 09/20/2021 Resolved Problems Electronic Signature(s) Signed: 12/17/2021 5:01:06 PM By: [name] Entered By: April Ham on 12/17/2021 11:09:27 -------------------------------------------------------------------------------- Progress Note Details Patient Name: Date of Service: [name]. 12/17/2021 10:30 A M Medical Record Number: [id number] Patient Account Number: [id number] Date of Birth/Sex: Treating [name]: [birth year] (76 y.o. F) Primary Care Provider: Domingo [name] Other Clinician: Referring Provider: Treating Provider/Extender: [name] , Olam Idler  Weeks in Treatment: 12 Subjective History of Present Illness (HPI) ADMISSION 11/15/2020 This is a 76 year old woman with a history of squamous cell cancer skin cancer followed by Dr. Anabel Bene dermatology. She describes having a wound on the right posterior calf dating back since September. She saw her primary doctor in early October who suggested petroleum infused gauze. The patient has been doing this. No real improvement in fact she says this is getting larger. She does not complain much of pain. She is completely uncertain about how this happened there was no trauma she simply became aware of it at some point describing it is about the size of a quarter but it has been getting progressively larger since then. She is here for review of this. Past medical history Crohn's disease not currently on  prednisone, gastroesophageal reflux disease, history of squamous cell skin cancer, history of rheumatoid arthritis, hypertension 11/16; this lady had a large superficial atypical wound on the right posterior calf. She has significant solar skin damage probably some degree of venous insufficiency but she described a rapidly expanding wound area. Because of this and a history of skin cancer [name] went ahead and did a shave biopsy but we do not have the results of this at the time we saw her in clinic today. She has been using silver alginate and kerlix 11/30; shave biopsy [name] did last time did not show any malignancy or fungal elements. Suggestion of a stasis ulcer. With our treatment which is simply been silver alginate with kerlix that she is changing daily the wound is down dramatically almost surprising how rapidly it is reduced in surface area 12/14; patient comes in today with her wound healed this was on the right posterior calf. She has significant solar skin damage probably some degree of venous insufficiency however she described a rapidly expanding wound area on admission. Because of this and a history of skin cancer [name] did do a shave biopsy but nothing was really found of worry. 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 Unfortunately, the wound has failed to heal. There is thick slough on the wound surface. The periwound skin is in reasonably good condition. There is granulation tissue forming underneath the slough. 09/28/2021: The wound has deteriorated rather significantly over the past week. She had fairly copious drainage that she noticed as early as Monday, but she did not contact our office. Today there is a lot of periwound skin breakdown  and a very pungent odor coming from the wound. There is thick slough on the wound surface but there is still good granulation tissue underlying this. Despite this, however, the wound actually measures smaller. 10/05/2021: The culture that [name] took grew out MRSA. [name] prescribed doxycycline and she is still taking this. There has been a tremendous improvement in the wound. It is smaller and has contracted considerably. There is hypertrophic granulation tissue present. No malodorous drainage. 10/16/2021: The wound has contracted further and is epithelializing. The surface is very clean and the hypertrophic granulation tissue has not recurred. She completed her course of doxycycline. 10/19; surgical wound on the right medial lower extremity secondary to Mohs surgery done in August. She is using Hydrofera Blue under 3 layer compression and making excellent progress. 11/02/2021: The wound is nearly completely  epithelialized. Just a couple of open areas with some eschar overlying the surface. 11/12/2021: Just 1 small open area remains. It is clean without any slough or eschar accumulation. No hypertrophic granulation tissue present. 11/19/2021: Her wound is healed. READMISSION 12/07/2021 The patient has been moving for the past couple of weeks and has spent a substantial amount of time on her feet. As result, she developed leg swelling and April, VANDERBECK [name] ([id number]) [id number] Page 7 of 9 the tissue where her wound had been, which she was protecting with a Band-Aid, broke down considerably. She now has a large wound that is irregular with extensive slough, involving the fat layer. She also developed a blister on her toe that has slough and eschar present. She also pointed out lump just above her left knee that she is concerned about, as she does have a history of multiple skin cancers. 12/11; patient comes in the clinic with a large wound on the right anterior lower leg. This was  initially a skin cancer underwent Mohs surgery and was treated in this clinic and healed out on 11/13. The patient states the wound reopened with edema and also trauma from a dressing she was putting on it. 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 She also has a wound on the right second toe. The patient states quite adamantly she will not go back to her previous dermatologist for the skin surgery center in [place] blaming them for the original wound on her right lateral leg. [name] am not exactly sure of her rationale but at this point she certainly will not consider a referral there Objective Constitutional Patient is hypertensive.. Pulse regular and within target range for patient.Marland Kitchen Respirations regular, non-labored and within target range.. Temperature is normal and within the target range for the patient.Marland Kitchen Appears in no distress. Vitals Time Taken: 10:30 AM, Height: 60 in, Weight: 101 lbs, BMI: 19.7, Temperature: 97.5 F, Pulse: 65 bpm, Respiratory Rate: 18 breaths/min, Blood Pressure: 195/77 mmHg. General Notes: Wound exam; oo Right lateral lower leg is her major wound this had completely nonviable necrotic tissue over the surface extensive debridement with a #5 curette to get down to a 70 viable surface although he had has 2 mm of depth. oo The biopsy site on the left thigh just above her patella looks stable but according to the patient is a rapidly expanding area oo Right second toe looks satisfactory The patient probably has some degree of solar induced skin damage and chronic venous insufficiency in both lower legs.. She does not have an obvious arterial issue Integumentary (Hair, Skin) Wound #2 status is Open. Original cause of wound was Surgical Injury. The date acquired was: 08/17/2021. The wound has been in treatment 12 weeks. The wound is located on the Right,Lateral Lower Leg. The wound measures 8cm length x 4.5cm width x 0.4cm depth;  28.274cm^2 area and 11.31cm^3 volume. There is Fat Layer (Subcutaneous Tissue) exposed. There is no tunneling or undermining noted. There is a large amount of serous drainage noted. There is large (67-100%) red granulation within the wound bed. There is a small (1-33%) amount of necrotic tissue within the wound bed including Eschar. The periwound skin appearance had no abnormalities noted for texture. The periwound skin appearance had no abnormalities noted for moisture. The periwound skin appearance had no abnormalities noted for color. Periwound temperature was noted as No Abnormality. Wound #3 status is Open. Original cause of wound was Blister. The date acquired was: 11/23/2021.  The wound has been in treatment 1 weeks. The wound is located on the T Third. The wound measures 0.5cm length x 0.4cm width x 0.1cm depth; 0.157cm^2 area and 0.016cm^3 volume. There is no tunneling or oe undermining noted. There is a medium amount of serous drainage noted. The wound margin is flat and intact. There is medium (34-66%) granulation within the wound bed. There is a medium (34-66%) amount of necrotic tissue within the wound bed including Adherent Slough. The periwound skin appearance had no abnormalities noted for color. The periwound skin appearance exhibited: Maceration. The periwound skin appearance did not exhibit: Dry/Scaly. Wound #4 status is Open. Original cause of wound was Other Lesion. The date acquired was: 11/23/2021. The wound has been in treatment 1 weeks. The wound is located on the Left Knee. The wound measures 0.6cm length x 0.8cm width x 0.3cm depth; 0.377cm^2 area and 0.113cm^3 volume. There is no tunneling or undermining noted. There is a medium amount of serous drainage noted. There is no granulation within the wound bed. There is a large (67-100%) amount of necrotic tissue within the wound bed including Eschar and Adherent Slough. The periwound skin appearance had no abnormalities noted for  texture. The periwound skin appearance had no abnormalities noted for color. The periwound skin appearance exhibited: Dry/Scaly. The periwound skin appearance did not exhibit: Maceration. 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 There was a Excisional Skin/Subcutaneous Tissue Debridement with a total area of 20 sq cm performed by [name]., [name]. With the following instrument(s): Curette to remove Non-Viable tissue/material. Material removed includes Subcutaneous Tissue after achieving pain control using Lidocaine 5% topical ointment. No specimens were taken. A time out was conducted at 10:58, prior to the start of the procedure. A Minimum amount of bleeding was controlled with Pressure. The procedure was tolerated well. Post Debridement Measurements: 8cm length x 4.5cm width x 0.1cm depth; 2.827cm^3 volume. April, FRISON [name] ([id number]) [id number] Page 8 of 9 Character of Wound/Ulcer Post Debridement is improved. Severity of Tissue Post Debridement is: Fat layer exposed. Post procedure Diagnosis Wound #2: Same as Pre-Procedure General Notes: scribed for Dr. Dellia Nims by [name], [name]. Plan Follow-up Appointments: Return Appointment in 1 week. - Dr. Celine [name] Rm 1 Anesthetic: Wound #4 Left Knee: (In clinic) Topical Lidocaine 5% applied to wound bed - in clinic, prior to debridement Bathing/ Shower/ Hygiene: May shower with protection  but do not get wound dressing(s) wet. - cover dressing on Right leg to prevent dressing from getting wet. Keep dressing dry at all times. Edema Control - Lymphedema / SCD / Other: Elevate legs to the level of the heart or above for 30 minutes daily and/or when sitting, a frequency of: Avoid standing for long periods of time. Moisturize legs daily. The following medication(s) was prescribed: lidocaine topical 5 % ointment ointment topical was prescribed at facility WOUND #2: - Lower Leg Wound Laterality: Right, Lateral Cleanser: Soap and Water Discharge Instructions: May shower and wash wound with dial antibacterial soap and water prior to dressing change. Cleanser: Wound Cleanser Discharge Instructions: Cleanse the wound  with wound cleanser prior to applying a clean dressing using gauze sponges, not tissue or cotton balls. Peri-Wound Care: Sween Lotion (Moisturizing lotion) Discharge Instructions: Apply moisturizing lotion as directed Prim Dressing: IODOFLEX 0.9% Cadexomer Iodine Pad 4x6 cm ary Discharge Instructions: Apply to wound bed as instructed Secondary Dressing: Zetuvit Plus 4x8 in Discharge Instructions: Apply over primary dressing as directed. Secured With: Transpore Surgical T ape, 2x10 (in/yd) Discharge Instructions: Secure dressing with tape as directed. Com pression Wrap: ThreePress (3 layer compression wrap) Discharge Instructions: Apply three layer compression as directed. WOUND #3: - T Third Wound Laterality: oe Cleanser: Soap and Water Discharge Instructions: May shower and wash wound with dial antibacterial soap and water prior to dressing change. Cleanser: Wound Cleanser Discharge Instructions: Cleanse the wound with wound cleanser prior to applying a clean dressing using gauze sponges, not tissue or cotton balls. Peri-Wound Care: Sween Lotion (Moisturizing lotion) Discharge Instructions: Apply moisturizing lotion as directed Topical: Mupirocin Ointment Discharge  Instructions: Apply Mupirocin (Bactroban) as instructed Prim Dressing: Sorbalgon AG Dressing, 4x4 (in/in) ary Discharge Instructions: Apply to wound bed as instructed Secured With: Conforming Stretch Gauze Bandage, Sterile 2x75 (in/in) Discharge Instructions: Secure with stretch gauze as directed. Secured With: 63M Medipore Public affairs consultant Surgical T 2x10 (in/yd) ape Discharge Instructions: Secure with tape as directed. WOUND #4: - Knee Wound Laterality: Left Cleanser: Soap and Water Discharge Instructions: May shower and wash wound with dial antibacterial soap and water prior to dressing change. Cleanser: Wound Cleanser Discharge Instructions: Cleanse the wound with wound cleanser prior to applying a clean dressing using gauze sponges, not tissue or cotton balls. Peri-Wound Care: Sween Lotion (Moisturizing lotion) Discharge Instructions: Apply moisturizing lotion as directed Prim Dressing: Sorbalgon AG Dressing, 4x4 (in/in) ary Discharge Instructions: Apply to wound bed as instructed Secondary Dressing: Zetuvit Plus Silicone Border Dressing 5x5 (in/in) Discharge Instructions: Apply silicone border over primary dressing as directed. 1. Extensive debridement on the right lateral lower leg completely necrotic wound surface. Hemostasis with direct pressure. 2. [name] am going to change the dressing to this area to Iodoflex. We will use topical Bactroban or apparently culture of this area showed MRSA. [name] did not specifically review this today 3. [name] would wonder about a repeat biopsy of the right lateral leg to exclude recurrence of her previous cancer. [name] did not do this today 4. The area just above her left patella is a known squamous cell carcinoma biopsied last week. Situation is complicated by her refusal to go to back to her dermatologist or the [facility] here. [name] am going to look into [facility] for her 5. T the right second toe silver alginate this looks stable o Electronic  Signature(s) Signed: 12/17/2021 5:01:06 PM By: [name] Entered By: April Ham on 12/17/2021 11:17:54 Burdine, Parks Ranger ([id number]) [id number] Page 9 of 9 -------------------------------------------------------------------------------- SuperBill Details Patient Name: Date of Service: JOELINE, FREER 12/17/2021 Medical Record Number: [id number] Patient Account Number: [id number] Date of Birth/Sex: Treating [name]: [birth year] (76 y.o. F) Primary Care Provider: Domingo [name] Other Clinician: Referring Provider: Treating Provider/Extender: [name] in Treatment: 12 Diagnosis Coding ICD-10 Codes Code Description [phone] 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d L97.512 Non-pressure chronic ulcer of other part of right foot with fat layer exposed L98.9 Disorder of the skin and subcutaneous tissue, unspecified I10 Essential (primary) hypertension K50.90 Crohn's disease, unspecified, without complications V78 Unspecified severe protein-calorie malnutrition Facility Procedures : CPT4  Code: 61254832 Description: 34688 - DEB SUBQ TISSUE 20 SQ CM/< ICD-10 Diagnosis Description T37.308 Non-pressure chronic ulcer of other part of right lower leg with fat layer exp Modifier: osed Quantity: 1 Physician Procedures : CPT4 Code Description Modifier [id number] 11042 - WC PHYS SUBQ TISS 20 SQ CM ICD-10 Diagnosis Description U58.260 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d Quantity: 1 Electronic Signature(s) Signed: 12/17/2021 5:01:06 PM By: [name] Entered By: April Ham on 12/17/2021 11:18:05

## 2021-12-24 ENCOUNTER — Encounter (HOSPITAL_BASED_OUTPATIENT_CLINIC_OR_DEPARTMENT_OTHER): Payer: Medicare Other | Admitting: Internal Medicine

## 2021-12-24 DIAGNOSIS — L97812 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d: Secondary | ICD-10-CM | POA: Diagnosis not present

## 2021-12-25 NOTE — Progress Notes (Signed)
MAKENSEY, REGO [name] ([id number]) [id number] Page 1 of 8 Visit Report for 12/24/2021 Debridement Details Patient Name: Date of Service: [name], [name]. 12/24/2021 10:30 A M Medical Record Number: [id number] Patient Account Number: [id number] Date of Birth/Sex: Treating RN: 06/21/1945 (76 y.o. Iver Nestle, Jamie Primary Care Provider: Domingo Mend Other Clinician: Referring Provider: Treating Provider/Extender: Allene Pyo in Treatment: 13 Debridement Performed for Assessment: Wound #2 Right,Lateral Lower Leg Performed By: Physician Ricard Dillon., MD Debridement Type: Debridement Severity of Tissue Pre Debridement: Fat layer exposed Level of Consciousness (Pre-procedure): Awake and Alert Pre-procedure Verification/Time Out Yes - 10:50 Taken: Start Time: 10:51 Pain Control: Lidocaine 5% topical ointment T Area Debrided (L x W): otal 4.7 (cm) x 2.8 (cm) = 13.16 (cm) Tissue and other material debrided: Viable, Slough, Subcutaneous, Slough Level: Skin/Subcutaneous Tissue Debridement Description: Excisional Instrument: Curette Bleeding: Minimum Hemostasis Achieved: Pressure Procedural Pain: 0 Post Procedural Pain: 0 Response to Treatment: Procedure was tolerated well Level of Consciousness (Post- Awake and Alert procedure): Post Debridement Measurements of Total Wound Length: (cm) 4.7 Width: (cm) 2.8 Depth: (cm) 0.2 Volume: (cm) 2.067 Character of Wound/Ulcer Post Debridement: Requires Further Debridement Severity of Tissue Post Debridement: Fat layer exposed Post Procedure Diagnosis Same as Pre-procedure Notes Scribed for Dr. Dellia Nims by Blanche East, RN Electronic Signature(s) Signed: 12/24/2021 5:06:55 PM By: [name] MD Signed: 12/24/2021 5:09:12 PM By: Blanche East RN Entered By: Blanche East on 12/24/2021  10:52:26 -------------------------------------------------------------------------------- HPI Details Patient Name: Date of Service: [name] [name]. 12/24/2021 10:30 A M Medical Record Number: [id number] Patient Account Number: [id number] Date of Birth/Sex: Treating RN: 12-05-1945 (76 y.o. F) Primary Care Provider: Domingo Mend Other Clinician: Referring Provider: Treating Provider/Extender: Allene Pyo in Treatment: [address], [place] [name] ([id number]) [id number] Page 2 of 8 History of Present Illness HPI Description: ADMISSION 11/15/2020 This is a 76 year old woman with a history of squamous cell cancer skin cancer followed by Dr. Anabel Bene dermatology. She describes having a wound on the right posterior calf dating back since September. She saw her primary doctor in early October who suggested petroleum infused gauze. The patient has been doing this. No real improvement in fact she says this is getting larger. She does not complain much of pain. She is completely uncertain about how this happened there was no trauma she simply became aware of it at some point describing it is about the size of a quarter but it has been getting progressively larger since then. She is here for review of this. Past medical history Crohn's disease not currently on prednisone, gastroesophageal reflux disease, history of squamous cell skin cancer, history of rheumatoid arthritis, hypertension 11/16; this lady had a large superficial atypical wound on the right posterior calf. She has significant solar skin damage probably some degree of venous insufficiency but she described a rapidly expanding wound area. Because of this and a history of skin cancer [name] went ahead and did a shave biopsy but we do not have the results of this at the time we saw her in clinic today. She has been using silver alginate and kerlix 11/30; shave biopsy [name] did last time did not  show any malignancy or fungal elements. Suggestion of a stasis ulcer. With our treatment which is simply been silver alginate with kerlix that she is changing daily the wound is down dramatically almost surprising how rapidly it is reduced in surface area 12/14; patient comes in today with her  wound healed this was on the right posterior calf. She has significant solar skin damage probably some degree of venous insufficiency however she described a rapidly expanding wound area on admission. Because of this and a history of skin cancer [name] did do a shave biopsy but nothing was really found of worry. 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 Unfortunately, the wound has failed to heal. There is thick slough on the wound surface. The periwound skin is in reasonably good condition. There is granulation tissue forming underneath the slough. 09/28/2021: The wound has deteriorated rather significantly over the past week. She had fairly copious drainage that she noticed as early as Monday, but she did not contact our office. Today there is a lot of periwound skin breakdown and a very pungent odor coming from the wound. There is thick slough on the wound surface but there is still good granulation tissue underlying this. Despite this, however, the wound actually measures smaller. 10/05/2021: The culture that [name] took grew out MRSA. [name] prescribed doxycycline and she is still taking this. There has been a tremendous improvement in the wound. It is smaller and has contracted considerably. There is hypertrophic granulation tissue present. No malodorous drainage. 10/16/2021: The wound has contracted further and is epithelializing. The surface is very clean and the  hypertrophic granulation tissue has not recurred. She completed her course of doxycycline. 10/19; surgical wound on the right medial lower extremity secondary to Mohs surgery done in August. She is using Hydrofera Blue under 3 layer compression and making excellent progress. 11/02/2021: The wound is nearly completely epithelialized. Just a couple of open areas with some eschar overlying the surface. 11/12/2021: Just 1 small open area remains. It is clean without any slough or eschar accumulation. No hypertrophic granulation tissue present. 11/19/2021: Her wound is healed. READMISSION 12/07/2021 The patient has been moving for the past couple of weeks and has spent a substantial amount of time on her feet. As result, she developed leg swelling and the tissue where her wound had been, which she was protecting with a Band-Aid, broke down considerably. She now has a large wound that is irregular with extensive slough, involving the fat layer. She also developed a blister on her toe that has slough and eschar present. She also pointed out lump just above her left knee that she is concerned about, as she does have a history of multiple skin cancers. 12/11; patient comes in the clinic with a large wound on the right anterior lower leg. This was initially a skin cancer underwent Mohs surgery and was treated in this clinic and healed out on 11/13. The patient states the wound reopened with edema and also trauma from a dressing she was putting on it. 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 She also has a wound on the right second toe. The patient states quite adamantly she will not go back to her previous dermatologist for the skin surgery center in [place] blaming them for the original wound on her right lateral leg. [name] am not exactly sure of her rationale but at this point she certainly will not consider a referral there 12/18; the patient has an appointment at  [facility] on 12/18 although [name] am not  sure exactly who she is seeing. This is in reference to the skin cancer just above her left patella. She would not go back to the skin surgery or indeed her dermatologist in [place]. She had an area on the right second toe this is healed today. Her original surgical site on the right anterior lateral lower leg actually looks some better. [name] changed her to Iodoflex last week Electronic Signature(s) Signed: 12/24/2021 5:06:55 PM By: [name] MD Entered By: [name] on 12/24/2021 11:28:40 Physical Exam Details -------------------------------------------------------------------------------- Haskell Flirt [name] ([id number]) [id number] Page 3 of 8 Patient Name: Date of Service: MARGARETT, VITI [name]. 12/24/2021 10:30 A M Medical Record Number: [id number] Patient Account Number: [id number] Date of Birth/Sex: Treating RN: 06/18/1945 (76 y.o. F) Primary Care Provider: Domingo Mend Other Clinician: Referring Provider: Treating Provider/Extender: Allene Pyo in Treatment: 51 Constitutional Patient is hypertensive.. Pulse regular and within target range for patient.Marland Kitchen Respirations regular, non-labored and within target range.. Temperature is normal and within the target range for the patient.Marland Kitchen Appears in no distress. Notes Wound exam; right lateral lower leg this looks better than than last week and is slightly smaller smaller still requiring a debridement of nonviable subcutaneous tissue over the surface hemostasis with direct pressure. The biopsy site on the left thigh just above the patella looks about the same. Right second toe is healed Electronic Signature(s) Signed: 12/24/2021 5:06:55 PM By: [name] MD Entered By: [name] on 12/24/2021 11:29:51 -------------------------------------------------------------------------------- Physician Orders Details Patient Name: Date of  Service: [name] [name]. 12/24/2021 10:30 A M Medical Record Number: [id number] Patient Account Number: [id number] Date of Birth/Sex: Treating RN: 04-May-1945 (76 y.o. [name] Primary Care Provider: Domingo Mend Other Clinician: Referring Provider: Treating Provider/Extender: Allene Pyo in Treatment: [phone] Verbal / Phone Orders: No Diagnosis Coding Follow-up Appointments ppointment in 1 week. - Dr. [name] Rm 1 Return A Anesthetic Wound #4 Left Knee (In clinic) Topical Lidocaine 5% applied to wound bed - in clinic, prior to debridement Bathing/ Shower/ Hygiene May shower with protection but do not get wound dressing(s) wet. - cover dressing on Right leg to prevent dressing from getting wet. Keep dressing dry at all times. Edema Control - Lymphedema / SCD / Other Elevate legs to the level of the heart or above for 30 minutes daily and/or when sitting, a frequency of: Avoid standing for long periods of time. Moisturize legs daily. Wound Treatment Wound #2 - Lower Leg Wound Laterality: Right, Lateral Cleanser: Soap and Water 1 x Per Week/30 Days Discharge Instructions: May shower and wash wound with dial antibacterial soap and water prior to dressing change. Cleanser: Wound Cleanser 1 x Per Week/30 Days Discharge Instructions: Cleanse the wound with wound cleanser prior to applying a clean dressing using gauze sponges, not tissue or cotton balls. Peri-Wound Care: Sween Lotion (Moisturizing lotion) 1 x Per Week/30 Days Discharge Instructions: Apply moisturizing lotion as directed Topical: Mupirocin Ointment 1 x Per Week/30 Days Discharge Instructions: Apply Mupirocin (Bactroban) as instructed Prim Dressing: IODOFLEX 0.9% Cadexomer Iodine Pad 4x6 cm 1 x Per Week/30 Days ary Discharge Instructions: Apply to wound bed as instructed Secondary Dressing: Zetuvit Plus 4x8 in 1 x Per Week/30 Days AVAIAH, STEMPEL [name] ([id number])  [id number] Page 4 of 8 Discharge Instructions: Apply over primary dressing as directed. Secured With: Transpore Surgical Tape, 2x10 (in/yd) 1 x Per Week/30 Days Discharge Instructions: Secure dressing with tape as directed. Compression Wrap: ThreePress (  3 layer compression wrap) 1 x Per Week/30 Days Discharge Instructions: Apply three layer compression as directed. Wound #4 - Knee Wound Laterality: Left Cleanser: Soap and Water 1 x Per Day/30 Days Discharge Instructions: May shower and wash wound with dial antibacterial soap and water prior to dressing change. Cleanser: Wound Cleanser 1 x Per Day/30 Days Discharge Instructions: Cleanse the wound with wound cleanser prior to applying a clean dressing using gauze sponges, not tissue or cotton balls. Peri-Wound Care: Sween Lotion (Moisturizing lotion) 1 x Per Day/30 Days Discharge Instructions: Apply moisturizing lotion as directed Prim Dressing: Sorbalgon AG Dressing, 4x4 (in/in) (Generic) 1 x Per Day/30 Days ary Discharge Instructions: Apply to wound bed as instructed Secondary Dressing: Zetuvit Plus Silicone Border Dressing 4x4 (in/in) (Generic) 1 x Per Day/30 Days Discharge Instructions: Apply silicone border over primary dressing as directed. Electronic Signature(s) Signed: 12/24/2021 5:06:55 PM By: [name] MD Signed: 12/24/2021 5:09:12 PM By: Blanche East RN Entered By: Blanche East on 12/24/2021 10:54:01 -------------------------------------------------------------------------------- Problem List Details Patient Name: Date of Service: [name] [name]. 12/24/2021 10:30 A M Medical Record Number: [id number] Patient Account Number: [id number] Date of Birth/Sex: Treating RN: 09-Nov-1945 (77 y.o. F) Primary Care Provider: Domingo Mend Other Clinician: Referring Provider: Treating Provider/Extender: Allene Pyo in Treatment: 13 Active  Problems ICD-10 Encounter Code Description Active Date MDM Diagnosis L97.812 Non-pressure chronic ulcer of other part of right lower leg with fat layer 09/20/2021 No Yes exposed L97.512 Non-pressure chronic ulcer of other part of right foot with fat layer exposed 12/07/2021 No Yes L98.9 Disorder of the skin and subcutaneous tissue, unspecified 12/07/2021 No Yes I10 Essential (primary) hypertension 09/20/2021 No Yes K50.90 Crohn's disease, unspecified, without complications 05/24/6158 No Yes E43 Unspecified severe protein-calorie malnutrition 09/20/2021 No Yes AMIRRAH, QUIGLEY [name] ([id number]) [phone].pdf Page 5 of 8 Inactive Problems ICD-10 Code Description Active Date Inactive Date T81.31XS Disruption of external operation (surgical) wound, not elsewhere classified, sequela 09/20/2021 09/20/2021 C44.722 Squamous cell carcinoma of skin of right lower limb, including hip 09/20/2021 09/20/2021 Resolved Problems Electronic Signature(s) Signed: 12/24/2021 5:06:55 PM By: [name] MD Entered By: [name] on 12/24/2021 11:26:20 -------------------------------------------------------------------------------- Progress Note Details Patient Name: Date of Service: [name] [name]. 12/24/2021 10:30 A M Medical Record Number: [id number] Patient Account Number: [id number] Date of Birth/Sex: Treating RN: [birth year] (76 y.o. F) Primary Care Provider: Domingo Mend Other Clinician: Referring Provider: Treating Provider/Extender: Allene Pyo in Treatment: 13 Subjective History of Present Illness (HPI) ADMISSION 11/15/2020 This is a 76 year old woman with a history of squamous cell cancer skin cancer followed by Dr. Anabel Bene dermatology. She describes having a wound on the right posterior calf dating back since September. She saw her primary doctor in early October who suggested petroleum infused gauze. The patient has been doing this. No  real improvement in fact she says this is getting larger. She does not complain much of pain. She is completely uncertain about how this happened there was no trauma she simply became aware of it at some point describing it is about the size of a quarter but it has been getting progressively larger since then. She is here for review of this. Past medical history Crohn's disease not currently on prednisone, gastroesophageal reflux disease, history of squamous cell skin cancer, history of rheumatoid arthritis, hypertension 11/16; this lady had a large superficial atypical wound on the right posterior calf. She has significant solar skin damage probably some degree of  venous insufficiency but she described a rapidly expanding wound area. Because of this and a history of skin cancer [name] went ahead and did a shave biopsy but we do not have the results of this at the time we saw her in clinic today. She has been using silver alginate and kerlix 11/30; shave biopsy [name] did last time did not show any malignancy or fungal elements. Suggestion of a stasis ulcer. With our treatment which is simply been silver alginate with kerlix that she is changing daily the wound is down dramatically almost surprising how rapidly it is reduced in surface area 12/14; patient comes in today with her wound healed this was on the right posterior calf. She has significant solar skin damage probably some degree of venous insufficiency however she described a rapidly expanding wound area on admission. Because of this and a history of skin cancer [name] did do a shave biopsy but nothing was really found of worry. 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 Unfortunately, the wound has failed  to heal. There is thick slough on the wound surface. The periwound skin is in reasonably good condition. There is granulation tissue forming underneath the slough. 09/28/2021: The wound has deteriorated rather significantly over the past week. She had fairly copious drainage that she noticed as early as Monday, but she did not contact our office. Today there is a lot of periwound skin breakdown and a very pungent odor coming from the wound. There is thick slough on the wound surface but there is still good granulation tissue underlying this. Despite this, however, the wound actually measures smaller. 10/05/2021: The culture that [name] took grew out MRSA. [name] prescribed doxycycline and she is still taking this. There has been a tremendous improvement in the wound. It is smaller and has contracted considerably. There is hypertrophic granulation tissue present. No malodorous drainage. 10/16/2021: The wound has contracted further and is epithelializing. The surface is very clean and the hypertrophic granulation tissue has not recurred. She completed her course of doxycycline. 10/19; surgical wound on the right medial lower extremity secondary to Mohs surgery done in August. She is using Hydrofera Blue under 3 layer compression and making excellent progress. LEILANY, DIGERONIMO [name] ([id number]) [id number] Page 6 of 8 11/02/2021: The wound is nearly completely epithelialized. Just a couple of open areas with some eschar overlying the surface. 11/12/2021: Just 1 small open area remains. It is clean without any slough or eschar accumulation. No hypertrophic granulation tissue present. 11/19/2021: Her wound is healed. READMISSION 12/07/2021 The patient has been moving for the past couple of weeks and has spent a substantial amount of time on her feet. As result, she developed leg swelling and the tissue where her wound had been, which she was protecting with a Band-Aid, broke down considerably.  She now has a large wound that is irregular with extensive slough, involving the fat layer. She also developed a blister on her toe that has slough and eschar present. She also pointed out lump just above her left knee that she is concerned about, as she does have a history of multiple skin cancers. 12/11; patient comes in the clinic with a large wound on the right anterior lower leg. This was initially a skin cancer underwent Mohs  surgery and was treated in this clinic and healed out on 11/13. The patient states the wound reopened with edema and also trauma from a dressing she was putting on it. 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 She also has a wound on the right second toe. The patient states quite adamantly she will not go back to her previous dermatologist for the skin surgery center in [place] blaming them for the original wound on her right lateral leg. [name] am not exactly sure of her rationale but at this point she certainly will not consider a referral there 12/18; the patient has an appointment at [facility] on 12/18 although [name] am not sure exactly who she is seeing. This is in reference to the skin cancer just above her left patella. She would not go back to the skin surgery or indeed her dermatologist in [place]. She had an area on the right second toe this is healed today. Her original surgical site on the right anterior lateral lower leg actually looks some better. [name] changed her to Iodoflex last week Objective Constitutional Patient is hypertensive.. Pulse regular and within target range for patient.Marland Kitchen Respirations regular, non-labored and within target range.. Temperature is normal and within the target range for the patient.Marland Kitchen Appears in no distress. Vitals Time Taken: 10:28 AM, Height: 60 in, Weight: 101 lbs, BMI: 19.7, Temperature: 97.8 F, Pulse: 93 bpm, Respiratory Rate: 18 breaths/min, Blood Pressure: 186/95 mmHg. General Notes:  Wound exam; right lateral lower leg this looks better than than last week and is slightly smaller smaller still requiring a debridement of nonviable subcutaneous tissue over the surface hemostasis with direct pressure. The biopsy site on the left thigh just above the patella looks about the same. Right second toe is healed Integumentary (Hair, Skin) Wound #2 status is Open. Original cause of wound was Surgical Injury. The date acquired was: 08/17/2021. The wound has been in treatment 13 weeks. The wound is located on the Right,Lateral Lower Leg. The wound measures 4.7cm length x 2.8cm width x 0.2cm depth; 10.336cm^2 area and 2.067cm^3 volume. There is Fat Layer (Subcutaneous Tissue) exposed. There is no tunneling or undermining noted. There is a large amount of serous drainage noted. There is large (67-100%) red granulation within the wound bed. There is a small (1-33%) amount of necrotic tissue within the wound bed including Eschar. The periwound skin appearance had no abnormalities noted for texture. The periwound skin appearance had no abnormalities noted for moisture. The periwound skin appearance had no abnormalities noted for color. Periwound temperature was noted as No Abnormality. Wound #3 status is Healed - Epithelialized. Original cause of wound was Blister. The date acquired was: 11/23/2021. The wound has been in treatment 2 weeks. The wound is located on the T Third. The wound measures 0cm length x 0cm width x 0cm depth; 0cm^2 area and 0cm^3 volume. There is no tunneling or oe undermining noted. There is a none present amount of drainage noted. The wound margin is flat and intact. There is large (67-100%) red granulation within the wound bed. There is no necrotic tissue within the wound bed. The periwound skin appearance had no abnormalities noted for texture. The periwound skin appearance had no abnormalities noted for color. The periwound skin appearance did not exhibit: Dry/Scaly,  Maceration. Periwound temperature was noted as No Abnormality. Wound #4 status is Open. Original cause of wound was Other Lesion. The date acquired was: 11/23/2021. The wound has been in treatment 2 weeks.  The wound is located on the Left Knee. The wound measures 0.5cm length x 0.8cm width x 0.2cm depth; 0.314cm^2 area and 0.063cm^3 volume. There is no tunneling or undermining noted. There is a medium amount of serous drainage noted. There is no granulation within the wound bed. There is a large (67-100%) amount of necrotic tissue within the wound bed including Eschar and Adherent Slough. The periwound skin appearance had no abnormalities noted for texture. The periwound skin appearance had no abnormalities noted for color. The periwound skin appearance exhibited: Dry/Scaly. The periwound skin appearance did not exhibit: Maceration. 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 There was a Excisional Skin/Subcutaneous Tissue Debridement with a total area of 13.16 sq cm performed by Ricard Dillon., MD. With the following instrument(s): Curette to remove Viable tissue/material. Material removed includes Subcutaneous Tissue and Slough and after achieving pain control using Lidocaine 5% topical ointment. No specimens were taken. A time out was conducted at 10:50, prior to the start of the procedure. A  Minimum amount of bleeding was controlled with Pressure. The procedure was tolerated well with a pain level of 0 throughout and a pain level of 0 following the procedure. Post Debridement Measurements: 4.7cm length x 2.8cm width x 0.2cm depth; 2.067cm^3 volume. Character of Wound/Ulcer Post Debridement requires further debridement. Severity of Tissue Post Debridement is: Fat layer exposed. Post procedure Diagnosis Wound #2: Same as Pre-Procedure General Notes: Scribed for Dr. Dellia Nims by Blanche East, RN. Plan Follow-up Appointments: Return Appointment in 1 week. - Dr. [name] Rm 1 Anesthetic: Wound #4 Left Knee: (In clinic) Topical Lidocaine 5% applied to wound bed - in clinic, prior to debridement Bathing/ Shower/ Hygiene: May shower with protection but do not get wound dressing(s) wet. - cover dressing on Right leg to prevent dressing from getting wet. Keep dressing dry at all times. Edema Control - Lymphedema / SCD / Other: Elevate legs to the level of the heart or above for 30 minutes daily and/or when sitting, a frequency of: Avoid standing for long periods of time. Moisturize legs daily. WOUND #2: - Lower Leg Wound Laterality: Right, Lateral Cleanser: Soap and Water 1 x Per Week/30 Days Discharge Instructions: May shower and wash wound with dial antibacterial soap and water prior to dressing change. Cleanser: Wound Cleanser 1 x Per Week/30 Days Discharge Instructions: Cleanse the wound with wound cleanser prior to applying a clean dressing using gauze sponges, not tissue or cotton balls. Peri-Wound Care: Sween Lotion (Moisturizing lotion) 1 x Per Week/30 Days Discharge Instructions: Apply moisturizing lotion as directed Topical: Mupirocin Ointment 1 x Per Week/30 Days Discharge Instructions: Apply Mupirocin (Bactroban) as instructed Prim Dressing: IODOFLEX 0.9% Cadexomer Iodine Pad 4x6 cm 1 x Per Week/30 Days ary Discharge Instructions: Apply to wound bed as instructed Secondary  Dressing: Zetuvit Plus 4x8 in 1 x Per Week/30 Days Discharge Instructions: Apply over primary dressing as directed. Secured With: Transpore Surgical T ape, 2x10 (in/yd) 1 x Per Week/30 Days Discharge Instructions: Secure dressing with tape as directed. Com pression Wrap:  ThreePress (3 layer compression wrap) 1 x Per Week/30 Days Discharge Instructions: Apply three layer compression as directed. WOUND #4: - Knee Wound Laterality: Left Cleanser: Soap and Water 1 x Per Day/30 Days Discharge Instructions: May shower and wash wound with dial antibacterial soap and water prior to dressing change. Cleanser: Wound Cleanser 1 x Per Day/30 Days Discharge Instructions: Cleanse the wound with wound cleanser prior to applying a clean dressing using gauze sponges, not tissue or cotton balls. Peri-Wound Care: Sween Lotion (Moisturizing lotion) 1 x Per Day/30 Days Discharge Instructions: Apply moisturizing lotion as directed Prim Dressing: Sorbalgon AG Dressing, 4x4 (in/in) (Generic) 1 x Per Day/30 Days ary Discharge Instructions: Apply to wound bed as instructed Secondary Dressing: Zetuvit Plus Silicone Border Dressing 4x4 (in/in) (Generic) 1 x Per Day/30 Days Discharge Instructions: Apply silicone border over primary dressing as directed. 1. We continued with the Iodoflex under compression on the right leg. 2. Silver alginate to the biopsy site on the left upper leg. 3. Her right second toe is healed [name] advised her to keep that toe covered and separated from the third toe. 4. The patient is going to [facility] on 12/28 although [name] am not exactly sure who she is seeing [name] had wanted her does be seen by skin surgery although she seems to think it is plastic surgery Electronic Signature(s) Signed: 12/24/2021 5:06:55 PM By: [name] MD Entered By: [name] on 12/24/2021 11:31:14 Nikkel, Parks Ranger ([id number]) [id number] Page 8 of  8 -------------------------------------------------------------------------------- SuperBill Details Patient Name: Date of Service: SAVAHNA, CASADOS 12/24/2021 Medical Record Number: [id number] Patient Account Number: [id number] Date of Birth/Sex: Treating RN: Feb 25, 1945 (76 y.o. 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 L98.9 Disorder of the skin and subcutaneous tissue, unspecified I10 Essential (primary) hypertension K50.90 Crohn's disease, unspecified, without complications O71 Unspecified severe protein-calorie malnutrition Facility Procedures : CPT4 Code: [id number] Description: 25498 - DEB SUBQ TISSUE 20 SQ CM/< ICD-10 Diagnosis Description Y64.158 Non-pressure chronic ulcer of other part of right lower leg with fat layer exp Modifier: osed Quantity: 1 Physician Procedures : CPT4 Code Description Modifier [id number] - WC PHYS SUBQ TISS 20 SQ CM ICD-10 Diagnosis Description J03.159 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d Quantity: 1 Electronic Signature(s) Signed: 12/24/2021 5:06:55 PM By: [name] MD Entered By: [name] on 12/24/2021 11:31:29

## 2021-12-25 NOTE — Progress Notes (Signed)
MAURINA, FAWAZ I ([id number]) [id number] Page 1 of 11 Visit Report for 12/24/2021 Arrival Information Details Patient Name: Date of Service: [name], [name]. 12/24/2021 10:30 A M Medical Record Number: [id number] Patient Account Number: [id number] Date of Birth/Sex: Treating RN: 02-15-1945 (76 y.o. [name], [name] Primary Care Marua Qin: Domingo Mend Other Clinician: Referring Krisinda Giovanni: Treating Jaasiel [place]field/Extender: Allene Pyo in Treatment: 46 Visit Information History Since Last Visit Added or deleted any medications: No Patient Arrived: Ambulatory Any new allergies or adverse reactions: No Arrival Time: 10:28 Had a fall or experienced change in No Accompanied By: husband activities of daily living that may affect Transfer Assistance: None risk of falls: Patient Identification Verified: Yes Signs or symptoms of abuse/neglect since last visito No Secondary Verification Process Completed: Yes Hospitalized since last visit: No Patient Requires Transmission-Based Precautions: No Implantable device outside of the clinic excluding No Patient Has Alerts: No cellular tissue based products placed in the center since last visit: Has Dressing in Place as Prescribed: Yes Has Compression in Place as Prescribed: Yes Pain Present Now: Yes Electronic Signature(s) Signed: 12/24/2021 5:09:12 PM By: Blanche East RN Entered By: Blanche East on 12/24/2021 10:28:31 -------------------------------------------------------------------------------- Encounter Discharge Information Details Patient Name: Date of Service: April Austin I. 12/24/2021 10:30 A M Medical Record Number: [id number] Patient Account Number: [id number] Date of Birth/Sex: Treating RN: [birth year] (76 y.o. [name] Primary Care Celie Desrochers: Domingo Mend Other Clinician: Referring Ronnell Makarewicz: Treating Kayron Kalmar/Extender: Allene Pyo in Treatment: 13 Encounter Discharge Information Items Post Procedure Vitals Discharge Condition: Stable Temperature (F): 97.8 Ambulatory Status: Ambulatory Pulse (bpm): 93 Discharge Destination: Home Respiratory Rate (breaths/min): 18 Transportation: Private Auto Blood Pressure (mmHg): 186/95 Accompanied By: spouse Schedule Follow-up Appointment: Yes Clinical Summary of Care: Electronic Signature(s) Signed: 12/24/2021 5:09:12 PM By: Blanche East RN Entered By: Blanche East on 12/24/2021 11:08:13 [name] ([id number]) [id number] Page 2 of 11 -------------------------------------------------------------------------------- Lower Extremity Assessment Details Patient Name: Date of Service: [name], [name]. 12/24/2021 10:30 A M Medical Record Number: [id number] Patient Account Number: [id number] Date of Birth/Sex: Treating RN: 03-27-1945 (76 y.o. [name] Primary Care Kasem Mozer: Domingo Mend Other Clinician: Referring Mahlia Fernando: Treating Tyshia Fenter/Extender: Allene Pyo in Treatment: 13 Edema Assessment Assessed: Shirlyn Goltz: No] [Right: No] Edema: [Left: N] [Right: o] Calf Left: Right: Point of Measurement: From Medial Instep 28 cm 29 cm Ankle Left: Right: Point of Measurement: From Medial Instep 19 cm 18 cm Vascular Assessment Pulses: Dorsalis Pedis Palpable: [Left:Yes] [Right:Yes] Electronic Signature(s) Signed: 12/24/2021 5:09:12 PM By: Blanche East RN Entered By: Blanche East on 12/24/2021 10:37:40 -------------------------------------------------------------------------------- Multi Wound Chart Details Patient Name: Date of Service: April Austin I. 12/24/2021 10:30 A M Medical Record Number: [id number] Patient Account Number: [id number] Date of Birth/Sex: Treating RN: May 17, 1945 (76 y.o. F) Primary Care Jillann Charette: Domingo Mend Other Clinician: Referring Shemekia Patane: Treating  Avrianna Smart/Extender: Allene Pyo in Treatment: 13 Vital Signs Height(in): 60 Pulse(bpm): 93 Weight(lbs): 101 Blood Pressure(mmHg): 186/95 Body Mass Index(BMI): 19.7 Temperature(F): 97.8 Respiratory Rate(breaths/min): 18 [2:Photos:] [[id number] Page 3 of 11] Right, Lateral Lower Leg T Third oe Left Knee Wound Location: Surgical Injury Blister Other Lesion Wounding Event: Open Surgical Wound Pressure Ulcer Lesion Primary Etiology: Venous Leg Ulcer N/A N/A Secondary Etiology: Cataracts, Chronic sinus Cataracts, Chronic sinus Cataracts, Chronic sinus Comorbid History: problems/congestion, Hypertension, problems/congestion, Hypertension, problems/congestion, Hypertension, Crohns, Osteoarthritis Crohns, Osteoarthritis Crohns, Osteoarthritis 08/17/2021 11/23/2021 11/23/2021  Date Acquired: _0 Weeks of Treatment: Open Healed - Epithelialized Open Wound Status: No No No Wound Recurrence: Yes No No Clustered Wound: 4.7x2.8x0.2 0x0x0 0.5x0.8x0.2 Measurements L x W x D (cm) 10.336 0 0.314 A (cm) : rea 2.067 0 0.063 Volume (cm) : 43.00% 100.00% -149.20% % Reduction in A rea: -13.90% 100.00% -384.60% % Reduction in Volume: Full Thickness Without Exposed Category/Stage II Full Thickness Without Exposed Classification: Support Structures Support Structures Large None Present Medium Exudate A mount: Serous N/A Serous Exudate Type: amber N/A amber Exudate Color: N/A Flat and Intact N/A Wound Margin: Large (67-100%) Large (67-100%) None Present (0%) Granulation A mount: Red Red N/A Granulation Quality: Small (1-33%) None Present (0%) Large (67-100%) Necrotic A mount: Eschar N/A Eschar, Adherent Slough Necrotic Tissue: Fat Layer (Subcutaneous Tissue): Yes Fascia: No N/A Exposed Structures: Fascia: No Fat Layer (Subcutaneous Tissue): No Tendon: No Tendon: No Muscle: No Muscle: No Joint: No Joint:  No Bone: No Bone: No Medium (34-66%) N/A Small (1-33%) Epithelialization: Debridement - Excisional N/A N/A Debridement: Pre-procedure Verification/Time Out 10:50 N/A N/A Taken: Lidocaine 5% topical ointment N/A N/A Pain Control: Subcutaneous, Slough N/A N/A Tissue Debrided: Skin/Subcutaneous Tissue N/A N/A Level: 13.16 N/A N/A Debridement A (sq cm): rea Curette N/A N/A Instrument: Minimum N/A N/A Bleeding: Pressure N/A N/A Hemostasis A chieved: 0 N/A N/A Procedural Pain: 0 N/A N/A Post Procedural Pain: Procedure was tolerated well N/A N/A Debridement Treatment Response: 4.7x2.8x0.2 N/A N/A Post Debridement Measurements L x W x D (cm) 2.067 N/A N/A Post Debridement Volume: (cm) Excoriation: No No Abnormalities Noted No Abnormalities Noted Periwound Skin Texture: Induration: No Callus: No Crepitus: No Rash: No Scarring: No Dry/Scaly: No Maceration: No Dry/Scaly: Yes Periwound Skin Moisture: Dry/Scaly: No Maceration: No Atrophie Blanche: No No Abnormalities Noted No Abnormalities Noted Periwound Skin Color: Cyanosis: No Ecchymosis: No Erythema: No Hemosiderin Staining: No Mottled: No Pallor: No Rubor: No No Abnormality No Abnormality N/A Temperature: Debridement N/A N/A Procedures Performed: Treatment Notes Wound #2 (Lower Leg) Wound Laterality: Right, Lateral Cleanser Soap and Water Discharge Instruction: May shower and wash wound with dial antibacterial soap and water prior to dressing change. Wound Cleanser Discharge Instruction: Cleanse the wound with wound cleanser prior to applying a clean dressing using gauze sponges, not tissue or cotton balls. Peri-Wound Care Sween Lotion (Moisturizing lotion) Discharge Instruction: Apply moisturizing lotion as directed AAYAT, HAJJAR I ([id number]) [id number] Page 4 of 11 Topical Mupirocin Ointment Discharge Instruction: Apply Mupirocin (Bactroban) as instructed Primary  Dressing IODOFLEX 0.9% Cadexomer Iodine Pad 4x6 cm Discharge Instruction: Apply to wound bed as instructed Secondary Dressing Zetuvit Plus 4x8 in Discharge Instruction: Apply over primary dressing as directed. Secured With Transpore Surgical Tape, 2x10 (in/yd) Discharge Instruction: Secure dressing with tape as directed. Compression Wrap ThreePress (3 layer compression wrap) Discharge Instruction: Apply three layer compression as directed. Compression Stockings Add-Ons Wound #3 (Toe Third) Cleanser Peri-Wound Care Topical Primary Dressing Secondary Dressing Secured With Compression Wrap Compression Stockings Add-Ons Wound #4 (Knee) Wound Laterality: Left Cleanser Soap and Water Discharge Instruction: May shower and wash wound with dial antibacterial soap and water prior to dressing change. Wound Cleanser Discharge Instruction: Cleanse the wound with wound cleanser prior to applying a clean dressing using gauze sponges, not tissue or cotton balls. Peri-Wound Care Sween Lotion (Moisturizing lotion) Discharge Instruction: Apply moisturizing lotion as directed Topical Primary Dressing Sorbalgon AG Dressing, 4x4 (in/in) Discharge Instruction: Apply to wound bed as instructed Secondary Dressing Zetuvit Plus Silicone Border Dressing 4x4 (in/in) Discharge  Instruction: Apply silicone border over primary dressing as directed. Secured With Compression Wrap Compression Stockings Environmental education officer) Signed: 12/24/2021 5:06:55 PM By: [name] MD Liebman, Adrina 12/24/2021 5:06:55 PM By: [name] MD Signed: I ([id number]) [id number] Page 5 of 11 Entered By: [name] on 12/24/2021 11:26:38 -------------------------------------------------------------------------------- Multi-Disciplinary Care Plan Details Patient Name: Date of Service: [name], [name] 12/24/2021 10:30 A M Medical Record Number: [id number] Patient Account  Number: [id number] Date of Birth/Sex: Treating RN: 06/29/1945 (76 y.o. [name] Primary Care Damieon Armendariz: Domingo Mend Other Clinician: Referring Kairyn Olmeda: Treating Reign Bartnick/Extender: Allene Pyo in Treatment: [place] reviewed with physician Active Inactive Abuse / Safety / Falls / Self Care Management Nursing Diagnoses: History of Falls Potential for falls Goals: Patient will not experience any injury related to falls Date Initiated: 12/07/2021 Target Resolution Date: 02/01/2022 Goal Status: Active Patient/caregiver will verbalize/demonstrate measures taken to prevent injury and/or falls Date Initiated: 09/20/2021 Date Inactivated: 10/19/2021 Target Resolution Date: 10/18/2021 Goal Status: Met Interventions: Assess fall risk on admission and as needed Assess impairment of mobility on admission and as needed per policy Notes: Pressure Nursing Diagnoses: Knowledge deficit related to causes and risk factors for pressure ulcer development Goals: Patient will remain free of pressure ulcers Date Initiated: 12/07/2021 Target Resolution Date: 02/01/2022 Goal Status: Active Interventions: Assess potential for pressure ulcer upon admission and as needed Notes: Electronic Signature(s) Signed: 12/24/2021 5:09:12 PM By: Blanche East RN Entered By: Blanche East on 12/24/2021 10:54:10 -------------------------------------------------------------------------------- Pain Assessment Details Patient Name: Date of Service: April Austin I. 12/24/2021 10:30 A M Medical Record Number: [id number] Patient Account Number: [id number] Date of Birth/Sex: Treating RN: 07-Oct-1945 (76 y.o. [address] [place], [place] I ([id number]) [id number] Page 6 of 11 Primary Care Nithin [name]: Domingo Mend Other Clinician: Referring Piper Albro: Treating Neema Fluegge/Extender: Allene Pyo in  Treatment: 13 Active Problems Location of Pain Severity and Description of Pain Patient Has Paino No Site Locations Rate the pain. Current Pain Level: 0 Pain Management and Medication Current Pain Management: Electronic Signature(s) Signed: 12/24/2021 5:09:12 PM By: Blanche East RN Entered By: Blanche East on 12/24/2021 10:31:45 -------------------------------------------------------------------------------- Patient/Caregiver Education Details Patient Name: Date of Service: April Austin I. 12/18/2023andnbsp10:30 A M Medical Record Number: [id number] Patient Account Number: [id number] Date of Birth/Gender: Treating RN: 14-Mar-1945 (76 y.o. [name] Primary Care Physician: Domingo Mend Other Clinician: Referring Physician: Treating Physician/Extender: Allene Pyo in Treatment: 13 Education Assessment Education Provided To: Patient Education Topics Provided Wound/Skin Impairment: Methods: Explain/Verbal Responses: Reinforcements needed, State content correctly Electronic Signature(s) Signed: 12/24/2021 5:09:12 PM By: Blanche East RN Entered By: Blanche East on 12/24/2021 10:54:26 Stirn, Parks Ranger ([id number]) [id number] Page 7 of 11 -------------------------------------------------------------------------------- Wound Assessment Details Patient Name: Date of Service: [name], [name]. 12/24/2021 10:30 A M Medical Record Number: [id number] Patient Account Number: [id number] Date of Birth/Sex: Treating RN: 1945-10-09 (76 y.o. [name] Primary Care Yosgar Demirjian: Domingo Mend Other Clinician: Referring Braden Deloach: Treating Kourtnie Sachs/Extender: Allene Pyo in Treatment: 13 Wound Status Wound Number: 2 Primary Open Surgical Wound Etiology: Wound Location: Right, Lateral Lower Leg Secondary Venous Leg Ulcer Wounding Event: Surgical Injury Etiology: Date Acquired:  08/17/2021 Wound Status: Open Weeks Of Treatment: 13 Comorbid Cataracts, Chronic sinus problems/congestion, Hypertension, Clustered Wound: Yes History: Crohns, Osteoarthritis Photos Wound Measurements Length: (cm) 4.7 Width: (cm) 2.8 Depth: (cm) 0.2 Area: (cm) 10.336 Volume: (  cm) 2.067 % Reduction in Area: 43% % Reduction in Volume: -13.9% Epithelialization: Medium (34-66%) Tunneling: No Undermining: No Wound Description Classification: Full Thickness Without Exposed Suppor Exudate Amount: Large Exudate Type: Serous Exudate Color: amber t Structures Foul Odor After Cleansing: No Slough/Fibrino Yes Wound Bed Granulation Amount: Large (67-100%) Exposed Structure Granulation Quality: Red Fascia Exposed: No Necrotic Amount: Small (1-33%) Fat Layer (Subcutaneous Tissue) Exposed: Yes Necrotic Quality: Eschar Tendon Exposed: No Muscle Exposed: No Joint Exposed: No Bone Exposed: No Periwound Skin Texture Texture Color No Abnormalities Noted: Yes No Abnormalities Noted: Yes Moisture Temperature / Pain No Abnormalities Noted: Yes Temperature: No Abnormality Treatment Notes Wound #2 (Lower Leg) Wound Laterality: Right, Lateral Cleanser Soap and Water Discharge Instruction: May shower and wash wound with dial antibacterial soap and water prior to dressing change. [name], [name] ([id number]) [id number] Page 8 of 11 Wound Cleanser Discharge Instruction: Cleanse the wound with wound cleanser prior to applying a clean dressing using gauze sponges, not tissue or cotton balls. Peri-Wound Care Sween Lotion (Moisturizing lotion) Discharge Instruction: Apply moisturizing lotion as directed Topical Mupirocin Ointment Discharge Instruction: Apply Mupirocin (Bactroban) as instructed Primary Dressing IODOFLEX 0.9% Cadexomer Iodine Pad 4x6 cm Discharge Instruction: Apply to wound bed as instructed Secondary Dressing Zetuvit Plus 4x8 in Discharge  Instruction: Apply over primary dressing as directed. Secured With Transpore Surgical Tape, 2x10 (in/yd) Discharge Instruction: Secure dressing with tape as directed. Compression Wrap ThreePress (3 layer compression wrap) Discharge Instruction: Apply three layer compression as directed. Compression Stockings Add-Ons Electronic Signature(s) Signed: 12/24/2021 5:09:12 PM By: Blanche East RN Entered By: Blanche East on 12/24/2021 10:41:54 -------------------------------------------------------------------------------- Wound Assessment Details Patient Name: Date of Service: April Austin I. 12/24/2021 10:30 A M Medical Record Number: [id number] Patient Account Number: [id number] Date of Birth/Sex: Treating RN: [birth year] (76 y.o. [name] Primary Care Gema Ringold: Domingo Mend Other Clinician: Referring Devony Mcgrady: Treating Genever Hentges/Extender: Allene Pyo in Treatment: 13 Wound Status Wound Number: 3 Primary Pressure Ulcer Etiology: Wound Location: T Third oe Wound Healed - Epithelialized Wounding Event: Blister Status: Date Acquired: 11/23/2021 Comorbid Cataracts, Chronic sinus problems/congestion, Hypertension, Weeks Of Treatment: 2 History: Crohns, Osteoarthritis Clustered Wound: No Photos Wound Measurements [name], [name] ([id number]) Length: (cm) 0 Width: (cm) 0 Depth: (cm) 0 Area: (cm) 0 Volume: (cm) 0 [id number] Page 9 of 11 % Reduction in Area: 100% % Reduction in Volume: 100% Tunneling: No Undermining: No Wound Description Classification: Category/Stage II Wound Margin: Flat and Intact Exudate Amount: None Present Foul Odor After Cleansing: No Slough/Fibrino No Wound Bed Granulation Amount: Large (67-100%) Exposed Structure Granulation Quality: Red Fascia Exposed: No Necrotic Amount: None Present (0%) Fat Layer (Subcutaneous Tissue) Exposed: No Tendon Exposed: No Muscle Exposed:  No Joint Exposed: No Bone Exposed: No Periwound Skin Texture Texture Color No Abnormalities Noted: Yes No Abnormalities Noted: Yes Moisture Temperature / Pain No Abnormalities Noted: No Temperature: No Abnormality Dry / Scaly: No Maceration: No Treatment Notes Wound #3 (Toe Third) Cleanser Peri-Wound Care Topical Primary Dressing Secondary Dressing Secured With Compression Wrap Compression Stockings Add-Ons Electronic Signature(s) Signed: 12/24/2021 5:09:12 PM By: Blanche East RN Entered By: Blanche East on 12/24/2021 10:53:09 -------------------------------------------------------------------------------- Wound Assessment Details Patient Name: Date of Service: April Austin I. 12/24/2021 10:30 A M Medical Record Number: [id number] Patient Account Number: [id number] Date of Birth/Sex: Treating RN: 11/07/1945 (76 y.o. [name] Primary Care Cristino Degroff: Domingo Mend Other Clinician: Referring Felix Pratt: Treating Saragrace Selke/Extender: Ladean Raya , Magdalen Spatz in  Treatment: 13 Wound Status Wound Number: 4 Primary Lesion Etiology: Wound Location: Left Knee Wound Open Wounding Event: Other Lesion Status: Date Acquired: 11/23/2021 [name], [name] ([id number]) [id number] Page 10 of 11 Date Acquired: 11/23/2021 Comorbid Cataracts, Chronic sinus problems/congestion, Hypertension, Weeks Of Treatment: 2 History: Crohns, Osteoarthritis Clustered Wound: No Photos Wound Measurements Length: (cm) 0.5 Width: (cm) 0.8 Depth: (cm) 0.2 Area: (cm) 0.314 Volume: (cm) 0.063 % Reduction in Area: -149.2% % Reduction in Volume: -384.6% Epithelialization: Small (1-33%) Tunneling: No Undermining: No Wound Description Classification: Full Thickness Without Exposed Support Structures Exudate Amount: Medium Exudate Type: Serous Exudate Color: amber Foul Odor After Cleansing: No Slough/Fibrino Yes Wound Bed Granulation  Amount: None Present (0%) Necrotic Amount: Large (67-100%) Necrotic Quality: Eschar, Adherent Slough Periwound Skin Texture Texture Color No Abnormalities Noted: Yes No Abnormalities Noted: Yes Moisture No Abnormalities Noted: No Dry / Scaly: Yes Maceration: No Treatment Notes Wound #4 (Knee) Wound Laterality: Left Cleanser Soap and Water Discharge Instruction: May shower and wash wound with dial antibacterial soap and water prior to dressing change. Wound Cleanser Discharge Instruction: Cleanse the wound with wound cleanser prior to applying a clean dressing using gauze sponges, not tissue or cotton balls. Peri-Wound Care Sween Lotion (Moisturizing lotion) Discharge Instruction: Apply moisturizing lotion as directed Topical Primary Dressing Sorbalgon AG Dressing, 4x4 (in/in) Discharge Instruction: Apply to wound bed as instructed Secondary Dressing Zetuvit Plus Silicone Border Dressing 4x4 (in/in) Discharge Instruction: Apply silicone border over primary dressing as directed. Secured With [employer] Compression Stockings [name], [name] ([id number]) [id number] Page 11 of 11 Add-Ons Electronic Signature(s) Signed: 12/24/2021 5:09:12 PM By: Blanche East RN Entered By: Blanche East on 12/24/2021 10:42:42 -------------------------------------------------------------------------------- Vitals Details Patient Name: Date of Service: April Austin I. 12/24/2021 10:30 A M Medical Record Number: [id number] Patient Account Number: [id number] Date of Birth/Sex: Treating RN: 09-22-1945 (76 y.o. [name], Jamie Primary Care Sriman Tally: Domingo Mend Other Clinician: Referring Dekendrick Uzelac: Treating Zoya Sprecher/Extender: Allene Pyo in Treatment: 13 Vital Signs Time Taken: 10:28 Temperature (F): 97.8 Height (in): 60 Pulse (bpm): 93 Weight (lbs): 101 Respiratory Rate (breaths/min): 18 Body Mass Index (BMI):  19.7 Blood Pressure (mmHg): 186/95 Reference Range: 80 - 120 mg / dl Electronic Signature(s) Signed: 12/24/2021 5:09:12 PM By: Blanche East RN Entered By: Blanche East on 12/24/2021 10:31:39

## 2022-01-01 ENCOUNTER — Other Ambulatory Visit (HOSPITAL_BASED_OUTPATIENT_CLINIC_OR_DEPARTMENT_OTHER): Payer: Self-pay | Admitting: General Surgery

## 2022-01-01 ENCOUNTER — Encounter (HOSPITAL_BASED_OUTPATIENT_CLINIC_OR_DEPARTMENT_OTHER): Payer: Medicare Other | Admitting: General Surgery

## 2022-01-01 DIAGNOSIS — L97812 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d: Secondary | ICD-10-CM | POA: Diagnosis not present

## 2022-01-01 NOTE — Progress Notes (Signed)
April, [name] I ([id number]) [id number] Page 1 of 9 Visit Report for 01/01/2022 Arrival Information Details Patient Name: Date of Service: [name], [name] 01/01/2022 10:45 A M Medical Record Number: [id number] Patient Account Number: [id number] Date of Birth/Sex: Treating RN: [birth year] (76 y.o. Harlow Ohms Primary Care Fischer Halley: Domingo Mend Other Clinician: Referring Kadeen Sroka: Treating Sora Vrooman/Extender: Caprice Kluver in Treatment: 14 Visit Information History Since Last Visit Added or deleted any medications: No Patient Arrived: Ambulatory Any new allergies or adverse reactions: No Arrival Time: 10:36 Had a fall or experienced change in No Accompanied By: self activities of daily living that may affect Transfer Assistance: None risk of falls: Patient Identification Verified: Yes Signs or symptoms of abuse/neglect since last visito No Secondary Verification Process Completed: Yes Hospitalized since last visit: No Patient Requires Transmission-Based Precautions: No Implantable device outside of the clinic excluding No Patient Has Alerts: No cellular tissue based products placed in the center since last visit: Has Dressing in Place as Prescribed: Yes Pain Present Now: No Electronic Signature(s) Signed: 01/01/2022 4:17:15 PM By: Adline Peals Entered By: Adline Peals on 01/01/2022 10:37:15 -------------------------------------------------------------------------------- Compression Therapy Details Patient Name: Date of Service: [name] I. 01/01/2022 10:45 A M Medical Record Number: [id number] Patient Account Number: [id number] Date of Birth/Sex: Treating RN: [birth year] (76 y.o. Harlow Ohms Primary Care Kwynn Schlotter: Domingo Mend Other Clinician: Referring Allyse Fregeau: Treating Japleen Tornow/Extender: Gevena Cotton Weeks in Treatment: 14 Compression Therapy  Performed for Wound Assessment: Wound #2 Right,Lateral Lower Leg Performed By: Clinician Adline Peals, RN Compression Type: Three Layer Post Procedure Diagnosis Same as Pre-procedure Electronic Signature(s) Signed: 01/01/2022 4:17:15 PM By: Adline Peals Entered By: Adline Peals on 01/01/2022 11:04:47 Claytor, Parks Ranger ([id number]) [id number] Page 2 of 9 -------------------------------------------------------------------------------- Encounter Discharge Information Details Patient Name: Date of Service: April, [name] 01/01/2022 10:45 A M Medical Record Number: [id number] Patient Account Number: [id number] Date of Birth/Sex: Treating RN: Jun 19, 1945 (76 y.o. Harlow Ohms Primary Care Woodfin Kiss: Domingo Mend Other Clinician: Referring Stephane Niemann: Treating Terianna Peggs/Extender: Gevena Cotton Weeks in Treatment: 14 Encounter Discharge Information Items Post Procedure Vitals Discharge Condition: Stable Temperature (F): 98 Ambulatory Status: Ambulatory Pulse (bpm): 84 Discharge Destination: Home Respiratory Rate (breaths/min): 18 Transportation: Private Auto Blood Pressure (mmHg): 149/81 Accompanied By: self Schedule Follow-up Appointment: Yes Clinical Summary of Care: Patient Declined Electronic Signature(s) Signed: 01/01/2022 4:17:15 PM By: Adline Peals Entered By: Adline Peals on 01/01/2022 11:27:45 -------------------------------------------------------------------------------- Lower Extremity Assessment Details Patient Name: Date of Service: [name], [name] 01/01/2022 10:45 A M Medical Record Number: [id number] Patient Account Number: [id number] Date of Birth/Sex: Treating RN: 06-04-1945 (76 y.o. Harlow Ohms Primary Care Kayani Rapaport: Domingo Mend Other Clinician: Referring Kelliann Pendergraph: Treating Ophia Shamoon/Extender: Gevena Cotton Weeks in Treatment:  14 Edema Assessment Assessed: [Left: No] [Right: No] Edema: [Left: N] [Right: o] Calf Left: Right: Point of Measurement: From Medial Instep 28 cm 27 cm Ankle Left: Right: Point of Measurement: From Medial Instep 19 cm 19 cm Vascular Assessment Pulses: Dorsalis Pedis Palpable: [Right:Yes] Electronic Signature(s) Signed: 01/01/2022 4:17:15 PM By: Adline Peals Entered By: Adline Peals on 01/01/2022 10:45:28 Multi Wound Chart Details -------------------------------------------------------------------------------- [name] ([id number]) [id number] Page 3 of 9 Patient Name: Date of Service: April, [name] 01/01/2022 10:45 A M Medical Record Number: [id number] Patient Account Number: [id number] Date of Birth/Sex: Treating RN: Jun 29, 1945 (76 y.o. F) Primary Care Cipriano Millikan: Jerilee Hoh ,  Estela Other Clinician: Referring Bailley Guilford: Treating Kaylib Furness/Extender: Gevena Cotton Weeks in Treatment: 14 Vital Signs Height(in): 60 Pulse(bpm): 34 Weight(lbs): 101 Blood Pressure(mmHg): 149/81 Body Mass Index(BMI): 19.7 Temperature(F): 98 Respiratory Rate(breaths/min): 18 Wound Assessments Wound Number: 2 4 N/A Photos: N/A Right, Lateral Lower Leg Left Knee N/A Wound Location: Surgical Injury Other Lesion N/A Wounding Event: Open Surgical Wound Lesion N/A Primary Etiology: Venous Leg Ulcer N/A N/A Secondary Etiology: Cataracts, Chronic sinus Cataracts, Chronic sinus N/A Comorbid History: problems/congestion, Hypertension, problems/congestion, Hypertension, Crohns, Osteoarthritis Crohns, Osteoarthritis 08/17/2021 11/23/2021 N/A Date Acquired: 14 3 N/A Weeks of Treatment: Open Open N/A Wound Status: No No N/A Wound Recurrence: Yes No N/A Clustered Wound: 4.9x2.4x0.1 0.5x0.6x0.1 N/A Measurements L x W x D (cm) 9.236 0.236 N/A A (cm) : rea 0.924 0.024 N/A Volume (cm) : 49.10% -87.30% N/A % Reduction in A  rea: 49.10% -84.60% N/A % Reduction in Volume: Full Thickness Without Exposed Full Thickness Without Exposed N/A Classification: Support Structures Support Structures Large None Present N/A Exudate A mount: Serous N/A N/A Exudate Type: amber N/A N/A Exudate Color: Distinct, outline attached Distinct, outline attached N/A Wound Margin: Small (1-33%) None Present (0%) N/A Granulation A mount: Red N/A N/A Granulation Quality: Large (67-100%) Large (67-100%) N/A Necrotic A mount: Adherent Slough Eschar N/A Necrotic Tissue: Fat Layer (Subcutaneous Tissue): Yes Fascia: No N/A Exposed Structures: Fascia: No Fat Layer (Subcutaneous Tissue): No Tendon: No Tendon: No Muscle: No Muscle: No Joint: No Joint: No Bone: No Bone: No Medium (34-66%) Small (1-33%) N/A Epithelialization: Debridement - Excisional N/A N/A Debridement: Pre-procedure Verification/Time Out 10:54 N/A N/A Taken: Lidocaine 4% Topical Solution N/A N/A Pain Control: Subcutaneous, Slough N/A N/A Tissue Debrided: Skin/Subcutaneous Tissue N/A N/A Level: 11.76 N/A N/A Debridement A (sq cm): rea Curette N/A N/A Instrument: Minimum N/A N/A Bleeding: Pressure N/A N/A Hemostasis A chieved: Procedure was tolerated well N/A N/A Debridement Treatment Response: 4.9x2.4x0.1 N/A N/A Post Debridement Measurements L x W x D (cm) 0.924 N/A N/A Post Debridement Volume: (cm) Excoriation: No No Abnormalities Noted N/A Periwound Skin Texture: Induration: No Callus: No Crepitus: No Rash: No Scarring: No Dry/Scaly: No Dry/Scaly: Yes N/A Periwound Skin Moisture: Maceration: No Atrophie Blanche: No No Abnormalities Noted N/A Periwound Skin ColorAMADA, HALLISEY I ([id number]) [id number] Page 4 of 9 Cyanosis: No Ecchymosis: No Erythema: No Hemosiderin Staining: No Mottled: No Pallor: No Rubor: No No Abnormality N/A N/A Temperature: Biopsy N/A N/A Procedures  Performed: Compression Therapy Debridement Treatment Notes Electronic Signature(s) Signed: 01/01/2022 11:09:06 AM By: [name] MD FACS Entered By: [name] on 01/01/2022 11:09:05 -------------------------------------------------------------------------------- Multi-Disciplinary Care Plan Details Patient Name: Date of Service: [name] I. 01/01/2022 10:45 A M Medical Record Number: [id number] Patient Account Number: [id number] Date of Birth/Sex: Treating RN: 24-Jan-1945 (76 y.o. Harlow Ohms Primary Care Sasha Rueth: Domingo Mend Other Clinician: Referring Crystal Ellwood: Treating Kj Imbert/Extender: Caprice Kluver in Treatment: 14 Multidisciplinary Care Plan reviewed with physician Active Inactive Abuse / Safety / Falls / Self Care Management Nursing Diagnoses: History of Falls Potential for falls Goals: Patient will not experience any injury related to falls Date Initiated: 12/07/2021 Target Resolution Date: 02/01/2022 Goal Status: Active Patient/caregiver will verbalize/demonstrate measures taken to prevent injury and/or falls Date Initiated: 09/20/2021 Date Inactivated: 10/19/2021 Target Resolution Date: 10/18/2021 Goal Status: Met Interventions: Assess fall risk on admission and as needed Assess impairment of mobility on admission and as needed per policy Notes: Pressure Nursing Diagnoses: Knowledge deficit related to causes and  risk factors for pressure ulcer development Goals: Patient will remain free of pressure ulcers Date Initiated: 12/07/2021 Target Resolution Date: 02/01/2022 Goal Status: Active Interventions: Assess potential for pressure ulcer upon admission and as needed Notes: Electronic Signature(s) RHEAGAN, NAYAK I ([id number]) [id number] Page 5 of 9 Signed: 01/01/2022 4:17:15 PM By: Sabas Sous By: Adline Peals on 01/01/2022  10:53:28 -------------------------------------------------------------------------------- Pain Assessment Details Patient Name: Date of Service: SHADAI, MCCLANE I. 01/01/2022 10:45 A M Medical Record Number: [id number] Patient Account Number: [id number] Date of Birth/Sex: Treating RN: 1945-04-18 (76 y.o. Harlow Ohms Primary Care Ladona Rosten: Domingo Mend Other Clinician: Referring Job Holtsclaw: Treating Panayiotis Rainville/Extender: Gevena Cotton Weeks in Treatment: 14 Active Problems Location of Pain Severity and Description of Pain Patient Has Paino No Site Locations Rate the pain. Current Pain Level: 0 Pain Management and Medication Current Pain Management: Electronic Signature(s) Signed: 01/01/2022 4:17:15 PM By: Adline Peals Entered By: Adline Peals on 01/01/2022 10:37:50 -------------------------------------------------------------------------------- Patient/Caregiver Education Details Patient Name: Date of Service: [name] I. 12/26/2023andnbsp10:45 A M Medical Record Number: [id number] Patient Account Number: [id number] Date of Birth/Gender: Treating RN: May 26, 1945 (76 y.o. Harlow Ohms Primary Care Physician: Domingo Mend Other Clinician: Referring Physician: Treating Physician/Extender: Caprice Kluver in Treatment: 14 Education Assessment Education Provided To: Patient ISOLDE, SKAFF I ([id number]) [id number] Page 6 of 9 Education Topics Provided Wound Debridement: Methods: Explain/Verbal Responses: Reinforcements needed, State content correctly Electronic Signature(s) Signed: 01/01/2022 4:17:15 PM By: Adline Peals Entered By: Adline Peals on 01/01/2022 10:53:42 -------------------------------------------------------------------------------- Wound Assessment Details Patient Name: Date of Service: [name] I. 01/01/2022 10:45 A M Medical  Record Number: [id number] Patient Account Number: [id number] Date of Birth/Sex: Treating RN: [birth year] (76 y.o. Harlow Ohms Primary Care Merlene Dante: Domingo Mend Other Clinician: Referring Jennetta Flood: Treating Geovanna Simko/Extender: Gevena Cotton Weeks in Treatment: 14 Wound Status Wound Number: 2 Primary Open Surgical Wound Etiology: Wound Location: Right, Lateral Lower Leg Secondary Venous Leg Ulcer Wounding Event: Surgical Injury Etiology: Date Acquired: 08/17/2021 Wound Status: Open Weeks Of Treatment: 14 Comorbid Cataracts, Chronic sinus problems/congestion, Hypertension, Clustered Wound: Yes History: Crohns, Osteoarthritis Photos Wound Measurements Length: (cm) 4.9 Width: (cm) 2.4 Depth: (cm) 0.1 Area: (cm) 9.236 Volume: (cm) 0.924 % Reduction in Area: 49.1% % Reduction in Volume: 49.1% Epithelialization: Medium (34-66%) Tunneling: No Undermining: No Wound Description Classification: Full Thickness Without Exposed Suppor Wound Margin: Distinct, outline attached Exudate Amount: Large Exudate Type: Serous Exudate Color: amber t Structures Foul Odor After Cleansing: No Slough/Fibrino Yes Wound Bed Granulation Amount: Small (1-33%) Exposed Structure Granulation Quality: Red Fascia Exposed: No Necrotic Amount: Large (67-100%) Fat Layer (Subcutaneous Tissue) Exposed: Yes Necrotic Quality: Adherent Slough Tendon Exposed: No Muscle Exposed: No Joint Exposed: No Bone Exposed: No KENDRIA, HALBERG I ([id number]) [id number] Page 7 of 9 Periwound Skin Texture Texture Color No Abnormalities Noted: Yes No Abnormalities Noted: Yes Moisture Temperature / Pain No Abnormalities Noted: Yes Temperature: No Abnormality Treatment Notes Wound #2 (Lower Leg) Wound Laterality: Right, Lateral Cleanser Soap and Water Discharge Instruction: May shower and wash wound with dial antibacterial soap and water prior to dressing  change. Wound Cleanser Discharge Instruction: Cleanse the wound with wound cleanser prior to applying a clean dressing using gauze sponges, not tissue or cotton balls. Peri-Wound Care Sween Lotion (Moisturizing lotion) Discharge Instruction: Apply moisturizing lotion as directed Topical Primary Dressing Sorbalgon AG Dressing, 4x4 (in/in) Discharge Instruction: Apply to wound bed as instructed Secondary Dressing Zetuvit  Plus 4x8 in Discharge Instruction: Apply over primary dressing as directed. Secured With Transpore Surgical Tape, 2x10 (in/yd) Discharge Instruction: Secure dressing with tape as directed. Compression Wrap ThreePress (3 layer compression wrap) Discharge Instruction: Apply three layer compression as directed. Compression Stockings Add-Ons Electronic Signature(s) Signed: 01/01/2022 4:17:15 PM By: Adline Peals Entered By: Adline Peals on 01/01/2022 10:48:26 -------------------------------------------------------------------------------- Wound Assessment Details Patient Name: Date of Service: [name] I. 01/01/2022 10:45 A M Medical Record Number: [id number] Patient Account Number: [id number] Date of Birth/Sex: Treating RN: 1945-05-15 (76 y.o. Harlow Ohms Primary Care Ichiro Chesnut: Domingo Mend Other Clinician: Referring Nyana Haren: Treating Jayli Fogleman/Extender: Gevena Cotton Weeks in Treatment: 14 Wound Status Wound Number: 4 Primary Lesion Etiology: Wound Location: Left Knee Wound Open Wounding Event: Other Lesion Status: Date Acquired: 11/23/2021 Comorbid Cataracts, Chronic sinus problems/congestion, Hypertension, Weeks Of Treatment: 3 History: Crohns, Osteoarthritis Clustered Wound: No Photos SERINE, KEA I ([id number]) [id number] Page 8 of 9 Wound Measurements Length: (cm) 0.5 Width: (cm) 0.6 Depth: (cm) 0.1 Area: (cm) 0.236 Volume: (cm) 0.024 % Reduction in Area:  -87.3% % Reduction in Volume: -84.6% Epithelialization: Small (1-33%) Tunneling: No Undermining: No Wound Description Classification: Full Thickness Without Exposed Support Structures Wound Margin: Distinct, outline attached Exudate Amount: None Present Foul Odor After Cleansing: No Slough/Fibrino No Wound Bed Granulation Amount: None Present (0%) Exposed Structure Necrotic Amount: Large (67-100%) Fascia Exposed: No Necrotic Quality: Eschar Fat Layer (Subcutaneous Tissue) Exposed: No Tendon Exposed: No Muscle Exposed: No Joint Exposed: No Bone Exposed: No Periwound Skin Texture Texture Color No Abnormalities Noted: Yes No Abnormalities Noted: Yes Moisture No Abnormalities Noted: No Dry / Scaly: Yes Maceration: No Treatment Notes Wound #4 (Knee) Wound Laterality: Left Cleanser Soap and Water Discharge Instruction: May shower and wash wound with dial antibacterial soap and water prior to dressing change. Wound Cleanser Discharge Instruction: Cleanse the wound with wound cleanser prior to applying a clean dressing using gauze sponges, not tissue or cotton balls. Peri-Wound Care Sween Lotion (Moisturizing lotion) Discharge Instruction: Apply moisturizing lotion as directed Topical Primary Dressing Sorbalgon AG Dressing, 4x4 (in/in) Discharge Instruction: Apply to wound bed as instructed Secondary Dressing Zetuvit Plus Silicone Border Dressing 4x4 (in/in) Discharge Instruction: Apply silicone border over primary dressing as directed. Secured With Compression Wrap Compression Stockings Add-Ons LEXIE, KOEHL I ([id number]) [id number] Page 9 of 9 Electronic Signature(s) Signed: 01/01/2022 4:17:15 PM By: Sabas Sous By: Adline Peals on 01/01/2022 10:48:44 -------------------------------------------------------------------------------- Vitals Details Patient Name: Date of Service: [name] I. 01/01/2022 10:45 A  M Medical Record Number: [id number] Patient Account Number: [id number] Date of Birth/Sex: Treating RN: [birth year] (76 y.o. Harlow Ohms Primary Care Fulton Merry: Domingo Mend Other Clinician: Referring Trinidad Petron: Treating Karem Farha/Extender: Gevena Cotton Weeks in Treatment: 14 Vital Signs Time Taken: 10:37 Temperature (F): 98 Height (in): 60 Pulse (bpm): 84 Weight (lbs): 101 Respiratory Rate (breaths/min): 18 Body Mass Index (BMI): 19.7 Blood Pressure (mmHg): 149/81 Reference Range: 80 - 120 mg / dl Electronic Signature(s) Signed: 01/01/2022 4:17:15 PM By: Adline Peals Entered By: Adline Peals on 01/01/2022 10:38:39

## 2022-01-01 NOTE — Progress Notes (Signed)
ANDERSON, COPPOCK [name] ([id number]) [id number] Page 1 of 13 Visit Report for 01/01/2022 Biopsy Details Patient Name: Date of Service: [name], ALF 01/01/2022 10:45 A M Medical Record Number: [id number] Patient Account Number: [id number] Date of Birth/Sex: Treating RN: [birth year] (76 y.o. Harlow Ohms Primary Care Provider: Domingo Mend Other Clinician: Referring Provider: Treating Provider/Extender: Gevena Cotton Weeks in Treatment: 14 Biopsy Performed for: Wound #2 Right, Lateral Lower Leg Location(s): Wound Bed, Wound Margin Performed By: Physician [name], MD Tissue Punch: Yes Size (mm): 5 Number of Specimens T aken: 2 Specimen Sent T Pathology: o Yes Level of Consciousness (Pre-procedure): Awake and Alert Pre-procedure Verification/Time-Out Taken: Yes - 11:02 Pain Control: Lidocaine Injectable Lidocaine Percent: 1% Instrument: Forceps, Scissors Bleeding: Moderate Hemostasis Achieved: Silver Nitrate Procedural Pain: 0 Post Procedural Pain: 0 Response to Treatment: Procedure was tolerated well Level of Consciousness (Post-procedure): Awake and Alert Post Procedure Diagnosis Same as Pre-procedure Notes scribed for Dr. [name] by Adline Peals, RN Electronic Signature(s) Signed: 01/01/2022 11:15:23 AM By: [name] MD FACS Signed: 01/01/2022 4:17:15 PM By: Adline Peals Entered By: Adline Peals on 01/01/2022 11:04:26 -------------------------------------------------------------------------------- Chief Complaint Document Details Patient Name: Date of Service: [name] [name]. 01/01/2022 10:45 A M Medical Record Number: [id number] Patient Account Number: [id number] Date of Birth/Sex: Treating RN: May 20, 1945 (76 y.o. F) Primary Care Provider: Domingo Mend Other Clinician: Referring Provider: Treating Provider/Extender: Caprice Kluver in  Treatment: 14 Information Obtained from: Patient Chief Complaint 11/15/2020; patient is here for review of a wound area on the right posterior calf 09/20/2021: Patient is here for a nonhealing wound after undergoing Mohs surgery on her right lower leg. 12/07/2021: Reopening of skin at site of previous wound, new wound on right toe, concerning lesion on left upper leg just above knee [name] ([id number]) [id number] Page 2 of 13 Electronic Signature(s) Signed: 01/01/2022 11:09:17 AM By: [name] MD FACS Entered By: [name] on 01/01/2022 11:09:17 -------------------------------------------------------------------------------- Debridement Details Patient Name: Date of Service: [name] [name]. 01/01/2022 10:45 A M Medical Record Number: [id number] Patient Account Number: [id number] Date of Birth/Sex: Treating RN: August 06, 1945 (76 y.o. Harlow Ohms Primary Care Provider: Domingo Mend Other Clinician: Referring Provider: Treating Provider/Extender: Gevena Cotton Weeks in Treatment: 14 Debridement Performed for Assessment: Wound #2 Right,Lateral Lower Leg Performed By: Physician [name], MD Debridement Type: Debridement Severity of Tissue Pre Debridement: Fat layer exposed Level of Consciousness (Pre-procedure): Awake and Alert Pre-procedure Verification/Time Out Yes - 10:54 Taken: Start Time: 10:54 Pain Control: Lidocaine 4% T opical Solution T Area Debrided (L x W): otal 4.9 (cm) x 2.4 (cm) = 11.76 (cm) Tissue and other material debrided: Non-Viable, Slough, Subcutaneous, Slough Level: Skin/Subcutaneous Tissue Debridement Description: Excisional Instrument: Curette Bleeding: Minimum Hemostasis Achieved: Pressure Response to Treatment: Procedure was tolerated well Level of Consciousness (Post- Awake and Alert procedure): Post Debridement Measurements of Total Wound Length: (cm)  4.9 Width: (cm) 2.4 Depth: (cm) 0.1 Volume: (cm) 0.924 Character of Wound/Ulcer Post Debridement: Improved Severity of Tissue Post Debridement: Fat layer exposed Post Procedure Diagnosis Same as Pre-procedure Notes scribed for Dr. [name] by Adline Peals, RN Electronic Signature(s) Signed: 01/01/2022 11:15:23 AM By: [name] MD FACS Signed: 01/01/2022 4:17:15 PM By: Adline Peals Entered By: Adline Peals on 01/01/2022 11:00:20 -------------------------------------------------------------------------------- HPI Details Patient Name: Date of Service: [name] [name]. 01/01/2022 10:45 A M Medical Record Number: [id number] Patient Account Number: [id number]  Date of Birth/Sex: Treating RN: 1945/08/04 (76 y.o. F) Primary Care Provider: Domingo Mend Other Clinician: Referring Provider: Treating Provider/Extender: Caprice Kluver in Treatment: [address] [place] [name] ([id number]) [id number] Page 3 of 13 History of Present Illness HPI Description: ADMISSION 11/15/2020 This is a 76 year old woman with a history of squamous cell cancer skin cancer followed by Dr. Anabel Bene dermatology. She describes having a wound on the right posterior calf dating back since September. She saw her primary doctor in early October who suggested petroleum infused gauze. The patient has been doing this. No real improvement in fact she says this is getting larger. She does not complain much of pain. She is completely uncertain about how this happened there was no trauma she simply became aware of it at some point describing it is about the size of a quarter but it has been getting progressively larger since then. She is here for review of this. Past medical history Crohn's disease not currently on prednisone, gastroesophageal reflux disease, history of squamous cell skin cancer, history of rheumatoid arthritis, hypertension 11/16; this  lady had a large superficial atypical wound on the right posterior calf. She has significant solar skin damage probably some degree of venous insufficiency but she described a rapidly expanding wound area. Because of this and a history of skin cancer [name] went ahead and did a shave biopsy but we do not have the results of this at the time we saw her in clinic today. She has been using silver alginate and kerlix 11/30; shave biopsy [name] did last time did not show any malignancy or fungal elements. Suggestion of a stasis ulcer. With our treatment which is simply been silver alginate with kerlix that she is changing daily the wound is down dramatically almost surprising how rapidly it is reduced in surface area 12/14; patient comes in today with her wound healed this was on the right posterior calf. She has significant solar skin damage probably some degree of venous insufficiency however she described a rapidly expanding wound area on admission. Because of this and a history of skin cancer [name] did do a shave biopsy but nothing was really found of worry. 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 Unfortunately, the wound has failed to heal. There is thick slough on the wound surface. The periwound skin is in reasonably good condition. There is granulation tissue forming underneath the slough. 09/28/2021: The wound has deteriorated rather significantly over the past week. She had fairly copious drainage that she noticed as early as Monday, but she did not contact our office. Today there is a lot of periwound skin breakdown and a very pungent odor coming from the wound. There is thick slough on the wound surface but there is still good granulation tissue underlying  this. Despite this, however, the wound actually measures smaller. 10/05/2021: The culture that [name] took grew out MRSA. [name] prescribed doxycycline and she is still taking this. There has been a tremendous improvement in the wound. It is smaller and has contracted considerably. There is hypertrophic granulation tissue present. No malodorous drainage. 10/16/2021: The wound has contracted further and is epithelializing. The surface is very clean and the hypertrophic granulation tissue has not recurred. She completed her course of doxycycline.  10/19; surgical wound on the right medial lower extremity secondary to Mohs surgery done in August. She is using Hydrofera Blue under 3 layer compression and making excellent progress. 11/02/2021: The wound is nearly completely epithelialized. Just a couple of open areas with some eschar overlying the surface. 11/12/2021: Just 1 small open area remains. It is clean without any slough or eschar accumulation. No hypertrophic granulation tissue present. 11/19/2021: Her wound is healed. READMISSION 12/07/2021 The patient has been moving for the past couple of weeks and has spent a substantial amount of time on her feet. As result, she developed leg swelling and the tissue where her wound had been, which she was protecting with a Band-Aid, broke down considerably. She now has a large wound that is irregular with extensive slough, involving the fat layer. She also developed a blister on her toe that has slough and eschar present. She also pointed out lump just above her left knee that she is concerned about, as she does have a history of multiple skin cancers. 12/11; patient comes in the clinic with a large wound on the right anterior lower leg. This was initially a skin cancer underwent Mohs surgery and was treated in this clinic and healed out on 11/13. The patient states the wound reopened with edema and also trauma from a dressing she was putting on it. On the left  superior patella biopsy from last time showed squamous cell carcinoma. She also has a wound on the right second toe. The patient states quite adamantly she will not go back to her previous dermatologist for the skin surgery center in [place] blaming them for the original wound on her right lateral leg. [name] am not exactly sure of her rationale but at this point she certainly will not consider a referral there 12/18; the patient has an appointment at [facility] on 12/28 although [name] am not sure exactly who she is seeing. This is in reference to the skin cancer just above her left patella. She would not go back to the skin surgery or indeed her dermatologist in [place]. She had an area on the right second toe this is healed today. Her original surgical site on the right anterior lateral lower leg actually looks some better. [name] changed her to Iodoflex last week 01/01/2022: The patient has an appointment with plastic surgery on Thursday to evaluate the biopsy-proven squamous cell carcinoma on her left upper leg. The wound on her right lower leg has a thick layer of slough on the surface and has a very atypical appearance. Electronic Signature(s) Signed: 01/01/2022 11:10:57 AM By: [name] MD FACS Entered By: [name] on 01/01/2022 11:10:57 Ivey, Parks Ranger ([id number]) [id number] Page 4 of 13 -------------------------------------------------------------------------------- Physical Exam Details Patient Name: Date of Service: LAVERNA, DOSSETT 01/01/2022 10:45 A M Medical Record Number: [id number] Patient Account Number: [id number] Date of Birth/Sex: Treating RN: 05-23-1945 (76 y.o. F) Primary Care Provider: Domingo Mend Other Clinician: Referring Provider: Treating Provider/Extender: Gevena Cotton Weeks in Treatment: 14 Constitutional Slightly hypertensive. . . . No acute distress. Respiratory Normal work of breathing on room  air. Notes 01/01/2022: The wound on her right lower leg has a thick layer of slough on the surface and has a very atypical appearance. The biopsy site on her left upper leg has scabbed over. Electronic Signature(s) Signed: 01/01/2022 11:11:56 AM By: [name] MD FACS Entered By: [name] on 01/01/2022 11:11:56 -------------------------------------------------------------------------------- Physician Orders Details Patient Name: Date of Service: RO  CAROLAN, AVEDISIAN [name]. 01/01/2022 10:45 A M Medical Record Number: [id number] Patient Account Number: [id number] Date of Birth/Sex: Treating RN: Jul 25, 1945 (76 y.o. Harlow Ohms Primary Care Provider: Domingo Mend Other Clinician: Referring Provider: Treating Provider/Extender: Caprice Kluver in Treatment: [id number] Verbal / Phone Orders: No Diagnosis Coding ICD-10 Coding Code Description [phone] 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d L98.9 Disorder of the skin and subcutaneous tissue, unspecified I10 Essential (primary) hypertension K50.90 Crohn's disease, unspecified, without complications V89 Unspecified severe protein-calorie malnutrition T81.31XS Disruption of external operation (surgical) wound, not elsewhere classified, sequela C44.722 Squamous cell carcinoma of skin of right lower limb, including hip Follow-up Appointments ppointment in 1 week. - Dr. [name] Rm 1 Return A Nurse Visit: - room 2 12/29 at 10:00 AM Anesthetic Wound #4 Left Knee (In clinic) Topical Lidocaine 4% applied to wound bed Bathing/ Shower/ Hygiene May shower with protection but do not get wound dressing(s) wet. Protect dressing(s) with water repellant cover (for example, large plastic bag) or a cast cover and may then take shower. Edema Control - Lymphedema / SCD / SHARNESE, HEATH [name] ([id number]) [id number] Page 5 of 13 A void standing for  long periods of time. Moisturize legs daily. Wound Treatment Wound #2 - Lower Leg Wound Laterality: Right, Lateral Cleanser: Soap and Water 1 x Per Week/30 Days Discharge Instructions: May shower and wash wound with dial antibacterial soap and water prior to dressing change. Cleanser: Wound Cleanser 1 x Per Week/30 Days Discharge Instructions: Cleanse the wound with wound cleanser prior to applying a clean dressing using gauze sponges, not tissue or cotton balls. Peri-Wound Care: Sween Lotion (Moisturizing lotion) 1 x Per Week/30 Days Discharge Instructions: Apply moisturizing lotion as directed Prim Dressing: Sorbalgon AG Dressing, 4x4 (in/in) 1 x Per Week/30 Days ary Discharge Instructions: Apply to wound bed as instructed Secondary Dressing: Zetuvit Plus 4x8 in 1 x Per Week/30 Days Discharge Instructions: Apply over primary dressing as directed. Secured With: Transpore Surgical Tape, 2x10 (in/yd) 1 x Per Week/30 Days Discharge Instructions: Secure dressing with tape as directed. Compression Wrap: ThreePress (3 layer compression wrap) 1 x Per Week/30 Days Discharge Instructions: Apply three layer compression as directed. Wound #4 - Knee Wound Laterality: Left Cleanser: Soap and Water 1 x Per Day/30 Days Discharge Instructions: May shower and wash wound with dial antibacterial soap and water prior to dressing change. Cleanser: Wound Cleanser 1 x Per Day/30 Days Discharge Instructions: Cleanse the wound with wound cleanser prior to applying a clean dressing using gauze sponges, not tissue or cotton balls. Peri-Wound Care: Sween Lotion (Moisturizing lotion) 1 x Per Day/30 Days Discharge Instructions: Apply moisturizing lotion as directed Prim Dressing: Sorbalgon AG Dressing, 4x4 (in/in) (Generic) 1 x Per Day/30 Days ary Discharge Instructions: Apply to wound bed as instructed Secondary Dressing: Zetuvit Plus Silicone Border Dressing 4x4 (in/in) (Generic) 1 x Per Day/30 Days Discharge  Instructions: Apply silicone border over primary dressing as directed. Laboratory Bacteria identified in Tissue by Biopsy culture (MICRO) - biopsy of nonhealing wound to right lower leg where a previous squamous cell carcinoma was - (ICD10 [phone] - 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d) LOINC Code: [phone] Convenience Name: Biopsy specimen culture Patient Medications llergies: Demerol, latex, aspirin, ciprofloxacin, codeine, Septra, lactose, adhesive tape, fentanyl A Notifications Medication Indication Start End 01/01/2022 lidocaine DOSE topical 4 % cream - cream topical Electronic Signature(s) Signed: 01/01/2022 11:15:58 AM  By: [name] MD FACS Signed: 01/01/2022 4:17:15 PM By: Adline Peals Previous Signature: 01/01/2022 11:15:23 AM Version By: [name] MD FACS Entered By: Adline Peals on 01/01/2022 11:15:20 Pates, Parks Ranger ([id number]) [id number] Page 6 of 13 Prescription 01/01/2022 -------------------------------------------------------------------------------- Cambria, Janann [name]. [name] MD Patient Name: Provider: May 30, 1945 [id number] Date of Birth: NPI#: [id number] Sex: DEA #: [phone] Phone #: License #: [place] Patient Address: [address], [place] [address] [place], [place] [address] [phone] Allergies Demerol; latex; aspirin; ciprofloxacin; codeine; Septra; lactose; adhesive tape; fentanyl Provider's Orders Bacteria identified in Tissue by Biopsy culture - ICD10: L97.812 - biopsy of nonhealing wound to right lower leg where a previous squamous cell carcinoma was LOINC Code: [id number] Convenience Name: Biopsy specimen culture Hand Signature: Date(s): Electronic Signature(s) Signed: 01/01/2022 11:15:58 AM By: [name] MD FACS Signed: 01/01/2022 4:17:15 PM By:  Adline Peals Entered By: Adline Peals on 01/01/2022 11:15:21 -------------------------------------------------------------------------------- Problem List Details Patient Name: Date of Service: [name] [name]. 01/01/2022 10:45 A M Medical Record Number: [id number] Patient Account Number: [id number] Date of Birth/Sex: Treating RN: 1945/12/25 (76 y.o. F) Primary Care Provider: Domingo Mend Other Clinician: Referring Provider: Treating Provider/Extender: Gevena Cotton Weeks in Treatment: 14 Active Problems ICD-10 Encounter Code Description Active Date MDM Diagnosis L97.812 Non-pressure chronic ulcer of other part of right lower leg with fat layer 09/20/2021 No Yes exposed L98.9 Disorder of the skin and subcutaneous tissue, unspecified 12/07/2021 No Yes I10 Essential (primary) hypertension 09/20/2021 No Yes K50.90 Crohn's disease, unspecified, without complications 2/45/8099 No Yes E43 Unspecified severe protein-calorie malnutrition 09/20/2021 No Yes ZADAYA, CUADRA [name] ([id number]) [id number] Page 7 of 13 T81.31XS Disruption of external operation (surgical) wound, not elsewhere classified, 09/20/2021 No Yes sequela C44.722 Squamous cell carcinoma of skin of right lower limb, including hip 09/20/2021 No Yes Inactive Problems ICD-10 Code Description Active Date Inactive Date L97.512 Non-pressure chronic ulcer of other part of right foot with fat layer exposed 12/07/2021 12/07/2021 Resolved Problems Electronic Signature(s) Signed: 01/01/2022 11:08:56 AM By: [name] MD FACS Entered By: [name] on 01/01/2022 11:08:56 -------------------------------------------------------------------------------- Progress Note Details Patient Name: Date of Service: [name] [name]. 01/01/2022 10:45 A M Medical Record Number: [id number] Patient Account Number: [id number] Date of Birth/Sex: Treating RN: 1945-12-07 (76 y.o.  F) Primary Care Provider: Domingo Mend Other Clinician: Referring Provider: Treating Provider/Extender: Caprice Kluver in Treatment: 14 Subjective Chief Complaint Information obtained from Patient 11/15/2020; patient is here for review of a wound area on the right posterior calf 09/20/2021: Patient is here for a nonhealing wound after undergoing Mohs surgery on her right lower leg. 12/07/2021: Reopening of skin at site of previous wound, new wound on right toe, concerning lesion on left upper leg just above knee History of Present Illness (HPI) ADMISSION 11/15/2020 This is a 76 year old woman with a history of squamous cell cancer skin cancer followed by Dr. Anabel Bene dermatology. She describes having a wound on the right posterior calf dating back since September. She saw her primary doctor in early October who suggested petroleum infused gauze. The patient has been doing this. No real improvement in fact she says this is getting larger. She does not complain much of pain. She is completely uncertain about how this happened there was no trauma she simply became aware of it at some point describing it is about the size of a quarter  but it has been getting progressively larger since then. She is here for review of this. Past medical history Crohn's disease not currently on prednisone, gastroesophageal reflux disease, history of squamous cell skin cancer, history of rheumatoid arthritis, hypertension 11/16; this lady had a large superficial atypical wound on the right posterior calf. She has significant solar skin damage probably some degree of venous insufficiency but she described a rapidly expanding wound area. Because of this and a history of skin cancer [name] went ahead and did a shave biopsy but we do not have the results of this at the time we saw her in clinic today. She has been using silver alginate and kerlix 11/30; shave biopsy [name] did last time did not show  any malignancy or fungal elements. Suggestion of a stasis ulcer. With our treatment which is simply been silver alginate with kerlix that she is changing daily the wound is down dramatically almost surprising how rapidly it is reduced in surface area 12/14; patient comes in today with her wound healed this was on the right posterior calf. She has significant solar skin damage probably some degree of venous insufficiency however she described a rapidly expanding wound area on admission. Because of this and a history of skin cancer [name] did do a shave biopsy but nothing was really found of worry. 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 Unfortunately, the wound has failed to heal. There is thick slough on the wound surface. The periwound skin is in reasonably good condition. There is granulation tissue forming underneath the slough. 09/28/2021: The wound has deteriorated rather significantly over the past week. She had fairly copious drainage that she noticed as early as Monday, but she did not contact our office. Today there is a lot of periwound skin breakdown and a very pungent odor coming from the wound. There is thick slough on the wound surface but there is still good granulation tissue underlying this. Despite this, however, the wound actually measures smaller. TEISHA, TROWBRIDGE [name] ([id number]) [id number] Page 8 of 13 10/05/2021: The culture that [name] took grew out MRSA. [name] prescribed doxycycline and she is still taking this. There has been a tremendous improvement in the wound. It is smaller and has contracted considerably. There is hypertrophic granulation tissue present. No malodorous drainage. 10/16/2021: The wound has  contracted further and is epithelializing. The surface is very clean and the hypertrophic granulation tissue has not recurred. She completed her course of doxycycline. 10/19; surgical wound on the right medial lower extremity secondary to Mohs surgery done in August. She is using Hydrofera Blue under 3 layer compression and making excellent progress. 11/02/2021: The wound is nearly completely epithelialized. Just a couple of open areas with some eschar overlying the surface. 11/12/2021: Just 1 small open area remains. It is clean without any slough or eschar accumulation. No hypertrophic granulation tissue present. 11/19/2021: Her wound is healed. READMISSION 12/07/2021 The patient has been moving for the past couple of weeks and has spent a substantial amount of time on her feet. As result, she developed leg swelling and the tissue where her wound had been, which she was protecting with a Band-Aid, broke down considerably. She now has a large wound that is irregular with extensive slough, involving the fat layer.  She also developed a blister on her toe that has slough and eschar present. She also pointed out lump just above her left knee that she is concerned about, as she does have a history of multiple skin cancers. 12/11; patient comes in the clinic with a large wound on the right anterior lower leg. This was initially a skin cancer underwent Mohs surgery and was treated in this clinic and healed out on 11/13. The patient states the wound reopened with edema and also trauma from a dressing she was putting on it. 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 She also has a wound on the right second toe. The patient states quite adamantly she will not go back to her previous dermatologist for the skin surgery center in [place] blaming them for the original wound on her right lateral leg. [name] am not exactly sure of her rationale but at this point she certainly  will not consider a referral there 12/18; the patient has an appointment at [facility] on 12/28 although [name] am not sure exactly who she is seeing. This is in reference to the skin cancer just above her left patella. She would not go back to the skin surgery or indeed her dermatologist in [place]. She had an area on the right second toe this is healed today. Her original surgical site on the right anterior lateral lower leg actually looks some better. [name] changed her to Iodoflex last week 01/01/2022: The patient has an appointment with plastic surgery on Thursday to evaluate the biopsy-proven squamous cell carcinoma on her left upper leg. The wound on her right lower leg has a thick layer of slough on the surface and has a very atypical appearance. Patient History Information obtained from Patient. Family History Diabetes - Father, Heart Disease - Father, Hypertension - Father, No family history of Cancer, Hereditary Spherocytosis, Kidney Disease, Lung Disease, Seizures, Stroke, Thyroid Problems, Tuberculosis. Social History Former smoker - quit 30 years ago, Marital Status - Married, Alcohol Use - Never, Drug Use - No History, Caffeine Use - Rarely. Medical History Eyes Patient has history of Cataracts - Had cataract surgery Denies history of Glaucoma, Optic Neuritis Ear/Nose/Mouth/Throat Patient has history of Chronic sinus problems/congestion - Post Nasal drip Denies history of Middle ear problems Hematologic/Lymphatic Denies history of Anemia, Hemophilia, Human Immunodeficiency Virus, Lymphedema, Sickle Cell Disease Respiratory Denies history of Aspiration, Asthma, Chronic Obstructive Pulmonary Disease (COPD), Pneumothorax, Sleep Apnea, Tuberculosis Cardiovascular Patient has history of Hypertension Gastrointestinal Patient has history of Crohnoos - Has Chrons Disease Denies history of Cirrhosis , Colitis, Hepatitis A, Hepatitis B, Hepatitis C Endocrine Denies history of Type [name]  Diabetes, Type II Diabetes Genitourinary Denies history of End Stage Renal Disease Immunological Denies history of Lupus Erythematosus, Raynaudoos, Scleroderma Musculoskeletal Patient has history of Osteoarthritis - Knees Denies history of Gout, Rheumatoid Arthritis, Osteomyelitis Neurologic Denies history of Dementia, Neuropathy, Quadriplegia, Paraplegia, Seizure Disorder Oncologic Denies history of Received Chemotherapy, Received Radiation Psychiatric Denies history of Anorexia/bulimia, Confinement Anxiety Hospitalization/Surgery History - bil TKA. - Moh's procedure right leg 08/17/21. - bil cataract extractions. - nasal fx surgery. Medical A Surgical History Notes nd Gastrointestinal Has a large hiatal hernia. Has Chrons Disease., GERD, pancreatic insufficiency Endocrine Pancreatic Insufficiency DAJIAH, KOOI [name] ([id number]) [id number] Page 9 of 13 Genitourinary stage 3 Chronic Kidney disease Integumentary (Skin) Had Squamous Cell (skin cancer). Had a skin graft on Right knee (area) Musculoskeletal Had Bil knee replacements, hx cervical spine fx, sternal fx, thoracis spine  fx Neurologic left foot drop Oncologic Had Squamous Cell (skin cell) Objective Constitutional Slightly hypertensive. No acute distress. Vitals Time Taken: 10:37 AM, Height: 60 in, Weight: 101 lbs, BMI: 19.7, Temperature: 98 F, Pulse: 84 bpm, Respiratory Rate: 18 breaths/min, Blood Pressure: 149/81 mmHg. Respiratory Normal work of breathing on room air. General Notes: 01/01/2022: The wound on her right lower leg has a thick layer of slough on the surface and has a very atypical appearance. The biopsy site on her left upper leg has scabbed over. Integumentary (Hair, Skin) Wound #2 status is Open. Original cause of wound was Surgical Injury. The date acquired was: 08/17/2021. The wound has been in treatment 14 weeks. The wound is located on the Right,Lateral Lower Leg. The  wound measures 4.9cm length x 2.4cm width x 0.1cm depth; 9.236cm^2 area and 0.924cm^3 volume. There is Fat Layer (Subcutaneous Tissue) exposed. There is no tunneling or undermining noted. There is a large amount of serous drainage noted. The wound margin is distinct with the outline attached to the wound base. There is small (1-33%) red granulation within the wound bed. There is a large (67-100%) amount of necrotic tissue within the wound bed including Adherent Slough. The periwound skin appearance had no abnormalities noted for texture. The periwound skin appearance had no abnormalities noted for moisture. The periwound skin appearance had no abnormalities noted for color. Periwound temperature was noted as No Abnormality. Wound #4 status is Open. Original cause of wound was Other Lesion. The date acquired was: 11/23/2021. The wound has been in treatment 3 weeks. The wound is located on the Left Knee. The wound measures 0.5cm length x 0.6cm width x 0.1cm depth; 0.236cm^2 area and 0.024cm^3 volume. There is no tunneling or undermining noted. There is a none present amount of drainage noted. The wound margin is distinct with the outline attached to the wound base. There is no granulation within the wound bed. There is a large (67-100%) amount of necrotic tissue within the wound bed including Eschar. The periwound skin appearance had no abnormalities noted for texture. The periwound skin appearance had no abnormalities noted for color. The periwound skin appearance exhibited: Dry/Scaly. The periwound skin appearance did not exhibit: Maceration. Assessment Active Problems ICD-10 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d Disorder of the skin and subcutaneous tissue, unspecified Essential (primary) hypertension Crohn's disease, unspecified, without complications Unspecified severe protein-calorie malnutrition Disruption of external operation (surgical) wound,  not elsewhere classified, sequela Squamous cell carcinoma of skin of right lower limb, including hip Procedures Wound #2 Pre-procedure diagnosis of Wound #2 is an Open Surgical Wound located on the Right,Lateral Lower Leg .Severity of Tissue Pre Debridement is: Fat layer exposed. There was a Excisional Skin/Subcutaneous Tissue Debridement with a total area of 11.76 sq cm performed by [name], MD. With the following instrument(s): Curette to remove Non-Viable tissue/material. Material removed includes Subcutaneous Tissue and Slough and after achieving pain control using Lidocaine 4% T opical Solution. No specimens were taken. A time out was conducted at 10:54, prior to the start of the procedure. A Minimum amount of bleeding was controlled with Pressure. The procedure was tolerated well. Post Debridement Measurements: 4.9cm length x 2.4cm width x 0.1cm depth; 0.924cm^3 volume. Character of Wound/Ulcer Post Debridement is improved. Severity of Tissue Post Debridement is: Fat layer exposed. Post procedure Diagnosis Wound #2: Same as Pre-Procedure General Notes: scribed for Dr. [name] by Adline Peals, RN. Pre-procedure diagnosis of Wound #2 is an Open Surgical  Wound located on the Right, Lateral Lower Leg . There was a biopsy performed by [name], Haskell Flirt [name] ([id number]) [id number] Page 10 of 13 MD. There was a biopsy performed on Wound Bed, Wound Margin. The skin was cleansed and prepped with anti-septic followed by pain control using Lidocaine Injectable: 1%. Utilizing a 5 mm tissue punch, tissue was removed at its base with the following instrument(s): Forceps and Scissors and sent to pathology. A Moderate amount of bleeding was controlled with Silver Nitrate. A time out was conducted at 11:02, prior to the start of the procedure. The procedure was tolerated well with a pain level of 0 throughout and a pain level of 0 following the  procedure. Post procedure Diagnosis Wound #2: Same as Pre-Procedure General Notes: scribed for Dr. [name] by Adline Peals, RN. Pre-procedure diagnosis of Wound #2 is an Open Surgical Wound located on the Right,Lateral Lower Leg . There was a Three Layer Compression Therapy Procedure by Adline Peals, RN. Post procedure Diagnosis Wound #2: Same as Pre-Procedure Plan Follow-up Appointments: Return Appointment in 1 week. - Dr. Alvera Singh 1 Nurse Visit: - room 2 12/29 at 10:00 AM Anesthetic: Wound #4 Left Knee: (In clinic) Topical Lidocaine 4% applied to wound bed Bathing/ Shower/ Hygiene: May shower with protection but do not get wound dressing(s) wet. Protect dressing(s) with water repellant cover (for example, large plastic bag) or a cast cover and may then take shower. Edema Control - Lymphedema / SCD / Other: Avoid standing for long periods of time. Moisturize legs daily. The following medication(s) was prescribed: lidocaine topical 4 % cream cream topical was prescribed at facility WOUND #2: - Lower Leg Wound Laterality: Right, Lateral Cleanser: Soap and Water 1 x Per Week/30 Days Discharge Instructions: May shower and wash wound with dial antibacterial soap and water prior to dressing change. Cleanser: Wound Cleanser 1 x Per Week/30 Days Discharge Instructions: Cleanse the wound with wound cleanser prior to applying a clean dressing using gauze sponges, not tissue or cotton balls. Peri-Wound Care: Sween Lotion (Moisturizing lotion) 1 x Per Week/30 Days Discharge Instructions: Apply moisturizing lotion as directed Prim Dressing: Sorbalgon AG Dressing, 4x4 (in/in) 1 x Per Week/30 Days ary Discharge Instructions: Apply to wound bed as instructed Secondary Dressing: Zetuvit Plus 4x8 in 1 x Per Week/30 Days Discharge Instructions: Apply over primary dressing as directed. Secured With: Transpore Surgical T ape, 2x10 (in/yd) 1 x Per Week/30 Days Discharge Instructions: Secure  dressing with tape as directed. Com pression Wrap: ThreePress (3 layer compression wrap) 1 x Per Week/30 Days Discharge Instructions: Apply three layer compression as directed. WOUND #4: - Knee Wound Laterality: Left Cleanser: Soap and Water 1 x Per Day/30 Days Discharge Instructions: May shower and wash wound with dial antibacterial soap and water prior to dressing change. Cleanser: Wound Cleanser 1 x Per Day/30 Days Discharge Instructions: Cleanse the wound with wound cleanser prior to applying a clean dressing using gauze sponges, not tissue or cotton balls. Peri-Wound Care: Sween Lotion (Moisturizing lotion) 1 x Per Day/30 Days Discharge Instructions: Apply moisturizing lotion as directed Prim Dressing: Sorbalgon AG Dressing, 4x4 (in/in) (Generic) 1 x Per Day/30 Days ary Discharge Instructions: Apply to wound bed as instructed Secondary Dressing: Zetuvit Plus Silicone Border Dressing 4x4 (in/in) (Generic) 1 x Per Day/30 Days Discharge Instructions: Apply silicone border over primary dressing as directed. 01/01/2022: The wound on her right lower leg has a thick layer of slough on the surface and has a very atypical appearance. The biopsy  site on her left upper leg has scabbed over. [name] used a curette to debride slough and nonviable subcutaneous tissue from the wound on her right lower leg. Based upon its unusual appearance, [name] elected to perform punch biopsies in both the center and the margin of the wound as [name] am concerned she may have recurrence of her squamous cell carcinoma in this location. [name] encouraged her to have the plastic surgeon that she is seeing on Thursday also evaluate this location even though we want to have pathology results by that time. She will have a nurse visit on Friday to have her leg rewrapped. We will use silver alginate and 3 layer compression on the right leg and a foam border dressing on the left. [name] will see her in 1 week's time. Electronic Signature(s) Signed:  01/01/2022 11:13:26 AM By: [name] MD FACS Entered By: [name] on 01/01/2022 11:13:26 HxROS Details -------------------------------------------------------------------------------- Haskell Flirt [name] ([id number]) [id number] Page 11 of 13 Patient Name: Date of Service: [name], [name] 01/01/2022 10:45 A M Medical Record Number: [id number] Patient Account Number: [id number] Date of Birth/Sex: Treating RN: 1946-01-05 (76 y.o. F) Primary Care Provider: Domingo Mend Other Clinician: Referring Provider: Treating Provider/Extender: Caprice Kluver in Treatment: 14 Information Obtained From Patient Eyes Medical History: Positive for: Cataracts - Had cataract surgery Negative for: Glaucoma; Optic Neuritis Ear/Nose/Mouth/Throat Medical History: Positive for: Chronic sinus problems/congestion - Post Nasal drip Negative for: Middle ear problems Hematologic/Lymphatic Medical History: Negative for: Anemia; Hemophilia; Human Immunodeficiency Virus; Lymphedema; Sickle Cell Disease Respiratory Medical History: Negative for: Aspiration; Asthma; Chronic Obstructive Pulmonary Disease (COPD); Pneumothorax; Sleep Apnea; Tuberculosis Cardiovascular Medical History: Positive for: Hypertension Gastrointestinal Medical History: Positive for: Crohns - Has Chrons Disease Negative for: Cirrhosis ; Colitis; Hepatitis A; Hepatitis B; Hepatitis C Past Medical History Notes: Has a large hiatal hernia. Has Chrons Disease., GERD, pancreatic insufficiency Endocrine Medical History: Negative for: Type [name] Diabetes; Type II Diabetes Past Medical History Notes: Pancreatic Insufficiency Genitourinary Medical History: Negative for: End Stage Renal Disease Past Medical History Notes: stage 3 Chronic Kidney disease Immunological Medical History: Negative for: Lupus Erythematosus; Raynauds; Scleroderma Integumentary (Skin) Medical  History: Past Medical History Notes: Had Squamous Cell (skin cancer). Had a skin graft on Right knee (area) Musculoskeletal Medical History: Positive for: Osteoarthritis - Knees Negative for: Gout; Rheumatoid Arthritis; Osteomyelitis Past Medical History Notes: Had Bil knee replacements, hx cervical spine fx, sternal fx, thoracis spine fx KATERIA, CUTRONA [name] ([id number]) [id number] Page 12 of 13 Neurologic Medical History: Negative for: Dementia; Neuropathy; Quadriplegia; Paraplegia; Seizure Disorder Past Medical History Notes: left foot drop Oncologic Medical History: Negative for: Received Chemotherapy; Received Radiation Past Medical History Notes: Had Squamous Cell (skin cell) Psychiatric Medical History: Negative for: Anorexia/bulimia; Confinement Anxiety HBO Extended History Items Ear/Nose/Mouth/Throat: Eyes: Chronic sinus Cataracts problems/congestion Immunizations Pneumococcal Vaccine: Received Pneumococcal Vaccination: Yes Received Pneumococcal Vaccination On or After 60th Birthday: Yes Tetanus Vaccine: Last tetanus shot: 11/16/2011 Implantable Devices None Hospitalization / Surgery History Type of Hospitalization/Surgery bil TKA Moh's procedure right leg 08/17/21 bil cataract extractions nasal fx surgery Family and Social History Cancer: No; Diabetes: Yes - Father; Heart Disease: Yes - Father; Hereditary Spherocytosis: No; Hypertension: Yes - Father; Kidney Disease: No; Lung Disease: No; Seizures: No; Stroke: No; Thyroid Problems: No; Tuberculosis: No; Former smoker - quit 30 years ago; Marital Status - Married; Alcohol Use: Never; Drug Use: No History; Caffeine Use: Rarely; Financial Concerns: No; Food, Clothing or Shelter Needs: No; Support  System Lacking: No; Transportation Concerns: No Electronic Signature(s) Signed: 01/01/2022 11:15:23 AM By: [name] MD FACS Entered By: [name] on 01/01/2022  11:11:04 -------------------------------------------------------------------------------- SuperBill Details Patient Name: Date of Service: [name] [name]. 01/01/2022 Medical Record Number: [id number] Patient Account Number: [id number] Date of Birth/Sex: Treating RN: [birth year] (76 y.o. F) Primary Care Provider: Domingo Mend Other Clinician: Referring Provider: Treating Provider/Extender: Gevena Cotton Weeks in Treatment: 14 Diagnosis Coding ICD-10 Codes Code Description [phone] 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d T81.31XS Disruption of external operation (surgical) wound, not elsewhere classified, sequela TARNISHA, KACHMAR [name] ([id number]) [id number] Page 13 of 13 L98.9 Disorder of the skin and subcutaneous tissue, unspecified C44.722 Squamous cell carcinoma of skin of right lower limb, including hip I10 Essential (primary) hypertension K50.90 Crohn's disease, unspecified, without complications N81 Unspecified severe protein-calorie malnutrition Facility Procedures : CPT4 Code: [id number] Description: 03833 - DEB SUBQ TISSUE 20 SQ CM/< ICD-10 Diagnosis Description X83.291 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d T81.31XS Disruption of external operation (surgical) wound, not elsewhere classified,  sequela Modifier: Quantity: 1 : CPT4 Code: [id number] Description: 11104-Punch biopsy of skin (including simple closure, when performed) single lesion ICD-10 Diagnosis Description L97.812 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d C44.722 Squamous cell carcinoma of  skin of right lower limb, including hip Modifier: Quantity: 1 Physician Procedures : CPT4 Code Description Modifier [id number] - WC PHYS LEVEL 3 - EST PT 25 ICD-10 Diagnosis Description L97.812 Non-pressure chronic ulcer of other part of right  lower leg with fat layer exposed T81.31XS Disruption of external operation (surgical)  wound, not elsewhere classified, sequela L98.9 Disorder of the skin and subcutaneous tissue, unspecified C44.722 Squamous cell carcinoma of skin of right lower limb, including hip Quantity: 1 : [id number] 11042 - WC PHYS SUBQ TISS 20 SQ CM ICD-10 Diagnosis Description L97.812 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d T81.31XS Disruption of external operation (surgical) wound, not elsewhere classified,  sequela Quantity: 1 : 11104 Punch biopsy of skin (including simple closure, when performed) single lesion ICD-10 Diagnosis Description L97.812 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d C44.722 Squamous cell carcinoma of skin of right  lower limb, including hip Quantity: 1 Electronic Signature(s) Signed: 01/01/2022 11:14:05 AM By: [name] MD FACS Entered By: [name] on 01/01/2022 11:14:05

## 2022-01-04 ENCOUNTER — Encounter (HOSPITAL_BASED_OUTPATIENT_CLINIC_OR_DEPARTMENT_OTHER): Payer: Medicare Other | Admitting: General Surgery

## 2022-01-04 DIAGNOSIS — L97812 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d: Secondary | ICD-10-CM | POA: Diagnosis not present

## 2022-01-04 NOTE — Progress Notes (Signed)
[name], [name] ([id number]) [id number] Page 1 of 1 Visit Report for 01/04/2022 SuperBill Details Patient Name: Date of Service: [name], [name] 01/04/2022 Medical Record Number: [id number] Patient Account Number: [id number] Date of Birth/Sex: Treating RN: 1945-07-13 (76 y.o. Harlow Ohms Primary Care Provider: Domingo Mend Other Clinician: Referring Provider: Treating Provider/Extender: Gevena Cotton Weeks in Treatment: 15 Diagnosis Coding ICD-10 Codes Code Description [phone] 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d T81.31XS Disruption of external operation (surgical) wound, not elsewhere classified, sequela L98.9 Disorder of the skin and subcutaneous tissue, unspecified C44.722 Squamous cell carcinoma of skin of right lower limb, including hip I10 Essential (primary) hypertension K50.90 Crohn's disease, unspecified, without complications X83 Unspecified severe protein-calorie malnutrition Facility Procedures CPT4 Code Description Modifier Quantity [phone] (Facility Use Only) [phone] - APPLY MULTLAY COMPRS LWR RT LEG 1 ICD-10 Diagnosis Description L97.812 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d Electronic Signature(s) Signed: 01/04/2022 10:54:20 AM By: [name] MD FACS Signed: 01/04/2022 11:59:46 AM By: Adline Peals Entered By: Adline Peals on 01/04/2022 10:26:06

## 2022-01-04 NOTE — Progress Notes (Signed)
[name], [name] ([id number]) [id number] Page 1 of 5 Visit Report for 01/04/2022 Arrival Information Details Patient Name: Date of Service: [name], [name]. 01/04/2022 10:00 A M Medical Record Number: [id number] Patient Account Number: [id number] Date of Birth/Sex: Treating RN: [birth year] (76 y.o. F) Primary Care Jamita Mckelvin: Domingo Mend Other Clinician: Referring Trayvion Embleton: Treating Bowen Kia/Extender: Caprice Kluver in Treatment: 15 Visit Information History Since Last Visit All ordered tests and consults were completed: No Patient Arrived: Ambulatory Added or deleted any medications: No Arrival Time: 10:06 Any new allergies or adverse reactions: No Accompanied By: spouse Had a fall or experienced change in No Transfer Assistance: None activities of daily living that may affect Patient Identification Verified: Yes risk of falls: Secondary Verification Process Completed: Yes Signs or symptoms of abuse/neglect since last visito No Patient Requires Transmission-Based Precautions: No Hospitalized since last visit: No Patient Has Alerts: No Implantable device outside of the clinic excluding No cellular tissue based products placed in the center since last visit: Pain Present Now: No Electronic Signature(s) Signed: 01/04/2022 10:19:28 AM By: Worthy Rancher Entered By: Worthy Rancher on 01/04/2022 10:07:12 -------------------------------------------------------------------------------- Compression Therapy Details Patient Name: Date of Service: [name]. 01/04/2022 10:00 [place] Record Number: [id number] Patient Account Number: [id number] Date of Birth/Sex: Treating RN: [birth year] (76 y.o. Harlow Ohms Primary Care Tocara Mennen: Domingo Mend Other Clinician: Referring Novie Maggio: Treating Jenniffer Vessels/Extender: Gevena Cotton Weeks in Treatment: 15 Compression Therapy Performed for Wound  Assessment: Wound #2 Right,Lateral Lower Leg Performed By: Clinician Adline Peals, RN Compression Type: Three Layer Electronic Signature(s) Signed: 01/04/2022 11:59:46 AM By: Adline Peals Entered By: Adline Peals on 01/04/2022 10:25:28 -------------------------------------------------------------------------------- Encounter Discharge Information Details Patient Name: Date of Service: [name]. 01/04/2022 10:00 A M Medical Record Number: [id number] Patient Account Number: [id number] Date of Birth/Sex: Treating RN: [birth year] (76 y.o. [address]Adline Peals [place], [place] [name] ([id number]) [id number] Page 2 of 5 Primary Care Ercelle Winkles: Domingo Mend Other Clinician: Referring Henny Strauch: Treating Oluwatosin Higginson/Extender: Caprice Kluver in Treatment: 15 Encounter Discharge Information Items Discharge Condition: Stable Ambulatory Status: Ambulatory Discharge Destination: Home Transportation: Private Auto Accompanied By: self Schedule Follow-up Appointment: Yes Clinical Summary of Care: Patient Declined Electronic Signature(s) Signed: 01/04/2022 11:59:46 AM By: Sabas Sous By: Adline Peals on 01/04/2022 10:25:54 -------------------------------------------------------------------------------- Patient/Caregiver Education Details Patient Name: Date of Service: [name]. 12/29/2023andnbsp10:00 [place] Record Number: [id number] Patient Account Number: [id number] Date of Birth/Gender: Treating RN: November 29, 1945 (76 y.o. Harlow Ohms Primary Care Physician: Domingo Mend Other Clinician: Referring Physician: Treating Physician/Extender: Caprice Kluver in Treatment: 15 Education Assessment Education Provided To: Patient Education Topics Provided Safety: Methods: Explain/Verbal Responses: Reinforcements needed, State content  correctly Electronic Signature(s) Signed: 01/04/2022 11:59:46 AM By: Adline Peals Entered By: Adline Peals on 01/04/2022 10:25:45 -------------------------------------------------------------------------------- Wound Assessment Details Patient Name: Date of Service: [name]. 01/04/2022 10:00 A M Medical Record Number: [id number] Patient Account Number: [id number] Date of Birth/Sex: Treating RN: 13-Oct-1945 (76 y.o. Harlow Ohms Primary Care Evani Shrider: Domingo Mend Other Clinician: Referring Joyice Magda: Treating Mervyn Pflaum/Extender: Gevena Cotton Weeks in Treatment: 15 Wound Status Wound Number: 2 Primary Open Surgical Wound Etiology: Wound Location: Right, Lateral Lower Leg Secondary Venous Leg Ulcer Wounding Event: Surgical Injury Etiology: Date Acquired: 08/17/2021 Wound Open Weeks Of Treatment: [address] [name] ([id number]) [id number] Page 3 of 5 Weeks Of  Treatment: 15 Status: Clustered Wound: Yes Comorbid Cataracts, Chronic sinus problems/congestion, Hypertension, History: Crohns, Osteoarthritis Wound Measurements Length: (cm) 4.9 Width: (cm) 2.4 Depth: (cm) 0.1 Area: (cm) 9.236 Volume: (cm) 0.924 % Reduction in Area: 49.1% % Reduction in Volume: 49.1% Epithelialization: Medium (34-66%) Wound Description Classification: Full Thickness Without Exposed Support Structures Wound Margin: Distinct, outline attached Exudate Amount: Large Exudate Type: Serous Exudate Color: amber Foul Odor After Cleansing: No Slough/Fibrino Yes Wound Bed Granulation Amount: Small (1-33%) Exposed Structure Granulation Quality: Red Fascia Exposed: No Necrotic Amount: Large (67-100%) Fat Layer (Subcutaneous Tissue) Exposed: Yes Necrotic Quality: Adherent Slough Tendon Exposed: No Muscle Exposed: No Joint Exposed: No Bone Exposed: No Periwound Skin Texture Texture Color No Abnormalities Noted: Yes No  Abnormalities Noted: Yes Moisture Temperature / Pain No Abnormalities Noted: Yes Temperature: No Abnormality Treatment Notes Wound #2 (Lower Leg) Wound Laterality: Right, Lateral Cleanser Soap and Water Discharge Instruction: May shower and wash wound with dial antibacterial soap and water prior to dressing change. Wound Cleanser Discharge Instruction: Cleanse the wound with wound cleanser prior to applying a clean dressing using gauze sponges, not tissue or cotton balls. Peri-Wound Care Sween Lotion (Moisturizing lotion) Discharge Instruction: Apply moisturizing lotion as directed Topical Primary Dressing Sorbalgon AG Dressing, 4x4 (in/in) Discharge Instruction: Apply to wound bed as instructed Secondary Dressing Zetuvit Plus 4x8 in Discharge Instruction: Apply over primary dressing as directed. Secured With Transpore Surgical Tape, 2x10 (in/yd) Discharge Instruction: Secure dressing with tape as directed. Compression Wrap ThreePress (3 layer compression wrap) Discharge Instruction: Apply three layer compression as directed. Compression Stockings Add-Ons Electronic Signature(s) Signed: 01/04/2022 11:59:46 AM By: Adline Peals Entered By: Adline Peals on 01/04/2022 10:08:25 Norva Riffle ([id number]) [id number] Page 4 of 5 -------------------------------------------------------------------------------- Wound Assessment Details Patient Name: Date of Service: [name], [name]. 01/04/2022 10:00 A M Medical Record Number: [id number] Patient Account Number: [id number] Date of Birth/Sex: Treating RN: [birth year] (76 y.o. Harlow Ohms Primary Care Josephine Rudnick: Domingo Mend Other Clinician: Referring Gurnie Duris: Treating Nikolaos Maddocks/Extender: Gevena Cotton Weeks in Treatment: 15 Wound Status Wound Number: 4 Primary Lesion Etiology: Wound Location: Left Knee Wound Open Wounding Event: Other  Lesion Status: Date Acquired: 11/23/2021 Comorbid Cataracts, Chronic sinus problems/congestion, Hypertension, Weeks Of Treatment: 4 History: Crohns, Osteoarthritis Clustered Wound: No Wound Measurements Length: (cm) 0.5 Width: (cm) 0.6 Depth: (cm) 0.1 Area: (cm) 0.236 Volume: (cm) 0.024 % Reduction in Area: -87.3% % Reduction in Volume: -84.6% Epithelialization: Small (1-33%) Wound Description Classification: Full Thickness Without Exposed Support Structures Wound Margin: Distinct, outline attached Exudate Amount: None Present Foul Odor After Cleansing: No Slough/Fibrino No Wound Bed Granulation Amount: None Present (0%) Exposed Structure Necrotic Amount: Large (67-100%) Fascia Exposed: No Necrotic Quality: Eschar Fat Layer (Subcutaneous Tissue) Exposed: No Tendon Exposed: No Muscle Exposed: No Joint Exposed: No Bone Exposed: No Periwound Skin Texture Texture Color No Abnormalities Noted: Yes No Abnormalities Noted: Yes Moisture No Abnormalities Noted: No Dry / Scaly: Yes Maceration: No Treatment Notes Wound #4 (Knee) Wound Laterality: Left Cleanser Soap and Water Discharge Instruction: May shower and wash wound with dial antibacterial soap and water prior to dressing change. Wound Cleanser Discharge Instruction: Cleanse the wound with wound cleanser prior to applying a clean dressing using gauze sponges, not tissue or cotton balls. Peri-Wound Care Sween Lotion (Moisturizing lotion) Discharge Instruction: Apply moisturizing lotion as directed Topical Primary [place], 4x4 (in/in) [name], [name] ([id number]) [id number] Page 5 of 5 Discharge Instruction: Apply to wound bed as instructed  Secondary Dressing Zetuvit Plus Silicone Border Dressing 4x4 (in/in) Discharge Instruction: Apply silicone border over primary dressing as directed. Secured With Compression Wrap Compression Stockings Sport and exercise psychologist) Signed: 01/04/2022 11:59:46 AM By: Adline Peals Entered By: Adline Peals on 01/04/2022 10:08:30 -------------------------------------------------------------------------------- Vitals Details Patient Name: Date of Service: [name]. 01/04/2022 10:00 A M Medical Record Number: [id number] Patient Account Number: [id number] Date of Birth/Sex: Treating RN: 07-Jun-1945 (76 y.o. F) Primary Care Antero Derosia: Domingo Mend Other Clinician: Referring Shrinika Blatz: Treating Calyn Rubi/Extender: Gevena Cotton Weeks in Treatment: 15 Vital Signs Time Taken: 10:07 Temperature (F): 97.9 Height (in): 60 Pulse (bpm): 76 Weight (lbs): 101 Respiratory Rate (breaths/min): 20 Body Mass Index (BMI): 19.7 Blood Pressure (mmHg): 163/88 Reference Range: 80 - 120 mg / dl Electronic Signature(s) Signed: 01/04/2022 10:19:28 AM By: Worthy Rancher Entered By: Worthy Rancher on 01/04/2022 10:07:34

## 2022-01-05 ENCOUNTER — Other Ambulatory Visit: Payer: Self-pay | Admitting: Internal Medicine

## 2022-01-05 DIAGNOSIS — R609 Edema, unspecified: Secondary | ICD-10-CM

## 2022-01-07 ENCOUNTER — Other Ambulatory Visit: Payer: Self-pay | Admitting: Internal Medicine

## 2022-01-08 ENCOUNTER — Encounter (HOSPITAL_BASED_OUTPATIENT_CLINIC_OR_DEPARTMENT_OTHER): Payer: Medicare Other | Attending: General Surgery | Admitting: General Surgery

## 2022-01-08 DIAGNOSIS — E43 Unspecified severe protein-calorie malnutrition: Secondary | ICD-10-CM | POA: Diagnosis not present

## 2022-01-08 DIAGNOSIS — L97812 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d: Secondary | ICD-10-CM | POA: Insufficient documentation

## 2022-01-08 DIAGNOSIS — K5090 Crohn's disease, unspecified, without complications: Secondary | ICD-10-CM | POA: Insufficient documentation

## 2022-01-08 DIAGNOSIS — C44722 Squamous cell carcinoma of skin of right lower limb, including hip: Secondary | ICD-10-CM | POA: Insufficient documentation

## 2022-01-08 DIAGNOSIS — I872 Venous insufficiency (chronic) (peripheral): Secondary | ICD-10-CM | POA: Diagnosis not present

## 2022-01-08 DIAGNOSIS — I10 Essential (primary) hypertension: Secondary | ICD-10-CM | POA: Insufficient documentation

## 2022-01-08 DIAGNOSIS — M170 Bilateral primary osteoarthritis of knee: Secondary | ICD-10-CM | POA: Insufficient documentation

## 2022-01-08 NOTE — Progress Notes (Signed)
ADMIRE, BUNNELL [name]) [id number] Page 1 of 8 Visit Report for 01/08/2022 Arrival Information Details Patient Name: Date of Service: [name], April 01/08/2022 10:45 A M Medical Record Number: [id number] Patient Account Number: [id number] Date of Birth/Sex: Treating RN: 1945/01/24 (77 y.o. F) Primary Care [name]: [name] Other Clinician: Referring [name]: Treating [name]/Extender: [name] in Treatment: 15 Visit Information History Since Last Visit All ordered tests and consults were completed: No Patient Arrived: Ambulatory Added or deleted any medications: No Arrival Time: 10:40 Any new allergies or adverse reactions: No Accompanied By: self Had a fall or experienced change in No Transfer Assistance: None activities of daily living that may affect Patient Identification Verified: Yes risk of falls: Secondary Verification Process Completed: Yes Signs or symptoms of abuse/neglect since last visito No Patient Requires Transmission-Based Precautions: No Hospitalized since last visit: No Patient Has Alerts: No Implantable device outside of the clinic excluding No cellular tissue based products placed in the center since last visit: Pain Present Now: No Electronic Signature(s) Signed: 01/08/2022 1:42:20 PM By: [name] Previous Signature: 01/08/2022 11:03:58 AM Version By: [name] Entered By: [name] on 01/08/2022 11:04:45 -------------------------------------------------------------------------------- Compression Therapy Details Patient Name: Date of Service: [name]. 01/08/2022 10:45 A M Medical Record Number: [id number] Patient Account Number: [id number] Date of Birth/Sex: Treating RN: [birth year] (77 y.o. [name] Primary Care [name]: [name] Other Clinician: Referring Mauri Temkin: Treating Lizann Edelman/Extender: [name] in  Treatment: 15 Compression Therapy Performed for Wound Assessment: Wound #2 Right,Lateral Lower Leg Performed By: Clinician April East, RN Compression Type: Three Layer Post Procedure Diagnosis Same as Pre-procedure Electronic Signature(s) Signed: 01/08/2022 4:19:49 PM By: April East RN Entered By: [name] on 01/08/2022 11:01:08 [name], [name] ([id number]) [id number] Page 2 of 8 -------------------------------------------------------------------------------- Encounter Discharge Information Details Patient Name: Date of Service: [name] 01/08/2022 10:45 A M Medical Record Number: [id number] Patient Account Number: [id number] Date of Birth/Sex: Treating RN: 1945-08-11 (77 y.o. [name] Primary Care [name]: [name] Other Clinician: Referring Anelia Carriveau: Treating Simora Dingee/Extender: [name] in Treatment: 15 Encounter Discharge Information Items Post Procedure Vitals Discharge Condition: Stable Temperature (F): 97.4 Ambulatory Status: Ambulatory Pulse (bpm): 96 Discharge Destination: Home Respiratory Rate (breaths/min): 20 Transportation: Private Auto Blood Pressure (mmHg): 193/89 Accompanied By: self Schedule Follow-up Appointment: Yes Clinical Summary of Care: Electronic Signature(s) Signed: 01/08/2022 11:48:07 AM By: April East RN Entered By: [name] on 01/08/2022 11:48:07 -------------------------------------------------------------------------------- Lower Extremity Assessment Details Patient Name: Date of Service: [name]. 01/08/2022 10:45 A M Medical Record Number: [id number] Patient Account Number: [id number] Date of Birth/Sex: Treating RN: 1945/11/26 (77 y.o. [name] Primary Care [name]: [name] Other Clinician: Referring Mayline Dragon: Treating Lanice Folden/Extender: [name] in Treatment: 15 Edema  Assessment Assessed: [Left: No] [Right: No] Edema: [Left: N] [Right: o] Calf Left: Right: Point of Measurement: From Medial Instep 28 cm 27 cm Ankle Left: Right: Point of Measurement: From Medial Instep 19 cm 19 cm Vascular Assessment Pulses: Dorsalis Pedis Palpable: [Right:Yes] Electronic Signature(s) Signed: 01/08/2022 12:02:32 PM By: April Catholic RN Entered By: [name] on 01/08/2022 10:52:24 Multi Wound Chart Details -------------------------------------------------------------------------------- [name] ([id number]) [id number] Page 3 of 8 Patient Name: Date of Service: [name], [name] 01/08/2022 10:45 A M Medical Record Number: [id number] Patient Account Number: [id number] Date of Birth/Sex: Treating RN:  07-05-1945 (77 y.o. F) Primary Care [name]: [name] Other Clinician: Referring [name]: Treating [name]: [name] in Treatment: 15 Vital Signs Height(in): 60 Pulse(bpm): 29 Weight(lbs): 101 Blood Pressure(mmHg): 193/89 Body Mass Index(BMI): 19.7 Temperature(F): 97.4 Respiratory Rate(breaths/min): 20 Wound Assessments Wound Number: 2 4 N/A Photos: No Photos N/A Right, Lateral Lower Leg Left Knee N/A Wound Location: Surgical Injury Other Lesion N/A Wounding Event: Open Surgical Wound Lesion N/A Primary Etiology: Venous Leg Ulcer N/A N/A Secondary Etiology: Cataracts, Chronic sinus Cataracts, Chronic sinus N/A Comorbid History: problems/congestion, Hypertension, problems/congestion, Hypertension, Crohns, Osteoarthritis Crohns, Osteoarthritis 08/17/2021 11/23/2021 N/A Date Acquired: 15 4 N/A [name] of Treatment: Open Healed - Epithelialized N/A Wound Status: No No N/A Wound Recurrence: Yes No N/A Clustered Wound: 4.9x2x0.1 0x0x0 N/A Measurements L x W x D (cm) 7.697 0 N/A A (cm) : rea 0.77 0 N/A Volume (cm) : 57.60% 100.00% N/A % Reduction in A  rea: 57.60% 100.00% N/A % Reduction in Volume: Full Thickness Without Exposed Full Thickness Without Exposed N/A Classification: Support Structures Support Structures Large None Present N/A Exudate A mount: Serous N/A N/A Exudate Type: amber N/A N/A Exudate Color: Distinct, outline attached Distinct, outline attached N/A Wound Margin: Large (67-100%) None Present (0%) N/A Granulation A mount: Red N/A N/A Granulation Quality: Small (1-33%) None Present (0%) N/A Necrotic A mount: Eschar, Adherent Slough N/A N/A Necrotic Tissue: Fat Layer (Subcutaneous Tissue): Yes Fascia: No N/A Exposed Structures: Fascia: No Fat Layer (Subcutaneous Tissue): No Tendon: No Tendon: No Muscle: No Muscle: No Joint: No Joint: No Bone: No Bone: No Medium (34-66%) Large (67-100%) N/A Epithelialization: Debridement - Selective/Open Wound N/A N/A Debridement: Pre-procedure Verification/Time Out 10:58 N/A N/A Taken: Lidocaine 4% Topical Solution N/A N/A Pain Control: Necrotic/Eschar, Slough N/A N/A Tissue Debrided: Non-Viable Tissue N/A N/A Level: 9.8 N/A N/A Debridement A (sq cm): rea Curette N/A N/A Instrument: Minimum N/A N/A Bleeding: Pressure N/A N/A Hemostasis A chieved: Procedure was tolerated well N/A N/A Debridement Treatment Response: 4.9x2x0.1 N/A N/A Post Debridement Measurements L x W x D (cm) 0.77 N/A N/A Post Debridement Volume: (cm) Excoriation: No No Abnormalities Noted N/A Periwound Skin Texture: Induration: No Callus: No Crepitus: No Rash: No Scarring: No Dry/Scaly: No Dry/Scaly: Yes N/A Periwound Skin Moisture: Maceration: No Atrophie April: No No Abnormalities Noted N/A Periwound Skin ColorMERELIN, [name] ([id number]) [id number] Page 4 of 8 Cyanosis: No Ecchymosis: No Erythema: No Hemosiderin Staining: No Mottled: No Pallor: No Rubor: No No Abnormality No Abnormality N/A Temperature: N/A Patient stated that  Plastic surgery is N/A Assessment Notes: managing the Left knee Compression Therapy N/A N/A Procedures Performed: Debridement Treatment Notes Electronic Signature(s) Signed: 01/08/2022 11:03:18 AM By: [name] MD FACS Entered By: [name] on 01/08/2022 11:03:18 -------------------------------------------------------------------------------- [place] Details Patient Name: Date of Service: [name]. 01/08/2022 10:45 A M Medical Record Number: [id number] Patient Account Number: [id number] Date of Birth/Sex: Treating RN: [birth year] (77 y.o. [name] Primary Care [name] [name]: [name] Other Clinician: Referring Kalvyn Desa: Treating Boone Gear/Extender: [name] in Treatment: 15 Multidisciplinary Care Plan reviewed with physician Active Inactive Abuse / Safety / Falls / Self Care Management April Diagnoses: History of Falls Potential for falls Goals: Patient will not experience any injury related to falls Date Initiated: 12/07/2021 Target Resolution Date: 02/01/2022 Goal Status: Active Patient/caregiver will verbalize/demonstrate measures taken to prevent injury and/or falls Date Initiated: 09/20/2021 Date Inactivated: 10/19/2021 Target Resolution Date: 10/18/2021 Goal Status: Met  Interventions: Assess fall risk on admission and as needed Assess impairment of mobility on admission and as needed per policy Notes: Pressure April Diagnoses: Knowledge deficit related to causes and risk factors for pressure ulcer development Goals: Patient will remain free of pressure ulcers Date Initiated: 12/07/2021 Target Resolution Date: 02/01/2022 Goal Status: Active Interventions: Assess potential for pressure ulcer upon admission and as needed Notes: CAMY, LEDER [name] ([id number]) [id number] Page 5 of 8 Electronic Signature(s) Signed: 01/08/2022 4:19:49 PM By: April East  RN Entered By: [name] on 01/08/2022 10:55:48 -------------------------------------------------------------------------------- Pain Assessment Details Patient Name: Date of Service: [name], [name]. 01/08/2022 10:45 A M Medical Record Number: [id number] Patient Account Number: [id number] Date of Birth/Sex: Treating RN: [birth year] (77 y.o. F) Primary Care Desmond Szabo: [name] Other Clinician: Referring Eligha Kmetz: Treating Niguel Moure/Extender: [name] in Treatment: 15 Active Problems Location of Pain Severity and Description of Pain Patient Has Paino No Site Locations Pain Management and Medication Current Pain Management: Electronic Signature(s) Signed: 01/08/2022 11:03:58 AM By: [name] Entered By: [name] on 01/08/2022 10:41:28 -------------------------------------------------------------------------------- Patient/Caregiver Education Details Patient Name: Date of Service: [name]. 1/2/2024andnbsp10:45 [place] Record Number: [id number] Patient Account Number: [id number] Date of Birth/Gender: Treating RN: 1945/06/13 (77 y.o. [name] Primary Care Physician: [name] Other Clinician: Referring Physician: Treating Physician/Extender: [name] in Treatment: 15 Education Assessment Education Provided To: Patient [name], [name] ([id number]) [id number] Page 6 of 8 Education Topics Provided Wound Debridement: Methods: Explain/Verbal Responses: Reinforcements needed, State content correctly Wound/Skin Impairment: Methods: Explain/Verbal Responses: Reinforcements needed, State content correctly Electronic Signature(s) Signed: 01/08/2022 4:19:49 PM By: April East RN Entered By: [name] on 01/08/2022 11:01:27 -------------------------------------------------------------------------------- Wound Assessment Details Patient Name: Date  of Service: [name]. 01/08/2022 10:45 A M Medical Record Number: [id number] Patient Account Number: [id number] Date of Birth/Sex: Treating RN: [birth year] (77 y.o. F) Primary Care Agusta Hackenberg: [name] Other Clinician: Referring Marge Vandermeulen: Treating Tenisha Fleece/Extender: [name] in Treatment: 15 Wound Status Wound Number: 2 Primary Open Surgical Wound Etiology: Wound Location: Right, Lateral Lower Leg Secondary Venous Leg Ulcer Wounding Event: Surgical Injury Etiology: Date Acquired: 08/17/2021 Wound Status: Open [name] Of Treatment: 15 Comorbid Cataracts, Chronic sinus problems/congestion, Hypertension, Clustered Wound: Yes History: Crohns, Osteoarthritis Photos Wound Measurements Length: (cm) 4.9 Width: (cm) 2 Depth: (cm) 0.1 Area: (cm) 7.697 Volume: (cm) 0.77 % Reduction in Area: 57.6% % Reduction in Volume: 57.6% Epithelialization: Medium (34-66%) Tunneling: No Undermining: No Wound Description Classification: Full Thickness Without Exposed Suppor Wound Margin: Distinct, outline attached Exudate Amount: Large Exudate Type: Serous Exudate Color: amber t Structures Foul Odor After Cleansing: No Slough/Fibrino Yes Wound Bed Granulation Amount: Large (67-100%) Exposed Structure Granulation Quality: Red Fascia Exposed: No Necrotic Amount: Small (1-33%) Fat Layer (Subcutaneous Tissue) Exposed: Yes Necrotic Quality: Daniel, Adherent Slough Tendon Exposed: No [name], [name] ([id number]) [id number] Page 7 of 8 Muscle Exposed: No Joint Exposed: No Bone Exposed: No Periwound Skin Texture Texture Color No Abnormalities Noted: Yes No Abnormalities Noted: Yes Moisture Temperature / Pain No Abnormalities Noted: Yes Temperature: No Abnormality Treatment Notes Wound #2 (Lower Leg) Wound Laterality: Right, Lateral Cleanser Soap and Water Discharge Instruction: May shower and wash wound with dial  antibacterial soap and water prior to dressing change. Wound Cleanser Discharge Instruction: Cleanse the wound with wound cleanser prior to applying a clean dressing using gauze sponges, not tissue or cotton balls.  Peri-Wound Care Sween Lotion (Moisturizing lotion) Discharge Instruction: Apply moisturizing lotion as directed Topical Primary Dressing Sorbalgon AG Dressing, 4x4 (in/in) Discharge Instruction: Apply to wound bed as instructed Secondary Dressing Zetuvit Plus 4x8 in Discharge Instruction: Apply over primary dressing as directed. Secured With Transpore Surgical Tape, 2x10 (in/yd) Discharge Instruction: Secure dressing with tape as directed. Compression Wrap ThreePress (3 layer compression wrap) Discharge Instruction: Apply three layer compression as directed. Compression Stockings Add-Ons Electronic Signature(s) Signed: 01/08/2022 12:02:32 PM By: April Catholic RN Entered By: [name] on 01/08/2022 10:52:11 -------------------------------------------------------------------------------- Wound Assessment Details Patient Name: Date of Service: [name]. 01/08/2022 10:45 A M Medical Record Number: [id number] Patient Account Number: [id number] Date of Birth/Sex: Treating RN: [birth year] (77 y.o. [name] Primary Care Kiela Shisler: [name] Other Clinician: Referring Jansel Vonstein: Treating Tamiah Dysart/Extender: [name] in Treatment: 15 Wound Status Wound Number: 4 Primary Lesion Etiology: Wound Location: Left Knee Wound Healed - Epithelialized Wounding Event: Other Lesion Status: Date Acquired: 11/23/2021 Comorbid Cataracts, Chronic sinus problems/congestion, Hypertension, [name] Of Treatment: 4 History: Crohns, Osteoarthritis Clustered Wound: No [name], [name] ([id number]) [id number] Page 8 of 8 Wound Measurements Length: (cm) Width: (cm) Depth: (cm) Area: (cm) Volume: (cm) 0  % Reduction in Area: 100% 0 % Reduction in Volume: 100% 0 Epithelialization: Large (67-100%) 0 Tunneling: No 0 Undermining: No Wound Description Classification: Full Thickness Without Exposed Support Structures Wound Margin: Distinct, outline attached Exudate Amount: None Present Foul Odor After Cleansing: No Slough/Fibrino No Wound Bed Granulation Amount: None Present (0%) Exposed Structure Necrotic Amount: None Present (0%) Fascia Exposed: No Fat Layer (Subcutaneous Tissue) Exposed: No Tendon Exposed: No Muscle Exposed: No Joint Exposed: No Bone Exposed: No Periwound Skin Texture Texture Color No Abnormalities Noted: Yes No Abnormalities Noted: Yes Moisture Temperature / Pain No Abnormalities Noted: Yes Temperature: No Abnormality Assessment Notes Patient stated that Plastic surgery is managing the Left knee Electronic Signature(s) Signed: 01/08/2022 12:02:32 PM By: April Catholic RN Entered By: [name] on 01/08/2022 10:51:05 -------------------------------------------------------------------------------- [place] Details Patient Name: Date of Service: [name]. 01/08/2022 10:45 A M Medical Record Number: [id number] Patient Account Number: [id number] Date of Birth/Sex: Treating RN: 04/22/1945 (77 y.o. F) Primary Care Mack Alvidrez: [name] Other Clinician: Referring Haygen Zebrowski: Treating Tabatha Razzano/Extender: [name] in Treatment: 15 Vital Signs Time Taken: 10:40 Temperature (F): 97.4 Height (in): 60 Pulse (bpm): 96 Weight (lbs): 101 Respiratory Rate (breaths/min): 20 Body Mass Index (BMI): 19.7 Blood Pressure (mmHg): 193/89 Reference Range: 80 - 120 mg / dl Electronic Signature(s) Signed: 01/08/2022 11:03:58 AM By: [name] Entered By: [name] on 01/08/2022 10:41:16

## 2022-01-08 NOTE — Telephone Encounter (Signed)
Patient called requesting a refill of her Lomotil asap as she is almost out.  Thank you.

## 2022-01-08 NOTE — Progress Notes (Signed)
NAHIMA, ALES [name] ([id number]) [id number] Page 1 of 11 Visit Report for 01/08/2022 Chief Complaint Document Details Patient Name: Date of Service: [name], [name] 01/08/2022 10:45 A M Medical Record Number: [id number] Patient Account Number: [id number] Date of Birth/Sex: Treating RN: 1945/03/04 (77 y.o. F) Primary Care Provider: Domingo Mend Other Clinician: Referring Provider: Treating Provider/Extender: Caprice Kluver in Treatment: 15 Information Obtained from: Patient Chief Complaint 11/15/2020; patient is here for review of a wound area on the right posterior calf 09/20/2021: Patient is here for a nonhealing wound after undergoing Mohs surgery on her right lower leg. 12/07/2021: Reopening of skin at site of previous wound, new wound on right toe, concerning lesion on left upper leg just above knee Electronic Signature(s) Signed: 01/08/2022 11:03:25 AM By: [name] MD FACS Entered By: [name] on 01/08/2022 11:03:25 -------------------------------------------------------------------------------- Debridement Details Patient Name: Date of Service: [name]. 01/08/2022 10:45 A M Medical Record Number: [id number] Patient Account Number: [id number] Date of Birth/Sex: Treating RN: 10/17/1945 (77 y.o. Iver Nestle, Jamie Primary Care Provider: Domingo Mend Other Clinician: Referring Provider: Treating Provider/Extender: Gevena Cotton Weeks in Treatment: 15 Debridement Performed for Assessment: Wound #2 Right,Lateral Lower Leg Performed By: Physician [name], MD Debridement Type: Debridement Severity of Tissue Pre Debridement: Fat layer exposed Level of Consciousness (Pre-procedure): Awake and Alert Pre-procedure Verification/Time Out Yes - 10:58 Taken: Start Time: 10:59 Pain Control: Lidocaine 4% T opical Solution T Area Debrided (L x W): otal 4.9 (cm) x 2 (cm) = 9.8  (cm) Tissue and other material debrided: Non-Viable, Eschar, Slough, Slough Level: Non-Viable Tissue Debridement Description: Selective/Open Wound Instrument: Curette Bleeding: Minimum Hemostasis Achieved: Pressure Response to Treatment: Procedure was tolerated well Level of Consciousness (Post- Awake and Alert procedure): Post Debridement Measurements of Total Wound Length: (cm) 4.9 Width: (cm) 2 Depth: (cm) 0.1 Volume: (cm) 0.77 Character of Wound/Ulcer Post Debridement: Requires Further Debridement Severity of Tissue Post Debridement: Fat layer exposed [name], [name] ([id number]) [id number] Page 2 of 11 Post Procedure Diagnosis Same as Pre-procedure Notes Scribed for Dr. [name] by Blanche East, RN Electronic Signature(s) Signed: 01/08/2022 12:20:40 PM By: [name] MD FACS Signed: 01/08/2022 4:19:49 PM By: Blanche East RN Entered By: Blanche East on 01/08/2022 11:00:54 -------------------------------------------------------------------------------- HPI Details Patient Name: Date of Service: [name]. 01/08/2022 10:45 A M Medical Record Number: [id number] Patient Account Number: [id number] Date of Birth/Sex: Treating RN: 11/17/1945 (77 y.o. F) Primary Care Provider: Domingo Mend Other Clinician: Referring Provider: Treating Provider/Extender: Gevena Cotton Weeks in Treatment: 15 History of Present Illness HPI Description: ADMISSION 11/15/2020 This is a 77 year old woman with a history of squamous cell cancer skin cancer followed by Dr. Anabel Bene dermatology. She describes having a wound on the right posterior calf dating back since September. She saw her primary doctor in early October who suggested petroleum infused gauze. The patient has been doing this. No real improvement in fact she says this is getting larger. She does not complain much of pain. She is completely uncertain about how this happened  there was no trauma she simply became aware of it at some point describing it is about the size of a quarter but it has been getting progressively larger since then. She is here for review of this. Past medical history Crohn's disease not currently on prednisone, gastroesophageal reflux disease, history of squamous cell skin cancer, history of rheumatoid arthritis, hypertension 11/16; this lady had  a large superficial atypical wound on the right posterior calf. She has significant solar skin damage probably some degree of venous insufficiency but she described a rapidly expanding wound area. Because of this and a history of skin cancer [name] went ahead and did a shave biopsy but we do not have the results of this at the time we saw her in clinic today. She has been using silver alginate and kerlix 11/30; shave biopsy [name] did last time did not show any malignancy or fungal elements. Suggestion of a stasis ulcer. With our treatment which is simply been silver alginate with kerlix that she is changing daily the wound is down dramatically almost surprising how rapidly it is reduced in surface area 12/14; patient comes in today with her wound healed this was on the right posterior calf. She has significant solar skin damage probably some degree of venous insufficiency however she described a rapidly expanding wound area on admission. Because of this and a history of skin cancer [name] did do a shave biopsy but nothing was really found of worry. 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 Unfortunately, the wound has failed to heal. There is thick slough on the wound surface. The periwound skin is in reasonably good condition. There is granulation tissue forming  underneath the slough. 09/28/2021: The wound has deteriorated rather significantly over the past week. She had fairly copious drainage that she noticed as early as Monday, but she did not contact our office. Today there is a lot of periwound skin breakdown and a very pungent odor coming from the wound. There is thick slough on the wound surface but there is still good granulation tissue underlying this. Despite this, however, the wound actually measures smaller. 10/05/2021: The culture that [name] took grew out MRSA. [name] prescribed doxycycline and she is still taking this. There has been a tremendous improvement in the wound. It is smaller and has contracted considerably. There is hypertrophic granulation tissue present. No malodorous drainage. 10/16/2021: The wound has contracted further and is epithelializing. The surface is very clean and the hypertrophic granulation tissue has not recurred. She completed her course of doxycycline. 10/19; surgical wound on the right medial lower extremity secondary to Mohs surgery done in August. She is using Hydrofera Blue under 3 layer compression and making excellent progress. 11/02/2021: The wound is nearly completely epithelialized. Just a couple of open areas with some eschar overlying the surface. 11/12/2021: Just 1 small open area remains. It is clean without any slough or eschar accumulation. No hypertrophic granulation tissue present. 11/19/2021: Her wound is healed. READMISSION 12/07/2021 The patient has been moving for the past couple of weeks and has spent a substantial amount of time on her feet. As result, she developed leg swelling and the tissue where her wound had been, which she was protecting with a Band-Aid, broke down considerably. She now has a large wound that is irregular with [name], [name] ([id number]) [id number] Page 3 of 11 extensive slough, involving the fat layer. She also developed a blister on her toe that has  slough and eschar present. She also pointed out lump just above her left knee that she is concerned about, as she does have a history of multiple skin cancers. 12/11; patient comes in  the clinic with a large wound on the right anterior lower leg. This was initially a skin cancer underwent Mohs surgery and was treated in this clinic and healed out on 11/13. The patient states the wound reopened with edema and also trauma from a dressing she was putting on it. 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 She also has a wound on the right second toe. The patient states quite adamantly she will not go back to her previous dermatologist for the skin surgery center in [place] blaming them for the original wound on her right lateral leg. [name] am not exactly sure of her rationale but at this point she certainly will not consider a referral there 12/18; the patient has an appointment at [facility] on 12/28 although [name] am not sure exactly who she is seeing. This is in reference to the skin cancer just above her left patella. She would not go back to the skin surgery or indeed her dermatologist in [place]. She had an area on the right second toe this is healed today. Her original surgical site on the right anterior lateral lower leg actually looks some better. [name] changed her to Iodoflex last week 01/01/2022: The patient has an appointment with plastic surgery on Thursday to evaluate the biopsy-proven squamous cell carcinoma on her left upper leg. The wound on her right lower leg has a thick layer of slough on the surface and has a very atypical appearance. 01/08/2022: The biopsies that [name] took last week from the wound on her right lower extremity were negative for malignancy but did demonstrate some bacteria along with inflammation. She met with the plastic surgeon on Thursday and she is going to undergo wide local excision of the biopsy-proven squamous cell carcinoma on her  left upper leg. The right lower leg wound is narrower again this week. There is slough and eschar as well as some fibrinous debris in the areas where [name] took the biopsies and obtained hemostasis with silver nitrate. Electronic Signature(s) Signed: 01/08/2022 11:08:38 AM By: [name] MD FACS Previous Signature: 01/08/2022 11:04:29 AM Version By: [name] MD FACS Entered By: [name] on 01/08/2022 11:08:38 -------------------------------------------------------------------------------- Physical Exam Details Patient Name: Date of Service: [name]. 01/08/2022 10:45 A M Medical Record Number: [id number] Patient Account Number: [id number] Date of Birth/Sex: Treating RN: 09-29-1945 (77 y.o. F) Primary Care Provider: Domingo Mend Other Clinician: Referring Provider: Treating Provider/Extender: Gevena Cotton Weeks in Treatment: 15 Constitutional She is hypertensive, but asymptomatic.. . . . . No acute distress. Respiratory Normal work of breathing on room air. Notes 01/08/2022: The right lower leg wound is narrower again this week. There is slough and eschar as well as some fibrinous debris in the areas where [name] took the biopsies and obtained hemostasis with silver nitrate. Electronic Signature(s) Signed: 01/08/2022 11:09:30 AM By: [name] MD FACS Entered By: [name] on 01/08/2022 11:09:30 -------------------------------------------------------------------------------- Physician Orders Details Patient Name: Date of Service: [name]. 01/08/2022 10:45 A M Medical Record Number: [id number] Patient Account Number: [id number] Date of Birth/Sex: Treating RN: [birth year] (77 y.o. 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 - Dr. [name] Rm 1 Return A Nurse Visit: - room 2 12/29 at 10:00 AM Bathing/ Shower/ Hygiene May shower with protection but do not get wound dressing(s) wet. Protect dressing(s) with water repellant cover (for example, large plastic bag) or a cast cover and may then take shower. Edema Control - Lymphedema / SCD / Other Avoid standing for long periods of time. Moisturize legs daily. Wound Treatment Wound #2 - Lower Leg Wound Laterality: Right, Lateral Cleanser: Soap and Water 1 x Per Week/30 Days Discharge Instructions: May shower and wash wound with dial antibacterial soap and water prior to dressing change. Cleanser: Wound Cleanser 1 x Per Week/30 Days Discharge Instructions: Cleanse the wound with wound cleanser prior to applying a clean dressing using gauze sponges, not tissue or cotton balls. Peri-Wound Care: Sween Lotion (Moisturizing lotion) 1 x Per Week/30 Days Discharge Instructions: Apply moisturizing lotion as directed Prim Dressing: Sorbalgon AG Dressing, 4x4 (in/in) 1 x Per Week/30 Days ary Discharge Instructions: Apply to wound bed as instructed Secondary Dressing: Zetuvit Plus 4x8 in 1 x Per Week/30 Days Discharge Instructions: Apply over primary dressing as directed. Secured With: Transpore Surgical Tape, 2x10 (in/yd) 1 x Per Week/30  Days Discharge Instructions: Secure dressing with tape as directed. Compression Wrap: ThreePress (3 layer compression wrap) 1 x Per Week/30 Days Discharge Instructions: Apply three layer compression as directed. Electronic Signature(s) Signed: 01/08/2022 12:20:40 PM By: [name] MD FACS Entered By: [name] on 01/08/2022 11:09:43 -------------------------------------------------------------------------------- Problem List Details Patient Name: Date of Service: [name]. 01/08/2022 10:45 A M Medical Record Number: [id number] Patient Account Number: [id number] Date of Birth/Sex: Treating RN: January 25, 1945 (77 y.o. F) Primary Care Provider: Domingo Mend Other Clinician: Referring Provider: Treating Provider/Extender: Caprice Kluver in Treatment: [address] [name], [name] ([id number]) [id number] Page 5 of 11 ICD-10 Encounter Code Description Active Date MDM Diagnosis L97.812 Non-pressure chronic ulcer of other part of right lower leg with fat layer 09/20/2021 No Yes exposed T81.31XS Disruption of external operation (surgical) wound, not elsewhere classified, 09/20/2021 No Yes sequela L98.9 Disorder of the skin and subcutaneous tissue, unspecified 12/07/2021 No Yes C44.722 Squamous cell carcinoma of skin of right lower limb, including hip 09/20/2021 No Yes I10 Essential (primary) hypertension 09/20/2021 No Yes K50.90 Crohn's disease, unspecified, without complications 4/94/4967 No Yes E43 Unspecified severe protein-calorie malnutrition 09/20/2021 No Yes Inactive Problems ICD-10 Code Description Active Date Inactive Date L97.512 Non-pressure chronic ulcer of other part of right foot with fat layer exposed 12/07/2021 12/07/2021 Resolved Problems Electronic Signature(s) Signed: 01/08/2022 11:03:14 AM By: [name] MD FACS Entered By: [name] on 01/08/2022  11:03:13 -------------------------------------------------------------------------------- Progress Note Details Patient Name: Date of Service: [name]. 01/08/2022 10:45 A M Medical Record Number: [id number] Patient Account Number: [id number] Date of Birth/Sex: Treating RN: 07-12-1945 (77 y.o. F) Primary Care Provider: Domingo Mend Other Clinician: Referring Provider: Treating Provider/Extender: Caprice Kluver in Treatment: 15 Subjective Chief Complaint Information obtained from Patient 11/15/2020; patient is here for review of a wound area on the right posterior calf 09/20/2021: Patient is here for a nonhealing wound after undergoing Mohs surgery  on her right lower leg. 12/07/2021: Reopening of skin at site of previous wound, new wound on right toe, concerning lesion on left upper leg just above knee History of Present Illness (HPI) ADMISSION 11/15/2020 JAANA, BRODT [name] ([id number]) [id number] Page 6 of 11 This is a 77 year old woman with a history of squamous cell cancer skin cancer followed by Dr. Anabel Bene dermatology. She describes having a wound on the right posterior calf dating back since September. She saw her primary doctor in early October who suggested petroleum infused gauze. The patient has been doing this. No real improvement in fact she says this is getting larger. She does not complain much of pain. She is completely uncertain about how this happened there was no trauma she simply became aware of it at some point describing it is about the size of a quarter but it has been getting progressively larger since then. She is here for review of this. Past medical history Crohn's disease not currently on prednisone, gastroesophageal reflux disease, history of squamous cell skin cancer, history of rheumatoid arthritis, hypertension 11/16; this lady had a large superficial atypical wound on the right posterior calf. She  has significant solar skin damage probably some degree of venous insufficiency but she described a rapidly expanding wound area. Because of this and a history of skin cancer [name] went ahead and did a shave biopsy but we do not have the results of this at the time we saw her in clinic today. She has been using silver alginate and kerlix 11/30; shave biopsy [name] did last time did not show any malignancy or fungal elements. Suggestion of a stasis ulcer. With our treatment which is simply been silver alginate with kerlix that she is changing daily the wound is down dramatically almost surprising how rapidly it is reduced in surface area 12/14; patient comes in today with her wound healed this was on the right posterior calf. She has significant solar skin damage probably some degree of venous insufficiency however she described a rapidly expanding wound area on admission. Because of this and a history of skin cancer [name] did do a shave biopsy but nothing was really found of worry. 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 Unfortunately, the wound has failed to heal. There is thick slough on the wound surface. The periwound skin is in reasonably good condition. There is granulation tissue forming underneath the slough. 09/28/2021: The wound has deteriorated rather significantly over the past week. She had fairly copious drainage that she noticed as early as Monday, but she did not contact our office. Today there is a lot of periwound skin breakdown and a very pungent odor coming from the wound. There is thick slough on the wound surface but there is still good granulation tissue underlying this. Despite this, however, the wound actually measures smaller. 10/05/2021:  The culture that [name] took grew out MRSA. [name] prescribed doxycycline and she is still taking this. There has been a tremendous improvement in the wound. It is smaller and has contracted considerably. There is hypertrophic granulation tissue present. No malodorous drainage. 10/16/2021: The wound has contracted further and is epithelializing. The surface is very clean and the hypertrophic granulation tissue has not recurred. She completed her course of doxycycline. 10/19; surgical wound  on the right medial lower extremity secondary to Mohs surgery done in August. She is using Hydrofera Blue under 3 layer compression and making excellent progress. 11/02/2021: The wound is nearly completely epithelialized. Just a couple of open areas with some eschar overlying the surface. 11/12/2021: Just 1 small open area remains. It is clean without any slough or eschar accumulation. No hypertrophic granulation tissue present. 11/19/2021: Her wound is healed. READMISSION 12/07/2021 The patient has been moving for the past couple of weeks and has spent a substantial amount of time on her feet. As result, she developed leg swelling and the tissue where her wound had been, which she was protecting with a Band-Aid, broke down considerably. She now has a large wound that is irregular with extensive slough, involving the fat layer. She also developed a blister on her toe that has slough and eschar present. She also pointed out lump just above her left knee that she is concerned about, as she does have a history of multiple skin cancers. 12/11; patient comes in the clinic with a large wound on the right anterior lower leg. This was initially a skin cancer underwent Mohs surgery and was treated in this clinic and healed out on 11/13. The patient states the wound reopened with edema and also trauma from a dressing she was putting on it. 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 She also has  a wound on the right second toe. The patient states quite adamantly she will not go back to her previous dermatologist for the skin surgery center in [place] blaming them for the original wound on her right lateral leg. [name] am not exactly sure of her rationale but at this point she certainly will not consider a referral there 12/18; the patient has an appointment at [facility] on 12/28 although [name] am not sure exactly who she is seeing. This is in reference to the skin cancer just above her left patella. She would not go back to the skin surgery or indeed her dermatologist in [place]. She had an area on the right second toe this is healed today. Her original surgical site on the right anterior lateral lower leg actually looks some better. [name] changed her to Iodoflex last week 01/01/2022: The patient has an appointment with plastic surgery on Thursday to evaluate the biopsy-proven squamous cell carcinoma on her left upper leg. The wound on her right lower leg has a thick layer of slough on the surface and has a very atypical appearance. 01/08/2022: The biopsies that [name] took last week from the wound on her right lower extremity were negative for malignancy but did demonstrate some bacteria along with inflammation. She met with the plastic surgeon on Thursday and she is going to undergo wide local excision of the biopsy-proven squamous cell carcinoma on her left upper leg. The right lower leg wound is narrower again this week. There is slough and eschar as well as some fibrinous debris in the areas where [name] took the biopsies and obtained hemostasis with silver nitrate. Patient History Information obtained from Patient. Family History Diabetes - Father, Heart Disease - Father, Hypertension - Father, No family history of Cancer, Hereditary Spherocytosis, Kidney Disease, Lung Disease, Seizures, Stroke, Thyroid Problems, Tuberculosis. Social History Former smoker - quit 30 years ago, Marital Status - Married,  Alcohol Use - Never, Drug Use - No History, Caffeine Use - Rarely. Medical History Eyes Patient has history of Cataracts - Had cataract surgery Denies history of  Glaucoma, Optic Neuritis Ear/Nose/Mouth/Throat [name], [name] ([id number]) [id number] Page 7 of 11 Patient has history of Chronic sinus problems/congestion - Post Nasal drip Denies history of Middle ear problems Hematologic/Lymphatic Denies history of Anemia, Hemophilia, Human Immunodeficiency Virus, Lymphedema, Sickle Cell Disease Respiratory Denies history of Aspiration, Asthma, Chronic Obstructive Pulmonary Disease (COPD), Pneumothorax, Sleep Apnea, Tuberculosis Cardiovascular Patient has history of Hypertension Gastrointestinal Patient has history of Crohnoos - Has Chrons Disease Denies history of Cirrhosis , Colitis, Hepatitis A, Hepatitis B, Hepatitis C Endocrine Denies history of Type [name] Diabetes, Type II Diabetes Genitourinary Denies history of End Stage Renal Disease Immunological Denies history of Lupus Erythematosus, Raynaudoos, Scleroderma Musculoskeletal Patient has history of Osteoarthritis - Knees Denies history of Gout, Rheumatoid Arthritis, Osteomyelitis Neurologic Denies history of Dementia, Neuropathy, Quadriplegia, Paraplegia, Seizure Disorder Oncologic Denies history of Received Chemotherapy, Received Radiation Psychiatric Denies history of Anorexia/bulimia, Confinement Anxiety Hospitalization/Surgery History - bil TKA. - Moh's procedure right leg 08/17/21. - bil cataract extractions. - nasal fx surgery. Medical A Surgical History Notes nd Gastrointestinal Has a large hiatal hernia. Has Chrons Disease., GERD, pancreatic insufficiency Endocrine Pancreatic Insufficiency Genitourinary stage 3 Chronic Kidney disease Integumentary (Skin) Had Squamous Cell (skin cancer). Had a skin graft on Right knee (area) Musculoskeletal Had Bil knee replacements, hx cervical spine  fx, sternal fx, thoracis spine fx Neurologic left foot drop Oncologic Had Squamous Cell (skin cell) Objective Constitutional She is hypertensive, but asymptomatic.Marland Kitchen No acute distress. Vitals Time Taken: 10:40 AM, Height: 60 in, Weight: 101 lbs, BMI: 19.7, Temperature: 97.4 F, Pulse: 96 bpm, Respiratory Rate: 20 breaths/min, Blood Pressure: 193/89 mmHg. Respiratory Normal work of breathing on room air. General Notes: 01/08/2022: The right lower leg wound is narrower again this week. There is slough and eschar as well as some fibrinous debris in the areas where [name] took the biopsies and obtained hemostasis with silver nitrate. Integumentary (Hair, Skin) Wound #2 status is Open. Original cause of wound was Surgical Injury. The date acquired was: 08/17/2021. The wound has been in treatment 15 weeks. The wound is located on the Right,Lateral Lower Leg. The wound measures 4.9cm length x 2cm width x 0.1cm depth; 7.697cm^2 area and 0.77cm^3 volume. There is Fat Layer (Subcutaneous Tissue) exposed. There is no tunneling or undermining noted. There is a large amount of serous drainage noted. The wound margin is distinct with the outline attached to the wound base. There is large (67-100%) red granulation within the wound bed. There is a small (1-33%) amount of necrotic tissue within the wound bed including Eschar and Adherent Slough. The periwound skin appearance had no abnormalities noted for texture. The periwound skin appearance had no abnormalities noted for moisture. The periwound skin appearance had no abnormalities noted for color. Periwound temperature was noted as No Abnormality. Wound #4 status is Healed - Epithelialized. Original cause of wound was Other Lesion. The date acquired was: 11/23/2021. The wound has been in treatment 4 weeks. The wound is located on the Left Knee. The wound measures 0cm length x 0cm width x 0cm depth; 0cm^2 area and 0cm^3 volume. There is no tunneling or undermining  noted. There is a none present amount of drainage noted. The wound margin is distinct with the outline attached to the wound base. There is no granulation within the wound bed. There is no necrotic tissue within the wound bed. The periwound skin appearance had no abnormalities noted for texture. The periwound skin appearance had no abnormalities noted for moisture. The periwound skin appearance had no abnormalities  noted for color. Periwound temperature was noted as No Abnormality. 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 There was a Selective/Open Wound Non-Viable Tissue Debridement with a total area of 9.8 sq cm performed by [name], MD. With the following instrument(s): Curette to remove Non-Viable tissue/material. Material removed includes Eschar and Slough and after achieving pain control using Lidocaine 4% T opical Solution. No specimens were taken. A time out was conducted at 10:58, prior to the start of the procedure. A Minimum amount of bleeding was controlled with Pressure. The procedure was tolerated well. Post Debridement Measurements: 4.9cm length x 2cm width x 0.1cm depth; 0.77cm^3 volume. Character of Wound/Ulcer Post  Debridement requires further debridement. Severity of Tissue Post Debridement is: Fat layer exposed. Post procedure Diagnosis Wound #2: Same as Pre-Procedure General Notes: Scribed for Dr. [name] by Blanche East, RN. Pre-procedure diagnosis of Wound #2 is an Open Surgical Wound located on the Right,Lateral Lower Leg . There was a Three Layer Compression Therapy Procedure by Blanche East, RN. Post procedure Diagnosis Wound #2: Same as Pre-Procedure Plan Follow-up Appointments: Return Appointment in 1 week. - Dr. Alvera Singh 1 Nurse Visit: - room 2 12/29 at 10:00 AM Bathing/ Shower/ Hygiene: May shower with protection but do not get wound dressing(s) wet. Protect dressing(s) with water repellant cover (for example, large plastic bag) or a cast cover and may then take shower. Edema Control - Lymphedema / SCD / Other: Avoid standing for long periods of time. Moisturize legs daily. WOUND #2: - Lower Leg Wound Laterality: Right, Lateral Cleanser: Soap and Water 1 x Per Week/30 Days Discharge Instructions: May shower and wash wound with dial antibacterial soap and water prior to dressing change. Cleanser: Wound Cleanser 1 x Per Week/30 Days Discharge Instructions: Cleanse the wound with wound cleanser prior to applying a clean dressing using gauze sponges, not tissue or cotton balls. Peri-Wound Care: Sween Lotion (Moisturizing lotion) 1 x Per Week/30 Days Discharge Instructions: Apply moisturizing lotion as directed Prim Dressing: Sorbalgon AG Dressing, 4x4 (in/in) 1 x Per Week/30 Days ary Discharge Instructions: Apply to wound bed as instructed Secondary Dressing: Zetuvit Plus 4x8 in 1 x Per Week/30 Days Discharge Instructions: Apply over primary dressing as directed. Secured With: Transpore Surgical T ape, 2x10 (in/yd) 1 x Per Week/30 Days Discharge Instructions: Secure dressing with tape as directed. Com pression Wrap: ThreePress (3 layer compression wrap) 1 x Per Week/30 Days Discharge  Instructions: Apply three layer compression as directed. 01/08/2022: The right lower leg wound is narrower again this week. There is slough and eschar as well as some fibrinous debris in the areas where [name] took the biopsies and obtained hemostasis with silver nitrate. [name] used a curette to debride slough and eschar from the wound. Due to the bacteria seen on the biopsy samples, [name] am going to add topical mupirocin and of the silver alginate and 3 layer compression. She will follow-up in  1 week. Electronic Signature(s) Signed: 01/08/2022 11:10:13 AM By: [name] MD FACS Entered By: [name] on 01/08/2022 11:10:12 [name], [name] ([id number]) [id number] Page 9 of 11 -------------------------------------------------------------------------------- HxROS Details Patient Name: Date of Service: [name], [name] 01/08/2022 10:45 A M Medical Record Number: [id number] Patient Account Number: [id number] Date of Birth/Sex: Treating RN: 02-24-1945 (77 y.o. F) Primary Care Provider: Domingo Mend Other Clinician: Referring Provider: Treating Provider/Extender: Gevena Cotton Weeks in Treatment: 15 Information Obtained From Patient Eyes Medical History: Positive for: Cataracts - Had cataract surgery Negative for: Glaucoma; Optic Neuritis Ear/Nose/Mouth/Throat Medical History: Positive for: Chronic sinus problems/congestion - Post Nasal drip Negative for: Middle ear problems Hematologic/Lymphatic Medical History: Negative for: Anemia; Hemophilia; Human Immunodeficiency Virus; Lymphedema; Sickle Cell Disease Respiratory Medical History: Negative for: Aspiration; Asthma; Chronic Obstructive Pulmonary Disease (COPD); Pneumothorax; Sleep Apnea; Tuberculosis Cardiovascular Medical History: Positive for: Hypertension Gastrointestinal Medical History: Positive for: Crohns - Has Chrons Disease Negative for: Cirrhosis ; Colitis; Hepatitis  A; Hepatitis B; Hepatitis C Past Medical History Notes: Has a large hiatal hernia. Has Chrons Disease., GERD, pancreatic insufficiency Endocrine Medical History: Negative for: Type [name] Diabetes; Type II Diabetes Past Medical History Notes: Pancreatic Insufficiency Genitourinary Medical History: Negative for: End Stage Renal Disease Past Medical History Notes: stage 3 Chronic Kidney disease Immunological Medical History: Negative for: Lupus Erythematosus; Raynauds; Scleroderma Integumentary (Skin) Medical History: Past Medical History Notes: Had Squamous Cell (skin cancer). Had a skin graft on Right knee (area) [name], [name] ([id number]) [id number] Page 10 of 11 Musculoskeletal Medical History: Positive for: Osteoarthritis - Knees Negative for: Gout; Rheumatoid Arthritis; Osteomyelitis Past Medical History Notes: Had Bil knee replacements, hx cervical spine fx, sternal fx, thoracis spine fx Neurologic Medical History: Negative for: Dementia; Neuropathy; Quadriplegia; Paraplegia; Seizure Disorder Past Medical History Notes: left foot drop Oncologic Medical History: Negative for: Received Chemotherapy; Received Radiation Past Medical History Notes: Had Squamous Cell (skin cell) Psychiatric Medical History: Negative for: Anorexia/bulimia; Confinement Anxiety HBO Extended History Items Ear/Nose/Mouth/Throat: Eyes: Chronic sinus Cataracts problems/congestion Immunizations Pneumococcal Vaccine: Received Pneumococcal Vaccination: Yes Received Pneumococcal Vaccination On or After 60th Birthday: Yes Tetanus Vaccine: Last tetanus shot: 11/16/2011 Implantable Devices None Hospitalization / Surgery History Type of Hospitalization/Surgery bil TKA Moh's procedure right leg 08/17/21 bil cataract extractions nasal fx surgery Family and Social History Cancer: No; Diabetes: Yes - Father; Heart Disease: Yes - Father; Hereditary Spherocytosis: No;  Hypertension: Yes - Father; Kidney Disease: No; Lung Disease: No; Seizures: No; Stroke: No; Thyroid Problems: No; Tuberculosis: No; Former smoker - quit 30 years ago; Marital Status - Married; Alcohol Use: Never; Drug Use: No History; Caffeine Use: Rarely; Financial Concerns: No; Food, Clothing or Shelter Needs: No; Support System Lacking: No; Transportation Concerns: No Electronic Signature(s) Signed: 01/08/2022 12:20:40 PM By: [name] MD FACS Entered By: [name] on 01/08/2022 11:08:44 -------------------------------------------------------------------------------- SuperBill Details Patient Name: Date of Service: [name]. 01/08/2022 Medical Record Number: [id number] Patient Account Number: [id number] Date of Birth/Sex: Treating RN: 1945-09-30 (77 y.o. F) Primary Care Provider: Domingo Mend Other Clinician: Referring Provider: Treating Provider/Extender: Caprice Kluver in Treatment: [id number] [place] [name] ([id number]) [id number] Page 11 of 11 Diagnosis Coding ICD-10 Codes Code Description [phone] 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d T81.31XS Disruption of external operation (surgical) wound, not elsewhere classified, sequela L98.9 Disorder of the skin and subcutaneous tissue, unspecified C44.722 Squamous cell carcinoma of skin of right  lower limb, including hip I10 Essential (primary) hypertension K50.90 Crohn's disease, unspecified, without complications O87 Unspecified severe protein-calorie malnutrition Facility Procedures : CPT4 Code: [id number] Description: [phone] - DEBRIDE WOUND 1ST 20 SQ CM OR < ICD-10 Diagnosis Description L97.812 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 Modifier: d Quantity: 1 Physician Procedures : CPT4 Code Description Modifier [id number] - WC PHYS LEVEL 4 - EST PT 25 ICD-10 Diagnosis  Description Y50.354 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d T81.31XS Disruption of external operation (surgical)  wound, not elsewhere classified, sequela C44.722 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

## 2022-01-09 ENCOUNTER — Other Ambulatory Visit: Payer: Self-pay | Admitting: Internal Medicine

## 2022-01-09 ENCOUNTER — Other Ambulatory Visit: Payer: Self-pay

## 2022-01-09 DIAGNOSIS — F339 Major depressive disorder, recurrent, unspecified: Secondary | ICD-10-CM

## 2022-01-09 MED ORDER — DIPHENOXYLATE-ATROPINE 2.5-0.025 MG PO TABS: ORAL_TABLET | ORAL | 2 refills | Status: DC

## 2022-01-09 NOTE — H&P (Signed)
  Subjective:     Patient ID: [name] is a 77 y.o. female.   HPI   Referred by Dr. Dellia [name] for evaluation left knee biopsy proved well differentiated SCC present for month. Patient has history multiple NMSC and underwent Mohs resection RLE wound. This developed chronic wound with waxing and waning of healing and followed by [name].Underwent recent biopsy of RLE wound read as ulcer, inflammation, impetiginous. Per patient Wound Center tried Bactroban and patient reports this made it worse.  Patient scheduled for hiatal hernia repair next month. Per chart review this was canceled in part due to wound. Patient declines returning to Mohs surgeon, feels this led to wound.    PMH significant for Crohns. Reports much of her skin damage related to medications used for treatment of this.    Hx bilateral TKA, complicated by right side wound and underwent treatment with ACell by Dr. Marla [name] for this.   Review of Systems  Skin: Positive for wound.    Remainder 12 point review negative    Objective:   Physical Exam Cardiovascular:     Rate and Rhythm: Normal rate and regular rhythm.     Heart sounds: Normal heart sounds.  Pulmonary:     Effort: Pulmonary effort is normal.     Breath sounds: Normal breath sounds.  Neurological:     Mental Status: She is oriented to person, place, and time.   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 Reviewed impetiginization, infection vs colonization. Treatment of this includes topical Bactroban and recommend she try this again. Placed collagen and dry dressing today, has  scheduled follow up [place] tomorrow. Discussed Wound Center able to offer some skin substitute and encouraged to discuss if she qualifies for any. Counseled I would not offer STSG to her for RLE wound given background significant skin damage throughout body.   With regards to LLE, standard treatment for this is excision. Patient asked if I could guarantee no wound healing problems and counseled, no guarantees regarding this. However natural history of skin cancer to grow. With continued growth, this can lead to extension into adjacent structures, ulceration, distant metastasis. Patient initially declined removal. However after discussion with spouse has decided to proceed. Counseled that I am not a Mohs surgeon and will not be able to ensure clearance margins at time of excision. Reviewed option frozen sections from OR, however she would be under general anesthesia during this time and final pathology may not match frozen sections. If she desires assurance clearance at time of resection recommend treatment by Mohs surgeon. Declines this. Reviewed OP surgery, [place]. Reviewed risks need for additional procedures, infection wound healing problems. Reviewed sutures, post operative limitations.

## 2022-01-11 ENCOUNTER — Other Ambulatory Visit: Payer: Self-pay

## 2022-01-11 ENCOUNTER — Encounter (HOSPITAL_BASED_OUTPATIENT_CLINIC_OR_DEPARTMENT_OTHER): Payer: Self-pay | Admitting: Plastic Surgery

## 2022-01-14 ENCOUNTER — Encounter (HOSPITAL_BASED_OUTPATIENT_CLINIC_OR_DEPARTMENT_OTHER): Payer: Medicare Other | Admitting: General Surgery

## 2022-01-14 DIAGNOSIS — L97812 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d: Secondary | ICD-10-CM | POA: Diagnosis not present

## 2022-01-14 NOTE — Progress Notes (Signed)
PERLIE, STENE I ([id number]) [id number] Page 1 of 11 Visit Report for 01/14/2022 Chief Complaint Document Details Patient Name: Date of Service: [name], [name] 01/14/2022 11:00 A M Medical Record Number: [id number] Patient Account Number: [id number] Date of Birth/Sex: Treating RN: [birth year] (77 y.o. F) Primary Care Provider: Domingo Mend Other Clinician: Referring Provider: Treating Provider/Extender: Caprice Kluver in Treatment: 16 Information Obtained from: Patient Chief Complaint 11/15/2020; patient is here for review of a wound area on the right posterior calf 09/20/2021: Patient is here for a nonhealing wound after undergoing Mohs surgery on her right lower leg. 12/07/2021: Reopening of skin at site of previous wound, new wound on right toe, concerning lesion on left upper leg just above knee Electronic Signature(s) Signed: 01/14/2022 11:27:39 AM By: [name] MD FACS Entered By: [name] on 01/14/2022 11:27:39 -------------------------------------------------------------------------------- Debridement Details Patient Name: Date of Service: April Austin I. 01/14/2022 11:00 A M Medical Record Number: [id number] Patient Account Number: [id number] Date of Birth/Sex: Treating RN: [birth year] (77 y.o. Harlow Ohms Primary Care Provider: Domingo Mend Other Clinician: Referring Provider: Treating Provider/Extender: Gevena Cotton Weeks in Treatment: 16 Debridement Performed for Assessment: Wound #2 Right,Lateral Lower Leg Performed By: Physician [name], MD Debridement Type: Debridement Severity of Tissue Pre Debridement: Fat layer exposed Level of Consciousness (Pre-procedure): Awake and Alert Pre-procedure Verification/Time Out Yes - 11:14 Taken: Start Time: 11:14 Pain Control: Lidocaine 4% T opical Solution T Area Debrided (L x W): otal 5 (cm) x 2.1 (cm) = 10.5  (cm) Tissue and other material debrided: Non-Viable, Slough, Subcutaneous, Slough Level: Skin/Subcutaneous Tissue Debridement Description: Excisional Instrument: Curette Bleeding: Minimum Hemostasis Achieved: Pressure Response to Treatment: Procedure was tolerated well Level of Consciousness (Post- Awake and Alert procedure): Post Debridement Measurements of Total Wound Length: (cm) 5 Width: (cm) 2.1 Depth: (cm) 0.1 Volume: (cm) 0.825 Character of Wound/Ulcer Post Debridement: Improved Severity of Tissue Post Debridement: Fat layer exposed [name], [name] ([id number]) [id number] Page 2 of 11 Post Procedure Diagnosis Same as Pre-procedure Notes scribed for Dr. [name] by Adline Peals, RN Electronic Signature(s) Signed: 01/14/2022 11:45:20 AM By: [name] MD FACS Signed: 01/14/2022 4:13:50 PM By: Sabas Sous By: Adline Peals on 01/14/2022 11:16:28 -------------------------------------------------------------------------------- HPI Details Patient Name: Date of Service: April Austin I. 01/14/2022 11:00 A M Medical Record Number: [id number] Patient Account Number: [id number] Date of Birth/Sex: Treating RN: 17-Aug-1945 (77 y.o. F) Primary Care Provider: Domingo Mend Other Clinician: Referring Provider: Treating Provider/Extender: Gevena Cotton Weeks in Treatment: 16 History of Present Illness HPI Description: ADMISSION 11/15/2020 This is a 77 year old woman with a history of squamous cell cancer skin cancer followed by Dr. Anabel Bene dermatology. She describes having a wound on the right posterior calf dating back since September. She saw her primary doctor in early October who suggested petroleum infused gauze. The patient has been doing this. No real improvement in fact she says this is getting larger. She does not complain much of pain. She is completely uncertain about how this happened there  was no trauma she simply became aware of it at some point describing it is about the size of a quarter but it has been getting progressively larger since then. She is here for review of this. Past medical history Crohn's disease not currently on prednisone, gastroesophageal reflux disease, history of squamous cell skin cancer, history of rheumatoid arthritis, hypertension 11/16; this lady had a large superficial atypical  wound on the right posterior calf. She has significant solar skin damage probably some degree of venous insufficiency but she described a rapidly expanding wound area. Because of this and a history of skin cancer I went ahead and did a shave biopsy but we do not have the results of this at the time we saw her in clinic today. She has been using silver alginate and kerlix 11/30; shave biopsy I did last time did not show any malignancy or fungal elements. Suggestion of a stasis ulcer. With our treatment which is simply been silver alginate with kerlix that she is changing daily the wound is down dramatically almost surprising how rapidly it is reduced in surface area 12/14; patient comes in today with her wound healed this was on the right posterior calf. She has significant solar skin damage probably some degree of venous insufficiency however she described a rapidly expanding wound area on admission. Because of this and a history of skin cancer I did do a shave biopsy but nothing was really found of worry. 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 Unfortunately, the wound has failed to heal. There is thick slough on the wound surface. The periwound skin is in reasonably good condition. There is granulation tissue forming underneath  the slough. 09/28/2021: The wound has deteriorated rather significantly over the past week. She had fairly copious drainage that she noticed as early as Monday, but she did not contact our office. Today there is a lot of periwound skin breakdown and a very pungent odor coming from the wound. There is thick slough on the wound surface but there is still good granulation tissue underlying this. Despite this, however, the wound actually measures smaller. 10/05/2021: The culture that I took grew out MRSA. I prescribed doxycycline and she is still taking this. There has been a tremendous improvement in the wound. It is smaller and has contracted considerably. There is hypertrophic granulation tissue present. No malodorous drainage. 10/16/2021: The wound has contracted further and is epithelializing. The surface is very clean and the hypertrophic granulation tissue has not recurred. She completed her course of doxycycline. 10/19; surgical wound on the right medial lower extremity secondary to Mohs surgery done in August. She is using Hydrofera Blue under 3 layer compression and making excellent progress. 11/02/2021: The wound is nearly completely epithelialized. Just a couple of open areas with some eschar overlying the surface. 11/12/2021: Just 1 small open area remains. It is clean without any slough or eschar accumulation. No hypertrophic granulation tissue present. 11/19/2021: Her wound is healed. READMISSION 12/07/2021 The patient has been moving for the past couple of weeks and has spent a substantial amount of time on her feet. As result, she developed leg swelling and the tissue where her wound had been, which she was protecting with a Band-Aid, broke down considerably. She now has a large wound that is irregular with PERLA, ECHAVARRIA I ([id number]) [id number] Page 3 of 11 extensive slough, involving the fat layer. She also developed a blister on her toe that has slough and  eschar present. She also pointed out lump just above her left knee that she is concerned about, as she does have a history of multiple skin cancers. 12/11; patient comes in the clinic with a  large wound on the right anterior lower leg. This was initially a skin cancer underwent Mohs surgery and was treated in this clinic and healed out on 11/13. The patient states the wound reopened with edema and also trauma from a dressing she was putting on it. 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 She also has a wound on the right second toe. The patient states quite adamantly she will not go back to her previous dermatologist for the skin surgery center in [place] blaming them for the original wound on her right lateral leg. I am not exactly sure of her rationale but at this point she certainly will not consider a referral there 12/18; the patient has an appointment at [facility] on 12/28 although I am not sure exactly who she is seeing. This is in reference to the skin cancer just above her left patella. She would not go back to the skin surgery or indeed her dermatologist in [place]. She had an area on the right second toe this is healed today. Her original surgical site on the right anterior lateral lower leg actually looks some better. I changed her to Iodoflex last week 01/01/2022: The patient has an appointment with plastic surgery on Thursday to evaluate the biopsy-proven squamous cell carcinoma on her left upper leg. The wound on her right lower leg has a thick layer of slough on the surface and has a very atypical appearance. 01/08/2022: The biopsies that I took last week from the wound on her right lower extremity were negative for malignancy but did demonstrate some bacteria along with inflammation. She met with the plastic surgeon on Thursday and she is going to undergo wide local excision of the biopsy-proven squamous cell carcinoma on her left upper  leg. The right lower leg wound is narrower again this week. There is slough and eschar as well as some fibrinous debris in the areas where I took the biopsies and obtained hemostasis with silver nitrate. 01/14/2022: The wound is narrower today, but is beginning to accumulate hypertrophic granulation tissue. Thin layer of slough on the surface. There is still slough and fibrinous debris in the 2 biopsy sites. Electronic Signature(s) Signed: 01/14/2022 11:28:43 AM By: [name] MD FACS Entered By: [name] on 01/14/2022 11:28:43 -------------------------------------------------------------------------------- Physical Exam Details Patient Name: Date of Service: April Austin I. 01/14/2022 11:00 A M Medical Record Number: [id number] Patient Account Number: [id number] Date of Birth/Sex: Treating RN: 1945-03-31 (77 y.o. F) Primary Care Provider: Domingo Mend Other Clinician: Referring Provider: Treating Provider/Extender: Gevena Cotton Weeks in Treatment: 16 Constitutional Slightly hypertensive. . . . no acute distress. Respiratory Normal work of breathing on room air. Notes 01/14/2022: The wound is narrower today, but is beginning to accumulate hypertrophic granulation tissue. Thin layer of slough on the surface. There is still slough and fibrinous debris in the 2 biopsy sites. Electronic Signature(s) Signed: 01/14/2022 11:29:15 AM By: [name] MD FACS Entered By: [name] on 01/14/2022 11:29:15 -------------------------------------------------------------------------------- Physician Orders Details Patient Name: Date of Service: April Austin I. 01/14/2022 11:00 A M Medical Record Number: [id number] Patient Account Number: [id number] Date of Birth/Sex: Treating RN: 05-14-1945 (77 y.o. Harlow Ohms Primary Care Provider: Domingo Mend Other Clinician: STEPHANNY, TSUTSUI I ([id number]) [id number] Page 4  of 11 Referring Provider: Treating Provider/Extender: Caprice Kluver in Treatment: 16 Verbal / Phone Orders: No Diagnosis Coding ICD-10 Coding Code Description Z30.076 Non-pressure chronic  ulcer of other part of right lower leg with fat layer exposed T81.31XS Disruption of external operation (surgical) wound, not elsewhere classified, sequela L98.9 Disorder of the skin and subcutaneous tissue, unspecified C44.722 Squamous cell carcinoma of skin of right lower limb, including hip I10 Essential (primary) hypertension K50.90 Crohn's disease, unspecified, without complications V42 Unspecified severe protein-calorie malnutrition Follow-up Appointments ppointment in 1 week. - Dr. [name] Rm 1 Return A Anesthetic (In clinic) Topical Lidocaine 4% applied to wound bed Bathing/ Shower/ Hygiene May shower with protection but do not get wound dressing(s) wet. Protect dressing(s) with water repellant cover (for example, large plastic bag) or a cast cover and may then take shower. Edema Control - Lymphedema / SCD / Other Avoid standing for long periods of time. Moisturize legs daily. Wound Treatment Wound #2 - Lower Leg Wound Laterality: Right, Lateral Cleanser: Soap and Water 1 x Per Week/30 Days Discharge Instructions: May shower and wash wound with dial antibacterial soap and water prior to dressing change. Cleanser: Wound Cleanser 1 x Per Week/30 Days Discharge Instructions: Cleanse the wound with wound cleanser prior to applying a clean dressing using gauze sponges, not tissue or cotton balls. Peri-Wound Care: Sween Lotion (Moisturizing lotion) 1 x Per Week/30 Days Discharge Instructions: Apply moisturizing lotion as directed Topical: Mupirocin Ointment 1 x Per Week/30 Days Discharge Instructions: Apply Mupirocin (Bactroban) as instructed Prim Dressing: Hydrofera Blue Ready Transfer Foam, 2.5x2.5 (in/in) 1 x Per Week/30 Days ary Discharge Instructions: Apply  directly to wound bed as directed Secondary Dressing: Zetuvit Plus 4x8 in 1 x Per Week/30 Days Discharge Instructions: Apply over primary dressing as directed. Secured With: Transpore Surgical Tape, 2x10 (in/yd) 1 x Per Week/30 Days Discharge Instructions: Secure dressing with tape as directed. Compression Wrap: ThreePress (3 layer compression wrap) 1 x Per Week/30 Days Discharge Instructions: Apply three layer compression as directed. Patient Medications llergies: Demerol, latex, aspirin, ciprofloxacin, codeine, Septra, lactose, adhesive tape, fentanyl A Notifications Medication Indication Start End 01/14/2022 lidocaine DOSE topical 4 % cream - cream topical Electronic Signature(s) Signed: 01/14/2022 11:45:20 AM By: [name] MD FACS Entered By: [name] on 01/14/2022 11:29:26 Fiebelkorn, Parks Ranger ([id number]) [id number] Page 5 of 11 -------------------------------------------------------------------------------- Problem List Details Patient Name: Date of Service: [name], [name]. 01/14/2022 11:00 A M Medical Record Number: [id number] Patient Account Number: [id number] Date of Birth/Sex: Treating RN: 1945/06/21 (77 y.o. F) Primary Care Provider: Domingo Mend Other Clinician: Referring Provider: Treating Provider/Extender: Caprice Kluver in Treatment: 16 Active Problems ICD-10 Encounter Code Description Active Date MDM Diagnosis L97.812 Non-pressure chronic ulcer of other part of right lower leg with fat layer 09/20/2021 No Yes exposed T81.31XS Disruption of external operation (surgical) wound, not elsewhere classified, 09/20/2021 No Yes sequela L98.9 Disorder of the skin and subcutaneous tissue, unspecified 12/07/2021 No Yes C44.722 Squamous cell carcinoma of skin of right lower limb, including hip 09/20/2021 No Yes I10 Essential (primary) hypertension 09/20/2021 No Yes K50.90 Crohn's disease, unspecified, without  complications 04/22/6061 No Yes E43 Unspecified severe protein-calorie malnutrition 09/20/2021 No Yes Inactive Problems ICD-10 Code Description Active Date Inactive Date L97.512 Non-pressure chronic ulcer of other part of right foot with fat layer exposed 12/07/2021 12/07/2021 Resolved Problems Electronic Signature(s) Signed: 01/14/2022 11:25:15 AM By: [name] MD FACS Entered By: [name] on 01/14/2022 11:25:15 Progress Note Details -------------------------------------------------------------------------------- [name] ([id number]) [id number] Page 6 of 11 Patient Name: Date of Service: [name], [name]. 01/14/2022 11:00 A M Medical Record Number: [id number]  Patient Account Number: [id number] Date of Birth/Sex: Treating RN: 07/09/1945 (77 y.o. F) Primary Care Provider: Domingo Mend Other Clinician: Referring Provider: Treating Provider/Extender: Caprice Kluver in Treatment: 16 Subjective Chief Complaint Information obtained from Patient 11/15/2020; patient is here for review of a wound area on the right posterior calf 09/20/2021: Patient is here for a nonhealing wound after undergoing Mohs surgery on her right lower leg. 12/07/2021: Reopening of skin at site of previous wound, new wound on right toe, concerning lesion on left upper leg just above knee History of Present Illness (HPI) ADMISSION 11/15/2020 This is a 77 year old woman with a history of squamous cell cancer skin cancer followed by Dr. Anabel Bene dermatology. She describes having a wound on the right posterior calf dating back since September. She saw her primary doctor in early October who suggested petroleum infused gauze. The patient has been doing this. No real improvement in fact she says this is getting larger. She does not complain much of pain. She is completely uncertain about how this happened there was no trauma she simply became aware of  it at some point describing it is about the size of a quarter but it has been getting progressively larger since then. She is here for review of this. Past medical history Crohn's disease not currently on prednisone, gastroesophageal reflux disease, history of squamous cell skin cancer, history of rheumatoid arthritis, hypertension 11/16; this lady had a large superficial atypical wound on the right posterior calf. She has significant solar skin damage probably some degree of venous insufficiency but she described a rapidly expanding wound area. Because of this and a history of skin cancer I went ahead and did a shave biopsy but we do not have the results of this at the time we saw her in clinic today. She has been using silver alginate and kerlix 11/30; shave biopsy I did last time did not show any malignancy or fungal elements. Suggestion of a stasis ulcer. With our treatment which is simply been silver alginate with kerlix that she is changing daily the wound is down dramatically almost surprising how rapidly it is reduced in surface area 12/14; patient comes in today with her wound healed this was on the right posterior calf. She has significant solar skin damage probably some degree of venous insufficiency however she described a rapidly expanding wound area on admission. Because of this and a history of skin cancer I did do a shave biopsy but nothing was really found of worry. 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 Unfortunately, the wound has failed to heal. There is thick slough on the wound surface. The periwound skin is in reasonably good condition. There is granulation tissue forming underneath the slough. 09/28/2021: The wound has  deteriorated rather significantly over the past week. She had fairly copious drainage that she noticed as early as Monday, but she did not contact our office. Today there is a lot of periwound skin breakdown and a very pungent odor coming from the wound. There is thick slough on the wound surface but there is still good granulation tissue underlying this. Despite this, however, the wound actually measures smaller. 10/05/2021: The culture that I took grew out MRSA. I prescribed doxycycline and she is  still taking this. There has been a tremendous improvement in the wound. It is smaller and has contracted considerably. There is hypertrophic granulation tissue present. No malodorous drainage. 10/16/2021: The wound has contracted further and is epithelializing. The surface is very clean and the hypertrophic granulation tissue has not recurred. She completed her course of doxycycline. 10/19; surgical wound on the right medial lower extremity secondary to Mohs surgery done in August. She is using Hydrofera Blue under 3 layer compression and making excellent progress. 11/02/2021: The wound is nearly completely epithelialized. Just a couple of open areas with some eschar overlying the surface. 11/12/2021: Just 1 small open area remains. It is clean without any slough or eschar accumulation. No hypertrophic granulation tissue present. 11/19/2021: Her wound is healed. READMISSION 12/07/2021 The patient has been moving for the past couple of weeks and has spent a substantial amount of time on her feet. As result, she developed leg swelling and the tissue where her wound had been, which she was protecting with a Band-Aid, broke down considerably. She now has a large wound that is irregular with extensive slough, involving the fat layer. She also developed a blister on her toe that has slough and eschar present. She also pointed out lump just above her left knee that she is concerned about, as she does have a  history of multiple skin cancers. 12/11; patient comes in the clinic with a large wound on the right anterior lower leg. This was initially a skin cancer underwent Mohs surgery and was treated in this clinic and healed out on 11/13. The patient states the wound reopened with edema and also trauma from a dressing she was putting on it. 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 She also has a wound on the right second toe. The patient states quite adamantly she will not go back to her previous dermatologist for the skin surgery center in [place] blaming them for the original wound on her right lateral leg. I am not exactly sure of her rationale but at this point she certainly will not consider a referral there 12/18; the patient has an appointment at [facility] on 12/28 although I am not sure exactly who she is seeing. This is in reference to the skin cancer just above her left patella. She would not go back to the skin surgery or indeed her dermatologist in [place]. She had an area on the right second toe this is healed today. Her original surgical site on the right anterior lateral lower leg actually looks some better. I changed her to Iodoflex last week 01/01/2022: The patient has an appointment with plastic surgery on Thursday to evaluate the biopsy-proven squamous cell carcinoma on her left upper leg. The wound on her right lower leg has a thick layer of slough on the surface and has a very atypical appearance. KYNISHA, MEMON I ([id number]) [id number] Page 7 of 11 01/08/2022: The biopsies that I took last week from the wound on her right lower extremity were negative for malignancy but did demonstrate some bacteria along with inflammation. She met with the plastic surgeon on Thursday and she is going to undergo wide local excision of the biopsy-proven squamous cell carcinoma on her left upper leg. The right lower leg wound is  narrower again this week. There is slough and eschar as well as some fibrinous debris in the areas where I took the biopsies and obtained hemostasis with silver nitrate. 01/14/2022: The wound is narrower today, but  is beginning to accumulate hypertrophic granulation tissue. Thin layer of slough on the surface. There is still slough and fibrinous debris in the 2 biopsy sites. Patient History Information obtained from Patient. Family History Diabetes - Father, Heart Disease - Father, Hypertension - Father, No family history of Cancer, Hereditary Spherocytosis, Kidney Disease, Lung Disease, Seizures, Stroke, Thyroid Problems, Tuberculosis. Social History Former smoker - quit 30 years ago, Marital Status - Married, Alcohol Use - Never, Drug Use - No History, Caffeine Use - Rarely. Medical History Eyes Patient has history of Cataracts - Had cataract surgery Denies history of Glaucoma, Optic Neuritis Ear/Nose/Mouth/Throat Patient has history of Chronic sinus problems/congestion - Post Nasal drip Denies history of Middle ear problems Hematologic/Lymphatic Denies history of Anemia, Hemophilia, Human Immunodeficiency Virus, Lymphedema, Sickle Cell Disease Respiratory Denies history of Aspiration, Asthma, Chronic Obstructive Pulmonary Disease (COPD), Pneumothorax, Sleep Apnea, Tuberculosis Cardiovascular Patient has history of Hypertension Gastrointestinal Patient has history of Crohnoos - Has Chrons Disease Denies history of Cirrhosis , Colitis, Hepatitis A, Hepatitis B, Hepatitis C Endocrine Denies history of Type I Diabetes, Type II Diabetes Genitourinary Denies history of End Stage Renal Disease Immunological Denies history of Lupus Erythematosus, Raynaudoos, Scleroderma Musculoskeletal Patient has history of Osteoarthritis - Knees Denies history of Gout, Rheumatoid Arthritis, Osteomyelitis Neurologic Denies history of Dementia, Neuropathy, Quadriplegia, Paraplegia, Seizure  Disorder Oncologic Denies history of Received Chemotherapy, Received Radiation Psychiatric Denies history of Anorexia/bulimia, Confinement Anxiety Hospitalization/Surgery History - bil TKA. - Moh's procedure right leg 08/17/21. - bil cataract extractions. - nasal fx surgery. Medical A Surgical History Notes nd Gastrointestinal Has a large hiatal hernia. Has Chrons Disease., GERD, pancreatic insufficiency Endocrine Pancreatic Insufficiency Genitourinary stage 3 Chronic Kidney disease Integumentary (Skin) Had Squamous Cell (skin cancer). Had a skin graft on Right knee (area) Musculoskeletal Had Bil knee replacements, hx cervical spine fx, sternal fx, thoracis spine fx Neurologic left foot drop Oncologic Had Squamous Cell (skin cell) Objective Constitutional Slightly hypertensive. no acute distress. Vitals Time Taken: 10:57 AM, Height: 60 in, Weight: 101 lbs, BMI: 19.7, Temperature: 97.8 F, Pulse: 89 bpm, Respiratory Rate: 16 breaths/min, Blood Pressure: 147/77 mmHg. Respiratory Normal work of breathing on room air. SHAWNAE, LEIVA I ([id number]) [id number] Page 8 of 11 General Notes: 01/14/2022: The wound is narrower today, but is beginning to accumulate hypertrophic granulation tissue. Thin layer of slough on the surface. There is still slough and fibrinous debris in the 2 biopsy sites. Integumentary (Hair, Skin) Wound #2 status is Open. Original cause of wound was Surgical Injury. The date acquired was: 08/17/2021. The wound has been in treatment 16 weeks. The wound is located on the Right,Lateral Lower Leg. The wound measures 5cm length x 2.1cm width x 0.1cm depth; 8.247cm^2 area and 0.825cm^3 volume. There is Fat Layer (Subcutaneous Tissue) exposed. There is no tunneling or undermining noted. There is a large amount of serosanguineous drainage noted. The wound margin is distinct with the outline attached to the wound base. There is large (67-100%) red  granulation within the wound bed. There is a small (1-33%) amount of necrotic tissue within the wound bed including Adherent Slough. The periwound skin appearance had no abnormalities noted for texture. The periwound skin appearance had no abnormalities noted for moisture. The periwound skin appearance had no abnormalities noted for color. Periwound temperature was noted as No Abnormality. 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 There was a Excisional Skin/Subcutaneous Tissue Debridement with a total area of 10.5 sq cm performed by [name], MD. With the following instrument(s): Curette to remove Non-Viable tissue/material. Material removed includes Subcutaneous Tissue and Slough and after achieving pain control using Lidocaine 4% T opical Solution. No specimens were taken. A time out was conducted at 11:14, prior to the start of the procedure. A Minimum amount of bleeding was controlled with Pressure. The procedure was tolerated well. Post Debridement Measurements: 5cm length x 2.1cm width x 0.1cm depth; 0.825cm^3 volume. Character of Wound/Ulcer Post Debridement is improved. Severity of Tissue Post Debridement is: Fat layer exposed. Post procedure Diagnosis Wound #2: Same as Pre-Procedure General Notes: scribed for Dr. [name] by Adline Peals, RN. Pre-procedure diagnosis of Wound #2 is an  Open Surgical Wound located on the Right,Lateral Lower Leg . There was a Three Layer Compression Therapy Procedure by Adline Peals, RN. Post procedure Diagnosis Wound #2: Same as Pre-Procedure Plan Follow-up Appointments: Return Appointment in 1 week. - Dr. [name] Rm 1 Anesthetic: (In clinic) Topical Lidocaine 4% applied to wound bed Bathing/ Shower/ Hygiene: May shower with protection but do not get wound dressing(s) wet. Protect dressing(s) with water repellant cover (for example, large plastic bag) or a cast cover and may then take shower. Edema Control - Lymphedema / SCD / Other: Avoid standing for long periods of time. Moisturize legs daily. The following medication(s) was prescribed: lidocaine topical 4 % cream cream topical was prescribed at facility WOUND #2: - Lower Leg Wound Laterality: Right, Lateral Cleanser: Soap and Water 1 x Per Week/30 Days Discharge Instructions: May shower and wash wound with dial antibacterial soap and water prior to dressing change. Cleanser: Wound Cleanser 1 x Per Week/30 Days Discharge Instructions: Cleanse the wound with wound cleanser prior to applying a clean dressing using gauze sponges, not tissue or cotton balls. Peri-Wound Care: Sween Lotion (Moisturizing lotion) 1 x Per Week/30 Days Discharge Instructions: Apply moisturizing lotion as directed Topical: Mupirocin Ointment 1 x Per Week/30 Days Discharge Instructions: Apply Mupirocin (Bactroban) as instructed Prim Dressing: Hydrofera Blue Ready Transfer Foam, 2.5x2.5 (in/in) 1 x Per Week/30 Days ary Discharge Instructions: Apply directly to wound bed as directed Secondary Dressing: Zetuvit Plus 4x8 in 1 x Per Week/30 Days Discharge Instructions: Apply over primary dressing as directed. Secured With: Transpore Surgical T ape, 2x10 (in/yd) 1 x Per Week/30 Days Discharge Instructions: Secure dressing with tape as directed. Com pression Wrap: ThreePress (3 layer compression wrap) 1 x Per  Week/30 Days Discharge Instructions: Apply three layer compression as directed. MALLARY, KREGER I ([id number]) [id number] Page 9 of 11 01/14/2022: The wound is narrower today, but is beginning to accumulate hypertrophic granulation tissue. Thin layer of slough on the surface. There is still slough and fibrinous debris in the 2 biopsy sites. I used a curette to debride slough and nonviable subcutaneous tissue from the wound. I am going to change her dressing to [facility] to try and combat the hypertrophic granulation tissue. Continue topical mupirocin and 3 layer compression. Follow-up in 1 week. Electronic Signature(s) Signed: 01/14/2022 11:30:03 AM By: [name] MD FACS Entered By: [name] on 01/14/2022 11:30:03 -------------------------------------------------------------------------------- HxROS  Details Patient Name: Date of Service: [name], [name] 01/14/2022 11:00 A M Medical Record Number: [id number] Patient Account Number: [id number] Date of Birth/Sex: Treating RN: 02/23/1945 (77 y.o. F) Primary Care Provider: Domingo Mend Other Clinician: Referring Provider: Treating Provider/Extender: Caprice Kluver in Treatment: 16 Information Obtained From Patient Eyes Medical History: Positive for: Cataracts - Had cataract surgery Negative for: Glaucoma; Optic Neuritis Ear/Nose/Mouth/Throat Medical History: Positive for: Chronic sinus problems/congestion - Post Nasal drip Negative for: Middle ear problems Hematologic/Lymphatic Medical History: Negative for: Anemia; Hemophilia; Human Immunodeficiency Virus; Lymphedema; Sickle Cell Disease Respiratory Medical History: Negative for: Aspiration; Asthma; Chronic Obstructive Pulmonary Disease (COPD); Pneumothorax; Sleep Apnea; Tuberculosis Cardiovascular Medical History: Positive for: Hypertension Gastrointestinal Medical History: Positive for: Crohns - Has  Chrons Disease Negative for: Cirrhosis ; Colitis; Hepatitis A; Hepatitis B; Hepatitis C Past Medical History Notes: Has a large hiatal hernia. Has Chrons Disease., GERD, pancreatic insufficiency Endocrine Medical History: Negative for: Type I Diabetes; Type II Diabetes Past Medical History Notes: Pancreatic Insufficiency Genitourinary ZURIEL, YEAMAN I ([id number]) [id number] Page 10 of 11 Medical History: Negative for: End Stage Renal Disease Past Medical History Notes: stage 3 Chronic Kidney disease Immunological Medical History: Negative for: Lupus Erythematosus; Raynauds; Scleroderma Integumentary (Skin) Medical History: Past Medical History Notes: Had Squamous Cell (skin cancer). Had a skin graft on Right knee (area) Musculoskeletal Medical History: Positive for: Osteoarthritis - Knees Negative for: Gout; Rheumatoid Arthritis; Osteomyelitis Past Medical History Notes: Had Bil knee replacements, hx cervical spine fx, sternal fx, thoracis spine fx Neurologic Medical History: Negative for: Dementia; Neuropathy; Quadriplegia; Paraplegia; Seizure Disorder Past Medical History Notes: left foot drop Oncologic Medical History: Negative for: Received Chemotherapy; Received Radiation Past Medical History Notes: Had Squamous Cell (skin cell) Psychiatric Medical History: Negative for: Anorexia/bulimia; Confinement Anxiety HBO Extended History Items Ear/Nose/Mouth/Throat: Eyes: Chronic sinus Cataracts problems/congestion Immunizations Pneumococcal Vaccine: Received Pneumococcal Vaccination: Yes Received Pneumococcal Vaccination On or After 60th Birthday: Yes Tetanus Vaccine: Last tetanus shot: 11/16/2011 Implantable Devices None Hospitalization / Surgery History Type of Hospitalization/Surgery bil TKA Moh's procedure right leg 08/17/21 bil cataract extractions nasal fx surgery Family and Social History Cancer: No; Diabetes: Yes - Father;  Heart Disease: Yes - Father; Hereditary Spherocytosis: No; Hypertension: Yes - Father; Kidney Disease: No; Lung Disease: No; Seizures: No; Stroke: No; Thyroid Problems: No; Tuberculosis: No; Former smoker - quit 30 years ago; Marital Status - Married; Alcohol Use: Never; Drug Use: No History; Caffeine Use: Rarely; Financial Concerns: No; Food, Clothing or Shelter Needs: No; Support System Lacking: No; Transportation Concerns: No Electronic Signature(s) Signed: 01/14/2022 11:45:20 AM By: [name] MD FACS Entered By: [name] on 01/14/2022 11:28:50 [place], Parks Ranger ([id number]) [id number] Page 11 of 11 -------------------------------------------------------------------------------- SuperBill Details Patient Name: Date of Service: [name], [name] 01/14/2022 Medical Record Number: [id number] Patient Account Number: [id number] Date of Birth/Sex: Treating RN: July 12, 1945 (77 y.o. 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, including hip I10 Essential (primary) hypertension K50.90 Crohn's disease, unspecified, without complications B76 Unspecified severe protein-calorie malnutrition Facility Procedures : CPT4 Code: [id number] Description: [facility] - DEB SUBQ TISSUE  20 SQ CM/< ICD-10 Diagnosis Description L97.812 Non-pressure chronic ulcer of other part of right lower leg with fat layer exp Modifier: osed Quantity: 1 Physician Procedures : CPT4 Code Description Modifier [id number]  [id number] - WC PHYS LEVEL 4 - EST PT 25 ICD-10 Diagnosis Description Y81.448 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d T81.31XS Disruption of external operation (surgical)  wound, not elsewhere classified, sequela E43 Unspecified severe protein-calorie malnutrition I10 Essential (primary) hypertension Quantity: 1 : [id number] - WC PHYS SUBQ TISS 20 SQ CM ICD-10 Diagnosis Description L97.812 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d Quantity: 1 Electronic Signature(s) Signed: 01/14/2022 11:31:56 AM By: [name] MD FACS Entered By: [name] on 01/14/2022 11:31:56

## 2022-01-15 ENCOUNTER — Encounter (HOSPITAL_BASED_OUTPATIENT_CLINIC_OR_DEPARTMENT_OTHER)
Admission: RE | Admit: 2022-01-15 | Discharge: 2022-01-15 | Disposition: A | Discharge status: Home / Self Care | Payer: Medicare Other | Source: Ambulatory Visit | Attending: Plastic Surgery | Admitting: Plastic Surgery

## 2022-01-15 DIAGNOSIS — I10 Essential (primary) hypertension: Secondary | ICD-10-CM | POA: Diagnosis not present

## 2022-01-15 DIAGNOSIS — Z01812 Encounter for preprocedural laboratory examination: Secondary | ICD-10-CM | POA: Diagnosis not present

## 2022-01-15 LAB — BASIC METABOLIC PANEL
Anion gap: 10 (ref 5–15)
BUN: 27 mg/dL — ABNORMAL HIGH (ref 8–23)
CO2: 22 mmol/L (ref 22–32)
Calcium: 8.9 mg/dL (ref 8.9–10.3)
Chloride: 104 mmol/L (ref 98–111)
Creatinine, Ser: 1.05 mg/dL — ABNORMAL HIGH (ref 0.44–1.00)
GFR, Estimated: 55 mL/min — ABNORMAL LOW (ref >60)
Glucose, Bld: 75 mg/dL (ref 70–99)
Potassium: 4.6 mmol/L (ref 3.5–5.1)
Sodium: 136 mmol/L (ref 135–145)

## 2022-01-15 NOTE — Progress Notes (Signed)

## 2022-01-15 NOTE — Progress Notes (Signed)
NATASHA, BURDA [name] ([id number]) [id number] Page 1 of 8 Visit Report for 01/14/2022 Arrival Information Details Patient Name: Date of Service: [name], [name]. 01/14/2022 11:00 A M Medical Record Number: [id number] Patient Account Number: [id number] Date of Birth/Sex: Treating RN: 29-Nov-1945 (77 y.o. Iver Nestle, [place] Primary Care Lizvette Lightsey: Domingo Mend Other Clinician: Referring Citlally Captain: Treating Cecilio Ohlrich/Extender: Caprice Kluver in Treatment: 16 Visit Information History Since Last Visit Added or deleted any medications: No Patient Arrived: Ambulatory Any new allergies or adverse reactions: No Arrival Time: 10:55 Had a fall or experienced change in No Accompanied By: self activities of daily living that may affect Transfer Assistance: None risk of falls: Patient Identification Verified: Yes Signs or symptoms of abuse/neglect since last visito No Secondary Verification Process Completed: Yes Hospitalized since last visit: No Patient Requires Transmission-Based Precautions: No Implantable device outside of the clinic excluding No Patient Has Alerts: No cellular tissue based products placed in the center since last visit: Has Compression in Place as Prescribed: Yes Pain Present Now: Yes Electronic Signature(s) Signed: 01/14/2022 5:23:33 PM By: Blanche East RN Entered By: Blanche East on 01/14/2022 10:57:31 -------------------------------------------------------------------------------- Compression Therapy Details Patient Name: Date of Service: April Austin [name]. 01/14/2022 11:00 A M Medical Record Number: [id number] Patient Account Number: [id number] Date of Birth/Sex: Treating RN: [birth year] (77 y.o. Harlow Ohms Primary Care Annica Marinello: Domingo Mend Other Clinician: Referring Ayane Delancey: Treating Guss Farruggia/Extender: Gevena Cotton Weeks in Treatment: 16 Compression Therapy Performed for  Wound Assessment: Wound #2 Right,Lateral Lower Leg Performed By: Clinician Adline Peals, RN Compression Type: Three Layer Post Procedure Diagnosis Same as Pre-procedure Electronic Signature(s) Signed: 01/14/2022 4:13:50 PM By: Adline Peals Entered By: Adline Peals on 01/14/2022 11:16:41 Clippinger, Parks Ranger ([id number]) [id number] Page 2 of 8 -------------------------------------------------------------------------------- Encounter Discharge Information Details Patient Name: Date of Service: [name], [name] 01/14/2022 11:00 A M Medical Record Number: [id number] Patient Account Number: [id number] Date of Birth/Sex: Treating RN: [birth year] (77 y.o. Harlow Ohms Primary Care Charlina Dwight: Domingo Mend Other Clinician: Referring Jordon Kristiansen: Treating Anntionette Madkins/Extender: Gevena Cotton Weeks in Treatment: 16 Encounter Discharge Information Items Post Procedure Vitals Discharge Condition: Stable Temperature (F): 97.8 Ambulatory Status: Ambulatory Pulse (bpm): 89 Discharge Destination: Home Respiratory Rate (breaths/min): 16 Transportation: Private Auto Blood Pressure (mmHg): 147/77 Accompanied By: self Schedule Follow-up Appointment: Yes Clinical Summary of Care: Patient Declined Electronic Signature(s) Signed: 01/14/2022 11:29:47 AM By: Blanche East RN Entered By: Blanche East on 01/14/2022 11:29:47 -------------------------------------------------------------------------------- Lower Extremity Assessment Details Patient Name: Date of Service: April Austin [name]. 01/14/2022 11:00 A M Medical Record Number: [id number] Patient Account Number: [id number] Date of Birth/Sex: Treating RN: 1945-06-18 (77 y.o. Harlow Ohms Primary Care Asyria Kolander: Domingo Mend Other Clinician: Referring Anita Mcadory: Treating Huck Ashworth/Extender: Gevena Cotton Weeks in Treatment: 16 Edema Assessment Assessed:  [Left: No] [Right: No] Edema: [Left: N] [Right: o] Calf Left: Right: Point of Measurement: From Medial Instep 28 cm 28 cm Ankle Left: Right: Point of Measurement: From Medial Instep 19 cm 18 cm Vascular Assessment Pulses: Dorsalis Pedis Palpable: [Right:Yes] Electronic Signature(s) Signed: 01/14/2022 4:13:50 PM By: Adline Peals Entered By: Adline Peals on 01/14/2022 11:03:19 Multi Wound Chart Details -------------------------------------------------------------------------------- [name] ([id number]) [id number] Page 3 of 8 Patient Name: Date of Service: [name], [name]. 01/14/2022 11:00 A M Medical Record Number: [id number] Patient Account Number: [id number] Date of Birth/Sex: Treating RN: 05-25-1945 (77 y.o. F) Primary Care Dezmen Alcock:  Domingo Mend Other Clinician: Referring Ollie Esty: Treating Tee Richeson/Extender: Gevena Cotton Weeks in Treatment: 16 Vital Signs Height(in): 60 Pulse(bpm): 89 Weight(lbs): 101 Blood Pressure(mmHg): 147/77 Body Mass Index(BMI): 19.7 Temperature(F): 97.8 Respiratory Rate(breaths/min): 16 Wound Assessments Wound Number: 2 N/A N/A Photos: N/A N/A Right, Lateral Lower Leg N/A N/A Wound Location: Surgical Injury N/A N/A Wounding Event: Open Surgical Wound N/A N/A Primary Etiology: Venous Leg Ulcer N/A N/A Secondary Etiology: Cataracts, Chronic sinus N/A N/A Comorbid History: problems/congestion, Hypertension, Crohns, Osteoarthritis 08/17/2021 N/A N/A Date Acquired: 16 N/A N/A Weeks of Treatment: Open N/A N/A Wound Status: No N/A N/A Wound Recurrence: Yes N/A N/A Clustered Wound: 5x2.1x0.1 N/A N/A Measurements L x W x D (cm) 8.247 N/A N/A A (cm) : rea 0.825 N/A N/A Volume (cm) : 54.50% N/A N/A % Reduction in A rea: 54.50% N/A N/A % Reduction in Volume: Full Thickness Without Exposed N/A N/A Classification: Support Structures Large N/A N/A Exudate A  mount: Serosanguineous N/A N/A Exudate Type: red, brown N/A N/A Exudate Color: Distinct, outline attached N/A N/A Wound Margin: Large (67-100%) N/A N/A Granulation A mount: Red N/A N/A Granulation Quality: Small (1-33%) N/A N/A Necrotic A mount: Fat Layer (Subcutaneous Tissue): Yes N/A N/A Exposed Structures: Fascia: No Tendon: No Muscle: No Joint: No Bone: No Medium (34-66%) N/A N/A Epithelialization: Debridement - Excisional N/A N/A Debridement: Pre-procedure Verification/Time Out 11:14 N/A N/A Taken: Lidocaine 4% Topical Solution N/A N/A Pain Control: Subcutaneous, Slough N/A N/A Tissue Debrided: Skin/Subcutaneous Tissue N/A N/A Level: 10.5 N/A N/A Debridement A (sq cm): rea Curette N/A N/A Instrument: Minimum N/A N/A Bleeding: Pressure N/A N/A Hemostasis A chieved: Procedure was tolerated well N/A N/A Debridement Treatment Response: 5x2.1x0.1 N/A N/A Post Debridement Measurements L x W x D (cm) 0.825 N/A N/A Post Debridement Volume: (cm) Excoriation: No N/A N/A Periwound Skin Texture: Induration: No Callus: No Crepitus: No Rash: No Scarring: No Dry/Scaly: No N/A N/A Periwound Skin Moisture: Atrophie Blanche: No N/A N/A Periwound Skin Color: Cyanosis: No Ecchymosis: No ARTESHA, WEMHOFF [name] ([id number]) [id number] Page 4 of 8 Erythema: No Hemosiderin Staining: No Mottled: No Pallor: No Rubor: No No Abnormality N/A N/A Temperature: Compression Therapy N/A N/A Procedures Performed: Debridement Treatment Notes Electronic Signature(s) Signed: 01/14/2022 11:27:33 AM By: [name] MD FACS Entered By: [name] on 01/14/2022 11:27:32 -------------------------------------------------------------------------------- Multi-Disciplinary Care Plan Details Patient Name: Date of Service: April Austin [name]. 01/14/2022 11:00 A M Medical Record Number: [id number] Patient Account Number: [id number] Date of Birth/Sex:  Treating RN: December 26, 1945 (77 y.o. Harlow Ohms Primary Care Udell Mazzocco: Domingo Mend Other Clinician: Referring Adamae Ricklefs: Treating Narda Fundora/Extender: Caprice Kluver in Treatment: 16 Multidisciplinary Care Plan reviewed with physician Active Inactive Abuse / Safety / Falls / Self Care Management Nursing Diagnoses: History of Falls Potential for falls Goals: Patient will not experience any injury related to falls Date Initiated: 12/07/2021 Target Resolution Date: 02/01/2022 Goal Status: Active Patient/caregiver will verbalize/demonstrate measures taken to prevent injury and/or falls Date Initiated: 09/20/2021 Date Inactivated: 10/19/2021 Target Resolution Date: 10/18/2021 Goal Status: Met Interventions: Assess fall risk on admission and as needed Assess impairment of mobility on admission and as needed per policy Notes: Pressure Nursing Diagnoses: Knowledge deficit related to causes and risk factors for pressure ulcer development Goals: Patient will remain free of pressure ulcers Date Initiated: 12/07/2021 Target Resolution Date: 02/01/2022 Goal Status: Active Interventions: Assess potential for pressure ulcer upon admission and as needed Notes: Electronic Signature(s) Signed: 01/14/2022 4:13:50 PM By: Pollie Friar,  Loney Loh By: Adline Peals on 01/14/2022 11:09:44 Rossmann, Dorothyann Gibbs [name] ([id number]) [id number] Page 5 of 8 -------------------------------------------------------------------------------- Pain Assessment Details Patient Name: Date of Service: [name], [name]. 01/14/2022 11:00 A M Medical Record Number: [id number] Patient Account Number: [id number] Date of Birth/Sex: Treating RN: 12/08/1945 (77 y.o. [name] Primary Care Ray Gervasi: Domingo Mend Other Clinician: Referring Carolyna Yerian: Treating Kekai Geter/Extender: Gevena Cotton Weeks in Treatment: 16 Active  Problems Location of Pain Severity and Description of Pain Patient Has Paino Yes Site Locations Rate the pain. Current Pain Level: 4 Character of Pain Describe the Pain: Burning Pain Management and Medication Current Pain Management: Electronic Signature(s) Signed: 01/14/2022 5:23:33 PM By: Blanche East RN Entered By: Blanche East on 01/14/2022 10:58:42 -------------------------------------------------------------------------------- Patient/Caregiver Education Details Patient Name: Date of Service: April Austin [name]. 1/8/2024andnbsp11:00 [place] Record Number: [id number] Patient Account Number: [id number] Date of Birth/Gender: Treating RN: 06-16-1945 (77 y.o. Harlow Ohms Primary Care Physician: Domingo Mend Other Clinician: Referring Physician: Treating Physician/Extender: Caprice Kluver in Treatment: 16 Education Assessment Education Provided To: Patient [name], [name] ([id number]) [id number] Page 6 of 8 Education Topics Provided Wound Debridement: Methods: Explain/Verbal Responses: Reinforcements needed, State content correctly Electronic Signature(s) Signed: 01/14/2022 4:13:50 PM By: Adline Peals Entered By: Adline Peals on 01/14/2022 11:10:17 -------------------------------------------------------------------------------- Wound Assessment Details Patient Name: Date of Service: April Austin [name]. 01/14/2022 11:00 A M Medical Record Number: [id number] Patient Account Number: [id number] Date of Birth/Sex: Treating RN: [birth year] (77 y.o. Harlow Ohms Primary Care Leena Tiede: Domingo Mend Other Clinician: Referring Samie Reasons: Treating Tammara Massing/Extender: Gevena Cotton Weeks in Treatment: 16 Wound Status Wound Number: 2 Primary Open Surgical Wound Etiology: Wound Location: Right, Lateral Lower Leg Secondary Venous Leg Ulcer Wounding Event: Surgical  Injury Etiology: Date Acquired: 08/17/2021 Wound Status: Open Weeks Of Treatment: 16 Comorbid Cataracts, Chronic sinus problems/congestion, Hypertension, Clustered Wound: Yes History: Crohns, Osteoarthritis Photos Wound Measurements Length: (cm) 5 Width: (cm) 2.1 Depth: (cm) 0.1 Area: (cm) 8.247 Volume: (cm) 0.825 % Reduction in Area: 54.5% % Reduction in Volume: 54.5% Epithelialization: Medium (34-66%) Tunneling: No Undermining: No Wound Description Classification: Full Thickness Without Exposed Suppor Wound Margin: Distinct, outline attached Exudate Amount: Large Exudate Type: Serosanguineous Exudate Color: red, brown t Structures Foul Odor After Cleansing: No Slough/Fibrino Yes Wound Bed Granulation Amount: Large (67-100%) Exposed Structure Granulation Quality: Red Fascia Exposed: No Necrotic Amount: Small (1-33%) Fat Layer (Subcutaneous Tissue) Exposed: Yes Necrotic Quality: Adherent Slough Tendon Exposed: No Muscle Exposed: No Joint Exposed: No Bone Exposed: No [name], [name] ([id number]) [id number] Page 7 of 8 Periwound Skin Texture Texture Color No Abnormalities Noted: Yes No Abnormalities Noted: Yes Moisture Temperature / Pain No Abnormalities Noted: Yes Temperature: No Abnormality Treatment Notes Wound #2 (Lower Leg) Wound Laterality: Right, Lateral Cleanser Soap and Water Discharge Instruction: May shower and wash wound with dial antibacterial soap and water prior to dressing change. Wound Cleanser Discharge Instruction: Cleanse the wound with wound cleanser prior to applying a clean dressing using gauze sponges, not tissue or cotton balls. Peri-Wound Care Sween Lotion (Moisturizing lotion) Discharge Instruction: Apply moisturizing lotion as directed Topical Mupirocin Ointment Discharge Instruction: Apply Mupirocin (Bactroban) as instructed Primary Dressing Hydrofera Blue Ready Transfer Foam, 2.5x2.5 (in/in) Discharge  Instruction: Apply directly to wound bed as directed Secondary Dressing Zetuvit Plus 4x8 in Discharge Instruction: Apply over primary dressing as directed. Secured With Transpore Surgical Tape, 2x10 (in/yd) Discharge Instruction: Secure dressing with  tape as directed. Compression Wrap ThreePress (3 layer compression wrap) Discharge Instruction: Apply three layer compression as directed. Compression Stockings Add-Ons Electronic Signature(s) Signed: 01/14/2022 4:13:50 PM By: Adline Peals Entered By: Adline Peals on 01/14/2022 11:05:07 -------------------------------------------------------------------------------- Vitals Details Patient Name: Date of Service: April Austin [name]. 01/14/2022 11:00 A M Medical Record Number: [id number] Patient Account Number: [id number] Date of Birth/Sex: Treating RN: Aug 15, 1945 (77 y.o. Iver Nestle, [place] Primary Care Breawna Montenegro: Domingo Mend Other Clinician: Referring Cindi Ghazarian: Treating Taksh Hjort/Extender: Gevena Cotton Weeks in Treatment: 16 Vital Signs Time Taken: 10:57 Temperature (F): 97.8 Height (in): 60 Pulse (bpm): 89 Weight (lbs): 101 Respiratory Rate (breaths/min): 16 Body Mass Index (BMI): 19.7 Blood Pressure (mmHg): 147/77 Reference Range: 80 - 120 mg / dl [name], [name] ([id number]) [id number] Page 8 of 8 Electronic Signature(s) Signed: 01/14/2022 5:23:33 PM By: Blanche East RN Entered By: Blanche East on 01/14/2022 10:57:42

## 2022-01-18 ENCOUNTER — Encounter (HOSPITAL_COMMUNITY): Admission: RE | Admit: 2022-01-18 | Payer: Medicare Other | Source: Ambulatory Visit

## 2022-01-18 ENCOUNTER — Ambulatory Visit (HOSPITAL_BASED_OUTPATIENT_CLINIC_OR_DEPARTMENT_OTHER): Admission: RE | Admit: 2022-01-18 | Payer: Medicare Other | Source: Home / Self Care | Admitting: Plastic Surgery

## 2022-01-18 DIAGNOSIS — I10 Essential (primary) hypertension: Secondary | ICD-10-CM

## 2022-01-18 SURGERY — MINOR EXCISION OF LESION
Anesthesia: General | Site: Leg Lower | Laterality: Left

## 2022-01-21 ENCOUNTER — Encounter (HOSPITAL_BASED_OUTPATIENT_CLINIC_OR_DEPARTMENT_OTHER): Payer: Medicare Other | Admitting: General Surgery

## 2022-01-21 DIAGNOSIS — L97812 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d: Secondary | ICD-10-CM | POA: Diagnosis not present

## 2022-01-21 NOTE — Progress Notes (Addendum)
[name], [name] ([id number]) [id number] Page 1 of 8 Visit Report for 01/21/2022 Arrival Information Details Patient Name: Date of Service: [name], [name]. 01/21/2022 11:00 A M Medical Record Number: [id number] Patient Account Number: [id number] Date of Birth/Sex: Treating RN: 1945/12/14 (77 y.o. F) Primary Care Gwenyth Dingee: Domingo Mend Other Clinician: Referring Makell Drohan: Treating Estanislado Surgeon/Extender: Caprice Kluver in Treatment: 57 Visit Information History Since Last Visit All ordered tests and consults were completed: No Patient Arrived: Ambulatory Added or deleted any medications: No Arrival Time: 10:49 Any new allergies or adverse reactions: No Accompanied By: self Had a fall or experienced change in No Transfer Assistance: None activities of daily living that may affect Patient Identification Verified: Yes risk of falls: Secondary Verification Process Completed: Yes Signs or symptoms of abuse/neglect since last visito No Patient Requires Transmission-Based Precautions: No Hospitalized since last visit: No Patient Has Alerts: No Implantable device outside of the clinic excluding No cellular tissue based products placed in the center since last visit: Pain Present Now: No Electronic Signature(s) Signed: 01/21/2022 11:02:05 AM By: Worthy Rancher Entered By: Worthy Rancher on 01/21/2022 10:49:37 -------------------------------------------------------------------------------- Compression Therapy Details Patient Name: Date of Service: [name]. 01/21/2022 11:00 [place] Record Number: [id number] Patient Account Number: [id number] Date of Birth/Sex: Treating RN: 10-11-1945 (77 y.o. [name] Primary Care Guerino Caporale: Domingo Mend Other Clinician: Referring Cozy Veale: Treating Ashlye Oviedo/Extender: Gevena Cotton Weeks in Treatment: 17 Compression Therapy Performed for Wound Assessment:  Wound #2 Right,Lateral Lower Leg Performed By: Clinician Baruch Gouty, RN Compression Type: Three Layer Post Procedure Diagnosis Same as Pre-procedure Electronic Signature(s) Signed: 01/21/2022 4:59:31 PM By: Baruch Gouty RN, BSN Entered By: Baruch Gouty on 01/21/2022 11:29:05 [name], [name] ([id number]) [id number] Page 2 of 8 -------------------------------------------------------------------------------- Encounter Discharge Information Details Patient Name: Date of Service: [name], [name] 01/21/2022 11:00 A M Medical Record Number: [id number] Patient Account Number: [id number] Date of Birth/Sex: Treating RN: [birth year] (77 y.o. [name] Primary Care Fate Caster: Domingo Mend Other Clinician: Referring Icarus Partch: Treating Tamea Bai/Extender: Gevena Cotton Weeks in Treatment: 17 Encounter Discharge Information Items Post Procedure Vitals Discharge Condition: Stable Temperature (F): 97.9 Ambulatory Status: Ambulatory Pulse (bpm): 83 Discharge Destination: Home Respiratory Rate (breaths/min): 18 Transportation: Private Auto Blood Pressure (mmHg): 148/82 Accompanied By: self Schedule Follow-up Appointment: Yes Clinical Summary of Care: Patient Declined Electronic Signature(s) Signed: 01/21/2022 4:59:31 PM By: Baruch Gouty RN, BSN Entered By: Baruch Gouty on 01/21/2022 11:50:27 -------------------------------------------------------------------------------- Lower Extremity Assessment Details Patient Name: Date of Service: [name]. 01/21/2022 11:00 A M Medical Record Number: [id number] Patient Account Number: [id number] Date of Birth/Sex: Treating RN: 08-Jan-1945 (77 y.o. [name] Primary Care Cecelia Graciano: Domingo Mend Other Clinician: Referring Makyna Niehoff: Treating Sherill Mangen/Extender: Gevena Cotton Weeks in Treatment: 17 Edema Assessment Assessed: [Left: No]  [Right: No] Edema: [Left: N] [Right: o] Calf Left: Right: Point of Measurement: From Medial Instep 27.8 cm 27.3 cm Ankle Left: Right: Point of Measurement: From Medial Instep 18 cm 18 cm Vascular Assessment Pulses: Dorsalis Pedis Palpable: [Left:Yes] Electronic Signature(s) Signed: 01/21/2022 4:59:31 PM By: Baruch Gouty RN, BSN Entered By: Baruch Gouty on 01/21/2022 11:23:04 Multi Wound Chart Details -------------------------------------------------------------------------------- [name] ([id number]) [id number] Page 3 of 8 Patient Name: Date of Service: [name], [name]. 01/21/2022 11:00 A M Medical Record Number: [id number] Patient Account Number: [id number] Date of Birth/Sex: Treating RN: 06/02/1945 (77 y.o. F)  Primary Care Atlee Kluth: Domingo Mend Other Clinician: Referring Darion Milewski: Treating Sumner Kirchman/Extender: Gevena Cotton Weeks in Treatment: 17 Vital Signs Height(in): 60 Pulse(bpm): 33 Weight(lbs): 101 Blood Pressure(mmHg): 148/82 Body Mass Index(BMI): 19.7 Temperature(F): 97.9 Respiratory Rate(breaths/min): 20 Wound Assessments Wound Number: 2 N/A N/A Photos: N/A N/A Right, Lateral Lower Leg N/A N/A Wound Location: Surgical Injury N/A N/A Wounding Event: Open Surgical Wound N/A N/A Primary Etiology: Venous Leg Ulcer N/A N/A Secondary Etiology: Cataracts, Chronic sinus N/A N/A Comorbid History: problems/congestion, Hypertension, Crohns, Osteoarthritis 08/17/2021 N/A N/A Date Acquired: 17 N/A N/A Weeks of Treatment: Open N/A N/A Wound Status: No N/A N/A Wound Recurrence: Yes N/A N/A Clustered Wound: 4x1.8x0.1 N/A N/A Measurements L x W x D (cm) 5.655 N/A N/A A (cm) : rea 0.565 N/A N/A Volume (cm) : 68.80% N/A N/A % Reduction in A rea: 68.90% N/A N/A % Reduction in Volume: Full Thickness Without Exposed N/A N/A Classification: Support Structures Medium N/A N/A Exudate A  mount: Serosanguineous N/A N/A Exudate Type: red, brown N/A N/A Exudate Color: Flat and Intact N/A N/A Wound Margin: Large (67-100%) N/A N/A Granulation A mount: Red, Hyper-granulation N/A N/A Granulation Quality: Small (1-33%) N/A N/A Necrotic A mount: Fat Layer (Subcutaneous Tissue): Yes N/A N/A Exposed Structures: Fascia: No Tendon: No Muscle: No Joint: No Bone: No Small (1-33%) N/A N/A Epithelialization: Debridement - Excisional N/A N/A Debridement: Pre-procedure Verification/Time Out 11:25 N/A N/A Taken: Lidocaine 5% topical ointment N/A N/A Pain Control: Necrotic/Eschar, Subcutaneous, N/A N/A Tissue Debrided: Slough Skin/Subcutaneous Tissue N/A N/A Level: 1 N/A N/A Debridement A (sq cm): rea Curette N/A N/A Instrument: Minimum N/A N/A Bleeding: Pressure N/A N/A Hemostasis Achieved: 4 N/A N/A Procedural Pain: 2 N/A N/A Post Procedural Pain: Debridement Treatment Response: Procedure was tolerated well N/A N/A Post Debridement Measurements L x 4x1.8x0.1 N/A N/A W x D (cm) 0.565 N/A N/A Post Debridement Volume: (cm) Excoriation: No N/A N/A Periwound Skin Texture: Induration: No Callus: No Crepitus: No Rash: No Scarring: No Dry/Scaly: No N/A N/A Periwound Skin MoistureMARIBETH, [name] ([id number]) [id number] Page 4 of 8 Atrophie Blanche: No N/A N/A Periwound Skin Color: Cyanosis: No Ecchymosis: No Erythema: No Hemosiderin Staining: No Mottled: No Pallor: No Rubor: No No Abnormality N/A N/A Temperature: Compression Therapy N/A N/A Procedures Performed: Debridement Treatment Notes Electronic Signature(s) Signed: 01/21/2022 11:39:06 AM By: [name] MD FACS Entered By: [name] on 01/21/2022 11:39:06 -------------------------------------------------------------------------------- Multi-Disciplinary Care Plan Details Patient Name: Date of Service: [name]. 01/21/2022 11:00 A M Medical  Record Number: [id number] Patient Account Number: [id number] Date of Birth/Sex: Treating RN: 06/21/1945 (77 y.o. [name] Primary Care Loredana Medellin: Domingo Mend Other Clinician: Referring Thanh Pomerleau: Treating Amiah Frohlich/Extender: Caprice Kluver in Treatment: [place] reviewed with physician Active Inactive Abuse / Safety / Falls / Self Care Management Nursing Diagnoses: History of Falls Potential for falls Goals: Patient will not experience any injury related to falls Date Initiated: 12/07/2021 Target Resolution Date: 02/01/2022 Goal Status: Active Patient/caregiver will verbalize/demonstrate measures taken to prevent injury and/or falls Date Initiated: 09/20/2021 Date Inactivated: 10/19/2021 Target Resolution Date: 10/18/2021 Goal Status: Met Interventions: Assess fall risk on admission and as needed Assess impairment of mobility on admission and as needed per policy Notes: Pressure Nursing Diagnoses: Knowledge deficit related to causes and risk factors for pressure ulcer development Goals: Patient will remain free of pressure ulcers Date Initiated: 12/07/2021 Target Resolution Date: 02/01/2022 Goal Status: Active Interventions: Assess potential for pressure  ulcer upon admission and as needed Notes: Electronic Signature(s) [name], [name] ([id number]) [id number] Page 5 of 8 Signed: 01/21/2022 4:59:31 PM By: Baruch Gouty RN, BSN Entered By: Baruch Gouty on 01/21/2022 11:25:03 -------------------------------------------------------------------------------- Pain Assessment Details Patient Name: Date of Service: [name]. 01/21/2022 11:00 A M Medical Record Number: [id number] Patient Account Number: [id number] Date of Birth/Sex: Treating RN: 09/02/1945 (77 y.o. F) Primary Care Kamaree Berkel: Domingo Mend Other Clinician: Referring Drianna Chandran: Treating Verdell Dykman/Extender: Gevena Cotton Weeks in Treatment: 17 Active Problems Location of Pain Severity and Description of Pain Patient Has Paino No Site Locations Pain Management and Medication Current Pain Management: Electronic Signature(s) Signed: 01/21/2022 11:02:05 AM By: Worthy Rancher Entered By: Worthy Rancher on 01/21/2022 10:50:08 -------------------------------------------------------------------------------- Patient/Caregiver Education Details Patient Name: Date of Service: [name]. 1/15/2024andnbsp11:00 [place] Record Number: [id number] Patient Account Number: [id number] Date of Birth/Gender: Treating RN: 09/08/1945 (77 y.o. [name] Primary Care Physician: Domingo Mend Other Clinician: Referring Physician: Treating Physician/Extender: Caprice Kluver in Treatment: 17 Education Assessment Education Provided To: Patient [name], [name] ([id number]) [id number] Page 6 of 8 Education Topics Provided Venous: Methods: Explain/Verbal Responses: Reinforcements needed, State content correctly Wound Debridement: Methods: Explain/Verbal Responses: Reinforcements needed, State content correctly Electronic Signature(s) Signed: 01/21/2022 4:59:31 PM By: Baruch Gouty RN, BSN Entered By: Baruch Gouty on 01/21/2022 11:25:24 -------------------------------------------------------------------------------- Wound Assessment Details Patient Name: Date of Service: [name]. 01/21/2022 11:00 A M Medical Record Number: [id number] Patient Account Number: [id number] Date of Birth/Sex: Treating RN: [birth year] (77 y.o. F) Primary Care Kaelynne Christley: Domingo Mend Other Clinician: Referring Lindzy Rupert: Treating Dennison Mcdaid/Extender: Gevena Cotton Weeks in Treatment: 17 Wound Status Wound Number: 2 Primary Open Surgical Wound Etiology: Wound Location: Right, Lateral Lower Leg Secondary  Venous Leg Ulcer Wounding Event: Surgical Injury Etiology: Date Acquired: 08/17/2021 Wound Status: Open Weeks Of Treatment: 17 Comorbid Cataracts, Chronic sinus problems/congestion, Hypertension, Clustered Wound: Yes History: Crohns, Osteoarthritis Photos Wound Measurements Length: (cm) 4 Width: (cm) 1.8 Depth: (cm) 0.1 Area: (cm) 5.655 Volume: (cm) 0.565 % Reduction in Area: 68.8% % Reduction in Volume: 68.9% Epithelialization: Small (1-33%) Tunneling: No Undermining: No Wound Description Classification: Full Thickness Without Exposed Suppor Wound Margin: Flat and Intact Exudate Amount: Medium Exudate Type: Serosanguineous Exudate Color: red, brown t Structures Foul Odor After Cleansing: No Slough/Fibrino Yes Wound Bed Granulation Amount: Large (67-100%) Exposed Structure Granulation Quality: Red, Hyper-granulation Fascia Exposed: No Necrotic Amount: Small (1-33%) Fat Layer (Subcutaneous Tissue) Exposed: Yes Necrotic Quality: Adherent Slough Tendon Exposed: No [name], [name] ([id number]) [id number] Page 7 of 8 Muscle Exposed: No Joint Exposed: No Bone Exposed: No Periwound Skin Texture Texture Color No Abnormalities Noted: Yes No Abnormalities Noted: Yes Moisture Temperature / Pain No Abnormalities Noted: Yes Temperature: No Abnormality Treatment Notes Wound #2 (Lower Leg) Wound Laterality: Right, Lateral Cleanser Soap and Water Discharge Instruction: May shower and wash wound with dial antibacterial soap and water prior to dressing change. Wound Cleanser Discharge Instruction: Cleanse the wound with wound cleanser prior to applying a clean dressing using gauze sponges, not tissue or cotton balls. Peri-Wound Care Sween Lotion (Moisturizing lotion) Discharge Instruction: Apply moisturizing lotion as directed Topical Mupirocin Ointment Discharge Instruction: Apply Mupirocin (Bactroban) as instructed Primary Dressing Hydrofera  Blue Ready Transfer Foam, 2.5x2.5 (in/in) Discharge Instruction: Apply directly to wound bed as directed Secondary Dressing Zetuvit Plus 4x8 in Discharge Instruction: Apply over primary dressing as directed. Secured With  Compression Wrap ThreePress (3 layer compression wrap) Discharge Instruction: Apply three layer compression as directed. Compression Stockings Add-Ons Electronic Signature(s) Signed: 01/21/2022 4:59:31 PM By: Baruch Gouty RN, BSN Previous Signature: 01/21/2022 11:02:05 AM Version By: Worthy Rancher Entered By: Baruch Gouty on 01/21/2022 11:22:00 -------------------------------------------------------------------------------- Vitals Details Patient Name: Date of Service: [name]. 01/21/2022 11:00 A M Medical Record Number: [id number] Patient Account Number: [id number] Date of Birth/Sex: Treating RN: 1945-07-12 (77 y.o. F) Primary Care Deidrick Rainey: Domingo Mend Other Clinician: Referring Brodi Nery: Treating Nashira Mcglynn/Extender: Gevena Cotton Weeks in Treatment: 17 Vital Signs Time Taken: 10:49 Temperature (F): 97.9 Height (in): 60 Pulse (bpm): 83 Weight (lbs): 101 Respiratory Rate (breaths/min): 20 Body Mass Index (BMI): 19.7 Blood Pressure (mmHg): 148/82 Reference Range: 80 - 120 mg / dl [name], [name] ([id number]) [id number] Page 8 of 8 Electronic Signature(s) Signed: 01/21/2022 11:02:05 AM By: Worthy Rancher Entered By: Worthy Rancher on 01/21/2022 10:50:01

## 2022-01-21 NOTE — Progress Notes (Signed)
PAQUITA, PRINTY [name] ([id number]) [id number] Page 1 of 11 Visit Report for 01/21/2022 Chief Complaint Document Details Patient Name: Date of Service: [name], [name] 01/21/2022 11:00 A M Medical Record Number: [id number] Patient Account Number: [id number] Date of Birth/Sex: Treating RN: 1945/04/17 (77 y.o. F) Primary Care Provider: Domingo Mend Other Clinician: Referring Provider: Treating Provider/Extender: Caprice Kluver in Treatment: 17 Information Obtained from: Patient Chief Complaint 11/15/2020; patient is here for review of a wound area on the right posterior calf 09/20/2021: Patient is here for a nonhealing wound after undergoing Mohs surgery on her right lower leg. 12/07/2021: Reopening of skin at site of previous wound, new wound on right toe, concerning lesion on left upper leg just above knee Electronic Signature(s) Signed: 01/21/2022 11:39:17 AM By: [name] MD FACS Entered By: [name] on 01/21/2022 11:39:17 -------------------------------------------------------------------------------- Debridement Details Patient Name: Date of Service: April Austin [name]. 01/21/2022 11:00 A M Medical Record Number: [id number] Patient Account Number: [id number] Date of Birth/Sex: Treating RN: 03/10/1945 (77 y.o. [name], [name] Primary Care Provider: Domingo Mend Other Clinician: Referring Provider: Treating Provider/Extender: Gevena Cotton Weeks in Treatment: 17 Debridement Performed for Assessment: Wound #2 Right,Lateral Lower Leg Performed By: Physician [name], MD Debridement Type: Debridement Severity of Tissue Pre Debridement: Fat layer exposed Level of Consciousness (Pre-procedure): Awake and Alert Pre-procedure Verification/Time Out Yes - 11:25 Taken: Start Time: 11:27 Pain Control: Lidocaine 5% topical ointment T Area Debrided (L x W): otal 1 (cm) x 1 (cm) = 1  (cm) Tissue and other material debrided: Non-Viable, Eschar, Slough, Subcutaneous, Biofilm, Slough Level: Skin/Subcutaneous Tissue Debridement Description: Excisional Instrument: Curette Bleeding: Minimum Hemostasis Achieved: Pressure Procedural Pain: 4 Post Procedural Pain: 2 Response to Treatment: Procedure was tolerated well Level of Consciousness (Post- Awake and Alert procedure): Post Debridement Measurements of Total Wound Length: (cm) 4 Width: (cm) 1.8 Depth: (cm) 0.1 Volume: (cm) 0.565 [name], [name] ([id number]) [id number] Page 2 of 11 Character of Wound/Ulcer Post Debridement: Improved Severity of Tissue Post Debridement: Fat layer exposed Post Procedure Diagnosis Same as Pre-procedure Notes scribed by Baruch Gouty, RN for Dr. [name] Electronic Signature(s) Signed: 01/21/2022 3:23:09 PM By: [name] MD FACS Signed: 01/21/2022 4:59:31 PM By: Baruch Gouty RN, BSN Previous Signature: 01/21/2022 12:17:58 PM Version By: [name] MD FACS Entered By: Baruch Gouty on 01/21/2022 15:17:24 -------------------------------------------------------------------------------- HPI Details Patient Name: Date of Service: April Austin [name]. 01/21/2022 11:00 A M Medical Record Number: [id number] Patient Account Number: [id number] Date of Birth/Sex: Treating RN: 1945-07-03 (77 y.o. F) Primary Care Provider: Domingo Mend Other Clinician: Referring Provider: Treating Provider/Extender: Gevena Cotton Weeks in Treatment: 17 History of Present Illness HPI Description: ADMISSION 11/15/2020 This is a 77 year old woman with a history of squamous cell cancer skin cancer followed by Dr. Anabel Bene dermatology. She describes having a wound on the right posterior calf dating back since September. She saw her primary doctor in early October who suggested petroleum infused gauze. The patient has been doing this. No real  improvement in fact she says this is getting larger. She does not complain much of pain. She is completely uncertain about how this happened there was no trauma she simply became aware of it at some point describing it is about the size of a quarter but it has been getting progressively larger since then. She is here for review of this. Past medical history Crohn's disease not currently on prednisone, gastroesophageal  reflux disease, history of squamous cell skin cancer, history of rheumatoid arthritis, hypertension 11/16; this lady had a large superficial atypical wound on the right posterior calf. She has significant solar skin damage probably some degree of venous insufficiency but she described a rapidly expanding wound area. Because of this and a history of skin cancer [name] went ahead and did a shave biopsy but we do not have the results of this at the time we saw her in clinic today. She has been using silver alginate and kerlix 11/30; shave biopsy [name] did last time did not show any malignancy or fungal elements. Suggestion of a stasis ulcer. With our treatment which is simply been silver alginate with kerlix that she is changing daily the wound is down dramatically almost surprising how rapidly it is reduced in surface area 12/14; patient comes in today with her wound healed this was on the right posterior calf. She has significant solar skin damage probably some degree of venous insufficiency however she described a rapidly expanding wound area on admission. Because of this and a history of skin cancer [name] did do a shave biopsy but nothing was really found of worry. 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 Unfortunately, the wound has failed  to heal. There is thick slough on the wound surface. The periwound skin is in reasonably good condition. There is granulation tissue forming underneath the slough. 09/28/2021: The wound has deteriorated rather significantly over the past week. She had fairly copious drainage that she noticed as early as Monday, but she did not contact our office. Today there is a lot of periwound skin breakdown and a very pungent odor coming from the wound. There is thick slough on the wound surface but there is still good granulation tissue underlying this. Despite this, however, the wound actually measures smaller. 10/05/2021: The culture that [name] took grew out MRSA. [name] prescribed doxycycline and she is still taking this. There has been a tremendous improvement in the wound. It is smaller and has contracted considerably. There is hypertrophic granulation tissue present. No malodorous drainage. 10/16/2021: The wound has contracted further and is epithelializing. The surface is very clean and the hypertrophic granulation tissue has not recurred. She completed her course of doxycycline. 10/19; surgical wound on the right medial lower extremity secondary to Mohs surgery done in August. She is using Hydrofera Blue under 3 layer compression and making excellent progress. 11/02/2021: The wound is nearly completely epithelialized. Just a couple of open areas with some eschar overlying the surface. 11/12/2021: Just 1 small open area remains. It is clean without any slough or eschar accumulation. No hypertrophic granulation tissue present. 11/19/2021: Her wound is healed. [name], [name] ([id number]) [id number] Page 3 of 11 12/07/2021 The patient has been moving for the past couple of weeks and has spent a substantial amount of time on her feet. As result, she developed leg swelling and the tissue where her wound had been, which she was protecting with a Band-Aid, broke down considerably.  She now has a large wound that is irregular with extensive slough, involving the fat layer. She also developed a blister on her toe that has slough and eschar present. She also pointed out lump just above her left knee that she  is concerned about, as she does have a history of multiple skin cancers. 12/11; patient comes in the clinic with a large wound on the right anterior lower leg. This was initially a skin cancer underwent Mohs surgery and was treated in this clinic and healed out on 11/13. The patient states the wound reopened with edema and also trauma from a dressing she was putting on it. 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 She also has a wound on the right second toe. The patient states quite adamantly she will not go back to her previous dermatologist for the skin surgery center in [place] blaming them for the original wound on her right lateral leg. [name] am not exactly sure of her rationale but at this point she certainly will not consider a referral there 12/18; the patient has an appointment at [facility] on 12/28 although [name] am not sure exactly who she is seeing. This is in reference to the skin cancer just above her left patella. She would not go back to the skin surgery or indeed her dermatologist in [place]. She had an area on the right second toe this is healed today. Her original surgical site on the right anterior lateral lower leg actually looks some better. [name] changed her to Iodoflex last week 01/01/2022: The patient has an appointment with plastic surgery on Thursday to evaluate the biopsy-proven squamous cell carcinoma on her left upper leg. The wound on her right lower leg has a thick layer of slough on the surface and has a very atypical appearance. 01/08/2022: The biopsies that [name] took last week from the wound on her right lower extremity were negative for malignancy but did demonstrate some bacteria along with inflammation. She met  with the plastic surgeon on Thursday and she is going to undergo wide local excision of the biopsy-proven squamous cell carcinoma on her left upper leg. The right lower leg wound is narrower again this week. There is slough and eschar as well as some fibrinous debris in the areas where [name] took the biopsies and obtained hemostasis with silver nitrate. 01/14/2022: The wound is narrower today, but is beginning to accumulate hypertrophic granulation tissue. Thin layer of slough on the surface. There is still slough and fibrinous debris in the 2 biopsy sites. 01/21/2022: The wound continues to contract. There is still slough and nonviable subcutaneous tissue in both biopsy sites. 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 Electronic Signature(s) Signed: 01/21/2022 11:40:21 AM By: [name] MD FACS Entered By: [name] on 01/21/2022 11:40:21 -------------------------------------------------------------------------------- Physical Exam Details Patient Name: Date of Service: April Austin [name]. 01/21/2022 11:00 A M Medical Record Number: [id number] Patient Account Number: [id number] Date of Birth/Sex: Treating RN: [birth year] (77 y.o. F) Primary Care Provider: Domingo Mend Other Clinician: Referring Provider: Treating Provider/Extender: Gevena Cotton Weeks in Treatment: 17 Constitutional Slightly hypertensive. . . . no acute distress. Respiratory Normal work of breathing on room air. Notes 01/21/2022: The wound continues to contract. There is still slough and nonviable subcutaneous tissue in both biopsy sites. Electronic Signature(s) Signed: 01/21/2022 11:40:51 AM By: [name] MD FACS Entered By: [name] on 01/21/2022 11:40:51 Physician Orders  Details -------------------------------------------------------------------------------- [name] ([id number]) [id number] Page 4 of 11 Patient Name: Date of Service: [name], April  [name]. 01/21/2022 11:00 A M Medical Record Number: [id number] Patient Account Number: [id number] Date of Birth/Sex: Treating RN: 1945/03/07 (77 y.o. [name], [name] Primary Care Provider: Domingo Mend Other Clinician: Referring Provider: Treating Provider/Extender: Caprice Kluver in Treatment: 17 Verbal / Phone Orders: No Diagnosis Coding ICD-10 Coding Code Description [phone] 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d T81.31XS Disruption of external operation (surgical) wound, not elsewhere classified, sequela L98.9 Disorder of the skin and subcutaneous tissue, unspecified C44.722 Squamous cell carcinoma of skin of right lower limb, including hip I10 Essential (primary) hypertension K50.90 Crohn's disease, unspecified, without complications I78 Unspecified severe protein-calorie malnutrition Follow-up Appointments ppointment in 1 week. - Dr. [name] Rm 1 Return A Monday 1/22 @ 1030 am Anesthetic (In clinic) Topical Lidocaine 4% applied to wound bed Bathing/ Shower/ Hygiene May shower with protection but do not get wound dressing(s) wet. Protect dressing(s) with water repellant cover (for example, large plastic bag) or a cast cover and may then take shower. Edema Control - Lymphedema / SCD / Other Avoid standing for long periods of time. Moisturize legs daily. Wound Treatment Wound #2 - Lower Leg Wound Laterality: Right, Lateral Cleanser: Soap and Water 1 x Per Week/30 Days Discharge Instructions: May shower and wash wound with dial antibacterial soap and water prior to dressing change. Cleanser: Wound Cleanser 1 x Per Week/30 Days Discharge Instructions: Cleanse the wound with wound cleanser  prior to applying a clean dressing using gauze sponges, not tissue or cotton balls. Peri-Wound Care: Sween Lotion (Moisturizing lotion) 1 x Per Week/30 Days Discharge Instructions: Apply moisturizing lotion as directed Topical: Mupirocin Ointment 1 x Per Week/30 Days Discharge Instructions: Apply Mupirocin (Bactroban) as instructed Prim Dressing: Hydrofera Blue Ready Transfer Foam, 2.5x2.5 (in/in) 1 x Per Week/30 Days ary Discharge Instructions: Apply directly to wound bed as directed Secondary Dressing: Zetuvit Plus 4x8 in 1 x Per Week/30 Days Discharge Instructions: Apply over primary dressing as directed. Compression Wrap: ThreePress (3 layer compression wrap) 1 x Per Week/30 Days Discharge Instructions: Apply three layer compression as directed. Electronic Signature(s) Signed: 01/21/2022 12:17:58 PM By: [name] MD FACS Entered By: [name] on 01/21/2022 11:41:14 Problem List Details -------------------------------------------------------------------------------- [name] ([id number]) [id number] Page 5 of 11 Patient Name: Date of Service: [name], [name]. 01/21/2022 11:00 A M Medical Record Number: [id number] Patient Account Number: [id number] Date of Birth/Sex: Treating RN: [birth year] (77 y.o. [name] Primary Care Provider: Domingo Mend Other Clinician: Referring Provider: Treating Provider/Extender: Gevena Cotton Weeks in Treatment: 17 Active Problems ICD-10 Encounter Code Description Active Date MDM Diagnosis L97.812 Non-pressure chronic ulcer of other part of right lower leg with fat layer 09/20/2021 No Yes exposed T81.31XS Disruption of external operation (surgical) wound, not elsewhere classified, 09/20/2021 No Yes sequela L98.9 Disorder of the skin and subcutaneous tissue, unspecified 12/07/2021 No Yes C44.722 Squamous cell carcinoma of skin of right lower limb, including hip  09/20/2021 No Yes I10 Essential (primary) hypertension 09/20/2021 No Yes K50.90 Crohn's disease, unspecified, without complications 7/59/1638 No Yes E43 Unspecified severe protein-calorie malnutrition 09/20/2021 No Yes Inactive Problems ICD-10 Code Description Active Date Inactive Date L97.512 Non-pressure chronic ulcer of other part of right foot with fat layer exposed 12/07/2021 12/07/2021 Resolved Problems Electronic Signature(s) Signed: 01/21/2022 11:38:58 AM By: [name] MD FACS Entered By: [name] on 01/21/2022 11:38:57 -------------------------------------------------------------------------------- Progress Note Details Patient Name: Date of Service: April Lennox Laity [name].  01/21/2022 11:00 A M Medical Record Number: [id number] Patient Account Number: [id number] Date of Birth/Sex: Treating RN: [birth year] (77 y.o. F) Primary Care Provider: Domingo Mend Other Clinician: Referring Provider: Treating Provider/Extender: Caprice Kluver in Treatment: [name] [place] [name] ([id number]) [id number] Page 6 of 11 Subjective Chief Complaint Information obtained from Patient 11/15/2020; patient is here for review of a wound area on the right posterior calf 09/20/2021: Patient is here for a nonhealing wound after undergoing Mohs surgery on her right lower leg. 12/07/2021: Reopening of skin at site of previous wound, new wound on right toe, concerning lesion on left upper leg just above knee History of Present Illness (HPI) ADMISSION 11/15/2020 This is a 77 year old woman with a history of squamous cell cancer skin cancer followed by Dr. Anabel Bene dermatology. She describes having a wound on the right posterior calf dating back since September. She saw her primary doctor in early October who suggested petroleum infused gauze. The patient has been doing this. No real improvement in fact she says this is getting larger. She does not  complain much of pain. She is completely uncertain about how this happened there was no trauma she simply became aware of it at some point describing it is about the size of a quarter but it has been getting progressively larger since then. She is here for review of this. Past medical history Crohn's disease not currently on prednisone, gastroesophageal reflux disease, history of squamous cell skin cancer, history of rheumatoid arthritis, hypertension 11/16; this lady had a large superficial atypical wound on the right posterior calf. She has significant solar skin damage probably some degree of venous insufficiency but she described a rapidly expanding wound area. Because of this and a history of skin cancer [name] went ahead and did a shave biopsy but we do not have the results of this at the time we saw her in clinic today. She has been using silver alginate and kerlix 11/30; shave biopsy [name] did last time did not show any malignancy or fungal elements. Suggestion of a stasis ulcer. With our treatment which is simply been silver alginate with kerlix that she is changing daily the wound is down dramatically almost surprising how rapidly it is reduced in surface area 12/14; patient comes in today with her wound healed this was on the right posterior calf. She has significant solar skin damage probably some degree of venous insufficiency however she described a rapidly expanding wound area on admission. Because of this and a history of skin cancer [name] did do a shave biopsy but nothing was really found of worry. 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 Unfortunately, the wound has failed to heal. There is thick slough on the wound surface. The periwound skin  is in reasonably good condition. There is granulation tissue forming underneath the slough. 09/28/2021: The wound has deteriorated rather significantly over the past week. She had fairly copious drainage that she noticed as early as Monday, but she did not contact our office. Today there is a lot of periwound skin breakdown and a very pungent odor coming from the wound. There is thick slough on the wound surface but there is still good granulation tissue underlying this. Despite this, however, the wound actually  measures smaller. 10/05/2021: The culture that [name] took grew out MRSA. [name] prescribed doxycycline and she is still taking this. There has been a tremendous improvement in the wound. It is smaller and has contracted considerably. There is hypertrophic granulation tissue present. No malodorous drainage. 10/16/2021: The wound has contracted further and is epithelializing. The surface is very clean and the hypertrophic granulation tissue has not recurred. She completed her course of doxycycline. 10/19; surgical wound on the right medial lower extremity secondary to Mohs surgery done in August. She is using Hydrofera Blue under 3 layer compression and making excellent progress. 11/02/2021: The wound is nearly completely epithelialized. Just a couple of open areas with some eschar overlying the surface. 11/12/2021: Just 1 small open area remains. It is clean without any slough or eschar accumulation. No hypertrophic granulation tissue present. 11/19/2021: Her wound is healed. READMISSION 12/07/2021 The patient has been moving for the past couple of weeks and has spent a substantial amount of time on her feet. As result, she developed leg swelling and the tissue where her wound had been, which she was protecting with a Band-Aid, broke down considerably. She now has a large wound that is irregular with extensive slough, involving the fat layer. She also developed a blister on her toe that has slough and  eschar present. She also pointed out lump just above her left knee that she is concerned about, as she does have a history of multiple skin cancers. 12/11; patient comes in the clinic with a large wound on the right anterior lower leg. This was initially a skin cancer underwent Mohs surgery and was treated in this clinic and healed out on 11/13. The patient states the wound reopened with edema and also trauma from a dressing she was putting on it. 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 She also has a wound on the right second toe. The patient states quite adamantly she will not go back to her previous dermatologist for the skin surgery center in [place] blaming them for the original wound on her right lateral leg. [name] am not exactly sure of her rationale but at this point she certainly will not consider a referral there 12/18; the patient has an appointment at [place] on 12/28 although [name] am not sure exactly who she is seeing. This is in reference to the skin cancer just above her left patella. She would not go back to the skin surgery or indeed her dermatologist in [place]. She had an area on the right second toe this is healed today. Her original surgical site on the right anterior lateral lower leg actually looks some better. [name] changed her to Iodoflex last week 01/01/2022: The patient has an appointment with plastic surgery on Thursday to evaluate the biopsy-proven squamous cell carcinoma on her left upper leg. The wound on her right lower leg has a thick layer of slough on the surface and has a very atypical appearance. 01/08/2022: The biopsies that [name] took last week from the wound on her right lower extremity were negative for malignancy but did demonstrate some bacteria along with inflammation. She met with the plastic surgeon on Thursday and she is going to undergo wide local excision of the biopsy-proven squamous cell carcinoma on her left upper  leg. The right lower leg wound is narrower again this week. There is slough and eschar as well as some fibrinous debris in the areas where [name] took the biopsies and obtained hemostasis with silver  nitrate. 01/14/2022: The wound is narrower today, but is beginning to accumulate hypertrophic granulation tissue. Thin layer of slough on the surface. There is still slough and fibrinous debris in the 2 biopsy sites. 01/21/2022: The wound continues to contract. There is still slough and nonviable subcutaneous tissue in both biopsy sites. 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 Patient History Information obtained from Patient. Family History Diabetes - Father, Heart Disease - Father, Hypertension - Father, No family history of Cancer, Hereditary Spherocytosis, Kidney Disease, Lung Disease, Seizures, Stroke, Thyroid Problems, Tuberculosis. Social History Former smoker - quit 30 years ago, Marital Status - Married, Alcohol Use - Never, Drug Use - No History, Caffeine Use - Rarely. Medical History Eyes Patient has history of Cataracts - Had cataract surgery Denies history of Glaucoma, Optic Neuritis Ear/Nose/Mouth/Throat Patient has history of Chronic sinus problems/congestion - Post Nasal drip Denies history of Middle ear problems Hematologic/Lymphatic Denies history of Anemia, Hemophilia, Human Immunodeficiency Virus, Lymphedema, Sickle Cell Disease Respiratory Denies history of Aspiration, Asthma, Chronic Obstructive Pulmonary Disease (COPD), Pneumothorax, Sleep Apnea, Tuberculosis Cardiovascular Patient has history of Hypertension Gastrointestinal Patient has history of Crohnoos - Has Chrons Disease Denies history of Cirrhosis , Colitis, Hepatitis A, Hepatitis B,  Hepatitis C Endocrine Denies history of Type [name] Diabetes, Type II Diabetes Genitourinary Denies history of End Stage Renal Disease Immunological Denies history of Lupus Erythematosus, Raynaudoos, Scleroderma Musculoskeletal Patient has history of Osteoarthritis - Knees Denies history of Gout, Rheumatoid Arthritis, Osteomyelitis Neurologic Denies history of Dementia, Neuropathy, Quadriplegia, Paraplegia, Seizure Disorder Oncologic Denies history of Received Chemotherapy, Received Radiation Psychiatric Denies history of Anorexia/bulimia, Confinement Anxiety Hospitalization/Surgery History - bil TKA. - Moh's procedure right leg 08/17/21. - bil cataract extractions. - nasal fx surgery. Medical A Surgical History Notes nd Gastrointestinal Has a large hiatal hernia. Has Chrons Disease., GERD, pancreatic insufficiency Endocrine Pancreatic Insufficiency Genitourinary stage 3 Chronic Kidney disease Integumentary (Skin) Had Squamous Cell (skin cancer). Had a skin graft on Right knee (area) Musculoskeletal Had Bil knee replacements, hx cervical spine fx, sternal fx, thoracis spine fx Neurologic left foot drop Oncologic Had Squamous Cell (skin cell) Objective Constitutional Slightly hypertensive. no acute distress. Vitals Time Taken: 10:49 AM, Height: 60 in, Weight: 101 lbs, BMI: 19.7, Temperature: 97.9 F, Pulse: 83 bpm, Respiratory Rate: 20 breaths/min, Blood Pressure: 148/82 mmHg. Respiratory Normal work of breathing on room air. General Notes: 01/21/2022: The wound continues to contract. There is still slough and nonviable subcutaneous tissue in both biopsy sites. Integumentary (Hair, Skin) Wound #2 status is Open. Original cause of wound was Surgical Injury. The date acquired was: 08/17/2021. The wound has been in treatment 17 weeks. The wound is located on the Right,Lateral Lower Leg. The wound measures 4cm length x 1.8cm width x 0.1cm depth; 5.655cm^2 area and 0.565cm^3 volume.  There [name], [name] ([id number]) [id number] Page 8 of 11 is Fat Layer (Subcutaneous Tissue) exposed. There is no tunneling or undermining noted. There is a medium amount of serosanguineous drainage noted. The wound margin is flat and intact. There is large (67-100%) red, hyper - granulation within the wound bed. There is a small (1-33%) amount of necrotic tissue within the wound bed including Adherent Slough. The periwound skin appearance had no abnormalities noted for texture. The periwound skin appearance had no abnormalities  noted for moisture. The periwound skin appearance had no abnormalities noted for color. Periwound temperature was noted as No Abnormality. 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 There was a Excisional Skin/Subcutaneous Tissue Debridement with a total area of 1 sq cm performed by [name], MD. With the following instrument(s): Curette to remove Non-Viable tissue/material. Material removed includes Eschar, Subcutaneous Tissue, Slough, and Biofilm after achieving pain control using Lidocaine 5% topical ointment. No specimens were taken. A time out was conducted at 11:25, prior to the start of the procedure. A Minimum amount of bleeding was controlled with Pressure. The procedure was tolerated well with a pain level of 4 throughout and a pain level  of 2 following the procedure. Post Debridement Measurements: 4cm length x 1.8cm width x 0.1cm depth; 0.565cm^3 volume. Character of Wound/Ulcer Post Debridement is improved. Severity of Tissue Post Debridement is: Fat layer exposed. Post procedure Diagnosis Wound #2: Same as Pre-Procedure Pre-procedure diagnosis of Wound #2 is an Open Surgical Wound located on the Right,Lateral Lower Leg . There was a Three Layer Compression Therapy Procedure by Baruch Gouty, RN. Post procedure Diagnosis Wound #2: Same as Pre-Procedure Plan Follow-up Appointments: Return Appointment in 1 week. - Dr. [name] Rm 1 Monday 1/22 @ 1030 am Anesthetic: (In clinic) Topical Lidocaine 4% applied to wound bed Bathing/ Shower/ Hygiene: May shower with protection but do not get wound dressing(s) wet. Protect dressing(s) with water repellant cover (for example, large plastic bag) or a cast cover and may then take shower. Edema Control - Lymphedema / SCD / Other: Avoid standing for long periods of time. Moisturize legs daily. WOUND #2: - Lower Leg Wound Laterality: Right, Lateral Cleanser: Soap and Water 1 x Per Week/30 Days Discharge Instructions: May shower and wash wound with dial antibacterial soap and water prior to dressing change. Cleanser: Wound Cleanser 1 x Per Week/30 Days Discharge Instructions: Cleanse the wound with wound cleanser prior to applying a clean dressing using gauze sponges, not tissue or cotton balls. Peri-Wound Care: Sween Lotion (Moisturizing lotion) 1 x Per Week/30 Days Discharge Instructions: Apply moisturizing lotion as directed Topical: Mupirocin Ointment 1 x Per Week/30 Days Discharge Instructions: Apply Mupirocin (Bactroban) as instructed Prim Dressing: Hydrofera Blue Ready Transfer Foam, 2.5x2.5 (in/in) 1 x Per Week/30 Days ary Discharge Instructions: Apply directly to wound bed as directed Secondary Dressing: Zetuvit Plus 4x8 in 1 x Per Week/30 Days Discharge Instructions: Apply  over primary dressing as directed. Com pression Wrap: ThreePress (3 layer compression wrap) 1 x Per Week/30 Days Discharge Instructions: Apply three layer compression as directed. 01/21/2022: The wound continues to contract. There is still slough and nonviable subcutaneous tissue in both biopsy sites. [name] used a curette to debride slough and nonviable subcutaneous tissue from the wound, as well as some eschar from the wound perimeter. We will continue topical mupirocin, Hydrofera Blue, and 3 layer compression. Follow-up in 1 week [name], [name] ([id number]) [id number] Page 9 of 11 Electronic Signature(s) Signed: 01/21/2022 11:41:48 AM By: [name] MD FACS Entered By: [name]  on 01/21/2022 11:41:48 -------------------------------------------------------------------------------- HxROS Details Patient Name: Date of Service: [name], [name]. 01/21/2022 11:00 A M Medical Record Number: [id number] Patient Account Number: [id number] Date of Birth/Sex: Treating RN: [birth year] (77 y.o. F) Primary Care Provider: Domingo Mend Other Clinician: Referring Provider: Treating Provider/Extender: Caprice Kluver in Treatment: 17 Information Obtained From Patient Eyes Medical History: Positive for: Cataracts - Had cataract surgery Negative for: Glaucoma; Optic Neuritis Ear/Nose/Mouth/Throat Medical History: Positive for: Chronic sinus problems/congestion - Post Nasal drip Negative for: Middle ear problems Hematologic/Lymphatic Medical History: Negative for: Anemia; Hemophilia; Human Immunodeficiency Virus; Lymphedema; Sickle Cell Disease Respiratory Medical History: Negative for: Aspiration; Asthma; Chronic Obstructive Pulmonary Disease (COPD); Pneumothorax; Sleep Apnea; Tuberculosis Cardiovascular Medical History: Positive for: Hypertension Gastrointestinal Medical History: Positive for: Crohns - Has Chrons  Disease Negative for: Cirrhosis ; Colitis; Hepatitis A; Hepatitis B; Hepatitis C Past Medical History Notes: Has a large hiatal hernia. Has Chrons Disease., GERD, pancreatic insufficiency Endocrine Medical History: Negative for: Type [name] Diabetes; Type II Diabetes Past Medical History Notes: Pancreatic Insufficiency Genitourinary Medical History: Negative for: End Stage Renal Disease Past Medical History Notes: stage 3 Chronic Kidney disease Immunological Medical History: Negative for: Lupus Erythematosus; Raynauds; Scleroderma [name], [name] ([id number]) [id number] Page 10 of 11 Integumentary (Skin) Medical History: Past Medical History Notes: Had Squamous Cell (skin cancer). Had a skin graft on Right knee (area) Musculoskeletal Medical History: Positive for: Osteoarthritis - Knees Negative for: Gout; Rheumatoid Arthritis; Osteomyelitis Past Medical History Notes: Had Bil knee replacements, hx cervical spine fx, sternal fx, thoracis spine fx Neurologic Medical History: Negative for: Dementia; Neuropathy; Quadriplegia; Paraplegia; Seizure Disorder Past Medical History Notes: left foot drop Oncologic Medical History: Negative for: Received Chemotherapy; Received Radiation Past Medical History Notes: Had Squamous Cell (skin cell) Psychiatric Medical History: Negative for: Anorexia/bulimia; Confinement Anxiety HBO Extended History Items Ear/Nose/Mouth/Throat: Eyes: Chronic sinus Cataracts problems/congestion Immunizations Pneumococcal Vaccine: Received Pneumococcal Vaccination: Yes Received Pneumococcal Vaccination On or After 60th Birthday: Yes Tetanus Vaccine: Last tetanus shot: 11/16/2011 Implantable Devices None Hospitalization / Surgery History Type of Hospitalization/Surgery bil TKA Moh's procedure right leg 08/17/21 bil cataract extractions nasal fx surgery Family and Social History Cancer: No; Diabetes: Yes - Father; Heart  Disease: Yes - Father; Hereditary Spherocytosis: No; Hypertension: Yes - Father; Kidney Disease: No; Lung Disease: No; Seizures: No; Stroke: No; Thyroid Problems: No; Tuberculosis: No; Former smoker - quit 30 years ago; Marital Status - Married; Alcohol Use: Never; Drug Use: No History; Caffeine Use: Rarely; Financial Concerns: No; Food, Clothing or Shelter Needs: No; Support System Lacking: No; Transportation Concerns: No Electronic Signature(s) Signed: 01/21/2022 12:17:58 PM By: [name] MD FACS Entered By: [name] on 01/21/2022 11:40:28 -------------------------------------------------------------------------------- SuperBill Details Patient Name: Date of Service: Karma Ganja 01/21/2022 [name] ([id number]) [id number] Page 11 of 11 Medical Record Number: [id number] Patient Account Number: [id number] Date of Birth/Sex: Treating RN: [birth year] (77 y.o. 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, including hip I10 Essential (primary) hypertension K50.90 Crohn's disease, unspecified, without complications S49 Unspecified severe protein-calorie malnutrition Facility Procedures : CPT4 Code: [id number] Description:  11042 - DEB SUBQ TISSUE 20 SQ CM/< ICD-10 Diagnosis Description T94.997 Non-pressure chronic ulcer of other part of right lower leg with fat layer exp Modifier: osed Quantity: 1 Physician Procedures : CPT4 Code Description Modifier [id number] -  WC PHYS LEVEL 4 - EST PT 25 ICD-10 Diagnosis Description W89.340 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d T81.31XS Disruption of external operation (surgical)  wound, not elsewhere classified, sequela C44.722 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

## 2022-01-25 ENCOUNTER — Ambulatory Visit (INDEPENDENT_AMBULATORY_CARE_PROVIDER_SITE_OTHER): Payer: Medicare Other | Admitting: Pulmonary Disease

## 2022-01-25 ENCOUNTER — Ambulatory Visit: Payer: Medicare Other | Admitting: Pulmonary Disease

## 2022-01-25 ENCOUNTER — Ambulatory Visit (INDEPENDENT_AMBULATORY_CARE_PROVIDER_SITE_OTHER): Payer: Medicare Other | Admitting: Nurse Practitioner

## 2022-01-25 ENCOUNTER — Encounter: Payer: Self-pay | Admitting: Nurse Practitioner

## 2022-01-25 VITALS — BP 138/68 | HR 104 | Temp 98.3°F | Ht 59.5 in | Wt 103.0 lb

## 2022-01-25 DIAGNOSIS — J432 Centrilobular emphysema: Secondary | ICD-10-CM

## 2022-01-25 DIAGNOSIS — J984 Other disorders of lung: Secondary | ICD-10-CM | POA: Insufficient documentation

## 2022-01-25 DIAGNOSIS — J439 Emphysema, unspecified: Secondary | ICD-10-CM | POA: Insufficient documentation

## 2022-01-25 LAB — PULMONARY FUNCTION TEST
DL/VA % pred: 66 %
DL/VA: 2.83 ml/min/mmHg/L
DLCO cor % pred: 47 %
DLCO cor: 7.75 ml/min/mmHg
DLCO unc % pred: 47 %
DLCO unc: 7.75 ml/min/mmHg
FEF 25-75 Post: 1.28 L/sec
FEF 25-75 Pre: 1.31 L/sec
FEF2575-%Change-Post: -2 %
FEF2575-%Pred-Post: 94 %
FEF2575-%Pred-Pre: 96 %
FEV1-%Change-Post: 0 %
FEV1-%Pred-Post: 82 %
FEV1-%Pred-Pre: 82 %
FEV1-Post: 1.37 L
FEV1-Pre: 1.38 L
FEV1FVC-%Change-Post: 3 %
FEV1FVC-%Pred-Pre: 106 %
FEV6-%Change-Post: -3 %
FEV6-%Pred-Post: 78 %
FEV6-%Pred-Pre: 81 %
FEV6-Post: 1.67 L
FEV6-Pre: 1.73 L
FEV6FVC-%Pred-Post: 105 %
FEV6FVC-%Pred-Pre: 105 %
FVC-%Change-Post: -3 %
FVC-%Pred-Post: 74 %
FVC-%Pred-Pre: 77 %
FVC-Post: 1.67 L
FVC-Pre: 1.73 L
Post FEV1/FVC ratio: 82 %
Post FEV6/FVC ratio: 100 %
Pre FEV1/FVC ratio: 80 %
Pre FEV6/FVC Ratio: 100 %
RV % pred: 38 %
RV: 0.81 L
TLC % pred: 58 %
TLC: 2.55 L

## 2022-01-25 MED ORDER — ALBUTEROL SULFATE HFA 108 (90 BASE) MCG/ACT IN AERS: 2.0000 | INHALATION_SPRAY | Freq: Four times a day (QID) | RESPIRATORY_TRACT | 6 refills | Status: DC | PRN

## 2022-01-25 NOTE — Patient Instructions (Addendum)
Ok to stay off [employer]. Albuterol inhaler 2 puffs every 6 hours as needed for shortness of breath or wheezing. Notify if symptoms persist despite rescue inhaler/neb use or if you are having to use this on a weekly (or more frequent) basis  You have a restriction in your lung function, which is likely related to your scoliosis and large hiatal hernia   Follow up in 6 months with Dr. Erin Fulling. If symptoms do not improve or worsen, please contact office for sooner follow up or seek emergency care.

## 2022-01-25 NOTE — Assessment & Plan Note (Signed)
Severe emphysema on imaging; however, she does not have a formal diagnosis of COPD based on PFTs today. She maintains normal FEV1. I think her DLCO more accurately reflects the severity of her emphysema; however, she does not have any daily symptoms and would prefer to hold on daily bronchodilators. We will provide her with albuterol for rescue purposes. Reviewed proper use. Action plan in place.
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
## 2022-01-25 NOTE — Progress Notes (Signed)
PFT done today. 

## 2022-01-25 NOTE — Assessment & Plan Note (Signed)
Moderate restrictive defect on PFTs today likely secondary to significant scoliosis and large hiatal hernia, which is compressing on her left lung on previous imaging. She has a history of RA documented in her chart but she told me today that she has never been told she has RA and only has osteoarthritis. She does not have any evidence of ILD on recent CT imaging from April 2023 so I think it is appropriate to hold off on further workup for this. Shared decision to move forward with watchful waiting.   Patient Instructions  Ok to stay off Breztri. Albuterol inhaler 2 puffs every 6 hours as needed for shortness of breath or wheezing. Notify if symptoms persist despite rescue inhaler/neb use or if you are having to use this on a weekly (or more frequent) basis  You have a restriction in your lung function, which is likely related to your scoliosis and large hiatal hernia   Follow up in 6 months with Dr. Erin Fulling. If symptoms do not improve or worsen, please contact office for sooner follow up or seek emergency care.

## 2022-01-28 ENCOUNTER — Encounter (HOSPITAL_BASED_OUTPATIENT_CLINIC_OR_DEPARTMENT_OTHER): Payer: Medicare Other | Admitting: General Surgery

## 2022-01-28 ENCOUNTER — Inpatient Hospital Stay: Admit: 2022-01-28 | Payer: Medicare Other | Admitting: Surgery

## 2022-01-28 DIAGNOSIS — L97812 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d: Secondary | ICD-10-CM | POA: Diagnosis not present

## 2022-01-28 SURGERY — REPAIR, HERNIA, HIATAL, ROBOT-ASSISTED
Anesthesia: General

## 2022-01-28 NOTE — Progress Notes (Signed)
[name], [name] ([id number]) [id number] Page 1 of 11 Visit Report for 01/28/2022 Chief Complaint Document Details Patient Name: Date of Service: [name], [name] 01/28/2022 10:30 A M Medical Record Number: [id number] Patient Account Number: [id number] Date of Birth/Sex: Treating RN: December 22, 1945 (77 y.o. F) Primary Care Provider: Domingo Mend Other Clinician: Referring Provider: Treating Provider/Extender: Caprice Kluver in Treatment: 18 Information Obtained from: Patient Chief Complaint 11/15/2020; patient is here for review of a wound area on the right posterior calf 09/20/2021: Patient is here for a nonhealing wound after undergoing Mohs surgery on her right lower leg. 12/07/2021: Reopening of skin at site of previous wound, new wound on right toe, concerning lesion on left upper leg just above knee Electronic Signature(s) Signed: 01/28/2022 11:04:29 AM By: [name] MD FACS Entered By: [name] on 01/28/2022 11:04:29 -------------------------------------------------------------------------------- HPI Details Patient Name: Date of Service: [name]. 01/28/2022 10:30 A M Medical Record Number: [id number] Patient Account Number: [id number] Date of Birth/Sex: Treating RN: [birth year] (77 y.o. F) Primary Care Provider: Domingo Mend Other Clinician: Referring Provider: Treating Provider/Extender: Gevena Cotton Weeks in Treatment: 18 History of Present Illness HPI Description: ADMISSION 11/15/2020 This is a 77 year old woman with a history of squamous cell cancer skin cancer followed by Dr. Anabel Bene dermatology. She describes having a wound on the right posterior calf dating back since September. She saw her primary doctor in early October who suggested petroleum infused gauze. The patient has been doing this. No real improvement in fact she says this is getting larger. She does not  complain much of pain. She is completely uncertain about how this happened there was no trauma she simply became aware of it at some point describing it is about the size of a quarter but it has been getting progressively larger since then. She is here for review of this. Past medical history Crohn's disease not currently on prednisone, gastroesophageal reflux disease, history of squamous cell skin cancer, history of rheumatoid arthritis, hypertension 11/16; this lady had a large superficial atypical wound on the right posterior calf. She has significant solar skin damage probably some degree of venous insufficiency but she described a rapidly expanding wound area. Because of this and a history of skin cancer [name] went ahead and did a shave biopsy but we do not have the results of this at the time we saw her in clinic today. She has been using silver alginate and kerlix 11/30; shave biopsy [name] did last time did not show any malignancy or fungal elements. Suggestion of a stasis ulcer. With our treatment which is simply been silver alginate with kerlix that she is changing daily the wound is down dramatically almost surprising how rapidly it is reduced in surface area 12/14; patient comes in today with her wound healed this was on the right posterior calf. She has significant solar skin damage probably some degree of venous insufficiency however she described a rapidly expanding wound area on admission. Because of this and a history of skin cancer [name] did do a shave biopsy but nothing was really found of worry. 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 Unfortunately, the wound has failed to heal. There is thick slough on  the wound surface. The periwound skin  is in reasonably good condition. There is granulation tissue forming underneath the slough. April, [name] ([id number]) [id number] Page 2 of 11 09/28/2021: The wound has deteriorated rather significantly over the past week. She had fairly copious drainage that she noticed as early as Monday, but she did not contact our office. Today there is a lot of periwound skin breakdown and a very pungent odor coming from the wound. There is thick slough on the wound surface but there is still good granulation tissue underlying this. Despite this, however, the wound actually measures smaller. 10/05/2021: The culture that [name] took grew out MRSA. [name] prescribed doxycycline and she is still taking this. There has been a tremendous improvement in the wound. It is smaller and has contracted considerably. There is hypertrophic granulation tissue present. No malodorous drainage. 10/16/2021: The wound has contracted further and is epithelializing. The surface is very clean and the hypertrophic granulation tissue has not recurred. She completed her course of doxycycline. 10/19; surgical wound on the right medial lower extremity secondary to Mohs surgery done in August. She is using Hydrofera Blue under 3 layer compression and making excellent progress. 11/02/2021: The wound is nearly completely epithelialized. Just a couple of open areas with some eschar overlying the surface. 11/12/2021: Just 1 small open area remains. It is clean without any slough or eschar accumulation. No hypertrophic granulation tissue present. 11/19/2021: Her wound is healed. READMISSION 12/07/2021 The patient has been moving for the past couple of weeks and has spent a substantial amount of time on her feet. As result, she developed leg swelling and the tissue where her wound had been, which she was protecting with a Band-Aid, broke down considerably. She now has a large wound that is irregular with extensive slough,  involving the fat layer. She also developed a blister on her toe that has slough and eschar present. She also pointed out lump just above her left knee that she is concerned about, as she does have a history of multiple skin cancers. 12/11; patient comes in the clinic with a large wound on the right anterior lower leg. This was initially a skin cancer underwent Mohs surgery and was treated in this clinic and healed out on 11/13. The patient states the wound reopened with edema and also trauma from a dressing she was putting on it. 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 She also has a wound on the right second toe. The patient states quite adamantly she will not go back to her previous dermatologist for the skin surgery center in [place] blaming them for the original wound on her right lateral leg. [name] am not exactly sure of her rationale but at this point she certainly will not consider a referral there 12/18; the patient has an appointment at [facility] on 12/28 although [name] am not sure exactly who she is seeing. This is in reference to the skin cancer just above her left patella. She would not go back to the skin surgery or indeed her dermatologist in [place]. She had an area on the right second toe this is healed today. Her original surgical site on the right anterior lateral lower leg actually looks some better. [name] changed her to Iodoflex last week 01/01/2022: The patient has an appointment with plastic surgery on Thursday to evaluate the biopsy-proven squamous cell carcinoma on her left upper leg. The wound on her right lower leg has a thick layer of slough  on the surface and has a very atypical appearance. 01/08/2022: The biopsies that [name] took last week from the wound on her right lower extremity were negative for malignancy but did demonstrate some bacteria along with inflammation. She met with the plastic surgeon on Thursday and she is going to undergo  wide local excision of the biopsy-proven squamous cell carcinoma on her left upper leg. The right lower leg wound is narrower again this week. There is slough and eschar as well as some fibrinous debris in the areas where [name] took the biopsies and obtained hemostasis with silver nitrate. 01/14/2022: The wound is narrower today, but is beginning to accumulate hypertrophic granulation tissue. Thin layer of slough on the surface. There is still slough and fibrinous debris in the 2 biopsy sites. 01/21/2022: The wound continues to contract. There is still slough and nonviable subcutaneous tissue in both biopsy sites. 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 01/28/2022: About half the wound has epithelialized. The remaining open portion has hypertrophic granulation tissue present. Electronic Signature(s) Signed: 01/28/2022 11:04:57 AM By: [name] MD FACS Entered By: [name] on 01/28/2022 11:04:57 -------------------------------------------------------------------------------- Chemical Cauterization Details Patient Name: Date of Service: [name]. 01/28/2022 10:30 A M Medical Record Number: [id number] Patient Account Number: [id number] Date of Birth/Sex: Treating RN: 07-Jun-1945 (77 y.o. [name] Primary Care Provider: Domingo Mend Other Clinician: Referring Provider: Treating Provider/Extender: Gevena Cotton Weeks in Treatment: 18 Procedure Performed for: Wound #2 Right,Lateral Lower Leg Performed By: Physician [name], MD Post Procedure Diagnosis PATRIC, VANPELT ([id number]) [id number] Page 3 of 11 Same as Pre-procedure Notes using silver nitrate stick Electronic Signature(s) Signed: 01/28/2022 11:23:06 AM By: [name] MD FACS Signed:  01/28/2022 5:34:39 PM By: Baruch Gouty RN, BSN Entered By: Baruch Gouty on 01/28/2022 10:50:34 -------------------------------------------------------------------------------- Physical Exam Details Patient Name: Date of Service: [name]. 01/28/2022 10:30 A M Medical Record Number: [id number] Patient Account Number: [id number] Date of Birth/Sex: Treating RN: July 11, 1945 (77 y.o. F) Primary Care Provider: Domingo Mend Other Clinician: Referring Provider: Treating Provider/Extender: Kerry Fort , Olam Idler Weeks in Treatment: 18 Constitutional . . . . no acute distress. Respiratory Normal work of breathing on room air. Notes 01/28/2022: About half the wound has epithelialized. The remaining open portion has hypertrophic granulation tissue present. Electronic Signature(s) Signed: 01/28/2022 11:05:23 AM By: [name] MD FACS Entered By: [name] on 01/28/2022 11:05:23 -------------------------------------------------------------------------------- Physician Orders Details Patient Name: Date of Service: [name]. 01/28/2022 10:30 A M Medical Record Number: [id number] Patient Account Number: [id number] Date of Birth/Sex: Treating RN: 1945/05/31 (77 y.o. [name], [name] Primary Care Provider: Domingo Mend Other Clinician: Referring Provider: Treating Provider/Extender: Caprice Kluver in Treatment: 60 Verbal / Phone Orders: No Diagnosis Coding ICD-10 Coding Code Description [phone] 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d T81.31XS Disruption of external operation (surgical) wound, not elsewhere classified, sequela L98.9 Disorder of the skin and subcutaneous tissue, unspecified C44.722 Squamous cell carcinoma of skin of right lower limb, including hip I10 Essential (primary) hypertension K50.90 Crohn's disease, unspecified, without complications U23  Unspecified severe protein-calorie malnutrition Follow-up Appointments RAINAH, KIRSHNER [name] ([id number]) [id number] Page 4 of 11 ppointment in 1 week. - Dr. [name] Rm 1  Return A Monday 1/29 @ 1030 am Anesthetic (In clinic) Topical Lidocaine 4% applied to wound bed Bathing/ Shower/ Hygiene May shower with protection but do not get wound dressing(s) wet. Protect dressing(s) with water repellant cover (for example, large plastic bag) or a cast cover and may then take shower. Edema Control - Lymphedema / SCD / Other Avoid standing for long periods of time. Moisturize legs daily. Wound Treatment Wound #2 - Lower Leg Wound Laterality: Right, Lateral Cleanser: Soap and Water 1 x Per Week/30 Days Discharge Instructions: May shower and wash wound with dial antibacterial soap and water prior to dressing change. Cleanser: Wound Cleanser 1 x Per Week/30 Days Discharge Instructions: Cleanse the wound with wound cleanser prior to applying a clean dressing using gauze sponges, not tissue or cotton balls. Peri-Wound Care: Sween Lotion (Moisturizing lotion) 1 x Per Week/30 Days Discharge Instructions: Apply moisturizing lotion as directed Topical: Mupirocin Ointment 1 x Per Week/30 Days Discharge Instructions: Apply Mupirocin (Bactroban) as instructed Prim Dressing: Hydrofera Blue Ready Transfer Foam, 2.5x2.5 (in/in) 1 x Per Week/30 Days ary Discharge Instructions: Apply directly to wound bed as directed Secondary Dressing: Woven Gauze Sponge, Non-Sterile 4x4 in 1 x Per Week/30 Days Discharge Instructions: Apply over primary dressing as directed. Compression Wrap: ThreePress (3 layer compression wrap) 1 x Per Week/30 Days Discharge Instructions: Apply three layer compression as directed. Electronic Signature(s) Signed: 01/28/2022 11:23:06 AM By: [name] MD FACS Entered By: [name] on 01/28/2022  11:05:31 -------------------------------------------------------------------------------- Problem List Details Patient Name: Date of Service: [name]. 01/28/2022 10:30 A M Medical Record Number: [id number] Patient Account Number: [id number] Date of Birth/Sex: Treating RN: 10-01-1945 (77 y.o. [name] Primary Care Provider: Domingo Mend Other Clinician: Referring Provider: Treating Provider/Extender: Gevena Cotton Weeks in Treatment: 18 Active Problems ICD-10 Encounter Code Description Active Date MDM Diagnosis L97.812 Non-pressure chronic ulcer of other part of right lower leg with fat layer 09/20/2021 No Yes exposed T81.31XS Disruption of external operation (surgical) wound, not elsewhere classified, 09/20/2021 No Yes sequela L98.9 Disorder of the skin and subcutaneous tissue, unspecified 12/07/2021 No Yes [name], [name] ([id number]) [phone].pdf Page 5 of 11 [phone] Squamous cell carcinoma of skin of right lower limb, including hip 09/20/2021 No Yes I10 Essential (primary) hypertension 09/20/2021 No Yes K50.90 Crohn's disease, unspecified, without complications 1/82/9937 No Yes E43 Unspecified severe protein-calorie malnutrition 09/20/2021 No Yes Inactive Problems ICD-10 Code Description Active Date Inactive Date L97.512 Non-pressure chronic ulcer of other part of right foot with fat layer exposed 12/07/2021 12/07/2021 Resolved Problems Electronic Signature(s) Signed: 01/28/2022 11:03:11 AM By: [name] MD FACS Entered By: [name] on 01/28/2022 11:03:11 -------------------------------------------------------------------------------- Progress Note Details Patient Name: Date of Service: [name]. 01/28/2022 10:30 A M Medical Record Number: [id number] Patient Account Number: [id number] Date of Birth/Sex: Treating RN: [birth year] (77 y.o. F) Primary Care Provider: Domingo Mend Other  Clinician: Referring Provider: Treating Provider/Extender: Caprice Kluver in Treatment: 18 Subjective Chief Complaint Information obtained from Patient 11/15/2020; patient is here for review of a wound area on the right posterior calf 09/20/2021: Patient is here for a nonhealing wound after undergoing Mohs surgery on her right lower leg. 12/07/2021: Reopening of skin at site of previous wound, new wound on right toe, concerning lesion on left upper leg just above knee History of Present Illness (HPI) ADMISSION 11/15/2020 This is a 77 year old woman with a history of squamous cell cancer skin cancer followed by  Dr. Anabel Bene dermatology. She describes having a wound on the right posterior calf dating back since September. She saw her primary doctor in early October who suggested petroleum infused gauze. The patient has been doing this. No real improvement in fact she says this is getting larger. She does not complain much of pain. She is completely uncertain about how this happened there was no trauma she simply became aware of it at some point describing it is about the size of a quarter but it has been getting progressively larger since then. She is here for review of this. Past medical history Crohn's disease not currently on prednisone, gastroesophageal reflux disease, history of squamous cell skin cancer, history of rheumatoid arthritis, hypertension 11/16; this lady had a large superficial atypical wound on the right posterior calf. She has significant solar skin damage probably some degree of venous insufficiency but she described a rapidly expanding wound area. Because of this and a history of skin cancer [name] went ahead and did a shave biopsy but we do not have the results of this at the time we saw her in clinic today. She has been using silver alginate and kerlix 11/30; shave biopsy [name] did last time did not show any malignancy or fungal elements. Suggestion of a  stasis ulcer. With our treatment which is simply been silver alginate with kerlix that she is changing daily the wound is down dramatically almost surprising how rapidly it is reduced in surface area 12/14; patient comes in today with her wound healed this was on the right posterior calf. She has significant solar skin damage probably some degree of venous insufficiency however she described a rapidly expanding wound area on admission. Because of this and a history of skin cancer [name] did do a shave [name], [name] ([id number]) [id number] Page 6 of 11 biopsy but nothing was really found of worry. 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 Unfortunately, the wound has failed to heal. There is thick slough on the wound surface. The periwound skin is in reasonably good condition. There is granulation tissue forming underneath the slough. 09/28/2021: The wound has deteriorated rather significantly over the past week. She had fairly copious drainage that she noticed as early as Monday, but she did not contact our office. Today there is a lot of periwound skin breakdown and a very pungent odor coming from the wound. There is thick slough on the wound surface but there is still good granulation tissue underlying this. Despite this, however, the wound actually measures smaller. 10/05/2021: The culture that [name] took grew out MRSA. [name] prescribed doxycycline and she is still taking this. There has been a tremendous improvement in the wound. It is smaller and has contracted considerably. There is hypertrophic granulation tissue present. No malodorous drainage. 10/16/2021: The wound has contracted further and is epithelializing. The surface is  very clean and the hypertrophic granulation tissue has not recurred. She completed her course of doxycycline. 10/19; surgical wound on the right medial lower extremity secondary to Mohs surgery done in August. She is using Hydrofera Blue under 3 layer compression and making excellent progress. 11/02/2021: The wound is nearly completely epithelialized. Just a couple of open areas with some eschar overlying the surface. 11/12/2021: Just 1 small open area  remains. It is clean without any slough or eschar accumulation. No hypertrophic granulation tissue present. 11/19/2021: Her wound is healed. READMISSION 12/07/2021 The patient has been moving for the past couple of weeks and has spent a substantial amount of time on her feet. As result, she developed leg swelling and the tissue where her wound had been, which she was protecting with a Band-Aid, broke down considerably. She now has a large wound that is irregular with extensive slough, involving the fat layer. She also developed a blister on her toe that has slough and eschar present. She also pointed out lump just above her left knee that she is concerned about, as she does have a history of multiple skin cancers. 12/11; patient comes in the clinic with a large wound on the right anterior lower leg. This was initially a skin cancer underwent Mohs surgery and was treated in this clinic and healed out on 11/13. The patient states the wound reopened with edema and also trauma from a dressing she was putting on it. 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 She also has a wound on the right second toe. The patient states quite adamantly she will not go back to her previous dermatologist for the skin surgery center in [place] blaming them for the original wound on her right lateral leg. [name] am not exactly sure of her rationale but at this point she certainly will not consider a referral there 12/18; the patient has  an appointment at [facility] on 12/28 although [name] am not sure exactly who she is seeing. This is in reference to the skin cancer just above her left patella. She would not go back to the skin surgery or indeed her dermatologist in [place]. She had an area on the right second toe this is healed today. Her original surgical site on the right anterior lateral lower leg actually looks some better. [name] changed her to Iodoflex last week 01/01/2022: The patient has an appointment with plastic surgery on Thursday to evaluate the biopsy-proven squamous cell carcinoma on her left upper leg. The wound on her right lower leg has a thick layer of slough on the surface and has a very atypical appearance. 01/08/2022: The biopsies that [name] took last week from the wound on her right lower extremity were negative for malignancy but did demonstrate some bacteria along with inflammation. She met with the plastic surgeon on Thursday and she is going to undergo wide local excision of the biopsy-proven squamous cell carcinoma on her left upper leg. The right lower leg wound is narrower again this week. There is slough and eschar as well as some fibrinous debris in the areas where [name] took the biopsies and obtained hemostasis with silver nitrate. 01/14/2022: The wound is narrower today, but is beginning to accumulate hypertrophic granulation tissue. Thin layer of slough on the surface. There is still slough and fibrinous debris in the 2 biopsy sites. 01/21/2022: The wound continues to contract. There is still slough and nonviable subcutaneous tissue in both biopsy sites. 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 01/28/2022: About half the wound has epithelialized. The remaining open portion has hypertrophic granulation tissue present. Patient History Information obtained from  Patient. Family History Diabetes - Father, Heart Disease - Father, Hypertension - Father, No family history of Cancer, Hereditary  Spherocytosis, Kidney Disease, Lung Disease, Seizures, Stroke, Thyroid Problems, Tuberculosis. Social History Former smoker - quit 30 years ago, Marital Status - Married, Alcohol Use - Never, Drug Use - No History, Caffeine Use - Rarely. Medical History Eyes Patient has history of Cataracts - Had cataract surgery Denies history of Glaucoma, Optic Neuritis Ear/Nose/Mouth/Throat Patient has history of Chronic sinus problems/congestion - Post Nasal drip Denies history of Middle ear problems Hematologic/Lymphatic Denies history of Anemia, Hemophilia, Human Immunodeficiency Virus, Lymphedema, Sickle Cell Disease Respiratory Denies history of Aspiration, Asthma, Chronic Obstructive Pulmonary Disease (COPD), Pneumothorax, Sleep Apnea, Tuberculosis Cardiovascular Patient has history of Hypertension Gastrointestinal Patient has history of Crohnoos - Has Chrons Disease [name], [name] ([id number]) [id number] Page 7 of 11 Denies history of Cirrhosis , Colitis, Hepatitis A, Hepatitis B, Hepatitis C Endocrine Denies history of Type [name] Diabetes, Type II Diabetes Genitourinary Denies history of End Stage Renal Disease Immunological Denies history of Lupus Erythematosus, Raynaudoos, Scleroderma Musculoskeletal Patient has history of Osteoarthritis - Knees Denies history of Gout, Rheumatoid Arthritis, Osteomyelitis Neurologic Denies history of Dementia, Neuropathy, Quadriplegia, Paraplegia, Seizure Disorder Oncologic Denies history of Received Chemotherapy, Received Radiation Psychiatric Denies history of Anorexia/bulimia, Confinement Anxiety Hospitalization/Surgery History - bil TKA. - Moh's procedure right leg 08/17/21. - bil cataract extractions. - nasal fx surgery. Medical A Surgical History Notes nd Gastrointestinal Has a large  hiatal hernia. Has Chrons Disease., GERD, pancreatic insufficiency Endocrine Pancreatic Insufficiency Genitourinary stage 3 Chronic Kidney disease Integumentary (Skin) Had Squamous Cell (skin cancer). Had a skin graft on Right knee (area) Musculoskeletal Had Bil knee replacements, hx cervical spine fx, sternal fx, thoracis spine fx Neurologic left foot drop Oncologic Had Squamous Cell (skin cell) Objective Constitutional no acute distress. Vitals Time Taken: 10:31 AM, Height: 60 in, Weight: 101 lbs, BMI: 19.7, Temperature: 97.7 F, Pulse: 89 bpm, Respiratory Rate: 18 breaths/min, Blood Pressure: 127/69 mmHg. Respiratory Normal work of breathing on room air. General Notes: 01/28/2022: About half the wound has epithelialized. The remaining open portion has hypertrophic granulation tissue present. Integumentary (Hair, Skin) Wound #2 status is Open. Original cause of wound was Surgical Injury. The date acquired was: 08/17/2021. The wound has been in treatment 18 weeks. The wound is located on the Right,Lateral Lower Leg. The wound measures 2.5cm length x 1.3cm width x 0.1cm depth; 2.553cm^2 area and 0.255cm^3 volume. There is Fat Layer (Subcutaneous Tissue) exposed. There is no tunneling or undermining noted. There is a medium amount of serosanguineous drainage noted. The wound margin is flat and intact. There is large (67-100%) red, hyper - granulation within the wound bed. There is a small (1-33%) amount of necrotic tissue within the wound bed including Adherent Slough. The periwound skin appearance had no abnormalities noted for texture. The periwound skin appearance had no abnormalities noted for moisture. The periwound skin appearance had no abnormalities noted for color. Periwound temperature was noted as No Abnormality. 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 There was a Three Layer Compression Therapy Procedure by Baruch Gouty, RN. Post procedure Diagnosis Wound #  2: Same as Pre-Procedure Pre-procedure diagnosis of Wound #2 is an Open Surgical Wound located on the Right,Lateral Lower Leg . An Chemical Cauterization procedure was performed by [name], MD. Post procedure Diagnosis Wound #2: Same as Pre-Procedure Notes: using silver nitrate stick Plan Follow-up Appointments: Return Appointment in 1 week. - Dr. [name] Rm 1 Monday 1/29 @ 1030 am Anesthetic: (In clinic) Topical Lidocaine 4% applied to wound bed Bathing/ Shower/ Hygiene: May shower with protection but do not get wound dressing(s) wet. Protect dressing(s) with water repellant cover (for example, large plastic bag) or a cast cover and may then take shower. Edema Control - Lymphedema / SCD / Other: Avoid standing for long periods of time. Moisturize legs daily. WOUND #2: - Lower Leg Wound Laterality: Right, Lateral Cleanser: Soap and Water 1 x Per Week/30 Days Discharge Instructions: May shower and wash wound with dial antibacterial soap and water prior to dressing change. Cleanser: Wound Cleanser 1 x Per Week/30 Days Discharge Instructions: Cleanse the wound with wound cleanser prior to applying a clean dressing using gauze sponges, not tissue or cotton balls. Peri-Wound Care: Sween Lotion (Moisturizing lotion) 1 x Per Week/30 Days Discharge Instructions: Apply moisturizing  lotion as directed Topical: Mupirocin Ointment 1 x Per Week/30 Days Discharge Instructions: Apply Mupirocin (Bactroban) as instructed Prim Dressing: Hydrofera Blue Ready Transfer Foam, 2.5x2.5 (in/in) 1 x Per Week/30 Days ary Discharge Instructions: Apply directly to wound bed as directed Secondary Dressing: Woven Gauze Sponge, Non-Sterile 4x4 in 1 x Per Week/30 Days Discharge Instructions: Apply over primary dressing as directed. Com pression Wrap: ThreePress (3 layer compression wrap) 1 x Per Week/30 Days Discharge Instructions: Apply three layer compression as directed. 01/28/2022: About half the wound has epithelialized. The remaining open portion has hypertrophic granulation tissue present. No debridement was necessary today. [name] used silver nitrate to chemically cauterize the hypertrophic granulation tissue. We will continue topical mupirocin with Hydrofera Blue and 3 layer compression. Follow-up in 1 week. Electronic Signature(s) Signed: 01/28/2022 11:06:07 AM By: [name] MD FACS Entered By: [name] on 01/28/2022 11:06:07 -------------------------------------------------------------------------------- HxROS Details Patient Name: Date of Service: [name]. 01/28/2022 10:30 A M Medical Record Number: [id number] Patient Account Number: [id number] Date of Birth/Sex: Treating RN: 1945-02-28 (78 y.o. F) Primary Care Provider: Domingo Mend Other Clinician: Referring Provider: Treating Provider/Extender: Gevena Cotton Weeks in Treatment: 18 Information Obtained From Patient Eyes Medical History: [name], [name] ([id number]) [id number] Page 9 of 11 Positive for: Cataracts - Had cataract surgery Negative for: Glaucoma; Optic Neuritis Ear/Nose/Mouth/Throat Medical History: Positive for: Chronic sinus problems/congestion - Post Nasal drip Negative for: Middle ear problems Hematologic/Lymphatic Medical  History: Negative for: Anemia; Hemophilia; Human Immunodeficiency Virus; Lymphedema; Sickle Cell Disease Respiratory Medical History: Negative for: Aspiration; Asthma; Chronic Obstructive Pulmonary Disease (COPD); Pneumothorax; Sleep Apnea; Tuberculosis Cardiovascular Medical History: Positive for: Hypertension Gastrointestinal Medical History: Positive for: Crohns - Has Chrons Disease Negative for: Cirrhosis ; Colitis; Hepatitis A; Hepatitis B; Hepatitis C Past Medical History Notes: Has a large hiatal hernia. Has Chrons Disease., GERD, pancreatic insufficiency Endocrine Medical History: Negative for: Type [name] Diabetes; Type II Diabetes Past Medical History Notes: Pancreatic Insufficiency Genitourinary Medical History: Negative for: End Stage Renal Disease Past Medical History Notes: stage 3 Chronic Kidney disease Immunological Medical History: Negative for: Lupus Erythematosus; Raynauds; Scleroderma Integumentary (Skin) Medical History: Past Medical History Notes: Had Squamous Cell (skin cancer). Had a skin graft on Right knee (area) Musculoskeletal Medical History: Positive for: Osteoarthritis -  Knees Negative for: Gout; Rheumatoid Arthritis; Osteomyelitis Past Medical History Notes: Had Bil knee replacements, hx cervical spine fx, sternal fx, thoracis spine fx Neurologic Medical History: Negative for: Dementia; Neuropathy; Quadriplegia; Paraplegia; Seizure Disorder Past Medical History Notes: left foot drop Oncologic Medical History: Negative for: Received Chemotherapy; Received Radiation Past Medical History Notes: Had Squamous Cell (skin cell) [name], [name] ([id number]) [id number].pdf Page 10 of 11 Psychiatric Medical History: Negative for: Anorexia/bulimia; Confinement Anxiety HBO Extended History Items Ear/Nose/Mouth/Throat: Eyes: Chronic sinus Cataracts problems/congestion Immunizations Pneumococcal Vaccine: Received  Pneumococcal Vaccination: Yes Received Pneumococcal Vaccination On or After 60th Birthday: Yes Tetanus Vaccine: Last tetanus shot: 11/16/2011 Implantable Devices None Hospitalization / Surgery History Type of Hospitalization/Surgery bil TKA Moh's procedure right leg 08/17/21 bil cataract extractions nasal fx surgery Family and Social History Cancer: No; Diabetes: Yes - Father; Heart Disease: Yes - Father; Hereditary Spherocytosis: No; Hypertension: Yes - Father; Kidney Disease: No; Lung Disease: No; Seizures: No; Stroke: No; Thyroid Problems: No; Tuberculosis: No; Former smoker - quit 30 years ago; Marital Status - Married; Alcohol Use: Never; Drug Use: No History; Caffeine Use: Rarely; Financial Concerns: No; Food, Clothing or Shelter Needs: No; Support System Lacking: No; Transportation Concerns: No Electronic Signature(s) Signed: 01/28/2022 11:23:06 AM By: [name] MD FACS Entered By: [name] on 01/28/2022 11:05:03 -------------------------------------------------------------------------------- SuperBill Details Patient Name: Date of Service: [name]. 01/28/2022 Medical Record Number: [id number] Patient Account Number: [id number] Date of Birth/Sex: Treating RN: 1945/03/07 (77 y.o. 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, including hip I10 Essential (primary) hypertension K50.90 Crohn's disease, unspecified, without complications I62 Unspecified severe protein-calorie  malnutrition Facility Procedures : Brault, BER CPT4 Code: [id number] 1 TINE [name] ([id number] Description: 6967 - CHEM CAUT GRANULATION TISS ICD-10 Diagnosis Description L97.812 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d ) [id number] Modifier: [id number] Quantity: 1 7.pdf Page 11 of 11 : CPT4 Code: [id number] (F Description: acility Use Only) 82423NT - APPLY MULTLAY COMPRS LWR RT LEG Modifier: 59 1 Quantity: Physician Procedures : CPT4 Code Description Modifier [id number] 40086 - WC PHYS LEVEL 4 - EST PT 25 ICD-10 Diagnosis Description P61.950 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d T81.31XS Disruption of external operation (surgical)  wound, not elsewhere classified, sequela E43 Unspecified severe protein-calorie malnutrition I10 Essential (primary) hypertension Quantity: 1 : [id number] - WC PHYS CHEM CAUT GRAN TISSUE ICD-10 Diagnosis Description X83.382 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d Quantity: 1 Electronic Signature(s) Signed: 01/28/2022 5:26:04 PM By: [name] MD FACS Signed: 01/28/2022 5:34:39 PM By: Baruch Gouty RN, BSN Previous Signature: 01/28/2022 11:06:39 AM Version By: [name] MD FACS Entered By: Baruch Gouty on 01/28/2022 17:05:58

## 2022-01-28 NOTE — Progress Notes (Signed)
TAVI, GAUGHRAN I ([id number]) [id number] Page 1 of 7 Visit Report for 01/28/2022 Arrival Information Details Patient Name: Date of Service: [name], [name] 01/28/2022 10:30 A M Medical Record Number: [id number] Patient Account Number: [id number] Date of Birth/Sex: Treating RN: 11/08/1945 (77 y.o. [name], [name] Primary Care Zakaiya Lares: Domingo Mend Other Clinician: Referring Ardine Iacovelli: Treating Amaurie Wandel/Extender: Caprice Kluver in Treatment: 18 Visit Information History Since Last Visit Added or deleted any medications: No Patient Arrived: Ambulatory Any new allergies or adverse reactions: No Arrival Time: 10:30 Had a fall or experienced change in No Accompanied By: self activities of daily living that may affect Transfer Assistance: None risk of falls: Patient Identification Verified: Yes Signs or symptoms of abuse/neglect since last visito No Secondary Verification Process Completed: Yes Hospitalized since last visit: No Patient Requires Transmission-Based Precautions: No Implantable device outside of the clinic excluding No Patient Has Alerts: No cellular tissue based products placed in the center since last visit: Has Dressing in Place as Prescribed: Yes Has Compression in Place as Prescribed: Yes Pain Present Now: No Electronic Signature(s) Signed: 01/28/2022 5:34:39 PM By: Baruch Gouty RN, BSN Entered By: Baruch Gouty on 01/28/2022 10:31:32 -------------------------------------------------------------------------------- Compression Therapy Details Patient Name: Date of Service: April Austin I. 01/28/2022 10:30 A M Medical Record Number: [id number] Patient Account Number: [id number] Date of Birth/Sex: Treating RN: 1945/11/24 (77 y.o. [name] Primary Care Saharra Santo: Domingo Mend Other Clinician: Referring Arriah Wadle: Treating Marcelles Clinard/Extender: Gevena Cotton Weeks in  Treatment: 18 Compression Therapy Performed for Wound Assessment: Wound #2 Right,Lateral Lower Leg Performed By: Clinician Baruch Gouty, RN Compression Type: Three Layer Post Procedure Diagnosis Same as Pre-procedure Electronic Signature(s) Signed: 01/28/2022 5:34:39 PM By: Baruch Gouty RN, BSN Entered By: Baruch Gouty on 01/28/2022 10:49:23 Alejo, Parks Ranger ([id number]) [id number] Page 2 of 7 -------------------------------------------------------------------------------- Encounter Discharge Information Details Patient Name: Date of Service: [name], [name] 01/28/2022 10:30 A M Medical Record Number: [id number] Patient Account Number: [id number] Date of Birth/Sex: Treating RN: February 14, 1945 (77 y.o. [name] Primary Care Candelaria Pies: Domingo Mend Other Clinician: Referring Joby Hershkowitz: Treating Kamri Gotsch/Extender: Caprice Kluver in Treatment: 18 Encounter Discharge Information Items Discharge Condition: Stable Ambulatory Status: Ambulatory Discharge Destination: Home Transportation: Private Auto Accompanied By: self Schedule Follow-up Appointment: Yes Clinical Summary of Care: Patient Declined Electronic Signature(s) Signed: 01/28/2022 5:34:39 PM By: Baruch Gouty RN, BSN Entered By: Baruch Gouty on 01/28/2022 17:06:27 -------------------------------------------------------------------------------- Lower Extremity Assessment Details Patient Name: Date of Service: April Austin I. 01/28/2022 10:30 A M Medical Record Number: [id number] Patient Account Number: [id number] Date of Birth/Sex: Treating RN: [birth year] (77 y.o. [name] Primary Care Jacci Ruberg: Domingo Mend Other Clinician: Referring Shields Pautz: Treating Zacarias Krauter/Extender: Gevena Cotton Weeks in Treatment: 18 Edema Assessment Assessed: [Left: No] [Right: No] Edema: [Left: N] [Right: o] Calf Left:  Right: Point of Measurement: From Medial Instep 27.5 cm Ankle Left: Right: Point of Measurement: From Medial Instep 18.3 cm Vascular Assessment Pulses: Dorsalis Pedis Palpable: [Right:Yes] Electronic Signature(s) Signed: 01/28/2022 5:34:39 PM By: Baruch Gouty RN, BSN Entered By: Baruch Gouty on 01/28/2022 10:33:44 Multi Wound Chart Details -------------------------------------------------------------------------------- [name] ([id number]) [id number] Page 3 of 7 Patient Name: Date of Service: [name] 01/28/2022 10:30 A M Medical Record Number: [id number] Patient Account Number: [id number] Date of Birth/Sex: Treating RN: 1945-02-27 (77 y.o. F) Primary Care Keanan Melander: Domingo Mend Other Clinician: Referring Baileigh Modisette: Treating  Brittie Whisnant/Extender: Gevena Cotton Weeks in Treatment: 18 Vital Signs Height(in): 60 Pulse(bpm): 18 Weight(lbs): 101 Blood Pressure(mmHg): 127/69 Body Mass Index(BMI): 19.7 Temperature(F): 97.7 Respiratory Rate(breaths/min): 18 Wound Assessments Wound Number: 2 N/A N/A Photos: N/A N/A Right, Lateral Lower Leg N/A N/A Wound Location: Surgical Injury N/A N/A Wounding Event: Open Surgical Wound N/A N/A Primary Etiology: Venous Leg Ulcer N/A N/A Secondary Etiology: Cataracts, Chronic sinus N/A N/A Comorbid History: problems/congestion, Hypertension, Crohns, Osteoarthritis 08/17/2021 N/A N/A Date Acquired: 74 N/A N/A Weeks of Treatment: Open N/A N/A Wound Status: No N/A N/A Wound Recurrence: Yes N/A N/A Clustered Wound: 2.5x1.3x0.1 N/A N/A Measurements L x W x D (cm) 2.553 N/A N/A A (cm) : rea 0.255 N/A N/A Volume (cm) : 85.90% N/A N/A % Reduction in Area: 85.90% N/A N/A % Reduction in Volume: Full Thickness Without Exposed N/A N/A Classification: Support Structures Medium N/A N/A Exudate Amount: Serosanguineous N/A N/A Exudate Type: red, brown N/A  N/A Exudate Color: Flat and Intact N/A N/A Wound Margin: Large (67-100%) N/A N/A Granulation Amount: Red, Hyper-granulation N/A N/A Granulation Quality: Small (1-33%) N/A N/A Necrotic Amount: Fat Layer (Subcutaneous Tissue): Yes N/A N/A Exposed Structures: Fascia: No Tendon: No Muscle: No Joint: No Bone: No Small (1-33%) N/A N/A Epithelialization: Excoriation: No N/A N/A Periwound Skin Texture: Induration: No Callus: No Crepitus: No Rash: No Scarring: No Dry/Scaly: No N/A N/A Periwound Skin Moisture: Atrophie Blanche: No N/A N/A Periwound Skin Color: Cyanosis: No Ecchymosis: No Erythema: No Hemosiderin Staining: No Mottled: No Pallor: No Rubor: No No Abnormality N/A N/A Temperature: Chemical Cauterization N/A N/A Procedures Performed: Compression Therapy Treatment Notes Electronic Signature(s) REBACCA, VOTAW I ([id number]) [id number] Page 4 of 7 Signed: 01/28/2022 11:04:21 AM By: [name] MD FACS Entered By: [name] on 01/28/2022 11:04:21 -------------------------------------------------------------------------------- Multi-Disciplinary Care Plan Details Patient Name: Date of Service: April Austin I. 01/28/2022 10:30 A M Medical Record Number: [id number] Patient Account Number: [id number] Date of Birth/Sex: Treating RN: 1945-06-26 (77 y.o. [name] Primary Care Kyan Giannone: Domingo Mend Other Clinician: Referring Shabria Egley: Treating Kensleigh Gates/Extender: Caprice Kluver in Treatment: [place] reviewed with physician Active Inactive Abuse / Safety / Falls / Self Care Management Nursing Diagnoses: History of Falls Potential for falls Goals: Patient will not experience any injury related to falls Date Initiated: 12/07/2021 Target Resolution Date: 02/25/2022 Goal Status: Active Patient/caregiver will verbalize/demonstrate measures taken to prevent injury  and/or falls Date Initiated: 09/20/2021 Date Inactivated: 10/19/2021 Target Resolution Date: 10/18/2021 Goal Status: Met Interventions: Assess fall risk on admission and as needed Assess impairment of mobility on admission and as needed per policy Notes: Pressure Nursing Diagnoses: Knowledge deficit related to causes and risk factors for pressure ulcer development Goals: Patient will remain free of pressure ulcers Date Initiated: 12/07/2021 Target Resolution Date: 02/25/2022 Goal Status: Active Interventions: Assess potential for pressure ulcer upon admission and as needed Notes: Electronic Signature(s) Signed: 01/28/2022 5:34:39 PM By: Baruch Gouty RN, BSN Entered By: Baruch Gouty on 01/28/2022 10:42:39 -------------------------------------------------------------------------------- Pain Assessment Details Patient Name: Date of Service: April Austin I. 01/28/2022 10:30 A M Medical Record Number: [id number] Patient Account Number: [id number] KILEEN, LANGE I ([id number]) [phone].pdf Page 5 of 7 Date of Birth/Sex: Treating RN: 1945-12-08 (77 y.o. [name] Primary Care Makeya Hilgert: Other Clinician: Domingo Mend Referring Calaya Gildner: Treating Odyssey Vasbinder/Extender: Gevena Cotton Weeks in Treatment: 18 Active Problems Location of Pain Severity and Description of Pain Patient Has Paino No Site  Locations Rate the pain. Current Pain Level: 0 Pain Management and Medication Current Pain Management: Electronic Signature(s) Signed: 01/28/2022 5:34:39 PM By: Baruch Gouty RN, BSN Entered By: Baruch Gouty on 01/28/2022 10:32:13 -------------------------------------------------------------------------------- Patient/Caregiver Education Details Patient Name: Date of Service: April Austin I. 1/22/2024andnbsp10:30 A M Medical Record Number: [id number] Patient Account Number: [id number] Date of Birth/Gender: Treating  RN: 1945/05/19 (77 y.o. [name] Primary Care Physician: Domingo Mend Other Clinician: Referring Physician: Treating Physician/Extender: Caprice Kluver in Treatment: 18 Education Assessment Education Provided To: Patient Education Topics Provided Venous: Methods: Explain/Verbal Responses: Reinforcements needed, State content correctly Wound Debridement: Methods: Explain/Verbal Responses: Reinforcements needed, State content correctly Wound/Skin Impairment: Methods: Explain/Verbal Responses: Reinforcements needed, State content correctly KLYNN, LINNEMANN I ([id number]) [phone].pdf Page 6 of 7 Electronic Signature(s) Signed: 01/28/2022 5:34:39 PM By: Baruch Gouty RN, BSN Entered By: Baruch Gouty on 01/28/2022 10:43:36 -------------------------------------------------------------------------------- Wound Assessment Details Patient Name: Date of Service: April Austin I. 01/28/2022 10:30 A M Medical Record Number: [id number] Patient Account Number: [id number] Date of Birth/Sex: Treating RN: December 19, 1945 (77 y.o. [name], [name] Primary Care Ivalee Strauser: Domingo Mend Other Clinician: Referring Keyron Pokorski: Treating Jarren Para/Extender: Gevena Cotton Weeks in Treatment: 18 Wound Status Wound Number: 2 Primary Open Surgical Wound Etiology: Wound Location: Right, Lateral Lower Leg Secondary Venous Leg Ulcer Wounding Event: Surgical Injury Etiology: Date Acquired: 08/17/2021 Wound Status: Open Weeks Of Treatment: 18 Comorbid Cataracts, Chronic sinus problems/congestion, Hypertension, Clustered Wound: Yes History: Crohns, Osteoarthritis Photos Wound Measurements Length: (cm) 2.5 Width: (cm) 1.3 Depth: (cm) 0.1 Area: (cm) 2.553 Volume: (cm) 0.255 % Reduction in Area: 85.9% % Reduction in Volume: 85.9% Epithelialization: Small (1-33%) Tunneling: No Undermining: No Wound  Description Classification: Full Thickness Without Exposed Suppor Wound Margin: Flat and Intact Exudate Amount: Medium Exudate Type: Serosanguineous Exudate Color: red, brown t Structures Foul Odor After Cleansing: No Slough/Fibrino Yes Wound Bed Granulation Amount: Large (67-100%) Exposed Structure Granulation Quality: Red, Hyper-granulation Fascia Exposed: No Necrotic Amount: Small (1-33%) Fat Layer (Subcutaneous Tissue) Exposed: Yes Necrotic Quality: Adherent Slough Tendon Exposed: No Muscle Exposed: No Joint Exposed: No Bone Exposed: No Periwound Skin Texture Texture Color No Abnormalities Noted: Yes No Abnormalities Noted: Yes Moisture Temperature / Pain No Abnormalities Noted: Yes Temperature: No Abnormality Wareing, Alandra I ([id number]) [id number] Page 7 of 7 Treatment Notes Wound #2 (Lower Leg) Wound Laterality: Right, Lateral Cleanser Soap and Water Discharge Instruction: May shower and wash wound with dial antibacterial soap and water prior to dressing change. Wound Cleanser Discharge Instruction: Cleanse the wound with wound cleanser prior to applying a clean dressing using gauze sponges, not tissue or cotton balls. Peri-Wound Care Sween Lotion (Moisturizing lotion) Discharge Instruction: Apply moisturizing lotion as directed Topical Mupirocin Ointment Discharge Instruction: Apply Mupirocin (Bactroban) as instructed Primary Dressing Hydrofera Blue Ready Transfer Foam, 2.5x2.5 (in/in) Discharge Instruction: Apply directly to wound bed as directed Secondary Dressing Woven Gauze Sponge, Non-Sterile 4x4 in Discharge Instruction: Apply over primary dressing as directed. Secured With Compression Wrap ThreePress (3 layer compression wrap) Discharge Instruction: Apply three layer compression as directed. Compression Stockings Add-Ons Electronic Signature(s) Signed: 01/28/2022 5:34:39 PM By: Baruch Gouty RN, BSN Entered By: Baruch Gouty on 01/28/2022 10:38:55 -------------------------------------------------------------------------------- Vitals Details Patient Name: Date of Service: April Austin I. 01/28/2022 10:30 A M Medical Record Number: [id number] Patient Account Number: [id number] Date of Birth/Sex: Treating RN: 09/01/1945 (77 y.o. [name] Primary Care Aceyn Kathol: Domingo Mend Other Clinician: Referring Ayauna Mcnay:  Treating Fonda Rochon/Extender: Gevena Cotton Weeks in Treatment: 18 Vital Signs Time Taken: 10:31 Temperature (F): 97.7 Height (in): 60 Pulse (bpm): 89 Weight (lbs): 101 Respiratory Rate (breaths/min): 18 Body Mass Index (BMI): 19.7 Blood Pressure (mmHg): 127/69 Reference Range: 80 - 120 mg / dl Electronic Signature(s) Signed: 01/28/2022 5:34:39 PM By: Baruch Gouty RN, BSN Entered By: Baruch Gouty on 01/28/2022 10:31:57

## 2022-02-04 ENCOUNTER — Encounter (HOSPITAL_BASED_OUTPATIENT_CLINIC_OR_DEPARTMENT_OTHER): Payer: Medicare Other | Admitting: General Surgery

## 2022-02-04 DIAGNOSIS — L97812 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d: Secondary | ICD-10-CM | POA: Diagnosis not present

## 2022-02-04 NOTE — Progress Notes (Signed)
[name] ([id number]) [id number] Page 1 of 8 Visit Report for 02/04/2022 Arrival Information Details Patient Name: Date of Service: [name], [name] 02/04/2022 10:30 A M Medical Record Number: [id number] Patient Account Number: [id number] Date of Birth/Sex: Treating RN: 1945/12/13 (77 y.o. Iver Nestle, Napoleon Primary Care Kaylib Furness: Domingo Mend Other Clinician: Referring Aviyon Hocevar: Treating Leander Tout/Extender: Caprice Kluver in Treatment: 19 Visit Information History Since Last Visit Added or deleted any medications: No Patient Arrived: Ambulatory Any new allergies or adverse reactions: No Arrival Time: 10:32 Had a fall or experienced change in No Accompanied By: spouse activities of daily living that may affect Transfer Assistance: None risk of falls: Patient Requires Transmission-Based Precautions: No Signs or symptoms of abuse/neglect since last visito No Patient Has Alerts: No Hospitalized since last visit: No Implantable device outside of the clinic excluding No cellular tissue based products placed in the center since last visit: Has Compression in Place as Prescribed: Yes Pain Present Now: No Electronic Signature(s) Signed: 02/04/2022 4:55:35 PM By: Blanche East RN Entered By: Blanche East on 02/04/2022 10:32:59 -------------------------------------------------------------------------------- Compression Therapy Details Patient Name: Date of Service: April Austin [name]. 02/04/2022 10:30 A M Medical Record Number: [id number] Patient Account Number: [id number] Date of Birth/Sex: Treating RN: [birth year] (77 y.o. [name] Primary Care Kaemon Barnett: Domingo Mend Other Clinician: Referring Bryler Dibble: Treating Krissie Merrick/Extender: Gevena Cotton Weeks in Treatment: 19 Compression Therapy Performed for Wound Assessment: Wound #2 Right,Lateral Lower Leg Performed By: Clinician Baruch Gouty, RN Compression Type: Three Layer Post Procedure Diagnosis Same as Pre-procedure Electronic Signature(s) Signed: 02/04/2022 4:55:35 PM By: Blanche East RN Entered By: Blanche East on 02/04/2022 10:49:28 Norva Riffle ([id number]) [id number] Page 2 of 8 -------------------------------------------------------------------------------- Encounter Discharge Information Details Patient Name: Date of Service: [name] 02/04/2022 10:30 A M Medical Record Number: [id number] Patient Account Number: [id number] Date of Birth/Sex: Treating RN: Jun 05, 1945 (77 y.o. [name] Primary Care Abdimalik Mayorquin: Domingo Mend Other Clinician: Referring Hamed Debella: Treating Foye Damron/Extender: Gevena Cotton Weeks in Treatment: 19 Encounter Discharge Information Items Post Procedure Vitals Discharge Condition: Stable Temperature (F): 97.7 Ambulatory Status: Ambulatory Pulse (bpm): 84 Discharge Destination: Home Respiratory Rate (breaths/min): 18 Transportation: Private Auto Blood Pressure (mmHg): 119/65 Accompanied By: self Schedule Follow-up Appointment: Yes Clinical Summary of Care: Patient Declined Electronic Signature(s) Signed: 02/04/2022 5:57:45 PM By: Baruch Gouty RN, BSN Entered By: Baruch Gouty on 02/04/2022 11:15:23 -------------------------------------------------------------------------------- Lower Extremity Assessment Details Patient Name: Date of Service: April Austin [name]. 02/04/2022 10:30 A M Medical Record Number: [id number] Patient Account Number: [id number] Date of Birth/Sex: Treating RN: 1945/10/24 (77 y.o. [name] Primary Care Bingham Millette: Domingo Mend Other Clinician: Referring Monia Timmers: Treating Xochilth Standish/Extender: Gevena Cotton Weeks in Treatment: 19 Edema Assessment Assessed: [Left: No] [Right: No] Edema: [Left: N] [Right: o] Calf Left: Right: Point of  Measurement: From Medial Instep 26.5 cm Ankle Left: Right: Point of Measurement: From Medial Instep 17.8 cm Vascular Assessment Pulses: Dorsalis Pedis Palpable: [Right:Yes] Electronic Signature(s) Signed: 02/04/2022 4:55:35 PM By: Blanche East RN Entered By: Blanche East on 02/04/2022 10:42:15 Multi Wound Chart Details -------------------------------------------------------------------------------- Haskell Flirt [name] ([id number]) [id number] Page 3 of 8 Patient Name: Date of Service: [name] 02/04/2022 10:30 A M Medical Record Number: [id number] Patient Account Number: [id number] Date of Birth/Sex: Treating RN: 1945/05/30 (77 y.o. F) Primary Care Webber Michiels: Domingo Mend Other Clinician: Referring Willard Farquharson: Treating Cavin Longman/Extender: [name]  Montel Clock , Estela Weeks in Treatment: 19 Vital Signs Height(in): 60 Pulse(bpm): 2 Weight(lbs): 101 Blood Pressure(mmHg): 119/65 Body Mass Index(BMI): 19.7 Temperature(F): 97.7 Respiratory Rate(breaths/min): 16 Wound Assessments Wound Number: 2 N/A N/A Photos: N/A N/A Right, Lateral Lower Leg N/A N/A Wound Location: Surgical Injury N/A N/A Wounding Event: Open Surgical Wound N/A N/A Primary Etiology: Venous Leg Ulcer N/A N/A Secondary Etiology: Cataracts, Chronic sinus N/A N/A Comorbid History: problems/congestion, Hypertension, Crohns, Osteoarthritis 08/17/2021 N/A N/A Date Acquired: 73 N/A N/A Weeks of Treatment: Open N/A N/A Wound Status: No N/A N/A Wound Recurrence: Yes N/A N/A Clustered Wound: 1.5x1x0.1 N/A N/A Measurements L x W x D (cm) 1.178 N/A N/A A (cm) : rea 0.118 N/A N/A Volume (cm) : 93.50% N/A N/A % Reduction in A rea: 93.50% N/A N/A % Reduction in Volume: Full Thickness Without Exposed N/A N/A Classification: Support Structures Medium N/A N/A Exudate A mount: Serosanguineous N/A N/A Exudate Type: red, brown N/A N/A Exudate Color: Flat and  Intact N/A N/A Wound Margin: Large (67-100%) N/A N/A Granulation A mount: Red, Hyper-granulation N/A N/A Granulation Quality: Small (1-33%) N/A N/A Necrotic A mount: Fat Layer (Subcutaneous Tissue): Yes N/A N/A Exposed Structures: Fascia: No Tendon: No Muscle: No Joint: No Bone: No Small (1-33%) N/A N/A Epithelialization: Debridement - Selective/Open Wound N/A N/A Debridement: Pre-procedure Verification/Time Out 10:45 N/A N/A Taken: Lidocaine 4% Topical Solution N/A N/A Pain Control: Slough N/A N/A Tissue Debrided: Non-Viable Tissue N/A N/A Level: 0.09 N/A N/A Debridement A (sq cm): rea Curette N/A N/A Instrument: Minimum N/A N/A Bleeding: Pressure N/A N/A Hemostasis A chieved: 0 N/A N/A Procedural Pain: 0 N/A N/A Post Procedural Pain: Procedure was tolerated well N/A N/A Debridement Treatment Response: 1.5x1x0.1 N/A N/A Post Debridement Measurements L x W x D (cm) 0.118 N/A N/A Post Debridement Volume: (cm) Excoriation: No N/A N/A Periwound Skin Texture: Induration: No Callus: No Crepitus: No Rash: No Scarring: No Dry/Scaly: No N/A N/A Periwound Skin Moisture: Atrophie Blanche: No N/A N/A Periwound Skin ColorKEOSHA, ROSSA [name] ([id number]) [id number] Page 4 of 8 Cyanosis: No Ecchymosis: No Erythema: No Hemosiderin Staining: No Mottled: No Pallor: No Rubor: No No Abnormality N/A N/A Temperature: Compression Therapy N/A N/A Procedures Performed: Debridement Treatment Notes Electronic Signature(s) Signed: 02/04/2022 10:56:10 AM By: [name] MD FACS Entered By: [name] on 02/04/2022 10:56:10 -------------------------------------------------------------------------------- Multi-Disciplinary Care Plan Details Patient Name: Date of Service: April Austin [name]. 02/04/2022 10:30 A M Medical Record Number: [id number] Patient Account Number: [id number] Date of Birth/Sex: Treating RN: April 01, 1945 (76 y.o. [name] Primary Care Tavyn Kurka: Domingo Mend Other Clinician: Referring Elizabeht Suto: Treating Dezeray Puccio/Extender: Caprice Kluver in Treatment: 19 Multidisciplinary Care Plan reviewed with physician Active Inactive Abuse / Safety / Falls / Self Care Management Nursing Diagnoses: History of Falls Potential for falls Goals: Patient will not experience any injury related to falls Date Initiated: 12/07/2021 Target Resolution Date: 02/25/2022 Goal Status: Active Patient/caregiver will verbalize/demonstrate measures taken to prevent injury and/or falls Date Initiated: 09/20/2021 Date Inactivated: 10/19/2021 Target Resolution Date: 10/18/2021 Goal Status: Met Interventions: Assess fall risk on admission and as needed Assess impairment of mobility on admission and as needed per policy Notes: Pressure Nursing Diagnoses: Knowledge deficit related to causes and risk factors for pressure ulcer development Goals: Patient will remain free of pressure ulcers Date Initiated: 12/07/2021 Target Resolution Date: 02/25/2022 Goal Status: Active Interventions: Assess potential for pressure ulcer upon admission and as needed Notes: Electronic Signature(s) Signed: 02/04/2022 4:55:35 PM  By: Blanche East RN [place], [place] 02/04/2022 4:55:35 PM By: Blanche East RN Signed: I ([id number]) [id number] Page 5 of 8 Entered By: Blanche East on 02/04/2022 10:44:19 -------------------------------------------------------------------------------- Pain Assessment Details Patient Name: Date of Service: JAYDIN, JALOMO 02/04/2022 10:30 A M Medical Record Number: [id number] Patient Account Number: [id number] Date of Birth/Sex: Treating RN: August 13, 1945 (77 y.o. [name] Primary Care Tresha Muzio: Domingo Mend Other Clinician: Referring Danni Leabo: Treating Dartanion Teo/Extender: Gevena Cotton Weeks in Treatment: 19 Active  Problems Location of Pain Severity and Description of Pain Patient Has Paino No Site Locations Rate the pain. Current Pain Level: 0 Pain Management and Medication Current Pain Management: Electronic Signature(s) Signed: 02/04/2022 4:55:35 PM By: Blanche East RN Entered By: Blanche East on 02/04/2022 10:35:35 -------------------------------------------------------------------------------- Patient/Caregiver Education Details Patient Name: Date of Service: April Austin [name]. 1/29/2024andnbsp10:30 [place] Record Number: [id number] Patient Account Number: [id number] Date of Birth/Gender: Treating RN: 1945-03-12 (77 y.o. [name] Primary Care Physician: Domingo Mend Other Clinician: Referring Physician: Treating Physician/Extender: Caprice Kluver in Treatment: 32 Education Assessment Education Provided To: Patient Education Topics Provided SHANEQUA, WHITENIGHT [name] ([id number]) [id number] Page 6 of 8 Venous: Methods: Explain/Verbal Responses: Reinforcements needed, State content correctly Electronic Signature(s) Signed: 02/04/2022 4:55:35 PM By: Blanche East RN Entered By: Blanche East on 02/04/2022 10:44:40 -------------------------------------------------------------------------------- Wound Assessment Details Patient Name: Date of Service: April Austin [name]. 02/04/2022 10:30 A M Medical Record Number: [id number] Patient Account Number: [id number] Date of Birth/Sex: Treating RN: 1945/12/22 (77 y.o. Iver Nestle, Jamie Primary Care Angell Honse: Domingo Mend Other Clinician: Referring Valencia Kassa: Treating Phoenicia Pirie/Extender: Gevena Cotton Weeks in Treatment: 19 Wound Status Wound Number: 2 Primary Open Surgical Wound Etiology: Wound Location: Right, Lateral Lower Leg Secondary Venous Leg Ulcer Wounding Event: Surgical Injury Etiology: Date Acquired: 08/17/2021 Wound Status: Open Weeks Of  Treatment: 19 Comorbid Cataracts, Chronic sinus problems/congestion, Hypertension, Clustered Wound: Yes History: Crohns, Osteoarthritis Photos Wound Measurements Length: (cm) 1.5 Width: (cm) 1 Depth: (cm) 0.1 Area: (cm) 1.178 Volume: (cm) 0.118 % Reduction in Area: 93.5% % Reduction in Volume: 93.5% Epithelialization: Small (1-33%) Tunneling: No Undermining: No Wound Description Classification: Full Thickness Without Exposed Suppor Wound Margin: Flat and Intact Exudate Amount: Medium Exudate Type: Serosanguineous Exudate Color: red, brown t Structures Foul Odor After Cleansing: No Slough/Fibrino Yes Wound Bed Granulation Amount: Large (67-100%) Exposed Structure Granulation Quality: Red, Hyper-granulation Fascia Exposed: No Necrotic Amount: Small (1-33%) Fat Layer (Subcutaneous Tissue) Exposed: Yes Necrotic Quality: Adherent Slough Tendon Exposed: No Muscle Exposed: No Joint Exposed: No Bone Exposed: No Periwound Skin Texture AKILI, CUDA [name] ([id number]) [phone].pdf Page 7 of 8 Texture Color No Abnormalities Noted: Yes No Abnormalities Noted: Yes Moisture Temperature / Pain No Abnormalities Noted: Yes Temperature: No Abnormality Treatment Notes Wound #2 (Lower Leg) Wound Laterality: Right, Lateral Cleanser Soap and Water Discharge Instruction: May shower and wash wound with dial antibacterial soap and water prior to dressing change. Wound Cleanser Discharge Instruction: Cleanse the wound with wound cleanser prior to applying a clean dressing using gauze sponges, not tissue or cotton balls. Peri-Wound Care Sween Lotion (Moisturizing lotion) Discharge Instruction: Apply moisturizing lotion as directed Topical Mupirocin Ointment Discharge Instruction: Apply Mupirocin (Bactroban) as instructed Primary Dressing Hydrofera Blue Ready Transfer Foam, 2.5x2.5 (in/in) Discharge Instruction: Apply directly to wound bed as directed Secondary  Dressing Woven Gauze Sponge, Non-Sterile 4x4 in Discharge Instruction: Apply over primary dressing as directed. Secured With Compression Wrap ThreePress (  3 layer compression wrap) Discharge Instruction: Apply three layer compression as directed. Compression Stockings Add-Ons Electronic Signature(s) Signed: 02/04/2022 4:55:35 PM By: Blanche East RN Entered By: Blanche East on 02/04/2022 10:43:51 -------------------------------------------------------------------------------- Vitals Details Patient Name: Date of Service: April Austin [name]. 02/04/2022 10:30 A M Medical Record Number: [id number] Patient Account Number: [id number] Date of Birth/Sex: Treating RN: Feb 18, 1945 (77 y.o. Iver Nestle, Jamie Primary Care Lashondra Vaquerano: Domingo Mend Other Clinician: Referring Lillar Bianca: Treating Jackie Russman/Extender: Gevena Cotton Weeks in Treatment: 19 Vital Signs Time Taken: 10:30 Temperature (F): 97.7 Height (in): 60 Pulse (bpm): 84 Weight (lbs): 101 Respiratory Rate (breaths/min): 16 Body Mass Index (BMI): 19.7 Blood Pressure (mmHg): 119/65 Reference Range: 80 - 120 mg / dl Electronic Signature(s) Signed: 02/04/2022 4:55:35 PM By: Blanche East RN Entered By: Blanche East on 02/04/2022 10:35:25 [place], Parks Ranger ([id number]) [id number] Page 8 of 8

## 2022-02-04 NOTE — Progress Notes (Signed)
[name], [name] ([id number]) [id number] Page 1 of 11 Visit Report for 02/04/2022 Chief Complaint Document Details Patient Name: Date of Service: [name], [name] 02/04/2022 10:30 A M Medical Record Number: [id number] Patient Account Number: [id number] Date of Birth/Sex: Treating RN: 08/25/1945 (77 y.o. F) Primary Care Provider: Domingo Mend Other Clinician: Referring Provider: Treating Provider/Extender: Caprice Kluver in Treatment: 19 Information Obtained from: Patient Chief Complaint 11/15/2020; patient is here for review of a wound area on the right posterior calf 09/20/2021: Patient is here for a nonhealing wound after undergoing Mohs surgery on her right lower leg. 12/07/2021: Reopening of skin at site of previous wound, new wound on right toe, concerning lesion on left upper leg just above knee Electronic Signature(s) Signed: 02/04/2022 10:56:50 AM By: [name] MD FACS Entered By: [name] on 02/04/2022 10:56:50 -------------------------------------------------------------------------------- Debridement Details Patient Name: Date of Service: [name]. 02/04/2022 10:30 A M Medical Record Number: [id number] Patient Account Number: [id number] Date of Birth/Sex: Treating RN: 1945/05/25 (77 y.o. [name], [name] Primary Care Provider: Domingo Mend Other Clinician: Referring Provider: Treating Provider/Extender: Gevena Cotton Weeks in Treatment: 19 Debridement Performed for Assessment: Wound #2 Right,Lateral Lower Leg Performed By: Physician [name], MD Debridement Type: Debridement Severity of Tissue Pre Debridement: Fat layer exposed Level of Consciousness (Pre-procedure): Awake and Alert Pre-procedure Verification/Time Out Yes - 10:45 Taken: Start Time: 10:48 Pain Control: Lidocaine 4% T opical Solution T Area Debrided (L x W): otal 0.3 (cm) x 0.3 (cm) =  0.09 (cm) Tissue and other material debrided: Non-Viable, Slough, Slough Level: Non-Viable Tissue Debridement Description: Selective/Open Wound Instrument: Curette Bleeding: Minimum Hemostasis Achieved: Pressure Procedural Pain: 0 Post Procedural Pain: 0 Response to Treatment: Procedure was tolerated well Level of Consciousness (Post- Awake and Alert procedure): Post Debridement Measurements of Total Wound Length: (cm) 1.5 Width: (cm) 1 Depth: (cm) 0.1 Volume: (cm) 0.118 Seal, [name] ([id number]) [id number] Page 2 of 11 Character of Wound/Ulcer Post Debridement: Improved Severity of Tissue Post Debridement: Fat layer exposed Post Procedure Diagnosis Same as Pre-procedure Notes Scribed for Dr. [name] by Baruch Gouty RN Electronic Signature(s) Signed: 02/04/2022 12:30:26 PM By: [name] MD FACS Signed: 02/04/2022 5:57:45 PM By: Baruch Gouty RN, BSN Entered By: Baruch Gouty on 02/04/2022 10:51:57 -------------------------------------------------------------------------------- HPI Details Patient Name: Date of Service: [name]. 02/04/2022 10:30 A M Medical Record Number: [id number] Patient Account Number: [id number] Date of Birth/Sex: Treating RN: [birth year] (77 y.o. F) Primary Care Provider: Domingo Mend Other Clinician: Referring Provider: Treating Provider/Extender: Gevena Cotton Weeks in Treatment: 19 History of Present Illness HPI Description: ADMISSION 11/15/2020 This is a 77 year old woman with a history of squamous cell cancer skin cancer followed by Dr. Anabel Bene dermatology. She describes having a wound on the right posterior calf dating back since September. She saw her primary doctor in early October who suggested petroleum infused gauze. The patient has been doing this. No real improvement in fact she says this is getting larger. She does not complain much of pain. She is completely  uncertain about how this happened there was no trauma she simply became aware of it at some point describing it is about the size of a quarter but it has been getting progressively larger since then. She is here for review of this. Past medical history Crohn's disease not currently on prednisone, gastroesophageal reflux disease, history of squamous cell skin cancer, history of rheumatoid arthritis,  hypertension 11/16; this lady had a large superficial atypical wound on the right posterior calf. She has significant solar skin damage probably some degree of venous insufficiency but she described a rapidly expanding wound area. Because of this and a history of skin cancer [name] went ahead and did a shave biopsy but we do not have the results of this at the time we saw her in clinic today. She has been using silver alginate and kerlix 11/30; shave biopsy [name] did last time did not show any malignancy or fungal elements. Suggestion of a stasis ulcer. With our treatment which is simply been silver alginate with kerlix that she is changing daily the wound is down dramatically almost surprising how rapidly it is reduced in surface area 12/14; patient comes in today with her wound healed this was on the right posterior calf. She has significant solar skin damage probably some degree of venous insufficiency however she described a rapidly expanding wound area on admission. Because of this and a history of skin cancer [name] did do a shave biopsy but nothing was really found of worry. 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 Unfortunately, the wound has failed to heal. There is thick slough on the wound surface. The periwound skin is in reasonably good condition. There  is granulation tissue forming underneath the slough. 09/28/2021: The wound has deteriorated rather significantly over the past week. She had fairly copious drainage that she noticed as early as Monday, but she did not contact our office. Today there is a lot of periwound skin breakdown and a very pungent odor coming from the wound. There is thick slough on the wound surface but there is still good granulation tissue underlying this. Despite this, however, the wound actually measures smaller. 10/05/2021: The culture that [name] took grew out MRSA. [name] prescribed doxycycline and she is still taking this. There has been a tremendous improvement in the wound. It is smaller and has contracted considerably. There is hypertrophic granulation tissue present. No malodorous drainage. 10/16/2021: The wound has contracted further and is epithelializing. The surface is very clean and the hypertrophic granulation tissue has not recurred. She completed her course of doxycycline. 10/19; surgical wound on the right medial lower extremity secondary to Mohs surgery done in August. She is using Hydrofera Blue under 3 layer compression and making excellent progress. 11/02/2021: The wound is nearly completely epithelialized. Just a couple of open areas with some eschar overlying the surface. 11/12/2021: Just 1 small open area remains. It is clean without any slough or eschar accumulation. No hypertrophic granulation tissue present. 11/19/2021: Her wound is healed. READMISSION 12/07/2021 BARBI, KUMAGAI [name] ([id number]) [id number] Page 3 of 11 The patient has been moving for the past couple of weeks and has spent a substantial amount of time on her feet. As result, she developed leg swelling and the tissue where her wound had been, which she was protecting with a Band-Aid, broke down considerably. She now has a large wound that is irregular with extensive slough, involving the fat layer. She also developed a  blister on her toe that has slough and eschar present. She also pointed out lump just above her left knee that she is concerned about, as she does have a history of multiple skin  cancers. 12/11; patient comes in the clinic with a large wound on the right anterior lower leg. This was initially a skin cancer underwent Mohs surgery and was treated in this clinic and healed out on 11/13. The patient states the wound reopened with edema and also trauma from a dressing she was putting on it. 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 She also has a wound on the right second toe. The patient states quite adamantly she will not go back to her previous dermatologist for the skin surgery center in [place] blaming them for the original wound on her right lateral leg. [name] am not exactly sure of her rationale but at this point she certainly will not consider a referral there 12/18; the patient has an appointment at [facility] on 12/28 although [name] am not sure exactly who she is seeing. This is in reference to the skin cancer just above her left patella. She would not go back to the skin surgery or indeed her dermatologist in [place]. She had an area on the right second toe this is healed today. Her original surgical site on the right anterior lateral lower leg actually looks some better. [name] changed her to Iodoflex last week 01/01/2022: The patient has an appointment with plastic surgery on Thursday to evaluate the biopsy-proven squamous cell carcinoma on her left upper leg. The wound on her right lower leg has a thick layer of slough on the surface and has a very atypical appearance. 01/08/2022: The biopsies that [name] took last week from the wound on her right lower extremity were negative for malignancy but did demonstrate some bacteria along with inflammation. She met with the plastic surgeon on Thursday and she is going to undergo wide local excision of the biopsy-proven  squamous cell carcinoma on her left upper leg. The right lower leg wound is narrower again this week. There is slough and eschar as well as some fibrinous debris in the areas where [name] took the biopsies and obtained hemostasis with silver nitrate. 01/14/2022: The wound is narrower today, but is beginning to accumulate hypertrophic granulation tissue. Thin layer of slough on the surface. There is still slough and fibrinous debris in the 2 biopsy sites. 01/21/2022: The wound continues to contract. There is still slough and nonviable subcutaneous tissue in both biopsy sites. 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 01/28/2022: About half the wound has epithelialized. The remaining open portion has hypertrophic granulation tissue present. 02/04/2022: The wound is smaller by about a centimeter. There is some slough accumulation in the old biopsy site. The rest of the wound is clean. Electronic Signature(s) Signed: 02/04/2022 10:57:19 AM By: [name] MD FACS Entered By: [name] on 02/04/2022 10:57:19 -------------------------------------------------------------------------------- Physical Exam Details Patient Name: Date of Service: [name]. 02/04/2022 10:30 A M Medical Record Number: [id number] Patient Account Number: [id number] Date of Birth/Sex: Treating RN: 01/23/1945 (77 y.o. F) Primary Care Provider: Domingo Mend Other Clinician: Referring Provider: Treating Provider/Extender: Kerry [place] , Olam Idler Weeks in Treatment: 19 Constitutional . . . . no acute distress. Respiratory Normal work of breathing on room air. Notes 02/04/2022: The wound is smaller by about a centimeter. There is some slough accumulation in the old biopsy site. The rest of the wound is clean. Electronic Signature(s) Signed:  02/04/2022 10:58:04 AM By: [name] MD FACS Entered By: [name] on 02/04/2022 10:58:04 [name] ([id number]) [id number] Page 4 of 11 -------------------------------------------------------------------------------- Physician Orders Details Patient Name: Date of Service: [name], [name] 02/04/2022 10:30 A M Medical Record Number: [id number] Patient Account Number: [id number] Date of Birth/Sex: Treating RN: January 04, 1946 (77 y.o. [name], [name] Primary Care Provider: Domingo Mend Other Clinician: Referring Provider: Treating Provider/Extender: Caprice Kluver in Treatment: 26 Verbal / Phone Orders: No Diagnosis Coding ICD-10 Coding Code Description [phone] 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d T81.31XS Disruption of external operation (surgical) wound, not elsewhere classified, sequela L98.9 Disorder of the skin and subcutaneous tissue, unspecified C44.722 Squamous cell carcinoma of skin of right lower limb, including hip I10 Essential (primary) hypertension K50.90 Crohn's disease, unspecified, without complications D62 Unspecified severe protein-calorie malnutrition Follow-up Appointments ppointment in 1 week. - Dr. [name] Rm 1 Return A Monday 2/5 @ 1030 am Anesthetic (In clinic) Topical Lidocaine 4% applied to wound bed Bathing/ Shower/ Hygiene May shower with protection but do not get wound dressing(s) wet. Protect dressing(s) with water repellant cover (for example, large plastic bag) or a cast cover and may then take shower. Edema Control - Lymphedema / SCD / Other Avoid standing for long periods of time. Moisturize legs daily. Wound Treatment Wound #2 - Lower Leg Wound Laterality: Right, Lateral Cleanser: Soap and Water 1 x Per Week/30 Days Discharge Instructions: May shower and wash wound with dial antibacterial soap and water prior to dressing  change. Cleanser: Wound Cleanser 1 x Per Week/30 Days Discharge Instructions: Cleanse the wound with wound cleanser prior to applying a clean dressing using gauze sponges, not tissue or cotton balls. Peri-Wound Care: Sween Lotion (Moisturizing lotion) 1 x Per Week/30 Days Discharge Instructions: Apply moisturizing lotion as directed Topical: Mupirocin Ointment 1 x Per Week/30 Days Discharge Instructions: Apply Mupirocin (Bactroban) as instructed Prim Dressing: Hydrofera Blue Ready Transfer Foam, 2.5x2.5 (in/in) 1 x Per Week/30 Days ary Discharge Instructions: Apply directly to wound bed as directed Secondary Dressing: Woven Gauze Sponge, Non-Sterile 4x4 in 1 x Per Week/30 Days Discharge Instructions: Apply over primary dressing as directed. Compression Wrap: ThreePress (3 layer compression wrap) 1 x Per Week/30 Days Discharge Instructions: Apply three layer compression as directed. Electronic Signature(s) Signed: 02/04/2022 12:30:26 PM By: [name] MD FACS Entered By: [name] on 02/04/2022 10:58:17 Micco, Parks Ranger ([id number]) [id number] Page 5 of 11 -------------------------------------------------------------------------------- Problem List Details Patient Name: Date of Service: DALIYA, PARCHMENT 02/04/2022 10:30 A M Medical Record Number: [id number] Patient Account Number: [id number] Date of Birth/Sex: Treating RN: 03-Sep-1945 (77 y.o. [name] Primary Care Provider: Domingo Mend Other Clinician: Referring Provider: Treating Provider/Extender: Gevena Cotton Weeks in Treatment: 19 Active Problems ICD-10 Encounter Code Description Active Date MDM Diagnosis L97.812 Non-pressure chronic ulcer of other part of right lower leg with fat layer 09/20/2021 No Yes exposed T81.31XS Disruption of external operation (surgical) wound, not elsewhere classified, 09/20/2021 No Yes sequela L98.9 Disorder of the skin and  subcutaneous tissue, unspecified 12/07/2021 No Yes C44.722 Squamous cell carcinoma of skin of right lower limb, including hip 09/20/2021 No Yes I10 Essential (primary) hypertension 09/20/2021 No Yes K50.90 Crohn's disease, unspecified, without complications 4/48/1856 No Yes E43 Unspecified severe protein-calorie malnutrition 09/20/2021 No Yes Inactive Problems ICD-10 Code Description Active Date Inactive Date L97.512 Non-pressure chronic ulcer of other part of right foot with  fat layer exposed 12/07/2021 12/07/2021 Resolved Problems Electronic Signature(s) Signed: 02/04/2022 10:56:04 AM By: [name] MD FACS Entered By: [name] on 02/04/2022 10:56:04 -------------------------------------------------------------------------------- Progress Note Details Patient Name: Date of Service: [name]. 02/04/2022 10:30 A M Medical Record Number: [id number] Patient Account Number: [id number] Date of Birth/Sex: Treating RN: 1945/08/22 (77 y.o. F) Primary Care Provider: Domingo Mend Other Clinician: Referring Provider: Treating Provider/Extender: Dimple, Bastyr [name] ([id number]) [id number] Page 6 of 11 Weeks in Treatment: 19 Subjective Chief Complaint Information obtained from Patient 11/15/2020; patient is here for review of a wound area on the right posterior calf 09/20/2021: Patient is here for a nonhealing wound after undergoing Mohs surgery on her right lower leg. 12/07/2021: Reopening of skin at site of previous wound, new wound on right toe, concerning lesion on left upper leg just above knee History of Present Illness (HPI) ADMISSION 11/15/2020 This is a 77 year old woman with a history of squamous cell cancer skin cancer followed by Dr. Anabel Bene dermatology. She describes having a wound on the right posterior calf dating back since September. She saw her primary doctor in early October who suggested petroleum  infused gauze. The patient has been doing this. No real improvement in fact she says this is getting larger. She does not complain much of pain. She is completely uncertain about how this happened there was no trauma she simply became aware of it at some point describing it is about the size of a quarter but it has been getting progressively larger since then. She is here for review of this. Past medical history Crohn's disease not currently on prednisone, gastroesophageal reflux disease, history of squamous cell skin cancer, history of rheumatoid arthritis, hypertension 11/16; this lady had a large superficial atypical wound on the right posterior calf. She has significant solar skin damage probably some degree of venous insufficiency but she described a rapidly expanding wound area. Because of this and a history of skin cancer [name] went ahead and did a shave biopsy but we do not have the results of this at the time we saw her in clinic today. She has been using silver alginate and kerlix 11/30; shave biopsy [name] did last time did not show any malignancy or fungal elements. Suggestion of a stasis ulcer. With our treatment which is simply been silver alginate with kerlix that she is changing daily the wound is down dramatically almost surprising how rapidly it is reduced in surface area 12/14; patient comes in today with her wound healed this was on the right posterior calf. She has significant solar skin damage probably some degree of venous insufficiency however she described a rapidly expanding wound area on admission. Because of this and a history of skin cancer [name] did do a shave biopsy but nothing was really found of worry. 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 Unfortunately, the wound has failed to heal. There is thick slough on the wound surface. The periwound skin is in reasonably good condition. There is granulation tissue forming underneath the slough. 09/28/2021: The wound has deteriorated rather significantly over the past week. She had fairly copious drainage that she noticed as early as Monday, but she did not contact our office. Today there  is a lot of periwound skin breakdown and a very pungent odor coming from the wound. There is thick slough on the wound surface but there is still good granulation tissue underlying this. Despite this, however, the wound actually measures smaller. 10/05/2021: The culture that [name] took grew out MRSA. [name] prescribed doxycycline and she is still taking this. There has been a tremendous improvement in the wound. It is smaller and has contracted considerably. There is hypertrophic granulation tissue present. No malodorous drainage. 10/16/2021: The wound has contracted further and is epithelializing. The surface is very clean and the hypertrophic granulation tissue has not recurred. She completed her course of doxycycline. 10/19; surgical wound on the right medial lower extremity secondary to Mohs surgery done in August. She is using Hydrofera Blue under 3 layer compression and making excellent progress. 11/02/2021: The wound is nearly completely epithelialized. Just a couple of open areas with some eschar overlying the surface. 11/12/2021: Just 1 small open area remains. It is clean without any slough or eschar accumulation. No hypertrophic granulation tissue present. 11/19/2021: Her wound is healed. READMISSION 12/07/2021 The patient has been moving for the past couple of weeks and has spent a substantial amount of time on her feet. As result, she developed leg swelling and the tissue where her wound had been, which she was protecting with a Band-Aid, broke down considerably. She now has a large wound that  is irregular with extensive slough, involving the fat layer. She also developed a blister on her toe that has slough and eschar present. She also pointed out lump just above her left knee that she is concerned about, as she does have a history of multiple skin cancers. 12/11; patient comes in the clinic with a large wound on the right anterior lower leg. This was initially a skin cancer underwent Mohs surgery and was treated in this clinic and healed out on 11/13. The patient states the wound reopened with edema and also trauma from a dressing she was putting on it. 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 She also has a wound on the right second toe. The patient states quite adamantly she will not go back to her previous dermatologist for the skin surgery center in [place] blaming them for the original wound on her right lateral leg. [name] am not exactly sure of her rationale but at this point she certainly will not consider a referral there 12/18; the patient has an appointment at [facility] on 12/28 although [name] am not sure exactly who she is seeing. This is in reference to the skin cancer just above her left patella. She would not go back to the skin surgery or indeed her dermatologist in [place]. She had an area on the right second toe this is healed today. Her original surgical site on the right anterior lateral lower leg actually looks some better. [name] changed her to Iodoflex last week 01/01/2022: The patient has an appointment with plastic surgery on Thursday to evaluate the biopsy-proven squamous cell carcinoma on her left upper leg. The wound on her right lower leg has a thick layer of slough on the surface and has a very atypical appearance. 01/08/2022: The biopsies that [name] took last week from the wound on her right lower extremity were negative for malignancy but did demonstrate some bacteria along with inflammation. She met with the plastic surgeon on  Thursday and she is going to undergo wide local excision of the biopsy-proven squamous cell carcinoma  on her left upper leg. The right lower leg wound is narrower again this week. There is slough and eschar as well as some fibrinous debris in the areas where [name] took the biopsies and obtained hemostasis with silver nitrate. 01/14/2022: The wound is narrower today, but is beginning to accumulate hypertrophic granulation tissue. Thin layer of slough on the surface. There is still slough and fibrinous debris in the 2 biopsy sites. [name], [name] ([id number]) [id number] Page 7 of 11 01/21/2022: The wound continues to contract. There is still slough and nonviable subcutaneous tissue in both biopsy sites. 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 01/28/2022: About half the wound has epithelialized. The remaining open portion has hypertrophic granulation tissue present. 02/04/2022: The wound is smaller by about a centimeter. There is some slough accumulation in the old biopsy site. The rest of the wound is clean. Patient History Information obtained from Patient. Family History Diabetes - Father, Heart Disease - Father, Hypertension - Father, No family history of Cancer, Hereditary Spherocytosis, Kidney Disease, Lung Disease, Seizures, Stroke, Thyroid Problems, Tuberculosis. Social History Former smoker - quit 30 years ago, Marital Status - Married, Alcohol Use - Never, Drug Use - No History, Caffeine Use - Rarely. Medical History Eyes Patient has history of Cataracts - Had cataract surgery Denies history of Glaucoma, Optic Neuritis Ear/Nose/Mouth/Throat Patient has history of Chronic sinus problems/congestion - Post Nasal drip Denies history of Middle ear problems Hematologic/Lymphatic Denies history of Anemia, Hemophilia,  Human Immunodeficiency Virus, Lymphedema, Sickle Cell Disease Respiratory Denies history of Aspiration, Asthma, Chronic Obstructive Pulmonary Disease (COPD), Pneumothorax, Sleep Apnea, Tuberculosis Cardiovascular Patient has history of Hypertension Gastrointestinal Patient has history of Crohnoos - Has Chrons Disease Denies history of Cirrhosis , Colitis, Hepatitis A, Hepatitis B, Hepatitis C Endocrine Denies history of Type [name] Diabetes, Type II Diabetes Genitourinary Denies history of End Stage Renal Disease Immunological Denies history of Lupus Erythematosus, Raynaudoos, Scleroderma Musculoskeletal Patient has history of Osteoarthritis - Knees Denies history of Gout, Rheumatoid Arthritis, Osteomyelitis Neurologic Denies history of Dementia, Neuropathy, Quadriplegia, Paraplegia, Seizure Disorder Oncologic Denies history of Received Chemotherapy, Received Radiation Psychiatric Denies history of Anorexia/bulimia, Confinement Anxiety Hospitalization/Surgery History - bil TKA. - Moh's procedure right leg 08/17/21. - bil cataract extractions. - nasal fx surgery. Medical A Surgical History Notes nd Gastrointestinal Has a large hiatal hernia. Has Chrons Disease., GERD, pancreatic insufficiency Endocrine Pancreatic Insufficiency Genitourinary stage 3 Chronic Kidney disease Integumentary (Skin) Had Squamous Cell (skin cancer). Had a skin graft on Right knee (area) Musculoskeletal Had Bil knee replacements, hx cervical spine fx, sternal fx, thoracis spine fx Neurologic left foot drop Oncologic Had Squamous Cell (skin cell) Objective Constitutional no acute distress. Vitals Time Taken: 10:30 AM, Height: 60 in, Weight: 101 lbs, BMI: 19.7, Temperature: 97.7 F, Pulse: 84 bpm, Respiratory Rate: 16 breaths/min, Blood Pressure: 119/65 mmHg. Respiratory Normal work of breathing on room air. RINIYAH, SPEICH [name] ([id number]) [id number] Page 8 of 11 General  Notes: 02/04/2022: The wound is smaller by about a centimeter. There is some slough accumulation in the old biopsy site. The rest of the wound is clean. Integumentary (Hair, Skin) Wound #2 status is Open. Original cause of wound was Surgical Injury. The date acquired was: 08/17/2021. The wound has been in treatment 19 weeks. The wound is located on the Right,Lateral Lower Leg. The wound measures 1.5cm  length x 1cm width x 0.1cm depth; 1.178cm^2 area and 0.118cm^3 volume. There is Fat Layer (Subcutaneous Tissue) exposed. There is no tunneling or undermining noted. There is a medium amount of serosanguineous drainage noted. The wound margin is flat and intact. There is large (67-100%) red, hyper - granulation within the wound bed. There is a small (1-33%) amount of necrotic tissue within the wound bed including Adherent Slough. The periwound skin appearance had no abnormalities noted for texture. The periwound skin appearance had no abnormalities noted for moisture. The periwound skin appearance had no abnormalities noted for color. Periwound temperature was noted as No Abnormality. 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 There was a Selective/Open Wound Non-Viable Tissue Debridement with a total area of 0.09 sq cm performed by [name], MD. With the following instrument(s): Curette to remove  Non-Viable tissue/material. Material removed includes [facility] after achieving pain control using Lidocaine 4% Topical Solution. No specimens were taken. A time out was conducted at 10:45, prior to the start of the procedure. A Minimum amount of bleeding was controlled with Pressure. The procedure was tolerated well with a pain level of 0 throughout and a pain level of 0 following the procedure. Post Debridement Measurements: 1.5cm length x 1cm width x 0.1cm depth; 0.118cm^3 volume. Character of Wound/Ulcer Post Debridement is improved. Severity of Tissue Post Debridement is: Fat layer exposed. Post procedure Diagnosis Wound #2: Same as Pre-Procedure General Notes: Scribed for Dr. [name] by Baruch Gouty RN. Pre-procedure diagnosis of Wound #2 is an Open Surgical Wound located on the Right,Lateral Lower Leg . There was a Three Layer Compression Therapy Procedure by Baruch Gouty, RN. Post procedure Diagnosis Wound #2: Same as Pre-Procedure Plan Follow-up Appointments: Return Appointment in 1 week. - Dr. [name] Rm 1 Monday 2/5 @ 1030 am Anesthetic: (In clinic) Topical Lidocaine 4% applied to wound bed Bathing/ Shower/ Hygiene: May shower with protection but do not get wound dressing(s) wet. Protect dressing(s) with water repellant cover (for example, large plastic bag) or a cast cover and may then take shower. Edema Control - Lymphedema / SCD / Other: Avoid standing for long periods of time. Moisturize legs daily. WOUND #2: - Lower Leg Wound Laterality: Right, Lateral Cleanser: Soap and Water 1 x Per Week/30 Days Discharge Instructions: May shower and wash wound with dial antibacterial soap and water prior to dressing change. Cleanser: Wound Cleanser 1 x Per Week/30 Days Discharge Instructions: Cleanse the wound with wound cleanser prior to applying a clean dressing using gauze sponges, not tissue or cotton balls. Peri-Wound Care: Sween Lotion (Moisturizing lotion) 1 x Per Week/30  Days Discharge Instructions: Apply moisturizing lotion as directed Topical: Mupirocin Ointment 1 x Per Week/30 Days Discharge Instructions: Apply Mupirocin (Bactroban) as instructed Prim Dressing: Hydrofera Blue Ready Transfer Foam, 2.5x2.5 (in/in) 1 x Per Week/30 Days ary Discharge Instructions: Apply directly to wound bed as directed Secondary Dressing: Woven Gauze Sponge, Non-Sterile 4x4 in 1 x Per Week/30 Days Discharge Instructions: Apply over primary dressing as directed. Com pression Wrap: ThreePress (3 layer  compression wrap) 1 x Per Week/30 Days Discharge Instructions: Apply three layer compression as directed. 02/04/2022: The wound is smaller by about a centimeter. There is some slough accumulation in the old biopsy site. The rest of the wound is clean. [name], [name] ([id number]) [id number] Page 9 of 11 [name] used a curette to debride the slough from the biopsy site. The rest of the wound did not require debridement. We will continue topical mupirocin, Hydrofera Blue, and 3 layer compression. Follow-up in 1 week. Electronic Signature(s) Signed: 02/04/2022 10:58:52 AM By: [name] MD FACS Entered By: [name] on 02/04/2022 10:58:52 -------------------------------------------------------------------------------- HxROS Details Patient Name: Date of Service: [name]. 02/04/2022 10:30 A M Medical Record Number: [id number] Patient Account Number: [id number] Date of Birth/Sex: Treating RN: [birth year] (77 y.o. F) Primary Care Provider: Domingo Mend Other Clinician: Referring Provider: Treating Provider/Extender: Caprice Kluver in Treatment: 19 Information Obtained From Patient Eyes Medical History: Positive for: Cataracts - Had cataract surgery Negative for: Glaucoma; Optic Neuritis Ear/Nose/Mouth/Throat Medical History: Positive for: Chronic sinus problems/congestion - Post Nasal drip Negative  for: Middle ear problems Hematologic/Lymphatic Medical History: Negative for: Anemia; Hemophilia; Human Immunodeficiency Virus; Lymphedema; Sickle Cell Disease Respiratory Medical History: Negative for: Aspiration; Asthma; Chronic Obstructive Pulmonary Disease (COPD); Pneumothorax; Sleep Apnea; Tuberculosis Cardiovascular Medical History: Positive for: Hypertension Gastrointestinal Medical History: Positive for: Crohns - Has Chrons Disease Negative for: Cirrhosis ; Colitis; Hepatitis A; Hepatitis B; Hepatitis C Past Medical History Notes: Has a large hiatal hernia. Has Chrons Disease., GERD, pancreatic insufficiency Endocrine Medical History: Negative for: Type [name] Diabetes; Type II Diabetes Past Medical History Notes: Pancreatic Insufficiency Genitourinary Medical History: Negative for: End Stage Renal Disease Past Medical History Notes: stage 3 Chronic Kidney disease CLARETTA, KENDRA [name] ([id number]) [id number] Page 10 of 11 Immunological Medical History: Negative for: Lupus Erythematosus; Raynauds; Scleroderma Integumentary (Skin) Medical History: Past Medical History Notes: Had Squamous Cell (skin cancer). Had a skin graft on Right knee (area) Musculoskeletal Medical History: Positive for: Osteoarthritis - Knees Negative for: Gout; Rheumatoid Arthritis; Osteomyelitis Past Medical History Notes: Had Bil knee replacements, hx cervical spine fx, sternal fx, thoracis spine fx Neurologic Medical History: Negative for: Dementia; Neuropathy; Quadriplegia; Paraplegia; Seizure Disorder Past Medical History Notes: left foot drop Oncologic Medical History: Negative for: Received Chemotherapy; Received Radiation Past Medical History Notes: Had Squamous Cell (skin cell) Psychiatric Medical History: Negative for: Anorexia/bulimia; Confinement Anxiety HBO Extended History Items Ear/Nose/Mouth/Throat: Eyes: Chronic sinus Cataracts  problems/congestion Immunizations Pneumococcal Vaccine: Received Pneumococcal Vaccination: Yes Received Pneumococcal Vaccination On or After 60th Birthday: Yes Tetanus Vaccine: Last tetanus shot: 11/16/2011 Implantable Devices None Hospitalization / Surgery History Type of Hospitalization/Surgery bil TKA Moh's procedure right leg 08/17/21 bil cataract extractions nasal fx surgery Family and Social History Cancer: No; Diabetes: Yes - Father; Heart Disease: Yes - Father; Hereditary Spherocytosis: No; Hypertension: Yes - Father; Kidney Disease: No; Lung Disease: No; Seizures: No; Stroke: No; Thyroid Problems: No; Tuberculosis: No; Former smoker - quit 30 years ago; Marital Status - Married; Alcohol Use: Never; Drug Use: No History; Caffeine Use: Rarely; Financial Concerns: No; Food, Clothing or Shelter Needs: No; Support System Lacking: No; Transportation Concerns: No Electronic Signature(s) Signed: 02/04/2022 12:30:26 PM By: [name] MD FACS Entered By: [name] on 02/04/2022 10:57:37 Carlyon, Parks Ranger ([id number]) [id number] Page 11 of 11 -------------------------------------------------------------------------------- SuperBill Details Patient Name: Date of Service: TREENA, COSMAN 02/04/2022 Medical Record Number: [id number] Patient Account Number: [id number] Date of Birth/Sex: Treating  RN: April 21, 1945 (77 y.o. F) Primary Care Provider: Domingo Mend Other Clinician: Referring Provider: Treating Provider/Extender: Gevena Cotton Weeks in Treatment: 19 Diagnosis Coding ICD-10 Codes Code Description [phone] 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d T81.31XS Disruption of external operation (surgical) wound, not elsewhere classified, sequela L98.9 Disorder of the skin and subcutaneous tissue, unspecified C44.722 Squamous cell carcinoma of skin of right lower limb, including  hip I10 Essential (primary) hypertension K50.90 Crohn's disease, unspecified, without complications G29 Unspecified severe protein-calorie malnutrition Facility Procedures : CPT4 Code: 52841324 Description: 40102 - DEBRIDE WOUND 1ST 20 SQ CM OR < ICD-10 Diagnosis Description L97.812 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 Modifier: d Quantity: 1 Physician Procedures : CPT4 Code Description Modifier [id number] - WC PHYS LEVEL 4 - EST PT 25 ICD-10 Diagnosis Description L97.812 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d T81.31XS Disruption of external operation (surgical)  wound, not elsewhere classified, sequela L98.9 Disorder of the skin and subcutaneous tissue, unspecified C44.722 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

## 2022-02-06 ENCOUNTER — Ambulatory Visit (INDEPENDENT_AMBULATORY_CARE_PROVIDER_SITE_OTHER): Payer: Medicare Other | Admitting: Internal Medicine

## 2022-02-06 ENCOUNTER — Encounter: Payer: Self-pay | Admitting: Internal Medicine

## 2022-02-06 VITALS — BP 136/78 | HR 96 | Ht 59.0 in | Wt 103.8 lb

## 2022-02-06 DIAGNOSIS — R109 Unspecified abdominal pain: Secondary | ICD-10-CM

## 2022-02-06 DIAGNOSIS — R197 Diarrhea, unspecified: Secondary | ICD-10-CM

## 2022-02-06 DIAGNOSIS — K5010 Crohn's disease of large intestine without complications: Secondary | ICD-10-CM | POA: Diagnosis not present

## 2022-02-06 DIAGNOSIS — K449 Diaphragmatic hernia without obstruction or gangrene: Secondary | ICD-10-CM

## 2022-02-06 DIAGNOSIS — K219 Gastro-esophageal reflux disease without esophagitis: Secondary | ICD-10-CM | POA: Diagnosis not present

## 2022-02-06 DIAGNOSIS — K589 Irritable bowel syndrome without diarrhea: Secondary | ICD-10-CM

## 2022-02-06 MED ORDER — DICYCLOMINE HCL 20 MG PO TABS: 20.0000 mg | ORAL_TABLET | Freq: Four times a day (QID) | ORAL | 3 refills | Status: DC | PRN

## 2022-02-06 MED ORDER — COLESTIPOL HCL 1 G PO TABS: 2.0000 g | ORAL_TABLET | Freq: Two times a day (BID) | ORAL | 3 refills | Status: DC

## 2022-02-06 MED ORDER — OMEPRAZOLE 40 MG PO CPDR: 40.0000 mg | DELAYED_RELEASE_CAPSULE | Freq: Two times a day (BID) | ORAL | 1 refills | Status: DC

## 2022-02-06 MED ORDER — MESALAMINE 1.2 G PO TBEC: 4.8000 g | DELAYED_RELEASE_TABLET | Freq: Every day | ORAL | 3 refills | Status: DC

## 2022-02-06 MED ORDER — DIPHENOXYLATE-ATROPINE 2.5-0.025 MG PO TABS: ORAL_TABLET | ORAL | 2 refills | Status: DC

## 2022-02-06 NOTE — Patient Instructions (Signed)
We have sent the following medications to your pharmacy for you to pick up at your convenience: Colestipol, Dicyclomine, Lomotil,Mesalamine, and Omeprazole   Follow up in  3 months- office will contact you to schedule an appointment.   _______________________________________________________  If your blood pressure at your visit was 140/90 or greater, please contact your primary care physician to follow up on this.  _______________________________________________________  If you are age 77 or older, your body mass index should be between 23-30. Your Body mass index is 20.97 kg/m. If this is out of the aforementioned range listed, please consider follow up with your Primary Care Provider.  If you are age 86 or younger, your body mass index should be between 19-25. Your Body mass index is 20.97 kg/m. If this is out of the aformentioned range listed, please consider follow up with your Primary Care Provider.   ________________________________________________________  The [facility] providers would like to encourage you to use [facility] to communicate with providers for non-urgent requests or questions.  Due to long hold times on the telephone, sending your provider a message by [facility] may be a faster and more efficient way to get a response.  Please allow 48 business hours for a response.  Please remember that this is for non-urgent requests.  _______________________________________________________  Thank you for choosing me and [facility].  Dr.John Henrene Pastor
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
## 2022-02-11 ENCOUNTER — Encounter (HOSPITAL_BASED_OUTPATIENT_CLINIC_OR_DEPARTMENT_OTHER): Payer: Medicare Other | Attending: General Surgery | Admitting: General Surgery

## 2022-02-11 DIAGNOSIS — L989 Disorder of the skin and subcutaneous tissue, unspecified: Secondary | ICD-10-CM | POA: Insufficient documentation

## 2022-02-11 DIAGNOSIS — K5090 Crohn's disease, unspecified, without complications: Secondary | ICD-10-CM | POA: Insufficient documentation

## 2022-02-11 DIAGNOSIS — T8131XS Disruption of external operation (surgical) wound, not elsewhere classified, sequela: Secondary | ICD-10-CM | POA: Diagnosis not present

## 2022-02-11 DIAGNOSIS — I10 Essential (primary) hypertension: Secondary | ICD-10-CM | POA: Insufficient documentation

## 2022-02-11 DIAGNOSIS — Y838 Other surgical procedures as the cause of abnormal reaction of the patient, or of later complication, without mention of misadventure at the time of the procedure: Secondary | ICD-10-CM | POA: Insufficient documentation

## 2022-02-11 DIAGNOSIS — C44722 Squamous cell carcinoma of skin of right lower limb, including hip: Secondary | ICD-10-CM | POA: Diagnosis not present

## 2022-02-11 DIAGNOSIS — L97812 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d: Secondary | ICD-10-CM | POA: Diagnosis present

## 2022-02-11 DIAGNOSIS — Z87891 Personal history of nicotine dependence: Secondary | ICD-10-CM | POA: Diagnosis not present

## 2022-02-11 DIAGNOSIS — E43 Unspecified severe protein-calorie malnutrition: Secondary | ICD-10-CM | POA: Diagnosis not present

## 2022-02-11 DIAGNOSIS — Z681 Body mass index (BMI) 19 or less, adult: Secondary | ICD-10-CM | POA: Diagnosis not present

## 2022-02-12 NOTE — Progress Notes (Signed)
[name], [name] ([id number]) [id number] Page 1 of 11 Visit Report for 02/11/2022 Chief Complaint Document Details Patient Name: Date of Service: [name], [name] 02/11/2022 10:30 A M Medical Record Number: [id number] Patient Account Number: [id number] Date of Birth/Sex: Treating RN: 09-19-1945 (77 y.o. F) Primary Care Provider: Domingo Mend Other Clinician: Referring Provider: Treating Provider/Extender: Caprice Kluver in Treatment: 20 Information Obtained from: Patient Chief Complaint 11/15/2020; patient is here for review of a wound area on the right posterior calf 09/20/2021: Patient is here for a nonhealing wound after undergoing Mohs surgery on her right lower leg. 12/07/2021: Reopening of skin at site of previous wound, new wound on right toe, concerning lesion on left upper leg just above knee Electronic Signature(s) Signed: 02/11/2022 11:16:41 AM By: [name] MD FACS Entered By: [name] on 02/11/2022 11:16:41 -------------------------------------------------------------------------------- Debridement Details Patient Name: Date of Service: [name]. 02/11/2022 10:30 A M Medical Record Number: [id number] Patient Account Number: [id number] Date of Birth/Sex: Treating RN: 20-Jan-1945 (77 y.o. [name], [name] Primary Care Provider: Domingo Mend Other Clinician: Referring Provider: Treating Provider/Extender: Gevena Cotton Weeks in Treatment: 20 Debridement Performed for Assessment: Wound #2 Right,Lateral Lower Leg Performed By: Physician [name], MD Debridement Type: Debridement Severity of Tissue Pre Debridement: Fat layer exposed Level of Consciousness (Pre-procedure): Awake and Alert Pre-procedure Verification/Time Out Yes - 10:50 Taken: Start Time: 10:51 Pain Control: Lidocaine 4% T opical Solution T Area Debrided (L x W): otal 0.4 (cm) x 0.5 (cm) = 0.2  (cm) Tissue and other material debrided: Non-Viable, Slough, Slough Level: Non-Viable Tissue Debridement Description: Selective/Open Wound Instrument: Forceps Bleeding: None Procedural Pain: 0 Post Procedural Pain: 0 Response to Treatment: Procedure was tolerated well Level of Consciousness (Post- Awake and Alert procedure): Post Debridement Measurements of Total Wound Length: (cm) 0.4 Width: (cm) 0.5 Depth: (cm) 0.1 Volume: (cm) 0.016 Character of Wound/Ulcer Post Debridement: Improved [name], [name] ([id number]) [id number] Page 2 of 11 Severity of Tissue Post Debridement: Fat layer exposed Post Procedure Diagnosis Same as Pre-procedure Notes Scribed for Dr. [name] by Baruch Gouty RN Electronic Signature(s) Signed: 02/11/2022 11:21:54 AM By: [name] MD FACS Signed: 02/11/2022 4:57:00 PM By: Baruch Gouty RN, BSN Entered By: Baruch Gouty on 02/11/2022 10:54:36 -------------------------------------------------------------------------------- HPI Details Patient Name: Date of Service: [name]. 02/11/2022 10:30 A M Medical Record Number: [id number] Patient Account Number: [id number] Date of Birth/Sex: Treating RN: 1945-05-16 (77 y.o. F) Primary Care Provider: Domingo Mend Other Clinician: Referring Provider: Treating Provider/Extender: Gevena Cotton Weeks in Treatment: 20 History of Present Illness HPI Description: ADMISSION 11/15/2020 This is a 77 year old woman with a history of squamous cell cancer skin cancer followed by Dr. Anabel Bene dermatology. She describes having a wound on the right posterior calf dating back since September. She saw her primary doctor in early October who suggested petroleum infused gauze. The patient has been doing this. No real improvement in fact she says this is getting larger. She does not complain much of pain. She is completely uncertain about how this happened there  was no trauma she simply became aware of it at some point describing it is about the size of a quarter but it has been getting progressively larger since then. She is here for review of this. Past medical history Crohn's disease not currently on prednisone, gastroesophageal reflux disease, history of squamous cell skin cancer, history of rheumatoid arthritis, hypertension 11/16; this  lady had a large superficial atypical wound on the right posterior calf. She has significant solar skin damage probably some degree of venous insufficiency but she described a rapidly expanding wound area. Because of this and a history of skin cancer [name] went ahead and did a shave biopsy but we do not have the results of this at the time we saw her in clinic today. She has been using silver alginate and kerlix 11/30; shave biopsy [name] did last time did not show any malignancy or fungal elements. Suggestion of a stasis ulcer. With our treatment which is simply been silver alginate with kerlix that she is changing daily the wound is down dramatically almost surprising how rapidly it is reduced in surface area 12/14; patient comes in today with her wound healed this was on the right posterior calf. She has significant solar skin damage probably some degree of venous insufficiency however she described a rapidly expanding wound area on admission. Because of this and a history of skin cancer [name] did do a shave biopsy but nothing was really found of worry. 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 Unfortunately, the wound has failed to heal. There is thick slough on the wound surface. The periwound skin is in reasonably good condition. There is granulation tissue forming underneath  the slough. 09/28/2021: The wound has deteriorated rather significantly over the past week. She had fairly copious drainage that she noticed as early as Monday, but she did not contact our office. Today there is a lot of periwound skin breakdown and a very pungent odor coming from the wound. There is thick slough on the wound surface but there is still good granulation tissue underlying this. Despite this, however, the wound actually measures smaller. 10/05/2021: The culture that [name] took grew out MRSA. [name] prescribed doxycycline and she is still taking this. There has been a tremendous improvement in the wound. It is smaller and has contracted considerably. There is hypertrophic granulation tissue present. No malodorous drainage. 10/16/2021: The wound has contracted further and is epithelializing. The surface is very clean and the hypertrophic granulation tissue has not recurred. She completed her course of doxycycline. 10/19; surgical wound on the right medial lower extremity secondary to Mohs surgery done in August. She is using Hydrofera Blue under 3 layer compression and making excellent progress. 11/02/2021: The wound is nearly completely epithelialized. Just a couple of open areas with some eschar overlying the surface. 11/12/2021: Just 1 small open area remains. It is clean without any slough or eschar accumulation. No hypertrophic granulation tissue present. 11/19/2021: Her wound is healed. READMISSION 12/07/2021 The patient has been moving for the past couple of weeks and has spent a substantial amount of time on her feet. As result, she developed leg swelling and KATIRIA, CALAME [name] ([id number]) [id number] Page 3 of 11 the tissue where her wound had been, which she was protecting with a Band-Aid, broke down considerably. She now has a large wound that is irregular with extensive slough, involving the fat layer. She also developed a blister on her toe that has slough and  eschar present. She also pointed out lump just above her left knee that she is concerned about, as she does have a history of multiple skin cancers. 12/11; patient  comes in the clinic with a large wound on the right anterior lower leg. This was initially a skin cancer underwent Mohs surgery and was treated in this clinic and healed out on 11/13. The patient states the wound reopened with edema and also trauma from a dressing she was putting on it. 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 She also has a wound on the right second toe. The patient states quite adamantly she will not go back to her previous dermatologist for the skin surgery center in [place] blaming them for the original wound on her right lateral leg. [name] am not exactly sure of her rationale but at this point she certainly will not consider a referral there 12/18; the patient has an appointment at [facility] on 12/28 although [name] am not sure exactly who she is seeing. This is in reference to the skin cancer just above her left patella. She would not go back to the skin surgery or indeed her dermatologist in [place]. She had an area on the right second toe this is healed today. Her original surgical site on the right anterior lateral lower leg actually looks some better. [name] changed her to Iodoflex last week 01/01/2022: The patient has an appointment with plastic surgery on Thursday to evaluate the biopsy-proven squamous cell carcinoma on her left upper leg. The wound on her right lower leg has a thick layer of slough on the surface and has a very atypical appearance. 01/08/2022: The biopsies that [name] took last week from the wound on her right lower extremity were negative for malignancy but did demonstrate some bacteria along with inflammation. She met with the plastic surgeon on Thursday and she is going to undergo wide local excision of the biopsy-proven squamous cell carcinoma on her left upper  leg. The right lower leg wound is narrower again this week. There is slough and eschar as well as some fibrinous debris in the areas where [name] took the biopsies and obtained hemostasis with silver nitrate. 01/14/2022: The wound is narrower today, but is beginning to accumulate hypertrophic granulation tissue. Thin layer of slough on the surface. There is still slough and fibrinous debris in the 2 biopsy sites. 01/21/2022: The wound continues to contract. There is still slough and nonviable subcutaneous tissue in both biopsy sites. 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 01/28/2022: About half the wound has epithelialized. The remaining open portion has hypertrophic granulation tissue present. 02/04/2022: The wound is smaller by about a centimeter. There is some slough accumulation in the old biopsy site. The rest of the wound is clean. 02/11/2022: The wound is down to just about a centimeter. There is still some slough accumulation at the old biopsy site. Electronic Signature(s) Signed: 02/11/2022 11:17:06 AM By: [name] MD FACS Entered By: [name] on 02/11/2022 11:17:06 -------------------------------------------------------------------------------- Physical Exam Details Patient Name: Date of Service: [name]. 02/11/2022 10:30 A M Medical Record Number: [id number] Patient Account Number: [id number] Date of Birth/Sex: Treating RN: 1945/09/21 (77 y.o. F) Primary Care Provider: Domingo Mend Other Clinician: Referring Provider: Treating Provider/Extender: Gevena Cotton Weeks in Treatment: 20 Constitutional Hypertensive, asymptomatic. . . . no acute distress. Respiratory Normal work of breathing on room air. Notes 02/11/2022: The wound is down to just about a centimeter. There  is still some slough  accumulation at the old biopsy site. Electronic Signature(s) Signed: 02/11/2022 11:18:32 AM By: [name] MD FACS Entered By: [name] on 02/11/2022 11:18:32 [name], [name] ([id number]) [id number] Page 4 of 11 -------------------------------------------------------------------------------- Physician Orders Details Patient Name: Date of Service: [name], [name] 02/11/2022 10:30 A M Medical Record Number: [id number] Patient Account Number: [id number] Date of Birth/Sex: Treating RN: December 19, 1945 (77 y.o. [name], [name] Primary Care Provider: Domingo Mend Other Clinician: Referring Provider: Treating Provider/Extender: Gevena Cotton Weeks in Treatment: 20 Verbal / Phone Orders: No Diagnosis Coding ICD-10 Coding Code Description [phone] 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d T81.31XS Disruption of external operation (surgical) wound, not elsewhere classified, sequela L98.9 Disorder of the skin and subcutaneous tissue, unspecified C44.722 Squamous cell carcinoma of skin of right lower limb, including hip I10 Essential (primary) hypertension K50.90 Crohn's disease, unspecified, without complications F09 Unspecified severe protein-calorie malnutrition Follow-up Appointments ppointment in 1 week. - Dr. [name] Rm 1 Return A Monday 2/12 @ 1030 am Anesthetic (In clinic) Topical Lidocaine 4% applied to wound bed Bathing/ Shower/ Hygiene May shower with protection but do not get wound dressing(s) wet. Protect dressing(s) with water repellant cover (for example, large plastic bag) or a cast cover and may then take shower. Edema Control - Lymphedema / SCD / Other Avoid standing for long periods of time. Moisturize legs daily. Wound Treatment Wound #2 - Lower Leg Wound Laterality: Right, Lateral Cleanser: Soap and Water 1 x Per Week/30 Days Discharge Instructions: May shower  and wash wound with dial antibacterial soap and water prior to dressing change. Cleanser: Wound Cleanser 1 x Per Week/30 Days Discharge Instructions: Cleanse the wound with wound cleanser prior to applying a clean dressing using gauze sponges, not tissue or cotton balls. Peri-Wound Care: Sween Lotion (Moisturizing lotion) 1 x Per Week/30 Days Discharge Instructions: Apply moisturizing lotion as directed Topical: Mupirocin Ointment 1 x Per Week/30 Days Discharge Instructions: Apply Mupirocin (Bactroban) as instructed Prim Dressing: Hydrofera Blue Ready Transfer Foam, 2.5x2.5 (in/in) 1 x Per Week/30 Days ary Discharge Instructions: Apply directly to wound bed as directed Secondary Dressing: Woven Gauze Sponge, Non-Sterile 4x4 in 1 x Per Week/30 Days Discharge Instructions: Apply over primary dressing as directed. Compression Wrap: ThreePress (3 layer compression wrap) 1 x Per Week/30 Days Discharge Instructions: Apply three layer compression , or equivalent 2 layer wrap Electronic Signature(s) Signed: 02/11/2022 11:21:54 AM By: [name] MD FACS Entered By: [name] on 02/11/2022 11:19:17 Poet, [name] ([id number]) [id number] Page 5 of 11 -------------------------------------------------------------------------------- Problem List Details Patient Name: Date of Service: KIVA, [place] 02/11/2022 10:30 A M Medical Record Number: [id number] Patient Account Number: [id number] Date of Birth/Sex: Treating RN: [birth year] (77 y.o. [name] Primary Care Provider: Domingo Mend Other Clinician: Referring Provider: Treating Provider/Extender: Gevena Cotton Weeks in Treatment: 20 Active Problems ICD-10 Encounter Code Description Active Date MDM Diagnosis L97.812 Non-pressure chronic ulcer of other part of right lower leg with fat layer 09/20/2021 No Yes exposed T81.31XS Disruption of external operation (surgical)  wound, not elsewhere classified, 09/20/2021 No Yes sequela L98.9 Disorder of the skin and subcutaneous tissue, unspecified 12/07/2021 No Yes C44.722 Squamous cell carcinoma of skin of right lower limb, including hip 09/20/2021 No Yes I10 Essential (primary) hypertension 09/20/2021 No Yes K50.90 Crohn's disease, unspecified, without complications 3/76/2831 No Yes E43 Unspecified severe protein-calorie malnutrition 09/20/2021 No Yes Inactive Problems  ICD-10 Code Description Active Date Inactive Date L97.512 Non-pressure chronic ulcer of other part of right foot with fat layer exposed 12/07/2021 12/07/2021 Resolved Problems Electronic Signature(s) Signed: 02/11/2022 11:16:04 AM By: [name] MD FACS Entered By: [name] on 02/11/2022 11:16:04 Progress Note Details -------------------------------------------------------------------------------- Haskell Flirt [name] ([id number]) [id number] Page 6 of 11 Patient Name: Date of Service: [name], [name]. 02/11/2022 10:30 A M Medical Record Number: [id number] Patient Account Number: [id number] Date of Birth/Sex: Treating RN: [birth year] (77 y.o. F) Primary Care Provider: Domingo Mend Other Clinician: Referring Provider: Treating Provider/Extender: Caprice Kluver in Treatment: 20 Subjective Chief Complaint Information obtained from Patient 11/15/2020; patient is here for review of a wound area on the right posterior calf 09/20/2021: Patient is here for a nonhealing wound after undergoing Mohs surgery on her right lower leg. 12/07/2021: Reopening of skin at site of previous wound, new wound on right toe, concerning lesion on left upper leg just above knee History of Present Illness (HPI) ADMISSION 11/15/2020 This is a 77 year old woman with a history of squamous cell cancer skin cancer followed by Dr. Anabel Bene dermatology. She describes having a wound on the right posterior calf dating  back since September. She saw her primary doctor in early October who suggested petroleum infused gauze. The patient has been doing this. No real improvement in fact she says this is getting larger. She does not complain much of pain. She is completely uncertain about how this happened there was no trauma she simply became aware of it at some point describing it is about the size of a quarter but it has been getting progressively larger since then. She is here for review of this. Past medical history Crohn's disease not currently on prednisone, gastroesophageal reflux disease, history of squamous cell skin cancer, history of rheumatoid arthritis, hypertension 11/16; this lady had a large superficial atypical wound on the right posterior calf. She has significant solar skin damage probably some degree of venous insufficiency but she described a rapidly expanding wound area. Because of this and a history of skin cancer [name] went ahead and did a shave biopsy but we do not have the results of this at the time we saw her in clinic today. She has been using silver alginate and kerlix 11/30; shave biopsy [name] did last time did not show any malignancy or fungal elements. Suggestion of a stasis ulcer. With our treatment which is simply been silver alginate with kerlix that she is changing daily the wound is down dramatically almost surprising how rapidly it is reduced in surface area 12/14; patient comes in today with her wound healed this was on the right posterior calf. She has significant solar skin damage probably some degree of venous insufficiency however she described a rapidly expanding wound area on admission. Because of this and a history of skin cancer [name] did do a shave biopsy but nothing was really found of worry. She tells me that she had biopsies and her gastroenterologist is now saying she has "colitis" rather than Crohn's disease and she is now on steroids orally READMISSION 09/20/2021 She returns to  clinic today having undergone a Mohs procedure on her right anterior tibial surface on August 17, 2021. Unfortunately, the wound has failed to heal. There is thick slough on the wound surface. The periwound skin is in reasonably good condition. There is granulation tissue forming underneath the slough. 09/28/2021: The wound has deteriorated rather significantly over the past week. She had fairly  copious drainage that she noticed as early as Monday, but she did not contact our office. Today there is a lot of periwound skin breakdown and a very pungent odor coming from the wound. There is thick slough on the wound surface but there is still good granulation tissue underlying this. Despite this, however, the wound actually measures smaller. 10/05/2021: The culture that [name] took grew out MRSA. [name] prescribed doxycycline and she is still taking this. There has been a tremendous improvement in the wound. It is smaller and has contracted considerably. There is hypertrophic granulation tissue present. No malodorous drainage. 10/16/2021: The wound has contracted further and is epithelializing. The surface is very clean and the hypertrophic granulation tissue has not recurred. She completed her course of doxycycline. 10/19; surgical wound on the right medial lower extremity secondary to Mohs surgery done in August. She is using Hydrofera Blue under 3 layer compression and making excellent progress. 11/02/2021: The wound is nearly completely epithelialized. Just a couple of open areas with some eschar overlying the surface. 11/12/2021: Just 1 small open area remains. It is clean without any slough or eschar accumulation. No hypertrophic granulation tissue present. 11/19/2021: Her wound is healed. READMISSION 12/07/2021 The patient has been moving for the past couple of weeks and has spent a substantial amount of time on her feet. As result, she developed leg swelling and the tissue where her wound had been, which she  was protecting with a Band-Aid, broke down considerably. She now has a large wound that is irregular with extensive slough, involving the fat layer. She also developed a blister on her toe that has slough and eschar present. She also pointed out lump just above her left knee that she is concerned about, as she does have a history of multiple skin cancers. 12/11; patient comes in the clinic with a large wound on the right anterior lower leg. This was initially a skin cancer underwent Mohs surgery and was treated in this clinic and healed out on 11/13. The patient states the wound reopened with edema and also trauma from a dressing she was putting on it. 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 She also has a wound on the right second toe. The patient states quite adamantly she will not go back to her previous dermatologist for the skin surgery center in [place] blaming them for the original wound on her right lateral leg. [name] am not exactly sure of her rationale but at this point she certainly will not consider a referral there 12/18; the patient has an appointment at [facility] on 12/28 although [name] am not sure exactly who she is seeing. This is in reference to the skin cancer just above her left patella. She would not go back to the skin surgery or indeed her dermatologist in [place]. She had an area on the right second toe this is healed today. Her original surgical site on the right anterior lateral lower leg actually looks some better. [name] changed her to Iodoflex last week 01/01/2022: The patient has an appointment with plastic surgery on Thursday to evaluate the biopsy-proven squamous cell carcinoma on her left upper leg. The wound on her right lower leg has a thick layer of slough on the surface and has a very atypical appearance. [name], [name] ([id number]) [id number] Page 7 of 11 01/08/2022: The biopsies that [name] took last week  from the wound on her right lower extremity were negative for malignancy but did demonstrate some  bacteria along with inflammation. She met with the plastic surgeon on Thursday and she is going to undergo wide local excision of the biopsy-proven squamous cell carcinoma on her left upper leg. The right lower leg wound is narrower again this week. There is slough and eschar as well as some fibrinous debris in the areas where [name] took the biopsies and obtained hemostasis with silver nitrate. 01/14/2022: The wound is narrower today, but is beginning to accumulate hypertrophic granulation tissue. Thin layer of slough on the surface. There is still slough and fibrinous debris in the 2 biopsy sites. 01/21/2022: The wound continues to contract. There is still slough and nonviable subcutaneous tissue in both biopsy sites. 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 01/28/2022: About half the wound has epithelialized. The remaining open portion has hypertrophic granulation tissue present. 02/04/2022: The wound is smaller by about a centimeter. There is some slough accumulation in the old biopsy site. The rest of the wound is clean. 02/11/2022: The wound is down to just about a centimeter. There is still some slough accumulation at the old biopsy site. Patient History Information obtained from Patient. Family History Diabetes - Father, Heart Disease - Father, Hypertension - Father, No family history of Cancer, Hereditary Spherocytosis, Kidney Disease, Lung Disease, Seizures, Stroke, Thyroid Problems, Tuberculosis. Social History Former smoker - quit 30 years ago, Marital Status - Married, Alcohol Use - Never, Drug Use - No History, Caffeine Use - Rarely. Medical History Eyes Patient has history of Cataracts - Had cataract surgery Denies history of Glaucoma, Optic  Neuritis Ear/Nose/Mouth/Throat Patient has history of Chronic sinus problems/congestion - Post Nasal drip Denies history of Middle ear problems Hematologic/Lymphatic Denies history of Anemia, Hemophilia, Human Immunodeficiency Virus, Lymphedema, Sickle Cell Disease Respiratory Denies history of Aspiration, Asthma, Chronic Obstructive Pulmonary Disease (COPD), Pneumothorax, Sleep Apnea, Tuberculosis Cardiovascular Patient has history of Hypertension Gastrointestinal Patient has history of Crohnoos - Has Chrons Disease Denies history of Cirrhosis , Colitis, Hepatitis A, Hepatitis B, Hepatitis C Endocrine Denies history of Type [name] Diabetes, Type II Diabetes Genitourinary Denies history of End Stage Renal Disease Immunological Denies history of Lupus Erythematosus, Raynaudoos, Scleroderma Musculoskeletal Patient has history of Osteoarthritis - Knees Denies history of Gout, Rheumatoid Arthritis, Osteomyelitis Neurologic Denies history of Dementia, Neuropathy, Quadriplegia, Paraplegia, Seizure Disorder Oncologic Denies history of Received Chemotherapy, Received Radiation Psychiatric Denies history of Anorexia/bulimia, Confinement Anxiety Hospitalization/Surgery History - bil TKA. - Moh's procedure right leg 08/17/21. - bil cataract extractions. - nasal fx surgery. Medical A Surgical History Notes nd Gastrointestinal Has a large hiatal hernia. Has Chrons Disease., GERD, pancreatic insufficiency Endocrine Pancreatic Insufficiency Genitourinary stage 3 Chronic Kidney disease Integumentary (Skin) Had Squamous Cell (skin cancer). Had a skin graft on Right knee (area) Musculoskeletal Had Bil knee replacements, hx cervical spine fx, sternal fx, thoracis spine fx Neurologic left foot drop Oncologic Had Squamous Cell (skin cell) Objective Constitutional ANIA, LEVAY [name] ([id number]) [id number] Page 8 of 11 Hypertensive, asymptomatic. no acute  distress. Vitals Time Taken: 10:25 AM, Height: 60 in, Weight: 101 lbs, BMI: 19.7, Temperature: 97.6 F, Pulse: 91 bpm, Respiratory Rate: 20 breaths/min, Blood Pressure: 178/77 mmHg. Respiratory Normal work of breathing on room air. General Notes: 02/11/2022: The wound is down to just about a centimeter. There is still some slough accumulation at the old biopsy site. Integumentary (Hair, Skin) Wound #2  status is Open. Original cause of wound was Surgical Injury. The date acquired was: 08/17/2021. The wound has been in treatment 20 weeks. The wound is located on the Right,Lateral Lower Leg. The wound measures 0.4cm length x 0.5cm width x 0.1cm depth; 0.157cm^2 area and 0.016cm^3 volume. There is Fat Layer (Subcutaneous Tissue) exposed. There is no tunneling or undermining noted. There is a medium amount of serosanguineous drainage noted. The wound margin is flat and intact. There is large (67-100%) red, hyper - granulation within the wound bed. There is a small (1-33%) amount of necrotic tissue within the wound bed including Adherent Slough. The periwound skin appearance had no abnormalities noted for texture. The periwound skin appearance had no abnormalities noted for moisture. The periwound skin appearance had no abnormalities noted for color. Periwound temperature was noted as No Abnormality. 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 There was a Selective/Open Wound Non-Viable Tissue Debridement with a total area of 0.2 sq cm performed by [name], MD. With the following instrument(s): Forceps to remove Non-Viable tissue/material. Material removed includes Slough after achieving pain control using Lidocaine 4% Topical Solution. No specimens were taken. A time out was conducted at 10:50, prior to the start of the procedure. There was no bleeding. The procedure was tolerated well with a pain level of 0 throughout and a pain level of 0 following the procedure. Post Debridement Measurements: 0.4cm length x 0.5cm width x 0.1cm depth; 0.016cm^3 volume. Character of Wound/Ulcer Post Debridement is improved. Severity of Tissue Post Debridement is: Fat layer exposed. Post procedure Diagnosis Wound #2: Same as Pre-Procedure General Notes: Scribed for Dr. [name] by Baruch Gouty RN. Pre-procedure diagnosis of Wound #2 is an Open Surgical Wound located on the Right,Lateral Lower Leg . There was a Double Layer Compression Therapy Procedure by Baruch Gouty, RN. Post procedure Diagnosis Wound #2: Same as Pre-Procedure Notes: Urgo lite. Plan Follow-up Appointments: Return Appointment in 1 week. - Dr. [name] Rm 1 Monday 2/12 @ 1030 am Anesthetic: (In clinic) Topical Lidocaine 4% applied to wound bed Bathing/ Shower/ Hygiene: May shower with protection but do not get wound dressing(s) wet. Protect dressing(s) with water repellant cover (for example, large plastic bag) or a cast cover and may then take shower. Edema Control - Lymphedema / SCD / Other: Avoid standing for long periods of time. Moisturize legs daily. WOUND #2: - Lower Leg Wound Laterality: Right, Lateral Cleanser: Soap and Water 1 x Per Week/30 Days Discharge Instructions: May shower and wash wound with dial antibacterial soap and water prior to dressing change. Cleanser: Wound Cleanser 1 x Per Week/30 Days Discharge  Instructions: Cleanse the wound with wound cleanser prior to applying a clean dressing using gauze sponges, not tissue or cotton balls. Peri-Wound Care: Sween Lotion (Moisturizing lotion) 1 x Per Week/30 Days Discharge Instructions: Apply moisturizing lotion as directed Topical: Mupirocin Ointment 1 x Per Week/30 Days Discharge Instructions: Apply Mupirocin (Bactroban) as instructed Prim Dressing: Hydrofera Blue Ready Transfer Foam, 2.5x2.5 (in/in) 1 x Per Week/30 Days ary Discharge Instructions:  Apply directly to wound bed as directed Secondary Dressing: Woven Gauze Sponge, Non-Sterile 4x4 in 1 x Per Week/30 Days Discharge Instructions: Apply over primary dressing as directed. [name], [name] ([id number]) [id number] Page 9 of 11 Compression Wrap: ThreePress (3 layer compression wrap) 1 x Per Week/30 Days Discharge Instructions: Apply three layer compression , or equivalent 2 layer wrap 02/11/2022: The wound is down to just about a centimeter. There is still some slough accumulation at the old biopsy site. The biopsy site was so small, that my smallest (#3) curette would not fit. [name] fashioned a debridement tool using the wire end of a skinny probe and used this, along with forceps to remove the slough from the site. We will continue topical mupirocin, Hydrofera Blue, and 3 layer compression. [name] anticipate that she will be healed in 1 to 2 weeks. Electronic Signature(s) Signed: 02/11/2022 11:20:07 AM By: [name] MD FACS Entered By: [name] on 02/11/2022 11:20:06 -------------------------------------------------------------------------------- HxROS Details Patient Name: Date of Service: [name]. 02/11/2022 10:30 A M Medical Record Number: [id number] Patient Account Number: [id number] Date of Birth/Sex: Treating RN: 1945/11/05 (77 y.o. F) Primary Care Provider: Domingo Mend Other Clinician: Referring Provider: Treating Provider/Extender:  Gevena Cotton Weeks in Treatment: 20 Information Obtained From Patient Eyes Medical History: Positive for: Cataracts - Had cataract surgery Negative for: Glaucoma; Optic Neuritis Ear/Nose/Mouth/Throat Medical History: Positive for: Chronic sinus problems/congestion - Post Nasal drip Negative for: Middle ear problems Hematologic/Lymphatic Medical History: Negative for: Anemia; Hemophilia; Human Immunodeficiency Virus; Lymphedema; Sickle Cell Disease Respiratory Medical History: Negative for: Aspiration; Asthma; Chronic Obstructive Pulmonary Disease (COPD); Pneumothorax; Sleep Apnea; Tuberculosis Cardiovascular Medical History: Positive for: Hypertension Gastrointestinal Medical History: Positive for: Crohns - Has Chrons Disease Negative for: Cirrhosis ; Colitis; Hepatitis A; Hepatitis B; Hepatitis C Past Medical History Notes: Has a large hiatal hernia. Has Chrons Disease., GERD, pancreatic insufficiency Endocrine Medical History: Negative for: Type [name] Diabetes; Type II Diabetes Past Medical History Notes: Pancreatic Insufficiency [name], [name] ([id number]) [id number] Page 10 of 11 Genitourinary Medical History: Negative for: End Stage Renal Disease Past Medical History Notes: stage 3 Chronic Kidney disease Immunological Medical History: Negative for: Lupus Erythematosus; Raynauds; Scleroderma Integumentary (Skin) Medical History: Past Medical History Notes: Had Squamous Cell (skin cancer). Had a skin graft on Right knee (area) Musculoskeletal Medical History: Positive for: Osteoarthritis - Knees Negative for: Gout; Rheumatoid Arthritis; Osteomyelitis Past Medical History Notes: Had Bil knee replacements, hx cervical spine fx, sternal fx, thoracis spine fx Neurologic Medical History: Negative for: Dementia; Neuropathy; Quadriplegia; Paraplegia; Seizure Disorder Past Medical History Notes: left foot  drop Oncologic Medical History: Negative for: Received Chemotherapy; Received Radiation Past Medical History Notes: Had Squamous Cell (skin cell) Psychiatric Medical History: Negative for: Anorexia/bulimia; Confinement Anxiety HBO Extended History Items Ear/Nose/Mouth/Throat: Eyes: Chronic sinus Cataracts problems/congestion Immunizations Pneumococcal Vaccine: Received Pneumococcal Vaccination: Yes Received Pneumococcal Vaccination On or After 60th Birthday: Yes Tetanus Vaccine: Last tetanus shot: 11/16/2011 Implantable Devices None Hospitalization / Surgery History Type of Hospitalization/Surgery bil TKA Moh's procedure right leg 08/17/21 bil cataract extractions nasal fx surgery Family and Social History Cancer: No; Diabetes: Yes - Father; Heart Disease: Yes - Father; Hereditary Spherocytosis: No; Hypertension: Yes - Father; Kidney Disease: No; Lung Disease: No; Seizures: No; Stroke: No; Thyroid Problems: No; Tuberculosis: No; Former smoker - quit 30 years ago; Marital Status - Married; Alcohol Use: Never; Drug Use: No History; Caffeine Use: Rarely; Financial Concerns: No; Food, Clothing or Shelter Needs: No; Support  System Lacking: No; Transportation Concerns: No Electronic Signature(s) Signed: 02/11/2022 11:21:54 AM By: [name] MD FACS [name], [name] 02/11/2022 11:21:54 AM By: [name] MD FACS Signed: I ([id number]) [id number] Page 11 of 11 Entered By: [name] on 02/11/2022 11:17:36 -------------------------------------------------------------------------------- SuperBill Details Patient Name: Date of Service: [name], [name] 02/11/2022 Medical Record Number: [id number] Patient Account Number: [id number] Date of Birth/Sex: Treating RN: [birth year] (77 y.o. 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, including hip I10 Essential (primary) hypertension K50.90 Crohn's disease, unspecified, without complications P01 Unspecified severe protein-calorie malnutrition Facility Procedures : CPT4 Code: [id number] Description: 43888 - DEBRIDE WOUND 1ST 20 SQ CM OR < ICD-10 Diagnosis Description L97.812 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 Modifier: d Quantity: 1 Physician Procedures : CPT4 Code Description Modifier [id number] - WC PHYS LEVEL 4 - EST PT 25 ICD-10 Diagnosis Description L97.812 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d T81.31XS Disruption of external operation (surgical)  wound, not elsewhere classified, sequela C44.722 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

## 2022-02-12 NOTE — Progress Notes (Signed)
TANIS, HENSARLING I ([id number]) [id number] Page 1 of 8 Visit Report for 02/11/2022 Arrival Information Details Patient Name: Date of Service: [name], [name] 02/11/2022 10:30 A M Medical Record Number: [id number] Patient Account Number: [id number] Date of Birth/Sex: Treating RN: 11-Dec-1945 (77 y.o. F) Primary Care Miku Udall: Domingo Mend Other Clinician: Referring Sabino Denning: Treating Baby Stairs/Extender: Caprice Kluver in Treatment: 20 Visit Information History Since Last Visit All ordered tests and consults were completed: No Patient Arrived: Ambulatory Added or deleted any medications: No Arrival Time: 10:22 Any new allergies or adverse reactions: No Accompanied By: self Had a fall or experienced change in No Transfer Assistance: None activities of daily living that may affect Patient Identification Verified: Yes risk of falls: Secondary Verification Process Completed: Yes Signs or symptoms of abuse/neglect since last visito No Patient Requires Transmission-Based Precautions: No Hospitalized since last visit: No Patient Has Alerts: No Implantable device outside of the clinic excluding No cellular tissue based products placed in the center since last visit: Has Dressing in Place as Prescribed: Yes Has Compression in Place as Prescribed: Yes Pain Present Now: No Electronic Signature(s) Signed: 02/11/2022 4:57:00 PM By: Baruch Gouty RN, BSN Entered By: Baruch Gouty on 02/11/2022 10:43:26 -------------------------------------------------------------------------------- Compression Therapy Details Patient Name: Date of Service: April Austin I. 02/11/2022 10:30 A M Medical Record Number: [id number] Patient Account Number: [id number] Date of Birth/Sex: Treating RN: 1945-03-25 (77 y.o. [name] Primary Care Alexanderia Gorby: Domingo Mend Other Clinician: Referring Jorrell [name]: Treating [name]/Extender: [name] in Treatment: 20 Compression Therapy Performed for Wound Assessment: Wound #2 Right,Lateral Lower Leg Performed By: Clinician Baruch Gouty, RN Compression Type: Double Layer Post Procedure Diagnosis Same as Pre-procedure Notes Urgo lite Electronic Signature(s) Signed: 02/11/2022 4:57:00 PM By: Baruch Gouty RN, BSN Entered By: Baruch Gouty on 02/11/2022 10:52:36 Jenison, Parks Ranger ([id number]) [id number] Page 2 of 8 -------------------------------------------------------------------------------- Encounter Discharge Information Details Patient Name: Date of Service: [name], [name] 02/11/2022 10:30 A M Medical Record Number: [id number] Patient Account Number: [id number] Date of Birth/Sex: Treating RN: Oct 01, 1945 (77 y.o. [name] Primary Care Luwana Butrick: Domingo Mend Other Clinician: Referring Kaile Bixler: Treating Chales Pelissier/Extender: [name] in Treatment: 20 Encounter Discharge Information Items Post Procedure Vitals Discharge Condition: Stable Temperature (F): 97.6 Ambulatory Status: Ambulatory Pulse (bpm): 91 Discharge Destination: Home Respiratory Rate (breaths/min): 18 Transportation: Private Auto Blood Pressure (mmHg): 178/77 Accompanied By: self Schedule Follow-up Appointment: Yes Clinical Summary of Care: Patient Declined Electronic Signature(s) Signed: 02/11/2022 4:57:00 PM By: Baruch Gouty RN, BSN Entered By: Baruch Gouty on 02/11/2022 11:10:17 -------------------------------------------------------------------------------- Lower Extremity Assessment Details Patient Name: Date of Service: April Austin I. 02/11/2022 10:30 A M Medical Record Number: [id number] Patient Account Number: [id number] Date of Birth/Sex: Treating RN: Aug 24, 1945 (77 y.o. [name] Primary Care Tayva Easterday: Domingo Mend Other Clinician: Referring Dianelly Ferran: Treating  [name]/Extender: [name] in Treatment: 20 Edema Assessment Assessed: [Left: No] [Right: No] Edema: [Left: N] [Right: o] Calf Left: Right: Point of Measurement: From Medial Instep 28 cm Ankle Left: Right: Point of Measurement: From Medial Instep 18 cm Vascular Assessment Pulses: Dorsalis Pedis Palpable: [Right:Yes] Electronic Signature(s) Signed: 02/11/2022 4:57:00 PM By: Baruch Gouty RN, BSN Entered By: Baruch Gouty on 02/11/2022 10:44:35 Chaisson, Parks Ranger ([id number]) [id number] Page 3 of 8 -------------------------------------------------------------------------------- Multi Wound Chart Details Patient Name: Date of Service: [name], [name] I. 02/11/2022 10:30 A M Medical Record  Number: [id number] Patient Account Number: [id number] Date of Birth/Sex: Treating RN: 19-Jan-1945 (77 y.o. F) Primary Care Boen Sterbenz: Domingo Mend Other Clinician: Referring Christna Kulick: Treating [name]/Extender: [name] in Treatment: 20 Vital Signs Height(in): 60 Pulse(bpm): 91 Weight(lbs): 101 Blood Pressure(mmHg): 178/77 Body Mass Index(BMI): 19.7 Temperature(F): 97.6 Respiratory Rate(breaths/min): 20 [2:Photos:] [N/A:N/A] Right, Lateral Lower Leg N/A N/A Wound Location: Surgical Injury N/A N/A Wounding Event: Open Surgical Wound N/A N/A Primary Etiology: Venous Leg Ulcer N/A N/A Secondary Etiology: Cataracts, Chronic sinus N/A N/A Comorbid History: problems/congestion, Hypertension, Crohns, Osteoarthritis 08/17/2021 N/A N/A Date Acquired: 20 N/A N/A [name] of Treatment: Open N/A N/A Wound Status: No N/A N/A Wound Recurrence: Yes N/A N/A Clustered Wound: 0.4x0.5x0.1 N/A N/A Measurements L x W x D (cm) 0.157 N/A N/A A (cm) : rea 0.016 N/A N/A Volume (cm) : 99.10% N/A N/A % Reduction in A rea: 99.10% N/A N/A % Reduction in Volume: Full Thickness Without Exposed N/A  N/A Classification: Support Structures Medium N/A N/A Exudate A mount: Serosanguineous N/A N/A Exudate Type: red, brown N/A N/A Exudate Color: Flat and Intact N/A N/A Wound Margin: Large (67-100%) N/A N/A Granulation A mount: Red, Hyper-granulation N/A N/A Granulation Quality: Small (1-33%) N/A N/A Necrotic A mount: Fat Layer (Subcutaneous Tissue): Yes N/A N/A Exposed Structures: Fascia: No Tendon: No Muscle: No Joint: No Bone: No Small (1-33%) N/A N/A Epithelialization: Debridement - Selective/Open Wound N/A N/A Debridement: Pre-procedure Verification/Time Out 10:50 N/A N/A Taken: Lidocaine 4% Topical Solution N/A N/A Pain Control: Slough N/A N/A Tissue Debrided: Non-Viable Tissue N/A N/A Level: 0.2 N/A N/A Debridement A (sq cm): rea Forceps N/A N/A Instrument: None N/A N/A Bleeding: 0 N/A N/A Procedural Pain: 0 N/A N/A Post Procedural Pain: Procedure was tolerated well N/A N/A Debridement Treatment Response: 0.4x0.5x0.1 N/A N/A Post Debridement Measurements L x W x D (cm) Siple, [name] I ([id number]) [id number] Page 4 of 8 0.016 N/A N/A Post Debridement Volume: (cm) Excoriation: No N/A N/A Periwound Skin Texture: Induration: No Callus: No Crepitus: No Rash: No Scarring: No Dry/Scaly: No N/A N/A Periwound Skin Moisture: Atrophie Blanche: No N/A N/A Periwound Skin Color: Cyanosis: No Ecchymosis: No Erythema: No Hemosiderin Staining: No Mottled: No Pallor: No Rubor: No No Abnormality N/A N/A Temperature: Compression Therapy N/A N/A Procedures Performed: Debridement Treatment Notes Wound #2 (Lower Leg) Wound Laterality: Right, Lateral Cleanser Soap and Water Discharge Instruction: May shower and wash wound with dial antibacterial soap and water prior to dressing change. Wound Cleanser Discharge Instruction: Cleanse the wound with wound cleanser prior to applying a clean dressing using gauze sponges, not  tissue or cotton balls. Peri-Wound Care Sween Lotion (Moisturizing lotion) Discharge Instruction: Apply moisturizing lotion as directed Topical Mupirocin Ointment Discharge Instruction: Apply Mupirocin (Bactroban) as instructed Primary Dressing Hydrofera Blue Ready Transfer Foam, 2.5x2.5 (in/in) Discharge Instruction: Apply directly to wound bed as directed Secondary Dressing Woven Gauze Sponge, Non-Sterile 4x4 in Discharge Instruction: Apply over primary dressing as directed. Secured With Compression Wrap ThreePress (3 layer compression wrap) Discharge Instruction: Apply three layer compression , or equivalent 2 layer wrap Compression Stockings Add-Ons Electronic Signature(s) Signed: 02/11/2022 11:16:11 AM By: [name] MD FACS Entered By: [name] on 02/11/2022 11:16:11 -------------------------------------------------------------------------------- Multi-Disciplinary Care Plan Details Patient Name: Date of Service: April Austin I. 02/11/2022 10:30 A M Medical Record Number: [id number] Patient Account Number: [id number] Date of Birth/Sex: Treating RN: [birth year] (77 y.o. [name] Primary Care Cyd Hostler: Domingo Mend Other Clinician: Referring Delano Scardino: Treating Arabia Nylund/Extender: [name],  Montel Clock , Estela [name] in Treatment: Mitchell, Decherd I ([id number]) [id number] Page 5 of 8 Multidisciplinary Care Plan reviewed with physician Active Inactive Abuse / Safety / Falls / Self Care Management Nursing Diagnoses: History of Falls Potential for falls Goals: Patient will not experience any injury related to falls Date Initiated: 12/07/2021 Target Resolution Date: 02/25/2022 Goal Status: Active Patient/caregiver will verbalize/demonstrate measures taken to prevent injury and/or falls Date Initiated: 09/20/2021 Date Inactivated: 10/19/2021 Target Resolution Date: 10/18/2021 Goal Status: Met Interventions: Assess  fall risk on admission and as needed Assess impairment of mobility on admission and as needed per policy Notes: Pressure Nursing Diagnoses: Knowledge deficit related to causes and risk factors for pressure ulcer development Goals: Patient will remain free of pressure ulcers Date Initiated: 12/07/2021 Target Resolution Date: 02/25/2022 Goal Status: Active Interventions: Assess potential for pressure ulcer upon admission and as needed Notes: Electronic Signature(s) Signed: 02/11/2022 4:57:00 PM By: Baruch Gouty RN, BSN Entered By: Baruch Gouty on 02/11/2022 10:46:11 -------------------------------------------------------------------------------- Pain Assessment Details Patient Name: Date of Service: April Austin I. 02/11/2022 10:30 A M Medical Record Number: [id number] Patient Account Number: [id number] Date of Birth/Sex: Treating RN: 1945-01-21 (77 y.o. F) Primary Care Katrena Stehlin: Domingo Mend Other Clinician: Referring Kaynen Minner: Treating Versie Soave/Extender: [name] in Treatment: 20 Active Problems Location of Pain Severity and Description of Pain Patient Has Paino No Site Locations Rate the pain. BRAILEIGH, [name] I ([id number]) [id number] Page 6 of 8 Rate the pain. Current Pain Level: 0 Pain Management and Medication Current Pain Management: Electronic Signature(s) Signed: 02/11/2022 4:57:00 PM By: Baruch Gouty RN, BSN Entered By: Baruch Gouty on 02/11/2022 10:43:42 -------------------------------------------------------------------------------- Patient/Caregiver Education Details Patient Name: Date of Service: [name] 2/5/2024andnbsp10:30 A M Medical Record Number: [id number] Patient Account Number: [id number] Date of Birth/Gender: Treating RN: Jul 24, 1945 (77 y.o. [name] Primary Care Physician: Domingo Mend Other Clinician: Referring Physician: Treating Physician/Extender:  Caprice Kluver in Treatment: 20 Education Assessment Education Provided To: Patient Education Topics Provided Venous: Methods: Explain/Verbal Responses: Reinforcements needed, State content correctly Wound/Skin Impairment: Methods: Explain/Verbal Responses: Reinforcements needed, State content correctly Electronic Signature(s) Signed: 02/11/2022 4:57:00 PM By: Baruch Gouty RN, BSN Entered By: Baruch Gouty on 02/11/2022 10:46:33 -------------------------------------------------------------------------------- Wound Assessment Details Patient Name: Date of Service: April Austin I. 02/11/2022 10:30 A GYNETH, [name] I ([id number]) [id number] Page 7 of 8 Medical Record Number: [id number] Patient Account Number: [id number] Date of Birth/Sex: Treating RN: Dec 14, 1945 (77 y.o. F) Primary Care Conna Terada: Domingo Mend Other Clinician: Referring Aaliyah Gavel: Treating Shantel Helwig/Extender: [name] in Treatment: 20 Wound Status Wound Number: 2 Primary Open Surgical Wound Etiology: Wound Location: Right, Lateral Lower Leg Secondary Venous Leg Ulcer Wounding Event: Surgical Injury Etiology: Date Acquired: 08/17/2021 Wound Status: Open [name] Of Treatment: 20 Comorbid Cataracts, Chronic sinus problems/congestion, Hypertension, Clustered Wound: Yes History: Crohns, Osteoarthritis Photos Wound Measurements Length: (cm) 0.4 Width: (cm) 0.5 Depth: (cm) 0.1 Area: (cm) 0.157 Volume: (cm) 0.016 % Reduction in Area: 99.1% % Reduction in Volume: 99.1% Epithelialization: Small (1-33%) Tunneling: No Undermining: No Wound Description Classification: Full Thickness Without Exposed Support Structures Wound Margin: Flat and Intact Exudate Amount: Medium Exudate Type: Serosanguineous Exudate Color: red, brown Foul Odor After Cleansing: No Slough/Fibrino Yes Wound Bed Granulation Amount: Large  (67-100%) Exposed Structure Granulation Quality: Red, Hyper-granulation Fascia Exposed: No Necrotic Amount: Small (1-33%) Fat Layer (Subcutaneous Tissue) Exposed: Yes Necrotic Quality: Adherent Slough Tendon Exposed:  No Muscle Exposed: No Joint Exposed: No Bone Exposed: No Periwound Skin Texture Texture Color No Abnormalities Noted: Yes No Abnormalities Noted: Yes Moisture Temperature / Pain No Abnormalities Noted: Yes Temperature: No Abnormality Treatment Notes Wound #2 (Lower Leg) Wound Laterality: Right, Lateral Cleanser Soap and Water Discharge Instruction: May shower and wash wound with dial antibacterial soap and water prior to dressing change. Wound Cleanser Discharge Instruction: Cleanse the wound with wound cleanser prior to applying a clean dressing using gauze sponges, not tissue or cotton balls. Peri-Wound Care Sween Lotion (Moisturizing lotion) Discharge Instruction: Apply moisturizing lotion as directed Topical DARLETTA, NOBLETT I ([id number]) [id number] Page 8 of 8 Mupirocin Ointment Discharge Instruction: Apply Mupirocin (Bactroban) as instructed Primary Dressing Hydrofera Blue Ready Transfer Foam, 2.5x2.5 (in/in) Discharge Instruction: Apply directly to wound bed as directed Secondary Dressing Woven Gauze Sponge, Non-Sterile 4x4 in Discharge Instruction: Apply over primary dressing as directed. Secured With Compression Wrap ThreePress (3 layer compression wrap) Discharge Instruction: Apply three layer compression , or equivalent 2 layer wrap Compression Stockings Add-Ons Electronic Signature(s) Signed: 02/11/2022 4:57:00 PM By: Baruch Gouty RN, BSN Entered By: Baruch Gouty on 02/11/2022 10:45:38 -------------------------------------------------------------------------------- Sharon Details Patient Name: Date of Service: April Austin I. 02/11/2022 10:30 A M Medical Record Number: [id number] Patient Account Number:  [id number] Date of Birth/Sex: Treating RN: 12-01-1945 (77 y.o. F) Primary Care Nader Boys: Domingo Mend Other Clinician: Referring Camisha Srey: Treating Jerrine Urschel/Extender: [name] in Treatment: 20 Vital Signs Time Taken: 10:25 Temperature (F): 97.6 Height (in): 60 Pulse (bpm): 91 Weight (lbs): 101 Respiratory Rate (breaths/min): 20 Body Mass Index (BMI): 19.7 Blood Pressure (mmHg): 178/77 Reference Range: 80 - 120 mg / dl Electronic Signature(s) Signed: 02/11/2022 4:57:00 PM By: Baruch Gouty RN, BSN Entered By: Baruch Gouty on 02/11/2022 10:43:35

## 2022-02-18 ENCOUNTER — Encounter (HOSPITAL_BASED_OUTPATIENT_CLINIC_OR_DEPARTMENT_OTHER): Payer: Medicare Other | Admitting: General Surgery

## 2022-02-18 DIAGNOSIS — L97812 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d: Secondary | ICD-10-CM | POA: Diagnosis not present

## 2022-02-19 NOTE — Progress Notes (Signed)
ETHLYN, BLOOME I ([id number]) [id number] Page 1 of 8 Visit Report for 02/18/2022 Arrival Information Details Patient Name: Date of Service: JANETZY, BARUT 02/18/2022 10:30 A M Medical Record Number: [id number] Patient Account Number: [id number] Date of Birth/Sex: Treating RN: November 12, 1945 (77 y.o. [name], [name] Primary Care Hanne Kegg: Domingo Mend Other Clinician: Referring Ramelo Oetken: Treating Kadajah Kjos/Extender: Caprice Kluver in Treatment: 21 Visit Information History Since Last Visit Added or deleted any medications: No Patient Arrived: Ambulatory Any new allergies or adverse reactions: No Arrival Time: 10:28 Had a fall or experienced change in No Accompanied By: spouse activities of daily living that may affect Transfer Assistance: None risk of falls: Patient Identification Verified: Yes Signs or symptoms of abuse/neglect since last visito No Secondary Verification Process Completed: Yes Hospitalized since last visit: No Patient Requires Transmission-Based Precautions: No Implantable device outside of the clinic excluding No Patient Has Alerts: No cellular tissue based products placed in the center since last visit: Has Dressing in Place as Prescribed: Yes Has Compression in Place as Prescribed: Yes Pain Present Now: No Electronic Signature(s) Signed: 02/18/2022 5:22:41 PM By: Baruch Gouty RN, BSN Entered By: Baruch Gouty on 02/18/2022 10:29:48 -------------------------------------------------------------------------------- Compression Therapy Details Patient Name: Date of Service: [name] 02/18/2022 10:30 A M Medical Record Number: [id number] Patient Account Number: [id number] Date of Birth/Sex: Treating RN: 1945/09/15 (77 y.o. [name] Primary Care Tiernan Millikin: Domingo Mend Other Clinician: Referring Giada Schoppe: Treating Gredmarie Delange/Extender: Gevena Cotton Weeks  in Treatment: 21 Compression Therapy Performed for Wound Assessment: Wound #2 Right,Lateral Lower Leg Performed By: Clinician Baruch Gouty, RN Compression Type: Three Layer Post Procedure Diagnosis Same as Pre-procedure Electronic Signature(s) Signed: 02/18/2022 5:22:41 PM By: Baruch Gouty RN, BSN Entered By: Baruch Gouty on 02/18/2022 10:51:50 [place], [place] Ranger ([id number]) [id number] Page 2 of 8 -------------------------------------------------------------------------------- Encounter Discharge Information Details Patient Name: Date of Service: NORHAN, TANZILLO 02/18/2022 10:30 A M Medical Record Number: [id number] Patient Account Number: [id number] Date of Birth/Sex: Treating RN: [birth year] (77 y.o. [name] Primary Care Tanner Vigna: Domingo Mend Other Clinician: Referring Camrin Lapre: Treating Damari Suastegui/Extender: Gevena Cotton Weeks in Treatment: 21 Encounter Discharge Information Items Post Procedure Vitals Discharge Condition: Stable Temperature (F): 97.9 Ambulatory Status: Ambulatory Pulse (bpm): 90 Discharge Destination: Home Respiratory Rate (breaths/min): 18 Transportation: Private Auto Blood Pressure (mmHg): 156/73 Accompanied By: spouse Schedule Follow-up Appointment: Yes Clinical Summary of Care: Patient Declined Electronic Signature(s) Signed: 02/18/2022 5:22:41 PM By: Baruch Gouty RN, BSN Entered By: Baruch Gouty on 02/18/2022 11:11:27 -------------------------------------------------------------------------------- Lower Extremity Assessment Details Patient Name: Date of Service: [name] 02/18/2022 10:30 A M Medical Record Number: [id number] Patient Account Number: [id number] Date of Birth/Sex: Treating RN: 05-Feb-1945 (77 y.o. [name] Primary Care Kashden Deboy: Domingo Mend Other Clinician: Referring Mehkai Gallo: Treating Lakota Schweppe/Extender: Gevena Cotton Weeks in Treatment: 21 Edema Assessment Assessed: [Left: No] [Right: No] Edema: [Left: N] [Right: o] Calf Left: Right: Point of Measurement: From Medial Instep 26 cm Ankle Left: Right: Point of Measurement: From Medial Instep 17.8 cm Vascular Assessment Pulses: Dorsalis Pedis Palpable: [Right:Yes] Electronic Signature(s) Signed: 02/18/2022 5:22:41 PM By: Baruch Gouty RN, BSN Entered By: Baruch Gouty on 02/18/2022 10:35:05 Multi Wound Chart Details -------------------------------------------------------------------------------- [name] ([id number]) [id number] Page 3 of 8 Patient Name: Date of Service: CYNDY, ACQUISTO I. 02/18/2022 10:30 A M Medical Record Number: [id number] Patient Account Number: [id number] Date of Birth/Sex:  Treating RN: 1945/02/20 (77 y.o. F) Primary Care Aedyn Kempfer: Domingo Mend Other Clinician: Referring Jamea Robicheaux: Treating Farzad Tibbetts/Extender: Gevena Cotton Weeks in Treatment: 21 Vital Signs Height(in): 60 Pulse(bpm): 90 Weight(lbs): 101 Blood Pressure(mmHg): 156/73 Body Mass Index(BMI): 19.7 Temperature(F): 97.9 Respiratory Rate(breaths/min): 18 Wound Assessments Wound Number: 2 N/A N/A Photos: N/A N/A Right, Lateral Lower Leg N/A N/A Wound Location: Surgical Injury N/A N/A Wounding Event: Open Surgical Wound N/A N/A Primary Etiology: Venous Leg Ulcer N/A N/A Secondary Etiology: Cataracts, Chronic sinus N/A N/A Comorbid History: problems/congestion, Hypertension, Crohns, Osteoarthritis 08/17/2021 N/A N/A Date Acquired: 21 N/A N/A Weeks of Treatment: Open N/A N/A Wound Status: No N/A N/A Wound Recurrence: Yes N/A N/A Clustered Wound: 0.2x0.2x0.1 N/A N/A Measurements L x W x D (cm) 0.031 N/A N/A A (cm) : rea 0.003 N/A N/A Volume (cm) : 99.80% N/A N/A % Reduction in A rea: 99.80% N/A N/A % Reduction in Volume: Full Thickness Without Exposed N/A  N/A Classification: Support Structures Small N/A N/A Exudate A mount: Serosanguineous N/A N/A Exudate Type: red, brown N/A N/A Exudate Color: Flat and Intact N/A N/A Wound Margin: None Present (0%) N/A N/A Granulation A mount: Large (67-100%) N/A N/A Necrotic A mount: Fat Layer (Subcutaneous Tissue): Yes N/A N/A Exposed Structures: Fascia: No Tendon: No Muscle: No Joint: No Bone: No Large (67-100%) N/A N/A Epithelialization: Debridement - Selective/Open Wound N/A N/A Debridement: Pre-procedure Verification/Time Out 10:50 N/A N/A Taken: Lidocaine 4% Topical Solution N/A N/A Pain Control: Slough N/A N/A Tissue Debrided: Non-Viable Tissue N/A N/A Level: 0.04 N/A N/A Debridement A (sq cm): rea Curette, Forceps, Scissors N/A N/A Instrument: Minimum N/A N/A Bleeding: Pressure N/A N/A Hemostasis A chieved: 0 N/A N/A Procedural Pain: 0 N/A N/A Post Procedural Pain: Procedure was tolerated well N/A N/A Debridement Treatment Response: 0.2x0.2x0.1 N/A N/A Post Debridement Measurements L x W x D (cm) 0.003 N/A N/A Post Debridement Volume: (cm) Excoriation: No N/A N/A Periwound Skin Texture: Induration: No Callus: No Crepitus: No Rash: No Scarring: No Dry/Scaly: No N/A N/A Periwound Skin Moisture: Atrophie Blanche: No N/A N/A Periwound Skin Color: Cyanosis: No DEMIA, RINGE I ([id number]) [id number] Page 4 of 8 Ecchymosis: No Erythema: No Hemosiderin Staining: No Mottled: No Pallor: No Rubor: No No Abnormality N/A N/A Temperature: Compression Therapy N/A N/A Procedures Performed: Debridement Treatment Notes Electronic Signature(s) Signed: 02/18/2022 11:04:23 AM By: [name] MD FACS Entered By: [name] on 02/18/2022 11:04:22 -------------------------------------------------------------------------------- Multi-Disciplinary Care Plan Details Patient Name: Date of Service: [name] 02/18/2022  10:30 A M Medical Record Number: [id number] Patient Account Number: [id number] Date of Birth/Sex: Treating RN: 11-08-1945 (77 y.o. [name] Primary Care Kortni Hasten: Domingo Mend Other Clinician: Referring Marguis Mathieson: Treating Gabrianna Fassnacht/Extender: Caprice Kluver in Treatment: 21 Multidisciplinary Care Plan reviewed with physician Active Inactive Abuse / Safety / Falls / Self Care Management Nursing Diagnoses: History of Falls Potential for falls Goals: Patient will not experience any injury related to falls Date Initiated: 12/07/2021 Target Resolution Date: 02/25/2022 Goal Status: Active Patient/caregiver will verbalize/demonstrate measures taken to prevent injury and/or falls Date Initiated: 09/20/2021 Date Inactivated: 10/19/2021 Target Resolution Date: 10/18/2021 Goal Status: Met Interventions: Assess fall risk on admission and as needed Assess impairment of mobility on admission and as needed per policy Notes: Pressure Nursing Diagnoses: Knowledge deficit related to causes and risk factors for pressure ulcer development Goals: Patient will remain free of pressure ulcers Date Initiated: 12/07/2021 Target Resolution Date: 02/25/2022 Goal Status: Active Interventions: Assess  potential for pressure ulcer upon admission and as needed Notes: Electronic Signature(s) Signed: 02/18/2022 5:22:41 PM By: Baruch Gouty RN, BSN [place], [place] I ([id number]) [id number] Page 5 of 8 Entered By: Baruch Gouty on 02/18/2022 10:40:54 -------------------------------------------------------------------------------- Pain Assessment Details Patient Name: Date of Service: MICAELA, AIRINGTON 02/18/2022 10:30 A M Medical Record Number: [id number] Patient Account Number: [id number] Date of Birth/Sex: Treating RN: [birth year] (77 y.o. [name] Primary Care Tashia Leiterman: Domingo Mend Other Clinician: Referring  Willem Klingensmith: Treating Kip Cropp/Extender: Gevena Cotton Weeks in Treatment: 21 Active Problems Location of Pain Severity and Description of Pain Patient Has Paino No Site Locations Rate the pain. Current Pain Level: 0 Pain Management and Medication Current Pain Management: Electronic Signature(s) Signed: 02/18/2022 5:22:41 PM By: Baruch Gouty RN, BSN Entered By: Baruch Gouty on 02/18/2022 10:31:51 -------------------------------------------------------------------------------- Patient/Caregiver Education Details Patient Name: Date of Service: [name] 2/12/2024andnbsp10:30 [place] Record Number: [id number] Patient Account Number: [id number] Date of Birth/Gender: Treating RN: 31-Jan-1945 (77 y.o. [name] Primary Care Physician: Domingo Mend Other Clinician: Referring Physician: Treating Physician/Extender: Caprice Kluver in Treatment: 21 Education Assessment Education Provided To: Patient Education Topics Provided VenousNIAMH, PINELA I ([id number]) [id number] Page 6 of 8 Methods: Explain/Verbal Responses: Reinforcements needed, State content correctly Electronic Signature(s) Signed: 02/18/2022 5:22:41 PM By: Baruch Gouty RN, BSN Entered By: Baruch Gouty on 02/18/2022 10:41:10 -------------------------------------------------------------------------------- Wound Assessment Details Patient Name: Date of Service: [name] 02/18/2022 10:30 A M Medical Record Number: [id number] Patient Account Number: [id number] Date of Birth/Sex: Treating RN: 1945/07/26 (77 y.o. [name], [name] Primary Care Zurie Platas: Domingo Mend Other Clinician: Referring Katia Hannen: Treating Florence Yeung/Extender: Gevena Cotton Weeks in Treatment: 21 Wound Status Wound Number: 2 Primary Open Surgical Wound Etiology: Wound Location: Right, Lateral Lower  Leg Secondary Venous Leg Ulcer Wounding Event: Surgical Injury Etiology: Date Acquired: 08/17/2021 Wound Status: Open Weeks Of Treatment: 21 Comorbid Cataracts, Chronic sinus problems/congestion, Hypertension, Clustered Wound: Yes History: Crohns, Osteoarthritis Photos Wound Measurements Length: (cm) 0.2 Width: (cm) 0.2 Depth: (cm) 0.1 Area: (cm) 0.031 Volume: (cm) 0.003 % Reduction in Area: 99.8% % Reduction in Volume: 99.8% Epithelialization: Large (67-100%) Tunneling: No Undermining: No Wound Description Classification: Full Thickness Without Exposed Support Wound Margin: Flat and Intact Exudate Amount: Small Exudate Type: Serosanguineous Exudate Color: red, brown Structures Foul Odor After Cleansing: No Slough/Fibrino Yes Wound Bed Granulation Amount: None Present (0%) Exposed Structure Necrotic Amount: Large (67-100%) Fascia Exposed: No Necrotic Quality: Adherent Slough Fat Layer (Subcutaneous Tissue) Exposed: Yes Tendon Exposed: No Muscle Exposed: No Joint Exposed: No Bone Exposed: No Periwound Skin Texture Texture Color Hackel, [place] I ([id number]) [id number] Page 7 of 8 No Abnormalities Noted: Yes No Abnormalities Noted: Yes Moisture Temperature / Pain No Abnormalities Noted: Yes Temperature: No Abnormality Treatment Notes Wound #2 (Lower Leg) Wound Laterality: Right, Lateral Cleanser Soap and Water Discharge Instruction: May shower and wash wound with dial antibacterial soap and water prior to dressing change. Wound Cleanser Discharge Instruction: Cleanse the wound with wound cleanser prior to applying a clean dressing using gauze sponges, not tissue or cotton balls. Peri-Wound Care Sween Lotion (Moisturizing lotion) Discharge Instruction: Apply moisturizing lotion as directed Topical Mupirocin Ointment Discharge Instruction: Apply Mupirocin (Bactroban) as instructed Primary Dressing Hydrofera Blue Ready Transfer Foam,  2.5x2.5 (in/in) Discharge Instruction: Apply directly to wound bed as directed Santyl Ointment Discharge Instruction: Apply nickel thick amount to wound bed as instructed Secondary  Dressing Woven Gauze Sponge, Non-Sterile 4x4 in Discharge Instruction: Apply over primary dressing as directed. Secured With Compression Wrap ThreePress (3 layer compression wrap) Discharge Instruction: Apply three layer compression , or equivalent 2 layer wrap Compression Stockings Add-Ons Electronic Signature(s) Signed: 02/18/2022 5:22:41 PM By: Baruch Gouty RN, BSN Entered By: Baruch Gouty on 02/18/2022 10:39:50 -------------------------------------------------------------------------------- Vitals Details Patient Name: Date of Service: [name] 02/18/2022 10:30 A M Medical Record Number: [id number] Patient Account Number: [id number] Date of Birth/Sex: Treating RN: 1945-06-13 (77 y.o. [name] Primary Care Tiffanni Scarfo: Domingo Mend Other Clinician: Referring Kimberlyn Quiocho: Treating Sybil Shrader/Extender: Gevena Cotton Weeks in Treatment: 21 Vital Signs Time Taken: 10:30 Temperature (F): 97.9 Height (in): 60 Pulse (bpm): 90 Weight (lbs): 101 Respiratory Rate (breaths/min): 18 Body Mass Index (BMI): 19.7 Blood Pressure (mmHg): 156/73 Reference Range: 80 - 120 mg / dl Electronic Signature(s) Signed: 02/18/2022 5:22:41 PM By: Baruch Gouty RN, BSN Zambrana, Golden 02/18/2022 5:22:41 PM By: Baruch Gouty RN, BSN Signed: I ([id number]) [id number] Page 8 of 8 Entered By: Baruch Gouty on 02/18/2022 10:31:43

## 2022-02-19 NOTE — Progress Notes (Signed)
April, [name] [name] ([id number]) [id number] Page 1 of 11 Visit Report for 02/18/2022 Chief Complaint Document Details Patient Name: Date of Service: [name], [name] 02/18/2022 10:30 A M Medical Record Number: [id number] Patient Account Number: [id number] Date of Birth/Sex: Treating RN: 01-16-1945 (77 y.o. F) Primary Care Provider: Domingo Mend Other Clinician: Referring Provider: Treating Provider/Extender: Caprice Kluver in Treatment: 21 Information Obtained from: Patient Chief Complaint 11/15/2020; patient is here for review of a wound area on the right posterior calf 09/20/2021: Patient is here for a nonhealing wound after undergoing Mohs surgery on her right lower leg. 12/07/2021: Reopening of skin at site of previous wound, new wound on right toe, concerning lesion on left upper leg just above knee Electronic Signature(s) Signed: 02/18/2022 11:04:30 AM By: [name] MD FACS Entered By: [name] on 02/18/2022 11:04:29 -------------------------------------------------------------------------------- Debridement Details Patient Name: Date of Service: [name]. 02/18/2022 10:30 A M Medical Record Number: [id number] Patient Account Number: [id number] Date of Birth/Sex: Treating RN: January 23, 1945 (77 y.o. [name], [name] Primary Care Provider: Domingo Mend Other Clinician: Referring Provider: Treating Provider/Extender: Gevena Cotton Weeks in Treatment: 21 Debridement Performed for Assessment: Wound #2 Right,Lateral Lower Leg Performed By: Physician [name], MD Debridement Type: Debridement Severity of Tissue Pre Debridement: Fat layer exposed Level of Consciousness (Pre-procedure): Awake and Alert Pre-procedure Verification/Time Out Yes - 10:50 Taken: Start Time: 10:53 Pain Control: Lidocaine 4% T opical Solution T Area Debrided (L x W): otal 0.2 (cm) x 0.2 (cm) =  0.04 (cm) Tissue and other material debrided: Non-Viable, Slough, Slough Level: Non-Viable Tissue Debridement Description: Selective/Open Wound Instrument: Curette, Forceps, Scissors Bleeding: Minimum Hemostasis Achieved: Pressure Procedural Pain: 0 Post Procedural Pain: 0 Response April Treatment: Procedure was tolerated well Level of Consciousness (Post- Awake and Alert procedure): Post Debridement Measurements of Total Wound Length: (cm) 0.2 Width: (cm) 0.2 Depth: (cm) 0.1 Volume: (cm) 0.003 Mantz, Leitha [name] ([id number]) [id number] Page 2 of 11 Character of Wound/Ulcer Post Debridement: Improved Severity of Tissue Post Debridement: Fat layer exposed Post Procedure Diagnosis Same as Pre-procedure Notes scribed by Baruch Gouty, RN for Dr. [name] Electronic Signature(s) Signed: 02/18/2022 11:23:53 AM By: [name] MD FACS Signed: 02/18/2022 5:22:41 PM By: Baruch Gouty RN, BSN Entered By: Baruch Gouty on 02/18/2022 10:56:00 -------------------------------------------------------------------------------- HPI Details Patient Name: Date of Service: [name]. 02/18/2022 10:30 A M Medical Record Number: [id number] Patient Account Number: [id number] Date of Birth/Sex: Treating RN: 1945/12/18 (77 y.o. F) Primary Care Provider: Domingo Mend Other Clinician: Referring Provider: Treating Provider/Extender: Gevena Cotton Weeks in Treatment: 21 History of Present Illness HPI Description: ADMISSION 11/15/2020 This is a 77 year old woman with a history of squamous cell cancer skin cancer followed by Dr. Anabel Bene dermatology. She describes having a wound on the right posterior calf dating back since September. She saw her primary doctor in early October who suggested petroleum infused gauze. The patient has been doing this. No real improvement in fact she says this is getting larger. She does not complain much of  pain. She is completely uncertain about how this happened there was no trauma she simply became aware of it at some point describing it is about the size of a quarter but it has been getting progressively larger since then. She is here for review of this. Past medical history Crohn's disease not currently on prednisone, gastroesophageal reflux disease, history of squamous cell skin cancer, history of  rheumatoid arthritis, hypertension 11/16; this lady had a large superficial atypical wound on the right posterior calf. She has significant solar skin damage probably some degree of venous insufficiency but she described a rapidly expanding wound area. Because of this and a history of skin cancer [name] went ahead and did a shave biopsy but we do not have the results of this at the time we saw her in clinic today. She has been using silver alginate and kerlix 11/30; shave biopsy [name] did last time did not show any malignancy or fungal elements. Suggestion of a stasis ulcer. With our treatment which is simply been silver alginate with kerlix that she is changing daily the wound is down dramatically almost surprising how rapidly it is reduced in surface area 12/14; patient comes in today with her wound healed this was on the right posterior calf. She has significant solar skin damage probably some degree of venous insufficiency however she described a rapidly expanding wound area on admission. Because of this and a history of skin cancer [name] did do a shave biopsy but nothing was really found of worry. She tells me that she had biopsies and her gastroenterologist is now saying she has "colitis" rather than Crohn's disease and she is now on steroids orally READMISSION 09/20/2021 She returns April clinic today having undergone a Mohs procedure on her right anterior tibial surface on August 17, 2021. Unfortunately, the wound has failed April heal. There is thick slough on the wound surface. The periwound skin is in reasonably  good condition. There is granulation tissue forming underneath the slough. 09/28/2021: The wound has deteriorated rather significantly over the past week. She had fairly copious drainage that she noticed as early as Monday, but she did not contact our office. Today there is a lot of periwound skin breakdown and a very pungent odor coming from the wound. There is thick slough on the wound surface but there is still good granulation tissue underlying this. Despite this, however, the wound actually measures smaller. 10/05/2021: The culture that [name] took grew out MRSA. [name] prescribed doxycycline and she is still taking this. There has been a tremendous improvement in the wound. It is smaller and has contracted considerably. There is hypertrophic granulation tissue present. No malodorous drainage. 10/16/2021: The wound has contracted further and is epithelializing. The surface is very clean and the hypertrophic granulation tissue has not recurred. She completed her course of doxycycline. 10/19; surgical wound on the right medial lower extremity secondary April Mohs surgery done in August. She is using Hydrofera Blue under 3 layer compression and making excellent progress. 11/02/2021: The wound is nearly completely epithelialized. Just a couple of open areas with some eschar overlying the surface. 11/12/2021: Just 1 small open area remains. It is clean without any slough or eschar accumulation. No hypertrophic granulation tissue present. 11/19/2021: Her wound is healed. READMISSION 12/07/2021 [name], [name] ([id number]) [id number] Page 3 of 11 The patient has been moving for the past couple of weeks and has spent a substantial amount of time on her feet. As result, she developed leg swelling and the tissue where her wound had been, which she was protecting with a Band-Aid, broke down considerably. She now has a large wound that is irregular with extensive slough, involving the fat  layer. She also developed a blister on her toe that has slough and eschar present. She also pointed out lump just above her left knee that she is concerned about, as she does have a history of  multiple skin cancers. 12/11; patient comes in the clinic with a large wound on the right anterior lower leg. This was initially a skin cancer underwent Mohs surgery and was treated in this clinic and healed out on 11/13. The patient states the wound reopened with edema and also trauma from a dressing she was putting on it. 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 She also has a wound on the right second toe. The patient states quite adamantly she will not go back April her previous dermatologist for the skin surgery center in [place] blaming them for the original wound on her right lateral leg. [name] am not exactly sure of her rationale but at this point she certainly will not consider a referral there 12/18; the patient has an appointment at [facility] on 12/28 although [name] am not sure exactly who she is seeing. This is in reference April the skin cancer just above her left patella. She would not go back April the skin surgery or indeed her dermatologist in [place]. She had an area on the right second toe this is healed today. Her original surgical site on the right anterior lateral lower leg actually looks some better. [name] changed her April Iodoflex last week 01/01/2022: The patient has an appointment with plastic surgery on Thursday April evaluate the biopsy-proven squamous cell carcinoma on her left upper leg. The wound on her right lower leg has a thick layer of slough on the surface and has a very atypical appearance. 01/08/2022: The biopsies that [name] took last week from the wound on her right lower extremity were negative for malignancy but did demonstrate some bacteria along with inflammation. She met with the plastic surgeon on Thursday and she is going April undergo wide local excision  of the biopsy-proven squamous cell carcinoma on her left upper leg. The right lower leg wound is narrower again this week. There is slough and eschar as well as some fibrinous debris in the areas where [name] took the biopsies and obtained hemostasis with silver nitrate. 01/14/2022: The wound is narrower today, but is beginning April accumulate hypertrophic granulation tissue. Thin layer of slough on the surface. There is still slough and fibrinous debris in the 2 biopsy sites. 01/21/2022: The wound continues April contract. There is still slough and nonviable subcutaneous tissue in both biopsy sites. 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 01/28/2022: About half the wound has epithelialized. The remaining open portion has hypertrophic granulation tissue present. 02/04/2022: The wound is smaller by about a centimeter. There is some slough accumulation in the old biopsy site. The rest of the wound is clean. 02/11/2022: The wound is down April just about a centimeter. There is still some slough accumulation at the old biopsy site. 02/18/2022: The only remaining open area is a tiny hole from the biopsy site. There is slough accumulation. Electronic Signature(s) Signed: 02/18/2022 11:05:02 AM By: [name] MD FACS Entered By: [name] on 02/18/2022 11:05:02 -------------------------------------------------------------------------------- Physical Exam Details Patient Name: Date of Service: [name]. 02/18/2022 10:30 A M Medical Record Number: [id number] Patient Account Number: [id number] Date of Birth/Sex: Treating RN: [birth year] (77 y.o. F) Primary Care Provider: Domingo Mend Other Clinician: Referring Provider: Treating Provider/Extender: Gevena Cotton Weeks in Treatment: 21 Constitutional Hypertensive,  asymptomatic. . . .  no acute distress. Respiratory Normal work of breathing on room air. Notes 02/18/2022: The only remaining open area is a tiny hole from the biopsy site. There is slough accumulation. Electronic Signature(s) Signed: 02/18/2022 11:05:31 AM By: [name] MD FACS Entered By: [name] on 02/18/2022 11:05:31 [name], [name] ([id number]) [id number] Page 4 of 11 -------------------------------------------------------------------------------- Physician Orders Details Patient Name: Date of Service: ARDICE, MADRID 02/18/2022 10:30 A M Medical Record Number: [id number] Patient Account Number: [id number] Date of Birth/Sex: Treating RN: Dec 29, 1945 (77 y.o. [name], [name] Primary Care Provider: Domingo Mend Other Clinician: Referring Provider: Treating Provider/Extender: Caprice Kluver in Treatment: 21 Verbal / Phone Orders: No Diagnosis Coding ICD-10 Coding Code Description [phone] 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d T81.31XS Disruption of external operation (surgical) wound, not elsewhere classified, sequela L98.9 Disorder of the skin and subcutaneous tissue, unspecified C44.722 Squamous cell carcinoma of skin of right lower limb, including hip I10 Essential (primary) hypertension K50.90 Crohn's disease, unspecified, without complications [id number] Unspecified severe protein-calorie malnutrition Follow-up Appointments ppointment in 1 week. - Dr. [name] Rm 1 Return A Monday 2/19 @ 1030 am Anesthetic (In clinic) Topical Lidocaine 4% applied April wound bed Bathing/ Shower/ Hygiene May shower with protection but do not get wound dressing(s) wet. Protect dressing(s) with water repellant cover (for example, large plastic bag) or a cast cover and may then take shower. Edema Control - Lymphedema / SCD / Other Avoid standing for long periods of  time. Moisturize legs daily. Wound Treatment Wound #2 - Lower Leg Wound Laterality: Right, Lateral Cleanser: Soap and Water 1 x Per Week/30 Days Discharge Instructions: May shower and wash wound with dial antibacterial soap and water prior April dressing change. Cleanser: Wound Cleanser 1 x Per Week/30 Days Discharge Instructions: Cleanse the wound with wound cleanser prior April applying a clean dressing using gauze sponges, not tissue or cotton balls. Peri-Wound Care: Sween Lotion (Moisturizing lotion) 1 x Per Week/30 Days Discharge Instructions: Apply moisturizing lotion as directed Topical: Mupirocin Ointment 1 x Per Week/30 Days Discharge Instructions: Apply Mupirocin (Bactroban) as instructed Prim Dressing: Hydrofera Blue Ready Transfer Foam, 2.5x2.5 (in/in) 1 x Per Week/30 Days ary Discharge Instructions: Apply directly April wound bed as directed Prim Dressing: Santyl Ointment 1 x Per Week/30 Days ary Discharge Instructions: Apply nickel thick amount April wound bed as instructed Secondary Dressing: Woven Gauze Sponge, Non-Sterile 4x4 in 1 x Per Week/30 Days Discharge Instructions: Apply over primary dressing as directed. Compression Wrap: ThreePress (3 layer compression wrap) 1 x Per Week/30 Days Discharge Instructions: Apply three layer compression , or equivalent 2 layer wrap Electronic Signature(s) Signed: 02/18/2022 11:23:53 AM By: [name] MD FACS Entered By: [name] on 02/18/2022 11:05:42 Nez, [name] ([id number]) [id number] Page 5 of 11 -------------------------------------------------------------------------------- Problem List Details Patient Name: Date of Service: [name], [name] 02/18/2022 10:30 A M Medical Record Number: [id number] Patient Account Number: [id number] Date of Birth/Sex: Treating RN: 1945-03-21 (77 y.o. [name] Primary Care Provider: Domingo Mend Other Clinician: Referring Provider: Treating  Provider/Extender: Gevena Cotton Weeks in Treatment: 21 Active Problems ICD-10 Encounter Code Description Active Date MDM Diagnosis L97.812 Non-pressure chronic ulcer of other part of right lower leg with fat layer 09/20/2021 No Yes exposed T81.31XS Disruption of external operation (surgical) wound, not elsewhere classified, 09/20/2021 No Yes sequela L98.9 Disorder of the skin and subcutaneous tissue, unspecified 12/07/2021 No Yes C44.722  Squamous cell carcinoma of skin of right lower limb, including hip 09/20/2021 No Yes I10 Essential (primary) hypertension 09/20/2021 No Yes K50.90 Crohn's disease, unspecified, without complications [id number] No Yes E43 Unspecified severe protein-calorie malnutrition 09/20/2021 No Yes Inactive Problems ICD-10 Code Description Active Date Inactive Date L97.512 Non-pressure chronic ulcer of other part of right foot with fat layer exposed 12/07/2021 12/07/2021 Resolved Problems Electronic Signature(s) Signed: 02/18/2022 11:04:15 AM By: [name] MD FACS Entered By: [name] on 02/18/2022 11:04:15 Hanf, [name] ([id number]) [id number] Page 6 of 11 -------------------------------------------------------------------------------- Progress Note Details Patient Name: Date of Service: [name], [name]. 02/18/2022 10:30 A M Medical Record Number: [id number] Patient Account Number: [id number] Date of Birth/Sex: Treating RN: 1945/08/25 (77 y.o. F) Primary Care Provider: Domingo Mend Other Clinician: Referring Provider: Treating Provider/Extender: Caprice Kluver in Treatment: 21 Subjective Chief Complaint Information obtained from Patient 11/15/2020; patient is here for review of a wound area on the right posterior calf 09/20/2021: Patient is here for a nonhealing wound after undergoing Mohs surgery on her right lower leg. 12/07/2021: Reopening of skin at site of  previous wound, new wound on right toe, concerning lesion on left upper leg just above knee History of Present Illness (HPI) ADMISSION 11/15/2020 This is a 77 year old woman with a history of squamous cell cancer skin cancer followed by Dr. Anabel Bene dermatology. She describes having a wound on the right posterior calf dating back since September. She saw her primary doctor in early October who suggested petroleum infused gauze. The patient has been doing this. No real improvement in fact she says this is getting larger. She does not complain much of pain. She is completely uncertain about how this happened there was no trauma she simply became aware of it at some point describing it is about the size of a quarter but it has been getting progressively larger since then. She is here for review of this. Past medical history Crohn's disease not currently on prednisone, gastroesophageal reflux disease, history of squamous cell skin cancer, history of rheumatoid arthritis, hypertension 11/16; this lady had a large superficial atypical wound on the right posterior calf. She has significant solar skin damage probably some degree of venous insufficiency but she described a rapidly expanding wound area. Because of this and a history of skin cancer [name] went ahead and did a shave biopsy but we do not have the results of this at the time we saw her in clinic today. She has been using silver alginate and kerlix 11/30; shave biopsy [name] did last time did not show any malignancy or fungal elements. Suggestion of a stasis ulcer. With our treatment which is simply been silver alginate with kerlix that she is changing daily the wound is down dramatically almost surprising how rapidly it is reduced in surface area 12/14; patient comes in today with her wound healed this was on the right posterior calf. She has significant solar skin damage probably some degree of venous insufficiency however she described a rapidly expanding  wound area on admission. Because of this and a history of skin cancer [name] did do a shave biopsy but nothing was really found of worry. She tells me that she had biopsies and her gastroenterologist is now saying she has "colitis" rather than Crohn's disease and she is now on steroids orally READMISSION 09/20/2021 She returns April clinic today having undergone a Mohs procedure on her right anterior tibial surface on August 17, 2021. Unfortunately, the wound has failed  April heal. There is thick slough on the wound surface. The periwound skin is in reasonably good condition. There is granulation tissue forming underneath the slough. 09/28/2021: The wound has deteriorated rather significantly over the past week. She had fairly copious drainage that she noticed as early as Monday, but she did not contact our office. Today there is a lot of periwound skin breakdown and a very pungent odor coming from the wound. There is thick slough on the wound surface but there is still good granulation tissue underlying this. Despite this, however, the wound actually measures smaller. 10/05/2021: The culture that [name] took grew out MRSA. [name] prescribed doxycycline and she is still taking this. There has been a tremendous improvement in the wound. It is smaller and has contracted considerably. There is hypertrophic granulation tissue present. No malodorous drainage. 10/16/2021: The wound has contracted further and is epithelializing. The surface is very clean and the hypertrophic granulation tissue has not recurred. She completed her course of doxycycline. 10/19; surgical wound on the right medial lower extremity secondary April Mohs surgery done in August. She is using Hydrofera Blue under 3 layer compression and making excellent progress. 11/02/2021: The wound is nearly completely epithelialized. Just a couple of open areas with some eschar overlying the surface. 11/12/2021: Just 1 small open area remains. It is clean without any slough  or eschar accumulation. No hypertrophic granulation tissue present. 11/19/2021: Her wound is healed. READMISSION 12/07/2021 The patient has been moving for the past couple of weeks and has spent a substantial amount of time on her feet. As result, she developed leg swelling and the tissue where her wound had been, which she was protecting with a Band-Aid, broke down considerably. She now has a large wound that is irregular with extensive slough, involving the fat layer. She also developed a blister on her toe that has slough and eschar present. She also pointed out lump just above her left knee that she is concerned about, as she does have a history of multiple skin cancers. 12/11; patient comes in the clinic with a large wound on the right anterior lower leg. This was initially a skin cancer underwent Mohs surgery and was treated in this clinic and healed out on 11/13. The patient states the wound reopened with edema and also trauma from a dressing she was putting on it. 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 She also has a wound on the right second toe. The patient states quite adamantly she will not go back April her previous dermatologist for the skin surgery center in [place] blaming them for the original wound on her right lateral leg. [name] am not exactly sure of her rationale but at this point she certainly will not consider a referral there [name], [name] ([id number]) [id number] Page 7 of 11 12/18; the patient has an appointment at [facility] on 12/28 although [name] am not sure exactly who she is seeing. This is in reference April the skin cancer just above her left patella. She would not go back April the skin surgery or indeed her dermatologist in [place]. She had an area on the right second toe this is healed today. Her original surgical site on the right anterior lateral lower leg actually looks some better. [name] changed her April  Iodoflex last week 01/01/2022: The patient has an appointment with plastic surgery on Thursday April evaluate the biopsy-proven squamous cell carcinoma on her left upper leg. The wound on her right lower  leg has a thick layer of slough on the surface and has a very atypical appearance. 01/08/2022: The biopsies that [name] took last week from the wound on her right lower extremity were negative for malignancy but did demonstrate some bacteria along with inflammation. She met with the plastic surgeon on Thursday and she is going April undergo wide local excision of the biopsy-proven squamous cell carcinoma on her left upper leg. The right lower leg wound is narrower again this week. There is slough and eschar as well as some fibrinous debris in the areas where [name] took the biopsies and obtained hemostasis with silver nitrate. 01/14/2022: The wound is narrower today, but is beginning April accumulate hypertrophic granulation tissue. Thin layer of slough on the surface. There is still slough and fibrinous debris in the 2 biopsy sites. 01/21/2022: The wound continues April contract. There is still slough and nonviable subcutaneous tissue in both biopsy sites. 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 01/28/2022: About half the wound has epithelialized. The remaining open portion has hypertrophic granulation tissue present. 02/04/2022: The wound is smaller by about a centimeter. There is some slough accumulation in the old biopsy site. The rest of the wound is clean. 02/11/2022: The wound is down April just about a centimeter. There is still some slough accumulation at the old biopsy site. 02/18/2022: The only remaining open area is a tiny hole from the biopsy site. There is slough accumulation. Patient History Information obtained from Patient. Family History Diabetes - Father,  Heart Disease - Father, Hypertension - Father, No family history of Cancer, Hereditary Spherocytosis, Kidney Disease, Lung Disease, Seizures, Stroke, Thyroid Problems, Tuberculosis. Social History Former smoker - quit 30 years ago, Marital Status - Married, Alcohol Use - Never, Drug Use - No History, Caffeine Use - Rarely. Medical History Eyes Patient has history of Cataracts - Had cataract surgery Denies history of Glaucoma, Optic Neuritis Ear/Nose/Mouth/Throat Patient has history of Chronic sinus problems/congestion - Post Nasal drip Denies history of Middle ear problems Hematologic/Lymphatic Denies history of Anemia, Hemophilia, Human Immunodeficiency Virus, Lymphedema, Sickle Cell Disease Respiratory Denies history of Aspiration, Asthma, Chronic Obstructive Pulmonary Disease (COPD), Pneumothorax, Sleep Apnea, Tuberculosis Cardiovascular Patient has history of Hypertension Gastrointestinal Patient has history of Crohnoos - Has Chrons Disease Denies history of Cirrhosis , Colitis, Hepatitis A, Hepatitis B, Hepatitis C Endocrine Denies history of Type [name] Diabetes, Type II Diabetes Genitourinary Denies history of End Stage Renal Disease Immunological Denies history of Lupus Erythematosus, Raynaudoos, Scleroderma Musculoskeletal Patient has history of Osteoarthritis - Knees Denies history of Gout, Rheumatoid Arthritis, Osteomyelitis Neurologic Denies history of Dementia, Neuropathy, Quadriplegia, Paraplegia, Seizure Disorder Oncologic Denies history of Received Chemotherapy, Received Radiation Psychiatric Denies history of Anorexia/bulimia, Confinement Anxiety Hospitalization/Surgery History - bil TKA. - Moh's procedure right leg 08/17/21. - bil cataract extractions. - nasal fx surgery. Medical A Surgical History Notes nd Gastrointestinal Has a large hiatal hernia. Has Chrons Disease., GERD, pancreatic insufficiency Endocrine Pancreatic Insufficiency Genitourinary stage 3  Chronic Kidney disease Integumentary (Skin) Had Squamous Cell (skin cancer). Had a skin graft on Right knee (area) Musculoskeletal Had Bil knee replacements, hx cervical spine fx, sternal fx, thoracis spine fx Neurologic left foot drop Oncologic Had Squamous Cell (skin cell) [name], [name] ([id number]) [id number] Page 8 of 11 Objective Constitutional Hypertensive, asymptomatic. no acute distress. Vitals Time Taken: 10:30 AM,  Height: 60 in, Weight: 101 lbs, BMI: 19.7, Temperature: 97.9 F, Pulse: 90 bpm, Respiratory Rate: 18 breaths/min, Blood Pressure: 156/73 mmHg. Respiratory Normal work of breathing on room air. General Notes: 02/18/2022: The only remaining open area is a tiny hole from the biopsy site. There is slough accumulation. Integumentary (Hair, Skin) Wound #2 status is Open. Original cause of wound was Surgical Injury. The date acquired was: 08/17/2021. The wound has been in treatment 21 weeks. The wound is located on the Right,Lateral Lower Leg. The wound measures 0.2cm length x 0.2cm width x 0.1cm depth; 0.031cm^2 area and 0.003cm^3 volume. There is Fat Layer (Subcutaneous Tissue) exposed. There is no tunneling or undermining noted. There is a small amount of serosanguineous drainage noted. The wound margin is flat and intact. There is no granulation within the wound bed. There is a large (67-100%) amount of necrotic tissue within the wound bed including Adherent Slough. The periwound skin appearance had no abnormalities noted for texture. The periwound skin appearance had no abnormalities noted for moisture. The periwound skin appearance had no abnormalities noted for color. Periwound temperature was noted as No Abnormality. 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 There was a Selective/Open Wound Non-Viable Tissue Debridement with a total area of 0.04 sq cm performed by [name], MD. With the following instrument(s): Curette, Forceps, and Scissors April remove Non-Viable tissue/material. Material removed includes [facility] after achieving pain control using Lidocaine 4% T opical Solution. No specimens were taken. A time out was conducted at 10:50, prior April the start of the procedure. A Minimum amount of bleeding was controlled with Pressure. The procedure was tolerated well with a pain level of 0 throughout and a pain level of 0 following the procedure. Post Debridement Measurements: 0.2cm length x 0.2cm width x 0.1cm depth; 0.003cm^3 volume. Character of Wound/Ulcer Post Debridement is improved. Severity of Tissue Post Debridement is: Fat layer exposed. Post procedure Diagnosis Wound #2: Same as Pre-Procedure General Notes: scribed by Baruch Gouty, RN for Dr. [name]. Pre-procedure diagnosis of Wound #2 is an Open Surgical Wound located on the Right,Lateral Lower Leg . There was a Three Layer Compression Therapy Procedure by Baruch Gouty, RN. Post procedure Diagnosis Wound #2: Same as Pre-Procedure Plan Follow-up Appointments: Return Appointment in 1 week. - Dr. [name] Rm 1 Monday 2/19 @ 1030 am Anesthetic: (In clinic) Topical Lidocaine 4% applied April wound bed Bathing/ Shower/ Hygiene: May shower with protection but do not get wound dressing(s) wet. Protect dressing(s) with water repellant cover (for example, large plastic bag) or a  cast cover and may then take shower. Edema Control - Lymphedema / SCD / Other: Avoid standing for long periods of time. Moisturize legs daily. WOUND #2: - Lower Leg Wound Laterality: Right, Lateral Cleanser: Soap and Water 1 x Per Week/30 Days [name], [name] ([id number]) [id number] Page 9 of 11 Discharge Instructions: May shower and wash wound with dial antibacterial soap and water prior April dressing change. Cleanser: Wound Cleanser 1 x Per Week/30 Days Discharge Instructions: Cleanse the wound with  wound cleanser prior April applying a clean dressing using gauze sponges, not tissue or cotton balls. Peri-Wound Care: Sween Lotion (Moisturizing lotion) 1 x Per Week/30 Days Discharge Instructions: Apply moisturizing lotion as directed Topical: Mupirocin Ointment 1 x Per Week/30 Days Discharge Instructions: Apply Mupirocin (Bactroban) as instructed Prim Dressing: Hydrofera Blue Ready Transfer Foam, 2.5x2.5 (in/in) 1 x Per Week/30 Days ary Discharge Instructions: Apply directly April wound bed as directed Prim Dressing: Santyl Ointment 1 x Per Week/30 Days ary Discharge Instructions: Apply nickel thick amount April wound bed as instructed Secondary Dressing: Woven Gauze Sponge, Non-Sterile 4x4 in 1 x Per Week/30 Days Discharge Instructions: Apply over primary dressing as directed. Com pression Wrap: ThreePress (3 layer compression wrap) 1 x Per Week/30 Days Discharge Instructions: Apply three layer compression , or equivalent 2 layer wrap 02/18/2022: The only remaining open area is a tiny hole from the biopsy site. There is slough accumulation. [name] used a combination of curette, scissors, and forceps April try and remove the slough. It was quite densely adherent and only a portion of it was able April be debrided. We will add some Santyl April the topical mupirocin we are using April try and enzymatically break this down. Continue Hydrofera Blue and 3 layer compression. Follow-up in 1  week. Electronic Signature(s) Signed: 02/18/2022 11:06:47 AM By: [name] MD FACS Entered By: [name] on 02/18/2022 11:06:47 -------------------------------------------------------------------------------- HxROS Details Patient Name: Date of Service: [name]. 02/18/2022 10:30 A M Medical Record Number: [id number] Patient Account Number: [id number] Date of Birth/Sex: Treating RN: 03-Nov-1945 (77 y.o. F) Primary Care Provider: Domingo Mend Other Clinician: Referring Provider: Treating Provider/Extender: Caprice Kluver in Treatment: 21 Information Obtained From Patient Eyes Medical History: Positive for: Cataracts - Had cataract surgery Negative for: Glaucoma; Optic Neuritis Ear/Nose/Mouth/Throat Medical History: Positive for: Chronic sinus problems/congestion - Post Nasal drip Negative for: Middle ear problems Hematologic/Lymphatic Medical History: Negative for: Anemia; Hemophilia; Human Immunodeficiency Virus; Lymphedema; Sickle Cell Disease Respiratory Medical History: Negative for: Aspiration; Asthma; Chronic Obstructive Pulmonary Disease (COPD); Pneumothorax; Sleep Apnea; Tuberculosis Cardiovascular Medical History: Positive for: Hypertension Gastrointestinal [name], [name] ([id number]) [id number] Page 10 of 11 Medical History: Positive for: Crohns - Has Chrons Disease Negative for: Cirrhosis ; Colitis; Hepatitis A; Hepatitis B; Hepatitis C Past Medical History Notes: Has a large hiatal hernia. Has Chrons Disease., GERD, pancreatic insufficiency Endocrine Medical History: Negative for: Type [name] Diabetes; Type II Diabetes Past Medical History Notes: Pancreatic Insufficiency Genitourinary Medical History: Negative for: End Stage Renal Disease Past Medical History Notes: stage 3 Chronic Kidney disease Immunological Medical History: Negative for: Lupus Erythematosus; Raynauds;  Scleroderma Integumentary (Skin) Medical History: Past Medical History Notes: Had Squamous Cell (skin cancer). Had a skin graft on Right knee (area) Musculoskeletal Medical History: Positive for: Osteoarthritis - Knees Negative for: Gout; Rheumatoid Arthritis; Osteomyelitis Past Medical History Notes: Had Bil knee replacements, hx cervical spine fx, sternal fx, thoracis spine fx Neurologic Medical History: Negative for: Dementia; Neuropathy; Quadriplegia; Paraplegia; Seizure Disorder Past Medical History Notes: left foot drop Oncologic Medical History: Negative for: Received Chemotherapy; Received Radiation Past Medical History Notes: Had Squamous Cell (skin cell) Psychiatric Medical History: Negative for: Anorexia/bulimia; Confinement Anxiety HBO Extended History Items Ear/Nose/Mouth/Throat: Eyes: Chronic sinus Cataracts problems/congestion Immunizations Pneumococcal Vaccine: Received Pneumococcal Vaccination: Yes Received Pneumococcal Vaccination On or After 60th Birthday: Yes Tetanus Vaccine: Last tetanus shot: 11/16/2011 Implantable Devices None Hospitalization / Surgery History Type of Hospitalization/Surgery bil TKA Moh's procedure right leg 08/17/21  bil cataract extractions [name], [name] ([id number]) [phone].pdf Page 11 of 11 nasal fx surgery Family and Social History Cancer: No; Diabetes: Yes - Father; Heart Disease: Yes - Father; Hereditary Spherocytosis: No; Hypertension: Yes - Father; Kidney Disease: No; Lung Disease: No; Seizures: No; Stroke: No; Thyroid Problems: No; Tuberculosis: No; Former smoker - quit 30 years ago; Marital Status - Married; Alcohol Use: Never; Drug Use: No History; Caffeine Use: Rarely; Financial Concerns: No; Food, Clothing or Shelter Needs: No; Support System Lacking: No; Transportation Concerns: No Electronic Signature(s) Signed: 02/18/2022 11:23:53 AM By: [name] MD FACS Entered By: [name] on 02/18/2022 11:05:09 -------------------------------------------------------------------------------- SuperBill Details Patient Name: Date of Service: [name]. 02/18/2022 Medical Record Number: [id number] Patient Account Number: [id number] Date of Birth/Sex: Treating RN: [birth year] (77 y.o. 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, including hip I10 Essential (primary) hypertension K50.90 Crohn's disease, unspecified, without complications [id number] Unspecified severe protein-calorie malnutrition Facility Procedures : CPT4 Code: [id number] Description: [id number] - DEBRIDE WOUND 1ST 20 SQ CM OR < ICD-10 Diagnosis Description L97.812 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 Modifier: d Quantity: 1 Physician Procedures : CPT4 Code Description Modifier [id number] - WC PHYS LEVEL 4 - EST PT 25 ICD-10 Diagnosis Description L97.812 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d T81.31XS Disruption of external operation (surgical)  wound, not elsewhere classified, sequela L98.9 Disorder of the skin and subcutaneous tissue, unspecified E43 Unspecified severe protein-calorie malnutrition Quantity: 1 : [id number] - WC PHYS DEBR WO ANESTH 20 SQ CM ICD-10 Diagnosis Description L97.812 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d Quantity: 1 Electronic Signature(s) Signed: 02/18/2022 11:07:05 AM  By: [name] MD FACS Entered By: [name] on 02/18/2022 11:07:05

## 2022-02-25 ENCOUNTER — Encounter (HOSPITAL_BASED_OUTPATIENT_CLINIC_OR_DEPARTMENT_OTHER): Payer: Medicare Other | Admitting: General Surgery

## 2022-02-25 DIAGNOSIS — L97812 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d: Secondary | ICD-10-CM | POA: Diagnosis not present

## 2022-02-26 NOTE — Progress Notes (Signed)
JAZMINNE, MECHLING I ([id number]) [id number] Page 1 of 11 Visit Report for 02/25/2022 Chief Complaint Document Details Patient Name: Date of Service: [name], [name] 02/25/2022 10:30 A M Medical Record Number: [id number] Patient Account Number: [id number] Date of Birth/Sex: Treating RN: Nov 08, 1945 (77 y.o. F) Primary Care Provider: Domingo Mend Other Clinician: Referring Provider: Treating Provider/Extender: Caprice Kluver in Treatment: 22 Information Obtained from: Patient Chief Complaint 11/15/2020; patient is here for review of a wound area on the right posterior calf 09/20/2021: Patient is here for a nonhealing wound after undergoing Mohs surgery on her right lower leg. 12/07/2021: Reopening of skin at site of previous wound, new wound on right toe, concerning lesion on left upper leg just above knee Electronic Signature(s) Signed: 02/25/2022 10:57:23 AM By: [name] MD FACS Entered By: [name] on 02/25/2022 10:57:23 -------------------------------------------------------------------------------- Debridement Details Patient Name: Date of Service: [name] I. 02/25/2022 10:30 A M Medical Record Number: [id number] Patient Account Number: [id number] Date of Birth/Sex: Treating RN: 1946-01-05 (77 y.o. [name], [name] Primary Care Provider: Domingo Mend Other Clinician: Referring Provider: Treating Provider/Extender: Gevena Cotton Weeks in Treatment: 22 Debridement Performed for Assessment: Wound #2 Right,Lateral Lower Leg Performed By: Physician [name], MD Debridement Type: Debridement Severity of Tissue Pre Debridement: Fat layer exposed Level of Consciousness (Pre-procedure): Awake and Alert Pre-procedure Verification/Time Out Yes - 10:35 Taken: Start Time: 10:38 Pain Control: Lidocaine 4% T opical Solution T Area Debrided (L x W): otal 0.2 (cm) x 0.2 (cm) =  0.04 (cm) Tissue and other material debrided: Non-Viable, Slough, Slough Level: Non-Viable Tissue Debridement Description: Selective/Open Wound Instrument: Forceps Bleeding: Minimum Hemostasis Achieved: Pressure Procedural Pain: 0 Post Procedural Pain: 0 Response to Treatment: Procedure was tolerated well Level of Consciousness (Post- Awake and Alert procedure): Post Debridement Measurements of Total Wound Length: (cm) 0.2 Width: (cm) 0.2 Depth: (cm) 0.1 Volume: (cm) 0.003 Closson, Adie I ([id number]) [id number] Page 2 of 11 Character of Wound/Ulcer Post Debridement: Improved Severity of Tissue Post Debridement: Fat layer exposed Post Procedure Diagnosis Same as Pre-procedure Notes scribed by Baruch Gouty, RN for Dr. [name] Electronic Signature(s) Signed: 02/25/2022 11:21:45 AM By: [name] MD FACS Signed: 02/25/2022 5:38:46 PM By: Baruch Gouty RN, BSN Entered By: Baruch Gouty on 02/25/2022 10:42:14 -------------------------------------------------------------------------------- HPI Details Patient Name: Date of Service: [name] I. 02/25/2022 10:30 A M Medical Record Number: [id number] Patient Account Number: [id number] Date of Birth/Sex: Treating RN: Mar 28, 1945 (77 y.o. F) Primary Care Provider: Domingo Mend Other Clinician: Referring Provider: Treating Provider/Extender: Gevena Cotton Weeks in Treatment: 22 History of Present Illness HPI Description: ADMISSION 11/15/2020 This is a 77 year old woman with a history of squamous cell cancer skin cancer followed by Dr. Anabel Bene dermatology. She describes having a wound on the right posterior calf dating back since September. She saw her primary doctor in early October who suggested petroleum infused gauze. The patient has been doing this. No real improvement in fact she says this is getting larger. She does not complain much of pain. She is  completely uncertain about how this happened there was no trauma she simply became aware of it at some point describing it is about the size of a quarter but it has been getting progressively larger since then. She is here for review of this. Past medical history Crohn's disease not currently on prednisone, gastroesophageal reflux disease, history of squamous cell skin cancer, history of rheumatoid arthritis,  hypertension 11/16; this lady had a large superficial atypical wound on the right posterior calf. She has significant solar skin damage probably some degree of venous insufficiency but she described a rapidly expanding wound area. Because of this and a history of skin cancer I went ahead and did a shave biopsy but we do not have the results of this at the time we saw her in clinic today. She has been using silver alginate and kerlix 11/30; shave biopsy I did last time did not show any malignancy or fungal elements. Suggestion of a stasis ulcer. With our treatment which is simply been silver alginate with kerlix that she is changing daily the wound is down dramatically almost surprising how rapidly it is reduced in surface area 12/14; patient comes in today with her wound healed this was on the right posterior calf. She has significant solar skin damage probably some degree of venous insufficiency however she described a rapidly expanding wound area on admission. Because of this and a history of skin cancer I did do a shave biopsy but nothing was really found of worry. 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 Unfortunately, the wound has failed to heal. There is thick slough on the wound surface. The periwound skin is in reasonably good  condition. There is granulation tissue forming underneath the slough. 09/28/2021: The wound has deteriorated rather significantly over the past week. She had fairly copious drainage that she noticed as early as Monday, but she did not contact our office. Today there is a lot of periwound skin breakdown and a very pungent odor coming from the wound. There is thick slough on the wound surface but there is still good granulation tissue underlying this. Despite this, however, the wound actually measures smaller. 10/05/2021: The culture that I took grew out MRSA. I prescribed doxycycline and she is still taking this. There has been a tremendous improvement in the wound. It is smaller and has contracted considerably. There is hypertrophic granulation tissue present. No malodorous drainage. 10/16/2021: The wound has contracted further and is epithelializing. The surface is very clean and the hypertrophic granulation tissue has not recurred. She completed her course of doxycycline. 10/19; surgical wound on the right medial lower extremity secondary to Mohs surgery done in August. She is using Hydrofera Blue under 3 layer compression and making excellent progress. 11/02/2021: The wound is nearly completely epithelialized. Just a couple of open areas with some eschar overlying the surface. 11/12/2021: Just 1 small open area remains. It is clean without any slough or eschar accumulation. No hypertrophic granulation tissue present. 11/19/2021: Her wound is healed. READMISSION 12/07/2021 ENSLEE, ECHAVARRIA I ([id number]) [id number] Page 3 of 11 The patient has been moving for the past couple of weeks and has spent a substantial amount of time on her feet. As result, she developed leg swelling and the tissue where her wound had been, which she was protecting with a Band-Aid, broke down considerably. She now has a large wound that is irregular with extensive slough, involving the fat layer. She  also developed a blister on her toe that has slough and eschar present. She also pointed out lump just above her left knee that she is concerned about, as she does have a history of multiple skin  cancers. 12/11; patient comes in the clinic with a large wound on the right anterior lower leg. This was initially a skin cancer underwent Mohs surgery and was treated in this clinic and healed out on 11/13. The patient states the wound reopened with edema and also trauma from a dressing she was putting on it. 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 She also has a wound on the right second toe. The patient states quite adamantly she will not go back to her previous dermatologist for the skin surgery center in [place] blaming them for the original wound on her right lateral leg. I am not exactly sure of her rationale but at this point she certainly will not consider a referral there 12/18; the patient has an appointment at [facility] on 12/28 although I am not sure exactly who she is seeing. This is in reference to the skin cancer just above her left patella. She would not go back to the skin surgery or indeed her dermatologist in [place]. She had an area on the right second toe this is healed today. Her original surgical site on the right anterior lateral lower leg actually looks some better. I changed her to Iodoflex last week 01/01/2022: The patient has an appointment with plastic surgery on Thursday to evaluate the biopsy-proven squamous cell carcinoma on her left upper leg. The wound on her right lower leg has a thick layer of slough on the surface and has a very atypical appearance. 01/08/2022: The biopsies that I took last week from the wound on her right lower extremity were negative for malignancy but did demonstrate some bacteria along with inflammation. She met with the plastic surgeon on Thursday and she is going to undergo wide local excision of the  biopsy-proven squamous cell carcinoma on her left upper leg. The right lower leg wound is narrower again this week. There is slough and eschar as well as some fibrinous debris in the areas where I took the biopsies and obtained hemostasis with silver nitrate. 01/14/2022: The wound is narrower today, but is beginning to accumulate hypertrophic granulation tissue. Thin layer of slough on the surface. There is still slough and fibrinous debris in the 2 biopsy sites. 01/21/2022: The wound continues to contract. There is still slough and nonviable subcutaneous tissue in both biopsy sites. 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 01/28/2022: About half the wound has epithelialized. The remaining open portion has hypertrophic granulation tissue present. 02/04/2022: The wound is smaller by about a centimeter. There is some slough accumulation in the old biopsy site. The rest of the wound is clean. 02/11/2022: The wound is down to just about a centimeter. There is still some slough accumulation at the old biopsy site. 02/18/2022: The only remaining open area is a tiny hole from the biopsy site. There is slough accumulation. 02/25/2022: The biopsy site hole remains open. There is fibrinous exudate present. Electronic Signature(s) Signed: 02/25/2022 10:57:55 AM By: [name] MD FACS Entered By: [name] on 02/25/2022 10:57:54 -------------------------------------------------------------------------------- Physical Exam Details Patient Name: Date of Service: [name] I. 02/25/2022 10:30 A M Medical Record Number: [id number] Patient Account Number: [id number] Date of Birth/Sex: Treating RN: [birth year] (77 y.o. F) Primary Care Provider: Domingo Mend Other Clinician: Referring Provider: Treating Provider/Extender: Kerry Fort ,  Estela Weeks in Treatment: 22 Constitutional Hypertensive, asymptomatic. . . . no acute distress. Respiratory Normal work of breathing on room air. Notes 02/25/2022: The biopsy site hole remains open. There is fibrinous exudate present. Electronic Signature(s) Signed: 02/25/2022 10:58:26 AM By: [name] MD FACS Entered By: [name] on 02/25/2022 10:58:26 Larner, Parks Ranger ([id number]) [id number] Page 4 of 11 -------------------------------------------------------------------------------- Physician Orders Details Patient Name: Date of Service: JENESSA, SIELER 02/25/2022 10:30 A M Medical Record Number: [id number] Patient Account Number: [id number] Date of Birth/Sex: Treating RN: [birth year] (77 y.o. [name], [name] Primary Care Provider: Domingo Mend Other Clinician: Referring Provider: Treating Provider/Extender: Gevena Cotton Weeks in Treatment: 22 Verbal / Phone Orders: No Diagnosis Coding ICD-10 Coding Code Description [phone] 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d T81.31XS Disruption of external operation (surgical) wound, not elsewhere classified, sequela L98.9 Disorder of the skin and subcutaneous tissue, unspecified C44.722 Squamous cell carcinoma of skin of right lower limb, including hip I10 Essential (primary) hypertension K50.90 Crohn's disease, unspecified, without complications [id number] Unspecified severe protein-calorie malnutrition Follow-up Appointments ppointment in 1 week. - Dr. [name] Rm 1 Return A Monday 2/26 @ 1030 am Anesthetic (In clinic) Topical Lidocaine 4% applied to wound bed Bathing/ Shower/ Hygiene May shower with protection but do not get wound dressing(s) wet. Protect dressing(s) with water repellant cover (for example, large plastic bag) or a cast cover and may then take shower. Edema Control - Lymphedema / SCD /  Other Avoid standing for long periods of time. Moisturize legs daily. Wound Treatment Wound #2 - Lower Leg Wound Laterality: Right, Lateral Cleanser: Soap and Water 1 x Per Week/30 Days Discharge Instructions: May shower and wash wound with dial antibacterial soap and water prior to dressing change. Cleanser: Wound Cleanser 1 x Per Week/30 Days Discharge Instructions: Cleanse the wound with wound cleanser prior to applying a clean dressing using gauze sponges, not tissue or cotton balls. Peri-Wound Care: Sween Lotion (Moisturizing lotion) 1 x Per Week/30 Days Discharge Instructions: Apply moisturizing lotion as directed Topical: Mupirocin Ointment 1 x Per Week/30 Days Discharge Instructions: Apply Mupirocin (Bactroban) as instructed Prim Dressing: Hydrofera Blue Ready Transfer Foam, 2.5x2.5 (in/in) 1 x Per Week/30 Days ary Discharge Instructions: Apply directly to wound bed as directed Prim Dressing: Santyl Ointment 1 x Per Week/30 Days ary Discharge Instructions: Apply nickel thick amount to wound bed as instructed Secondary Dressing: Woven Gauze Sponge, Non-Sterile 4x4 in 1 x Per Week/30 Days Discharge Instructions: Apply over primary dressing as directed. Compression Wrap: ThreePress (3 layer compression wrap) 1 x Per Week/30 Days Discharge Instructions: Apply three layer compression , or equivalent 2 layer wrap Electronic Signature(s) Signed: 02/25/2022 11:21:45 AM By: [name] MD FACS Entered By: [name] on 02/25/2022 10:59:10 Ribble, Parks Ranger ([id number]) [id number] Page 5 of 11 -------------------------------------------------------------------------------- Problem List Details Patient Name: Date of Service: ANGELLINA, RUPNOW I. 02/25/2022 10:30 A M Medical Record Number: [id number] Patient Account Number: [id number] Date of Birth/Sex: Treating RN: 1945/08/31 (77 y.o. [name] Primary Care Provider: Domingo Mend Other  Clinician: Referring Provider: Treating Provider/Extender: Gevena Cotton Weeks in Treatment: 22 Active Problems ICD-10 Encounter Code Description Active Date MDM Diagnosis L97.812 Non-pressure chronic ulcer of other part of right lower leg with fat layer 09/20/2021 No Yes exposed T81.31XS Disruption of external operation (surgical) wound, not elsewhere classified, 09/20/2021 No Yes sequela L98.9 Disorder of the skin and subcutaneous tissue,  unspecified 12/07/2021 No Yes C44.722 Squamous cell carcinoma of skin of right lower limb, including hip 09/20/2021 No Yes I10 Essential (primary) hypertension 09/20/2021 No Yes K50.90 Crohn's disease, unspecified, without complications [id number] No Yes E43 Unspecified severe protein-calorie malnutrition 09/20/2021 No Yes Inactive Problems ICD-10 Code Description Active Date Inactive Date L97.512 Non-pressure chronic ulcer of other part of right foot with fat layer exposed 12/07/2021 12/07/2021 Resolved Problems Electronic Signature(s) Signed: 02/25/2022 10:57:07 AM By: [name] MD FACS Entered By: [name] on 02/25/2022 10:57:07 Avey, Parks Ranger ([id number]) [id number] Page 6 of 11 -------------------------------------------------------------------------------- Progress Note Details Patient Name: Date of Service: [name], [name] I. 02/25/2022 10:30 A M Medical Record Number: [id number] Patient Account Number: [id number] Date of Birth/Sex: Treating RN: 21-May-1945 (77 y.o. F) Primary Care Provider: Domingo Mend Other Clinician: Referring Provider: Treating Provider/Extender: Caprice Kluver in Treatment: 22 Subjective Chief Complaint Information obtained from Patient 11/15/2020; patient is here for review of a wound area on the right posterior calf 09/20/2021: Patient is here for a nonhealing wound after undergoing Mohs surgery on her right lower leg.  12/07/2021: Reopening of skin at site of previous wound, new wound on right toe, concerning lesion on left upper leg just above knee History of Present Illness (HPI) ADMISSION 11/15/2020 This is a 77 year old woman with a history of squamous cell cancer skin cancer followed by Dr. Anabel Bene dermatology. She describes having a wound on the right posterior calf dating back since September. She saw her primary doctor in early October who suggested petroleum infused gauze. The patient has been doing this. No real improvement in fact she says this is getting larger. She does not complain much of pain. She is completely uncertain about how this happened there was no trauma she simply became aware of it at some point describing it is about the size of a quarter but it has been getting progressively larger since then. She is here for review of this. Past medical history Crohn's disease not currently on prednisone, gastroesophageal reflux disease, history of squamous cell skin cancer, history of rheumatoid arthritis, hypertension 11/16; this lady had a large superficial atypical wound on the right posterior calf. She has significant solar skin damage probably some degree of venous insufficiency but she described a rapidly expanding wound area. Because of this and a history of skin cancer I went ahead and did a shave biopsy but we do not have the results of this at the time we saw her in clinic today. She has been using silver alginate and kerlix 11/30; shave biopsy I did last time did not show any malignancy or fungal elements. Suggestion of a stasis ulcer. With our treatment which is simply been silver alginate with kerlix that she is changing daily the wound is down dramatically almost surprising how rapidly it is reduced in surface area 12/14; patient comes in today with her wound healed this was on the right posterior calf. She has significant solar skin damage probably some degree of venous insufficiency  however she described a rapidly expanding wound area on admission. Because of this and a history of skin cancer I did do a shave biopsy but nothing was really found of worry. 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  Unfortunately, the wound has failed to heal. There is thick slough on the wound surface. The periwound skin is in reasonably good condition. There is granulation tissue forming underneath the slough. 09/28/2021: The wound has deteriorated rather significantly over the past week. She had fairly copious drainage that she noticed as early as Monday, but she did not contact our office. Today there is a lot of periwound skin breakdown and a very pungent odor coming from the wound. There is thick slough on the wound surface but there is still good granulation tissue underlying this. Despite this, however, the wound actually measures smaller. 10/05/2021: The culture that I took grew out MRSA. I prescribed doxycycline and she is still taking this. There has been a tremendous improvement in the wound. It is smaller and has contracted considerably. There is hypertrophic granulation tissue present. No malodorous drainage. 10/16/2021: The wound has contracted further and is epithelializing. The surface is very clean and the hypertrophic granulation tissue has not recurred. She completed her course of doxycycline. 10/19; surgical wound on the right medial lower extremity secondary to Mohs surgery done in August. She is using Hydrofera Blue under 3 layer compression and making excellent progress. 11/02/2021: The wound is nearly completely epithelialized. Just a couple of open areas with some eschar overlying the surface. 11/12/2021: Just 1 small open  area remains. It is clean without any slough or eschar accumulation. No hypertrophic granulation tissue present. 11/19/2021: Her wound is healed. READMISSION 12/07/2021 The patient has been moving for the past couple of weeks and has spent a substantial amount of time on her feet. As result, she developed leg swelling and the tissue where her wound had been, which she was protecting with a Band-Aid, broke down considerably. She now has a large wound that is irregular with extensive slough, involving the fat layer. She also developed a blister on her toe that has slough and eschar present. She also pointed out lump just above her left knee that she is concerned about, as she does have a history of multiple skin cancers. 12/11; patient comes in the clinic with a large wound on the right anterior lower leg. This was initially a skin cancer underwent Mohs surgery and was treated in this clinic and healed out on 11/13. The patient states the wound reopened with edema and also trauma from a dressing she was putting on it. 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 She also has a wound on the right second toe. The patient states quite adamantly she will not go back to her previous dermatologist for the skin surgery center in [place] blaming them for the original wound on her right lateral leg. I am not exactly sure of her rationale but at this point she certainly will not consider a referral there YESLIN, COLFER I ([id number]) [id number] Page 7 of 11 12/18; the patient has an appointment at [facility] on 12/28 although I am not sure exactly who she is seeing. This is in reference to the skin cancer just above her left patella. She would not go back to the skin surgery or indeed her dermatologist in [place]. She had an area on the right second toe this is healed today. Her original surgical site on the right anterior lateral lower leg  actually looks some better. I changed her to Iodoflex last week 01/01/2022: The patient has an appointment with plastic surgery on Thursday to evaluate the biopsy-proven squamous cell carcinoma on her left upper leg. The  wound on her right lower leg has a thick layer of slough on the surface and has a very atypical appearance. 01/08/2022: The biopsies that I took last week from the wound on her right lower extremity were negative for malignancy but did demonstrate some bacteria along with inflammation. She met with the plastic surgeon on Thursday and she is going to undergo wide local excision of the biopsy-proven squamous cell carcinoma on her left upper leg. The right lower leg wound is narrower again this week. There is slough and eschar as well as some fibrinous debris in the areas where I took the biopsies and obtained hemostasis with silver nitrate. 01/14/2022: The wound is narrower today, but is beginning to accumulate hypertrophic granulation tissue. Thin layer of slough on the surface. There is still slough and fibrinous debris in the 2 biopsy sites. 01/21/2022: The wound continues to contract. There is still slough and nonviable subcutaneous tissue in both biopsy sites. 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 01/28/2022: About half the wound has epithelialized. The remaining open portion has hypertrophic granulation tissue present. 02/04/2022: The wound is smaller by about a centimeter. There is some slough accumulation in the old biopsy site. The rest of the wound is clean. 02/11/2022: The wound is down to just about a centimeter. There is still some slough accumulation at the old biopsy site. 02/18/2022: The only remaining open area is a tiny hole from the biopsy site. There is slough accumulation. 02/25/2022: The biopsy site hole remains open.  There is fibrinous exudate present. Patient History Information obtained from Patient. Family History Diabetes - Father, Heart Disease - Father, Hypertension - Father, No family history of Cancer, Hereditary Spherocytosis, Kidney Disease, Lung Disease, Seizures, Stroke, Thyroid Problems, Tuberculosis. Social History Former smoker - quit 30 years ago, Marital Status - Married, Alcohol Use - Never, Drug Use - No History, Caffeine Use - Rarely. Medical History Eyes Patient has history of Cataracts - Had cataract surgery Denies history of Glaucoma, Optic Neuritis Ear/Nose/Mouth/Throat Patient has history of Chronic sinus problems/congestion - Post Nasal drip Denies history of Middle ear problems Hematologic/Lymphatic Denies history of Anemia, Hemophilia, Human Immunodeficiency Virus, Lymphedema, Sickle Cell Disease Respiratory Denies history of Aspiration, Asthma, Chronic Obstructive Pulmonary Disease (COPD), Pneumothorax, Sleep Apnea, Tuberculosis Cardiovascular Patient has history of Hypertension Gastrointestinal Patient has history of Crohnoos - Has Chrons Disease Denies history of Cirrhosis , Colitis, Hepatitis A, Hepatitis B, Hepatitis C Endocrine Denies history of Type I Diabetes, Type II Diabetes Genitourinary Denies history of End Stage Renal Disease Immunological Denies history of Lupus Erythematosus, Raynaudoos, Scleroderma Musculoskeletal Patient has history of Osteoarthritis - Knees Denies history of Gout, Rheumatoid Arthritis, Osteomyelitis Neurologic Denies history of Dementia, Neuropathy, Quadriplegia, Paraplegia, Seizure Disorder Oncologic Denies history of Received Chemotherapy, Received Radiation Psychiatric Denies history of Anorexia/bulimia, Confinement Anxiety Hospitalization/Surgery History - bil TKA. - Moh's procedure right leg 08/17/21. - bil cataract extractions. - nasal fx surgery. Medical A Surgical History Notes nd Gastrointestinal Has a large  hiatal hernia. Has Chrons Disease., GERD, pancreatic insufficiency Endocrine Pancreatic Insufficiency Genitourinary stage 3 Chronic Kidney disease Integumentary (Skin) Had Squamous Cell (skin cancer). Had a skin graft on Right knee (area) Musculoskeletal Had Bil knee replacements, hx cervical spine fx, sternal fx, thoracis spine fx Neurologic left foot drop Oncologic Had Squamous Cell (skin cell) JERRICKA, SOWDERS I ([id number])  [id number] Page 8 of 11 Objective Constitutional Hypertensive, asymptomatic. no acute distress. Vitals Time Taken: 10:21 AM, Height: 60 in, Weight: 101 lbs, BMI: 19.7, Temperature: 97.6 F, Pulse: 86 bpm, Respiratory Rate: 20 breaths/min, Blood Pressure: 153/80 mmHg. Respiratory Normal work of breathing on room air. General Notes: 02/25/2022: The biopsy site hole remains open. There is fibrinous exudate present. Integumentary (Hair, Skin) Wound #2 status is Open. Original cause of wound was Surgical Injury. The date acquired was: 08/17/2021. The wound has been in treatment 22 weeks. The wound is located on the Right,Lateral Lower Leg. The wound measures 0.1cm length x 0.1cm width x 0.1cm depth; 0.008cm^2 area and 0.001cm^3 volume. There is Fat Layer (Subcutaneous Tissue) exposed. There is no tunneling or undermining noted. There is a none present amount of drainage noted. The wound margin is flat and intact. There is no granulation within the wound bed. There is a small (1-33%) amount of necrotic tissue within the wound bed including Adherent Slough. The periwound skin appearance had no abnormalities noted for texture. The periwound skin appearance had no abnormalities noted for moisture. The periwound skin appearance had no abnormalities noted for color. Periwound temperature was noted as No Abnormality. 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 There was a Selective/Open Wound Non-Viable Tissue Debridement with a total area of 0.04 sq cm performed by [name], MD. With the following instrument(s): Forceps to remove Non-Viable tissue/material. Material removed includes Slough after achieving pain control using Lidocaine 4% Topical Solution. No specimens were taken. A time out was conducted at 10:35, prior to the start of the procedure. A Minimum amount of bleeding was controlled with Pressure. The procedure was tolerated well with a pain level of 0 throughout and a pain level of 0 following the procedure. Post Debridement Measurements: 0.2cm length x 0.2cm width x 0.1cm depth; 0.003cm^3 volume. Character of Wound/Ulcer Post Debridement is improved. Severity of Tissue Post Debridement is: Fat layer exposed. Post procedure Diagnosis Wound #2: Same as Pre-Procedure General Notes: scribed by Baruch Gouty, RN for Dr. [name]. Pre-procedure diagnosis of Wound #2 is an Open Surgical Wound located on the Right,Lateral Lower Leg . There was a Three Layer Compression Therapy Procedure by Baruch Gouty, RN. Post procedure Diagnosis Wound #2: Same as Pre-Procedure Plan Follow-up Appointments: Return Appointment in 1 week. - Dr. [name] Rm 1 Monday 2/26 @ 1030 am Anesthetic: (In clinic) Topical Lidocaine 4% applied to wound bed Bathing/ Shower/ Hygiene: May shower with protection but do not get wound dressing(s) wet. Protect  dressing(s) with water repellant cover (for example, large plastic bag) or a cast cover and may then take shower. Edema Control - Lymphedema / SCD / Other: Avoid standing for long periods of time. Moisturize legs daily. NALLA, TAULBEE I ([id number]) [id number] Page 9 of 11 WOUND #2: - Lower Leg Wound Laterality: Right, Lateral Cleanser: Soap and Water 1 x Per Week/30 Days Discharge Instructions: May shower and wash wound with dial antibacterial soap and water prior to dressing change. Cleanser: Wound Cleanser 1 x Per Week/30  Days Discharge Instructions: Cleanse the wound with wound cleanser prior to applying a clean dressing using gauze sponges, not tissue or cotton balls. Peri-Wound Care: Sween Lotion (Moisturizing lotion) 1 x Per Week/30 Days Discharge Instructions: Apply moisturizing lotion as directed Topical: Mupirocin Ointment 1 x Per Week/30 Days Discharge Instructions: Apply Mupirocin (Bactroban) as instructed Prim Dressing: Hydrofera Blue Ready Transfer Foam, 2.5x2.5 (in/in) 1 x Per Week/30 Days ary Discharge Instructions: Apply directly to wound bed as directed Prim Dressing: Santyl Ointment 1 x Per Week/30 Days ary Discharge Instructions: Apply nickel thick amount to wound bed as instructed Secondary Dressing: Woven Gauze Sponge, Non-Sterile 4x4 in 1 x Per Week/30 Days Discharge Instructions: Apply over primary dressing as directed. Com pression Wrap: ThreePress (3 layer compression wrap) 1 x Per Week/30 Days Discharge Instructions: Apply three layer compression , or equivalent 2 layer wrap 02/25/2022: The biopsy site hole remains open. There is fibrinous exudate present. I used a pair of forceps to debride as much of the fibrinous exudate as I could. We will add some Santyl to the topical mupirocin and Hydrofera Blue to try and enzymatically break this down so that can be removed at her next visit. Continue 3 layer compression. Follow-up in 1  week. Electronic Signature(s) Signed: 02/25/2022 11:00:04 AM By: [name] MD FACS Entered By: [name] on 02/25/2022 11:00:04 -------------------------------------------------------------------------------- HxROS Details Patient Name: Date of Service: [name] I. 02/25/2022 10:30 A M Medical Record Number: [id number] Patient Account Number: [id number] Date of Birth/Sex: Treating RN: August 11, 1945 (77 y.o. F) Primary Care Provider: Domingo Mend Other Clinician: Referring Provider: Treating Provider/Extender: Caprice Kluver in Treatment: 32 Information Obtained From Patient Eyes Medical History: Positive for: Cataracts - Had cataract surgery Negative for: Glaucoma; Optic Neuritis Ear/Nose/Mouth/Throat Medical History: Positive for: Chronic sinus problems/congestion - Post Nasal drip Negative for: Middle ear problems Hematologic/Lymphatic Medical History: Negative for: Anemia; Hemophilia; Human Immunodeficiency Virus; Lymphedema; Sickle Cell Disease Respiratory Medical History: Negative for: Aspiration; Asthma; Chronic Obstructive Pulmonary Disease (COPD); Pneumothorax; Sleep Apnea; Tuberculosis Cardiovascular Medical History: Positive for: Hypertension Gastrointestinal BURKE, HEISE I ([id number]) [id number] Page 10 of 11 Medical History: Positive for: Crohns - Has Chrons Disease Negative for: Cirrhosis ; Colitis; Hepatitis A; Hepatitis B; Hepatitis C Past Medical History Notes: Has a large hiatal hernia. Has Chrons Disease., GERD, pancreatic insufficiency Endocrine Medical History: Negative for: Type I Diabetes; Type II Diabetes Past Medical History Notes: Pancreatic Insufficiency Genitourinary Medical History: Negative for: End Stage Renal Disease Past Medical History Notes: stage 3 Chronic Kidney disease Immunological Medical History: Negative for: Lupus Erythematosus; Raynauds;  Scleroderma Integumentary (Skin) Medical History: Past Medical History Notes: Had Squamous Cell (skin cancer). Had a skin graft on Right knee (area) Musculoskeletal Medical History: Positive for: Osteoarthritis - Knees Negative for: Gout; Rheumatoid Arthritis; Osteomyelitis Past Medical History Notes: Had Bil knee replacements, hx cervical spine fx, sternal fx, thoracis spine fx Neurologic Medical History: Negative for: Dementia; Neuropathy; Quadriplegia; Paraplegia; Seizure Disorder Past Medical History Notes: left foot drop Oncologic Medical History: Negative for: Received Chemotherapy; Received Radiation Past Medical History Notes: Had Squamous Cell (skin cell) Psychiatric Medical History: Negative for: Anorexia/bulimia; Confinement Anxiety HBO Extended History Items Ear/Nose/Mouth/Throat: Eyes: Chronic sinus Cataracts problems/congestion Immunizations Pneumococcal Vaccine: Received Pneumococcal Vaccination: Yes Received Pneumococcal Vaccination On or After 60th Birthday: Yes Tetanus Vaccine: Last tetanus shot: 11/16/2011 Implantable Devices None Hospitalization / Surgery History Type of Hospitalization/Surgery bil TKA Moh's procedure right leg 08/17/21 VERLEE, FAVARA I ([id number]) [id number] Page 11  of 11 bil cataract extractions nasal fx surgery Family and Social History Cancer: No; Diabetes: Yes - Father; Heart Disease: Yes - Father; Hereditary Spherocytosis: No; Hypertension: Yes - Father; Kidney Disease: No; Lung Disease: No; Seizures: No; Stroke: No; Thyroid Problems: No; Tuberculosis: No; Former smoker - quit 30 years ago; Marital Status - Married; Alcohol Use: Never; Drug Use: No History; Caffeine Use: Rarely; Financial Concerns: No; Food, Clothing or Shelter Needs: No; Support System Lacking: No; Transportation Concerns: No Electronic Signature(s) Signed: 02/25/2022 11:21:45 AM By: [name] MD FACS Entered By: [name] on 02/25/2022 10:58:01 -------------------------------------------------------------------------------- SuperBill Details Patient Name: Date of Service: [name] I. 02/25/2022 Medical Record Number: [id number] Patient Account Number: [id number] Date of Birth/Sex: Treating RN: 1945-04-30 (77 y.o. 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, including hip I10 Essential (primary) hypertension K50.90 Crohn's disease, unspecified, without complications [id number] Unspecified severe protein-calorie malnutrition Facility Procedures : CPT4 Code: [id number] Description: [id number] - DEBRIDE WOUND 1ST 20 SQ CM OR < ICD-10 Diagnosis Description L97.812 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 Modifier: d Quantity: 1 Physician Procedures : CPT4 Code Description Modifier [id number] - WC PHYS LEVEL 4 - EST PT 25 ICD-10 Diagnosis Description L97.812 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d T81.31XS Disruption of external operation (surgical)  wound, not elsewhere classified, sequela C44.722 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

## 2022-02-26 NOTE — Progress Notes (Signed)
KENISE, HIEGEL [name] ([id number]) [id number] Page 1 of 8 Visit Report for 02/25/2022 Arrival Information Details Patient Name: Date of Service: [name], [name]. 02/25/2022 10:30 A M Medical Record Number: [id number] Patient Account Number: [id number] Date of Birth/Sex: Treating RN: 16-Sep-1945 (77 y.o. F) Primary Care Tichina Koebel: Domingo Mend Other Clinician: Referring Palmyra Rogacki: Treating Sanford Lindblad/Extender: Caprice Kluver in Treatment: 33 Visit Information History Since Last Visit All ordered tests and consults were completed: No Patient Arrived: Ambulatory Added or deleted any medications: No Arrival Time: 10:21 Any new allergies or adverse reactions: No Accompanied By: husband Had a fall or experienced change in No Transfer Assistance: None activities of daily living that may affect Patient Identification Verified: Yes risk of falls: Secondary Verification Process Completed: Yes Signs or symptoms of abuse/neglect since last visito No Patient Requires Transmission-Based Precautions: No Hospitalized since last visit: No Patient Has Alerts: No Implantable device outside of the clinic excluding No cellular tissue based products placed in the center since last visit: Has Dressing in Place as Prescribed: Yes Has Compression in Place as Prescribed: Yes Pain Present Now: No Electronic Signature(s) Signed: 02/25/2022 5:38:46 PM By: Baruch Gouty RN, BSN Entered By: Baruch Gouty on 02/25/2022 10:27:42 -------------------------------------------------------------------------------- Compression Therapy Details Patient Name: Date of Service: [name]. 02/25/2022 10:30 A M Medical Record Number: [id number] Patient Account Number: [id number] Date of Birth/Sex: Treating RN: Dec 10, 1945 (77 y.o. [name] Primary Care Halee Glynn: Domingo Mend Other Clinician: Referring Atheena Spano: Treating Ebone Alcivar/Extender: Gevena Cotton Weeks in Treatment: 22 Compression Therapy Performed for Wound Assessment: Wound #2 Right,Lateral Lower Leg Performed By: Clinician Baruch Gouty, RN Compression Type: Three Layer Post Procedure Diagnosis Same as Pre-procedure Electronic Signature(s) Signed: 02/25/2022 5:38:46 PM By: Baruch Gouty RN, BSN Entered By: Baruch Gouty on 02/25/2022 10:38:58 [name] ([id number]) [id number] Page 2 of 8 -------------------------------------------------------------------------------- Encounter Discharge Information Details Patient Name: Date of Service: [name], [name] 02/25/2022 10:30 A M Medical Record Number: [id number] Patient Account Number: [id number] Date of Birth/Sex: Treating RN: 31-Oct-1945 (77 y.o. [name] Primary Care Zniyah Midkiff: Domingo Mend Other Clinician: Referring Jaxtin Raimondo: Treating Maclane Holloran/Extender: Gevena Cotton Weeks in Treatment: 22 Encounter Discharge Information Items Post Procedure Vitals Discharge Condition: Stable Temperature (F): 97.6 Ambulatory Status: Ambulatory Pulse (bpm): 86 Discharge Destination: Home Respiratory Rate (breaths/min): 18 Transportation: Private Auto Blood Pressure (mmHg): 153/80 Accompanied By: self Schedule Follow-up Appointment: Yes Clinical Summary of Care: Patient Declined Electronic Signature(s) Signed: 02/25/2022 5:38:46 PM By: Baruch Gouty RN, BSN Entered By: Baruch Gouty on 02/25/2022 10:59:39 -------------------------------------------------------------------------------- Lower Extremity Assessment Details Patient Name: Date of Service: [name]. 02/25/2022 10:30 A M Medical Record Number: [id number] Patient Account Number: [id number] Date of Birth/Sex: Treating RN: 18-Nov-1945 (77 y.o. [name] Primary Care Zyere Jiminez: Domingo Mend Other Clinician: Referring Atha Mcbain: Treating  Corinthia Helmers/Extender: Gevena Cotton Weeks in Treatment: 22 Edema Assessment Assessed: [Left: No] [Right: No] Edema: [Left: N] [Right: o] Calf Left: Right: Point of Measurement: From Medial Instep 27 cm Ankle Left: Right: Point of Measurement: From Medial Instep 18 cm Vascular Assessment Pulses: Dorsalis Pedis Palpable: [Right:Yes] Electronic Signature(s) Signed: 02/25/2022 5:38:46 PM By: Baruch Gouty RN, BSN Entered By: Baruch Gouty on 02/25/2022 10:30:47 [name] ([id number]) [id number] Page 3 of 8 -------------------------------------------------------------------------------- Multi Wound Chart Details Patient Name: Date of Service: [name], [name]. 02/25/2022 10:30 A M Medical Record Number: [id number] Patient  Account Number: [id number] Date of Birth/Sex: Treating RN: 23-Nov-1945 (77 y.o. F) Primary Care Genetta Fiero: Domingo Mend Other Clinician: Referring Leeandra Ellerson: Treating Codi Kertz/Extender: Gevena Cotton Weeks in Treatment: 22 Vital Signs Height(in): 60 Pulse(bpm): 86 Weight(lbs): 101 Blood Pressure(mmHg): 153/80 Body Mass Index(BMI): 19.7 Temperature(F): 97.6 Respiratory Rate(breaths/min): 20 [2:Photos:] [N/A:N/A] Right, Lateral Lower Leg N/A N/A Wound Location: Surgical Injury N/A N/A Wounding Event: Open Surgical Wound N/A N/A Primary Etiology: Venous Leg Ulcer N/A N/A Secondary Etiology: Cataracts, Chronic sinus N/A N/A Comorbid History: problems/congestion, Hypertension, Crohns, Osteoarthritis 08/17/2021 N/A N/A Date Acquired: 22 N/A N/A Weeks of Treatment: Open N/A N/A Wound Status: No N/A N/A Wound Recurrence: Yes N/A N/A Clustered Wound: 0.1x0.1x0.1 N/A N/A Measurements L x W x D (cm) 0.008 N/A N/A A (cm) : rea 0.001 N/A N/A Volume (cm) : 100.00% N/A N/A % Reduction in A rea: 99.90% N/A N/A % Reduction in Volume: Full Thickness Without Exposed N/A  N/A Classification: Support Structures None Present N/A N/A Exudate A mount: Flat and Intact N/A N/A Wound Margin: None Present (0%) N/A N/A Granulation A mount: Small (1-33%) N/A N/A Necrotic A mount: Fat Layer (Subcutaneous Tissue): Yes N/A N/A Exposed Structures: Fascia: No Tendon: No Muscle: No Joint: No Bone: No Large (67-100%) N/A N/A Epithelialization: Debridement - Selective/Open Wound N/A N/A Debridement: Pre-procedure Verification/Time Out 10:35 N/A N/A Taken: Lidocaine 4% Topical Solution N/A N/A Pain Control: Slough N/A N/A Tissue Debrided: Non-Viable Tissue N/A N/A Level: 0.04 N/A N/A Debridement A (sq cm): rea Forceps N/A N/A Instrument: Minimum N/A N/A Bleeding: Pressure N/A N/A Hemostasis A chieved: 0 N/A N/A Procedural Pain: 0 N/A N/A Post Procedural Pain: Procedure was tolerated well N/A N/A Debridement Treatment Response: 0.2x0.2x0.1 N/A N/A Post Debridement Measurements L x W x D (cm) 0.003 N/A N/A Post Debridement Volume: (cm) Excoriation: No N/A N/A Periwound Skin TextureESTHER, [name] ([id number]) [id number] Page 4 of 8 Induration: No Callus: No Crepitus: No Rash: No Scarring: No Dry/Scaly: No N/A N/A Periwound Skin Moisture: Atrophie Blanche: No N/A N/A Periwound Skin Color: Cyanosis: No Ecchymosis: No Erythema: No Hemosiderin Staining: No Mottled: No Pallor: No Rubor: No No Abnormality N/A N/A Temperature: Compression Therapy N/A N/A Procedures Performed: Debridement Treatment Notes Electronic Signature(s) Signed: 02/25/2022 10:57:15 AM By: [name] MD FACS Entered By: [name] on 02/25/2022 10:57:15 -------------------------------------------------------------------------------- Multi-Disciplinary Care Plan Details Patient Name: Date of Service: [name]. 02/25/2022 10:30 A M Medical Record Number: [id number] Patient Account Number: [id number] Date of  Birth/Sex: Treating RN: 07-03-1945 (77 y.o. [name] Primary Care Jacoby Zanni: Domingo Mend Other Clinician: Referring Glover Capano: Treating Ryshawn Sanzone/Extender: Caprice Kluver in Treatment: 22 Multidisciplinary Care Plan reviewed with physician Active Inactive Abuse / Safety / Falls / Self Care Management Nursing Diagnoses: History of Falls Potential for falls Goals: Patient will not experience any injury related to falls Date Initiated: 12/07/2021 Target Resolution Date: 03/25/2022 Goal Status: Active Patient/caregiver will verbalize/demonstrate measures taken to prevent injury and/or falls Date Initiated: 09/20/2021 Date Inactivated: 10/19/2021 Target Resolution Date: 10/18/2021 Goal Status: Met Interventions: Assess fall risk on admission and as needed Assess impairment of mobility on admission and as needed per policy Notes: Venous Leg Ulcer Nursing Diagnoses: Knowledge deficit related to disease process and management Potential for venous Insuffiency (use before diagnosis confirmed) Goals: Patient will maintain optimal edema control Date Initiated: 02/25/2022 Target Resolution Date: 03/25/2022 Goal Status: Active [name], [name] ([id number]) [id number] Page 5 of 8 Interventions: Assess  peripheral edema status every visit. Compression as ordered Treatment Activities: Therapeutic compression applied : 02/25/2022 Notes: Electronic Signature(s) Signed: 02/25/2022 5:38:46 PM By: Baruch Gouty RN, BSN Entered By: Baruch Gouty on 02/25/2022 10:34:38 -------------------------------------------------------------------------------- Pain Assessment Details Patient Name: Date of Service: [name]. 02/25/2022 10:30 A M Medical Record Number: [id number] Patient Account Number: [id number] Date of Birth/Sex: Treating RN: [birth year] (77 y.o. F) Primary Care Bionca Mckey: Domingo Mend Other  Clinician: Referring Kerie Badger: Treating Doneisha Ivey/Extender: Gevena Cotton Weeks in Treatment: 22 Active Problems Location of Pain Severity and Description of Pain Patient Has Paino No Site Locations Rate the pain. Current Pain Level: 0 Pain Management and Medication Current Pain Management: Electronic Signature(s) Signed: 02/25/2022 5:38:46 PM By: Baruch Gouty RN, BSN Entered By: Baruch Gouty on 02/25/2022 10:27:58 -------------------------------------------------------------------------------- Patient/Caregiver Education Details Patient Name: Date of Service: [name]. 2/19/2024andnbsp10:30 A M Medical Record Number: [id number] Patient Account Number: [id number] Date of Birth/Gender: Treating RN: 09-03-1945 (77 y.o. [name] Primary Care Physician: Domingo Mend Other Clinician: DREANNA, [name] ([id number]) [id number] Page 6 of 8 Referring Physician: Treating Physician/Extender: Caprice Kluver in Treatment: 22 Education Assessment Education Provided To: Patient Education Topics Provided Venous: Methods: Explain/Verbal Responses: Reinforcements needed, State content correctly Wound/Skin Impairment: Methods: Explain/Verbal Responses: Reinforcements needed, State content correctly Electronic Signature(s) Signed: 02/25/2022 5:38:46 PM By: Baruch Gouty RN, BSN Entered By: Baruch Gouty on 02/25/2022 10:35:10 -------------------------------------------------------------------------------- Wound Assessment Details Patient Name: Date of Service: [name]. 02/25/2022 10:30 A M Medical Record Number: [id number] Patient Account Number: [id number] Date of Birth/Sex: Treating RN: 1945-04-02 (77 y.o. [name], [name] Primary Care Taneika Choi: Domingo Mend Other Clinician: Referring Bryden Darden: Treating Winnie Barsky/Extender: Gevena Cotton Weeks in  Treatment: 22 Wound Status Wound Number: 2 Primary Open Surgical Wound Etiology: Wound Location: Right, Lateral Lower Leg Secondary Venous Leg Ulcer Wounding Event: Surgical Injury Etiology: Date Acquired: 08/17/2021 Wound Status: Open Weeks Of Treatment: 22 Comorbid Cataracts, Chronic sinus problems/congestion, Hypertension, Clustered Wound: Yes History: Crohns, Osteoarthritis Photos Wound Measurements Length: (cm) 0.1 Width: (cm) 0.1 Depth: (cm) 0.1 Area: (cm) 0.008 Volume: (cm) 0.001 % Reduction in Area: 100% % Reduction in Volume: 99.9% Epithelialization: Large (67-100%) Tunneling: No Undermining: No Wound Description Classification: Full Thickness Without Exposed Support Structures Wound Margin: Flat and Intact [name], [name] ([id number]) Exudate Amount: None Present Foul Odor After Cleansing: No Slough/Fibrino Yes [id number] Page 7 of 8 Wound Bed Granulation Amount: None Present (0%) Exposed Structure Necrotic Amount: Small (1-33%) Fascia Exposed: No Necrotic Quality: Adherent Slough Fat Layer (Subcutaneous Tissue) Exposed: Yes Tendon Exposed: No Muscle Exposed: No Joint Exposed: No Bone Exposed: No Periwound Skin Texture Texture Color No Abnormalities Noted: Yes No Abnormalities Noted: Yes Moisture Temperature / Pain No Abnormalities Noted: Yes Temperature: No Abnormality Treatment Notes Wound #2 (Lower Leg) Wound Laterality: Right, Lateral Cleanser Soap and Water Discharge Instruction: May shower and wash wound with dial antibacterial soap and water prior to dressing change. Wound Cleanser Discharge Instruction: Cleanse the wound with wound cleanser prior to applying a clean dressing using gauze sponges, not tissue or cotton balls. Peri-Wound Care Sween Lotion (Moisturizing lotion) Discharge Instruction: Apply moisturizing lotion as directed Topical Mupirocin Ointment Discharge Instruction: Apply Mupirocin (Bactroban)  as instructed Primary Dressing Hydrofera Blue Ready Transfer Foam, 2.5x2.5 (in/in) Discharge Instruction: Apply directly to wound bed as directed Santyl Ointment Discharge Instruction: Apply nickel thick amount to wound bed as instructed Secondary Dressing  Woven Gauze Sponge, Non-Sterile 4x4 in Discharge Instruction: Apply over primary dressing as directed. Secured With Compression Wrap ThreePress (3 layer compression wrap) Discharge Instruction: Apply three layer compression , or equivalent 2 layer wrap Compression Stockings Add-Ons Electronic Signature(s) Signed: 02/25/2022 5:38:46 PM By: Baruch Gouty RN, BSN Entered By: Baruch Gouty on 02/25/2022 10:31:57 -------------------------------------------------------------------------------- Vitals Details Patient Name: Date of Service: [name]. 02/25/2022 10:30 A M Medical Record Number: [id number] Patient Account Number: [id number] Date of Birth/Sex: Treating RN: [birth year] (77 y.o. F) Primary Care Marin Wisner: Domingo Mend Other Clinician: Referring Jenica Costilow: Treating Levina Boyack/Extender: [name], [name] ([id number]) [id number] Page 8 of 8 Weeks in Treatment: 22 Vital Signs Time Taken: 10:21 Temperature (F): 97.6 Height (in): 60 Pulse (bpm): 86 Weight (lbs): 101 Respiratory Rate (breaths/min): 20 Body Mass Index (BMI): 19.7 Blood Pressure (mmHg): 153/80 Reference Range: 80 - 120 mg / dl Electronic Signature(s) Signed: 02/25/2022 5:38:46 PM By: Baruch Gouty RN, BSN Entered By: Baruch Gouty on 02/25/2022 10:27:49

## 2022-03-04 ENCOUNTER — Encounter (HOSPITAL_BASED_OUTPATIENT_CLINIC_OR_DEPARTMENT_OTHER): Payer: Medicare Other | Admitting: General Surgery

## 2022-03-04 DIAGNOSIS — L97812 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d: Secondary | ICD-10-CM | POA: Diagnosis not present

## 2022-03-04 NOTE — Progress Notes (Signed)
ALTAMESE, THROOP [name] ([id number]) [id number] Page 1 of 9 Visit Report for 03/04/2022 Chief Complaint Document Details Patient Name: Date of Service: [name], [name] 03/04/2022 10:30 A M Medical Record Number: [id number] Patient Account Number: [id number] Date of Birth/Sex: Treating RN: [birth year] (77 y.o. F) Primary Care Provider: Domingo Mend Other Clinician: Referring Provider: Treating Provider/Extender: Caprice Kluver in Treatment: 23 Information Obtained from: Patient Chief Complaint 11/15/2020; patient is here for review of a wound area on the right posterior calf 09/20/2021: Patient is here for a nonhealing wound after undergoing Mohs surgery on her right lower leg. 12/07/2021: Reopening of skin at site of previous wound, new wound on right toe, concerning lesion on left upper leg just above knee Electronic Signature(s) Signed: 03/04/2022 10:56:26 AM By: [name] MD FACS Entered By: [name] on 03/04/2022 10:56:26 -------------------------------------------------------------------------------- HPI Details Patient Name: Date of Service: [name]. 03/04/2022 10:30 A M Medical Record Number: [id number] Patient Account Number: [id number] Date of Birth/Sex: Treating RN: 1945/01/08 (77 y.o. F) Primary Care Provider: Domingo Mend Other Clinician: Referring Provider: Treating Provider/Extender: Gevena Cotton Weeks in Treatment: 23 History of Present Illness HPI Description: ADMISSION 11/15/2020 This is a 77 year old woman with a history of squamous cell cancer skin cancer followed by Dr. Anabel Bene dermatology. She describes having a wound on the right posterior calf dating back since September. She saw her primary doctor in early October who suggested petroleum infused gauze. The patient has been doing this. No real improvement in fact she says this is getting larger. She does not  complain much of pain. She is completely uncertain about how this happened there was no trauma she simply became aware of it at some point describing it is about the size of a quarter but it has been getting progressively larger since then. She is here for review of this. Past medical history Crohn's disease not currently on prednisone, gastroesophageal reflux disease, history of squamous cell skin cancer, history of rheumatoid arthritis, hypertension 11/16; this lady had a large superficial atypical wound on the right posterior calf. She has significant solar skin damage probably some degree of venous insufficiency but she described a rapidly expanding wound area. Because of this and a history of skin cancer [name] went ahead and did a shave biopsy but we do not have the results of this at the time we saw her in clinic today. She has been using silver alginate and kerlix 11/30; shave biopsy [name] did last time did not show any malignancy or fungal elements. Suggestion of a stasis ulcer. With our treatment which is simply been silver alginate with kerlix that she is changing daily the wound is down dramatically almost surprising how rapidly it is reduced in surface area 12/14; patient comes in today with her wound healed this was on the right posterior calf. She has significant solar skin damage probably some degree of venous insufficiency however she described a rapidly expanding wound area on admission. Because of this and a history of skin cancer [name] did do a shave biopsy but nothing was really found of worry. 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 Unfortunately, the wound has failed to heal. There is thick slough on  the wound surface. The periwound skin  is in reasonably good condition. There is granulation tissue forming underneath the slough. 09/28/2021: The wound has deteriorated rather significantly over the past week. She had fairly copious drainage that she noticed as early as Monday, but she did not contact our office. Today there is a lot of periwound skin breakdown and a very pungent odor coming from the wound. There is thick slough on the wound surface but there is still good granulation tissue underlying this. Despite this, however, the wound actually measures smaller. 10/05/2021: The culture that [name] took grew out MRSA. [name] prescribed doxycycline and she is still taking this. There has been a tremendous improvement in the wound. It is smaller and has contracted considerably. There is hypertrophic granulation tissue present. No malodorous drainage. 10/16/2021: The wound has contracted further and is epithelializing. The surface is very clean and the hypertrophic granulation tissue has not recurred. She completed her course of doxycycline. [name], [name] ([id number]) [id number] Page 2 of 9 10/19; surgical wound on the right medial lower extremity secondary to Mohs surgery done in August. She is using Hydrofera Blue under 3 layer compression and making excellent progress. 11/02/2021: The wound is nearly completely epithelialized. Just a couple of open areas with some eschar overlying the surface. 11/12/2021: Just 1 small open area remains. It is clean without any slough or eschar accumulation. No hypertrophic granulation tissue present. 11/19/2021: Her wound is healed. READMISSION 12/07/2021 The patient has been moving for the past couple of weeks and has spent a substantial amount of time on her feet. As result, she developed leg swelling and the tissue where her wound had been, which she was protecting with a Band-Aid, broke down considerably. She now has a large wound that is irregular with extensive slough,  involving the fat layer. She also developed a blister on her toe that has slough and eschar present. She also pointed out lump just above her left knee that she is concerned about, as she does have a history of multiple skin cancers. 12/11; patient comes in the clinic with a large wound on the right anterior lower leg. This was initially a skin cancer underwent Mohs surgery and was treated in this clinic and healed out on 11/13. The patient states the wound reopened with edema and also trauma from a dressing she was putting on it. 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 She also has a wound on the right second toe. The patient states quite adamantly she will not go back to her previous dermatologist for the skin surgery center in [place] blaming them for the original wound on her right lateral leg. [name] am not exactly sure of her rationale but at this point she certainly will not consider a referral there 12/18; the patient has an appointment at [facility] on 12/28 although [name] am not sure exactly who she is seeing. This is in reference to the skin cancer just above her left patella. She would not go back to the skin surgery or indeed her dermatologist in [place]. She had an area on the right second toe this is healed today. Her original surgical site on the right anterior lateral lower leg actually looks some better. [name] changed her to Iodoflex last week 01/01/2022: The patient has an appointment with plastic surgery on Thursday to evaluate the biopsy-proven squamous cell carcinoma on her left upper leg. The wound on her right lower leg has a thick layer of slough  on the surface and has a very atypical appearance. 01/08/2022: The biopsies that [name] took last week from the wound on her right lower extremity were negative for malignancy but did demonstrate some bacteria along with inflammation. She met with the plastic surgeon on Thursday and she is going to undergo  wide local excision of the biopsy-proven squamous cell carcinoma on her left upper leg. The right lower leg wound is narrower again this week. There is slough and eschar as well as some fibrinous debris in the areas where [name] took the biopsies and obtained hemostasis with silver nitrate. 01/14/2022: The wound is narrower today, but is beginning to accumulate hypertrophic granulation tissue. Thin layer of slough on the surface. There is still slough and fibrinous debris in the 2 biopsy sites. 01/21/2022: The wound continues to contract. There is still slough and nonviable subcutaneous tissue in both biopsy sites. 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 01/28/2022: About half the wound has epithelialized. The remaining open portion has hypertrophic granulation tissue present. 02/04/2022: The wound is smaller by about a centimeter. There is some slough accumulation in the old biopsy site. The rest of the wound is clean. 02/11/2022: The wound is down to just about a centimeter. There is still some slough accumulation at the old biopsy site. 02/18/2022: The only remaining open area is a tiny hole from the biopsy site. There is slough accumulation. 02/25/2022: The biopsy site hole remains open. There is fibrinous exudate present. 03/04/2022: Her wound is healed. Electronic Signature(s) Signed: 03/04/2022 11:03:55 AM By: [name] MD FACS Entered By: [name] on 03/04/2022 11:03:54 -------------------------------------------------------------------------------- Physical Exam Details Patient Name: Date of Service: [name]. 03/04/2022 10:30 A M Medical Record Number: [id number] Patient Account Number: [id number] Date of Birth/Sex: Treating RN: Dec 06, 1945 (77 y.o. F) Primary Care Provider: Domingo Mend Other Clinician: Referring  Provider: Treating Provider/Extender: Gevena Cotton Weeks in Treatment: 23 Constitutional Hypertensive, asymptomatic. . . . no acute distress. Notes 03/04/2022: Her wound is healed. Electronic Signature(s) Signed: 03/04/2022 11:07:16 AM By: [name] MD FACS Entered By: [name] on 03/04/2022 11:07:16 [name], [name] ([id number]) [id number] Page 3 of 9 -------------------------------------------------------------------------------- Physician Orders Details Patient Name: Date of Service: [name], [name] 03/04/2022 10:30 A M Medical Record Number: [id number] Patient Account Number: [id number] Date of Birth/Sex: Treating RN: [birth year] (77 y.o. [name], [name] Primary Care Provider: Domingo Mend Other Clinician: Referring Provider: Treating Provider/Extender: Caprice Kluver in Treatment: 11 Verbal / Phone Orders: No Diagnosis Coding ICD-10 Coding Code Description [phone] 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d T81.31XS Disruption of external operation (surgical) wound, not elsewhere classified, sequela L98.9 Disorder of the skin and subcutaneous tissue, unspecified C44.722 Squamous cell carcinoma of skin of right lower limb, including hip I10 Essential (primary) hypertension K50.90 Crohn's disease, unspecified, without complications [id number] Unspecified severe protein-calorie malnutrition Discharge From [facility] Discharge from [place] Bathing/ Shower/ Hygiene May shower and wash wound with soap and water. Edema Control - Lymphedema / SCD / Other Avoid standing for long periods of time. Exercise regularly Moisturize legs daily. Non Wound Condition Right Lower Extremity pply the following to affected area as directed: - antibacterial ointment to healed wound site daily A Electronic Signature(s) Signed:  03/04/2022 12:01:26 PM  By: [name] MD FACS Entered By: [name] on 03/04/2022 11:07:27 -------------------------------------------------------------------------------- Problem List Details Patient Name: Date of Service: [name]. 03/04/2022 10:30 A M Medical Record Number: [id number] Patient Account Number: [id number] Date of Birth/Sex: Treating RN: Jun 10, 1945 (77 y.o. [name] Primary Care Provider: Domingo Mend Other Clinician: Referring Provider: Treating Provider/Extender: Gevena Cotton Weeks in Treatment: 23 Active Problems ICD-10 Encounter Code Description Active Date MDM Diagnosis L97.812 Non-pressure chronic ulcer of other part of right lower leg with fat layer 09/20/2021 No Yes exposed T81.31XS Disruption of external operation (surgical) wound, not elsewhere classified, 09/20/2021 No Yes sequela L98.9 Disorder of the skin and subcutaneous tissue, unspecified 12/07/2021 No Yes [name], [name] ([id number]) [id number] Page 4 of 9 [phone] Squamous cell carcinoma of skin of right lower limb, including hip 09/20/2021 No Yes I10 Essential (primary) hypertension 09/20/2021 No Yes K50.90 Crohn's disease, unspecified, without complications [id number] No Yes E43 Unspecified severe protein-calorie malnutrition 09/20/2021 No Yes Inactive Problems ICD-10 Code Description Active Date Inactive Date L97.512 Non-pressure chronic ulcer of other part of right foot with fat layer exposed 12/07/2021 12/07/2021 Resolved Problems Electronic Signature(s) Signed: 03/04/2022 10:54:52 AM By: [name] MD FACS Entered By: [name] on 03/04/2022 10:54:52 -------------------------------------------------------------------------------- Progress Note Details Patient Name: Date of Service: [name]. 03/04/2022 10:30 A M Medical Record Number: [id number] Patient Account Number: [id number] Date of Birth/Sex: Treating  RN: Jul 05, 1945 (77 y.o. F) Primary Care Provider: Domingo Mend Other Clinician: Referring Provider: Treating Provider/Extender: Caprice Kluver in Treatment: 23 Subjective Chief Complaint Information obtained from Patient 11/15/2020; patient is here for review of a wound area on the right posterior calf 09/20/2021: Patient is here for a nonhealing wound after undergoing Mohs surgery on her right lower leg. 12/07/2021: Reopening of skin at site of previous wound, new wound on right toe, concerning lesion on left upper leg just above knee History of Present Illness (HPI) ADMISSION 11/15/2020 This is a 77 year old woman with a history of squamous cell cancer skin cancer followed by Dr. Anabel Bene dermatology. She describes having a wound on the right posterior calf dating back since September. She saw her primary doctor in early October who suggested petroleum infused gauze. The patient has been doing this. No real improvement in fact she says this is getting larger. She does not complain much of pain. She is completely uncertain about how this happened there was no trauma she simply became aware of it at some point describing it is about the size of a quarter but it has been getting progressively larger since then. She is here for review of this. Past medical history Crohn's disease not currently on prednisone, gastroesophageal reflux disease, history of squamous cell skin cancer, history of rheumatoid arthritis, hypertension 11/16; this lady had a large superficial atypical wound on the right posterior calf. She has significant solar skin damage probably some degree of venous insufficiency but she described a rapidly expanding wound area. Because of this and a history of skin cancer [name] went ahead and did a shave biopsy but we do not have the results of this at the time we saw her in clinic today. She has been using silver alginate and kerlix 11/30; shave biopsy [name] did  last time did not show any malignancy or fungal elements. Suggestion of a stasis ulcer. With our treatment which is simply been silver alginate with kerlix that she is changing daily  the wound is down dramatically almost surprising how rapidly it is reduced in surface area 12/14; patient comes in today with her wound healed this was on the right posterior calf. She has significant solar skin damage probably some degree of venous insufficiency however she described a rapidly expanding wound area on admission. Because of this and a history of skin cancer [name] did do a shave biopsy but nothing was really found of worry. She tells me that she had biopsies and her gastroenterologist is now saying she has "colitis" rather than Crohn's disease and she is now on steroids orally READMISSION 09/20/2021 [name], [name] ([id number]) [id number] Page 5 of 9 She returns to clinic today having undergone a Mohs procedure on her right anterior tibial surface on August 17, 2021. Unfortunately, the wound has failed to heal. There is thick slough on the wound surface. The periwound skin is in reasonably good condition. There is granulation tissue forming underneath the slough. 09/28/2021: The wound has deteriorated rather significantly over the past week. She had fairly copious drainage that she noticed as early as Monday, but she did not contact our office. Today there is a lot of periwound skin breakdown and a very pungent odor coming from the wound. There is thick slough on the wound surface but there is still good granulation tissue underlying this. Despite this, however, the wound actually measures smaller. 10/05/2021: The culture that [name] took grew out MRSA. [name] prescribed doxycycline and she is still taking this. There has been a tremendous improvement in the wound. It is smaller and has contracted considerably. There is hypertrophic granulation tissue present. No malodorous drainage. 10/16/2021:  The wound has contracted further and is epithelializing. The surface is very clean and the hypertrophic granulation tissue has not recurred. She completed her course of doxycycline. 10/19; surgical wound on the right medial lower extremity secondary to Mohs surgery done in August. She is using Hydrofera Blue under 3 layer compression and making excellent progress. 11/02/2021: The wound is nearly completely epithelialized. Just a couple of open areas with some eschar overlying the surface. 11/12/2021: Just 1 small open area remains. It is clean without any slough or eschar accumulation. No hypertrophic granulation tissue present. 11/19/2021: Her wound is healed. READMISSION 12/07/2021 The patient has been moving for the past couple of weeks and has spent a substantial amount of time on her feet. As result, she developed leg swelling and the tissue where her wound had been, which she was protecting with a Band-Aid, broke down considerably. She now has a large wound that is irregular with extensive slough, involving the fat layer. She also developed a blister on her toe that has slough and eschar present. She also pointed out lump just above her left knee that she is concerned about, as she does have a history of multiple skin cancers. 12/11; patient comes in the clinic with a large wound on the right anterior lower leg. This was initially a skin cancer underwent Mohs surgery and was treated in this clinic and healed out on 11/13. The patient states the wound reopened with edema and also trauma from a dressing she was putting on it. 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 She also has a wound on the right second toe. The patient states quite adamantly she will not go back to her previous dermatologist for the skin surgery center in [place] blaming them for the original wound on her right lateral leg. [name] am not exactly  sure of her rationale but at this point  she certainly will not consider a referral there 12/18; the patient has an appointment at [facility] on 12/28 although [name] am not sure exactly who she is seeing. This is in reference to the skin cancer just above her left patella. She would not go back to the skin surgery or indeed her dermatologist in [place]. She had an area on the right second toe this is healed today. Her original surgical site on the right anterior lateral lower leg actually looks some better. [name] changed her to Iodoflex last week 01/01/2022: The patient has an appointment with plastic surgery on Thursday to evaluate the biopsy-proven squamous cell carcinoma on her left upper leg. The wound on her right lower leg has a thick layer of slough on the surface and has a very atypical appearance. 01/08/2022: The biopsies that [name] took last week from the wound on her right lower extremity were negative for malignancy but did demonstrate some bacteria along with inflammation. She met with the plastic surgeon on Thursday and she is going to undergo wide local excision of the biopsy-proven squamous cell carcinoma on her left upper leg. The right lower leg wound is narrower again this week. There is slough and eschar as well as some fibrinous debris in the areas where [name] took the biopsies and obtained hemostasis with silver nitrate. 01/14/2022: The wound is narrower today, but is beginning to accumulate hypertrophic granulation tissue. Thin layer of slough on the surface. There is still slough and fibrinous debris in the 2 biopsy sites. 01/21/2022: The wound continues to contract. There is still slough and nonviable subcutaneous tissue in both biopsy sites. 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 01/28/2022: About half the wound has epithelialized. The remaining open portion has hypertrophic  granulation tissue present. 02/04/2022: The wound is smaller by about a centimeter. There is some slough accumulation in the old biopsy site. The rest of the wound is clean. 02/11/2022: The wound is down to just about a centimeter. There is still some slough accumulation at the old biopsy site. 02/18/2022: The only remaining open area is a tiny hole from the biopsy site. There is slough accumulation. 02/25/2022: The biopsy site hole remains open. There is fibrinous exudate present. 03/04/2022: Her wound is healed. Patient History Information obtained from Patient. Family History Diabetes - Father, Heart Disease - Father, Hypertension - Father, No family history of Cancer, Hereditary Spherocytosis, Kidney Disease, Lung Disease, Seizures, Stroke, Thyroid Problems, Tuberculosis. Social History Former smoker - quit 30 years ago, Marital Status - Married, Alcohol Use - Never, Drug Use - No History, Caffeine Use - Rarely. Medical History Eyes Patient has history of Cataracts - Had cataract surgery Denies history of Glaucoma, Optic Neuritis Ear/Nose/Mouth/Throat Patient has history of Chronic sinus problems/congestion - Post Nasal drip Denies history of Middle ear problems Hematologic/Lymphatic Denies history of Anemia, Hemophilia, Human Immunodeficiency Virus, Lymphedema, Sickle Cell Disease Respiratory [name], [name] ([id number]) [id number] Page 6 of 9 Denies history of Aspiration, Asthma, Chronic Obstructive Pulmonary Disease (COPD), Pneumothorax, Sleep Apnea, Tuberculosis Cardiovascular Patient has history of Hypertension Gastrointestinal Patient has history of Crohnoos - Has Chrons Disease Denies history of Cirrhosis , Colitis, Hepatitis A, Hepatitis B, Hepatitis C Endocrine Denies history of Type [name] Diabetes, Type II Diabetes Genitourinary Denies history of End Stage Renal Disease  Immunological Denies history of Lupus Erythematosus, Raynaudoos,  Scleroderma Musculoskeletal Patient has history of Osteoarthritis - Knees Denies history of Gout, Rheumatoid Arthritis, Osteomyelitis Neurologic Denies history of Dementia, Neuropathy, Quadriplegia, Paraplegia, Seizure Disorder Oncologic Denies history of Received Chemotherapy, Received Radiation Psychiatric Denies history of Anorexia/bulimia, Confinement Anxiety Hospitalization/Surgery History - bil TKA. - Moh's procedure right leg 08/17/21. - bil cataract extractions. - nasal fx surgery. Medical A Surgical History Notes nd Gastrointestinal Has a large hiatal hernia. Has Chrons Disease., GERD, pancreatic insufficiency Endocrine Pancreatic Insufficiency Genitourinary stage 3 Chronic Kidney disease Integumentary (Skin) Had Squamous Cell (skin cancer). Had a skin graft on Right knee (area) Musculoskeletal Had Bil knee replacements, hx cervical spine fx, sternal fx, thoracis spine fx Neurologic left foot drop Oncologic Had Squamous Cell (skin cell) Objective Constitutional Hypertensive, asymptomatic. no acute distress. Vitals Time Taken: 10:38 AM, Height: 60 in, Weight: 101 lbs, BMI: 19.7, Temperature: 98 F, Pulse: 96 bpm, Respiratory Rate: 18 breaths/min, Blood Pressure: 150/61 mmHg. General Notes: 03/04/2022: Her wound is healed. Integumentary (Hair, Skin) Wound #2 status is Open. Original cause of wound was Surgical Injury. The date acquired was: 08/17/2021. The wound has been in treatment 23 weeks. The wound is located on the Right,Lateral Lower Leg. The wound measures 0cm length x 0cm width x 0cm depth; 0cm^2 area and 0cm^3 volume. There is no tunneling or undermining noted. There is a small amount of serosanguineous drainage noted. The wound margin is flat and intact. There is no granulation within the wound bed. There is no necrotic tissue within the wound bed. The periwound skin appearance had no abnormalities noted for texture. The periwound skin appearance had no  abnormalities noted for moisture. The periwound skin appearance had no abnormalities noted for color. Periwound temperature was noted as No Abnormality. 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 Edema Control - Lymphedema / SCD / Other: Avoid standing for long periods of time. Exercise regularly Moisturize legs daily. Non Wound Condition: Apply the following to affected area as directed: - antibacterial ointment to healed wound site daily 03/04/2022: Her wound is healed. She is quite nervous about it potentially reopening. [name] told her she could wash with antibacterial soap and rub some triple antibiotic ointment into the site daily to try and keep the bacterial load down. She will be discharged from the wound care center and may follow-up as needed. Electronic Signature(s) Signed: 03/04/2022 11:08:20 AM By: [name] MD FACS Entered By: [name] on 03/04/2022 11:08:19 -------------------------------------------------------------------------------- HxROS Details Patient Name: Date of Service: [name]. 03/04/2022 10:30 A M Medical Record Number: [id number] Patient Account Number: [id number] Date of Birth/Sex: Treating RN: 07-14-1945 (77 y.o. F) Primary Care Provider: Domingo Mend Other Clinician: Referring Provider: Treating  Provider/Extender: Caprice Kluver in Treatment: 23 Information Obtained From Patient Eyes Medical History: Positive for: Cataracts - Had cataract surgery Negative for: Glaucoma; Optic Neuritis Ear/Nose/Mouth/Throat Medical History: Positive for: Chronic sinus problems/congestion - Post Nasal drip Negative for: Middle ear problems Hematologic/Lymphatic  Medical History: Negative for: Anemia; Hemophilia; Human Immunodeficiency Virus; Lymphedema; Sickle Cell Disease Respiratory Medical History: Negative for: Aspiration; Asthma; Chronic Obstructive Pulmonary Disease (COPD); Pneumothorax; Sleep Apnea; Tuberculosis Cardiovascular Medical History: Positive for: Hypertension Gastrointestinal Medical History: Positive for: Crohns - Has Chrons Disease Negative for: Cirrhosis ; Colitis; Hepatitis A; Hepatitis B; Hepatitis C Past Medical History Notes: Has a large hiatal hernia. Has Chrons Disease., GERD, pancreatic insufficiency Endocrine Medical History: Negative for: Type [name] Diabetes; Type II Diabetes Past Medical History NotesLAAIBAH, [name] ([id number]) [id number] Page 8 of 9 Pancreatic Insufficiency Genitourinary Medical History: Negative for: End Stage Renal Disease Past Medical History Notes: stage 3 Chronic Kidney disease Immunological Medical History: Negative for: Lupus Erythematosus; Raynauds; Scleroderma Integumentary (Skin) Medical History: Past Medical History Notes: Had Squamous Cell (skin cancer). Had a skin graft on Right knee (area) Musculoskeletal Medical History: Positive for: Osteoarthritis - Knees Negative for: Gout; Rheumatoid Arthritis; Osteomyelitis Past Medical History Notes: Had Bil knee replacements, hx cervical spine fx, sternal fx, thoracis spine fx Neurologic Medical History: Negative for: Dementia; Neuropathy; Quadriplegia; Paraplegia; Seizure Disorder Past Medical History Notes: left  foot drop Oncologic Medical History: Negative for: Received Chemotherapy; Received Radiation Past Medical History Notes: Had Squamous Cell (skin cell) Psychiatric Medical History: Negative for: Anorexia/bulimia; Confinement Anxiety HBO Extended History Items Ear/Nose/Mouth/Throat: Eyes: Chronic sinus Cataracts problems/congestion Immunizations Pneumococcal Vaccine: Received Pneumococcal Vaccination: Yes Received Pneumococcal Vaccination On or After 60th Birthday: Yes Tetanus Vaccine: Last tetanus shot: 11/16/2011 Implantable Devices None Hospitalization / Surgery History Type of Hospitalization/Surgery bil TKA Moh's procedure right leg 08/17/21 bil cataract extractions nasal fx surgery Family and Social History Cancer: No; Diabetes: Yes - Father; Heart Disease: Yes - Father; Hereditary Spherocytosis: No; Hypertension: Yes - Father; Kidney Disease: No; Lung Disease: No; Seizures: No; Stroke: No; Thyroid Problems: No; Tuberculosis: No; Former smoker - quit 30 years ago; Marital Status - Married; Alcohol Use: Never; Drug Use: No History; Caffeine Use: Rarely; Financial Concerns: No; Food, Clothing or Shelter Needs: No; Support System Lacking: No; Transportation Concerns: No Electronic Signature(s) [name], [name] ([id number]) [id number] Page 9 of 9 Signed: 03/04/2022 12:01:26 PM By: [name] MD FACS Entered By: [name] on 03/04/2022 11:04:04 -------------------------------------------------------------------------------- SuperBill Details Patient Name: Date of Service: [name]. 03/04/2022 Medical Record Number: [id number] Patient Account Number: [id number] Date of Birth/Sex: Treating RN: 16-Jan-1945 (77 y.o. 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, including hip I10 Essential (primary) hypertension K50.90 Crohn's disease, unspecified, without complications [id number] Unspecified severe protein-calorie malnutrition Facility Procedures : CPT4 Code: [id number] Description: 99213 - WOUND CARE VISIT-LEV 3 EST PT Modifier: Quantity: 1 Physician Procedures : CPT4 Code Description Modifier [id number] - WC PHYS LEVEL 2 - EST PT ICD-10 Diagnosis Description L97.812 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d T81.31XS Disruption of external operation (surgical) wound,  not elsewhere classified, sequela L98.9 Disorder of the skin and subcutaneous tissue, unspecified E43 Unspecified severe protein-calorie malnutrition Quantity: 1 Electronic Signature(s) Signed: 03/04/2022 11:09:08 AM By: [name] MD FACS Entered By: [name] on 03/04/2022 11:09:08

## 2022-03-05 NOTE — Progress Notes (Signed)
[name], [name] ([id number]) [id number] Page 1 of 7 Visit Report for 03/04/2022 Arrival Information Details Patient Name: Date of Service: [name] 03/04/2022 10:30 A M Medical Record Number: [id number] Patient Account Number: [id number] Date of Birth/Sex: Treating RN: September 30, 1945 (77 y.o. [name], [name] Primary Care [name]: [name] Other Clinician: Referring [name]: Treating [name]/Extender: [name] in Treatment: 23 Visit Information History Since Last Visit Added or deleted any medications: No Patient Arrived: Ambulatory Any new allergies or adverse reactions: No Arrival Time: 10:29 Had a fall or experienced change in No Accompanied By: self activities of daily living that may affect Transfer Assistance: None risk of falls: Patient Identification Verified: Yes Signs or symptoms of abuse/neglect since last visito No Secondary Verification Process Completed: Yes Hospitalized since last visit: No Patient Requires Transmission-Based Precautions: No Implantable device outside of the clinic excluding No Patient Has Alerts: No cellular tissue based products placed in the center since last visit: Has Dressing in Place as Prescribed: Yes Has Compression in Place as Prescribed: Yes Pain Present Now: No Electronic Signature(s) Signed: 03/04/2022 5:30:43 PM By: April Gouty RN, BSN Entered By: [name] on 03/04/2022 10:38:26 -------------------------------------------------------------------------------- Clinic Level of Care Assessment Details Patient Name: Date of Service: [name]. 03/04/2022 10:30 A M Medical Record Number: [id number] Patient Account Number: [id number] Date of Birth/Sex: Treating RN: 01/29/1945 (77 y.o. [name], [name] Primary Care [name]: [name] Other Clinician: Referring Lamario Mani: Treating [name]/Extender: [name] Weeks in Treatment: 23 Clinic Level of Care Assessment Items TOOL 4 Quantity Score '[]'$  - 0 Use when only an EandM is performed on FOLLOW-UP visit ASSESSMENTS - Nursing Assessment / Reassessment X- 1 10 Reassessment of Co-morbidities (includes updates in patient status) X- 1 5 Reassessment of Adherence to Treatment Plan ASSESSMENTS - Wound and Skin A ssessment / Reassessment X - Simple Wound Assessment / Reassessment - one wound 1 5 '[]'$  - 0 Complex Wound Assessment / Reassessment - multiple wounds '[]'$  - 0 Dermatologic / Skin Assessment (not related to wound area) ASSESSMENTS - Focused Assessment X- 1 5 Circumferential Edema Measurements - multi extremities '[]'$  - 0 Nutritional Assessment / Counseling / Intervention X- 1 5 Lower Extremity Assessment (monofilament, tuning fork, pulses) '[]'$  - 0 Peripheral Arterial Disease Assessment (using hand held doppler) ASSESSMENTS - Ostomy and/or Continence Assessment and Care '[]'$  - 0 Incontinence Assessment and Management '[]'$  - 0 Ostomy Care Assessment and Management (repouching, etc.) PROCESS - Coordination of Care [name] ([id number]) [id number] Page 2 of 7 X- 1 15 Simple Patient / Family Education for ongoing care '[]'$  - 0 Complex (extensive) Patient / Family Education for ongoing care X- 1 10 Staff obtains Programmer, systems, Records, T Results / Process Orders est '[]'$  - 0 Staff telephones HHA, Nursing Homes / Clarify orders / etc '[]'$  - 0 Routine Transfer to another Facility (non-emergent condition) '[]'$  - 0 Routine Hospital Admission (non-emergent condition) '[]'$  - 0 New Admissions / Biomedical engineer / Ordering NPWT Apligraf, etc. , '[]'$  - 0 Emergency Hospital Admission (emergent condition) X- 1 10 Simple Discharge Coordination '[]'$  - 0 Complex (extensive) Discharge Coordination PROCESS - Special Needs '[]'$  - 0 Pediatric / Minor Patient Management '[]'$  - 0 Isolation Patient Management '[]'$  - 0 Hearing  / Language / Visual special needs '[]'$  - 0 Assessment of Community assistance (transportation, D/C planning, etc.) '[]'$  - 0 Additional assistance / Altered mentation '[]'$  - 0 Support Surface(s) Assessment (bed,  cushion, seat, etc.) INTERVENTIONS - Wound Cleansing / Measurement X - Simple Wound Cleansing - one wound 1 5 '[]'$  - 0 Complex Wound Cleansing - multiple wounds X- 1 5 Wound Imaging (photographs - any number of wounds) '[]'$  - 0 Wound Tracing (instead of photographs) '[]'$  - 0 Simple Wound Measurement - one wound '[]'$  - 0 Complex Wound Measurement - multiple wounds INTERVENTIONS - Wound Dressings '[]'$  - 0 Small Wound Dressing one or multiple wounds '[]'$  - 0 Medium Wound Dressing one or multiple wounds '[]'$  - 0 Large Wound Dressing one or multiple wounds X- 1 5 Application of Medications - topical '[]'$  - 0 Application of Medications - injection INTERVENTIONS - Miscellaneous '[]'$  - 0 External ear exam '[]'$  - 0 Specimen Collection (cultures, biopsies, blood, body fluids, etc.) '[]'$  - 0 Specimen(s) / Culture(s) sent or taken to Lab for analysis '[]'$  - 0 Patient Transfer (multiple staff / Civil Service fast streamer / Similar devices) '[]'$  - 0 Simple Staple / Suture removal (25 or less) '[]'$  - 0 Complex Staple / Suture removal (26 or more) '[]'$  - 0 Hypo / Hyperglycemic Management (close monitor of Blood Glucose) '[]'$  - 0 Ankle / Brachial Index (ABI) - do not check if billed separately X- 1 5 Vital Signs Has the patient been seen at the hospital within the last three years: Yes Total Score: 85 Level Of Care: New/Established - Level 3 Electronic Signature(s) Signed: 03/04/2022 5:30:43 PM By: April Gouty RN, BSN [name] ([id number]) [id number] Page 3 of 7 Entered By: [name] on 03/04/2022 10:55:32 -------------------------------------------------------------------------------- Encounter Discharge Information Details Patient Name: Date of Service: [name], [name]  03/04/2022 10:30 A M Medical Record Number: [id number] Patient Account Number: [id number] Date of Birth/Sex: Treating RN: 08-02-1945 (77 y.o. [name] Primary Care Kehaulani Fruin: [name] Other Clinician: Referring Jeray Shugart: Treating Divinity Kyler/Extender: [name] in Treatment: 23 Encounter Discharge Information Items Discharge Condition: Stable Ambulatory Status: Ambulatory Discharge Destination: Home Transportation: Private Auto Accompanied By: self Schedule Follow-up Appointment: Yes Clinical Summary of Care: Patient Declined Electronic Signature(s) Signed: 03/04/2022 5:30:43 PM By: April Gouty RN, BSN Entered By: [name] on 03/04/2022 10:56:22 -------------------------------------------------------------------------------- Lower Extremity Assessment Details Patient Name: Date of Service: [name]. 03/04/2022 10:30 A M Medical Record Number: [id number] Patient Account Number: [id number] Date of Birth/Sex: Treating RN: 07-15-1945 (77 y.o. [name] Primary Care Herb Beltre: [name] Other Clinician: Referring Manette Doto: Treating Aradia Estey/Extender: [name] Weeks in Treatment: 23 Edema Assessment Assessed: [Left: No] [Right: No] Edema: [Left: N] [Right: o] Calf Left: Right: Point of Measurement: From Medial Instep 26 cm Ankle Left: Right: Point of Measurement: From Medial Instep 18 cm Vascular Assessment Pulses: Dorsalis Pedis Palpable: [Right:Yes] Electronic Signature(s) Signed: 03/04/2022 5:30:43 PM By: April Gouty RN, BSN Entered By: [name] on 03/04/2022 10:45:29 -------------------------------------------------------------------------------- Multi Wound Chart Details Patient Name: Date of Service: [name]. 03/04/2022 10:30 A M Medical Record Number: [id number] Patient Account Number: [id number] Date of Birth/Sex: Treating RN: 15-Feb-1945 (77  y.o. F) Primary Care Chloe Bluett: [name] Other Clinician: Referring Oney Tatlock: Treating Lachlan Pelto/Extender: Galile[place], Recla [name] ([id number]) [id number] Page 4 of 7 Weeks in Treatment: 23 Vital Signs Height(in): 60 Pulse(bpm): 96 Weight(lbs): 101 Blood Pressure(mmHg): 150/61 Body Mass Index(BMI): 19.7 Temperature(F): 98 Respiratory Rate(breaths/min): 18 [2:Photos:] [N/A:N/A] Right, Lateral Lower Leg N/A N/A Wound Location: Surgical Injury N/A N/A Wounding Event: Open Surgical Wound N/A N/A Primary  Etiology: Venous Leg Ulcer N/A N/A Secondary Etiology: Cataracts, Chronic sinus N/A N/A Comorbid History: problems/congestion, Hypertension, Crohns, Osteoarthritis 08/17/2021 N/A N/A Date Acquired: 47 N/A N/A Weeks of Treatment: Open N/A N/A Wound Status: No N/A N/A Wound Recurrence: Yes N/A N/A Clustered Wound: 0x0x0 N/A N/A Measurements L x W x D (cm) 0 N/A N/A A (cm) : rea 0 N/A N/A Volume (cm) : 100.00% N/A N/A % Reduction in Area: 100.00% N/A N/A % Reduction in Volume: Full Thickness Without Exposed N/A N/A Classification: Support Structures Small N/A N/A Exudate Amount: Serosanguineous N/A N/A Exudate Type: red, brown N/A N/A Exudate Color: Flat and Intact N/A N/A Wound Margin: None Present (0%) N/A N/A Granulation Amount: None Present (0%) N/A N/A Necrotic Amount: Fascia: No N/A N/A Exposed Structures: Fat Layer (Subcutaneous Tissue): No Tendon: No Muscle: No Joint: No Bone: No Large (67-100%) N/A N/A Epithelialization: Excoriation: No N/A N/A Periwound Skin Texture: Induration: No Callus: No Crepitus: No Rash: No Scarring: No Dry/Scaly: No N/A N/A Periwound Skin Moisture: Atrophie Blanche: No N/A N/A Periwound Skin Color: Cyanosis: No Ecchymosis: No Erythema: No Hemosiderin Staining: No Mottled: No Pallor: No Rubor: No No Abnormality N/A  N/A Temperature: Treatment Notes Electronic Signature(s) Signed: 03/04/2022 10:55:14 AM By: [name] MD FACS Entered By: [name] on 03/04/2022 10:55:14 [name], [name] ([id number]) [id number] Page 5 of 7 -------------------------------------------------------------------------------- Multi-Disciplinary Care Plan Details Patient Name: Date of Service: MECHELE, [name] 03/04/2022 10:30 A M Medical Record Number: [id number] Patient Account Number: [id number] Date of Birth/Sex: Treating RN: 1945/07/22 (77 y.o. [name] Primary Care Jamez Ambrocio: [name] Other Clinician: Referring Jonita Hirota: Treating Aarya Quebedeaux/Extender: [name] in Treatment: [place] reviewed with physician Active Inactive Electronic Signature(s) Signed: 03/04/2022 5:30:43 PM By: April Gouty RN, BSN Entered By: [name] on 03/04/2022 10:46:52 -------------------------------------------------------------------------------- Pain Assessment Details Patient Name: Date of Service: [name]. 03/04/2022 10:30 A M Medical Record Number: [id number] Patient Account Number: [id number] Date of Birth/Sex: Treating RN: 07/25/1945 (77 y.o. [name] Primary Care Haide Klinker: [name] Other Clinician: Referring Nirel Babler: Treating Rhyleigh Grassel/Extender: [name] Weeks in Treatment: 23 Active Problems Location of Pain Severity and Description of Pain Patient Has Paino No Site Locations Rate the pain. Current Pain Level: 0 Pain Management and Medication Current Pain Management: Electronic Signature(s) Signed: 03/04/2022 5:30:43 PM By: April Gouty RN, BSN Entered By: [name] on 03/04/2022 10:42:43 -------------------------------------------------------------------------------- Patient/Caregiver Education Details Patient Name: Date of Service: [name] 2/26/2024andnbsp10:30 [place] Record Number: [id number] Patient Account Number: [id number] Date of Birth/Gender: Treating RN: 1945/03/03 (77 y.o. [name] Primary Care Physician: [name] Other Clinician: Referring Physician: Treating Physician/Extender: [name] in Treatment: [address], [place] [name] ([id number]) [id number] Page 6 of 7 Education Assessment Education Provided To: Patient Education Topics Provided Wound/Skin Impairment: Methods: Explain/Verbal Responses: Reinforcements needed, State content correctly Electronic Signature(s) Signed: 03/04/2022 5:30:43 PM By: April Gouty RN, BSN Entered By: [name] on 03/04/2022 10:47:08 -------------------------------------------------------------------------------- Wound Assessment Details Patient Name: Date of Service: [name]. 03/04/2022 10:30 A M Medical Record Number: [id number] Patient Account Number: [id number] Date of Birth/Sex: Treating RN: 06/11/1945 (77 y.o. [name] Primary Care Keison Glendinning: [name] Other Clinician: Referring Chanceler Pullin: Treating Hardie Veltre/Extender: [name] Weeks in Treatment: 23 Wound Status Wound Number: 2 Primary Open Surgical Wound Etiology: Wound Location: Right,  Lateral Lower Leg Secondary Venous Leg Ulcer Wounding Event: Surgical Injury Etiology: Date Acquired: 08/17/2021 Wound Status: Open Weeks Of Treatment: 23 Comorbid Cataracts, Chronic sinus problems/congestion, Hypertension, Clustered Wound: Yes History: Crohns, Osteoarthritis Photos Wound Measurements Length: (cm) Width: (cm) Depth: (cm) Area: (cm) Volume: (cm) 0 % Reduction in Area: 100% 0 % Reduction in Volume: 100% 0 Epithelialization: Large (67-100%) 0 Tunneling: No 0 Undermining: No Wound Description Classification: Full Thickness Without Exposed Support  Structures Wound Margin: Flat and Intact Exudate Amount: Small Exudate Type: Serosanguineous Exudate Color: red, brown Foul Odor After Cleansing: No Slough/Fibrino No Wound Bed Granulation Amount: None Present (0%) Exposed Structure Necrotic Amount: None Present (0%) Fascia Exposed: No Fat Layer (Subcutaneous Tissue) Exposed: No Tendon Exposed: No Muscle Exposed: No Joint Exposed: No Bone Exposed: No [name], [name] ([id number]) [id number] Page 7 of 7 Periwound Skin Texture Texture Color No Abnormalities Noted: Yes No Abnormalities Noted: Yes Moisture Temperature / Pain No Abnormalities Noted: Yes Temperature: No Abnormality Electronic Signature(s) Signed: 03/04/2022 5:30:43 PM By: April Gouty RN, BSN Entered By: [name] on 03/04/2022 10:46:31 -------------------------------------------------------------------------------- Vitals Details Patient Name: Date of Service: [name]. 03/04/2022 10:30 A M Medical Record Number: [id number] Patient Account Number: [id number] Date of Birth/Sex: Treating RN: Nov 06, 1945 (77 y.o. [name] Primary Care Quirino Kakos: [name] Other Clinician: Referring Morgan Keinath: Treating Keegan Bensch/Extender: [name] Weeks in Treatment: 23 Vital Signs Time Taken: 10:38 Temperature (F): 98 Height (in): 60 Pulse (bpm): 96 Weight (lbs): 101 Respiratory Rate (breaths/min): 18 Body Mass Index (BMI): 19.7 Blood Pressure (mmHg): 150/61 Reference Range: 80 - 120 mg / dl Electronic Signature(s) Signed: 03/04/2022 5:30:43 PM By: April Gouty RN, BSN Entered By: [name] on 03/04/2022 10:39:56

## 2022-03-11 ENCOUNTER — Ambulatory Visit (HOSPITAL_BASED_OUTPATIENT_CLINIC_OR_DEPARTMENT_OTHER): Payer: Medicare Other | Admitting: General Surgery

## 2022-03-12 ENCOUNTER — Other Ambulatory Visit: Payer: Self-pay | Admitting: Internal Medicine

## 2022-03-12 DIAGNOSIS — N183 Chronic kidney disease, stage 3 unspecified: Secondary | ICD-10-CM

## 2022-03-12 DIAGNOSIS — I10 Essential (primary) hypertension: Secondary | ICD-10-CM

## 2022-03-12 DIAGNOSIS — E785 Hyperlipidemia, unspecified: Secondary | ICD-10-CM

## 2022-03-18 ENCOUNTER — Ambulatory Visit (HOSPITAL_BASED_OUTPATIENT_CLINIC_OR_DEPARTMENT_OTHER): Payer: Medicare Other | Admitting: General Surgery

## 2022-03-20 ENCOUNTER — Ambulatory Visit: Payer: Medicare Other | Admitting: Internal Medicine

## 2022-03-31 ENCOUNTER — Other Ambulatory Visit: Payer: Self-pay | Admitting: Internal Medicine

## 2022-03-31 DIAGNOSIS — K219 Gastro-esophageal reflux disease without esophagitis: Secondary | ICD-10-CM

## 2022-04-08 ENCOUNTER — Telehealth: Payer: Self-pay | Admitting: Internal Medicine

## 2022-04-08 ENCOUNTER — Other Ambulatory Visit: Payer: Self-pay | Admitting: Internal Medicine

## 2022-04-08 DIAGNOSIS — J302 Other seasonal allergic rhinitis: Secondary | ICD-10-CM

## 2022-04-08 DIAGNOSIS — I10 Essential (primary) hypertension: Secondary | ICD-10-CM

## 2022-04-08 MED ORDER — DIPHENOXYLATE-ATROPINE 2.5-0.025 MG PO TABS: ORAL_TABLET | ORAL | 3 refills | Status: DC

## 2022-04-08 NOTE — Telephone Encounter (Signed)
Rx for Lomotil printed for Dr Henrene Pastor to sign.

## 2022-04-08 NOTE — Telephone Encounter (Signed)
Please advise if OK to refill Lomotil.  Thank you, [name]

## 2022-04-08 NOTE — Telephone Encounter (Signed)
Inbound call from patient requesting a refill for medication  "Lomotil".Please advise

## 2022-04-17 ENCOUNTER — Encounter: Payer: Self-pay | Admitting: Internal Medicine

## 2022-04-17 ENCOUNTER — Ambulatory Visit (INDEPENDENT_AMBULATORY_CARE_PROVIDER_SITE_OTHER): Payer: Medicare Other | Admitting: Internal Medicine

## 2022-04-17 VITALS — BP 130/66 | HR 79 | Temp 97.7°F | Ht 59.45 in | Wt 103.1 lb

## 2022-04-17 DIAGNOSIS — Z1231 Encounter for screening mammogram for malignant neoplasm of breast: Secondary | ICD-10-CM

## 2022-04-17 DIAGNOSIS — I10 Essential (primary) hypertension: Secondary | ICD-10-CM

## 2022-04-17 DIAGNOSIS — E785 Hyperlipidemia, unspecified: Secondary | ICD-10-CM | POA: Diagnosis not present

## 2022-04-17 DIAGNOSIS — B001 Herpesviral vesicular dermatitis: Secondary | ICD-10-CM

## 2022-04-17 DIAGNOSIS — Z0000 Encounter for general adult medical examination without abnormal findings: Secondary | ICD-10-CM

## 2022-04-17 DIAGNOSIS — Z780 Asymptomatic menopausal state: Secondary | ICD-10-CM

## 2022-04-17 DIAGNOSIS — F339 Major depressive disorder, recurrent, unspecified: Secondary | ICD-10-CM

## 2022-04-17 DIAGNOSIS — R609 Edema, unspecified: Secondary | ICD-10-CM

## 2022-04-17 DIAGNOSIS — J302 Other seasonal allergic rhinitis: Secondary | ICD-10-CM

## 2022-04-17 LAB — COMPREHENSIVE METABOLIC PANEL
ALT: 12 U/L (ref 0–35)
AST: 15 U/L (ref 0–37)
Albumin: 3.6 g/dL (ref 3.5–5.2)
Alkaline Phosphatase: 91 U/L (ref 39–117)
BUN: 28 mg/dL — ABNORMAL HIGH (ref 6–23)
CO2: 24 mEq/L (ref 19–32)
Calcium: 9.1 mg/dL (ref 8.4–10.5)
Chloride: 103 mEq/L (ref 96–112)
Creatinine, Ser: 1.14 mg/dL (ref 0.40–1.20)
GFR: 46.66 mL/min — ABNORMAL LOW (ref >60.00)
Glucose, Bld: 101 mg/dL — ABNORMAL HIGH (ref 70–99)
Potassium: 4.4 mEq/L (ref 3.5–5.1)
Sodium: 134 mEq/L — ABNORMAL LOW (ref 135–145)
Total Bilirubin: 0.3 mg/dL (ref 0.2–1.2)
Total Protein: 7.4 g/dL (ref 6.0–8.3)

## 2022-04-17 LAB — CBC WITH DIFFERENTIAL/PLATELET
Basophils Absolute: 0.1 10*3/uL (ref 0.0–0.1)
Basophils Relative: 1.1 % (ref 0.0–3.0)
Eosinophils Absolute: 0.7 10*3/uL (ref 0.0–0.7)
Eosinophils Relative: 6.9 % — ABNORMAL HIGH (ref 0.0–5.0)
HCT: 32.4 % — ABNORMAL LOW (ref 36.0–46.0)
Hemoglobin: 10.8 g/dL — ABNORMAL LOW (ref 12.0–15.0)
Lymphocytes Relative: 12.1 % (ref 12.0–46.0)
Lymphs Abs: 1.2 10*3/uL (ref 0.7–4.0)
MCHC: 33.3 g/dL (ref 30.0–36.0)
MCV: 88.3 fl (ref 78.0–100.0)
Monocytes Absolute: 0.7 10*3/uL (ref 0.1–1.0)
Monocytes Relative: 7.4 % (ref 3.0–12.0)
Neutro Abs: 7.2 10*3/uL (ref 1.4–7.7)
Neutrophils Relative %: 72.5 % (ref 43.0–77.0)
Platelets: 513 10*3/uL — ABNORMAL HIGH (ref 150.0–400.0)
RBC: 3.66 Mil/uL — ABNORMAL LOW (ref 3.87–5.11)
RDW: 15.1 % (ref 11.5–15.5)
WBC: 9.9 10*3/uL (ref 4.0–10.5)

## 2022-04-17 LAB — LIPID PANEL
Cholesterol: 156 mg/dL (ref 0–200)
HDL: 58.8 mg/dL (ref >39.00)
LDL Cholesterol: 86 mg/dL (ref 0–99)
NonHDL: 96.74
Total CHOL/HDL Ratio: 3
Triglycerides: 56 mg/dL (ref 0.0–149.0)
VLDL: 11.2 mg/dL (ref 0.0–40.0)

## 2022-04-17 MED ORDER — VALACYCLOVIR HCL 1 G PO TABS: ORAL_TABLET | ORAL | 1 refills | Status: DC

## 2022-04-17 MED ORDER — CETIRIZINE HCL 10 MG PO TABS: 10.0000 mg | ORAL_TABLET | Freq: Every day | ORAL | 1 refills | Status: DC

## 2022-04-17 MED ORDER — FLUTICASONE PROPIONATE 50 MCG/ACT NA SUSP: 2.0000 | Freq: Every day | NASAL | 1 refills | Status: DC

## 2022-04-17 MED ORDER — FUROSEMIDE 20 MG PO TABS: 20.0000 mg | ORAL_TABLET | Freq: Every day | ORAL | 1 refills | Status: DC | PRN

## 2022-04-17 MED ORDER — LOSARTAN POTASSIUM 100 MG PO TABS: 100.0000 mg | ORAL_TABLET | Freq: Every day | ORAL | 1 refills | Status: DC

## 2022-04-17 MED ORDER — DULOXETINE HCL 60 MG PO CPEP: 60.0000 mg | ORAL_CAPSULE | Freq: Two times a day (BID) | ORAL | 1 refills | Status: DC

## 2022-04-17 MED ORDER — MUPIROCIN 2 % EX OINT: 1.0000 | TOPICAL_OINTMENT | Freq: Two times a day (BID) | CUTANEOUS | 3 refills | Status: DC

## 2022-04-17 NOTE — Progress Notes (Signed)
Established Patient Office Visit     CC/Reason for Visit: Subsequent Medicare wellness visit  HPI: ARYIANA KLINKNER is a 77 y.o. female who is coming in today for the above mentioned reasons. Past Medical History is significant for: large hiatal hernia and Crohn's disease.  She is feeling well without concerns.  She is requesting medication refills.  She has routine eye and dental care, she wears hearing aids bilaterally.  She will consider RSV vaccine but not interested in shingles, flu or COVID.  She is overdue for mammogram and DEXA.   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   Whooping cough 2015   Whooping cough with pneumonia    2017    Past Surgical History:  Procedure Laterality Date   CATARACT EXTRACTION, BILATERAL  2018   with IOC implant    COLONOSCOPY     DEBRIDEMENT AND CLOSURE WOUND Right 10/01/2018   Procedure: Excision of right knee wound with placement of ACell and Pravena;  Surgeon: Peggye Form, DO;  Location: [facility];  Service: Plastics;  Laterality: Right;  30 min   JOINT REPLACEMENT  2020   right knee   knee fracture      NASAL FRACTURE SURGERY     needed b/c she was getting frequent sinus infections    TOTAL KNEE ARTHROPLASTY Right 07/28/2018   Procedure: TOTAL KNEE ARTHROPLASTY;  Surgeon: Durene Romans, MD;  Location: [facility];   Service: Orthopedics;  Laterality: Right;   TOTAL KNEE ARTHROPLASTY Left 03/02/2019   Procedure: TOTAL KNEE ARTHROPLASTY;  Surgeon: Durene Romans, MD;  Location: [facility];  Service: Orthopedics;  Laterality: Left;  70 mins   UPPER GASTROINTESTINAL ENDOSCOPY      Social History:  reports that she quit smoking about 34 years ago. Her smoking use included cigarettes. She has a 30.00 pack-year smoking history. She has never used smokeless tobacco. She reports that she does not currently use alcohol. She reports that she does not use drugs.  Allergies: Allergies  Allergen Reactions   Adhesive [Tape]     Redness and skin peeling   Aspirin     Intestinal Bleeding   Ciprofloxacin Nausea And Vomiting   Codeine Nausea And Vomiting   Demerol [Meperidine Hcl] Nausea And Vomiting   Fentanyl Nausea And Vomiting    Makes me very sick   Lactose Intolerance (Gi) Other (See Comments)    Pt has Crohn's    Latex     Redness and skin peeling   Other     Nuts-itching in throat  Seeds-stomach issues with chrons     Prednisone Other (See Comments)    Upset stomach   Septra [Sulfamethoxazole-Trimethoprim] Nausea Only    Family  History:  Family History  Problem Relation Age of Onset   Diabetes Father    Parkinson's disease Father    Colon cancer Other    Asthma Neg Hx    Esophageal cancer Neg Hx    Pancreatic cancer Neg Hx    Liver disease Neg Hx    Stomach cancer Neg Hx      Current Outpatient Medications:    albuterol (VENTOLIN HFA) 108 (90 Base) MCG/ACT inhaler, Inhale 2 puffs into the lungs every 6 (six) hours as needed for wheezing or shortness of breath., Disp: 8 g, Rfl: 6   Calcium Citrate-Vitamin D (CALCIUM + D PO), Take 1 tablet by mouth 2 (two) times daily., Disp: , Rfl:    colestipol (COLESTID) 1 g tablet, Take 2 tablets (2 g total) by mouth 2 (two) times daily., Disp: 180 tablet, Rfl: 3   dicyclomine (BENTYL) 20 MG tablet, Take 1 tablet (20 mg total) by mouth 4 (four) times daily  as needed for spasms., Disp: 360 tablet, Rfl: 3   diphenoxylate-atropine (LOMOTIL) 2.5-0.025 MG tablet, TAKE 2 TABLETS IN THE MORNING, AT NOON AND AT BEDTIME AS DIRECTED, Disp: 180 tablet, Rfl: 3   mesalamine (LIALDA) 1.2 g EC tablet, Take 4 tablets (4.8 g total) by mouth daily with breakfast., Disp: 360 tablet, Rfl: 3   Multiple Vitamin (MULTIVITAMIN WITH MINERALS) TABS tablet, Take 1 tablet by mouth daily., Disp: , Rfl:    mupirocin ointment (BACTROBAN) 2 %, Apply 1 Application topically 2 (two) times daily., Disp: 30 g, Rfl: 3   omeprazole (PRILOSEC) 40 MG capsule, TAKE 1 CAPSULE (40 MG TOTAL) BY MOUTH IN THE MORNING, AT NOON, AND AT BEDTIME., Disp: 270 capsule, Rfl: 1   cetirizine (ZYRTEC) 10 MG tablet, Take 1 tablet (10 mg total) by mouth daily., Disp: 90 tablet, Rfl: 1   DULoxetine (CYMBALTA) 60 MG capsule, Take 1 capsule (60 mg total) by mouth 2 (two) times daily., Disp: 180 capsule, Rfl: 1   fluticasone (FLONASE) 50 MCG/ACT nasal spray, Place 2 sprays into both nostrils daily., Disp: 48 mL, Rfl: 1   furosemide (LASIX) 20 MG tablet, Take 1 tablet (20 mg total) by mouth daily as needed for edema., Disp: 90 tablet, Rfl: 1   losartan (COZAAR) 100 MG tablet, Take 1 tablet (100 mg total) by mouth daily., Disp: 90 tablet, Rfl: 1   valACYclovir (VALTREX) 1000 MG tablet, TAKE 1 TABLET (1,000 MG TOTAL) BY MOUTH TWICE A DAY AS NEEDED, Disp: 90 tablet, Rfl: 1  Review of Systems:  Negative unless indicated in HPI.   Physical Exam: Vitals:   04/17/22 1057 04/17/22 1109  BP: (!) 150/66 130/66  Pulse: 79   Temp: 97.7 F (36.5 C)   TempSrc: Oral   SpO2: 95%   Weight: 103 lb 1.6 oz (46.8 kg)   Height: 4' 11.45" (1.51 m)     Body mass index is 20.51 kg/m.   Physical Exam Vitals reviewed.  Constitutional:      General: She is not in acute distress.    Appearance: Normal appearance. She is not ill-appearing, toxic-appearing or diaphoretic.  HENT:     Head: Normocephalic.     Right Ear:  Tympanic membrane, ear canal and external ear normal. There is no impacted cerumen.     Left Ear: Tympanic membrane, ear canal and external ear normal. There is no impacted cerumen.     Nose: Nose normal.     Mouth/Throat:     Mouth: Mucous membranes are moist.  Pharynx: Oropharynx is clear. No oropharyngeal exudate or posterior oropharyngeal erythema.  Eyes:     General: No scleral icterus.       Right eye: No discharge.        Left eye: No discharge.     Conjunctiva/sclera: Conjunctivae normal.     Pupils: Pupils are equal, round, and reactive to light.  Neck:     Vascular: No carotid bruit.  Cardiovascular:     Rate and Rhythm: Normal rate and regular rhythm.     Pulses: Normal pulses.     Heart sounds: Normal heart sounds.  Pulmonary:     Effort: Pulmonary effort is normal. No respiratory distress.     Breath sounds: Normal breath sounds.  Abdominal:     General: Abdomen is flat. Bowel sounds are normal.     Palpations: Abdomen is soft.  Musculoskeletal:        General: Normal range of motion.     Cervical back: Normal range of motion.  Skin:    General: Skin is warm and dry.  Neurological:     General: No focal deficit present.     Mental Status: She is alert and oriented to person, place, and time. Mental status is at baseline.  Psychiatric:        Mood and Affect: Mood normal.        Behavior: Behavior normal.        Thought Content: Thought content normal.        Judgment: Judgment normal.      Subsequent Medicare wellness visit   1. Risk factors, based on past  M,S,F - Cardiac Risk Factors include: advanced age (>36men, >13 women)   2.  Physical activities: Dietary issues and exercise activities discussed:  Current Exercise Habits: Home exercise routine, Type of exercise: walking, Time (Minutes): 20, Frequency (Times/Week): 2, Weekly Exercise (Minutes/Week): 40, Intensity: Moderate   3.  Depression/mood:  Flowsheet Row Office Visit from 04/17/2022 in [facility] Total Score 0        4.  ADL's:    04/16/2022    1:38 PM 05/04/2021    7:47 AM  In your present state of health, do you have any difficulty performing the following activities:  Hearing? 1 1  Comment patient has hearing aids   Vision? 0 0  Difficulty concentrating or making decisions? 0 0  Walking or climbing stairs? 0 0  Dressing or bathing? 0 0  Doing errands, shopping? 0 0  Preparing Food and eating ? N   Using the Toilet? N   In the past six months, have you accidently leaked urine? N   Do you have problems with loss of bowel control? N   Managing your Medications? N   Managing your Finances? N   Housekeeping or managing your Housekeeping? N      5.  Fall risk:     09/19/2021   11:44 AM 11/11/2021   12:54 PM 11/20/2021    2:31 PM 04/16/2022    1:42 PM 04/17/2022   11:24 AM  Fall Risk  Falls in the past year? 1  1 1 1   Was there an injury with Fall? 0  1 1 1   Fall Risk Category Calculator 2  3 3 3   Fall Risk Category (Retired) Moderate  High    (RETIRED) Patient Fall Risk Level Moderate fall risk Low fall risk High fall risk    Patient at Risk for Falls Due  to Impaired balance/gait  Impaired balance/gait Orthopedic patient No Fall Risks  Fall risk Follow up Falls evaluation completed  Falls evaluation completed Falls evaluation completed Falls evaluation completed     6.  Home safety: No problems identified   7.  Height weight, and visual acuity: height and weight as above, vision/hearing: Vision Screening   Right eye Left eye Both eyes  Without correction     With correction 20/20 20/25 20/20      8.  Counseling: Counseling given: Not Answered    9. Lab orders based on risk factors: Laboratory update will be reviewed   10. Cognitive assessment:        04/16/2022    1:43 PM  6CIT Screen  What Year? 0 points  What month? 0 points  What time? 0 points  Count back from 20 0 points  Months in reverse 0 points   Repeat phrase 0 points  Total Score 0 points     11. Screening: Patient provided with a written and personalized 5-10 year screening schedule in the AVS. Health Maintenance  Topic Date Due   DEXA scan (bone density measurement)  Never done   COVID-19 Vaccine (5 - 2023-24 season) 09/07/2021   Zoster (Shingles) Vaccine (1 of 2) 07/16/2022*   Hepatitis C Screening: USPSTF Recommendation to screen - Ages 18-79 yo.  08/09/2022*   Flu Shot  08/08/2022   Medicare Annual Wellness Visit  04/17/2023   DTaP/Tdap/Td vaccine (2 - Td or Tdap) 09/14/2031   Pneumonia Vaccine  Completed   HPV Vaccine  Aged Out   Colon Cancer Screening  Discontinued  *Topic was postponed. The date shown is not the original due date.    12. Provider List Update: Patient Care Team    Relationship Specialty Notifications Start End  Philip Aspen, Limmie Patricia, MD PCP - General Internal Medicine  08/07/18   Verner Chol, [facility] (Inactive) Pharmacist Pharmacist  10/18/20    Comment: [phone]     13. Advance Directives: Does Patient Have a Medical Advance Directive?: No Would patient like information on creating a medical advance directive?: No - Patient declined  14. Opioids: Patient is not on any opioid prescriptions and has no risk factors for a substance use disorder.   15.   Goals      Track and Manage My Blood Pressure-Hypertension     Timeframe:  Long-Range Goal Priority:  Medium Start Date:                             Expected End Date:                       Follow Up Date 03/19/21    - check blood pressure weekly - choose a place to take my blood pressure (home, clinic or office, retail store) - write blood pressure results in a log or diary    Why is this important?   You won't feel high blood pressure, but it can still hurt your blood vessels.  High blood pressure can cause heart or kidney problems. It can also cause a stroke.  Making lifestyle changes like losing a little weight or  eating less salt will help.  Checking your blood pressure at home and at different times of the day can help to control blood pressure.  If the doctor prescribes medicine remember to take it the way the doctor ordered.  Call the office if  you cannot afford the medicine or if there are questions about it.     Notes:          I have personally reviewed and noted the following in the patient's chart:   Medical and social history Use of alcohol, tobacco or illicit drugs  Current medications and supplements Functional ability and status Nutritional status Physical activity Advanced directives List of other physicians Hospitalizations, surgeries, and ER visits in previous 12 months Vitals Screenings to include cognitive, depression, and falls Referrals and appointments  In addition, I have reviewed and discussed with patient certain preventive protocols, quality metrics, and best practice recommendations. A written personalized care plan for preventive services as well as general preventive health recommendations were provided to patient.  Impression and Plan:  Medicare annual wellness visit, subsequent  Hyperlipidemia, unspecified hyperlipidemia type - Plan: Lipid panel, Lipid panel  Essential hypertension - Plan: CBC with Differential/Platelet, Comprehensive metabolic panel, losartan (COZAAR) 100 MG tablet, Comprehensive metabolic panel, CBC with Differential/Platelet  Depression, recurrent - Plan: DULoxetine (CYMBALTA) 60 MG capsule  Seasonal allergies - Plan: cetirizine (ZYRTEC) 10 MG tablet, fluticasone (FLONASE) 50 MCG/ACT nasal spray  Recurrent cold sores - Plan: valACYclovir (VALTREX) 1000 MG tablet  Edema, unspecified type - Plan: furosemide (LASIX) 20 MG tablet  Encounter for screening mammogram for malignant neoplasm of breast - Plan: MM Digital Screening  Postmenopausal estrogen deficiency - Plan: DG Bone Density  -Recommend routine eye and dental care. -Healthy  lifestyle discussed in detail. -Labs to be updated today. -Prostate cancer screening: N/A Health Maintenance  Topic Date Due   DEXA scan (bone density measurement)  Never done   COVID-19 Vaccine (5 - 2023-24 season) 09/07/2021   Zoster (Shingles) Vaccine (1 of 2) 07/16/2022*   Hepatitis C Screening: USPSTF Recommendation to screen - Ages 18-79 yo.  08/09/2022*   Flu Shot  08/08/2022   Medicare Annual Wellness Visit  04/17/2023   DTaP/Tdap/Td vaccine (2 - Td or Tdap) 09/14/2031   Pneumonia Vaccine  Completed   HPV Vaccine  Aged Out   Colon Cancer Screening  Discontinued  *Topic was postponed. The date shown is not the original due date.   -DEXA and mammogram requested.     Chaya Jan, MD [facility]

## 2022-04-25 ENCOUNTER — Ambulatory Visit (HOSPITAL_BASED_OUTPATIENT_CLINIC_OR_DEPARTMENT_OTHER)
Admission: RE | Admit: 2022-04-25 | Discharge: 2022-04-25 | Disposition: A | Discharge status: Home / Self Care | Payer: Medicare Other | Source: Ambulatory Visit | Attending: Internal Medicine | Admitting: Internal Medicine

## 2022-04-25 DIAGNOSIS — Z1231 Encounter for screening mammogram for malignant neoplasm of breast: Secondary | ICD-10-CM

## 2022-05-08 ENCOUNTER — Telehealth: Payer: Self-pay

## 2022-05-08 NOTE — Progress Notes (Signed)
Care Management & Coordination Services Pharmacy Team  Reason for Encounter: General adherence update   Contacted patient for general health update and medication adherence call.  Unsuccessful outreach. Left voicemail for patient to return call. Multiple attempts   What concerns do you have about your medications?  The patient  side effects with their medications.  How often do you forget or accidentally miss a dose?   Do you use a pillbox?   Are you having any problems getting your medications from your pharmacy?   Has the cost of your medications been a concern?  If yes, what medication and is patient assistance available or has it been applied for?  Since last visit with PharmD,  interventions have been made.   The patient  had an ED visit since last contact.  The patient  problems with their health.   Patient  concerns or questions for , PharmD at this time.   Counseled patient on:    Chart Updates:  Recent office visits:  04/17/2022 Chaya Jan MD - Patient was seen for Medicare annual wellness visit, subsequent and additional concerns. Started Mupirocin.   11/20/2021 Peggye Pitt MD - Patient was seen for encounter for staple removal and additional concerns. Started Fluconazole and Florastor.   Recent consult visits:  04/01/2022 Rosalene Billings PA-C (emerg ortho) - Patient was seen for History of right total knee replacement and additional concerns. No additional chart notes.   02/06/2022 Yancey Flemings MD (GI) - Patient was seen for Diarrhea, unspecified type and additional concerns. No medication changes.   01/25/2022 Katherine Cobb NP(pulmonary) - Patient was seen for restrictive lung desease and an additional concern. Started Albuterol HFA.   11/12/2021 Duanne Guess MD (gen sx) Patient was seen for review of a wound area on the right posterior calf (multiple additional follow up visits)  Hospital visits:  Patient was seen at [facility] ED on  11/11/2021 due to fall and scalp laceration.    New?Medications Started at [facility] Discharge:?? None Medication Changes at Hospital Discharge: None Medications Discontinued at Hospital Discharge: None Medications that remain the same after Hospital Discharge:??  -All other medications will remain the same.    Medications: Outpatient Encounter Medications as of 05/08/2022  Medication Sig   albuterol (VENTOLIN HFA) 108 (90 Base) MCG/ACT inhaler Inhale 2 puffs into the lungs every 6 (six) hours as needed for wheezing or shortness of breath.   Calcium Citrate-Vitamin D (CALCIUM + D PO) Take 1 tablet by mouth 2 (two) times daily.   cetirizine (ZYRTEC) 10 MG tablet Take 1 tablet (10 mg total) by mouth daily.   colestipol (COLESTID) 1 g tablet Take 2 tablets (2 g total) by mouth 2 (two) times daily.   dicyclomine (BENTYL) 20 MG tablet Take 1 tablet (20 mg total) by mouth 4 (four) times daily as needed for spasms.   diphenoxylate-atropine (LOMOTIL) 2.5-0.025 MG tablet TAKE 2 TABLETS IN THE MORNING, AT NOON AND AT BEDTIME AS DIRECTED   DULoxetine (CYMBALTA) 60 MG capsule Take 1 capsule (60 mg total) by mouth 2 (two) times daily.   fluticasone (FLONASE) 50 MCG/ACT nasal spray Place 2 sprays into both nostrils daily.   furosemide (LASIX) 20 MG tablet Take 1 tablet (20 mg total) by mouth daily as needed for edema.   losartan (COZAAR) 100 MG tablet Take 1 tablet (100 mg total) by mouth daily.   mesalamine (LIALDA) 1.2 g EC tablet Take 4 tablets (4.8 g total) by mouth daily with breakfast.  Multiple Vitamin (MULTIVITAMIN WITH MINERALS) TABS tablet Take 1 tablet by mouth daily.   mupirocin ointment (BACTROBAN) 2 % Apply 1 Application topically 2 (two) times daily.   omeprazole (PRILOSEC) 40 MG capsule TAKE 1 CAPSULE (40 MG TOTAL) BY MOUTH IN THE MORNING, AT NOON, AND AT BEDTIME.   valACYclovir (VALTREX) 1000 MG tablet TAKE 1 TABLET (1,000 MG TOTAL) BY MOUTH TWICE A DAY AS NEEDED   No  facility-administered encounter medications on file as of 05/08/2022.  Fill History:  Dispensed Days Supply Quantity Provider Pharmacy  VENTOLIN HFA  [phone] Base) MCG/ACT AERS 01/25/2022 25 18 g      Dispensed Days Supply Quantity Provider Pharmacy  CETIRIZINE HCL 10 MG TABLET 04/08/2022 90 90 each      Dispensed Days Supply Quantity Provider Pharmacy  COLESTIPOL HCL 1 GM TABLET 04/22/2022 90 360 each      Dispensed Days Supply Quantity Provider Pharmacy  DICYCLOMINE 20 MG TABLET 04/08/2022 90 360 each      Dispensed Days Supply Quantity Provider Pharmacy  DULOXETINE HCL DR 60 MG CAP 03/19/2022 90 180 each      Dispensed Days Supply Quantity Provider Pharmacy  FLUTICASONE PROP 50 MCG SPRAY 02/20/2022 90 48 g      Dispensed Days Supply Quantity Provider Pharmacy  FUROSEMIDE 20 MG TABLET 04/07/2022 90 90 each      Dispensed Days Supply Quantity Provider Pharmacy  LOSARTAN POTASSIUM 100 MG TAB 02/11/2022 90 90 each      Dispensed Days Supply Quantity Provider Pharmacy  MESALAMINE DR 1.2 GM TABLET 04/22/2022 90 360 each      Dispensed Days Supply Quantity Provider Pharmacy  VALACYCLOVIR HCL 1 GRAM TABLET 08/27/2021 45 90 each     Recent vitals BP Readings from Last 3 Encounters:  04/17/22 130/66  02/06/22 136/78  01/25/22 138/68   Pulse Readings from Last 3 Encounters:  04/17/22 79  02/06/22 96  01/25/22 (!) 104   Wt Readings from Last 3 Encounters:  04/17/22 103 lb 1.6 oz (46.8 kg)  02/06/22 103 lb 12.8 oz (47.1 kg)  01/25/22 103 lb (46.7 kg)   BMI Readings from Last 3 Encounters:  04/17/22 20.51 kg/m  02/06/22 20.97 kg/m  01/25/22 20.46 kg/m    Recent lab results    Component Value Date/Time   NA 134 (L) 04/17/2022 1130   K 4.4 04/17/2022 1130   CL 103 04/17/2022 1130   CO2 24 04/17/2022 1130   GLUCOSE 101 (H) 04/17/2022 1130   BUN 28 (H) 04/17/2022 1130   CREATININE 1.14 04/17/2022 1130   CREATININE 1.10 (H) 11/08/2019 1048   CALCIUM 9.1 04/17/2022  1130    Lab Results  Component Value Date   CREATININE 1.14 04/17/2022   GFR 46.66 (L) 04/17/2022   GFRNONAA 55 (L) 01/15/2022   GFRAA >60 03/04/2019   Lab Results  Component Value Date/Time   HGBA1C 5.8 02/16/2019 03:34 PM    Lab Results  Component Value Date   CHOL 156 04/17/2022   HDL 58.80 04/17/2022   LDLCALC 86 04/17/2022   TRIG 56.0 04/17/2022   CHOLHDL 3 04/17/2022    Care Gaps: AWV - 04/17/2022 Dexascan - never done Covid - overdue Shingrix - postponed Hep C Screen - postponed  Star Rating Drugs:  Losartan 100 mg - last filled 02/11/2022 90 [facility] at [facility]  Inetta Fermo [facility]  Clinical Pharmacist Assistant [phone]

## 2022-05-09 ENCOUNTER — Other Ambulatory Visit: Payer: Self-pay | Admitting: Internal Medicine

## 2022-05-09 ENCOUNTER — Telehealth: Payer: Self-pay | Admitting: Internal Medicine

## 2022-05-09 NOTE — Telephone Encounter (Signed)
Contacted pharmacy and they indicate that patient has refills and they are already working on refill. Attempted to reach patient but got no answer. Left voicemail.

## 2022-05-09 NOTE — Telephone Encounter (Signed)
Patient called stating that she is out of refills for the medication Lomotil. Requesting a refill be sent to [facility] at [phone]. Please advise, thank you.

## 2022-05-21 ENCOUNTER — Encounter: Payer: Self-pay | Admitting: Internal Medicine

## 2022-05-21 ENCOUNTER — Ambulatory Visit (INDEPENDENT_AMBULATORY_CARE_PROVIDER_SITE_OTHER): Payer: Medicare Other | Admitting: Internal Medicine

## 2022-05-21 VITALS — BP 130/70 | HR 100 | Ht 59.5 in | Wt 104.1 lb

## 2022-05-21 DIAGNOSIS — K449 Diaphragmatic hernia without obstruction or gangrene: Secondary | ICD-10-CM

## 2022-05-21 DIAGNOSIS — K219 Gastro-esophageal reflux disease without esophagitis: Secondary | ICD-10-CM

## 2022-05-21 DIAGNOSIS — K5010 Crohn's disease of large intestine without complications: Secondary | ICD-10-CM | POA: Diagnosis not present

## 2022-05-21 DIAGNOSIS — R197 Diarrhea, unspecified: Secondary | ICD-10-CM

## 2022-05-21 DIAGNOSIS — K51919 Ulcerative colitis, unspecified with unspecified complications: Secondary | ICD-10-CM

## 2022-05-21 DIAGNOSIS — K591 Functional diarrhea: Secondary | ICD-10-CM | POA: Diagnosis not present

## 2022-05-21 MED ORDER — ONDANSETRON HCL 4 MG PO TABS: 4.0000 mg | ORAL_TABLET | Freq: Four times a day (QID) | ORAL | 1 refills | Status: DC | PRN

## 2022-05-21 MED ORDER — MESALAMINE 1.2 G PO TBEC: 4.8000 g | DELAYED_RELEASE_TABLET | Freq: Every day | ORAL | 3 refills | Status: DC

## 2022-05-21 MED ORDER — OMEPRAZOLE 40 MG PO CPDR: DELAYED_RELEASE_CAPSULE | ORAL | 1 refills | Status: DC

## 2022-05-21 MED ORDER — COLESTIPOL HCL 1 G PO TABS: 2.0000 g | ORAL_TABLET | Freq: Two times a day (BID) | ORAL | 3 refills | Status: DC

## 2022-05-21 MED ORDER — METRONIDAZOLE 250 MG PO TABS: 250.0000 mg | ORAL_TABLET | Freq: Three times a day (TID) | ORAL | 3 refills | Status: DC

## 2022-05-21 MED ORDER — DIPHENOXYLATE-ATROPINE 2.5-0.025 MG PO TABS: ORAL_TABLET | ORAL | 3 refills | Status: DC

## 2022-05-21 MED ORDER — DICYCLOMINE HCL 20 MG PO TABS: 20.0000 mg | ORAL_TABLET | Freq: Four times a day (QID) | ORAL | 3 refills | Status: DC | PRN
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
## 2022-05-21 NOTE — Patient Instructions (Signed)
_______________________________________________________  If your blood pressure at your visit was 140/90 or greater, please contact your primary care physician to follow up on this.  _______________________________________________________  If you are age 77 or older, your body mass index should be between 23-30. Your Body mass index is 20.68 kg/m. If this is out of the aforementioned range listed, please consider follow up with your Primary Care Provider.  If you are age 63 or younger, your body mass index should be between 19-25. Your Body mass index is 20.68 kg/m. If this is out of the aformentioned range listed, please consider follow up with your Primary Care Provider.   ________________________________________________________  The [facility] providers would like to encourage you to use [facility] to communicate with providers for non-urgent requests or questions.  Due to long hold times on the telephone, sending your provider a message by [facility] may be a faster and more efficient way to get a response.  Please allow 48 business hours for a response.  Please remember that this is for non-urgent requests.  _______________________________________________________  We have sent the following medications to your pharmacy for you to pick up at your convenience:  Flagyl, Zofran

## 2022-05-27 ENCOUNTER — Encounter: Payer: Self-pay | Admitting: Nurse Practitioner

## 2022-05-27 ENCOUNTER — Ambulatory Visit (INDEPENDENT_AMBULATORY_CARE_PROVIDER_SITE_OTHER): Payer: Medicare Other | Admitting: Nurse Practitioner

## 2022-05-27 VITALS — BP 136/72 | HR 83 | Temp 98.0°F | Ht 60.0 in | Wt 107.2 lb

## 2022-05-27 DIAGNOSIS — J9601 Acute respiratory failure with hypoxia: Secondary | ICD-10-CM | POA: Diagnosis not present

## 2022-05-27 DIAGNOSIS — J984 Other disorders of lung: Secondary | ICD-10-CM | POA: Diagnosis not present

## 2022-05-27 DIAGNOSIS — J432 Centrilobular emphysema: Secondary | ICD-10-CM | POA: Diagnosis not present

## 2022-05-27 DIAGNOSIS — J441 Chronic obstructive pulmonary disease with (acute) exacerbation: Secondary | ICD-10-CM

## 2022-05-27 MED ORDER — ALBUTEROL SULFATE HFA 108 (90 BASE) MCG/ACT IN AERS
2.0000 | INHALATION_SPRAY | Freq: Four times a day (QID) | RESPIRATORY_TRACT | 6 refills | Status: DC | PRN
Start: 2022-05-27 — End: 2023-11-25

## 2022-05-27 MED ORDER — BUDESONIDE-FORMOTEROL FUMARATE 160-4.5 MCG/ACT IN AERO: 2.0000 | INHALATION_SPRAY | Freq: Two times a day (BID) | RESPIRATORY_TRACT | 12 refills | Status: DC

## 2022-05-27 MED ORDER — METHYLPREDNISOLONE 4 MG PO TBPK
ORAL_TABLET | ORAL | 0 refills | Status: AC
Start: 2022-05-27 — End: ?

## 2022-05-27 MED ORDER — ALBUTEROL SULFATE (2.5 MG/3ML) 0.083% IN NEBU
2.5000 mg | INHALATION_SOLUTION | Freq: Four times a day (QID) | RESPIRATORY_TRACT | 5 refills | Status: DC | PRN
Start: 2022-05-27 — End: 2023-04-30

## 2022-05-27 NOTE — Assessment & Plan Note (Addendum)
Emphysematous exacerbation. Discussed role of maintenance inhaler therapy in prevention of future flares. She was agreeable to retrial of ICS/LABA therapy - rx sent for Symbicort. She will let us know if this is too expensive and we can send a test claim for our pharmacy team. Plan to treat her with medrol dosepak as she has had trouble with prednisone in the past. Action plan in place. She had walk test at the end of her OV. She desaturated to 74% on room air. Refused supplemental O2 offered by [facility] during test. She left prior to discussing with provider. I contacted her and discussed results and concerns. She again declined oxygen therapy, despite education on the risks of untreated hypoxia. Sent rx for neb machine and solution. Instructed to use twice daily, prior to inhaler until seen back. Close follow up in 1 week. Instructed that if she did not have any improvement or symptoms worsened, she would need to go to the ED or return for further workup with imaging/labs.   Patient Instructions  Continue Albuterol inhaler 2 puffs or 3 mL neb every 6 hours as needed for shortness of breath or wheezing.  Start Symbicort 2 puffs Twice daily. Brush tongue and rinse mouth afterwards. Call me if this is expensive and we will run a test claim through our pharmacy team to see what's covered  Continue flonase nasal spray 2 sprays each nostril daily  Continue zyrtec 1 tab daily   Medrol dosepak - take as prescribed   Strongly recommend starting oxygen therapy. You need to monitor your levels at home. Goal >88-90%. Hopefully, the steroids and new inhaler will resolve your oxygen requirements.   Follow up in 1 week with Dr. Francine Graven or Philis Nettle. If symptoms do not improve or worsen, please contact office for sooner follow up or seek emergency care.

## 2022-05-27 NOTE — Patient Instructions (Addendum)
Continue Albuterol inhaler 2 puffs or 3 mL neb every 6 hours as needed for shortness of breath or wheezing.  Start Symbicort 2 puffs Twice daily. Brush tongue and rinse mouth afterwards. Call me if this is expensive and we will run a test claim through our pharmacy team to see what's covered  Continue flonase nasal spray 2 sprays each nostril daily  Continue zyrtec 1 tab daily   Medrol dosepak - take as prescribed   Strongly recommend starting oxygen therapy. You need to monitor your levels at home. Goal >88-90%. Hopefully, the steroids and new inhaler will resolve your oxygen requirements.   Follow up in 1 week with Dr. Francine Graven or Philis Nettle. If symptoms do not improve or worsen, please contact office for sooner follow up or seek emergency care.
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
## 2022-05-29 ENCOUNTER — Encounter: Payer: Self-pay | Admitting: Nurse Practitioner

## 2022-05-29 DIAGNOSIS — J9600 Acute respiratory failure, unspecified whether with hypoxia or hypercapnia: Secondary | ICD-10-CM | POA: Insufficient documentation

## 2022-05-29 NOTE — Assessment & Plan Note (Signed)
Exertional. Able to maintain saturations >90% at rest. See above.

## 2022-05-29 NOTE — Assessment & Plan Note (Signed)
Secondary to severe scoliosis and very large hiatal hernia.

## 2022-06-06 ENCOUNTER — Encounter: Payer: Self-pay | Admitting: Nurse Practitioner

## 2022-06-06 ENCOUNTER — Ambulatory Visit (INDEPENDENT_AMBULATORY_CARE_PROVIDER_SITE_OTHER): Payer: Medicare Other | Admitting: Nurse Practitioner

## 2022-06-06 VITALS — BP 120/70 | HR 86 | Ht 60.0 in | Wt 107.2 lb

## 2022-06-06 DIAGNOSIS — J432 Centrilobular emphysema: Secondary | ICD-10-CM | POA: Diagnosis not present

## 2022-06-06 DIAGNOSIS — J9601 Acute respiratory failure with hypoxia: Secondary | ICD-10-CM

## 2022-06-06 DIAGNOSIS — J984 Other disorders of lung: Secondary | ICD-10-CM | POA: Diagnosis not present

## 2022-06-06 NOTE — Patient Instructions (Addendum)
Continue Albuterol inhaler 2 puffs or 3 mL neb every 6 hours as needed for shortness of breath or wheezing.  Continue Symbicort 2 puffs Twice daily. Brush tongue and rinse mouth afterwards. Call me if this is expensive and we will run a test claim through our pharmacy team to see what's covered  Continue flonase nasal spray 2 sprays each nostril daily  Continue zyrtec 1 tab daily  Monitor oxygen levels at home for goal >88-90%   Follow up in 8-10 weeks with [name] or [name]. If symptoms do not improve or worsen, please contact office for sooner follow up or seek emergency care.

## 2022-06-06 NOTE — Assessment & Plan Note (Signed)
Secondary to severe scoliosis and very large hiatal hernia.  
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
## 2022-06-06 NOTE — Assessment & Plan Note (Addendum)
Resolved exacerbation. Clinically improved. Lung exam clear today. Saturations have significantly improved; she was able to maintain >88-90% on room air during walk test today. Will hold off on further workup given improvement. Re-educated on importance of compliance with maintenance therapies. Action plan in place.  Patient Instructions  Continue Albuterol inhaler 2 puffs or 3 mL neb every 6 hours as needed for shortness of breath or wheezing.  Continue Symbicort 2 puffs Twice daily. Brush tongue and rinse mouth afterwards. Call me if this is expensive and we will run a test claim through our pharmacy team to see what's covered  Continue flonase nasal spray 2 sprays each nostril daily  Continue zyrtec 1 tab daily  Monitor oxygen levels at home for goal >88-90%   Follow up in 8-10 weeks with Dr. Francine Graven or Philis Nettle. If symptoms do not improve or worsen, please contact office for sooner follow up or seek emergency care.

## 2022-06-06 NOTE — Assessment & Plan Note (Signed)
Resolved. Likely secondary to acute exacerbation.

## 2022-07-15 ENCOUNTER — Ambulatory Visit: Payer: Medicare Other | Admitting: Nurse Practitioner

## 2022-08-02 ENCOUNTER — Ambulatory Visit: Payer: Medicare Other | Admitting: Pulmonary Disease

## 2022-08-21 ENCOUNTER — Ambulatory Visit (INDEPENDENT_AMBULATORY_CARE_PROVIDER_SITE_OTHER): Payer: Medicare Other | Admitting: Internal Medicine

## 2022-08-21 ENCOUNTER — Encounter: Payer: Self-pay | Admitting: Internal Medicine

## 2022-08-21 VITALS — BP 118/70 | HR 87 | Ht 61.0 in | Wt 111.0 lb

## 2022-08-21 DIAGNOSIS — K219 Gastro-esophageal reflux disease without esophagitis: Secondary | ICD-10-CM

## 2022-08-21 DIAGNOSIS — K449 Diaphragmatic hernia without obstruction or gangrene: Secondary | ICD-10-CM | POA: Diagnosis not present

## 2022-08-21 DIAGNOSIS — K5010 Crohn's disease of large intestine without complications: Secondary | ICD-10-CM

## 2022-08-21 DIAGNOSIS — K51919 Ulcerative colitis, unspecified with unspecified complications: Secondary | ICD-10-CM | POA: Diagnosis not present

## 2022-08-21 DIAGNOSIS — R109 Unspecified abdominal pain: Secondary | ICD-10-CM

## 2022-08-21 DIAGNOSIS — R197 Diarrhea, unspecified: Secondary | ICD-10-CM

## 2022-08-21 DIAGNOSIS — K591 Functional diarrhea: Secondary | ICD-10-CM

## 2022-08-21 MED ORDER — ONDANSETRON HCL 4 MG PO TABS: 4.0000 mg | ORAL_TABLET | Freq: Four times a day (QID) | ORAL | 1 refills | Status: DC | PRN

## 2022-08-21 MED ORDER — MESALAMINE 1.2 G PO TBEC: 4.8000 g | DELAYED_RELEASE_TABLET | Freq: Every day | ORAL | 3 refills | Status: DC

## 2022-08-21 MED ORDER — DICYCLOMINE HCL 20 MG PO TABS: 20.0000 mg | ORAL_TABLET | Freq: Four times a day (QID) | ORAL | 3 refills | Status: DC | PRN

## 2022-08-21 MED ORDER — COLESTIPOL HCL 1 G PO TABS: 2.0000 g | ORAL_TABLET | Freq: Two times a day (BID) | ORAL | 3 refills | Status: DC

## 2022-08-21 MED ORDER — DIPHENOXYLATE-ATROPINE 2.5-0.025 MG PO TABS: ORAL_TABLET | ORAL | 3 refills | Status: DC

## 2022-08-21 MED ORDER — METRONIDAZOLE 250 MG PO TABS: 250.0000 mg | ORAL_TABLET | Freq: Three times a day (TID) | ORAL | 3 refills | Status: DC

## 2022-08-21 MED ORDER — OMEPRAZOLE 40 MG PO CPDR
DELAYED_RELEASE_CAPSULE | ORAL | 3 refills | Status: DC
Start: 2022-08-21 — End: 2022-11-05
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
## 2022-08-21 NOTE — Patient Instructions (Signed)
We have sent the following medications to your pharmacy for you to pick up at your convenience:  Colestipol, Bentyl, Lomotil, Lialda, Flagyl, Omeprazole, Zofran.  _______________________________________________________  If your blood pressure at your visit was 140/90 or greater, please contact your primary care physician to follow up on this.  _______________________________________________________  If you are age 77 or older, your body mass index should be between 23-30. Your Body mass index is 20.97 kg/m. If this is out of the aforementioned range listed, please consider follow up with your Primary Care Provider.  If you are age 24 or younger, your body mass index should be between 19-25. Your Body mass index is 20.97 kg/m. If this is out of the aformentioned range listed, please consider follow up with your Primary Care Provider.   ________________________________________________________  The [place] [facility] providers would like to encourage you to use [facility] to communicate with providers for non-urgent requests or questions.  Due to long hold times on the telephone, sending your provider a message by [facility] may be a faster and more efficient way to get a response.  Please allow 48 business hours for a response.  Please remember that this is for non-urgent requests.  _______________________________________________________

## 2022-09-02 ENCOUNTER — Ambulatory Visit: Payer: Medicare Other | Admitting: Nurse Practitioner

## 2022-09-10 ENCOUNTER — Encounter: Payer: Self-pay | Admitting: Internal Medicine

## 2022-09-10 ENCOUNTER — Ambulatory Visit (INDEPENDENT_AMBULATORY_CARE_PROVIDER_SITE_OTHER): Payer: Medicare Other | Admitting: Internal Medicine

## 2022-09-10 VITALS — BP 120/70 | Temp 98.2°F | Wt 111.2 lb

## 2022-09-10 DIAGNOSIS — M25512 Pain in left shoulder: Secondary | ICD-10-CM | POA: Diagnosis not present

## 2022-09-10 NOTE — Progress Notes (Signed)
Established Patient Office Visit     CC/Reason for Visit: Left shoulder pain  HPI: [name] is a 77 y.o. female who is coming in today for the above mentioned reasons.  Left shoulder has been bothering for about a month.  No specific injury that she can recall.  Putting her seatbelt on is difficult as well as lateral raise.  Past Medical/Surgical History: Past Medical History:  Diagnosis Date   Allergies    Bruises easily    Complication of anesthesia    Crohn's disease (HCC)    GI Dr Faylene Million in [place], [place]    Deviated septum    GERD (gastroesophageal reflux disease)    Headache    occ    Hiatal hernia    HTN (hypertension)    pcp Dr Lucienne Minks  in [place], [place]    Kidney disease, chronic, stage III (GFR 30-59 ml/min) (HCC)    Neuropathy    fingers,periodically    Osteoarthritis    Pancreatic insufficiency    PONV (postoperative nausea and vomiting)    Rheumatoid arthritis (HCC)    Scoliosis    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   Whooping cough 2015   Whooping cough with pneumonia    2017    Past Surgical History:  Procedure Laterality Date   CATARACT EXTRACTION, BILATERAL  2018   with IOC implant    COLONOSCOPY     DEBRIDEMENT AND CLOSURE WOUND Right 10/01/2018   Procedure: Excision of right knee wound with placement of ACell and Pravena;  Surgeon: Peggye Form, DO;  Location: [facility];  Service: Plastics;  Laterality: Right;  30 min   JOINT REPLACEMENT  2020   right knee   knee fracture      NASAL FRACTURE SURGERY     needed b/c she was getting frequent sinus infections    TOTAL KNEE ARTHROPLASTY Right 07/28/2018   Procedure: TOTAL KNEE ARTHROPLASTY;  Surgeon: Durene Romans, MD;  Location: [facility];  Service: Orthopedics;  Laterality: Right;   TOTAL KNEE ARTHROPLASTY Left 03/02/2019   Procedure: TOTAL KNEE ARTHROPLASTY;  Surgeon: Durene Romans, MD;  Location: [facility];  Service: Orthopedics;   Laterality: Left;  70 mins   UPPER GASTROINTESTINAL ENDOSCOPY      Social History:  reports that she quit smoking about 34 years ago. Her smoking use included cigarettes. She started smoking about 64 years ago. She has a 30 pack-year smoking history. She has never used smokeless tobacco. She reports that she does not currently use alcohol. She reports that she does not use drugs.  Allergies: Allergies  Allergen Reactions   Adhesive [Tape]     Redness and skin peeling   Aspirin     Intestinal Bleeding   Ciprofloxacin Nausea And Vomiting   Codeine Nausea And Vomiting   Demerol [Meperidine Hcl] Nausea And Vomiting   Fentanyl Nausea And Vomiting    Makes me very sick   Lactose Intolerance (Gi) Other (See Comments)    Pt has Crohn's    Latex     Redness and skin peeling   Other     Nuts-itching in throat  Seeds-stomach issues with chrons     Prednisone Other (See Comments)    Upset stomach   Septra [Sulfamethoxazole-Trimethoprim] Nausea Only    Family History:  Family History  Problem Relation Age of Onset   Diabetes Father    Parkinson's disease Father  Colon cancer Other    Asthma Neg Hx    Esophageal cancer Neg Hx    Pancreatic cancer Neg Hx    Liver disease Neg Hx    Stomach cancer Neg Hx      Current Outpatient Medications:    budesonide-formoterol (SYMBICORT) 160-4.5 MCG/ACT inhaler, Inhale 2 puffs into the lungs in the morning and at bedtime., Disp: 1 each, Rfl: 12   Calcium Citrate-Vitamin D (CALCIUM + D PO), Take 1 tablet by mouth 2 (two) times daily., Disp: , Rfl:    cetirizine (ZYRTEC) 10 MG tablet, Take 1 tablet (10 mg total) by mouth daily., Disp: 90 tablet, Rfl: 1   colestipol (COLESTID) 1 g tablet, Take 2 tablets (2 g total) by mouth 2 (two) times daily., Disp: 180 tablet, Rfl: 3   dicyclomine (BENTYL) 20 MG tablet, Take 1 tablet (20 mg total) by mouth 4 (four) times daily as needed for spasms., Disp: 360 tablet, Rfl: 3   diphenoxylate-atropine  (LOMOTIL) 2.5-0.025 MG tablet, TAKE 2 TABLETS IN THE MORNING, AT NOON AND AT BEDTIME AS DIRECTED, Disp: 180 tablet, Rfl: 3   DULoxetine (CYMBALTA) 60 MG capsule, Take 1 capsule (60 mg total) by mouth 2 (two) times daily., Disp: 180 capsule, Rfl: 1   fluticasone (FLONASE) 50 MCG/ACT nasal spray, Place 2 sprays into both nostrils daily., Disp: 48 mL, Rfl: 1   furosemide (LASIX) 20 MG tablet, Take 1 tablet (20 mg total) by mouth daily as needed for edema., Disp: 90 tablet, Rfl: 1   loperamide (IMODIUM) 2 MG capsule, TAKE 1 CAPSULE BY MOUTH THREE TIMES A DAY AS NEEDED FOR DIARRHEA OR LOOSE STOOLS, Disp: 180 capsule, Rfl: 6   losartan (COZAAR) 100 MG tablet, Take 1 tablet (100 mg total) by mouth daily., Disp: 90 tablet, Rfl: 1   mesalamine (LIALDA) 1.2 g EC tablet, Take 4 tablets (4.8 g total) by mouth daily with breakfast., Disp: 360 tablet, Rfl: 3   methylPREDNISolone (MEDROL DOSEPAK) 4 MG TBPK tablet, Take as directed, Disp: 1 each, Rfl: 0   metroNIDAZOLE (FLAGYL) 250 MG tablet, Take 1 tablet (250 mg total) by mouth 3 (three) times daily., Disp: 42 tablet, Rfl: 3   Multiple Vitamin (MULTIVITAMIN WITH MINERALS) TABS tablet, Take 1 tablet by mouth daily., Disp: , Rfl:    mupirocin ointment (BACTROBAN) 2 %, Apply 1 Application topically 2 (two) times daily., Disp: 30 g, Rfl: 3   omeprazole (PRILOSEC) 40 MG capsule, TAKE 1 CAPSULE (40 MG TOTAL) BY MOUTH IN THE MORNING, AT NOON, AND AT BEDTIME., Disp: 270 capsule, Rfl: 3   ondansetron (ZOFRAN) 4 MG tablet, Take 1 tablet (4 mg total) by mouth every 6 (six) hours as needed for nausea or vomiting., Disp: 30 tablet, Rfl: 1   valACYclovir (VALTREX) 1000 MG tablet, TAKE 1 TABLET (1,000 MG TOTAL) BY MOUTH TWICE A DAY AS NEEDED, Disp: 90 tablet, Rfl: 1   albuterol (PROVENTIL) (2.5 MG/3ML) 0.083% nebulizer solution, Take 3 mLs (2.5 mg total) by nebulization every 6 (six) hours as needed for wheezing or shortness of breath. (Patient not taking: Reported on 09/10/2022),  Disp: 75 mL, Rfl: 5   albuterol (VENTOLIN HFA) 108 (90 Base) MCG/ACT inhaler, Inhale 2 puffs into the lungs every 6 (six) hours as needed for wheezing or shortness of breath. (Patient not taking: Reported on 09/10/2022), Disp: 8 g, Rfl: 6  Review of Systems:  Negative unless indicated in HPI.   Physical Exam: Vitals:   09/10/22 1059  BP: 120/70  Temp:  98.2 F (36.8 C)  TempSrc: Oral  Weight: 111 lb 3.2 oz (50.4 kg)    Body mass index is 21.01 kg/m.   Physical Exam Musculoskeletal:     Left shoulder: Decreased range of motion.     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  Refer to orthopedics for further treatment.   Time spent:22 minutes reviewing chart, interviewing and examining patient and formulating plan of care.     Chaya Jan, MD [facility]

## 2022-09-24 ENCOUNTER — Other Ambulatory Visit (HOSPITAL_BASED_OUTPATIENT_CLINIC_OR_DEPARTMENT_OTHER): Payer: Self-pay

## 2022-09-24 ENCOUNTER — Emergency Department (HOSPITAL_BASED_OUTPATIENT_CLINIC_OR_DEPARTMENT_OTHER): Payer: Medicare Other

## 2022-09-24 ENCOUNTER — Other Ambulatory Visit: Payer: Self-pay

## 2022-09-24 ENCOUNTER — Emergency Department (HOSPITAL_BASED_OUTPATIENT_CLINIC_OR_DEPARTMENT_OTHER)
Admission: EM | Admit: 2022-09-24 | Discharge: 2022-09-24 | Disposition: A | Discharge status: Home / Self Care | Payer: Medicare Other | Attending: Emergency Medicine | Admitting: Emergency Medicine

## 2022-09-24 ENCOUNTER — Encounter (HOSPITAL_BASED_OUTPATIENT_CLINIC_OR_DEPARTMENT_OTHER): Payer: Self-pay | Admitting: Emergency Medicine

## 2022-09-24 DIAGNOSIS — D72829 Elevated white blood cell count, unspecified: Secondary | ICD-10-CM | POA: Insufficient documentation

## 2022-09-24 DIAGNOSIS — Z91040 Latex allergy status: Secondary | ICD-10-CM | POA: Diagnosis not present

## 2022-09-24 DIAGNOSIS — R1030 Lower abdominal pain, unspecified: Secondary | ICD-10-CM | POA: Diagnosis present

## 2022-09-24 DIAGNOSIS — Z79899 Other long term (current) drug therapy: Secondary | ICD-10-CM | POA: Diagnosis not present

## 2022-09-24 DIAGNOSIS — K859 Acute pancreatitis without necrosis or infection, unspecified: Secondary | ICD-10-CM | POA: Diagnosis not present

## 2022-09-24 DIAGNOSIS — K449 Diaphragmatic hernia without obstruction or gangrene: Secondary | ICD-10-CM | POA: Insufficient documentation

## 2022-09-24 DIAGNOSIS — K529 Noninfective gastroenteritis and colitis, unspecified: Secondary | ICD-10-CM | POA: Diagnosis not present

## 2022-09-24 LAB — CBC WITH DIFFERENTIAL/PLATELET
Abs Immature Granulocytes: 0.06 10*3/uL (ref 0.00–0.07)
Basophils Absolute: 0.1 10*3/uL (ref 0.0–0.1)
Basophils Relative: 0 %
Eosinophils Absolute: 0 10*3/uL (ref 0.0–0.5)
Eosinophils Relative: 0 %
HCT: 38.6 % (ref 36.0–46.0)
Hemoglobin: 13.2 g/dL (ref 12.0–15.0)
Immature Granulocytes: 0 %
Lymphocytes Relative: 6 %
Lymphs Abs: 0.9 10*3/uL (ref 0.7–4.0)
MCH: 29.9 pg (ref 26.0–34.0)
MCHC: 34.2 g/dL (ref 30.0–36.0)
MCV: 87.3 fL (ref 80.0–100.0)
Monocytes Absolute: 0.2 10*3/uL (ref 0.1–1.0)
Monocytes Relative: 1 %
Neutro Abs: 14.9 10*3/uL — ABNORMAL HIGH (ref 1.7–7.7)
Neutrophils Relative %: 93 %
Platelets: 311 10*3/uL (ref 150–400)
RBC: 4.42 MIL/uL (ref 3.87–5.11)
RDW: 14.6 % (ref 11.5–15.5)
WBC: 16.2 10*3/uL — ABNORMAL HIGH (ref 4.0–10.5)
nRBC: 0 % (ref 0.0–0.2)

## 2022-09-24 LAB — COMPREHENSIVE METABOLIC PANEL
ALT: 11 U/L (ref 0–44)
AST: 18 U/L (ref 15–41)
Albumin: 4.2 g/dL (ref 3.5–5.0)
Alkaline Phosphatase: 82 U/L (ref 38–126)
Anion gap: 11 (ref 5–15)
BUN: 26 mg/dL — ABNORMAL HIGH (ref 8–23)
CO2: 24 mmol/L (ref 22–32)
Calcium: 9.5 mg/dL (ref 8.9–10.3)
Chloride: 104 mmol/L (ref 98–111)
Creatinine, Ser: 1.17 mg/dL — ABNORMAL HIGH (ref 0.44–1.00)
GFR, Estimated: 48 mL/min — ABNORMAL LOW (ref >60)
Glucose, Bld: 168 mg/dL — ABNORMAL HIGH (ref 70–99)
Potassium: 4.3 mmol/L (ref 3.5–5.1)
Sodium: 139 mmol/L (ref 135–145)
Total Bilirubin: 0.5 mg/dL (ref 0.3–1.2)
Total Protein: 8.5 g/dL — ABNORMAL HIGH (ref 6.5–8.1)

## 2022-09-24 LAB — LIPASE, BLOOD: Lipase: 137 U/L — ABNORMAL HIGH (ref 11–51)

## 2022-09-24 MED ORDER — IOHEXOL 300 MG/ML  SOLN
80.0000 mL | Freq: Once | INTRAMUSCULAR | Status: AC | PRN
  Administered 2022-09-24: 80 mL via INTRAVENOUS

## 2022-09-24 MED ORDER — HYDROMORPHONE HCL 1 MG/ML IJ SOLN
0.5000 mg | Freq: Once | INTRAMUSCULAR | Status: AC
  Administered 2022-09-24: 0.5 mg via INTRAVENOUS
  Filled 2022-09-24: qty 1

## 2022-09-24 MED ORDER — LACTATED RINGERS IV BOLUS
1000.0000 mL | Freq: Once | INTRAVENOUS | Status: AC
  Administered 2022-09-24: 1000 mL via INTRAVENOUS

## 2022-09-24 MED ORDER — LORAZEPAM 1 MG PO TABS
0.5000 mg | ORAL_TABLET | Freq: Once | ORAL | Status: AC
  Administered 2022-09-24: 0.5 mg via SUBLINGUAL
  Filled 2022-09-24: qty 1

## 2022-09-24 NOTE — ED Notes (Signed)
ED Provider at bedside. 

## 2022-09-24 NOTE — ED Triage Notes (Signed)
Pt arrives pov, slow gait with c/o ABD pain with n/v/d since 2100 yesterday

## 2022-09-24 NOTE — ED Provider Notes (Signed)
[place] EMERGENCY DEPARTMENT AT [facility] Provider Note   CSN: [id number] Arrival date & time: 09/24/22  5621     History  Chief Complaint  Patient presents with   Abdominal Pain    Media I [name] is a 77 y.o. female here with abdominal pain, nausea and vomiting since 9 PM last night.  Sudden onset in the lower abdomen.  Multiple episodes of nonbilious, nonbloody emesis.  Denies fevers or chills.  No history of abdominal surgeries. Last bowel movement was right for she came in this morning.  She is passing flatus.  Denies constipation or diarrhea. Denies dysuria, hematuria.  She says she has a history of frequent UTIs. 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  She says that this feels like another colitis flare.  Recently admitted to [facility] in July for colitis flare.  She takes mesalamine daily.  Denies other URI sick symptoms including cough, runny nose.   Abdominal Pain Associated symptoms: nausea and vomiting   Associated symptoms: no chest pain, no chills, no constipation, no cough, no diarrhea, no dysuria and no fever       Home Medications Prior to Admission medications   Medication Sig Start Date End Date Taking? Authorizing Provider  albuterol (PROVENTIL) (2.5 MG/3ML) 0.083% nebulizer solution Take 3 mLs (2.5 mg total) by nebulization every 6 (six) hours as needed for wheezing or shortness of breath. Patient not taking: Reported on 09/10/2022 05/27/22   Noemi Chapel, NP  albuterol (VENTOLIN HFA) 108 (90 Base) MCG/ACT inhaler Inhale 2 puffs into the lungs every 6 (six) hours as needed for wheezing or shortness of breath. Patient not taking: Reported on 09/10/2022 05/27/22   Noemi Chapel, NP  budesonide-formoterol [facility]) 160-4.5 MCG/ACT inhaler Inhale 2 puffs into the lungs in the morning and at bedtime. 05/27/22   Cobb, Ruby Cola, NP  Calcium Citrate-Vitamin D (CALCIUM + D PO) Take 1 tablet by mouth 2 (two) times daily.    [provider]  cetirizine (ZYRTEC) 10 MG tablet Take 1 tablet (10 mg total) by mouth daily. 04/17/22   Philip Aspen, Limmie Patricia, MD  colestipol (COLESTID) 1 g tablet Take 2 tablets (2 g total) by mouth 2 (two) times daily. 08/21/22   Hilarie Fredrickson, MD  dicyclomine (BENTYL) 20 MG tablet Take 1 tablet (20 mg total) by mouth 4 (four) times daily as needed for spasms. 08/21/22   Hilarie Fredrickson, MD  diphenoxylate-atropine (LOMOTIL) 2.5-0.025 MG tablet TAKE 2 TABLETS IN THE MORNING, AT [facility] AND AT BEDTIME AS DIRECTED 08/21/22   Hilarie Fredrickson, MD  DULoxetine (CYMBALTA) 60 MG capsule Take 1 capsule (60 mg total) by mouth 2 (two) times daily. 04/17/22   Philip Aspen, Limmie Patricia, MD  fluticasone (FLONASE) 50 MCG/ACT nasal spray Place 2 sprays into both nostrils daily. 04/17/22   Philip Aspen, Limmie Patricia, MD  furosemide (LASIX) 20 MG tablet Take 1 tablet (20 mg total) by mouth daily as needed for edema. 04/17/22   Philip Aspen, Limmie Patricia, MD  loperamide (IMODIUM) 2 MG capsule TAKE 1 CAPSULE BY MOUTH THREE TIMES A DAY AS NEEDED FOR DIARRHEA OR LOOSE STOOLS 05/10/22   Hilarie Fredrickson, MD  losartan (COZAAR) 100 MG tablet Take 1 tablet (100 mg total) by mouth daily. 04/17/22   Philip Aspen, Limmie Patricia, MD  mesalamine (LIALDA) 1.2 g EC tablet Take 4 tablets (4.8 g total) by mouth daily with breakfast. 08/21/22   Hilarie Fredrickson, MD  methylPREDNISolone (MEDROL DOSEPAK) 4 MG TBPK tablet Take as directed 05/27/22   Cobb, Ruby Cola, NP  metroNIDAZOLE (FLAGYL) 250 MG tablet Take 1 tablet (250 mg total) by mouth 3 (three) times daily. 08/21/22   Hilarie Fredrickson, MD  Multiple Vitamin (MULTIVITAMIN WITH MINERALS) TABS tablet Take 1 tablet by mouth daily.    [provider]  mupirocin ointment (BACTROBAN) 2 % Apply 1 Application topically 2 (two) times daily. 04/17/22   Philip Aspen, Limmie Patricia, MD  omeprazole (PRILOSEC) 40 MG capsule TAKE 1 CAPSULE (40 MG TOTAL) BY MOUTH IN THE MORNING, AT NOON, AND AT BEDTIME. 08/21/22    Hilarie Fredrickson, MD  ondansetron (ZOFRAN) 4 MG tablet Take 1 tablet (4 mg total) by mouth every 6 (six) hours as needed for nausea or vomiting. 08/21/22   Hilarie Fredrickson, MD  valACYclovir (VALTREX) 1000 MG tablet TAKE 1 TABLET (1,000 MG TOTAL) BY MOUTH TWICE A DAY AS NEEDED 04/17/22   Philip Aspen, Limmie Patricia, MD      Allergies    Adhesive [tape], Aspirin, Ciprofloxacin, Codeine, Demerol [meperidine hcl], Fentanyl, Lactose intolerance (gi), Latex, Other, Prednisone, and Septra [sulfamethoxazole-trimethoprim]    Review of Systems   Review of Systems  Constitutional:  Negative for chills and fever.  Respiratory:  Negative for cough and wheezing.   Cardiovascular:  Negative for chest pain.  Gastrointestinal:  Positive for abdominal pain, nausea and vomiting. Negative for blood in stool, constipation and diarrhea.  Genitourinary:  Negative for dysuria.    Physical Exam Updated Vital Signs BP (!) 195/121   Pulse 98   Temp 97.9 F (36.6 C)   Resp 18   Wt 49.9 kg   SpO2 94%   BMI 20.78 kg/m  Physical Exam Constitutional:      General: She is not in acute distress.    Appearance: She is not ill-appearing or toxic-appearing.  HENT:     Mouth/Throat:     Comments: Dry mucous membranes Cardiovascular:     Rate and Rhythm: Normal rate and regular rhythm.  Pulmonary:     Effort: Pulmonary effort is normal.     Breath sounds: No wheezing, rhonchi or rales.     Comments: Decreased breath sounds at bases Abdominal:     General: Abdomen is flat. Bowel sounds are decreased. There is no distension.     Palpations: Abdomen is soft.     Tenderness: There is abdominal tenderness in the suprapubic area.  Skin:    General: Skin is warm and dry.  Neurological:     General: No focal deficit present.     Mental Status: She is alert and oriented to person, place, and time.     ED Results / Procedures / Treatments   Labs (all labs ordered are listed, but only abnormal results are  displayed) Labs Reviewed  CBC WITH DIFFERENTIAL/PLATELET - Abnormal; Notable for the following components:      Result Value   WBC 16.2 (*)    Neutro Abs 14.9 (*)    All other components within normal limits  COMPREHENSIVE METABOLIC PANEL - Abnormal; Notable for the following components:   Glucose, Bld 168 (*)    BUN 26 (*)    Creatinine, Ser 1.17 (*)    Total Protein 8.5 (*)    GFR, Estimated 48 (*)    All other components within normal limits  LIPASE, BLOOD - Abnormal; Notable for the following components:   Lipase 137 (*)    All other components  within normal limits    EKG EKG Interpretation Date/Time:  Tuesday September 24 2022 07:20:06 EDT Ventricular Rate:  78 PR Interval:  206 QRS Duration:  99 QT Interval:  440 QTC Calculation: 502 R Axis:   24  Text Interpretation: Sinus rhythm Prolonged QT interval Confirmed by Ernie Avena ([id number]) on 09/24/2022 7:26:10 AM  Radiology CT ABDOMEN PELVIS W CONTRAST  Result Date: 09/24/2022 CLINICAL DATA:  History of inflammatory bowel disease with one day history of abdominal pain, nausea, vomiting, and diarrhea EXAM: CT ABDOMEN AND PELVIS WITH CONTRAST TECHNIQUE: Multidetector CT imaging of the abdomen and pelvis was performed using the standard protocol following bolus administration of intravenous contrast. RADIATION DOSE REDUCTION: This exam was performed according to the departmental dose-optimization program which includes automated exposure control, adjustment of the mA and/or kV according to patient size and/or use of iterative reconstruction technique. CONTRAST:  80mL OMNIPAQUE IOHEXOL 300 MG/ML  SOLN COMPARISON:  CT abdomen and pelvis dated 05/02/2021 FINDINGS: Decreased sensitivity and specificity for detailed findings due to motion artifact. Lower chest: Diffuse centrilobular emphysema. Compressive atelectasis of the left lower lobe. No pleural effusion or pneumothorax demonstrated. Partially imaged heart size is normal. Coronary  artery calcifications. Hepatobiliary: Subcentimeter hypodensities in segment 4 and 8 (2:16, 21), too small to characterize but unchanged. No intra or extrahepatic biliary ductal dilation. Normal gallbladder. Pancreas: Atrophic pancreas.  No main ductal dilation. Spleen: Normal in size without focal abnormality. Adrenals/Urinary Tract: No adrenal nodules. No suspicious renal mass, calculi or hydronephrosis. Simple/minimally complicated left lower pole cyst. No specific follow-up imaging recommended. No focal bladder wall thickening. Stomach/Bowel: Large hiatal hernia is again seen with nearly the entire stomach within the left chest. Short segment mural thickening of the hepatic flexure. No abnormal bowel dilation. Appendix is not discretely seen. Vascular/Lymphatic: Aortic atherosclerosis. No enlarged abdominal or pelvic lymph nodes. Reproductive: No adnexal masses. Other: Trace free fluid adjacent to the pancreatic tail in the left upper quadrant (2:21). No free air or fluid collection. Musculoskeletal: No acute or abnormal lytic or blastic osseous lesions. Multilevel degenerative changes of the partially imaged thoracic and lumbar spine. Severe S-shaped curvature of the thoracolumbar spine. IMPRESSION: 1. Short segment mural thickening of the hepatic flexure, which may be due to underdistention or colitis. 2. Trace free fluid adjacent to the pancreatic tail in the left upper quadrant, which is nonspecific but may be seen in the setting of acute pancreatitis. 3. Large hiatal hernia with nearly the entire stomach within the left chest. 4. Aortic Atherosclerosis (ICD10-I70.0) and Emphysema (ICD10-J43.9). Coronary artery calcifications. Assessment for potential risk factor modification, dietary therapy or pharmacologic therapy may be warranted, if clinically indicated. Electronically Signed   By: Agustin Cree M.D.   On: 09/24/2022 08:40    Procedures Procedures    Medications Ordered in ED Medications  LORazepam  (ATIVAN) tablet 0.5 mg (0.5 mg Sublingual Given 09/24/22 0741)  lactated ringers bolus 1,000 mL (1,000 mLs Intravenous New Bag/Given 09/24/22 0806)  HYDROmorphone (DILAUDID) injection 0.5 mg (0.5 mg Intravenous Given 09/24/22 0806)  iohexol (OMNIPAQUE) 300 MG/ML solution 80 mL (80 mLs Intravenous Contrast Given 09/24/22 1610)    ED Course/ Medical Decision Making/ A&P Clinical Course as of 09/24/22 0907  Tue Sep 24, 2022  0758 Lipase(!): 137 [JL]  0758 WBC(!): 16.2 [JL]    Clinical Course User Index [JL] Ernie Avena, MD  Medical Decision Making 77 year old female with history of IBD who presented with abdominal pain, nausea and vomiting likely due to acute pancreatitis.Considered SBO, gastroenteritis, pancreatitis, cholecystitis and other gallbladder pathology. On arrival, afebrile, ANO x 3 generally well-appearing but dry mucous membranes.  Quite hypertensive in the 200s/100s, but on reassessment down to 140/105.  No signs of endorgan damage, does not have any dyspnea, shortness of breath indicative of hypertensive emergency. EKG with QT interval prolongation, so treated with sublingual Ativan x 1 for nausea, 1 L LR bolus IV fluid resuscitation.  Labs remarkable for neutrophilic leukocytosis with elevated lipase 137. CT abdomen/pelvis with large hiatal hernia, trace free fluid adjacent to the pancreatic tail in the left upper quadrant that could fit with clinical scenario of pancreatitis.  Although Cr elevated, does not meet AKI criteria. No signs of end organ damage. Likely in setting of reduced PO intake and GI losses with increased pain.  Using shared decision making, patient and husband opted for outpatient management with rest, fluids, advancing diet as tolerated, nausea and pain control.  Scheduled follow-up with GI on 09/30/2022.  Discussed return precautions including intractable nausea or vomiting, worsening pain, inability to tolerate p.o. intake.  Discharged home in stable condition.  Amount and/or Complexity of Data Reviewed Labs: ordered. Decision-making details documented in ED Course. Radiology: ordered.  Risk Prescription drug management.          Final Clinical Impression(s) / ED Diagnoses Final diagnoses:  Colitis  Acute pancreatitis, unspecified complication status, unspecified pancreatitis type  Hiatal hernia    Rx / DC Orders ED Discharge Orders     None         Darral Dash, DO 09/24/22 6578    Ernie Avena, MD 09/24/22 0930

## 2022-09-24 NOTE — Discharge Instructions (Addendum)
It was a pleasure caring for you today in the emergency department.  Please go to your appointment at [facility] on Monday, September 23rd at 10:00 AM.  Start with eating a clear liquid diet, and advance as tolerated to broths, soups and other soft, easy to digest foods.  Take Zofran as needed for nausea.   Follow-up with your PCP for your hypertension.  Get plenty of rest over the next few days, drink plenty of fluids.  Please return to the emergency department for any worsening or worrisome symptoms including

## 2022-09-24 NOTE — ED Notes (Signed)
Patient transported to CT 

## 2022-09-30 ENCOUNTER — Ambulatory Visit (INDEPENDENT_AMBULATORY_CARE_PROVIDER_SITE_OTHER): Payer: Medicare Other | Admitting: Gastroenterology

## 2022-09-30 ENCOUNTER — Encounter: Payer: Self-pay | Admitting: Gastroenterology

## 2022-09-30 ENCOUNTER — Other Ambulatory Visit (INDEPENDENT_AMBULATORY_CARE_PROVIDER_SITE_OTHER): Payer: Medicare Other

## 2022-09-30 VITALS — BP 140/80 | HR 66 | Ht 60.0 in | Wt 113.0 lb

## 2022-09-30 DIAGNOSIS — K909 Intestinal malabsorption, unspecified: Secondary | ICD-10-CM

## 2022-09-30 DIAGNOSIS — Z8719 Personal history of other diseases of the digestive system: Secondary | ICD-10-CM | POA: Diagnosis not present

## 2022-09-30 DIAGNOSIS — E060 Acute thyroiditis: Secondary | ICD-10-CM

## 2022-09-30 DIAGNOSIS — R109 Unspecified abdominal pain: Secondary | ICD-10-CM | POA: Diagnosis not present

## 2022-09-30 DIAGNOSIS — E779 Disorder of glycoprotein metabolism, unspecified: Secondary | ICD-10-CM | POA: Diagnosis not present

## 2022-09-30 LAB — COMPREHENSIVE METABOLIC PANEL
ALT: 9 U/L (ref 0–35)
AST: 13 U/L (ref 0–37)
Albumin: 3.7 g/dL (ref 3.5–5.2)
Alkaline Phosphatase: 64 U/L (ref 39–117)
BUN: 21 mg/dL (ref 6–23)
CO2: 23 mEq/L (ref 19–32)
Calcium: 9.1 mg/dL (ref 8.4–10.5)
Chloride: 105 mEq/L (ref 96–112)
Creatinine, Ser: 1.14 mg/dL (ref 0.40–1.20)
GFR: 46.51 mL/min — ABNORMAL LOW (ref >60.00)
Glucose, Bld: 91 mg/dL (ref 70–99)
Potassium: 4 mEq/L (ref 3.5–5.1)
Sodium: 138 mEq/L (ref 135–145)
Total Bilirubin: 0.4 mg/dL (ref 0.2–1.2)
Total Protein: 7 g/dL (ref 6.0–8.3)

## 2022-09-30 LAB — CBC WITH DIFFERENTIAL/PLATELET
Basophils Absolute: 0.1 10*3/uL (ref 0.0–0.1)
Basophils Relative: 0.6 % (ref 0.0–3.0)
Eosinophils Absolute: 0.5 10*3/uL (ref 0.0–0.7)
Eosinophils Relative: 4.4 % (ref 0.0–5.0)
HCT: 37.7 % (ref 36.0–46.0)
Hemoglobin: 12.3 g/dL (ref 12.0–15.0)
Lymphocytes Relative: 14 % (ref 12.0–46.0)
Lymphs Abs: 1.5 10*3/uL (ref 0.7–4.0)
MCHC: 32.6 g/dL (ref 30.0–36.0)
MCV: 90.3 fl (ref 78.0–100.0)
Monocytes Absolute: 0.6 10*3/uL (ref 0.1–1.0)
Monocytes Relative: 5 % (ref 3.0–12.0)
Neutro Abs: 8.3 10*3/uL — ABNORMAL HIGH (ref 1.4–7.7)
Neutrophils Relative %: 76 % (ref 43.0–77.0)
Platelets: 355 10*3/uL (ref 150.0–400.0)
RBC: 4.18 Mil/uL (ref 3.87–5.11)
RDW: 15.2 % (ref 11.5–15.5)
WBC: 10.9 10*3/uL — ABNORMAL HIGH (ref 4.0–10.5)

## 2022-09-30 LAB — HIGH SENSITIVITY CRP: CRP, High Sensitivity: 3.98 mg/L (ref 0.000–5.000)

## 2022-09-30 LAB — TRIGLYCERIDES: Triglycerides: 126 mg/dL (ref 0.0–149.0)

## 2022-09-30 NOTE — Patient Instructions (Addendum)
Your provider has requested that you go to the basement level for lab work before leaving today. Press "B" on the elevator. The lab is located at the first door on the left as you exit the elevator.   If your blood pressure at your visit was 140/90 or greater, please contact your primary care physician to follow up on this.  _______________________________________________________  If you are age 77 or older, your body mass index should be between 23-30. Your Body mass index is 22.07 kg/m. If this is out of the aforementioned range listed, please consider follow up with your Primary Care Provider.  If you are age 29 or younger, your body mass index should be between 19-25. Your Body mass index is 22.07 kg/m. If this is out of the aformentioned range listed, please consider follow up with your Primary Care Provider.   ________________________________________________________  The [place] [facility] providers would like to encourage you to use [facility] to communicate with providers for non-urgent requests or questions.  Due to long hold times on the telephone, sending your provider a message by [facility] may be a faster and more efficient way to get a response.  Please allow 48 business hours for a response.  Please remember that this is for non-urgent requests.  _______________________________________________________  Due to recent changes in healthcare laws, you may see the results of your imaging and laboratory studies on MyChart before your provider has had a chance to review them.  We understand that in some cases there may be results that are confusing or concerning to you. Not all laboratory results come back in the same time frame and the provider may be waiting for multiple results in order to interpret others.  Please give [place] 48 hours in order for your provider to thoroughly review all the results before contacting the office for clarification of your results.   Thank you for entrusting me with your  care and choosing [facility].  Bayley Leanna Sato PA-C

## 2022-09-30 NOTE — Progress Notes (Signed)
Noted  

## 2022-09-30 NOTE — Progress Notes (Signed)
Chief Complaint: Hospital Follow up Primary GI MD: Dr. Marina Goodell  HPI: 77 year old female history of Crohn's colitis, chronic diarrhea, large symptomatic paraesophageal hernia, presents for evaluation of hospital follow-up.  Last seen 08/21/2022 by Dr. Marina Goodell. At that time she reported bouts of severe abdominal pain.  Dr. Marina Goodell recommended to continue Citrucel, Colestid, Bentyl, Lialda, Prilosec.  He also recommended a repeat trial of metronidazole 250 Mg p.o. 3 times daily for 14 days.  Patient recently seen at [facility] 09/24/2022 for abdominal pain, nausea, vomiting. Workup notable for neutrophilic leukocytosis with WBC 16.2.  CKD.  Lipase 137.  She was hypertensive 200s/100s.  EKG showed QT interval prolongation.  CT abdomen pelvis with large hiatal hernia with nearly the entire stomach within the left chest, trace free fluid adjacent to pancreatic tail in the left upper quadrant that could fit with the clinical scenario of pancreatitis.  Atrophy of the pancreas. Normal gallbladder. Short segment mural thickening of hepatic flexure. Patient was discharged with outpatient treatment of rest, fluids, advancing diet as tolerated and set up with GI appointment today.  -----TODAY------------  Today patient's husband presents with her.  She notes that the day that she went to the hospital she initially thought she was having her usual bout of severe abdominal pain but she felt it became worse/different than usual (notes it was generalized pain that radiated up her shoulder) and had profuse nausea and vomiting which prompted visit to the ED.  She states she feels much improved from her ED visit and is back at "baseline".  Denies previous episodes of pancreatitis.  Denies alcohol use.  Husband notes that patient has struggled with abdominal pain and diarrhea since 1996.  He states they have been unable to find a doctor who has been able to help and he is frustrated as she continues to get put on  medications without improvement in symptoms.  He notes she will have episodes where she is bent over in the fetal position holding her stomach and it lasts all night until he forces her to go to the hospital for an evaluation.  Patient notes the pain is worse with eating, though can occur at any time.  She notes 3-4 mushy bowel movements per day that can also be oily/greasy.  She will very rarely have a hard stool.  States she does not eat much throughout the day may eat a bagel and grilled cheese.  Denies melena/hematochezia.  Denies weight loss.   PREVIOUS GI WORKUP   Last colonoscopy November 2022 revealed changes of chronic colitis (mild) in a patchy distribution.   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   Whooping cough 2015   Whooping cough with pneumonia    2017  Past Surgical History:  Procedure Laterality Date   CATARACT EXTRACTION, BILATERAL  2018   with IOC implant    COLONOSCOPY     DEBRIDEMENT AND CLOSURE WOUND Right 10/01/2018   Procedure: Excision of right knee wound with placement of ACell and Pravena;  Surgeon: Peggye Form, DO;  Location: [facility];  Service: Plastics;  Laterality: Right;  30 min   JOINT REPLACEMENT  2020   right knee   knee fracture      NASAL FRACTURE SURGERY     needed b/c she was getting frequent sinus infections    TOTAL KNEE  ARTHROPLASTY Right 07/28/2018   Procedure: TOTAL KNEE ARTHROPLASTY;  Surgeon: Durene Romans, MD;  Location: [facility];  Service: Orthopedics;  Laterality: Right;   TOTAL KNEE ARTHROPLASTY Left 03/02/2019   Procedure: TOTAL KNEE ARTHROPLASTY;  Surgeon: Durene Romans, MD;  Location: [facility];  Service: Orthopedics;  Laterality: Left;  70 mins   UPPER GASTROINTESTINAL ENDOSCOPY      Current Outpatient Medications  Medication Sig Dispense Refill   albuterol (PROVENTIL) (2.5 MG/3ML) 0.083% nebulizer solution Take 3 mLs (2.5 mg total) by nebulization every 6 (six) hours as needed for wheezing or shortness of breath. (Patient not taking: Reported on 09/10/2022) 75 mL 5   albuterol (VENTOLIN HFA) 108 (90 Base) MCG/ACT inhaler Inhale 2 puffs into the lungs every 6 (six) hours as needed for wheezing or shortness of breath. (Patient not taking: Reported on 09/10/2022) 8 g 6   budesonide-formoterol (SYMBICORT) 160-4.5 MCG/ACT inhaler Inhale 2 puffs into the lungs in the morning and at bedtime. 1 each 12   Calcium Citrate-Vitamin D (CALCIUM + D PO) Take 1 tablet by mouth 2 (two) times daily.     cetirizine (ZYRTEC) 10 MG tablet Take 1 tablet (10 mg total) by mouth daily. 90 tablet 1   colestipol (COLESTID) 1 g tablet Take 2 tablets (2 g total) by mouth 2 (two) times daily. 180 tablet 3   dicyclomine (BENTYL) 20 MG tablet Take 1 tablet (20 mg total) by mouth 4 (four) times daily as needed for spasms. 360 tablet 3   diphenoxylate-atropine (LOMOTIL) 2.5-0.025 MG tablet TAKE 2 TABLETS IN THE MORNING, AT NOON AND AT BEDTIME AS DIRECTED 180 tablet 3   DULoxetine (CYMBALTA) 60 MG capsule Take 1 capsule (60 mg total) by mouth 2 (two) times daily. 180 capsule 1   fluticasone (FLONASE) 50 MCG/ACT nasal spray Place 2 sprays into both nostrils daily. 48 mL 1   furosemide (LASIX) 20 MG tablet Take 1 tablet (20 mg total) by mouth daily as needed for edema. 90 tablet 1   loperamide (IMODIUM) 2 MG capsule TAKE 1 CAPSULE BY MOUTH THREE  TIMES A DAY AS NEEDED FOR DIARRHEA OR LOOSE STOOLS 180 capsule 6   losartan (COZAAR) 100 MG tablet Take 1 tablet (100 mg total) by mouth daily. 90 tablet 1   mesalamine (LIALDA) 1.2 g EC tablet Take 4 tablets (4.8 g total) by mouth daily with breakfast. 360 tablet 3   methylPREDNISolone (MEDROL DOSEPAK) 4 MG TBPK tablet Take as directed 1 each 0   metroNIDAZOLE (FLAGYL) 250 MG tablet Take 1 tablet (250 mg total) by mouth 3 (three) times daily. 42 tablet 3   Multiple Vitamin (MULTIVITAMIN WITH MINERALS) TABS tablet Take 1 tablet by mouth daily.     mupirocin ointment (BACTROBAN) 2 % Apply 1 Application topically 2 (two) times daily. 30 g 3   omeprazole (PRILOSEC) 40 MG capsule TAKE 1  CAPSULE (40 MG TOTAL) BY MOUTH IN THE MORNING, AT NOON, AND AT BEDTIME. 270 capsule 3   ondansetron (ZOFRAN) 4 MG tablet Take 1 tablet (4 mg total) by mouth every 6 (six) hours as needed for nausea or vomiting. 30 tablet 1   valACYclovir (VALTREX) 1000 MG tablet TAKE 1 TABLET (1,000 MG TOTAL) BY MOUTH TWICE A DAY AS NEEDED 90 tablet 1   No current facility-administered medications for this visit.    Allergies as of 09/30/2022 - Review Complete 09/24/2022  Allergen Reaction Noted   Adhesive [tape]  10/08/2018   Aspirin  04/09/2018   Ciprofloxacin Nausea And Vomiting 07/28/2018   Codeine Nausea And Vomiting 04/09/2018   Demerol [meperidine hcl] Nausea And Vomiting 06/30/2018   Fentanyl Nausea And Vomiting 05/02/2021   Lactose intolerance (gi) Other (See Comments) 06/30/2018   Latex  04/09/2018   Other  07/28/2018   Prednisone Other (See Comments) 06/27/2021   Septra [sulfamethoxazole-trimethoprim] Nausea Only 04/09/2018    Family History  Problem Relation Age of Onset   Diabetes Father    Parkinson's disease Father    Colon cancer Other    Asthma Neg Hx    Esophageal cancer Neg Hx    Pancreatic cancer Neg Hx    Liver disease Neg Hx    Stomach cancer Neg Hx     Social History   Socioeconomic  History   Marital status: Married    Spouse name: Not on file   Number of children: 3   Years of education: Not on file   Highest education level: Not on file  Occupational History   Not on file  Tobacco Use   Smoking status: Former    Current packs/day: 0.00    Average packs/day: 1 pack/day for 30.0 years (30.0 ttl pk-yrs)    Types: Cigarettes    Start date: 75    Quit date: 47    Years since quitting: 34.7   Smokeless tobacco: Never  Vaping Use   Vaping status: Never Used  Substance and Sexual Activity   Alcohol use: Not Currently   Drug use: Never   Sexual activity: Not Currently    Birth control/protection: Post-menopausal  Other Topics Concern   Not on file  Social History Narrative   Not on file   Social Determinants of Health   Financial Resource Strain: Low Risk  (04/16/2022)   Overall Financial Resource Strain (CARDIA)    Difficulty of Paying Living Expenses: Not hard at all  Food Insecurity: No Food Insecurity (04/16/2022)   Hunger Vital Sign    Worried About Running Out of Food in the Last Year: Never true    Ran Out of Food in the Last Year: Never true  Transportation Needs: No Transportation Needs (04/16/2022)   PRAPARE - Administrator, Civil Service (Medical): No    Lack of Transportation (Non-Medical): No  Physical Activity: Insufficiently Active (04/16/2022)   Exercise Vital Sign    Days of Exercise per Week: 2 days    Minutes of Exercise per Session: 20 min  Stress: No Stress Concern Present (04/16/2022)   [employer] of Occupational Health - Occupational Stress Questionnaire    Feeling of Stress : Not at all  Social Connections: Unknown (08/04/2022)   Received from [facility] and [employer] [facility]    Help with Day-to-Day Activities: Not on file    Lonely or Isolated: Not on file  Intimate Partner Violence: Not At Risk (08/04/2022)  Received from [employer]   Abuse Screen    Feels Unsafe at  Home or Work/School: no    Feels Threatened by Someone: no    Does Anyone Try to Keep You From Having Contact with Others or Doing Things Outside Your Home?: no    Physical Signs of Abuse Present: no    Review of Systems:    Constitutional: No weight loss, fever, chills, weakness or fatigue HEENT: Eyes: No change in vision               Ears, Nose, Throat:  No change in hearing or congestion Skin: No rash or itching Cardiovascular: No chest pain, chest pressure or palpitations   Respiratory: No SOB or cough Gastrointestinal: See HPI and otherwise negative Genitourinary: No dysuria or change in urinary frequency Neurological: No headache, dizziness or syncope Musculoskeletal: No new muscle or joint pain Hematologic: No bleeding or bruising Psychiatric: No history of depression or anxiety    Physical Exam:  Vital signs: There were no vitals taken for this visit.  Constitutional: NAD, Well developed, Well nourished, alert and cooperative Head:  Normocephalic and atraumatic. Eyes:   PEERL, EOMI. No icterus. Conjunctiva pink. Respiratory: Respirations even and unlabored. Lungs clear to auscultation bilaterally.   No wheezes, crackles, or rhonchi.  Cardiovascular:  Regular rate and rhythm. No peripheral edema, cyanosis or pallor.  Gastrointestinal:  Soft, nondistended, nontender. No rebound or guarding. Normal bowel sounds. No appreciable masses or hepatomegaly. Rectal:  Not performed.  Msk:  Symmetrical without gross deformities. Without edema, no deformity or joint abnormality.  Neurologic:  Alert and  oriented x4;  grossly normal neurologically.  Skin:   Dry and intact without significant lesions or rashes. Psychiatric: Oriented to person, place and time. Demonstrates good judgement and reason without abnormal affect or behaviors.   RELEVANT LABS AND IMAGING: CBC    Component Value Date/Time   WBC 16.2 (H) 09/24/2022 0721   RBC 4.42 09/24/2022 0721   HGB 13.2 09/24/2022 0721    HCT 38.6 09/24/2022 0721   PLT 311 09/24/2022 0721   MCV 87.3 09/24/2022 0721   MCH 29.9 09/24/2022 0721   MCHC 34.2 09/24/2022 0721   RDW 14.6 09/24/2022 0721   LYMPHSABS 0.9 09/24/2022 0721   MONOABS 0.2 09/24/2022 0721   EOSABS 0.0 09/24/2022 0721   BASOSABS 0.1 09/24/2022 0721    CMP     Component Value Date/Time   NA 139 09/24/2022 0721   K 4.3 09/24/2022 0721   CL 104 09/24/2022 0721   CO2 24 09/24/2022 0721   GLUCOSE 168 (H) 09/24/2022 0721   BUN 26 (H) 09/24/2022 0721   CREATININE 1.17 (H) 09/24/2022 0721   CREATININE 1.10 (H) 11/08/2019 1048   CALCIUM 9.5 09/24/2022 0721   PROT 8.5 (H) 09/24/2022 0721   ALBUMIN 4.2 09/24/2022 0721   AST 18 09/24/2022 0721   ALT 11 09/24/2022 0721   ALKPHOS 82 09/24/2022 0721   BILITOT 0.5 09/24/2022 0721   GFRNONAA 48 (L) 09/24/2022 0721   GFRAA >60 03/04/2019 0327     Assessment/Plan:   77 year old female history of Crohn's colitis, chronic diarrhea, large symptomatic paraesophageal hernia, on Citrucel, Lialda, Prilosec, and Colestid.  Takes Colestid 2 tablets twice daily.  Notes no change in her symptoms with initiation of each of these medications.  Newly diagnosed with acute pancreatitis without history of gallstones or alcohol use.  Acute pancreatitis Clinically improved.  Lipase 137.  CT concerning for pancreatitis without alcohol use or  gallstones - Check triglycerides - Check IgG4 - With atrophy of pancreas noted on CT suspect patient could possibly have pancreatic insufficiency leading to a component of her diarrhea.  Will check pancreatic fecal elastase  Chronic diarrhea Chronic abdominal pain History of Crohn's colitis on multiple medications as above without symptom improvement.  Having 3-4 mushy stools per day with occasional oil/grease appearance and chronic abdominal pain exacerbated by eating.  Patient is on Cymbalta which she was originally put on for arthritic pain. she has had knee replacements and pain  has resolved she continues on the medication since no one has taken her off of it yet.  I think the symptoms are multifactorial.  Most likely pain with eating is due large hiatal hernia that CT notes stomach is almost entirely in the left chest. She has been seen by surgery but declines due to fear of post surgery MRSA. Could be chronic pancreatitis causing the symptoms.  Colitis also likely plays a role.  Also I think there may be gut-brain-axis component as well.  - Will complete workup for pancreatic insufficiency.  If negative can consider HIDA scan to rule out biliary dyskinesia to be thorough (less likely with this presentation but cannot rule out). - Also discussed possibility of switching Cymbalta to amitriptyline if the above workup comes back as negative - Continue present medications - Follow-up already scheduled in November with Dr. Ocie Bob, PA-C [facility] 09/30/2022, 9:59 AM  Cc: Philip Aspen, [place]*

## 2022-10-01 ENCOUNTER — Other Ambulatory Visit: Payer: Self-pay | Admitting: Internal Medicine

## 2022-10-01 ENCOUNTER — Ambulatory Visit: Payer: Medicare Other

## 2022-10-01 DIAGNOSIS — K909 Intestinal malabsorption, unspecified: Secondary | ICD-10-CM

## 2022-10-01 DIAGNOSIS — R109 Unspecified abdominal pain: Secondary | ICD-10-CM

## 2022-10-01 DIAGNOSIS — Z8719 Personal history of other diseases of the digestive system: Secondary | ICD-10-CM

## 2022-10-03 LAB — IGG 4: IgG, Subclass 4: 137 mg/dL — ABNORMAL HIGH (ref 2–96)

## 2022-10-04 ENCOUNTER — Ambulatory Visit (INDEPENDENT_AMBULATORY_CARE_PROVIDER_SITE_OTHER): Payer: Medicare Other | Admitting: Nurse Practitioner

## 2022-10-04 ENCOUNTER — Telehealth: Payer: Self-pay | Admitting: *Deleted

## 2022-10-04 ENCOUNTER — Encounter: Payer: Self-pay | Admitting: Nurse Practitioner

## 2022-10-04 VITALS — BP 126/80 | HR 61 | Ht 60.0 in | Wt 113.6 lb

## 2022-10-04 DIAGNOSIS — J432 Centrilobular emphysema: Secondary | ICD-10-CM | POA: Diagnosis not present

## 2022-10-04 DIAGNOSIS — R768 Other specified abnormal immunological findings in serum: Secondary | ICD-10-CM

## 2022-10-04 DIAGNOSIS — Z8719 Personal history of other diseases of the digestive system: Secondary | ICD-10-CM

## 2022-10-04 DIAGNOSIS — K8689 Other specified diseases of pancreas: Secondary | ICD-10-CM

## 2022-10-04 DIAGNOSIS — J984 Other disorders of lung: Secondary | ICD-10-CM

## 2022-10-04 NOTE — Assessment & Plan Note (Signed)
Exacerbation 05/2022 with acute respiratory failure. Clinically improved with steroids and initiation of maintenance inhaler. Compensated on current regimen. Encouraged to remain active. Action plan in place.  Patient Instructions  Continue Albuterol inhaler 2 puffs or 3 mL neb every 6 hours as needed for shortness of breath or wheezing.  Continue Symbicort 2 puffs Twice daily. Brush tongue and rinse mouth afterwards.  Continue flonase nasal spray 2 sprays each nostril daily  Continue zyrtec 1 tab daily  Monitor oxygen levels at home for goal >88-90%  Recommend getting your RSV vaccine at your local pharmacy    Follow up in 4 months with Dr. Francine Graven or Philis Nettle. If symptoms do not improve or worsen, please contact office for sooner follow up or seek emergency care.

## 2022-10-04 NOTE — Assessment & Plan Note (Signed)
Secondary to severe scoliosis and very large hiatal hernia.

## 2022-10-04 NOTE — Telephone Encounter (Signed)
-----   Message from Legrand Como sent at 10/04/2022  8:18 AM EDT ----- Regarding: MRCP Please set patient up for MRCP with dx code: pancreatic atrophy, history of pancreatitis, elevated IgG4

## 2022-10-04 NOTE — Telephone Encounter (Signed)
Patient has been scheduled for MRCP at [facility] location on 10/10/22 at 3 pm, 230 pm arrival, NPO 4 hours prior.  I have left a voicemail for patient to call back.
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
## 2022-10-04 NOTE — Patient Instructions (Addendum)
Continue Albuterol inhaler 2 puffs or 3 mL neb every 6 hours as needed for shortness of breath or wheezing.  Continue Symbicort 2 puffs Twice daily. Brush tongue and rinse mouth afterwards.  Continue flonase nasal spray 2 sprays each nostril daily  Continue zyrtec 1 tab daily  Monitor oxygen levels at home for goal >88-90%  Recommend getting your RSV vaccine at your local pharmacy    Follow up in 4 months with Dr. Francine Graven or Philis Nettle. If symptoms do not improve or worsen, please contact office for sooner follow up or seek emergency care.

## 2022-10-04 NOTE — Telephone Encounter (Signed)
I have spoken to patient to advise of upcoming MRCP as ordered by [facility] for further evaluation of the pancreas. Patient is given time/date/location/prep for test and verbalizes understanding of the information given to her.

## 2022-10-08 LAB — PANCREATIC ELASTASE, FECAL: Pancreatic Elastase-1, Stool: 173 ug/g — ABNORMAL LOW

## 2022-10-10 ENCOUNTER — Ambulatory Visit (HOSPITAL_BASED_OUTPATIENT_CLINIC_OR_DEPARTMENT_OTHER)
Admission: RE | Admit: 2022-10-10 | Discharge: 2022-10-10 | Disposition: A | Discharge status: Home / Self Care | Payer: Medicare Other | Source: Ambulatory Visit | Attending: Gastroenterology | Admitting: Gastroenterology

## 2022-10-10 ENCOUNTER — Other Ambulatory Visit: Payer: Self-pay | Admitting: *Deleted

## 2022-10-10 DIAGNOSIS — R768 Other specified abnormal immunological findings in serum: Secondary | ICD-10-CM | POA: Insufficient documentation

## 2022-10-10 DIAGNOSIS — Z8719 Personal history of other diseases of the digestive system: Secondary | ICD-10-CM | POA: Insufficient documentation

## 2022-10-10 DIAGNOSIS — K8689 Other specified diseases of pancreas: Secondary | ICD-10-CM | POA: Diagnosis present

## 2022-10-10 MED ORDER — GADOBUTROL 1 MMOL/ML IV SOLN
5.1000 mL | Freq: Once | INTRAVENOUS | Status: AC | PRN
  Administered 2022-10-10: 5.1 mL via INTRAVENOUS
  Filled 2022-10-10: qty 6

## 2022-10-10 MED ORDER — PANCRELIPASE (LIP-PROT-AMYL) 36000-114000 UNITS PO CPEP: ORAL_CAPSULE | ORAL | 3 refills | Status: DC

## 2022-10-11 ENCOUNTER — Telehealth: Payer: Self-pay | Admitting: Gastroenterology

## 2022-10-11 NOTE — Telephone Encounter (Signed)
Inbound call from patient stating she has not heard anything from pharmacy regarding Creon prescriptions. States she usually gets a notification stating there has been a new medication request but she has not. Patient requesting a call back. Please advise, thank you.

## 2022-10-11 NOTE — Telephone Encounter (Signed)
Contacted pharmacy and was told that Rx does not require any PA from insurance, however, the cost would be 800 dollars per month with coverage. I advised patient of this information and explained that we will fill out patient assistance forms to try to offset costs. She verbalizes understanding and knows we will call her when our portion has been completed so she can then complete her portion.

## 2022-10-15 NOTE — Telephone Encounter (Signed)
[employer] Patient Assistance forms for Creon have been completed (provider portion) and are now awaiting patient signature. I have left a voicemail for patient that she may come at her convenience to fill her portion out.

## 2022-10-17 ENCOUNTER — Encounter: Payer: Self-pay | Admitting: Internal Medicine

## 2022-10-17 ENCOUNTER — Ambulatory Visit: Payer: Medicare Other | Admitting: Internal Medicine

## 2022-10-17 VITALS — BP 140/70 | HR 65 | Temp 97.7°F | Ht 60.0 in | Wt 112.6 lb

## 2022-10-17 DIAGNOSIS — K8689 Other specified diseases of pancreas: Secondary | ICD-10-CM | POA: Diagnosis not present

## 2022-10-17 DIAGNOSIS — K5090 Crohn's disease, unspecified, without complications: Secondary | ICD-10-CM | POA: Diagnosis not present

## 2022-10-17 DIAGNOSIS — Z09 Encounter for follow-up examination after completed treatment for conditions other than malignant neoplasm: Secondary | ICD-10-CM

## 2022-10-17 DIAGNOSIS — R300 Dysuria: Secondary | ICD-10-CM

## 2022-10-17 LAB — POC URINALSYSI DIPSTICK (AUTOMATED)
Bilirubin, UA: NEGATIVE
Blood, UA: NEGATIVE
Glucose, UA: NEGATIVE
Ketones, UA: NEGATIVE
Leukocytes, UA: NEGATIVE
Nitrite, UA: NEGATIVE
Protein, UA: POSITIVE — AB
Spec Grav, UA: 1.025 (ref 1.010–1.025)
Urobilinogen, UA: 0.2 U/dL
pH, UA: 5 (ref 5.0–8.0)

## 2022-10-17 NOTE — Progress Notes (Signed)
Established Patient Office Visit     CC/Reason for Visit: ED follow-up, dysuria  HPI: [name] is a 77 y.o. female who is coming in today for the above mentioned reasons. Past Medical History is significant for: Crohn's disease and large hiatal hernia.  On September 17 she had to go to the emergency department due to severe abdominal pain and nonbilious emesis.  She was found to have an elevated lipase and a CT abdomen and pelvis with a large hiatal hernia and free fluid in the pancreatic tail and atrophy of the pancreas.  She has since followed up with GI.  She has been started on Creon but has not been able to fill the prescription due to affordability issues.  She has been complaining of some dysuria that is intermittent and would like to check her urine today.  Declines flu shot despite counseling.   Past Medical/Surgical History: Past Medical History:  Diagnosis Date   Allergies    Bruises easily    Complication of anesthesia    Crohn's disease (HCC)    GI Dr Faylene Million in [place], [place]    Deviated septum    GERD (gastroesophageal reflux disease)    Headache    occ    Hiatal hernia    HTN (hypertension)    pcp Dr Lucienne Minks  in [place], [place]    Kidney disease, chronic, stage III (GFR 30-59 ml/min) (HCC)    Neuropathy    fingers,periodically    Osteoarthritis    Pancreatic insufficiency    PONV (postoperative nausea and vomiting)    Rheumatoid arthritis (HCC)    Scoliosis    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   Whooping cough 2015   Whooping cough with pneumonia    2017    Past Surgical History:  Procedure Laterality Date   CATARACT EXTRACTION, BILATERAL  2018   with IOC implant    COLONOSCOPY     DEBRIDEMENT AND CLOSURE WOUND Right 10/01/2018   Procedure: Excision of right knee wound with placement of ACell and Pravena;  Surgeon: Peggye Form, DO;  Location: [facility];  Service: Plastics;  Laterality:  Right;  30 min   JOINT REPLACEMENT  2020   right knee   knee fracture      NASAL FRACTURE SURGERY     needed b/c she was getting frequent sinus infections    TOTAL KNEE ARTHROPLASTY Right 07/28/2018   Procedure: TOTAL KNEE ARTHROPLASTY;  Surgeon: Durene Romans, MD;  Location: [facility];  Service: Orthopedics;  Laterality: Right;   TOTAL KNEE ARTHROPLASTY Left 03/02/2019   Procedure: TOTAL KNEE ARTHROPLASTY;  Surgeon: Durene Romans, MD;  Location: [facility];  Service: Orthopedics;  Laterality: Left;  70 mins   UPPER GASTROINTESTINAL ENDOSCOPY      Social History:  reports that she quit smoking about 34 years ago. Her smoking use included cigarettes. She started smoking about 64 years ago. She has a 30 pack-year smoking history. She has never used smokeless tobacco. She reports that she does not currently use alcohol. She reports that she does not use drugs.  Allergies: Allergies  Allergen Reactions   Adhesive [Tape]     Redness and skin peeling   Aspirin     Intestinal Bleeding   Ciprofloxacin Nausea And Vomiting   Codeine Nausea And Vomiting   Demerol [Meperidine Hcl] Nausea And Vomiting   Fentanyl Nausea And Vomiting    Makes me  very sick   Lactose Intolerance (Gi) Other (See Comments)    Pt has Crohn's    Latex     Redness and skin peeling   Other     Nuts-itching in throat  Seeds-stomach issues with chrons     Prednisone Other (See Comments)    Upset stomach   Septra [Sulfamethoxazole-Trimethoprim] Nausea Only    Family History:  Family History  Problem Relation Age of Onset   Diabetes Father    Parkinson's disease Father    Colon cancer Other    Asthma Neg Hx    Esophageal cancer Neg Hx    Pancreatic cancer Neg Hx    Liver disease Neg Hx    Stomach cancer Neg Hx      Current Outpatient Medications:    albuterol (PROVENTIL) (2.5 MG/3ML) 0.083% nebulizer solution, Take 3 mLs (2.5 mg total) by nebulization every 6 (six) hours as needed for wheezing or shortness of  breath., Disp: 75 mL, Rfl: 5   albuterol (VENTOLIN HFA) 108 (90 Base) MCG/ACT inhaler, Inhale 2 puffs into the lungs every 6 (six) hours as needed for wheezing or shortness of breath., Disp: 8 g, Rfl: 6   budesonide-formoterol (SYMBICORT) 160-4.5 MCG/ACT inhaler, Inhale 2 puffs into the lungs in the morning and at bedtime., Disp: 1 each, Rfl: 12   Calcium Citrate-Vitamin D (CALCIUM + D PO), Take 1 tablet by mouth 2 (two) times daily., Disp: , Rfl:    cetirizine (ZYRTEC) 10 MG tablet, Take 1 tablet (10 mg total) by mouth daily., Disp: 90 tablet, Rfl: 1   colestipol (COLESTID) 1 g tablet, Take 2 tablets (2 g total) by mouth 2 (two) times daily., Disp: 180 tablet, Rfl: 3   dicyclomine (BENTYL) 20 MG tablet, Take 1 tablet (20 mg total) by mouth 4 (four) times daily as needed for spasms., Disp: 360 tablet, Rfl: 3   diphenoxylate-atropine (LOMOTIL) 2.5-0.025 MG tablet, TAKE 2 TABLETS IN THE MORNING, AT NOON AND AT BEDTIME AS DIRECTED, Disp: 180 tablet, Rfl: 3   DULoxetine (CYMBALTA) 60 MG capsule, Take 1 capsule (60 mg total) by mouth 2 (two) times daily., Disp: 180 capsule, Rfl: 1   fluticasone (FLONASE) 50 MCG/ACT nasal spray, Place 2 sprays into both nostrils daily., Disp: 48 mL, Rfl: 1   furosemide (LASIX) 20 MG tablet, Take 1 tablet (20 mg total) by mouth daily as needed for edema., Disp: 90 tablet, Rfl: 1   lipase/protease/amylase (CREON) 36000 UNITS CPEP capsule, Take 2 capsules by mouth with each meal, and take 1 capsule by mouth with each snack (max 8 capsules daily)., Disp: 300 capsule, Rfl: 3   loperamide (IMODIUM) 2 MG capsule, TAKE 1 CAPSULE BY MOUTH THREE TIMES A DAY AS NEEDED FOR DIARRHEA OR LOOSE STOOLS, Disp: 180 capsule, Rfl: 6   losartan (COZAAR) 100 MG tablet, Take 1 tablet (100 mg total) by mouth daily., Disp: 90 tablet, Rfl: 1   mesalamine (LIALDA) 1.2 g EC tablet, Take 4 tablets (4.8 g total) by mouth daily with breakfast., Disp: 360 tablet, Rfl: 3   Multiple Vitamin (MULTIVITAMIN  WITH MINERALS) TABS tablet, Take 1 tablet by mouth daily., Disp: , Rfl:    mupirocin ointment (BACTROBAN) 2 %, Apply 1 Application topically 2 (two) times daily., Disp: 30 g, Rfl: 3   omeprazole (PRILOSEC) 40 MG capsule, TAKE 1 CAPSULE (40 MG TOTAL) BY MOUTH IN THE MORNING, AT NOON, AND AT BEDTIME., Disp: 270 capsule, Rfl: 3   ondansetron (ZOFRAN) 4 MG tablet, Take 1  tablet (4 mg total) by mouth every 6 (six) hours as needed for nausea or vomiting., Disp: 30 tablet, Rfl: 1   valACYclovir (VALTREX) 1000 MG tablet, TAKE 1 TABLET (1,000 MG TOTAL) BY MOUTH TWICE A DAY AS NEEDED, Disp: 90 tablet, Rfl: 1  Review of Systems:  Negative unless indicated in HPI.   Physical Exam: Vitals:   10/17/22 1056  BP: (!) 140/70  Pulse: 65  Temp: 97.7 F (36.5 C)  TempSrc: Oral  SpO2: 94%  Weight: 112 lb 9.6 oz (51.1 kg)  Height: 5' (1.524 m)    Body mass index is 21.99 kg/m.   Physical Exam Vitals reviewed.  Constitutional:      Appearance: Normal appearance.  HENT:     Head: Normocephalic and atraumatic.  Eyes:     Conjunctiva/sclera: Conjunctivae normal.     Pupils: Pupils are equal, round, and reactive to light.  Skin:    General: Skin is warm and dry.  Neurological:     General: No focal deficit present.     Mental Status: She is alert and oriented to person, place, and time.  Psychiatric:        Mood and Affect: Mood normal.        Behavior: Behavior normal.        Thought Content: Thought content normal.        Judgment: Judgment normal.      Impression and Plan:  Hospital discharge follow-up  Dysuria -     POCT Urinalysis Dipstick (Automated)  Crohn's disease without complication, unspecified gastrointestinal tract location [facility])  Pancreatic insufficiency   -Emergency department and GI records reviewed in detail. -She has not yet started Creon due to affordability, is working with GI on this. -In office urine dipstick is clear, no sign of infection.  Time spent:31  minutes reviewing chart, interviewing and examining patient and formulating plan of care.     Chaya Jan, MD [facility]

## 2022-10-28 ENCOUNTER — Telehealth: Payer: Self-pay

## 2022-10-28 NOTE — Telephone Encounter (Signed)
Transition Care Management Follow-up Telephone Call Date of discharge and from where: [facility] 9/17 How have you been since you were released from the hospital? Doing well and has followed up with provider Any questions or concerns? No  Items Reviewed: Did the pt receive and understand the discharge instructions provided? Yes  Medications obtained and verified? Yes  Other? No  Any new allergies since your discharge? No  Dietary orders reviewed? No Do you have support at home? Yes     Follow up appointments reviewed: PCP Hospital f/u appt confirmed? Yes  Scheduled to see  on  @ . Specialist Hospital f/u appt confirmed? No  Scheduled to see  on  @ . Are transportation arrangements needed? No  If their condition worsens, is the pt aware to call PCP or go to the Emergency Dept.? Yes Was the patient provided with contact information for the PCP's office or ED? Yes Was to pt encouraged to call back with questions or concerns? Yes

## 2022-11-03 ENCOUNTER — Encounter (HOSPITAL_BASED_OUTPATIENT_CLINIC_OR_DEPARTMENT_OTHER): Payer: Self-pay

## 2022-11-03 ENCOUNTER — Inpatient Hospital Stay (HOSPITAL_BASED_OUTPATIENT_CLINIC_OR_DEPARTMENT_OTHER)
Admission: EM | Admit: 2022-11-03 | Discharge: 2022-11-05 | DRG: 386 | Disposition: A | Discharge status: Home / Self Care | Payer: Medicare Other | Attending: Internal Medicine | Admitting: Internal Medicine

## 2022-11-03 ENCOUNTER — Other Ambulatory Visit: Payer: Self-pay

## 2022-11-03 DIAGNOSIS — Z91040 Latex allergy status: Secondary | ICD-10-CM

## 2022-11-03 DIAGNOSIS — Z881 Allergy status to other antibiotic agents status: Secondary | ICD-10-CM

## 2022-11-03 DIAGNOSIS — R112 Nausea with vomiting, unspecified: Secondary | ICD-10-CM

## 2022-11-03 DIAGNOSIS — N183 Chronic kidney disease, stage 3 unspecified: Secondary | ICD-10-CM | POA: Diagnosis present

## 2022-11-03 DIAGNOSIS — Z820 Family history of epilepsy and other diseases of the nervous system: Secondary | ICD-10-CM

## 2022-11-03 DIAGNOSIS — K5010 Crohn's disease of large intestine without complications: Secondary | ICD-10-CM | POA: Diagnosis not present

## 2022-11-03 DIAGNOSIS — I700 Atherosclerosis of aorta: Secondary | ICD-10-CM | POA: Diagnosis present

## 2022-11-03 DIAGNOSIS — N1831 Chronic kidney disease, stage 3a: Secondary | ICD-10-CM | POA: Diagnosis present

## 2022-11-03 DIAGNOSIS — K449 Diaphragmatic hernia without obstruction or gangrene: Secondary | ICD-10-CM

## 2022-11-03 DIAGNOSIS — K529 Noninfective gastroenteritis and colitis, unspecified: Principal | ICD-10-CM | POA: Diagnosis present

## 2022-11-03 DIAGNOSIS — I129 Hypertensive chronic kidney disease with stage 1 through stage 4 chronic kidney disease, or unspecified chronic kidney disease: Secondary | ICD-10-CM | POA: Diagnosis present

## 2022-11-03 DIAGNOSIS — D72829 Elevated white blood cell count, unspecified: Secondary | ICD-10-CM | POA: Insufficient documentation

## 2022-11-03 DIAGNOSIS — K219 Gastro-esophageal reflux disease without esophagitis: Secondary | ICD-10-CM | POA: Diagnosis present

## 2022-11-03 DIAGNOSIS — I169 Hypertensive crisis, unspecified: Secondary | ICD-10-CM | POA: Diagnosis present

## 2022-11-03 DIAGNOSIS — Z888 Allergy status to other drugs, medicaments and biological substances status: Secondary | ICD-10-CM

## 2022-11-03 DIAGNOSIS — K2970 Gastritis, unspecified, without bleeding: Secondary | ICD-10-CM

## 2022-11-03 DIAGNOSIS — K589 Irritable bowel syndrome without diarrhea: Secondary | ICD-10-CM | POA: Insufficient documentation

## 2022-11-03 DIAGNOSIS — E785 Hyperlipidemia, unspecified: Secondary | ICD-10-CM | POA: Diagnosis present

## 2022-11-03 DIAGNOSIS — F411 Generalized anxiety disorder: Secondary | ICD-10-CM | POA: Insufficient documentation

## 2022-11-03 DIAGNOSIS — R1013 Epigastric pain: Secondary | ICD-10-CM

## 2022-11-03 DIAGNOSIS — Z800 Family history of malignant neoplasm of digestive organs: Secondary | ICD-10-CM

## 2022-11-03 DIAGNOSIS — Z96653 Presence of artificial knee joint, bilateral: Secondary | ICD-10-CM | POA: Diagnosis present

## 2022-11-03 DIAGNOSIS — Z79899 Other long term (current) drug therapy: Secondary | ICD-10-CM

## 2022-11-03 DIAGNOSIS — Q399 Congenital malformation of esophagus, unspecified: Secondary | ICD-10-CM

## 2022-11-03 DIAGNOSIS — E739 Lactose intolerance, unspecified: Secondary | ICD-10-CM | POA: Diagnosis present

## 2022-11-03 DIAGNOSIS — M069 Rheumatoid arthritis, unspecified: Secondary | ICD-10-CM | POA: Diagnosis present

## 2022-11-03 DIAGNOSIS — J9811 Atelectasis: Secondary | ICD-10-CM | POA: Diagnosis present

## 2022-11-03 DIAGNOSIS — K5090 Crohn's disease, unspecified, without complications: Secondary | ICD-10-CM | POA: Diagnosis present

## 2022-11-03 DIAGNOSIS — K861 Other chronic pancreatitis: Secondary | ICD-10-CM | POA: Diagnosis present

## 2022-11-03 DIAGNOSIS — K3189 Other diseases of stomach and duodenum: Secondary | ICD-10-CM | POA: Diagnosis present

## 2022-11-03 DIAGNOSIS — Z833 Family history of diabetes mellitus: Secondary | ICD-10-CM

## 2022-11-03 DIAGNOSIS — J439 Emphysema, unspecified: Secondary | ICD-10-CM | POA: Diagnosis present

## 2022-11-03 DIAGNOSIS — R109 Unspecified abdominal pain: Secondary | ICD-10-CM | POA: Diagnosis not present

## 2022-11-03 DIAGNOSIS — Z87891 Personal history of nicotine dependence: Secondary | ICD-10-CM

## 2022-11-03 DIAGNOSIS — Z7951 Long term (current) use of inhaled steroids: Secondary | ICD-10-CM

## 2022-11-03 DIAGNOSIS — Z882 Allergy status to sulfonamides status: Secondary | ICD-10-CM

## 2022-11-03 LAB — LIPASE, BLOOD: Lipase: 75 U/L — ABNORMAL HIGH (ref 11–51)

## 2022-11-03 LAB — CBC
HCT: 35.1 % — ABNORMAL LOW (ref 36.0–46.0)
Hemoglobin: 12.1 g/dL (ref 12.0–15.0)
MCH: 30.2 pg (ref 26.0–34.0)
MCHC: 34.5 g/dL (ref 30.0–36.0)
MCV: 87.5 fL (ref 80.0–100.0)
Platelets: 374 10*3/uL (ref 150–400)
RBC: 4.01 MIL/uL (ref 3.87–5.11)
RDW: 15.8 % — ABNORMAL HIGH (ref 11.5–15.5)
WBC: 17.8 10*3/uL — ABNORMAL HIGH (ref 4.0–10.5)
nRBC: 0 % (ref 0.0–0.2)

## 2022-11-03 LAB — COMPREHENSIVE METABOLIC PANEL
ALT: 10 U/L (ref 0–44)
AST: 14 U/L — ABNORMAL LOW (ref 15–41)
Albumin: 4 g/dL (ref 3.5–5.0)
Alkaline Phosphatase: 71 U/L (ref 38–126)
Anion gap: 9 (ref 5–15)
BUN: 27 mg/dL — ABNORMAL HIGH (ref 8–23)
CO2: 25 mmol/L (ref 22–32)
Calcium: 9.8 mg/dL (ref 8.9–10.3)
Chloride: 105 mmol/L (ref 98–111)
Creatinine, Ser: 1.01 mg/dL — ABNORMAL HIGH (ref 0.44–1.00)
GFR, Estimated: 57 mL/min — ABNORMAL LOW (ref >60)
Glucose, Bld: 139 mg/dL — ABNORMAL HIGH (ref 70–99)
Potassium: 4 mmol/L (ref 3.5–5.1)
Sodium: 139 mmol/L (ref 135–145)
Total Bilirubin: 0.3 mg/dL (ref 0.3–1.2)
Total Protein: 7.8 g/dL (ref 6.5–8.1)

## 2022-11-03 LAB — TROPONIN I (HIGH SENSITIVITY): Troponin I (High Sensitivity): 3 ng/L (ref <18)

## 2022-11-03 MED ORDER — ONDANSETRON HCL 4 MG/2ML IJ SOLN
4.0000 mg | Freq: Once | INTRAMUSCULAR | Status: AC
  Administered 2022-11-03: 4 mg via INTRAVENOUS
  Filled 2022-11-03: qty 2

## 2022-11-03 MED ORDER — LACTATED RINGERS IV BOLUS
1000.0000 mL | Freq: Once | INTRAVENOUS | Status: AC
  Administered 2022-11-03: 1000 mL via INTRAVENOUS

## 2022-11-03 MED ORDER — HYDROMORPHONE HCL 1 MG/ML IJ SOLN
1.0000 mg | Freq: Once | INTRAMUSCULAR | Status: AC
  Administered 2022-11-03: 1 mg via INTRAVENOUS
  Filled 2022-11-03: qty 1

## 2022-11-03 NOTE — ED Triage Notes (Signed)
Pt c/o "pancreatitis attack," onset 7pm, reports hx of same. Endorses stomach pain, shoulder pain, vomiting since 7pm, but advises diarrhea "all day, & now I feel like I have to go but I can't." Pt in obvious pain during triage, but able to speak in complete sentences

## 2022-11-03 NOTE — ED Notes (Signed)
Pt refused to give urine sample at this time stating she does not need to go at this time.

## 2022-11-03 NOTE — ED Notes (Signed)
Call placed to lab to add troponin to prior collection

## 2022-11-03 NOTE — ED Provider Notes (Signed)
[place] EMERGENCY DEPARTMENT AT [facility] Provider Note   CSN: [id number] Arrival date & time: 11/03/22  2127     History {Add pertinent medical, surgical, social history, OB history to HPI:1} No chief complaint on file.   [name] is a 77 y.o. female.  Patient with a history of pancreatitis, Crohn's colitis, hypertension, CKD, hiatal hernia, GERD, rheumatoid arthritis presenting with upper abdominal pain, nausea and vomiting.  She states this is similar to previous episodes of pancreatitis.  Pain started this evening around 7 PM in her upper abdomen shortly after eating.  Associated with multiple episodes of nonbloody nonbilious emesis.  She has diarrhea chronically which is unchanged.  No blood in her stool or blood in her emesis.  No fever.  No pain with urination or blood in the urine.  No chest pain or shortness of breath.  No pain with urination or blood in the urine.  No previous abdominal surgeries.  She is concerned she is having a pancreatitis flare again Does not know what medication she takes for her Crohn's disease.  The history is provided by the patient and a relative.       Home Medications Prior to Admission medications   Medication Sig Start Date End Date Taking? Authorizing Provider  albuterol (PROVENTIL) (2.5 MG/3ML) 0.083% nebulizer solution Take 3 mLs (2.5 mg total) by nebulization every 6 (six) hours as needed for wheezing or shortness of breath. 05/27/22   Cobb, Ruby Cola, NP  albuterol (VENTOLIN HFA) 108 (90 Base) MCG/ACT inhaler Inhale 2 puffs into the lungs every 6 (six) hours as needed for wheezing or shortness of breath. 05/27/22   Cobb, Ruby Cola, NP  budesonide-formoterol (SYMBICORT) 160-4.5 MCG/ACT inhaler Inhale 2 puffs into the lungs in the morning and at bedtime. 05/27/22   Cobb, Ruby Cola, NP  Calcium Citrate-Vitamin D (CALCIUM + D PO) Take 1 tablet by mouth 2 (two) times daily.    [provider]  cetirizine (ZYRTEC)  10 MG tablet Take 1 tablet (10 mg total) by mouth daily. 04/17/22   Philip Aspen, Limmie Patricia, MD  colestipol (COLESTID) 1 g tablet Take 2 tablets (2 g total) by mouth 2 (two) times daily. 08/21/22   Hilarie Fredrickson, MD  dicyclomine (BENTYL) 20 MG tablet Take 1 tablet (20 mg total) by mouth 4 (four) times daily as needed for spasms. 08/21/22   Hilarie Fredrickson, MD  diphenoxylate-atropine (LOMOTIL) 2.5-0.025 MG tablet TAKE 2 TABLETS IN THE MORNING, AT [facility] AND AT BEDTIME AS DIRECTED 08/21/22   Hilarie Fredrickson, MD  DULoxetine (CYMBALTA) 60 MG capsule Take 1 capsule (60 mg total) by mouth 2 (two) times daily. 04/17/22   Philip Aspen, Limmie Patricia, MD  fluticasone (FLONASE) 50 MCG/ACT nasal spray Place 2 sprays into both nostrils daily. 04/17/22   Philip Aspen, Limmie Patricia, MD  furosemide (LASIX) 20 MG tablet Take 1 tablet (20 mg total) by mouth daily as needed for edema. 04/17/22   Philip Aspen, Limmie Patricia, MD  lipase/protease/amylase (CREON) 36000 UNITS CPEP capsule Take 2 capsules by mouth with each meal, and take 1 capsule by mouth with each snack (max 8 capsules daily). 10/10/22   McMichael, Saddie Benders, PA-C  loperamide (IMODIUM) 2 MG capsule TAKE 1 CAPSULE BY MOUTH THREE TIMES A DAY AS NEEDED FOR DIARRHEA OR LOOSE STOOLS 05/10/22   Hilarie Fredrickson, MD  losartan (COZAAR) 100 MG tablet Take 1 tablet (100 mg total) by mouth daily. 04/17/22   Philip Aspen,  Limmie Patricia, MD  mesalamine (LIALDA) 1.2 g EC tablet Take 4 tablets (4.8 g total) by mouth daily with breakfast. 08/21/22   Hilarie Fredrickson, MD  Multiple Vitamin (MULTIVITAMIN WITH MINERALS) TABS tablet Take 1 tablet by mouth daily.    [provider]  mupirocin ointment (BACTROBAN) 2 % Apply 1 Application topically 2 (two) times daily. 04/17/22   Philip Aspen, Limmie Patricia, MD  omeprazole (PRILOSEC) 40 MG capsule TAKE 1 CAPSULE (40 MG TOTAL) BY MOUTH IN THE MORNING, AT NOON, AND AT BEDTIME. 08/21/22   Hilarie Fredrickson, MD  ondansetron (ZOFRAN) 4 MG tablet Take 1  tablet (4 mg total) by mouth every 6 (six) hours as needed for nausea or vomiting. 08/21/22   Hilarie Fredrickson, MD  valACYclovir (VALTREX) 1000 MG tablet TAKE 1 TABLET (1,000 MG TOTAL) BY MOUTH TWICE A DAY AS NEEDED 04/17/22   Philip Aspen, Limmie Patricia, MD      Allergies    Adhesive [tape], Aspirin, Ciprofloxacin, Codeine, Demerol [meperidine hcl], Fentanyl, Lactose intolerance (gi), Latex, Other, Prednisone, and Septra [sulfamethoxazole-trimethoprim]    Review of Systems   Review of Systems  Constitutional:  Positive for activity change and appetite change. Negative for fever.  HENT:  Negative for congestion and rhinorrhea.   Respiratory:  Negative for cough, chest tightness and shortness of breath.   Cardiovascular:  Negative for chest pain.  Gastrointestinal:  Positive for abdominal pain, nausea and vomiting.  Genitourinary:  Negative for dysuria and hematuria.  Musculoskeletal:  Negative for arthralgias and myalgias.  Skin:  Negative for rash.  Neurological:  Negative for dizziness, weakness and headaches.   all other systems are negative except as noted in the HPI and PMH.    Physical Exam Updated Vital Signs BP (!) 192/103 (BP Location: Right Arm)   Pulse 67   Resp (!) 22   SpO2 97%  Physical Exam Vitals and nursing note reviewed.  Constitutional:      General: She is not in acute distress.    Appearance: She is well-developed.     Comments: Uncomfortable  HENT:     Head: Normocephalic and atraumatic.     Mouth/Throat:     Pharynx: No oropharyngeal exudate.  Eyes:     Conjunctiva/sclera: Conjunctivae normal.     Pupils: Pupils are equal, round, and reactive to light.  Neck:     Comments: No meningismus. Cardiovascular:     Rate and Rhythm: Normal rate and regular rhythm.     Heart sounds: Normal heart sounds. No murmur heard. Pulmonary:     Effort: Pulmonary effort is normal. No respiratory distress.     Breath sounds: Normal breath sounds.  Abdominal:      Palpations: Abdomen is soft.     Tenderness: There is abdominal tenderness. There is guarding. There is no rebound.     Comments: Epigastric tenderness with voluntary guarding.  Musculoskeletal:        General: No tenderness. Normal range of motion.     Cervical back: Normal range of motion and neck supple.  Skin:    General: Skin is warm.  Neurological:     Mental Status: She is alert and oriented to person, place, and time.     Cranial Nerves: No cranial nerve deficit.     Motor: No abnormal muscle tone.     Coordination: Coordination normal.     Comments:  5/5 strength throughout. CN 2-12 intact.Equal grip strength.   Psychiatric:  Behavior: Behavior normal.     ED Results / Procedures / Treatments   Labs (all labs ordered are listed, but only abnormal results are displayed) Labs Reviewed  LIPASE, BLOOD - Abnormal; Notable for the following components:      Result Value   Lipase 75 (*)    All other components within normal limits  COMPREHENSIVE METABOLIC PANEL - Abnormal; Notable for the following components:   Glucose, Bld 139 (*)    BUN 27 (*)    Creatinine, Ser 1.01 (*)    AST 14 (*)    GFR, Estimated 57 (*)    All other components within normal limits  CBC - Abnormal; Notable for the following components:   WBC 17.8 (*)    HCT 35.1 (*)    RDW 15.8 (*)    All other components within normal limits  URINALYSIS, ROUTINE W REFLEX MICROSCOPIC  TROPONIN I (HIGH SENSITIVITY)    EKG None  Radiology No results found.  Procedures Procedures  {Document cardiac monitor, telemetry assessment procedure when appropriate:1}  Medications Ordered in ED Medications  lactated ringers bolus 1,000 mL (has no administration in time range)  HYDROmorphone (DILAUDID) injection 1 mg (has no administration in time range)  ondansetron (ZOFRAN) injection 4 mg (has no administration in time range)    ED Course/ Medical Decision Making/ A&P   {   Click here for ABCD2, HEART  and other calculatorsREFRESH Note before signing :1}                              Medical Decision Making Amount and/or Complexity of Data Reviewed Labs: ordered. Decision-making details documented in ED Course. Radiology: ordered and independent interpretation performed. Decision-making details documented in ED Course. ECG/medicine tests: ordered and independent interpretation performed. Decision-making details documented in ED Course.  Risk Prescription drug management.   Upper abdominal pain with nausea and vomiting similar to previous episodes of pancreatitis.  Hypertensive on arrival.  Abdomen soft without peritoneal signs.    Will hydrate, treat symptoms with pain and nausea medications.  Labs show minimal lipase elevation of 75   WBC 18.   ocument critical care time when appropriate:1} {Document review of labs and clinical decision tools ie heart score, Chads2Vasc2 etc:1}  {Document your independent review of radiology images, and any outside records:1} {Document your discussion with family members, caretakers, and with consultants:1} {Document social determinants of health affecting pt's care:1} {Document your decision making why or why not admission, treatments were needed:1} Final Clinical Impression(s) / ED Diagnoses Final diagnoses:  None    Rx / DC Orders ED Discharge Orders     None

## 2022-11-04 ENCOUNTER — Emergency Department (HOSPITAL_BASED_OUTPATIENT_CLINIC_OR_DEPARTMENT_OTHER): Payer: Medicare Other

## 2022-11-04 ENCOUNTER — Encounter (HOSPITAL_COMMUNITY): Payer: Self-pay

## 2022-11-04 DIAGNOSIS — M069 Rheumatoid arthritis, unspecified: Secondary | ICD-10-CM | POA: Diagnosis present

## 2022-11-04 DIAGNOSIS — R1013 Epigastric pain: Secondary | ICD-10-CM | POA: Diagnosis not present

## 2022-11-04 DIAGNOSIS — K219 Gastro-esophageal reflux disease without esophagitis: Secondary | ICD-10-CM | POA: Diagnosis present

## 2022-11-04 DIAGNOSIS — J439 Emphysema, unspecified: Secondary | ICD-10-CM

## 2022-11-04 DIAGNOSIS — N1831 Chronic kidney disease, stage 3a: Secondary | ICD-10-CM | POA: Diagnosis present

## 2022-11-04 DIAGNOSIS — Z87891 Personal history of nicotine dependence: Secondary | ICD-10-CM | POA: Diagnosis not present

## 2022-11-04 DIAGNOSIS — I129 Hypertensive chronic kidney disease with stage 1 through stage 4 chronic kidney disease, or unspecified chronic kidney disease: Secondary | ICD-10-CM | POA: Diagnosis present

## 2022-11-04 DIAGNOSIS — R109 Unspecified abdominal pain: Secondary | ICD-10-CM | POA: Diagnosis present

## 2022-11-04 DIAGNOSIS — D72829 Elevated white blood cell count, unspecified: Secondary | ICD-10-CM | POA: Insufficient documentation

## 2022-11-04 DIAGNOSIS — K5010 Crohn's disease of large intestine without complications: Secondary | ICD-10-CM | POA: Diagnosis present

## 2022-11-04 DIAGNOSIS — Z79899 Other long term (current) drug therapy: Secondary | ICD-10-CM | POA: Diagnosis not present

## 2022-11-04 DIAGNOSIS — Z888 Allergy status to other drugs, medicaments and biological substances status: Secondary | ICD-10-CM | POA: Diagnosis not present

## 2022-11-04 DIAGNOSIS — Z881 Allergy status to other antibiotic agents status: Secondary | ICD-10-CM | POA: Diagnosis not present

## 2022-11-04 DIAGNOSIS — Z96653 Presence of artificial knee joint, bilateral: Secondary | ICD-10-CM | POA: Diagnosis present

## 2022-11-04 DIAGNOSIS — K529 Noninfective gastroenteritis and colitis, unspecified: Principal | ICD-10-CM

## 2022-11-04 DIAGNOSIS — K3189 Other diseases of stomach and duodenum: Secondary | ICD-10-CM | POA: Diagnosis present

## 2022-11-04 DIAGNOSIS — Z882 Allergy status to sulfonamides status: Secondary | ICD-10-CM | POA: Diagnosis not present

## 2022-11-04 DIAGNOSIS — E785 Hyperlipidemia, unspecified: Secondary | ICD-10-CM | POA: Diagnosis present

## 2022-11-04 DIAGNOSIS — F411 Generalized anxiety disorder: Secondary | ICD-10-CM | POA: Diagnosis present

## 2022-11-04 DIAGNOSIS — I169 Hypertensive crisis, unspecified: Secondary | ICD-10-CM | POA: Diagnosis present

## 2022-11-04 DIAGNOSIS — Z91040 Latex allergy status: Secondary | ICD-10-CM | POA: Diagnosis not present

## 2022-11-04 DIAGNOSIS — K8689 Other specified diseases of pancreas: Secondary | ICD-10-CM

## 2022-11-04 DIAGNOSIS — R112 Nausea with vomiting, unspecified: Secondary | ICD-10-CM | POA: Diagnosis not present

## 2022-11-04 DIAGNOSIS — K5090 Crohn's disease, unspecified, without complications: Secondary | ICD-10-CM

## 2022-11-04 DIAGNOSIS — J449 Chronic obstructive pulmonary disease, unspecified: Secondary | ICD-10-CM | POA: Diagnosis not present

## 2022-11-04 DIAGNOSIS — Q399 Congenital malformation of esophagus, unspecified: Secondary | ICD-10-CM | POA: Diagnosis not present

## 2022-11-04 DIAGNOSIS — J9811 Atelectasis: Secondary | ICD-10-CM | POA: Diagnosis present

## 2022-11-04 DIAGNOSIS — K589 Irritable bowel syndrome without diarrhea: Secondary | ICD-10-CM | POA: Insufficient documentation

## 2022-11-04 DIAGNOSIS — K861 Other chronic pancreatitis: Secondary | ICD-10-CM | POA: Diagnosis present

## 2022-11-04 DIAGNOSIS — K449 Diaphragmatic hernia without obstruction or gangrene: Secondary | ICD-10-CM | POA: Diagnosis present

## 2022-11-04 DIAGNOSIS — I700 Atherosclerosis of aorta: Secondary | ICD-10-CM | POA: Diagnosis present

## 2022-11-04 DIAGNOSIS — Z7951 Long term (current) use of inhaled steroids: Secondary | ICD-10-CM | POA: Diagnosis not present

## 2022-11-04 DIAGNOSIS — E739 Lactose intolerance, unspecified: Secondary | ICD-10-CM | POA: Diagnosis present

## 2022-11-04 LAB — COMPREHENSIVE METABOLIC PANEL
ALT: 13 U/L (ref 0–44)
AST: 18 U/L (ref 15–41)
Albumin: 3.3 g/dL — ABNORMAL LOW (ref 3.5–5.0)
Alkaline Phosphatase: 79 U/L (ref 38–126)
Anion gap: 8 (ref 5–15)
BUN: 21 mg/dL (ref 8–23)
CO2: 24 mmol/L (ref 22–32)
Calcium: 8.6 mg/dL — ABNORMAL LOW (ref 8.9–10.3)
Chloride: 103 mmol/L (ref 98–111)
Creatinine, Ser: 1.14 mg/dL — ABNORMAL HIGH (ref 0.44–1.00)
GFR, Estimated: 50 mL/min — ABNORMAL LOW (ref >60)
Glucose, Bld: 98 mg/dL (ref 70–99)
Potassium: 3.9 mmol/L (ref 3.5–5.1)
Sodium: 135 mmol/L (ref 135–145)
Total Bilirubin: 0.6 mg/dL (ref 0.3–1.2)
Total Protein: 6.8 g/dL (ref 6.5–8.1)

## 2022-11-04 LAB — URINALYSIS, ROUTINE W REFLEX MICROSCOPIC
Bacteria, UA: NONE SEEN
Bilirubin Urine: NEGATIVE
Glucose, UA: NEGATIVE mg/dL
Hgb urine dipstick: NEGATIVE
Ketones, ur: NEGATIVE mg/dL
Leukocytes,Ua: NEGATIVE
Nitrite: NEGATIVE
Protein, ur: 30 mg/dL — AB
Specific Gravity, Urine: >1.046 — ABNORMAL HIGH (ref 1.005–1.030)
pH: 5 (ref 5.0–8.0)

## 2022-11-04 LAB — CBC
HCT: 34.5 % — ABNORMAL LOW (ref 36.0–46.0)
Hemoglobin: 11.6 g/dL — ABNORMAL LOW (ref 12.0–15.0)
MCH: 30.1 pg (ref 26.0–34.0)
MCHC: 33.6 g/dL (ref 30.0–36.0)
MCV: 89.4 fL (ref 80.0–100.0)
Platelets: 355 10*3/uL (ref 150–400)
RBC: 3.86 MIL/uL — ABNORMAL LOW (ref 3.87–5.11)
RDW: 15.9 % — ABNORMAL HIGH (ref 11.5–15.5)
WBC: 13.4 10*3/uL — ABNORMAL HIGH (ref 4.0–10.5)
nRBC: 0 % (ref 0.0–0.2)

## 2022-11-04 LAB — C-REACTIVE PROTEIN: CRP: 0.8 mg/dL (ref <1.0)

## 2022-11-04 LAB — TROPONIN I (HIGH SENSITIVITY): Troponin I (High Sensitivity): 4 ng/L (ref <18)

## 2022-11-04 LAB — SEDIMENTATION RATE: Sed Rate: 19 mm/h (ref 0–22)

## 2022-11-04 MED ORDER — SODIUM CHLORIDE 0.9% FLUSH
3.0000 mL | Freq: Two times a day (BID) | INTRAVENOUS | Status: DC
  Administered 2022-11-04: 3 mL via INTRAVENOUS

## 2022-11-04 MED ORDER — MESALAMINE 1.2 G PO TBEC
4.8000 g | DELAYED_RELEASE_TABLET | Freq: Every day | ORAL | Status: DC
Start: 2022-11-04 — End: 2022-11-05
  Administered 2022-11-04: 4.8 g via ORAL
  Filled 2022-11-04 (×2): qty 4

## 2022-11-04 MED ORDER — DICYCLOMINE HCL 20 MG PO TABS: 20.0000 mg | ORAL_TABLET | Freq: Four times a day (QID) | ORAL | Status: DC | PRN

## 2022-11-04 MED ORDER — HYDROMORPHONE HCL 1 MG/ML IJ SOLN
1.0000 mg | Freq: Once | INTRAMUSCULAR | Status: AC
  Administered 2022-11-04: 1 mg via INTRAVENOUS
  Filled 2022-11-04: qty 1

## 2022-11-04 MED ORDER — TRAZODONE HCL 50 MG PO TABS: 50.0000 mg | ORAL_TABLET | Freq: Every evening | ORAL | Status: DC | PRN

## 2022-11-04 MED ORDER — METOPROLOL TARTRATE 5 MG/5ML IV SOLN: 5.0000 mg | INTRAVENOUS | Status: DC | PRN

## 2022-11-04 MED ORDER — ACETAMINOPHEN 325 MG PO TABS: 650.0000 mg | ORAL_TABLET | Freq: Four times a day (QID) | ORAL | Status: DC | PRN

## 2022-11-04 MED ORDER — ONDANSETRON HCL 4 MG PO TABS: 4.0000 mg | ORAL_TABLET | Freq: Four times a day (QID) | ORAL | Status: DC | PRN

## 2022-11-04 MED ORDER — SENNOSIDES-DOCUSATE SODIUM 8.6-50 MG PO TABS: 1.0000 | ORAL_TABLET | Freq: Every evening | ORAL | Status: DC | PRN

## 2022-11-04 MED ORDER — HYDROMORPHONE HCL 1 MG/ML IJ SOLN: 1.0000 mg | INTRAMUSCULAR | Status: DC | PRN

## 2022-11-04 MED ORDER — PIPERACILLIN-TAZOBACTAM 3.375 G IVPB
3.3750 g | Freq: Three times a day (TID) | INTRAVENOUS | Status: DC
  Administered 2022-11-04 – 2022-11-05 (×3): 3.375 g via INTRAVENOUS
  Filled 2022-11-04 (×3): qty 50

## 2022-11-04 MED ORDER — SODIUM CHLORIDE 0.9 % IV SOLN: INTRAVENOUS | Status: AC

## 2022-11-04 MED ORDER — HYDRALAZINE HCL 20 MG/ML IJ SOLN: 10.0000 mg | Freq: Four times a day (QID) | INTRAMUSCULAR | Status: DC | PRN

## 2022-11-04 MED ORDER — IOHEXOL 300 MG/ML  SOLN
100.0000 mL | Freq: Once | INTRAMUSCULAR | Status: AC | PRN
  Administered 2022-11-04: 100 mL via INTRAVENOUS

## 2022-11-04 MED ORDER — PIPERACILLIN-TAZOBACTAM 3.375 G IVPB 30 MIN
3.3750 g | Freq: Once | INTRAVENOUS | Status: AC
  Administered 2022-11-04: 3.375 g via INTRAVENOUS
  Filled 2022-11-04: qty 50

## 2022-11-04 MED ORDER — DIPHENOXYLATE-ATROPINE 2.5-0.025 MG PO TABS
2.0000 | ORAL_TABLET | Freq: Three times a day (TID) | ORAL | Status: DC
  Administered 2022-11-04 (×3): 2 via ORAL
  Filled 2022-11-04 (×4): qty 2

## 2022-11-04 MED ORDER — HYDRALAZINE HCL 20 MG/ML IJ SOLN: 10.0000 mg | INTRAMUSCULAR | Status: DC | PRN

## 2022-11-04 MED ORDER — SODIUM CHLORIDE 0.9% FLUSH: 3.0000 mL | INTRAVENOUS | Status: DC | PRN

## 2022-11-04 MED ORDER — PANTOPRAZOLE SODIUM 40 MG PO TBEC
40.0000 mg | DELAYED_RELEASE_TABLET | Freq: Every day | ORAL | Status: DC
  Administered 2022-11-04 – 2022-11-05 (×2): 40 mg via ORAL
  Filled 2022-11-04 (×2): qty 1

## 2022-11-04 MED ORDER — GUAIFENESIN 100 MG/5ML PO LIQD: 5.0000 mL | ORAL | Status: DC | PRN

## 2022-11-04 MED ORDER — SODIUM CHLORIDE 0.9 % IV SOLN: 250.0000 mL | INTRAVENOUS | Status: AC | PRN

## 2022-11-04 MED ORDER — ALBUTEROL SULFATE (2.5 MG/3ML) 0.083% IN NEBU: 2.5000 mg | INHALATION_SOLUTION | Freq: Four times a day (QID) | RESPIRATORY_TRACT | Status: DC | PRN

## 2022-11-04 MED ORDER — ENOXAPARIN SODIUM 30 MG/0.3ML IJ SOSY
30.0000 mg | PREFILLED_SYRINGE | INTRAMUSCULAR | Status: DC
  Administered 2022-11-04: 30 mg via SUBCUTANEOUS
  Filled 2022-11-04: qty 0.3

## 2022-11-04 MED ORDER — PANCRELIPASE (LIP-PROT-AMYL) 36000-114000 UNITS PO CPEP
36000.0000 [IU] | ORAL_CAPSULE | Freq: Three times a day (TID) | ORAL | Status: DC
Start: 2022-11-04 — End: 2022-11-05
  Administered 2022-11-04: 36000 [IU] via ORAL
  Filled 2022-11-04 (×3): qty 1

## 2022-11-04 MED ORDER — MOMETASONE FURO-FORMOTEROL FUM 200-5 MCG/ACT IN AERO
2.0000 | INHALATION_SPRAY | Freq: Two times a day (BID) | RESPIRATORY_TRACT | Status: DC
  Administered 2022-11-04 (×2): 2 via RESPIRATORY_TRACT
  Filled 2022-11-04: qty 8.8

## 2022-11-04 MED ORDER — COLESTIPOL HCL 1 G PO TABS
2.0000 g | ORAL_TABLET | Freq: Two times a day (BID) | ORAL | Status: DC
Start: 2022-11-04 — End: 2022-11-05
  Administered 2022-11-04 – 2022-11-05 (×3): 2 g via ORAL
  Filled 2022-11-04 (×4): qty 2

## 2022-11-04 MED ORDER — LOSARTAN POTASSIUM 50 MG PO TABS
100.0000 mg | ORAL_TABLET | Freq: Every day | ORAL | Status: DC
  Administered 2022-11-04 – 2022-11-05 (×2): 100 mg via ORAL
  Filled 2022-11-04 (×4): qty 2

## 2022-11-04 MED ORDER — FLUTICASONE PROPIONATE 50 MCG/ACT NA SUSP
2.0000 | Freq: Every day | NASAL | Status: DC
  Administered 2022-11-04 – 2022-11-05 (×2): 2 via NASAL
  Filled 2022-11-04: qty 16

## 2022-11-04 MED ORDER — ACETAMINOPHEN 650 MG RE SUPP: 650.0000 mg | Freq: Four times a day (QID) | RECTAL | Status: DC | PRN

## 2022-11-04 MED ORDER — METOCLOPRAMIDE HCL 5 MG/ML IJ SOLN
10.0000 mg | Freq: Once | INTRAMUSCULAR | Status: AC
  Administered 2022-11-04: 10 mg via INTRAVENOUS
  Filled 2022-11-04: qty 2

## 2022-11-04 MED ORDER — HYDRALAZINE HCL 20 MG/ML IJ SOLN: 5.0000 mg | Freq: Four times a day (QID) | INTRAMUSCULAR | Status: DC | PRN

## 2022-11-04 MED ORDER — HYDRALAZINE HCL 20 MG/ML IJ SOLN
5.0000 mg | INTRAMUSCULAR | Status: AC
  Administered 2022-11-04: 5 mg via INTRAVENOUS
  Filled 2022-11-04: qty 1

## 2022-11-04 MED ORDER — IPRATROPIUM-ALBUTEROL 0.5-2.5 (3) MG/3ML IN SOLN: 3.0000 mL | RESPIRATORY_TRACT | Status: DC | PRN

## 2022-11-04 MED ORDER — ONDANSETRON HCL 4 MG/2ML IJ SOLN: 4.0000 mg | Freq: Four times a day (QID) | INTRAMUSCULAR | Status: DC | PRN

## 2022-11-04 MED ORDER — DULOXETINE HCL 60 MG PO CPEP
60.0000 mg | ORAL_CAPSULE | Freq: Two times a day (BID) | ORAL | Status: DC
  Administered 2022-11-04 – 2022-11-05 (×3): 60 mg via ORAL
  Filled 2022-11-04 (×3): qty 1

## 2022-11-04 NOTE — H&P (Signed)
History and Physical    [name] WUX:[id number] DOB: [birth year] DOA: 11/03/2022  PCP: Philip Aspen, Limmie Patricia, MD   Patient coming from: Home   Chief Complaint: Pancreatitis attack, stomach pain, shoulder pain, vomiting, diarrhea,  ED TRIAGE note:Pt c/o "pancreatitis attack," onset 7pm, reports hx of same. Endorses stomach pain, shoulder pain, vomiting since 7pm, but advises diarrhea "all day, & now I feel like I have to go but I can't." Pt in obvious pain during triage, but able to speak in complete sentences   HPI:  [name] is a 77 y.o. female with medical history significant of medical history significant for chronic diarrhea, Crohn's colitis, large symptomatic paraesophageal hernia, GERD, IBS, essential hypertension, CKD, GERD and rheumatoid arthritis presented to [facility] for evaluation for abdominal pain, nausea and vomiting.  Patient states that this is the similar episode to previous episodes of pancreatitis. Pain started this evening around 7 PM in her upper abdomen shortly after eating.  Associated with multiple episodes of nonbloody nonbilious emesis.  She has diarrhea chronically which is unchanged.  No blood in her stool or blood in her emesis.  No fever.  No pain with urination or blood in the urine.  No chest pain or shortness of breath.  No pain with urination or blood in the urine.  No previous abdominal surgeries.  She is concerned she is having a pancreatitis flare again Does not know what medication she takes for her Crohn's disease.  Of note, Patient has been seen by GI by Dr. Marina Goodell at GI clinic on 10/26/2021. 1.  Continue Citrucel, Colestid, Bentyl, Lialda, Prilosec.  All medications refilled per her request 2.  Prescribe Lomotil.  Increase to 2-3 times daily as needed.  Refilled per her request 3.  Pancreatic enzymes trial.  Given multiple samples of Zenpep.  Told to take 2 with meals and snacks.  Contact the office if this helps, as  we can prescribe.   ED Course:  At presentation to ED patient has heart rate 72, respiratory 22, blood pressure 140/102.  However blood pressure persist elevated 191/102. Lipase 75. CMP unremarkable.  Renal function at baseline.  No evidence of transaminitis. CBC showing leukocytosis 17.8 otherwise unremarkable. Troponin 3 and 4 within normal range.  CT abdomen pelvis: IMPRESSION: 1. Wall thickening and mild mucosal hyperenhancement about the right and transverse colon compatible with colitis. This has slightly progressed compared to CT 09/24/2022. 2. Large hiatal hernia with intrathoracic stomach. 3. Fecalized ileum compatible with slow transit. No dilation to suggest obstruction.  In the ED the patient was given Dilaudid 1 mg x 2 doses, LR 1 L bolus, Zofran 4 mg once, Zosyn 1 dose.  ED physician discussed case with [facility] hospitalist and patient has been accepted transfer to [facility] for further evaluation management of acute colitis.  Review of Systems:  Review of Systems  Constitutional:  Negative for chills, fever, malaise/fatigue and weight loss.  Respiratory:  Negative for cough and shortness of breath.   Cardiovascular:  Negative for chest pain.  Gastrointestinal:  Positive for abdominal pain, diarrhea, nausea and vomiting. Negative for blood in stool, constipation, heartburn and melena.  Musculoskeletal:  Negative for myalgias.  Neurological:  Negative for headaches.    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   Whooping cough 2015   Whooping cough with pneumonia    2017    Past Surgical History:  Procedure Laterality Date   CATARACT EXTRACTION, BILATERAL  2018   with IOC implant    COLONOSCOPY     DEBRIDEMENT AND CLOSURE WOUND Right 10/01/2018   Procedure: Excision of right knee wound with placement of ACell and Pravena;  Surgeon: Peggye Form, DO;  Location: [facility];  Service: Plastics;  Laterality: Right;  30 min   JOINT REPLACEMENT  2020   right knee   knee fracture      NASAL FRACTURE SURGERY     needed b/c she was getting frequent sinus infections    TOTAL KNEE ARTHROPLASTY Right 07/28/2018   Procedure: TOTAL KNEE ARTHROPLASTY;  Surgeon: Durene Romans, MD;  Location: [facility];  Service: Orthopedics;  Laterality: Right;   TOTAL KNEE ARTHROPLASTY Left 03/02/2019   Procedure: TOTAL KNEE ARTHROPLASTY;  Surgeon: Durene Romans, MD;  Location: [facility];  Service: Orthopedics;  Laterality: Left;  70 mins   UPPER GASTROINTESTINAL ENDOSCOPY       reports that she quit smoking about 34 years ago. Her smoking use included cigarettes. She started smoking about 64 years ago. She has a 30 pack-year smoking history. She has never used smokeless tobacco. She reports that she does not currently use alcohol. She reports that she does not use drugs.  Allergies  Allergen Reactions   Adhesive [Tape]     Redness and skin peeling   Aspirin     Intestinal Bleeding   Ciprofloxacin Nausea And Vomiting   Codeine Nausea And Vomiting   Demerol [Meperidine Hcl] Nausea And Vomiting   Fentanyl Nausea And Vomiting    Makes me very sick   Lactose Intolerance (Gi) Other (See Comments)    Pt has Crohn's    Latex     Redness and skin peeling   Other     Nuts-itching in throat  Seeds-stomach issues with chrons     Prednisone Other (See Comments)    Upset  stomach   Septra [Sulfamethoxazole-Trimethoprim] Nausea Only    Family History  Problem Relation Age of Onset   Diabetes Father    Parkinson's disease Father    Colon cancer Other    Asthma Neg Hx    Esophageal cancer Neg Hx    Pancreatic cancer Neg Hx    Liver disease Neg Hx    Stomach cancer Neg Hx     Prior to Admission medications   Medication Sig Start Date End Date Taking? Authorizing Provider  albuterol (PROVENTIL) (2.5 MG/3ML) 0.083% nebulizer solution Take 3 mLs (2.5 mg total) by nebulization every 6 (six) hours as needed for wheezing or shortness of breath. 05/27/22   Cobb, Ruby Cola, NP  albuterol (VENTOLIN HFA) 108 (90 Base) MCG/ACT inhaler Inhale 2 puffs into the lungs every 6 (six) hours as needed for wheezing or shortness of breath. 05/27/22  Cobb, Ruby Cola, NP  budesonide-formoterol (SYMBICORT) 160-4.5 MCG/ACT inhaler Inhale 2 puffs into the lungs in the morning and at bedtime. 05/27/22   Cobb, Ruby Cola, NP  Calcium Citrate-Vitamin D (CALCIUM + D PO) Take 1 tablet by mouth 2 (two) times daily.    [provider]  cetirizine (ZYRTEC) 10 MG tablet Take 1 tablet (10 mg total) by mouth daily. 04/17/22   Philip Aspen, Limmie Patricia, MD  colestipol (COLESTID) 1 g tablet Take 2 tablets (2 g total) by mouth 2 (two) times daily. 08/21/22   Hilarie Fredrickson, MD  dicyclomine (BENTYL) 20 MG tablet Take 1 tablet (20 mg total) by mouth 4 (four) times daily as needed for spasms. 08/21/22   Hilarie Fredrickson, MD  diphenoxylate-atropine (LOMOTIL) 2.5-0.025 MG tablet TAKE 2 TABLETS IN THE MORNING, AT [facility] AND AT BEDTIME AS DIRECTED 08/21/22   Hilarie Fredrickson, MD  DULoxetine (CYMBALTA) 60 MG capsule Take 1 capsule (60 mg total) by mouth 2 (two) times daily. 04/17/22   Philip Aspen, Limmie Patricia, MD  fluticasone (FLONASE) 50 MCG/ACT nasal spray Place 2 sprays into both nostrils daily. 04/17/22   Philip Aspen, Limmie Patricia, MD  furosemide (LASIX) 20 MG tablet Take 1 tablet (20 mg total) by  mouth daily as needed for edema. 04/17/22   Philip Aspen, Limmie Patricia, MD  lipase/protease/amylase (CREON) 36000 UNITS CPEP capsule Take 2 capsules by mouth with each meal, and take 1 capsule by mouth with each snack (max 8 capsules daily). 10/10/22   McMichael, Saddie Benders, PA-C  loperamide (IMODIUM) 2 MG capsule TAKE 1 CAPSULE BY MOUTH THREE TIMES A DAY AS NEEDED FOR DIARRHEA OR LOOSE STOOLS 05/10/22   Hilarie Fredrickson, MD  losartan (COZAAR) 100 MG tablet Take 1 tablet (100 mg total) by mouth daily. 04/17/22   Philip Aspen, Limmie Patricia, MD  mesalamine (LIALDA) 1.2 g EC tablet Take 4 tablets (4.8 g total) by mouth daily with breakfast. 08/21/22   Hilarie Fredrickson, MD  Multiple Vitamin (MULTIVITAMIN WITH MINERALS) TABS tablet Take 1 tablet by mouth daily.    [provider]  mupirocin ointment (BACTROBAN) 2 % Apply 1 Application topically 2 (two) times daily. 04/17/22   Philip Aspen, Limmie Patricia, MD  omeprazole (PRILOSEC) 40 MG capsule TAKE 1 CAPSULE (40 MG TOTAL) BY MOUTH IN THE MORNING, AT NOON, AND AT BEDTIME. 08/21/22   Hilarie Fredrickson, MD  ondansetron (ZOFRAN) 4 MG tablet Take 1 tablet (4 mg total) by mouth every 6 (six) hours as needed for nausea or vomiting. 08/21/22   Hilarie Fredrickson, MD  valACYclovir (VALTREX) 1000 MG tablet TAKE 1 TABLET (1,000 MG TOTAL) BY MOUTH TWICE A DAY AS NEEDED 04/17/22   Philip Aspen, Limmie Patricia, MD     Physical Exam: Vitals:   11/04/22 0200 11/04/22 0230 11/04/22 0410 11/04/22 0509  BP: (!) 187/94 (!) 181/96 (!) 191/102   Pulse: 92 84 85   Resp: 18  18   Temp:   97.7 F (36.5 C)   TempSrc:   Oral   SpO2: 91% 91% 100%   Weight:   51.4 kg 51.4 kg  Height:   5' (1.524 m) 5' (1.524 m)    Physical Exam Constitutional:      General: She is not in acute distress.    Appearance: She is not ill-appearing.  HENT:     Head: Normocephalic.     Mouth/Throat:     Mouth: Mucous membranes are moist.  Eyes:  Conjunctiva/sclera: Conjunctivae normal.  Cardiovascular:      Rate and Rhythm: Normal rate and regular rhythm.     Pulses: Normal pulses.     Heart sounds: Normal heart sounds.  Pulmonary:     Effort: Pulmonary effort is normal.     Breath sounds: Normal breath sounds.  Abdominal:     General: Bowel sounds are normal. There is no distension.     Palpations: Abdomen is soft.     Tenderness: There is no abdominal tenderness. There is no guarding or rebound.  Musculoskeletal:     Cervical back: Neck supple.     Right lower leg: No edema.     Left lower leg: No edema.  Skin:    Capillary Refill: Capillary refill takes less than 2 seconds.  Neurological:     Mental Status: She is oriented to person, place, and time.  Psychiatric:        Mood and Affect: Mood normal.        Thought Content: Thought content normal.      Labs on Admission: I have personally reviewed following labs and imaging studies  CBC: Recent Labs  Lab 11/03/22 2157  WBC 17.8*  HGB 12.1  HCT 35.1*  MCV 87.5  PLT 374   Basic Metabolic Panel: Recent Labs  Lab 11/03/22 2157  NA 139  K 4.0  CL 105  CO2 25  GLUCOSE 139*  BUN 27*  CREATININE 1.01*  CALCIUM 9.8   GFR: Estimated Creatinine Clearance: 33.5 mL/min (A) (by C-G formula based on SCr of 1.01 mg/dL (H)). Liver Function Tests: Recent Labs  Lab 11/03/22 2157  AST 14*  ALT 10  ALKPHOS 71  BILITOT 0.3  PROT 7.8  ALBUMIN 4.0   Recent Labs  Lab 11/03/22 2157  LIPASE 75*   No results for input(s): "AMMONIA" in the last 168 hours. Coagulation Profile: No results for input(s): "INR", "PROTIME" in the last 168 hours. Cardiac Enzymes: Recent Labs  Lab 11/03/22 2157 11/04/22 0122  TROPONINIHS 3 4   BNP (last 3 results) No results for input(s): "BNP" in the last 8760 hours. HbA1C: No results for input(s): "HGBA1C" in the last 72 hours. CBG: No results for input(s): "GLUCAP" in the last 168 hours. Lipid Profile: No results for input(s): "CHOL", "HDL", "LDLCALC", "TRIG", "CHOLHDL",  "LDLDIRECT" in the last 72 hours. Thyroid Function Tests: No results for input(s): "TSH", "T4TOTAL", "FREET4", "T3FREE", "THYROIDAB" in the last 72 hours. Anemia Panel: No results for input(s): "VITAMINB12", "FOLATE", "FERRITIN", "TIBC", "IRON", "RETICCTPCT" in the last 72 hours. Urine analysis:    Component Value Date/Time   COLORURINE YELLOW 05/03/2021 1410   APPEARANCEUR CLEAR 05/03/2021 1410   LABSPEC 1.045 (H) 05/03/2021 1410   PHURINE 5.0 05/03/2021 1410   GLUCOSEU NEGATIVE 05/03/2021 1410   GLUCOSEU NEGATIVE 02/16/2019 1534   HGBUR NEGATIVE 05/03/2021 1410   BILIRUBINUR Negative 10/17/2022 1154   KETONESUR NEGATIVE 05/03/2021 1410   PROTEINUR Positive (A) 10/17/2022 1154   PROTEINUR NEGATIVE 05/03/2021 1410   UROBILINOGEN 0.2 10/17/2022 1154   UROBILINOGEN 0.2 02/16/2019 1534   NITRITE Negative 10/17/2022 1154   NITRITE NEGATIVE 05/03/2021 1410   LEUKOCYTESUR Negative 10/17/2022 1154   LEUKOCYTESUR NEGATIVE 05/03/2021 1410    Radiological Exams on Admission: I have personally reviewed images CT ABDOMEN PELVIS W CONTRAST  Result Date: 11/04/2022 CLINICAL DATA:  Acute nonlocalized abdominal pain. Stomach pain, shoulder pain, vomiting, diarrhea, EXAM: CT ABDOMEN AND PELVIS WITH CONTRAST TECHNIQUE: Multidetector CT imaging of the abdomen and pelvis  was performed using the standard protocol following bolus administration of intravenous contrast. RADIATION DOSE REDUCTION: This exam was performed according to the departmental dose-optimization program which includes automated exposure control, adjustment of the mA and/or kV according to patient size and/or use of iterative reconstruction technique. CONTRAST:  OMNIPAQUE IOHEXOL 300 MG/ML  SOLN COMPARISON:  CT abdomen and pelvis 09/24/2022 and MRI abdomen 10/10/2022 FINDINGS: Lower chest: Large hiatal hernia with intrathoracic stomach. Emphysema. Left basilar atelectasis secondary to the hiatal hernia. Hepatobiliary: No focal liver  abnormality is seen. No gallstones, gallbladder wall thickening, or biliary dilatation. Pancreas: Pancreas is atrophic. No ductal dilation or inflammatory changes. Spleen: Unremarkable. Adrenals/Urinary Tract: Stable adrenal glands and kidneys. Unremarkable bladder. No urinary calculi or hydronephrosis. Stomach/Bowel: Wall thickening and mild mucosal hyperenhancement about the transverse colon. Fecalized distal ileum compatible with slow transit. The appendix is not discretely seen. No evidence of obstruction. Vascular/Lymphatic: Calcified tortuous aorta without aneurysm. No lymphadenopathy. Reproductive: Uterus and bilateral adnexa are unremarkable. Other: Small amount of free fluid in the abdomen. No free intraperitoneal air. Musculoskeletal: S shaped thoracolumbar scoliosis. No acute fracture. IMPRESSION: 1. Wall thickening and mild mucosal hyperenhancement about the right and transverse colon compatible with colitis. This has slightly progressed compared to CT 09/24/2022. 2. Large hiatal hernia with intrathoracic stomach. 3. Fecalized ileum compatible with slow transit. No dilation to suggest obstruction. Aortic Atherosclerosis (ICD10-I70.0) and Emphysema (ICD10-J43.9). Electronically Signed   By: Minerva Fester M.D.   On: 11/04/2022 01:58    EKG: My personal interpretation of EKG shows:  EKG showing normal sinus rhythm, heart rate 72, there is no ST-T wave abnormality.   Assessment/Plan: Principal Problem:   Acute colitis Active Problems:   Hypertensive crisis   Chronic diarrhea   Hyperlipidemia   CKD (chronic kidney disease) stage 3, GFR 30-59 ml/min (HCC)   GERD (gastroesophageal reflux disease)   Crohn's disease (HCC)   Hiatal hernia   Emphysema lung (HCC)   IBS (irritable bowel syndrome)   Leukocytosis   Generalized anxiety disorder    Assessment and Plan: Acute colitis Reactive leukocytosis -Patient presenting to the emergency department with complaining of nausea, vomiting, and  diarrhea that started around 7 PM last night 11/03/2022.  Patient reported nonbilious vomiting and unable to tolerate any oral food at home.  However she has chronic diarrhea at baseline. -CT abdomen pelvis showing Wall thickening and mild mucosal hyperenhancement about the right and transverse colon compatible with colitis. This has slightly progressed compared to CT 09/24/2022. -At presentation to ED patient is hemodynamically stable.  Afebrile.  CBC showing leukocytosis 17.8. - Per chart review patient follows [facility] Dr. Marina Goodell for management of Crohn's colitis, chronic diarrhea and symptomatic paraesophageal hernia.  Colonoscopy from November 2022 showed normal ileum, colon revealed mild patchy colitis.  Biopsy confirmed mildly active acute colitis consistent with IBD.  At home patient is currently on Citrucel, Colestid, Bentyl, Lialda, Prilosec.  -Currently treating patient for acute colitis with IV Zosyn.  Pharmacy consulted for dosing of Zosyn. -Continue Bentyl 20 mg 4 times daily. - Continue Dilaudid 1 mg every 3 hour as needed moderate and severe pain.  Continue Zofran as needed. -Starting full liquid diet.  Advance diet as patient tolerates. -Checking CRP, ESR and fecal Cal protein.  Checking GI panel and C. difficile panel.  Holding of any Imodium at this time until C. difficile panel rules out. -Continue monitor fever curve and WBC. -Unsure acute colitis secondary to Crohn's prior versus GI origin. -Informed on-call [place]  [facility] Dr. Concha Se and requesting for evaluation in the daytime.  History of chronic pancreatitis with pancreatic insufficiency - Continue Creon 3 times daily with meals.  History of Crohn's disease -Continue Masella mine 4.9 mg with breakfast.  Chronic diarrhea -Continue colestipol 2 mg twice daily - Continue Creon 3 times daily with meals.  IBS-diarrhea predominance -Continue Bentyl 20 mg every 4 times daily as needed. -Holding Imodium until C. difficile  rules out.  CKD 3 A -Creatinine 1.01.  Renal function at baseline.  GFR in between 48-55.  Continue to monitor renal function, urine output and effort intoxication.  Essential hypertension Hypertensive crisis -In the ED patient systolic blood pressure remained in between 192 to 203 and diastolic blood pressure remained between 96 to 103.  Heart rate in between 67-84.  Patient reported due to nausea she missed today's losartan dose.  Elevated blood pressure could be from pain response. -Troponin within normal range and EKG showed normal sinus rhythm heart rate 72 without any ST-T wave abnormality. - As patient nausea has been controlled resuming losartan 100 mg daily. - Continue hydralazine 10 mg every 6 as needed for SBP>160 or DBP>110. -Continue cardiac monitoring  GERD -Continue Protonix  Emphysematous lung disease -Stable - Continue albuterol nebulizer as needed, Flonase nasal spray, Dulera twice daily.  Generalized anxiety disorder -Continue Cymbalta milligram twice daily  DVT prophylaxis:  Lovenox Code Status:  Full Code.  Verified with patient Diet: Liquid diet.  Advance diet as patient tolerates Family Communication: No family member at bedside now. Disposition Plan: Pending clinical improvement of nausea, abdominal pain and vomiting.Marland Kitchen  Pending GI and C. difficile panel.  Pending GI evaluation. Consults: [facility] Admission status:   Observation, Telemetry bed  Severity of Illness: The appropriate patient status for this patient is OBSERVATION. Observation status is judged to be reasonable and necessary in order to provide the required intensity of service to ensure the patient's safety. The patient's presenting symptoms, physical exam findings, and initial radiographic and laboratory data in the context of their medical condition is felt to place them at decreased risk for further clinical deterioration. Furthermore, it is anticipated that the patient will be  medically stable for discharge from the hospital within 2 midnights of admission.     Tereasa Coop, MD [facility]  How to contact the [facility] Attending or Consulting provider 7A - 7P or covering provider during after hours 7P -7A, for this patient.  Check the care team in [facility] and look for a) attending/consulting [facility] provider listed and b) the [facility] team listed Log into [email or web address] and use [place]'s universal password to access. If you do not have the password, please contact the hospital operator. Locate the [facility] provider you are looking for under [facility] and page to a number that you can be directly reached. If you still have difficulty reaching the provider, please page the [facility] (Director on Call) for the Hospitalists listed on amion for assistance.  11/04/2022, 5:25 AM

## 2022-11-04 NOTE — Telephone Encounter (Signed)
Patient has signed her portion of [employer] assistance forms and appropriate paperwork has been faxed to [employer] patient assistance as of 10/17/22.

## 2022-11-04 NOTE — Plan of Care (Signed)
  Problem: Education: Goal: Knowledge of General Education information will improve Description: Including pain rating scale, medication(s)/side effects and non-pharmacologic comfort measures Outcome: Progressing   Problem: Activity: Goal: Risk for activity intolerance will decrease Outcome: Progressing   

## 2022-11-04 NOTE — Progress Notes (Signed)
New Admission Note:   Arrival Method: Arrived from [facility] ED via [facility] Mental Orientation: Alert and oriented x4 Telemetry:  N/A Assessment: Completed Skin: Intact IV: MSL-Rt AC Pain: 0/10 Tubes: N/A Safety Measures: Safety Fall Prevention Plan has been discussed.  Admission: Completed Orientation: Patient has been oriented to the room, unit and staff.  Family: None at bedside  Orders have been reviewed and implemented. Will continue to monitor the patient. Call light has been placed within reach and bed alarm has been activated.   Ashya Nicolaisen [employer], RN-BC Phone number: [phone]

## 2022-11-04 NOTE — H&P (View-Only) (Signed)
Consultation  Referring Provider: TRH/Amin Primary Care Physician:  Philip Aspen, Limmie Patricia, MD Primary Gastroenterologist:  Dr.Perry  Reason for Consultation: Recurrent abdominal pain, nausea vomiting and chronic diarrhea  HPI: [name] is a 77 y.o. female, known to Dr. Marina Goodell with history of longstanding mild Crohn's colitis, GERD, hypertension, osteoarthritis, history of rheumatoid arthritis and significant scoliosis. He presented to the emergency room yesterday with complaints of abdominal pain acute onset and severe across the mid abdomen with radiation to the left shoulder, associated with nausea and vomiting.  This occurred immediately on starting to eat.  She also had numerous episodes of diarrhea yesterday which since has completely stopped. Says she has been having similar but less severe episodes of this pain almost monthly over the past several months.  The initial episode occurred several months ago while she was in [place], and did require an overnight admission there.  She had an episode in September 2027 which took her to the emergency room. She says these episodes are very intense, and will last multiple hours until she is given an IV analgesic then will abate and she feels okay.  In between these episodes she says she always feels up quickly, she has not been having any episodes of nausea or vomiting or regurgitation in between these episodes.  She last had colonoscopy in November 2022 with a normal-appearing terminal ileum, there was loss of haustral features, and mild patchy erythema throughout the colon.  Biopsies from the right colon showed mildly active Crohn's colitis, biopsies from the transverse colon mildly active Crohn's colitis and left colon biopsy showed inactive Crohn's colitis. She has been on Lialda 4.8 g daily, Colestid 2 g twice daily and Bentyl usually 20 mg p.o. 4 times daily. She had recently been seen in the office by Cira Servant on  10/04/2022 after that most recent ER visit. CT scan at that time had shown a short segment of mural wall thickening in the hepatic flexure, diffuse COPD, compressive atelectasis of the left lower lobe, normal-appearing gallbladder, atrophic pancreas with no main ductal dilation large hiatal hernia with nearly the entire stomach within the left chest, and trace amount of free fluid adjacent to the pancreatic tail .  At that time lipase was elevated at 167. 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  She was to start a trial of Zenpep, currently awaiting assistance program as it was going to be $800 a month.  She says she has been on pancreatic enzymes in the past without any improvement in symptoms.  Yesterday after arrival to the ER she had CT again with contrast which showed a large hiatal hernia with intrathoracic stomach, atrophic appearing pancreas again noted, wall thickening of the transverse colon and some fecalization of the distal ileum as well as a tortuous calcified aorta.  Labs show WBC of 17.8/hemoglobin 12.1/hematocrit  35 BUN 27/creatinine 1 LFTs within normal limits Lipase 75.  After she was able to have IV analgesic her pain resolved and has not recurred.  She says she feels fine currently After the multiple  episodes of diarrhea yesterday she also has not had any further stools.  She has several pending labs including fecal calprotectin, C. difficile quick screen, norovirus screen and GI path panel.  He had been seen by general surgery about 2 years ago/Dr. Stechschulte-regarding her huge hiatal hernia and at that time had sided against surgery as she states that every time she has had a surgery she has developed a severe staph infection.   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   Whooping cough 2015   Whooping cough with pneumonia    2017    Past Surgical History:  Procedure Laterality Date   CATARACT EXTRACTION, BILATERAL  2018   with IOC implant    COLONOSCOPY     DEBRIDEMENT AND CLOSURE WOUND Right 10/01/2018   Procedure: Excision of right knee wound with placement of ACell and Pravena;  Surgeon: Peggye Form, DO;  Location: [facility];  Service: Plastics;  Laterality: Right;  30 min   JOINT REPLACEMENT  2020   right knee   knee fracture      NASAL FRACTURE SURGERY     needed b/c she was getting frequent sinus infections    TOTAL KNEE ARTHROPLASTY Right 07/28/2018   Procedure: TOTAL KNEE ARTHROPLASTY;  Surgeon: Durene Romans, MD;  Location: [facility];  Service: Orthopedics;  Laterality: Right;   TOTAL KNEE ARTHROPLASTY Left 03/02/2019   Procedure: TOTAL KNEE ARTHROPLASTY;  Surgeon:  Durene Romans, MD;  Location: [facility];  Service: Orthopedics;  Laterality: Left;  70 mins   UPPER GASTROINTESTINAL ENDOSCOPY      Prior to Admission medications   Medication Sig Start Date End Date Taking? Authorizing Provider  albuterol (PROVENTIL) (2.5 MG/3ML) 0.083% nebulizer solution Take 3 mLs (2.5 mg total) by nebulization every 6 (six) hours as needed for wheezing or shortness of breath. 05/27/22  Yes Cobb, Ruby Cola, NP  albuterol (VENTOLIN HFA) 108 (90 Base) MCG/ACT inhaler Inhale 2 puffs into the lungs every 6 (six) hours as needed for wheezing or shortness of breath. 05/27/22  Yes Cobb, Ruby Cola, NP  budesonide-formoterol (SYMBICORT) 160-4.5 MCG/ACT inhaler Inhale 2 puffs into the lungs in the morning and at bedtime. 05/27/22  Yes Cobb, Ruby Cola, NP  Calcium Citrate-Vitamin D (CALCIUM + D PO) Take 1 tablet by mouth 2 (two) times daily.   Yes [provider]  cetirizine (ZYRTEC) 10 MG tablet Take 1 tablet (10 mg total) by mouth daily. 04/17/22  Yes Philip Aspen, Limmie Patricia, MD  colestipol (COLESTID)  1 g tablet Take 2 tablets (2 g total) by mouth 2 (two) times daily. 08/21/22  Yes Hilarie Fredrickson, MD  dicyclomine (BENTYL) 20 MG tablet Take 1 tablet (20 mg total) by mouth 4 (four) times daily as needed for spasms. 08/21/22  Yes Hilarie Fredrickson, MD  diphenoxylate-atropine (LOMOTIL) 2.5-0.025 MG tablet TAKE 2 TABLETS IN THE MORNING, AT [facility] AND AT BEDTIME AS DIRECTED 08/21/22  Yes Hilarie Fredrickson, MD  DULoxetine (CYMBALTA) 60 MG capsule Take 1 capsule (60 mg total) by mouth 2 (two) times daily. 04/17/22  Yes Philip Aspen, Limmie Patricia, MD  fluticasone [facility]) 50 MCG/ACT nasal spray Place 2 sprays into both nostrils daily. 04/17/22  Yes Philip Aspen, Limmie Patricia, MD  furosemide (LASIX) 20 MG tablet Take 1 tablet (20 mg total) by mouth daily as needed for edema. 04/17/22  Yes Philip Aspen, Limmie Patricia, MD  loperamide (IMODIUM) 2 MG capsule TAKE 1 CAPSULE BY MOUTH THREE TIMES A DAY AS  NEEDED FOR DIARRHEA OR LOOSE STOOLS 05/10/22  Yes Hilarie Fredrickson, MD  losartan (COZAAR) 100 MG tablet Take 1 tablet (100 mg total) by mouth daily. 04/17/22  Yes Philip Aspen, Limmie Patricia, MD  mesalamine (LIALDA) 1.2 g EC tablet Take 4 tablets (4.8 g total) by mouth daily with breakfast. 08/21/22  Yes Hilarie Fredrickson, MD  Multiple Vitamin (MULTIVITAMIN WITH MINERALS) TABS tablet Take 1 tablet by mouth daily.   Yes [provider]  omeprazole (PRILOSEC) 40 MG capsule TAKE 1 CAPSULE (40 MG TOTAL) BY MOUTH IN THE MORNING, AT NOON, AND AT BEDTIME. 08/21/22  Yes Hilarie Fredrickson, MD  valACYclovir (VALTREX) 1000 MG tablet TAKE 1 TABLET (1,000 MG TOTAL) BY MOUTH TWICE A DAY AS NEEDED 04/17/22  Yes Philip Aspen, Limmie Patricia, MD  lipase/protease/amylase (CREON) 36000 UNITS CPEP capsule Take 2 capsules by mouth with each meal, and take 1 capsule by mouth with each snack (max 8 capsules daily). Patient not taking: Reported on 11/04/2022 10/10/22   Legrand Como, PA-C    Current Facility-Administered Medications  Medication Dose Route Frequency Provider Last Rate Last Admin   0.9 %  sodium chloride infusion  250 mL Intravenous PRN Janalyn Shy, Subrina, MD       0.9 %  sodium chloride infusion   Intravenous Continuous Nelson Chimes, Ankit C, MD 50 mL/hr at 11/04/22 0859 New Bag at 11/04/22 0859   acetaminophen (TYLENOL) tablet 650 mg  650 mg Oral Q6H PRN Tereasa Coop, MD       Or   acetaminophen (TYLENOL) suppository 650 mg  650 mg Rectal Q6H PRN Janalyn Shy, Subrina, MD       colestipol (COLESTID) tablet 2 g  2 g Oral BID Janalyn Shy, Subrina, MD   2 g at 11/04/22 0855   dicyclomine (BENTYL) tablet 20 mg  20 mg Oral QID PRN Janalyn Shy, Subrina, MD       diphenoxylate-atropine (LOMOTIL) 2.5-0.025 MG per tablet 2 tablet  2 tablet Oral TID Tereasa Coop, MD   2 tablet at 11/04/22 0854   DULoxetine (CYMBALTA) DR capsule 60 mg  60 mg Oral BID Janalyn Shy, Subrina, MD   60 mg at 11/04/22 0854   enoxaparin (LOVENOX) injection 30 mg  30 mg  Subcutaneous Q24H Sundil, Subrina, MD       fluticasone (FLONASE) 50 MCG/ACT nasal spray 2 spray  2 spray Each Nare Daily Sundil, Subrina, MD       guaiFENesin (ROBITUSSIN) 100 MG/5ML liquid 5 mL  5 mL Oral Q4H PRN  Amin, Ankit C, MD       hydrALAZINE (APRESOLINE) injection 10 mg  10 mg Intravenous Q4H PRN Amin, Ankit C, MD       HYDROmorphone (DILAUDID) injection 1 mg  1 mg Intravenous Q3H PRN Sundil, Subrina, MD       ipratropium-albuterol (DUONEB) 0.5-2.5 (3) MG/3ML nebulizer solution 3 mL  3 mL Nebulization Q4H PRN Amin, Ankit C, MD       lipase/protease/amylase (CREON) capsule 36,000 Units  36,000 Units Oral TID AC Sundil, Subrina, MD   36,000 Units at 11/04/22 0855   losartan (COZAAR) tablet 100 mg  100 mg Oral Daily Sundil, Subrina, MD   100 mg at 11/04/22 0520   mesalamine (LIALDA) EC tablet 4.8 g  4.8 g Oral Q breakfast Sundil, Subrina, MD   4.8 g at 11/04/22 0854   metoprolol tartrate (LOPRESSOR) injection 5 mg  5 mg Intravenous Q4H PRN Amin, Ankit C, MD       mometasone-formoterol (DULERA) 200-5 MCG/ACT inhaler 2 puff  2 puff Inhalation BID Janalyn Shy, Subrina, MD   2 puff at 11/04/22 0830   ondansetron (ZOFRAN) injection 4 mg  4 mg Intravenous Q6H PRN Janalyn Shy, Subrina, MD       ondansetron (ZOFRAN) tablet 4 mg  4 mg Oral Q6H PRN Janalyn Shy, Subrina, MD       pantoprazole (PROTONIX) EC tablet 40 mg  40 mg Oral Daily Sundil, Subrina, MD   40 mg at 11/04/22 0854   piperacillin-tazobactam (ZOSYN) IVPB 3.375 g  3.375 g Intravenous Q8H Arabella Merles, RPH       senna-docusate (Senokot-S) tablet 1 tablet  1 tablet Oral QHS PRN Amin, Ankit C, MD       sodium chloride flush (NS) 0.9 % injection 3 mL  3 mL Intravenous Q12H Sundil, Subrina, MD       sodium chloride flush (NS) 0.9 % injection 3 mL  3 mL Intravenous PRN Janalyn Shy, Subrina, MD       traZODone (DESYREL) tablet 50 mg  50 mg Oral QHS PRN Amin, Ankit C, MD        Allergies as of 11/03/2022 - Review Complete 11/03/2022  Allergen Reaction Noted    Adhesive [tape]  10/08/2018   Aspirin  04/09/2018   Ciprofloxacin Nausea And Vomiting 07/28/2018   Codeine Nausea And Vomiting 04/09/2018   Demerol [meperidine hcl] Nausea And Vomiting 06/30/2018   Fentanyl Nausea And Vomiting 05/02/2021   Lactose intolerance (gi) Other (See Comments) 06/30/2018   Latex  04/09/2018   Other  07/28/2018   Prednisone Other (See Comments) 06/27/2021   Septra [sulfamethoxazole-trimethoprim] Nausea Only 04/09/2018    Family History  Problem Relation Age of Onset   Diabetes Father    Parkinson's disease Father    Colon cancer Other    Asthma Neg Hx    Esophageal cancer Neg Hx    Pancreatic cancer Neg Hx    Liver disease Neg Hx    Stomach cancer Neg Hx     Social History   Socioeconomic History   Marital status: Married    Spouse name: Not on file   Number of children: 3   Years of education: Not on file   Highest education level: Not on file  Occupational History   Not on file  Tobacco Use   Smoking status: Former    Current packs/day: 0.00    Average packs/day: 1 pack/day for 30.0 years (30.0 ttl pk-yrs)    Types: Cigarettes    Start date:  1960    Quit date: 69    Years since quitting: 34.8   Smokeless tobacco: Never  Vaping Use   Vaping status: Never Used  Substance and Sexual Activity   Alcohol use: Not Currently   Drug use: Never   Sexual activity: Not Currently    Birth control/protection: Post-menopausal  Other Topics Concern   Not on file  Social History Narrative   Not on file   Social Determinants of Health   Financial Resource Strain: Low Risk  (04/16/2022)   Overall Financial Resource Strain (CARDIA)    Difficulty of Paying Living Expenses: Not hard at all  Food Insecurity: No Food Insecurity (11/04/2022)   Hunger Vital Sign    Worried About Running Out of Food in the Last Year: Never true    Ran Out of Food in the Last Year: Never true  Transportation Needs: No Transportation Needs (11/04/2022)   PRAPARE -  Administrator, Civil Service (Medical): No    Lack of Transportation (Non-Medical): No  Physical Activity: Insufficiently Active (04/16/2022)   Exercise Vital Sign    Days of Exercise per Week: 2 days    Minutes of Exercise per Session: 20 min  Stress: No Stress Concern Present (04/16/2022)   [employer] of Occupational Health - Occupational Stress Questionnaire    Feeling of Stress : Not at all  Social Connections: Unknown (08/04/2022)   Received from [employer]   Family and [employer] Support    Help with Day-to-Day Activities: Not on file    Lonely or Isolated: Not on file  Intimate Partner Violence: Not At Risk (11/04/2022)   Humiliation, Afraid, Rape, and Kick questionnaire    Fear of Current or Ex-Partner: No    Emotionally Abused: No    Physically Abused: No    Sexually Abused: No    Review of Systems: Pertinent positive and negative review of systems were noted in the above HPI section.  All other review of systems was otherwise negative.   Physical Exam: Vital signs in last 24 hours: Temp:  [97.7 F (36.5 C)-98.8 F (37.1 C)] 98.8 F (37.1 C) (10/28 0902) Pulse Rate:  [67-111] 101 (10/28 0902) Resp:  [14-22] 16 (10/28 0902) BP: (139-217)/(69-108) 142/69 (10/28 0902) SpO2:  [89 %-100 %] 95 % (10/28 0902) FiO2 (%):  [28 %] 28 % (10/28 0056) Weight:  [51.4 kg] 51.4 kg (10/28 0509) Last BM Date : 11/04/22 General:   Alert,  Well-developed, well-nourished, generally white female pleasant and cooperative in NAD, on O2 at 2 L Head:  Normocephalic and atraumatic. Eyes:  Sclera clear, no icterus.   Conjunctiva pink. Ears:  Normal auditory acuity. Nose:  No deformity, discharge,  or lesions. Mouth:  No deformity or lesions.   Neck:  Supple; no masses or thyromegaly. Lungs: Decreased breath sounds bilaterally   no wheezes, crackles, or rhonchi.  Significant thoracic scoliosis  Heart:  Regular rate and rhythm; no murmurs, clicks, rubs,  or  gallops. Abdomen:  Soft,nontender, BS active,nonpalp mass or hsm.   Rectal: Not done Msk:  Symmetrical without gross deformities. . Pulses:  Normal pulses noted. Extremities:  Without clubbing or edema. Neurologic:  Alert and  oriented x4;  grossly normal neurologically. Skin:  Intact without significant lesions or rashes.. Psych:  Alert and cooperative. Normal mood and affect.  Intake/Output from previous day: 10/27 0701 - 10/28 0700 In: 1154.7 [P.O.:60; IV Piggyback:1094.7] Out: -  Intake/Output this shift: No intake/output data recorded.  Lab Results:  Recent Labs    11/03/22 2157  WBC 17.8*  HGB 12.1  HCT 35.1*  PLT 374   BMET Recent Labs    11/03/22 2157  NA 139  K 4.0  CL 105  CO2 25  GLUCOSE 139*  BUN 27*  CREATININE 1.01*  CALCIUM 9.8   LFT Recent Labs    11/03/22 2157  PROT 7.8  ALBUMIN 4.0  AST 14*  ALT 10  ALKPHOS 71  BILITOT 0.3   PT/INR No results for input(s): "LABPROT", "INR" in the last 72 hours. Hepatitis Panel No results for input(s): "HEPBSAG", "HCVAB", "HEPAIGM", "HEPBIGM" in the last 72 hours.    IMPRESSION:  #59 77 year old white female with recurring episodes of severe mid upper abdominal pain radiating to the left shoulder ,associated with nausea and vomiting.  Patient has had several episodes over the past year, now occurring about once monthly though this episode was the worst. Usually having onset while eating, lasting up to several hours until she has IV analgesia  Review of her recent workup with CT imaging x 2, and MR I/MRCP And most concerned that her episodes of severe pain are related to torsion of her intrathoracic stomach.  I do not think she has had any definite evidence of pancreatitis and do not think these episodes represent recurrent pancreatitis.  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  She also has evidence of mildly active Crohn's colitis  involving the transverse to hepatic flexure however this also would not explain these episodes of severe pain associated with nausea and vomiting.  #2 chronic diarrhea-longstanding and likely multifactoral with Crohn's colitis, pancreatic insufficiency and IBS. She has 2-5 bowel movements per day despite taking Colestid 2 g twice daily, Bentyl 20 mg 4 times daily, and Lialda 4.8 g daily  #3 COPD no oxygen requirement #4.  History of rheumatoid arthritis  PLAN: Soft diet today, n.p.o. past midnight Await pending lab/stool studies Will schedule for EGD with Dr. Rhea Belton in a.m. tomorrow.  Procedure was discussed in detail with the patient including indications risks and benefits and she is agreeable to proceed Hold Lovenox Patient is not sure at this time whether she would be agreeable to surgery even if the huge hiatal hernia and intrathoracic stomach are the etiology of her recurrent episodes of pain.  This could further be worked up as an outpatient. We may give her a trial of ODT Zofran and perhaps hydrocodone to have on hand for a future episode which may prevent another ER visit.  GI will follow with you   Kamla Skilton PA-C 11/04/2022, 10:25 AM

## 2022-11-04 NOTE — TOC CM/SW Note (Signed)
Transition of Care [facility]) - Inpatient Brief Assessment   Patient Details  Name: [name] MRN: [id number] Date of Birth: 16-Aug-1945  Transition of Care [facility]) CM/SW Contact:    Tom-Johnson, Hershal Coria, RN Phone Number: 11/04/2022, 3:49 PM   Clinical Narrative:    Patient presented to the ED with Upper Abdominal Pain, N/V. Has similar episodes with  hx of Pancreatitis. On IV abx. GI following.   From home with husband, has three supportive children. Independent with all cares at home. Currently on 2L O2, does not use home O2. Has a cane, walker and grab bars at home.  PCP is Philip Aspen, Limmie Patricia, MD and uses [facility] on [address].   No TOC needs or recommendations noted at this time.  Patient not Medically ready for discharge.  CM will continue to follow as patient progresses with care towards discharge.        Transition of Care Asessment: Insurance and Status: Insurance coverage has been reviewed Patient has primary care physician: Yes Home environment has been reviewed: Yes Prior level of function:: Independent Prior/Current Home Services: No current home services Social Determinants of Health Reivew: SDOH reviewed no interventions necessary Readmission risk has been reviewed: Yes Transition of care needs: no transition of care needs at this time

## 2022-11-04 NOTE — Progress Notes (Signed)
PROGRESS NOTE    [name]  ONG:[id number] DOB: 1946-01-07 DOA: 11/03/2022 PCP: Philip Aspen, Limmie Patricia, MD   Brief Narrative:  77 year old with history of chronic diarrhea, Crohn's colitis, large symptomatic paraesophageal hernia, IBS, GERD, HTN, CKD stage IIIa, rheumatoid arthritis comes to the ED with complaints of nausea vomiting and abdominal pain started on the day of admission.  CT scan showed transverse colitis.   Assessment & Plan:  Principal Problem:   Acute colitis Active Problems:   Hypertensive crisis   Chronic diarrhea   Hyperlipidemia   CKD (chronic kidney disease) stage 3, GFR 30-59 ml/min (HCC)   GERD (gastroesophageal reflux disease)   Crohn's disease (HCC)   Hiatal hernia   Emphysema lung (HCC)   IBS (irritable bowel syndrome)   Leukocytosis   Generalized anxiety disorder   Sepsis secondary to acute transverse colitis History of Crohn's disease -Sepsis physiology slowly improving.  CT scan reviewed.  Currently on IV Zosyn, diet as tolerated, pain control.  Admitting provider consulted [facility] -Check stool studies -Diet as tolerated -Continue mesalamine   Chronic pancreatitis Continue Creon 3 times daily   Chronic diarrhea On colestipol and Creon   IBS-diarrhea predominance Continue Bentyl as needed   CKD 3 A Creatinine around baseline 1.0   Essential hypertension Hypertensive crisis; improved Initial blood pressure is elevated.  Continue losartan.  IV as needed   GERD Daily Protonix   Emphysematous lung disease No active exacerbation.  As needed bronchodilators   Generalized anxiety disorder Continue Cymbalta   DVT prophylaxis: Lovenox Code Status: Full code Family Communication:   Cont hosp stay to complete work up Hopefully home tomorrow.     Subjective: Feels better, wants to go home. Still having diarrhea. Has not tried anything PO yet this morning.    Examination:  General exam: Appears calm and comfortable   Respiratory system: Clear to auscultation. Respiratory effort normal. Cardiovascular system: S1 & S2 heard, RRR. No JVD, murmurs, rubs, gallops or clicks. No pedal edema. Gastrointestinal system: Abdomen is nondistended, soft and nontender. No organomegaly or masses felt. Normal bowel sounds heard. Central nervous system: Alert and oriented. No focal neurological deficits. Extremities: Symmetric 5 x 5 power. Skin: No rashes, lesions or ulcers Psychiatry: Judgement and insight appear normal. Mood & affect appropriate.      Diet Orders (From admission, onward)     Start     Ordered   11/04/22 0511  Diet full liquid Room service appropriate? Yes; Fluid consistency: Thin  Diet effective now       Question Answer Comment  Room service appropriate? Yes   Fluid consistency: Thin      11/04/22 0510            Objective: Vitals:   11/04/22 0230 11/04/22 0410 11/04/22 0509 11/04/22 0648  BP: (!) 181/96 (!) 191/102  139/72  Pulse: 84 85  (!) 101  Resp:  18    Temp:  97.7 F (36.5 C)    TempSrc:  Oral    SpO2: 91% 100%    Weight:  51.4 kg 51.4 kg   Height:  5' (1.524 m) 5' (1.524 m)     Intake/Output Summary (Last 24 hours) at 11/04/2022 0817 Last data filed at 11/04/2022 0600 Gross per 24 hour  Intake 1154.69 ml  Output --  Net 1154.69 ml   Filed Weights   11/04/22 0410 11/04/22 0509  Weight: 51.4 kg 51.4 kg    Scheduled Meds:  colestipol  2 g Oral  BID   diphenoxylate-atropine  2 tablet Oral TID   DULoxetine  60 mg Oral BID   enoxaparin (LOVENOX) injection  30 mg Subcutaneous Q24H   fluticasone  2 spray Each Nare Daily   lipase/protease/amylase  36,000 Units Oral TID AC   losartan  100 mg Oral Daily   mesalamine  4.8 g Oral Q breakfast   mometasone-formoterol  2 puff Inhalation BID   pantoprazole  40 mg Oral Daily   sodium chloride flush  3 mL Intravenous Q12H   Continuous Infusions:  sodium chloride     piperacillin-tazobactam (ZOSYN)  IV       Nutritional status     Body mass index is 22.13 kg/m.  Data Reviewed:   CBC: Recent Labs  Lab 11/03/22 2157  WBC 17.8*  HGB 12.1  HCT 35.1*  MCV 87.5  PLT 374   Basic Metabolic Panel: Recent Labs  Lab 11/03/22 2157  NA 139  K 4.0  CL 105  CO2 25  GLUCOSE 139*  BUN 27*  CREATININE 1.01*  CALCIUM 9.8   GFR: Estimated Creatinine Clearance: 33.5 mL/min (A) (by C-G formula based on SCr of 1.01 mg/dL (H)). Liver Function Tests: Recent Labs  Lab 11/03/22 2157  AST 14*  ALT 10  ALKPHOS 71  BILITOT 0.3  PROT 7.8  ALBUMIN 4.0   Recent Labs  Lab 11/03/22 2157  LIPASE 75*   No results for input(s): "AMMONIA" in the last 168 hours. Coagulation Profile: No results for input(s): "INR", "PROTIME" in the last 168 hours. Cardiac Enzymes: No results for input(s): "CKTOTAL", "CKMB", "CKMBINDEX", "TROPONINI" in the last 168 hours. BNP (last 3 results) No results for input(s): "PROBNP" in the last 8760 hours. HbA1C: No results for input(s): "HGBA1C" in the last 72 hours. CBG: No results for input(s): "GLUCAP" in the last 168 hours. Lipid Profile: No results for input(s): "CHOL", "HDL", "LDLCALC", "TRIG", "CHOLHDL", "LDLDIRECT" in the last 72 hours. Thyroid Function Tests: No results for input(s): "TSH", "T4TOTAL", "FREET4", "T3FREE", "THYROIDAB" in the last 72 hours. Anemia Panel: No results for input(s): "VITAMINB12", "FOLATE", "FERRITIN", "TIBC", "IRON", "RETICCTPCT" in the last 72 hours. Sepsis Labs: No results for input(s): "PROCALCITON", "LATICACIDVEN" in the last 168 hours.  No results found for this or any previous visit (from the past 240 hour(s)).       Radiology Studies: CT ABDOMEN PELVIS W CONTRAST  Result Date: 11/04/2022 CLINICAL DATA:  Acute nonlocalized abdominal pain. Stomach pain, shoulder pain, vomiting, diarrhea, EXAM: CT ABDOMEN AND PELVIS WITH CONTRAST TECHNIQUE: Multidetector CT imaging of the abdomen and pelvis was performed using  the standard protocol following bolus administration of intravenous contrast. RADIATION DOSE REDUCTION: This exam was performed according to the departmental dose-optimization program which includes automated exposure control, adjustment of the mA and/or kV according to patient size and/or use of iterative reconstruction technique. CONTRAST:  OMNIPAQUE IOHEXOL 300 MG/ML  SOLN COMPARISON:  CT abdomen and pelvis 09/24/2022 and MRI abdomen 10/10/2022 FINDINGS: Lower chest: Large hiatal hernia with intrathoracic stomach. Emphysema. Left basilar atelectasis secondary to the hiatal hernia. Hepatobiliary: No focal liver abnormality is seen. No gallstones, gallbladder wall thickening, or biliary dilatation. Pancreas: Pancreas is atrophic. No ductal dilation or inflammatory changes. Spleen: Unremarkable. Adrenals/Urinary Tract: Stable adrenal glands and kidneys. Unremarkable bladder. No urinary calculi or hydronephrosis. Stomach/Bowel: Wall thickening and mild mucosal hyperenhancement about the transverse colon. Fecalized distal ileum compatible with slow transit. The appendix is not discretely seen. No evidence of obstruction. Vascular/Lymphatic: Calcified tortuous aorta without  aneurysm. No lymphadenopathy. Reproductive: Uterus and bilateral adnexa are unremarkable. Other: Small amount of free fluid in the abdomen. No free intraperitoneal air. Musculoskeletal: S shaped thoracolumbar scoliosis. No acute fracture. IMPRESSION: 1. Wall thickening and mild mucosal hyperenhancement about the right and transverse colon compatible with colitis. This has slightly progressed compared to CT 09/24/2022. 2. Large hiatal hernia with intrathoracic stomach. 3. Fecalized ileum compatible with slow transit. No dilation to suggest obstruction. Aortic Atherosclerosis (ICD10-I70.0) and Emphysema (ICD10-J43.9). Electronically Signed   By: Minerva Fester M.D.   On: 11/04/2022 01:58           LOS: 0 days   Time spent= 35  mins    Miguel Rota, MD [facility]  If 7PM-7AM, please contact night-coverage  11/04/2022, 8:17 AM

## 2022-11-04 NOTE — Consult Note (Signed)
Consultation  Referring Provider: TRH/Amin Primary Care Physician:  Philip Aspen, Limmie Patricia, MD Primary Gastroenterologist:  Dr.Perry  Reason for Consultation: Recurrent abdominal pain, nausea vomiting and chronic diarrhea  HPI: [name] is a 77 y.o. female, known to Dr. Marina Goodell with history of longstanding mild Crohn's colitis, GERD, hypertension, osteoarthritis, history of rheumatoid arthritis and significant scoliosis. He presented to the emergency room yesterday with complaints of abdominal pain acute onset and severe across the mid abdomen with radiation to the left shoulder, associated with nausea and vomiting.  This occurred immediately on starting to eat.  She also had numerous episodes of diarrhea yesterday which since has completely stopped. Says she has been having similar but less severe episodes of this pain almost monthly over the past several months.  The initial episode occurred several months ago while she was in [place], and did require an overnight admission there.  She had an episode in September 2027 which took her to the emergency room. She says these episodes are very intense, and will last multiple hours until she is given an IV analgesic then will abate and she feels okay.  In between these episodes she says she always feels up quickly, she has not been having any episodes of nausea or vomiting or regurgitation in between these episodes.  She last had colonoscopy in November 2022 with a normal-appearing terminal ileum, there was loss of haustral features, and mild patchy erythema throughout the colon.  Biopsies from the right colon showed mildly active Crohn's colitis, biopsies from the transverse colon mildly active Crohn's colitis and left colon biopsy showed inactive Crohn's colitis. She has been on Lialda 4.8 g daily, Colestid 2 g twice daily and Bentyl usually 20 mg p.o. 4 times daily. She had recently been seen in the office by Cira Servant on  10/04/2022 after that most recent ER visit. CT scan at that time had shown a short segment of mural wall thickening in the hepatic flexure, diffuse COPD, compressive atelectasis of the left lower lobe, normal-appearing gallbladder, atrophic pancreas with no main ductal dilation large hiatal hernia with nearly the entire stomach within the left chest, and trace amount of free fluid adjacent to the pancreatic tail .  At that time lipase was elevated at 167. 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  She was to start a trial of Zenpep, currently awaiting assistance program as it was going to be $800 a month.  She says she has been on pancreatic enzymes in the past without any improvement in symptoms.  Yesterday after arrival to the ER she had CT again with contrast which showed a large hiatal hernia with intrathoracic stomach, atrophic appearing pancreas again noted, wall thickening of the transverse colon and some fecalization of the distal ileum as well as a tortuous calcified aorta.  Labs show WBC of 17.8/hemoglobin 12.1/hematocrit  35 BUN 27/creatinine 1 LFTs within normal limits Lipase 75.  After she was able to have IV analgesic her pain resolved and has not recurred.  She says she feels fine currently After the multiple  episodes of diarrhea yesterday she also has not had any further stools.  She has several pending labs including fecal calprotectin, C. difficile quick screen, norovirus screen and GI path panel.  He had been seen by general surgery about 2 years ago/Dr. Stechschulte-regarding her huge hiatal hernia and at that time had sided against surgery as she states that every time she has had a surgery she has developed a severe staph infection.   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   Whooping cough 2015   Whooping cough with pneumonia    2017    Past Surgical History:  Procedure Laterality Date   CATARACT EXTRACTION, BILATERAL  2018   with IOC implant    COLONOSCOPY     DEBRIDEMENT AND CLOSURE WOUND Right 10/01/2018   Procedure: Excision of right knee wound with placement of ACell and Pravena;  Surgeon: Peggye Form, DO;  Location: [facility];  Service: Plastics;  Laterality: Right;  30 min   JOINT REPLACEMENT  2020   right knee   knee fracture      NASAL FRACTURE SURGERY     needed b/c she was getting frequent sinus infections    TOTAL KNEE ARTHROPLASTY Right 07/28/2018   Procedure: TOTAL KNEE ARTHROPLASTY;  Surgeon: Durene Romans, MD;  Location: [facility];  Service: Orthopedics;  Laterality: Right;   TOTAL KNEE ARTHROPLASTY Left 03/02/2019   Procedure: TOTAL KNEE ARTHROPLASTY;  Surgeon:  Durene Romans, MD;  Location: [facility];  Service: Orthopedics;  Laterality: Left;  70 mins   UPPER GASTROINTESTINAL ENDOSCOPY      Prior to Admission medications   Medication Sig Start Date End Date Taking? Authorizing Provider  albuterol (PROVENTIL) (2.5 MG/3ML) 0.083% nebulizer solution Take 3 mLs (2.5 mg total) by nebulization every 6 (six) hours as needed for wheezing or shortness of breath. 05/27/22  Yes Cobb, Ruby Cola, NP  albuterol (VENTOLIN HFA) 108 (90 Base) MCG/ACT inhaler Inhale 2 puffs into the lungs every 6 (six) hours as needed for wheezing or shortness of breath. 05/27/22  Yes Cobb, Ruby Cola, NP  budesonide-formoterol (SYMBICORT) 160-4.5 MCG/ACT inhaler Inhale 2 puffs into the lungs in the morning and at bedtime. 05/27/22  Yes Cobb, Ruby Cola, NP  Calcium Citrate-Vitamin D (CALCIUM + D PO) Take 1 tablet by mouth 2 (two) times daily.   Yes [provider]  cetirizine (ZYRTEC) 10 MG tablet Take 1 tablet (10 mg total) by mouth daily. 04/17/22  Yes Philip Aspen, Limmie Patricia, MD  colestipol (COLESTID)  1 g tablet Take 2 tablets (2 g total) by mouth 2 (two) times daily. 08/21/22  Yes Hilarie Fredrickson, MD  dicyclomine (BENTYL) 20 MG tablet Take 1 tablet (20 mg total) by mouth 4 (four) times daily as needed for spasms. 08/21/22  Yes Hilarie Fredrickson, MD  diphenoxylate-atropine (LOMOTIL) 2.5-0.025 MG tablet TAKE 2 TABLETS IN THE MORNING, AT [facility] AND AT BEDTIME AS DIRECTED 08/21/22  Yes Hilarie Fredrickson, MD  DULoxetine (CYMBALTA) 60 MG capsule Take 1 capsule (60 mg total) by mouth 2 (two) times daily. 04/17/22  Yes Philip Aspen, Limmie Patricia, MD  fluticasone [facility]) 50 MCG/ACT nasal spray Place 2 sprays into both nostrils daily. 04/17/22  Yes Philip Aspen, Limmie Patricia, MD  furosemide (LASIX) 20 MG tablet Take 1 tablet (20 mg total) by mouth daily as needed for edema. 04/17/22  Yes Philip Aspen, Limmie Patricia, MD  loperamide (IMODIUM) 2 MG capsule TAKE 1 CAPSULE BY MOUTH THREE TIMES A DAY AS  NEEDED FOR DIARRHEA OR LOOSE STOOLS 05/10/22  Yes Hilarie Fredrickson, MD  losartan (COZAAR) 100 MG tablet Take 1 tablet (100 mg total) by mouth daily. 04/17/22  Yes Philip Aspen, Limmie Patricia, MD  mesalamine (LIALDA) 1.2 g EC tablet Take 4 tablets (4.8 g total) by mouth daily with breakfast. 08/21/22  Yes Hilarie Fredrickson, MD  Multiple Vitamin (MULTIVITAMIN WITH MINERALS) TABS tablet Take 1 tablet by mouth daily.   Yes [provider]  omeprazole (PRILOSEC) 40 MG capsule TAKE 1 CAPSULE (40 MG TOTAL) BY MOUTH IN THE MORNING, AT NOON, AND AT BEDTIME. 08/21/22  Yes Hilarie Fredrickson, MD  valACYclovir (VALTREX) 1000 MG tablet TAKE 1 TABLET (1,000 MG TOTAL) BY MOUTH TWICE A DAY AS NEEDED 04/17/22  Yes Philip Aspen, Limmie Patricia, MD  lipase/protease/amylase (CREON) 36000 UNITS CPEP capsule Take 2 capsules by mouth with each meal, and take 1 capsule by mouth with each snack (max 8 capsules daily). Patient not taking: Reported on 11/04/2022 10/10/22   Legrand Como, PA-C    Current Facility-Administered Medications  Medication Dose Route Frequency Provider Last Rate Last Admin   0.9 %  sodium chloride infusion  250 mL Intravenous PRN Janalyn Shy, Subrina, MD       0.9 %  sodium chloride infusion   Intravenous Continuous Nelson Chimes, Ankit C, MD 50 mL/hr at 11/04/22 0859 New Bag at 11/04/22 0859   acetaminophen (TYLENOL) tablet 650 mg  650 mg Oral Q6H PRN Tereasa Coop, MD       Or   acetaminophen (TYLENOL) suppository 650 mg  650 mg Rectal Q6H PRN Janalyn Shy, Subrina, MD       colestipol (COLESTID) tablet 2 g  2 g Oral BID Janalyn Shy, Subrina, MD   2 g at 11/04/22 0855   dicyclomine (BENTYL) tablet 20 mg  20 mg Oral QID PRN Janalyn Shy, Subrina, MD       diphenoxylate-atropine (LOMOTIL) 2.5-0.025 MG per tablet 2 tablet  2 tablet Oral TID Tereasa Coop, MD   2 tablet at 11/04/22 0854   DULoxetine (CYMBALTA) DR capsule 60 mg  60 mg Oral BID Janalyn Shy, Subrina, MD   60 mg at 11/04/22 0854   enoxaparin (LOVENOX) injection 30 mg  30 mg  Subcutaneous Q24H Sundil, Subrina, MD       fluticasone (FLONASE) 50 MCG/ACT nasal spray 2 spray  2 spray Each Nare Daily Sundil, Subrina, MD       guaiFENesin (ROBITUSSIN) 100 MG/5ML liquid 5 mL  5 mL Oral Q4H PRN  Amin, Ankit C, MD       hydrALAZINE (APRESOLINE) injection 10 mg  10 mg Intravenous Q4H PRN Amin, Ankit C, MD       HYDROmorphone (DILAUDID) injection 1 mg  1 mg Intravenous Q3H PRN Sundil, Subrina, MD       ipratropium-albuterol (DUONEB) 0.5-2.5 (3) MG/3ML nebulizer solution 3 mL  3 mL Nebulization Q4H PRN Amin, Ankit C, MD       lipase/protease/amylase (CREON) capsule 36,000 Units  36,000 Units Oral TID AC Sundil, Subrina, MD   36,000 Units at 11/04/22 0855   losartan (COZAAR) tablet 100 mg  100 mg Oral Daily Sundil, Subrina, MD   100 mg at 11/04/22 0520   mesalamine (LIALDA) EC tablet 4.8 g  4.8 g Oral Q breakfast Sundil, Subrina, MD   4.8 g at 11/04/22 0854   metoprolol tartrate (LOPRESSOR) injection 5 mg  5 mg Intravenous Q4H PRN Amin, Ankit C, MD       mometasone-formoterol (DULERA) 200-5 MCG/ACT inhaler 2 puff  2 puff Inhalation BID Janalyn Shy, Subrina, MD   2 puff at 11/04/22 0830   ondansetron (ZOFRAN) injection 4 mg  4 mg Intravenous Q6H PRN Janalyn Shy, Subrina, MD       ondansetron (ZOFRAN) tablet 4 mg  4 mg Oral Q6H PRN Janalyn Shy, Subrina, MD       pantoprazole (PROTONIX) EC tablet 40 mg  40 mg Oral Daily Sundil, Subrina, MD   40 mg at 11/04/22 0854   piperacillin-tazobactam (ZOSYN) IVPB 3.375 g  3.375 g Intravenous Q8H Arabella Merles, RPH       senna-docusate (Senokot-S) tablet 1 tablet  1 tablet Oral QHS PRN Amin, Ankit C, MD       sodium chloride flush (NS) 0.9 % injection 3 mL  3 mL Intravenous Q12H Sundil, Subrina, MD       sodium chloride flush (NS) 0.9 % injection 3 mL  3 mL Intravenous PRN Janalyn Shy, Subrina, MD       traZODone (DESYREL) tablet 50 mg  50 mg Oral QHS PRN Amin, Ankit C, MD        Allergies as of 11/03/2022 - Review Complete 11/03/2022  Allergen Reaction Noted    Adhesive [tape]  10/08/2018   Aspirin  04/09/2018   Ciprofloxacin Nausea And Vomiting 07/28/2018   Codeine Nausea And Vomiting 04/09/2018   Demerol [meperidine hcl] Nausea And Vomiting 06/30/2018   Fentanyl Nausea And Vomiting 05/02/2021   Lactose intolerance (gi) Other (See Comments) 06/30/2018   Latex  04/09/2018   Other  07/28/2018   Prednisone Other (See Comments) 06/27/2021   Septra [sulfamethoxazole-trimethoprim] Nausea Only 04/09/2018    Family History  Problem Relation Age of Onset   Diabetes Father    Parkinson's disease Father    Colon cancer Other    Asthma Neg Hx    Esophageal cancer Neg Hx    Pancreatic cancer Neg Hx    Liver disease Neg Hx    Stomach cancer Neg Hx     Social History   Socioeconomic History   Marital status: Married    Spouse name: Not on file   Number of children: 3   Years of education: Not on file   Highest education level: Not on file  Occupational History   Not on file  Tobacco Use   Smoking status: Former    Current packs/day: 0.00    Average packs/day: 1 pack/day for 30.0 years (30.0 ttl pk-yrs)    Types: Cigarettes    Start date:  1960    Quit date: 69    Years since quitting: 34.8   Smokeless tobacco: Never  Vaping Use   Vaping status: Never Used  Substance and Sexual Activity   Alcohol use: Not Currently   Drug use: Never   Sexual activity: Not Currently    Birth control/protection: Post-menopausal  Other Topics Concern   Not on file  Social History Narrative   Not on file   Social Determinants of Health   Financial Resource Strain: Low Risk  (04/16/2022)   Overall Financial Resource Strain (CARDIA)    Difficulty of Paying Living Expenses: Not hard at all  Food Insecurity: No Food Insecurity (11/04/2022)   Hunger Vital Sign    Worried About Running Out of Food in the Last Year: Never true    Ran Out of Food in the Last Year: Never true  Transportation Needs: No Transportation Needs (11/04/2022)   PRAPARE -  Administrator, Civil Service (Medical): No    Lack of Transportation (Non-Medical): No  Physical Activity: Insufficiently Active (04/16/2022)   Exercise Vital Sign    Days of Exercise per Week: 2 days    Minutes of Exercise per Session: 20 min  Stress: No Stress Concern Present (04/16/2022)   [employer] of Occupational Health - Occupational Stress Questionnaire    Feeling of Stress : Not at all  Social Connections: Unknown (08/04/2022)   Received from [employer]   Family and [employer] Support    Help with Day-to-Day Activities: Not on file    Lonely or Isolated: Not on file  Intimate Partner Violence: Not At Risk (11/04/2022)   Humiliation, Afraid, Rape, and Kick questionnaire    Fear of Current or Ex-Partner: No    Emotionally Abused: No    Physically Abused: No    Sexually Abused: No    Review of Systems: Pertinent positive and negative review of systems were noted in the above HPI section.  All other review of systems was otherwise negative.   Physical Exam: Vital signs in last 24 hours: Temp:  [97.7 F (36.5 C)-98.8 F (37.1 C)] 98.8 F (37.1 C) (10/28 0902) Pulse Rate:  [67-111] 101 (10/28 0902) Resp:  [14-22] 16 (10/28 0902) BP: (139-217)/(69-108) 142/69 (10/28 0902) SpO2:  [89 %-100 %] 95 % (10/28 0902) FiO2 (%):  [28 %] 28 % (10/28 0056) Weight:  [51.4 kg] 51.4 kg (10/28 0509) Last BM Date : 11/04/22 General:   Alert,  Well-developed, well-nourished, generally white female pleasant and cooperative in NAD, on O2 at 2 L Head:  Normocephalic and atraumatic. Eyes:  Sclera clear, no icterus.   Conjunctiva pink. Ears:  Normal auditory acuity. Nose:  No deformity, discharge,  or lesions. Mouth:  No deformity or lesions.   Neck:  Supple; no masses or thyromegaly. Lungs: Decreased breath sounds bilaterally   no wheezes, crackles, or rhonchi.  Significant thoracic scoliosis  Heart:  Regular rate and rhythm; no murmurs, clicks, rubs,  or  gallops. Abdomen:  Soft,nontender, BS active,nonpalp mass or hsm.   Rectal: Not done Msk:  Symmetrical without gross deformities. . Pulses:  Normal pulses noted. Extremities:  Without clubbing or edema. Neurologic:  Alert and  oriented x4;  grossly normal neurologically. Skin:  Intact without significant lesions or rashes.. Psych:  Alert and cooperative. Normal mood and affect.  Intake/Output from previous day: 10/27 0701 - 10/28 0700 In: 1154.7 [P.O.:60; IV Piggyback:1094.7] Out: -  Intake/Output this shift: No intake/output data recorded.  Lab Results:  Recent Labs    11/03/22 2157  WBC 17.8*  HGB 12.1  HCT 35.1*  PLT 374   BMET Recent Labs    11/03/22 2157  NA 139  K 4.0  CL 105  CO2 25  GLUCOSE 139*  BUN 27*  CREATININE 1.01*  CALCIUM 9.8   LFT Recent Labs    11/03/22 2157  PROT 7.8  ALBUMIN 4.0  AST 14*  ALT 10  ALKPHOS 71  BILITOT 0.3   PT/INR No results for input(s): "LABPROT", "INR" in the last 72 hours. Hepatitis Panel No results for input(s): "HEPBSAG", "HCVAB", "HEPAIGM", "HEPBIGM" in the last 72 hours.    IMPRESSION:  #59 77 year old white female with recurring episodes of severe mid upper abdominal pain radiating to the left shoulder ,associated with nausea and vomiting.  Patient has had several episodes over the past year, now occurring about once monthly though this episode was the worst. Usually having onset while eating, lasting up to several hours until she has IV analgesia  Review of her recent workup with CT imaging x 2, and MR I/MRCP And most concerned that her episodes of severe pain are related to torsion of her intrathoracic stomach.  I do not think she has had any definite evidence of pancreatitis and do not think these episodes represent recurrent pancreatitis.  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  She also has evidence of mildly active Crohn's colitis  involving the transverse to hepatic flexure however this also would not explain these episodes of severe pain associated with nausea and vomiting.  #2 chronic diarrhea-longstanding and likely multifactoral with Crohn's colitis, pancreatic insufficiency and IBS. She has 2-5 bowel movements per day despite taking Colestid 2 g twice daily, Bentyl 20 mg 4 times daily, and Lialda 4.8 g daily  #3 COPD no oxygen requirement #4.  History of rheumatoid arthritis  PLAN: Soft diet today, n.p.o. past midnight Await pending lab/stool studies Will schedule for EGD with Dr. Rhea Belton in a.m. tomorrow.  Procedure was discussed in detail with the patient including indications risks and benefits and she is agreeable to proceed Hold Lovenox Patient is not sure at this time whether she would be agreeable to surgery even if the huge hiatal hernia and intrathoracic stomach are the etiology of her recurrent episodes of pain.  This could further be worked up as an outpatient. We may give her a trial of ODT Zofran and perhaps hydrocodone to have on hand for a future episode which may prevent another ER visit.  GI will follow with you   Kamla Skilton PA-C 11/04/2022, 10:25 AM

## 2022-11-04 NOTE — ED Notes (Signed)
Due to low oxygen readings Pt was placed on 2 lpm [place].

## 2022-11-04 NOTE — ED Notes (Signed)
ED TO INPATIENT HANDOFF REPORT  ED Nurse Name and Phone #:   S Name/Age/Gender [name] 77 y.o. female Room/Bed: DB003/DB003  Code Status   Code Status: Prior  Home/SNF/Other Home Patient oriented to: self, place, time, and situation Is this baseline? Yes   Triage Complete: Triage complete  Chief Complaint Colitis [K52.9]  Triage Note Pt c/o "pancreatitis attack," onset 7pm, reports hx of same. Endorses stomach pain, shoulder pain, vomiting since 7pm, but advises diarrhea "all day, & now I feel like I have to go but I can't." Pt in obvious pain during triage, but able to speak in complete sentences   Allergies Allergies  Allergen Reactions   Adhesive [Tape]     Redness and skin peeling   Aspirin     Intestinal Bleeding   Ciprofloxacin Nausea And Vomiting   Codeine Nausea And Vomiting   Demerol [Meperidine Hcl] Nausea And Vomiting   Fentanyl Nausea And Vomiting    Makes me very sick   Lactose Intolerance (Gi) Other (See Comments)    Pt has Crohn's    Latex     Redness and skin peeling   Other     Nuts-itching in throat  Seeds-stomach issues with chrons     Prednisone Other (See Comments)    Upset stomach   Septra [Sulfamethoxazole-Trimethoprim] Nausea Only    Level of Care/Admitting Diagnosis ED Disposition     ED Disposition  Admit   Condition  --   Comment  Hospital Area: [facility] [[id number]]  Level of Care: Med-Surg [16]  Interfacility transfer: Yes  May place patient in observation at [facility] or [facility] if equivalent level of care is available:: Yes  Covid Evaluation: Asymptomatic - no recent exposure (last 10 days) testing not required  Diagnosis: Colitis [[id number]]  Admitting Physician: Magnus Ivan [[id number]]  Attending Physician: Magnus Ivan [[id number]]          B Medical/Surgery History Past Medical History:  Diagnosis Date   Allergies    Bruises easily    Complication of anesthesia     Crohn's disease (HCC)    GI Dr Faylene Million in [place], [place]    Deviated septum    GERD (gastroesophageal reflux disease)    Headache    occ    Hiatal hernia    HTN (hypertension)    pcp Dr Lucienne Minks  in [place], [place]    Kidney disease, chronic, stage III (GFR 30-59 ml/min) (HCC)    Neuropathy    fingers,periodically    Osteoarthritis    Pancreatic insufficiency    PONV (postoperative nausea and vomiting)    Rheumatoid arthritis (HCC)    Scoliosis    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   Whooping cough 2015   Whooping cough with pneumonia    2017   Past Surgical History:  Procedure Laterality Date   CATARACT EXTRACTION, BILATERAL  2018   with IOC implant    COLONOSCOPY     DEBRIDEMENT AND CLOSURE WOUND Right 10/01/2018   Procedure: Excision of right knee wound with placement of ACell and Pravena;  Surgeon: Peggye Form, DO;  Location: [facility];  Service: Plastics;  Laterality: Right;  30 min   JOINT REPLACEMENT  2020   right knee   knee fracture      NASAL FRACTURE SURGERY     needed b/c she was getting frequent sinus infections    TOTAL KNEE ARTHROPLASTY Right  07/28/2018   Procedure: TOTAL KNEE ARTHROPLASTY;  Surgeon: Durene Romans, MD;  Location: [facility];  Service: Orthopedics;  Laterality: Right;   TOTAL KNEE ARTHROPLASTY Left 03/02/2019   Procedure: TOTAL KNEE ARTHROPLASTY;  Surgeon: Durene Romans, MD;  Location: [facility];  Service: Orthopedics;  Laterality: Left;  70 mins   UPPER GASTROINTESTINAL ENDOSCOPY       A IV Location/Drains/Wounds Patient Lines/Drains/Airways Status     Active Line/Drains/Airways     Name Placement date Placement time Site Days   Peripheral IV 11/03/22 20 G 1" Right Antecubital 11/03/22  2320  Antecubital  1            Intake/Output Last 24 hours  Intake/Output Summary (Last 24 hours) at 11/04/2022 0236 Last data filed at 11/04/2022 0056 Gross per 24 hour  Intake 1050.56 ml   Output --  Net 1050.56 ml    Labs/Imaging Results for orders placed or performed during the hospital encounter of 11/03/22 (from the past 48 hour(s))  Lipase, blood     Status: Abnormal   Collection Time: 11/03/22  9:57 PM  Result Value Ref Range   Lipase 75 (H) 11 - 51 U/L    Comment: Performed at [facility], [address] [place], [place] [address]  Comprehensive metabolic panel     Status: Abnormal   Collection Time: 11/03/22  9:57 PM  Result Value Ref Range   Sodium 139 135 - 145 mmol/L   Potassium 4.0 3.5 - 5.1 mmol/L   Chloride 105 98 - 111 mmol/L   CO2 25 22 - 32 mmol/L   Glucose, Bld 139 (H) 70 - 99 mg/dL    Comment: Glucose reference range applies only to samples taken after fasting for at least 8 hours.   BUN 27 (H) 8 - 23 mg/dL   Creatinine, Ser 9.62 (H) 0.44 - 1.00 mg/dL   Calcium 9.8 8.9 - 95.2 mg/dL   Total Protein 7.8 6.5 - 8.1 g/dL   Albumin 4.0 3.5 - 5.0 g/dL   AST 14 (L) 15 - 41 U/L   ALT 10 0 - 44 U/L   Alkaline Phosphatase 71 38 - 126 U/L   Total Bilirubin 0.3 0.3 - 1.2 mg/dL   GFR, Estimated 57 (L) >60 mL/min    Comment: (NOTE) Calculated using the CKD-EPI Creatinine Equation (2021)    Anion gap 9 5 - 15    Comment: Performed at [facility], [address] [place], [place] [address]  CBC     Status: Abnormal   Collection Time: 11/03/22  9:57 PM  Result Value Ref Range   WBC 17.8 (H) 4.0 - 10.5 K/uL   RBC 4.01 3.87 - 5.11 MIL/uL   Hemoglobin 12.1 12.0 - 15.0 g/dL   HCT 44.0 (L) 10.2 - 72.5 %   MCV 87.5 80.0 - 100.0 fL   MCH 30.2 26.0 - 34.0 pg   MCHC 34.5 30.0 - 36.0 g/dL   RDW 36.6 (H) 44.0 - 34.7 %   Platelets 374 150 - 400 K/uL   nRBC 0.0 0.0 - 0.2 %    Comment: Performed at [facility], [address], [place], [place] [address]  Troponin I (High Sensitivity)     Status: None   Collection Time: 11/03/22  9:57 PM  Result Value Ref Range   Troponin I (High Sensitivity) 3 <18  ng/L    Comment: (NOTE) Elevated high sensitivity troponin I (hsTnI) values and significant  changes across serial measurements may  suggest ACS but many other  chronic and acute conditions are known to elevate hsTnI results.  Refer to the "Links" section for chest pain algorithms and additional  guidance. Performed at [facility], [address] [place], [place] [address]   Troponin I (High Sensitivity)     Status: None   Collection Time: 11/04/22  1:22 AM  Result Value Ref Range   Troponin I (High Sensitivity) 4 <18 ng/L    Comment: (NOTE) Elevated high sensitivity troponin I (hsTnI) values and significant  changes across serial measurements may suggest ACS but many other  chronic and acute conditions are known to elevate hsTnI results.  Refer to the "Links" section for chest pain algorithms and additional  guidance. Performed at [facility], [address] [place], [place] [address]    CT ABDOMEN PELVIS W CONTRAST  Result Date: 11/04/2022 CLINICAL DATA:  Acute nonlocalized abdominal pain. Stomach pain, shoulder pain, vomiting, diarrhea, EXAM: CT ABDOMEN AND PELVIS WITH CONTRAST TECHNIQUE: Multidetector CT imaging of the abdomen and pelvis was performed using the standard protocol following bolus administration of intravenous contrast. RADIATION DOSE REDUCTION: This exam was performed according to the departmental dose-optimization program which includes automated exposure control, adjustment of the mA and/or kV according to patient size and/or use of iterative reconstruction technique. CONTRAST:  OMNIPAQUE IOHEXOL 300 MG/ML  SOLN COMPARISON:  CT abdomen and pelvis 09/24/2022 and MRI abdomen 10/10/2022 FINDINGS: Lower chest: Large hiatal hernia with intrathoracic stomach. Emphysema. Left basilar atelectasis secondary to the hiatal hernia. Hepatobiliary: No focal liver abnormality is seen. No gallstones, gallbladder wall thickening, or  biliary dilatation. Pancreas: Pancreas is atrophic. No ductal dilation or inflammatory changes. Spleen: Unremarkable. Adrenals/Urinary Tract: Stable adrenal glands and kidneys. Unremarkable bladder. No urinary calculi or hydronephrosis. Stomach/Bowel: Wall thickening and mild mucosal hyperenhancement about the transverse colon. Fecalized distal ileum compatible with slow transit. The appendix is not discretely seen. No evidence of obstruction. Vascular/Lymphatic: Calcified tortuous aorta without aneurysm. No lymphadenopathy. Reproductive: Uterus and bilateral adnexa are unremarkable. Other: Small amount of free fluid in the abdomen. No free intraperitoneal air. Musculoskeletal: S shaped thoracolumbar scoliosis. No acute fracture. IMPRESSION: 1. Wall thickening and mild mucosal hyperenhancement about the right and transverse colon compatible with colitis. This has slightly progressed compared to CT 09/24/2022. 2. Large hiatal hernia with intrathoracic stomach. 3. Fecalized ileum compatible with slow transit. No dilation to suggest obstruction. Aortic Atherosclerosis (ICD10-I70.0) and Emphysema (ICD10-J43.9). Electronically Signed   By: Minerva Fester M.D.   On: 11/04/2022 01:58    Pending Labs Unresulted Labs (From admission, onward)     Start     Ordered   11/03/22 2144  Urinalysis, Routine w reflex microscopic -Urine, Clean Catch  Once,   URGENT       Question:  Specimen Source  Answer:  Urine, Clean Catch   11/03/22 2143            Vitals/Pain Today's Vitals   11/04/22 0038 11/04/22 0056 11/04/22 0100 11/04/22 0200  BP:   (!) 203/97 (!) 187/94  Pulse:   85 92  Resp:   16 18  Temp: 97.8 F (36.6 C)     TempSrc: Tympanic     SpO2:  90% (!) 89% 91%  PainSc:   5      Isolation Precautions No active isolations  Medications Medications  piperacillin-tazobactam (ZOSYN) IVPB 3.375 g (3.375 g Intravenous New Bag/Given 11/04/22 0214)  lactated ringers bolus 1,000 mL ( Intravenous Stopped  11/04/22 0042)  HYDROmorphone (DILAUDID) injection 1 mg (1 mg Intravenous Given 11/03/22 2325)  ondansetron (ZOFRAN) injection 4 mg (4 mg Intravenous Given 11/03/22 2327)  iohexol (OMNIPAQUE) 300 MG/ML solution 100 mL (100 mLs Intravenous Contrast Given 11/04/22 0129)  HYDROmorphone (DILAUDID) injection 1 mg (1 mg Intravenous Given 11/04/22 0015)  metoCLOPramide (REGLAN) injection 10 mg (10 mg Intravenous Given 11/04/22 0025)    Mobility walks     Focused Assessments    R Recommendations: See Admitting Provider Note  Report given to:   Additional Notes:

## 2022-11-04 NOTE — Progress Notes (Signed)
Patient has not had any diarrhea this shift and this nurse was unable to collect stool specimen.

## 2022-11-04 NOTE — Plan of Care (Signed)

## 2022-11-04 NOTE — Progress Notes (Signed)
Pharmacy Antibiotic Note  [name] is a 77 y.o. female admitted on 11/03/2022 with abdominal pain and N/V with concerns for intra-abdominal infection. Pharmacy has been consulted for Zosyn dosing. PMH includes chronic diarrhea, Crohn's colitis, large symptomatic paraesophageal hernia, GERD, IBS, CKD, and rheumatoid arthritis   WBC 17.8, sCr 1.01, afebrile, CT abdomen: colitis involving right colon Zosyn x1 in ED  Plan: -Zosyn 3.375gm IV every 8 hours -Follow up signs of clinical improvement, LOT, de-escalation   Height: 5' (152.4 cm) IBW/kg (Calculated) : 45.5  Temp (24hrs), Avg:97.8 F (36.6 C), Min:97.7 F (36.5 C), Max:97.8 F (36.6 C)  Recent Labs  Lab 11/03/22 2157  WBC 17.8*  CREATININE 1.01*    CrCl cannot be calculated (Unknown ideal weight.).    Antimicrobials this admission: Zosyn 10/27 >>   Thank you for allowing pharmacy to be a part of this patient's care.  Arabella Merles, PharmD. Clinical Pharmacist 11/04/2022 4:52 AM

## 2022-11-04 NOTE — ED Notes (Signed)
Called for transport spoke with Kennon Rounds at 2:49

## 2022-11-05 ENCOUNTER — Inpatient Hospital Stay (HOSPITAL_COMMUNITY): Payer: Medicare Other | Admitting: Registered Nurse

## 2022-11-05 ENCOUNTER — Encounter (HOSPITAL_COMMUNITY)
Admission: EM | Disposition: A | Discharge status: Home / Self Care | Payer: Self-pay | Source: Home / Self Care | Attending: Internal Medicine

## 2022-11-05 ENCOUNTER — Encounter (HOSPITAL_COMMUNITY): Payer: Self-pay | Admitting: Internal Medicine

## 2022-11-05 ENCOUNTER — Telehealth: Payer: Self-pay

## 2022-11-05 DIAGNOSIS — K2970 Gastritis, unspecified, without bleeding: Secondary | ICD-10-CM

## 2022-11-05 DIAGNOSIS — R109 Unspecified abdominal pain: Secondary | ICD-10-CM

## 2022-11-05 DIAGNOSIS — K529 Noninfective gastroenteritis and colitis, unspecified: Secondary | ICD-10-CM | POA: Diagnosis not present

## 2022-11-05 DIAGNOSIS — R1013 Epigastric pain: Secondary | ICD-10-CM | POA: Diagnosis not present

## 2022-11-05 DIAGNOSIS — Q399 Congenital malformation of esophagus, unspecified: Secondary | ICD-10-CM | POA: Diagnosis not present

## 2022-11-05 DIAGNOSIS — J449 Chronic obstructive pulmonary disease, unspecified: Secondary | ICD-10-CM | POA: Diagnosis not present

## 2022-11-05 DIAGNOSIS — K449 Diaphragmatic hernia without obstruction or gangrene: Secondary | ICD-10-CM | POA: Diagnosis not present

## 2022-11-05 DIAGNOSIS — R112 Nausea with vomiting, unspecified: Secondary | ICD-10-CM

## 2022-11-05 DIAGNOSIS — K3189 Other diseases of stomach and duodenum: Secondary | ICD-10-CM

## 2022-11-05 HISTORY — PX: BIOPSY: SHX5522

## 2022-11-05 HISTORY — PX: ESOPHAGOGASTRODUODENOSCOPY (EGD) WITH PROPOFOL: SHX5813

## 2022-11-05 LAB — CBC
HCT: 31.8 % — ABNORMAL LOW (ref 36.0–46.0)
Hemoglobin: 10.7 g/dL — ABNORMAL LOW (ref 12.0–15.0)
MCH: 30.5 pg (ref 26.0–34.0)
MCHC: 33.6 g/dL (ref 30.0–36.0)
MCV: 90.6 fL (ref 80.0–100.0)
Platelets: 317 10*3/uL (ref 150–400)
RBC: 3.51 MIL/uL — ABNORMAL LOW (ref 3.87–5.11)
RDW: 15.9 % — ABNORMAL HIGH (ref 11.5–15.5)
WBC: 10.3 10*3/uL (ref 4.0–10.5)
nRBC: 0 % (ref 0.0–0.2)

## 2022-11-05 LAB — MAGNESIUM: Magnesium: 1.6 mg/dL — ABNORMAL LOW (ref 1.7–2.4)

## 2022-11-05 LAB — COMPREHENSIVE METABOLIC PANEL
ALT: 12 U/L (ref 0–44)
AST: 17 U/L (ref 15–41)
Albumin: 2.8 g/dL — ABNORMAL LOW (ref 3.5–5.0)
Alkaline Phosphatase: 62 U/L (ref 38–126)
Anion gap: 8 (ref 5–15)
BUN: 18 mg/dL (ref 8–23)
CO2: 23 mmol/L (ref 22–32)
Calcium: 8.7 mg/dL — ABNORMAL LOW (ref 8.9–10.3)
Chloride: 107 mmol/L (ref 98–111)
Creatinine, Ser: 1.28 mg/dL — ABNORMAL HIGH (ref 0.44–1.00)
GFR, Estimated: 43 mL/min — ABNORMAL LOW (ref >60)
Glucose, Bld: 95 mg/dL (ref 70–99)
Potassium: 3.8 mmol/L (ref 3.5–5.1)
Sodium: 138 mmol/L (ref 135–145)
Total Bilirubin: 0.6 mg/dL (ref 0.3–1.2)
Total Protein: 6.3 g/dL — ABNORMAL LOW (ref 6.5–8.1)

## 2022-11-05 LAB — PHOSPHORUS: Phosphorus: 3.9 mg/dL (ref 2.5–4.6)

## 2022-11-05 SURGERY — ESOPHAGOGASTRODUODENOSCOPY (EGD) WITH PROPOFOL
Anesthesia: Monitor Anesthesia Care

## 2022-11-05 MED ORDER — PROPOFOL 500 MG/50ML IV EMUL
INTRAVENOUS | Status: DC | PRN
  Administered 2022-11-05: 100 ug/kg/min via INTRAVENOUS

## 2022-11-05 MED ORDER — PROPOFOL 10 MG/ML IV BOLUS
INTRAVENOUS | Status: DC | PRN
  Administered 2022-11-05: 20 mg via INTRAVENOUS
  Administered 2022-11-05 (×2): 30 mg via INTRAVENOUS

## 2022-11-05 MED ORDER — SODIUM CHLORIDE 0.9 % IV SOLN: INTRAVENOUS | Status: DC | PRN

## 2022-11-05 MED ORDER — PHENYLEPHRINE HCL (PRESSORS) 10 MG/ML IV SOLN
INTRAVENOUS | Status: DC | PRN
  Administered 2022-11-05: 120 ug via INTRAVENOUS

## 2022-11-05 MED ORDER — LIDOCAINE 2% (20 MG/ML) 5 ML SYRINGE
INTRAMUSCULAR | Status: DC | PRN
  Administered 2022-11-05: 60 mg via INTRAVENOUS

## 2022-11-05 MED ORDER — OXYCODONE HCL 5 MG PO CAPS: 5.0000 mg | ORAL_CAPSULE | Freq: Four times a day (QID) | ORAL | 0 refills | Status: DC | PRN

## 2022-11-05 MED ORDER — MAGNESIUM OXIDE -MG SUPPLEMENT 400 (240 MG) MG PO TABS
800.0000 mg | ORAL_TABLET | Freq: Once | ORAL | Status: AC
  Administered 2022-11-05: 800 mg via ORAL
  Filled 2022-11-05 (×2): qty 2

## 2022-11-05 MED ORDER — OMEPRAZOLE 40 MG PO CPDR: 40.0000 mg | DELAYED_RELEASE_CAPSULE | Freq: Every day | ORAL | 0 refills | Status: DC

## 2022-11-05 MED ORDER — BISACODYL 5 MG PO TBEC: 5.0000 mg | DELAYED_RELEASE_TABLET | Freq: Every day | ORAL | 0 refills | Status: DC | PRN

## 2022-11-05 MED ORDER — GLYCOPYRROLATE PF 0.2 MG/ML IJ SOSY
PREFILLED_SYRINGE | INTRAMUSCULAR | Status: DC | PRN
  Administered 2022-11-05: .1 mg via INTRAVENOUS

## 2022-11-05 MED ORDER — ONDANSETRON HCL 4 MG PO TABS: 4.0000 mg | ORAL_TABLET | Freq: Three times a day (TID) | ORAL | 0 refills | Status: DC | PRN

## 2022-11-05 SURGICAL SUPPLY — 15 items

## 2022-11-05 NOTE — Transfer of Care (Signed)
Immediate Anesthesia Transfer of Care Note  Patient: Toniyah I Joos  Procedure(s) Performed: ESOPHAGOGASTRODUODENOSCOPY (EGD) WITH PROPOFOL BIOPSY  Patient Location: PACU  Anesthesia Type:MAC  Level of Consciousness: awake  Airway & Oxygen Therapy: Patient Spontanous Breathing  Post-op Assessment: Report given to RN and Post -op Vital signs reviewed and stable  Post vital signs: Reviewed and stable  Last Vitals:  Vitals Value Taken Time  BP 145/83 11/05/22 0915  Temp 36.6 C 11/05/22 0915  Pulse 74 11/05/22 0916  Resp 13 11/05/22 0916  SpO2 94 % 11/05/22 0916  Vitals shown include unfiled device data.  Last Pain:  Vitals:   11/05/22 0915  TempSrc: Temporal  PainSc: Asleep         Complications: No notable events documented.

## 2022-11-05 NOTE — Discharge Summary (Signed)
Physician Discharge Summary  [name] WGN:[id number] DOB: March 02, 1945 DOA: 11/03/2022  PCP: Philip Aspen, Limmie Patricia, MD  Admit date: 11/03/2022 Discharge date: 11/05/2022  Admitted From: Home Disposition: Home  Recommendations for Outpatient Follow-up:  Follow up with PCP in 1-2 weeks Please obtain BMP/CBC in one week your next doctors visit.  PPI daily 30 minutes before meal Antiemetics as needed Oxycodone 5 mg IR every 6 as needed.  30 tablets prescribed.  Bowel regimen prescribed as well. Follow-up outpatient gastroenterology and general surgery   Discharge Condition: Stable CODE STATUS: Full code Diet recommendation: Diet as tolerated    Brief Narrative:  77 year old with history of chronic diarrhea, Crohn's colitis, large symptomatic paraesophageal hernia, IBS, GERD, HTN, CKD stage IIIa, rheumatoid arthritis comes to the ED with complaints of nausea vomiting and abdominal pain started on the day of admission.  CT scan showed transverse colitis.  GI team was consulted and due to ongoing chronic abdominal pain in the epigastric area, EGD planned on 10/29.Endoscopy showed torturous esophagus with large hiatal hernia.  It was recommended that she follows up outpatient with GI and general surgery to possibly rediscuss repair of her hiatal hernia. For now patient is tolerating p.o.  Will discharge her with antiemetics, pain medication and bowel regimen supply. Instructions given by me at bedside.  Husband also present at bedside.     Assessment & Plan:  Principal Problem:   Acute colitis Active Problems:   Hypertensive crisis   Chronic diarrhea   Hyperlipidemia   CKD (chronic kidney disease) stage 3, GFR 30-59 ml/min (HCC)   GERD (gastroesophageal reflux disease)   Crohn's disease (HCC)   Hiatal hernia   Emphysema lung (HCC)   IBS (irritable bowel syndrome)   Leukocytosis   Generalized anxiety disorder   Transverse colitis seen in the setting of Crohn's  disease. - Initially thought to be sepsis but no obvious evidence of infection.  This is likely secondary to inflammatory bowel disease..  This inflammation is likely related to her Crohn's, will discontinue antibiotics.  Diarrhea has resolved.  Status post endoscopy today.  She is tolerating p.o., continue home mesalamine.   Chronic pancreatitis Large hiatal hernia Continue Creon 3 times daily.  Endoscopy showing tortuous esophagus with large hiatal hernia.  Will discharge patient on pain medication and antiemetics.  Will discuss further options with outpatient general surgery and GI.   Chronic diarrhea On colestipol and Creon   IBS-diarrhea predominance Continue Bentyl as needed   CKD 3 A Creatinine around baseline 1.0   Essential hypertension Hypertensive crisis; improved Resume her home regimen   GERD Continue daily PPI before meals   Emphysematous lung disease No active exacerbation.  As needed bronchodilators   Generalized anxiety disorder Continue Cymbalta     DVT prophylaxis: Lovenox Code Status: Full code Family Communication: Spouse present at bedside Feeling much better after endoscopy.  No other complaints.  Will discharge her today   Subjective: Feeling well, wanting to go home.     Examination:   General exam: Appears calm and comfortable  Respiratory system: Clear to auscultation. Respiratory effort normal. Cardiovascular system: S1 & S2 heard, RRR. No JVD, murmurs, rubs, gallops or clicks. No pedal edema. Gastrointestinal system: Abdomen is nondistended, soft and nontender. No organomegaly or masses felt. Normal bowel sounds heard. Central nervous system: Alert and oriented. No focal neurological deficits. Extremities: Symmetric 5 x 5 power. Skin: No rashes, lesions or ulcers Psychiatry: Judgement and insight appear normal. Mood & affect appropriate.  Consultations: [facility]   Discharge Exam: Vitals:   11/05/22 0935  11/05/22 1013  BP: (!) 158/86 (!) 161/87  Pulse: 80 76  Resp: 13 18  Temp:  97.7 F (36.5 C)  SpO2: 94% 98%   Vitals:   11/05/22 0915 11/05/22 0925 11/05/22 0935 11/05/22 1013  BP: (!) 145/83 138/83 (!) 158/86 (!) 161/87  Pulse: 72 82 80 76  Resp: 15 (!) 21 13 18   Temp: 97.9 F (36.6 C)   97.7 F (36.5 C)  TempSrc: Temporal     SpO2: 94% 94% 94% 98%  Weight:      Height:        Discharge Instructions   Allergies as of 11/05/2022       Reactions   Adhesive [tape]    Redness and skin peeling   Aspirin    Intestinal Bleeding   Ciprofloxacin Nausea And Vomiting   Codeine Nausea And Vomiting   Demerol [meperidine Hcl] Nausea And Vomiting   Fentanyl Nausea And Vomiting   Makes me very sick   Lactose Intolerance (gi) Other (See Comments)   Pt has Crohn's    Latex    Redness and skin peeling   Other    Nuts-itching in throat  Seeds-stomach issues with chrons    Prednisone Other (See Comments)   Upset stomach   Septra [sulfamethoxazole-trimethoprim] Nausea Only        Medication List     TAKE these medications    albuterol 108 (90 Base) MCG/ACT inhaler Commonly known as: VENTOLIN HFA Inhale 2 puffs into the lungs every 6 (six) hours as needed for wheezing or shortness of breath.   albuterol (2.5 MG/3ML) 0.083% nebulizer solution Commonly known as: PROVENTIL Take 3 mLs (2.5 mg total) by nebulization every 6 (six) hours as needed for wheezing or shortness of breath.   bisacodyl 5 MG EC tablet Commonly known as: Dulcolax Take 1 tablet (5 mg total) by mouth daily as needed for moderate constipation or severe constipation.   budesonide-formoterol 160-4.5 MCG/ACT inhaler Commonly known as: Symbicort Inhale 2 puffs into the lungs in the morning and at bedtime.   CALCIUM + D PO Take 1 tablet by mouth 2 (two) times daily.   cetirizine 10 MG tablet Commonly known as: ZYRTEC Take 1 tablet (10 mg total) by mouth daily.   colestipol 1 g tablet Commonly known  as: COLESTID Take 2 tablets (2 g total) by mouth 2 (two) times daily.   dicyclomine 20 MG tablet Commonly known as: BENTYL Take 1 tablet (20 mg total) by mouth 4 (four) times daily as needed for spasms.   diphenoxylate-atropine 2.5-0.025 MG tablet Commonly known as: LOMOTIL TAKE 2 TABLETS IN THE MORNING, AT NOON AND AT BEDTIME AS DIRECTED   DULoxetine 60 MG capsule Commonly known as: CYMBALTA Take 1 capsule (60 mg total) by mouth 2 (two) times daily.   fluticasone 50 MCG/ACT nasal spray Commonly known as: FLONASE Place 2 sprays into both nostrils daily.   furosemide 20 MG tablet Commonly known as: LASIX Take 1 tablet (20 mg total) by mouth daily as needed for edema.   lipase/protease/amylase 16109 UNITS Cpep capsule Commonly known as: Creon Take 2 capsules by mouth with each meal, and take 1 capsule by mouth with each snack (max 8 capsules daily).   loperamide 2 MG capsule Commonly known as: IMODIUM TAKE 1 CAPSULE BY MOUTH THREE TIMES A DAY AS NEEDED FOR DIARRHEA OR LOOSE STOOLS   losartan 100 MG tablet Commonly known  as: COZAAR Take 1 tablet (100 mg total) by mouth daily.   mesalamine 1.2 g EC tablet Commonly known as: LIALDA Take 4 tablets (4.8 g total) by mouth daily with breakfast.   multivitamin with minerals Tabs tablet Take 1 tablet by mouth daily.   omeprazole 40 MG capsule Commonly known as: PRILOSEC Take 1 capsule (40 mg total) by mouth daily before breakfast. TAKE 1 CAPSULE (40 MG TOTAL) BY MOUTH IN THE MORNING, AT NOON, AND AT BEDTIME. What changed:  how much to take how to take this when to take this   ondansetron 4 MG tablet Commonly known as: Zofran Take 1 tablet (4 mg total) by mouth every 8 (eight) hours as needed for nausea or vomiting.   oxycodone 5 MG capsule Commonly known as: OXY-IR Take 1 capsule (5 mg total) by mouth every 6 (six) hours as needed for pain.   valACYclovir 1000 MG tablet Commonly known as: VALTREX TAKE 1 TABLET (1,000  MG TOTAL) BY MOUTH TWICE A DAY AS NEEDED        Follow-up Information     Philip Aspen, Limmie Patricia, MD Follow up in 1 week(s).   Specialty: Internal Medicine Contact information: [address] [place] [address] [phone]                Allergies  Allergen Reactions   Adhesive [Tape]     Redness and skin peeling   Aspirin     Intestinal Bleeding   Ciprofloxacin Nausea And Vomiting   Codeine Nausea And Vomiting   Demerol [Meperidine Hcl] Nausea And Vomiting   Fentanyl Nausea And Vomiting    Makes me very sick   Lactose Intolerance (Gi) Other (See Comments)    Pt has Crohn's    Latex     Redness and skin peeling   Other     Nuts-itching in throat  Seeds-stomach issues with chrons     Prednisone Other (See Comments)    Upset stomach   Septra [Sulfamethoxazole-Trimethoprim] Nausea Only    You were cared for by a hospitalist during your hospital stay. If you have any questions about your discharge medications or the care you received while you were in the hospital after you are discharged, you can call the unit and asked to speak with the hospitalist on call if the hospitalist that took care of you is not available. Once you are discharged, your primary care physician will handle any further medical issues. Please note that no refills for any discharge medications will be authorized once you are discharged, as it is imperative that you return to your primary care physician (or establish a relationship with a primary care physician if you do not have one) for your aftercare needs so that they can reassess your need for medications and monitor your lab values.  You were cared for by a hospitalist during your hospital stay. If you have any questions about your discharge medications or the care you received while you were in the hospital after you are discharged, you can call the unit and asked to speak with the hospitalist on call if the hospitalist that took  care of you is not available. Once you are discharged, your primary care physician will handle any further medical issues. Please note that NO REFILLS for any discharge medications will be authorized once you are discharged, as it is imperative that you return to your primary care physician (or establish a relationship with a primary care physician if you  do not have one) for your aftercare needs so that they can reassess your need for medications and monitor your lab values.  Please request your Prim.MD to go over all Hospital Tests and Procedure/Radiological results at the follow up, please get all Hospital records sent to your Prim MD by signing hospital release before you go home.  Get CBC, CMP, 2 view Chest X ray checked  by Primary MD during your next visit or SNF MD in 5-7 days ( we routinely change or add medications that can affect your baseline labs and fluid status, therefore we recommend that you get the mentioned basic workup next visit with your PCP, your PCP may decide not to get them or add new tests based on their clinical decision)  On your next visit with your primary care physician please Get Medicines reviewed and adjusted.  If you experience worsening of your admission symptoms, develop shortness of breath, life threatening emergency, suicidal or homicidal thoughts you must seek medical attention immediately by calling 911 or calling your MD immediately  if symptoms less severe.  You Must read complete instructions/literature along with all the possible adverse reactions/side effects for all the Medicines you take and that have been prescribed to you. Take any new Medicines after you have completely understood and accpet all the possible adverse reactions/side effects.   Do not drive, operate heavy machinery, perform activities at heights, swimming or participation in water activities or provide baby sitting services if your were admitted for syncope or siezures until you have seen  by Primary MD or a Neurologist and advised to do so again.  Do not drive when taking Pain medications.   Procedures/Studies: CT ABDOMEN PELVIS W CONTRAST  Result Date: 11/04/2022 CLINICAL DATA:  Acute nonlocalized abdominal pain. Stomach pain, shoulder pain, vomiting, diarrhea, EXAM: CT ABDOMEN AND PELVIS WITH CONTRAST TECHNIQUE: Multidetector CT imaging of the abdomen and pelvis was performed using the standard protocol following bolus administration of intravenous contrast. RADIATION DOSE REDUCTION: This exam was performed according to the departmental dose-optimization program which includes automated exposure control, adjustment of the mA and/or kV according to patient size and/or use of iterative reconstruction technique. CONTRAST:  OMNIPAQUE IOHEXOL 300 MG/ML  SOLN COMPARISON:  CT abdomen and pelvis 09/24/2022 and MRI abdomen 10/10/2022 FINDINGS: Lower chest: Large hiatal hernia with intrathoracic stomach. Emphysema. Left basilar atelectasis secondary to the hiatal hernia. Hepatobiliary: No focal liver abnormality is seen. No gallstones, gallbladder wall thickening, or biliary dilatation. Pancreas: Pancreas is atrophic. No ductal dilation or inflammatory changes. Spleen: Unremarkable. Adrenals/Urinary Tract: Stable adrenal glands and kidneys. Unremarkable bladder. No urinary calculi or hydronephrosis. Stomach/Bowel: Wall thickening and mild mucosal hyperenhancement about the transverse colon. Fecalized distal ileum compatible with slow transit. The appendix is not discretely seen. No evidence of obstruction. Vascular/Lymphatic: Calcified tortuous aorta without aneurysm. No lymphadenopathy. Reproductive: Uterus and bilateral adnexa are unremarkable. Other: Small amount of free fluid in the abdomen. No free intraperitoneal air. Musculoskeletal: S shaped thoracolumbar scoliosis. No acute fracture. IMPRESSION: 1. Wall thickening and mild mucosal hyperenhancement about the right and transverse colon  compatible with colitis. This has slightly progressed compared to CT 09/24/2022. 2. Large hiatal hernia with intrathoracic stomach. 3. Fecalized ileum compatible with slow transit. No dilation to suggest obstruction. Aortic Atherosclerosis (ICD10-I70.0) and Emphysema (ICD10-J43.9). Electronically Signed   By: Minerva Fester M.D.   On: 11/04/2022 01:58   MR ABDOMEN MRCP W WO CONTAST  Result Date: 10/18/2022 CLINICAL DATA:  elevated IgG4, pancreatic atrophy, hx  pancreatitis EXAM: MRI ABDOMEN WITHOUT AND WITH CONTRAST (INCLUDING MRCP) TECHNIQUE: Multiplanar multisequence MR imaging of the abdomen was performed both before and after the administration of intravenous contrast. Heavily T2-weighted images of the biliary and pancreatic ducts were obtained, and three-dimensional MRCP images were rendered by post processing. CONTRAST:  5.22mL GADAVIST GADOBUTROL 1 MMOL/ML IV SOLN COMPARISON:  CT scan abdomen and pelvis from 09/24/2022. FINDINGS: Examination is limited due to patient related factors. Lower chest: Unremarkable MR appearance to the lung bases. No pleural effusion. No pericardial effusion. Normal heart size. Hepatobiliary: The liver is normal in size and configuration. No suspicious lesion seen. There are several cysts with largest in the left hepatic lobe, segment 4A measuring 1.2 x 1.7 cm. No intrahepatic or extrahepatic bile duct dilatation. No choledocholithiasis. Unremarkable gallbladder. Pancreas: Limited evaluation of pancreas due to extensive motion. Small/atrophic pancreas is seen with slightly prominent main pancreatic duct measuring up to 3-4 mm. 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 The other lesions are also favored to represent side branch IPMN. No suspicious pancreatic  lesion seen. No peripancreatic soft tissue or fat stranding. Spleen:  Within normal limits in size and appearance. No focal mass. Adrenals/Urinary Tract: Unremarkable adrenal glands. No hydroureteronephrosis. No suspicious renal mass. There are multiple simple cysts in the left kidney. There is a partially exophytic 2.0 x 2.4 cm lesion arising from the left kidney lower pole which exhibits fluid fluid level with dependent T1 hyperintense and T2 hypointense area, favoring proteinaceous/hemorrhagic contents. No abnormal enhancement. Stomach/Bowel: Redemonstration of large hiatal hernia with near complete herniation of stomach through the diaphragmatic defect. Visualized portions within the abdomen are unremarkable. No disproportionate dilation of bowel loops. Vascular/Lymphatic: No pathologically enlarged lymph nodes identified. No abdominal aortic aneurysm demonstrated. No ascites. Other:  None. Musculoskeletal: No suspicious bone lesions identified. Extensive S shaped scoliotic curvature of thoracolumbar spine noted. IMPRESSION: 1. Limited exam. 2. Small/atrophic pancreas with slightly prominent main pancreatic duct measuring up to 3-4 mm. 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 Recommend follow up pre and post contrast MRI/MRCP in 2 years. 3. Partially exophytic 2.4 cm proteinaceous/hemorrhagic cyst arising from the left kidney lower pole. 4. Multiple other nonacute observations, as described above. Electronically Signed   By: Jules Schick M.D.   On: 10/18/2022 15:03     The results of significant diagnostics from this hospitalization (including imaging, microbiology, ancillary and laboratory) are listed below for reference.     Microbiology: No results found for this or any previous  visit (from the past 240 hour(s)).   Labs: BNP (last 3 results) No results for input(s): "BNP" in the last 8760 hours. Basic Metabolic Panel: Recent Labs  Lab 11/03/22 2157 11/04/22 1003 11/05/22 0427  NA 139 135 138  K 4.0 3.9 3.8  CL 105 103 107  CO2 25 24 23   GLUCOSE 139* 98 95  BUN 27* 21 18  CREATININE 1.01* 1.14* 1.28*  CALCIUM 9.8 8.6* 8.7*  MG  --   --  1.6*  PHOS  --   --  3.9   Liver Function Tests: Recent Labs  Lab 11/03/22 2157 11/04/22 1003 11/05/22 0427  AST 14* 18 17  ALT 10 13 12   ALKPHOS 71 79 62  BILITOT 0.3 0.6 0.6  PROT 7.8 6.8 6.3*  ALBUMIN 4.0 3.3* 2.8*   Recent Labs  Lab 11/03/22 2157  LIPASE 75*   No results for input(s): "AMMONIA" in the last 168 hours. CBC: Recent Labs  Lab 11/03/22 2157 11/04/22 1003 11/05/22 0427  WBC 17.8* 13.4* 10.3  HGB 12.1 11.6* 10.7*  HCT 35.1* 34.5* 31.8*  MCV 87.5 89.4 90.6  PLT 374 355 317   Cardiac Enzymes: No results for input(s): "CKTOTAL", "CKMB", "CKMBINDEX", "TROPONINI" in the last 168 hours. BNP: Invalid input(s): "POCBNP" CBG: No results for input(s): "GLUCAP" in the last 168 hours. D-Dimer No results for input(s): "DDIMER" in the last 72 hours. Hgb A1c No results for input(s): "HGBA1C" in the last 72 hours. Lipid Profile No results for input(s): "CHOL", "HDL", "LDLCALC", "TRIG", "CHOLHDL", "LDLDIRECT" in the last 72 hours. Thyroid function studies No results for input(s): "TSH", "T4TOTAL", "T3FREE", "THYROIDAB" in the last 72 hours.  Invalid input(s): "FREET3" Anemia work up No results for input(s): "VITAMINB12", "FOLATE", "FERRITIN", "TIBC", "IRON", "RETICCTPCT" in the last 72 hours. Urinalysis    Component Value Date/Time   COLORURINE YELLOW 11/04/2022 0915   APPEARANCEUR CLEAR 11/04/2022 0915   LABSPEC >1.046 (H) 11/04/2022 0915   PHURINE 5.0 11/04/2022 0915   GLUCOSEU NEGATIVE 11/04/2022 0915   GLUCOSEU NEGATIVE 02/16/2019 1534   HGBUR NEGATIVE 11/04/2022 0915    BILIRUBINUR NEGATIVE 11/04/2022 0915   BILIRUBINUR Negative 10/17/2022 1154   KETONESUR NEGATIVE 11/04/2022 0915   PROTEINUR 30 (A) 11/04/2022 0915   UROBILINOGEN 0.2 10/17/2022 1154   UROBILINOGEN 0.2 02/16/2019 1534   NITRITE NEGATIVE 11/04/2022 0915   LEUKOCYTESUR NEGATIVE 11/04/2022 0915   Sepsis Labs Recent Labs  Lab 11/03/22 2157 11/04/22 1003 11/05/22 0427  WBC 17.8* 13.4* 10.3   Microbiology No results found for this or any previous visit (from the past 240 hour(s)).   Time coordinating discharge:  I have spent 35 minutes face to face with the patient and on the ward discussing the patients care, assessment, plan and disposition with other care givers. >50% of the time was devoted counseling the patient about the risks and benefits of treatment/Discharge disposition and coordinating care.   SIGNED:   Miguel Rota, MD  [facility] 11/05/2022, 10:44 AM   If 7PM-7AM, please contact night-coverage

## 2022-11-05 NOTE — TOC Transition Note (Signed)
Transition of Care [facility]) - CM/SW Discharge Note   Patient Details  Name: [name] MRN: [id number] Date of Birth: Jun 21, 1945  [facility] CM/SW Contact:  Tom-Johnson, Hershal Coria, RN Phone Number: 11/05/2022, 11:11 AM   Clinical Narrative:     Patient is scheduled for discharge today.  Readmission Risk Assessment done. Outpatient f/u, hospital f/u and discharge instructions on AVS. No TOC needs or recommendations noted. Husband, [name] to transport at discharge.  No further TOC needs noted.        Final next level of care: Home/Self Care Barriers to Discharge: Barriers Resolved   Patient Goals and CMS Choice CMS Medicare.gov Compare Post Acute Care list provided to:: Patient Choice offered to / list presented to : NA  Discharge Placement                  Patient to be transferred to facility by: Husband Name of family member notified: [name]    Discharge Plan and Services Additional resources added to the After Visit Summary for                  DME Arranged: N/A DME Agency: NA       HH Arranged: NA HH Agency: NA        Social Determinants of Health (SDOH) Interventions SDOH Screenings   Food Insecurity: No Food Insecurity (11/04/2022)  Housing: Low Risk  (11/04/2022)  Transportation Needs: No Transportation Needs (11/04/2022)  Utilities: Not At Risk (11/04/2022)  Depression (PHQ2-9): Low Risk  (09/10/2022)  Financial Resource Strain: Low Risk  (04/16/2022)  Physical Activity: Insufficiently Active (04/16/2022)  Social Connections: Unknown (08/04/2022)   Received from [facility]  Stress: No Stress Concern Present (04/16/2022)  Tobacco Use: Medium Risk (11/05/2022)  Health Literacy: Unknown (08/04/2022)   Received from [facility]     Readmission Risk Interventions    11/04/2022    3:48 PM  Readmission Risk Prevention Plan  Transportation Screening Complete  PCP or Specialist Appt within 5-7  Days Complete  Home Care Screening Complete  Medication Review (RN CM) Referral to Pharmacy

## 2022-11-05 NOTE — Telephone Encounter (Signed)
-----   Message from Mike Gip sent at 11/05/2022  9:44 AM EDT ----- Regarding: surgery referral to Dr Dossie Der for repair of huge hiatal hernia, and intrathoracic stomach [name], this patient is currently hospitalized should be discharged today, has been having episodes of severe abdominal pain with radiation to the left shoulder, and nausea and vomiting.  Currently getting episodes about once per month She has had workup this admission, including EGD today and her symptoms are very consistent with pain secondary to torsion from her huge hiatal hernia and intrathoracic stomach.  Dr. Marina Goodell just FYI she has had some recent outpatient workup per [facility].  Please get her a surgical referral appointment as soon as possible with Dr. Dossie Der he has seen previously.  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

## 2022-11-05 NOTE — Anesthesia Postprocedure Evaluation (Signed)
Anesthesia Post Note  Patient: [name]  Procedure(s) Performed: ESOPHAGOGASTRODUODENOSCOPY (EGD) WITH PROPOFOL BIOPSY     Patient location during evaluation: PACU Anesthesia Type: MAC Level of consciousness: awake and alert Pain management: pain level controlled Vital Signs Assessment: post-procedure vital signs reviewed and stable Respiratory status: spontaneous breathing, nonlabored ventilation, respiratory function stable and patient connected to nasal cannula oxygen Cardiovascular status: stable and blood pressure returned to baseline Postop Assessment: no apparent nausea or vomiting Anesthetic complications: no  No notable events documented.  Last Vitals:  Vitals:   11/05/22 0935 11/05/22 1013  BP: (!) 158/86 (!) 161/87  Pulse: 80 76  Resp: 13 18  Temp:  36.5 C  SpO2: 94% 98%    Last Pain:  Vitals:   11/05/22 0935  TempSrc:   PainSc: 0-No pain                 Kennieth Rad

## 2022-11-05 NOTE — Op Note (Signed)
[facility] Patient Name: [name] Procedure Date : 11/05/2022 MRN: [id number] Attending MD: Beverley Fiedler , MD, [id number] Date of Birth: Mar 20, 1945 CSN: [id number] Age: 77 Admit Type: Inpatient Procedure:                Upper GI endoscopy Indications:              Epigastric abdominal pain (episodic), Nausea with                            vomiting Providers:                Carie Caddy. Rhea Belton, MD, Fransisca Connors, Melany Guernsey, Technician Referring MD:             [facility] Medicines:                Monitored Anesthesia Care Complications:            No immediate complications. Estimated Blood Loss:     Estimated blood loss was minimal. Procedure:                Pre-Anesthesia Assessment:                           - Prior to the procedure, a History and Physical                            was performed, and patient medications and                            allergies were reviewed. The patient's tolerance of                            previous anesthesia was also reviewed. The risks                            and benefits of the procedure and the sedation                            options and risks were discussed with the patient.                            All questions were answered, and informed consent                            was obtained. Prior Anticoagulants: The patient has                            taken no anticoagulant or antiplatelet agents. ASA                            Grade Assessment: III - A patient with severe  systemic disease. After reviewing the risks and                            benefits, the patient was deemed in satisfactory                            condition to undergo the procedure.                           After obtaining informed consent, the endoscope was                            passed under direct vision. Throughout the                             procedure, the patient's blood pressure, pulse, and                            oxygen saturations were monitored continuously. The                            [id number]-H190 ([id number]) Olympus endoscope was introduced                            through the mouth, and advanced to the second part                            of duodenum. The upper GI endoscopy was                            accomplished without difficulty. The patient                            tolerated the procedure well. Scope In: Scope Out: Findings:      The middle third of the esophagus and lower third of the esophagus were       significantly tortuous. This is secondary to the large hiatal hernia.      A large hiatal hernia was found. The proximal extent of the gastric       folds (end of tubular esophagus) was 32 cm from the incisors. The hiatal       narrowing was 41 cm from the incisors. A significant portion (>50%) of       the stomach is intrathoracic. The hernia causes deformity of the gastric       lumen with narrowing as you approach the antrum (without obstruction).      Diffuse moderate mucosal changes characterized by congestion and       erythema were found in the gastric fundus and in the gastric body. This       is likely secondary to the hiatal hernia. Biopsies were taken with a       cold forceps for histology and Helicobacter pylori testing.      The examined duodenum was normal. Impression:               - Tortuous esophagus.                           -  Large hiatal hernia. Hernia creates deformity in                            the gastric lumen (as described above). >50% of                            stomach is intrathoracic.                           - Congested and erythematous mucosa in the gastric                            fundus and gastric body. Most likely secondary to                            hiatal hernia. Biopsied.                           - Normal examined duodenum. Moderate Sedation:       N/A Recommendation:           - Return patient to hospital ward for possible                            discharge same day.                           - Advance diet as tolerated.                           - Continue present medications. Daily PPI. Would                            discharge with Zofran ODT 4-8 mg every 8 hours and                            small supply of oxycodone 5 mg to have available in                            the event of recurrent symptoms (this may prevent                            recurrent ER visit until surgery can be discussed).                           - Await pathology results.                           - Outpatient followup with Dr. Dossie Der to                            rediscuss repair of hiatal hernia (acute abdominal                            pain with nausea and vomiting felt secondary to  symptomatic hiatal hernia).                           - Outpatient follow-up after surgical consultation                            with Dr. Marina Goodell is recommended (Crohn's disease,                            chronic diarrhea, likely EPI). Procedure Code(s):        --- Professional ---                           [phone], Esophagogastroduodenoscopy, flexible,                            transoral; with biopsy, single or multiple Diagnosis Code(s):        --- Professional ---                           Q39.9, Congenital malformation of esophagus,                            unspecified                           K44.9, Diaphragmatic hernia without obstruction or                            gangrene                           K31.89, Other diseases of stomach and duodenum                           R10.13, Epigastric pain                           R11.2, Nausea with vomiting, unspecified CPT copyright 2022 American Medical Association. All rights reserved. The codes documented in this report are preliminary and upon coder review may  be revised  to meet current compliance requirements. Beverley Fiedler, MD 11/05/2022 9:32:55 AM This report has been signed electronically. Number of Addenda: 0

## 2022-11-05 NOTE — Anesthesia Preprocedure Evaluation (Signed)
Anesthesia Evaluation  Patient identified by MRN, date of birth, ID band Patient awake    Reviewed: Allergy & Precautions, NPO status , Patient's Chart, lab work & pertinent test results  History of Anesthesia Complications (+) PONV and history of anesthetic complications  Airway Mallampati: II  TM Distance: >3 FB Neck ROM: Full    Dental  (+) Dental Advisory Given   Pulmonary COPD, former smoker   breath sounds clear to auscultation       Cardiovascular hypertension, Pt. on medications  Rhythm:Regular Rate:Normal     Neuro/Psych negative neurological ROS     GI/Hepatic Neg liver ROS, hiatal hernia,GERD  ,,  Endo/Other  negative endocrine ROS    Renal/GU CRFRenal disease     Musculoskeletal  (+) Arthritis , Rheumatoid disorders,    Abdominal   Peds  Hematology negative hematology ROS (+)   Anesthesia Other Findings   Reproductive/Obstetrics                             Anesthesia Physical Anesthesia Plan  ASA: 3  Anesthesia Plan: MAC   Post-op Pain Management:    Induction:   PONV Risk Score and Plan: 3 and Propofol infusion  Airway Management Planned: Natural Airway and Simple Face Mask  Additional Equipment:   Intra-op Plan:   Post-operative Plan:   Informed Consent: I have reviewed the patients History and Physical, chart, labs and discussed the procedure including the risks, benefits and alternatives for the proposed anesthesia with the patient or authorized representative who has indicated his/her understanding and acceptance.       Plan Discussed with:   Anesthesia Plan Comments:        Anesthesia Quick Evaluation

## 2022-11-05 NOTE — Plan of Care (Signed)

## 2022-11-05 NOTE — Hospital Course (Addendum)
  Brief Narrative:  77 year old with history of chronic diarrhea, Crohn's colitis, large symptomatic paraesophageal hernia, IBS, GERD, HTN, CKD stage IIIa, rheumatoid arthritis comes to the ED with complaints of nausea vomiting and abdominal pain started on the day of admission.  CT scan showed transverse colitis.  GI team was consulted and due to ongoing chronic abdominal pain in the epigastric area, EGD planned on 10/29.Endoscopy showed torturous esophagus with large hiatal hernia.  It was recommended that she follows up outpatient with GI and general surgery to possibly rediscuss repair of her hiatal hernia. For now patient is tolerating p.o.  Will discharge her with antiemetics, pain medication and bowel regimen supply. Instructions given by me at bedside.  Husband also present at bedside.     Assessment & Plan:  Principal Problem:   Acute colitis Active Problems:   Hypertensive crisis   Chronic diarrhea   Hyperlipidemia   CKD (chronic kidney disease) stage 3, GFR 30-59 ml/min (HCC)   GERD (gastroesophageal reflux disease)   Crohn's disease (HCC)   Hiatal hernia   Emphysema lung (HCC)   IBS (irritable bowel syndrome)   Leukocytosis   Generalized anxiety disorder   Transverse colitis seen in the setting of Crohn's disease. - Initially thought to be sepsis but no obvious evidence of infection.  This is likely secondary to inflammatory bowel disease..  This inflammation is likely related to her Crohn's, will discontinue antibiotics.  Diarrhea has resolved.  Status post endoscopy today.  She is tolerating p.o., continue home mesalamine.   Chronic pancreatitis Large hiatal hernia Continue Creon 3 times daily.  Endoscopy showing tortuous esophagus with large hiatal hernia.  Will discharge patient on pain medication and antiemetics.  Will discuss further options with outpatient general surgery and GI.   Chronic diarrhea On colestipol and Creon   IBS-diarrhea predominance Continue Bentyl  as needed   CKD 3 A Creatinine around baseline 1.0   Essential hypertension Hypertensive crisis; improved Resume her home regimen   GERD Continue daily PPI before meals   Emphysematous lung disease No active exacerbation.  As needed bronchodilators   Generalized anxiety disorder Continue Cymbalta     DVT prophylaxis: Lovenox Code Status: Full code Family Communication: Spouse present at bedside Feeling much better after endoscopy.  No other complaints.  Will discharge her today   Subjective: Feeling well, wanting to go home.     Examination:   General exam: Appears calm and comfortable  Respiratory system: Clear to auscultation. Respiratory effort normal. Cardiovascular system: S1 & S2 heard, RRR. No JVD, murmurs, rubs, gallops or clicks. No pedal edema. Gastrointestinal system: Abdomen is nondistended, soft and nontender. No organomegaly or masses felt. Normal bowel sounds heard. Central nervous system: Alert and oriented. No focal neurological deficits. Extremities: Symmetric 5 x 5 power. Skin: No rashes, lesions or ulcers Psychiatry: Judgement and insight appear normal. Mood & affect appropriate.

## 2022-11-05 NOTE — Progress Notes (Signed)
DISCHARGE NOTE HOME Odilia I Trueba to be discharged Home per MD order. Discussed prescriptions and follow up appointments with the patient. Prescriptions given to patient; medication list explained in detail. Patient verbalized understanding.  Skin clean, dry and intact without evidence of skin break down, no evidence of skin tears noted. IV catheter discontinued intact. Site without signs and symptoms of complications. Dressing and pressure applied. Pt denies pain at the site currently. No complaints noted.  Patient free of lines, drains, and wounds.   An After Visit Summary (AVS) was printed and given to the patient. Patient escorted via wheelchair, and discharged home via private auto.  Irwin Brakeman, RN

## 2022-11-05 NOTE — Interval H&P Note (Signed)
History and Physical Interval Note: For EGD today to eval recurrent epi pain and large HH The nature of the procedure, as well as the risks, benefits, and alternatives were carefully and thoroughly reviewed with the patient. Ample time for discussion and questions allowed. The patient understood, was satisfied, and agreed to proceed.   11/05/2022 8:43 AM  [name]  has presented today for surgery, with the diagnosis of Recurrent upper abdominal pain nausea and vomiting, huge hiatal hernia, intrathoracic.  The various methods of treatment have been discussed with the patient and family. After consideration of risks, benefits and other options for treatment, the patient has consented to  Procedure(s): ESOPHAGOGASTRODUODENOSCOPY (EGD) WITH PROPOFOL (N/A) as a surgical intervention.  The patient's history has been reviewed, patient examined, no change in status, stable for surgery.  I have reviewed the patient's chart and labs.  Questions were answered to the patient's satisfaction.     Carie Caddy [name]

## 2022-11-05 NOTE — Plan of Care (Signed)

## 2022-11-05 NOTE — Telephone Encounter (Signed)
Referral faxed to [facility] as requested.

## 2022-11-06 ENCOUNTER — Telehealth: Payer: Self-pay

## 2022-11-06 LAB — SURGICAL PATHOLOGY

## 2022-11-06 NOTE — Transitions of Care (Post Inpatient/ED Visit) (Signed)
11/06/2022  Name: [name] MRN: [id number] DOB: May 11, 1945  Today's TOC FU Call Status: Today's TOC FU Call Status:: Unsuccessful Call (1st Attempt) Unsuccessful Call (1st Attempt) Date: 11/06/22  Attempted to reach the patient regarding the most recent Inpatient/ED visit.  Follow Up Plan: Additional outreach attempts will be made to reach the patient to complete the Transitions of Care (Post Inpatient/ED visit) call.     Antionette Fairy, RN,BSN,CCM RN Care Manager Transitions of Care  [place]  Direct Phone: [phone] Toll Free: [phone] Fax: [phone]

## 2022-11-07 ENCOUNTER — Telehealth: Payer: Self-pay

## 2022-11-07 NOTE — Transitions of Care (Post Inpatient/ED Visit) (Signed)
11/07/2022  Name: [name] MRN: [id number] DOB: [birth year]  Today's TOC FU Call Status: Today's TOC FU Call Status:: Unsuccessful Call (2nd Attempt) Unsuccessful Call (2nd Attempt) Date: 11/07/22  Attempted to reach the patient regarding the most recent Inpatient/ED visit.  Follow Up Plan: Additional outreach attempts will be made to reach the patient to complete the Transitions of Care (Post Inpatient/ED visit) call.      Antionette Fairy, RN,BSN,CCM RN Care Manager Transitions of Care  [place]  Direct Phone: [phone] Toll Free: [phone] Fax: [phone]

## 2022-11-07 NOTE — Transitions of Care (Post Inpatient/ED Visit) (Signed)
11/07/2022  Name: [name] MRN: [id number] DOB: August 20, 1945  Today's TOC FU Call Status: Today's TOC FU Call Status:: Successful TOC FU Call Completed TOC FU Call Complete Date: 11/07/22 Patient's Name and Date of Birth confirmed.  Transition Care Management Follow-up Telephone Call Date of Discharge: 11/05/22 Discharge Facility: [facility]) Type of Discharge: Inpatient Admission Primary Inpatient Discharge Diagnosis:: "colitis" How have you been since you were released from the hospital?: Better (Pt states she is doing good-no abd pain-being careful what she is eating-denies any N&V, appetite is fair, LBM was today-states stools "mushy but not watery") Any questions or concerns?: No  Items Reviewed: Did you receive and understand the discharge instructions provided?: Yes Medications obtained,verified, and reconciled?: Partial Review Completed Reason for Partial Mediation Review: reviewed med changes with pt Any new allergies since your discharge?: No Dietary orders reviewed?: Yes Type of Diet Ordered:: low salt/heart healthy Do you have support at home?: Yes People in Home: spouse Name of Support/Comfort Primary Source: [name]  Medications Reviewed Today: Medications Reviewed Today     Reviewed by Charlyn Minerva, RN (Registered Nurse) on 11/07/22 at 1411  Med List Status: <None>   Medication Order Taking? Sig Documenting Provider Last Dose Status Informant  albuterol (PROVENTIL) (2.5 MG/3ML) 0.083% nebulizer solution [id number]  Take 3 mLs (2.5 mg total) by nebulization every 6 (six) hours as needed for wheezing or shortness of breath. April Chapel, NP  Active Self, Pharmacy Records  albuterol (VENTOLIN HFA) 108 (90 Base) MCG/ACT inhaler [id number]  Inhale 2 puffs into the lungs every 6 (six) hours as needed for wheezing or shortness of breath. April Chapel, NP  Active Self, Pharmacy Records  bisacodyl (DULCOLAX) 5 MG EC tablet [id number] Yes Take 1  tablet (5 mg total) by mouth daily as needed for moderate constipation or severe constipation. April Rota, MD Taking Active   budesonide-formoterol [facility]) 160-4.5 MCG/ACT inhaler [id number]  Inhale 2 puffs into the lungs in the morning and at bedtime. April Chapel, NP  Active Self, Pharmacy Records  Calcium Citrate-Vitamin D (CALCIUM + D PO) [id number]  Take 1 tablet by mouth 2 (two) times daily. [provider]  Active Self, Pharmacy Records  cetirizine (ZYRTEC) 10 MG tablet [id number]  Take 1 tablet (10 mg total) by mouth daily. [name], April Patricia, MD  Active Self, Pharmacy Records  colestipol (COLESTID) 1 g tablet [id number]  Take 2 tablets (2 g total) by mouth 2 (two) times daily. April Fredrickson, MD  Active Self, Pharmacy Records  dicyclomine (BENTYL) 20 MG tablet [id number]  Take 1 tablet (20 mg total) by mouth 4 (four) times daily as needed for spasms. April Fredrickson, MD  Active Self, Pharmacy Records  diphenoxylate-atropine (LOMOTIL) 2.5-0.025 MG tablet [id number]  TAKE 2 TABLETS IN THE MORNING, AT NOON AND AT BEDTIME AS DIRECTED April Fredrickson, MD  Active Self, Pharmacy Records  DULoxetine (CYMBALTA) 60 MG capsule [id number]  Take 1 capsule (60 mg total) by mouth 2 (two) times daily. [name], April Patricia, MD  Active Self, Pharmacy Records  fluticasone [facility]) 50 MCG/ACT nasal spray [id number]  Place 2 sprays into both nostrils daily. [name], April Patricia, MD  Active Self, Pharmacy Records  furosemide (LASIX) 20 MG tablet [id number]  Take 1 tablet (20 mg total) by mouth daily as needed for edema. [name], April Patricia, MD  Active Self, Pharmacy Records  lipase/protease/amylase (CREON) 36000 UNITS CPEP capsule [id number]  Take 2 capsules by  mouth with each meal, and take 1 capsule by mouth with each snack (max 8 capsules daily).  Patient not taking: Reported on 11/04/2022   [name]  Active Self, Pharmacy Records  loperamide (IMODIUM) 2 MG  capsule [id number]  TAKE 1 CAPSULE BY MOUTH THREE TIMES A DAY AS NEEDED FOR DIARRHEA OR LOOSE STOOLS April Fredrickson, MD  Active Self, Pharmacy Records  losartan (COZAAR) 100 MG tablet [id number]  Take 1 tablet (100 mg total) by mouth daily. [name], April Patricia, MD  Active Self, Pharmacy Records  mesalamine (LIALDA) 1.2 g EC tablet [id number]  Take 4 tablets (4.8 g total) by mouth daily with breakfast. April Fredrickson, MD  Active Self, Pharmacy Records  Multiple Vitamin (MULTIVITAMIN WITH MINERALS) TABS tablet [id number]  Take 1 tablet by mouth daily. [provider]  Active Self, Pharmacy Records  omeprazole (PRILOSEC) 40 MG capsule [id number] Yes Take 1 capsule (40 mg total) by mouth daily before breakfast. TAKE 1 CAPSULE (40 MG TOTAL) BY MOUTH IN THE MORNING, AT NOON, AND AT BEDTIME. April Rota, MD Taking Active   ondansetron (ZOFRAN) 4 MG tablet [id number] Yes Take 1 tablet (4 mg total) by mouth every 8 (eight) hours as needed for nausea or vomiting. April Rota, MD Taking Active   oxycodone (OXY-IR) 5 MG capsule [id number] Yes Take 1 capsule (5 mg total) by mouth every 6 (six) hours as needed for pain. April Rota, MD Taking Active   valACYclovir (VALTREX) 1000 MG tablet [id number]  TAKE 1 TABLET (1,000 MG TOTAL) BY MOUTH TWICE A DAY AS NEEDED [name], April Patricia, MD  Active Self, Pharmacy Records            Home Care and Equipment/Supplies: Were Home Health Services Ordered?: NA Any new equipment or medical supplies ordered?: NA  Functional Questionnaire: Do you need assistance with bathing/showering or dressing?: No Do you need assistance with meal preparation?: No Do you need assistance with eating?: No Do you have difficulty maintaining continence: No Do you need assistance with getting out of bed/getting out of a chair/moving?: No Do you have difficulty managing or taking your medications?: No  Follow up appointments reviewed: PCP Follow-up appointment  confirmed?: No (pt declined) Specialist Hospital Follow-up appointment confirmed?: Yes Date of Specialist follow-up appointment?: 11/21/22 Follow-Up Specialty Provider:: Dr. Marina Goodell, surgeon appt-11/14/22 Do you need transportation to your follow-up appointment?: No Do you understand care options if your condition(s) worsen?: Yes-patient verbalized understanding  SDOH Interventions Today    Flowsheet Row Most Recent Value  SDOH Interventions   Food Insecurity Interventions Intervention Not Indicated  Transportation Interventions Intervention Not Indicated       Antionette Fairy, RN,BSN,CCM RN Care Manager Transitions of Care  [place]  Direct Phone: [phone] Toll Free: [phone] Fax: [phone]

## 2022-11-13 ENCOUNTER — Encounter: Payer: Self-pay | Admitting: Internal Medicine

## 2022-11-14 ENCOUNTER — Other Ambulatory Visit: Payer: Self-pay | Admitting: Internal Medicine

## 2022-11-14 DIAGNOSIS — J302 Other seasonal allergic rhinitis: Secondary | ICD-10-CM

## 2022-11-21 ENCOUNTER — Encounter: Payer: Self-pay | Admitting: Internal Medicine

## 2022-11-21 ENCOUNTER — Ambulatory Visit: Payer: Medicare Other | Admitting: Internal Medicine

## 2022-11-21 VITALS — BP 126/70 | HR 88 | Ht 60.0 in | Wt 111.1 lb

## 2022-11-21 DIAGNOSIS — K219 Gastro-esophageal reflux disease without esophagitis: Secondary | ICD-10-CM | POA: Diagnosis not present

## 2022-11-21 DIAGNOSIS — K449 Diaphragmatic hernia without obstruction or gangrene: Secondary | ICD-10-CM | POA: Diagnosis not present

## 2022-11-21 DIAGNOSIS — K589 Irritable bowel syndrome without diarrhea: Secondary | ICD-10-CM

## 2022-11-21 DIAGNOSIS — K5010 Crohn's disease of large intestine without complications: Secondary | ICD-10-CM | POA: Diagnosis not present

## 2022-11-21 MED ORDER — MESALAMINE 1.2 G PO TBEC: 4.8000 g | DELAYED_RELEASE_TABLET | Freq: Every day | ORAL | 3 refills | Status: DC

## 2022-11-21 MED ORDER — PANCRELIPASE (LIP-PROT-AMYL) 36000-114000 UNITS PO CPEP: ORAL_CAPSULE | ORAL | 3 refills | Status: DC

## 2022-11-21 MED ORDER — BUDESONIDE 3 MG PO CPEP: 9.0000 mg | ORAL_CAPSULE | Freq: Every day | ORAL | 6 refills | Status: DC

## 2022-11-21 MED ORDER — COLESTIPOL HCL 1 G PO TABS: 2.0000 g | ORAL_TABLET | Freq: Two times a day (BID) | ORAL | 3 refills | Status: DC

## 2022-11-21 MED ORDER — DIPHENOXYLATE-ATROPINE 2.5-0.025 MG PO TABS: ORAL_TABLET | ORAL | 3 refills | Status: DC

## 2022-11-21 MED ORDER — LOPERAMIDE HCL 2 MG PO CAPS: 2.0000 mg | ORAL_CAPSULE | Freq: Three times a day (TID) | ORAL | 6 refills | Status: DC | PRN

## 2022-11-21 MED ORDER — OMEPRAZOLE 40 MG PO CPDR: 40.0000 mg | DELAYED_RELEASE_CAPSULE | Freq: Every day | ORAL | 3 refills | Status: DC

## 2022-11-21 MED ORDER — DICYCLOMINE HCL 20 MG PO TABS: 20.0000 mg | ORAL_TABLET | Freq: Four times a day (QID) | ORAL | 3 refills | Status: AC | PRN

## 2022-11-21 NOTE — Patient Instructions (Addendum)
We have sent the following medications to your pharmacy for you to pick up at your convenience:  I will call [facility] to see about your appointment.  _______________________________________________________  If your blood pressure at your visit was 140/90 or greater, please contact your primary care physician to follow up on this.  _______________________________________________________  If you are age 77 or older, your body mass index should be between 23-30. Your Body mass index is 21.7 kg/m. If this is out of the aforementioned range listed, please consider follow up with your Primary Care Provider.  If you are age 58 or younger, your body mass index should be between 19-25. Your Body mass index is 21.7 kg/m. If this is out of the aformentioned range listed, please consider follow up with your Primary Care Provider.   ________________________________________________________  The [facility] providers would like to encourage you to use [facility] to communicate with providers for non-urgent requests or questions.  Due to long hold times on the telephone, sending your provider a message by [facility] may be a faster and more efficient way to get a response.  Please allow 48 business hours for a response.  Please remember that this is for non-urgent requests.  _______________________________________________________
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
## 2022-12-23 ENCOUNTER — Telehealth: Payer: Self-pay | Admitting: Internal Medicine

## 2022-12-23 NOTE — Telephone Encounter (Signed)
Prescription Request  12/23/2022  LOV: 10/17/2022  What is the name of the medication or equipment?  valACYclovir (VALTREX) 1000 MG tablet   Have you contacted your pharmacy to request a refill? No  [id number] [place], [place] - [address] Phone: [phone]  Fax: [phone]     Which pharmacy would you like this sent to?  Patient notified that their request is being sent to the clinical staff for review and that they should receive a response within 2 business days.   Please advise at Mobile [phone] (mobile)

## 2022-12-27 ENCOUNTER — Encounter: Payer: Self-pay | Admitting: Internal Medicine

## 2022-12-27 DIAGNOSIS — B001 Herpesviral vesicular dermatitis: Secondary | ICD-10-CM

## 2022-12-30 ENCOUNTER — Other Ambulatory Visit: Payer: Self-pay | Admitting: Internal Medicine

## 2022-12-30 DIAGNOSIS — I10 Essential (primary) hypertension: Secondary | ICD-10-CM

## 2022-12-30 MED ORDER — VALACYCLOVIR HCL 1 G PO TABS: ORAL_TABLET | ORAL | 1 refills | Status: DC

## 2023-01-02 ENCOUNTER — Ambulatory Visit: Payer: Medicare Other | Admitting: Pulmonary Disease

## 2023-01-02 ENCOUNTER — Encounter: Payer: Self-pay | Admitting: Pulmonary Disease

## 2023-01-02 VITALS — BP 107/68 | HR 105 | Temp 98.6°F | Ht 60.0 in | Wt 111.2 lb

## 2023-01-02 DIAGNOSIS — Z87891 Personal history of nicotine dependence: Secondary | ICD-10-CM

## 2023-01-02 DIAGNOSIS — J441 Chronic obstructive pulmonary disease with (acute) exacerbation: Secondary | ICD-10-CM | POA: Diagnosis not present

## 2023-01-02 MED ORDER — AZITHROMYCIN 250 MG PO TABS: ORAL_TABLET | ORAL | 0 refills | Status: DC

## 2023-01-02 MED ORDER — BREZTRI AEROSPHERE 160-9-4.8 MCG/ACT IN AERO: 2.0000 | INHALATION_SPRAY | Freq: Two times a day (BID) | RESPIRATORY_TRACT | Status: DC

## 2023-01-02 MED ORDER — PREDNISONE 10 MG PO TABS: ORAL_TABLET | ORAL | 0 refills | Status: AC
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
## 2023-01-02 NOTE — Patient Instructions (Addendum)
Start prednisone taper - 40mg  daily x 3 days, 30mg  daily x 3 days, 20mg  daily x 3 days, 10mg  daily x 3 days  Start Zpak antibiotic  Try Breztri inhaler 2 puffs twice daily over the next month - rinse mouth out after each use  Stop symbicort while using breztri  Follow up on 1/21 via video visit to check in before surgery

## 2023-01-03 ENCOUNTER — Other Ambulatory Visit: Payer: Self-pay | Admitting: Internal Medicine

## 2023-01-03 DIAGNOSIS — R609 Edema, unspecified: Secondary | ICD-10-CM

## 2023-01-05 ENCOUNTER — Encounter (HOSPITAL_BASED_OUTPATIENT_CLINIC_OR_DEPARTMENT_OTHER): Payer: Medicare Other | Admitting: General Surgery

## 2023-01-13 ENCOUNTER — Other Ambulatory Visit: Payer: Self-pay | Admitting: Internal Medicine

## 2023-01-13 DIAGNOSIS — J302 Other seasonal allergic rhinitis: Secondary | ICD-10-CM

## 2023-01-16 ENCOUNTER — Ambulatory Visit: Payer: Self-pay | Admitting: Surgery

## 2023-01-21 NOTE — Progress Notes (Addendum)
COVID Vaccine received:  []  No [x]  Yes Date of any COVID positive Test in last 90 days: None  PCP - Chaya Jan, MD Cardiologist -  Pulmonology-  Melody Comas, MD  clearance in 01-02-2023 note  Chest x-ray - 12-17-2019  2v  CEW EKG -  11-03-2022  Epic Stress Test -  ECHO -  Cardiac Cath -   PCR screen: [x]  Ordered & Completed   HX MRSA 12-2021  leg wound []   No Order but Needs PROFEND     []   N/A for this surgery  Surgery Plan:  []  Ambulatory   [x]  Outpatient in bed  []  Admit Anesthesia:    [x]  General  []  Spinal  []   Choice []   MAC  Bowel Prep - [x]  No  []   Yes ______  Pacemaker / ICD device [x]  No []  Yes   Spinal Cord Stimulator:[x]  No []  Yes       History of Sleep Apnea? [x]  No []  Yes   CPAP used?- [x]  No []  Yes    Does the patient monitor blood sugar?   [x]  N/A   []  No []  Yes  Patient has: [x]  NO Hx DM   []  Pre-DM   []  DM1  []   DM2  Blood Thinner / Instructions:  none Aspirin Instructions:  none  ERAS Protocol Ordered: [x]  No  []  Yes PRE-SURGERY []  ENSURE  []  G2   [x]  No Drink Ordered Patient is to be NPO after: 0630  Dental hx: []  Dentures:  [x]  N/A      []  Bridge or Partial:                   []  Loose or Damaged teeth:   Activity level: Patient is able to climb a flight of stairs without difficulty; [x]  No CP  but would have SOB  Patient can perform ADLs without assistance.   Anesthesia review: HTN, GERD, Crohns', RA, CKD3, pancreatic insuff.  Emphysema- restrictive lung disease, GAD, PONV, HOH-  HAs, Scoliosis.   Patient denies shortness of breath, fever, cough and chest pain at PAT appointment.  Patient verbalized understanding and agreement to the Pre-Surgical Instructions that were given to them at this PAT appointment. Patient was also educated of the need to review these PAT instructions again prior to her surgery.I reviewed the appropriate phone numbers to call if they have any and questions or concerns.

## 2023-01-21 NOTE — Patient Instructions (Addendum)
SURGICAL WAITING ROOM VISITATION Patients having surgery or a procedure may have no more than 2 support people in the waiting area - these visitors may rotate in the visitor waiting room.   Due to an increase in RSV and influenza rates and associated hospitalizations, children ages 56 and under may not visit patients in [facility] hospitals. If the patient needs to stay at the hospital during part of their recovery, the visitor guidelines for inpatient rooms apply.  PRE-OP VISITATION  Pre-op nurse will coordinate an appropriate time for 1 support person to accompany the patient in pre-op.  This support person may not rotate.  This visitor will be contacted when the time is appropriate for the visitor to come back in the pre-op area.  Please refer to the [facility] website for the visitor guidelines for Inpatients (after your surgery is over and you are in a regular room).  You are not required to quarantine at this time prior to your surgery. However, you must do this: Hand Hygiene often Do NOT share personal items Notify your provider if you are in close contact with someone who has COVID or you develop fever 100.4 or greater, new onset of sneezing, cough, sore throat, shortness of breath or body aches.  If you test positive for Covid or have been in contact with anyone that has tested positive in the last 10 days please notify you surgeon.    Your procedure is scheduled on:  FRIDAY  January 31, 2023  Report to [facility] Main Entrance: Leota Jacobsen entrance where the [employer] is available.   Report to admitting at: 07:15    AM  Call this number if you have any questions or problems the morning of surgery [phone]  Do not eat food after Midnight the night prior to your surgery/procedure.  After Midnight you may have the following liquids until  06:30 AM DAY OF SURGERY  Clear Liquid Diet Water Black Coffee (sugar ok, NO MILK/CREAM OR CREAMERS)  Tea (sugar ok, NO  MILK/CREAM OR CREAMERS) regular and decaf                             Plain Jell-O  with no fruit (NO RED)                                           Fruit ices (not with fruit pulp, NO RED)                                     Popsicles (NO RED)                                                                  Juice: NO CITRUS JUICES: only apple, WHITE grape, WHITE cranberry Sports drinks like Gatorade or Powerade (NO RED)                FOLLOW ANY ADDITIONAL PRE OP INSTRUCTIONS YOU RECEIVED FROM YOUR SURGEON'S OFFICE!!!   Oral Hygiene is also important to reduce your  risk of infection.        Remember - BRUSH YOUR TEETH THE MORNING OF SURGERY WITH YOUR REGULAR TOOTHPASTE  Do NOT smoke after Midnight the night before surgery.   STOP TAKING all Vitamins, Herbs and supplements 1 week before your surgery.   Mesalamine (Lialda)- stop taking 24 hours prior to surgery;  last dose will be taken on Wednesday 01-29-23  Take ONLY these medicines the morning of surgery with A SIP OF WATER: omeprazole, cetirizine, duloxetine.  You may use your Flonase nasal spray if needed.  You may use your inhalers and nebulizers if needed.   You may not have any metal on your body including hair pins, jewelry, and body piercing  Do not wear make-up, lotions, powders, perfumes or deodorant  Do not wear nail polish including gel and S&S, artificial / acrylic nails, or any other type of covering on natural nails including finger and toenails. If you have artificial nails, gel coating, etc., that needs to be removed by a nail salon, Please have this removed prior to surgery. Not doing so may mean that your surgery could be cancelled or delayed if the Surgeon or anesthesia staff feels like they are unable to monitor you safely.   Do not shave 48 hours prior to surgery to avoid nicks in your skin which may contribute to postoperative infections.   Contacts, Hearing Aids, dentures or bridgework may not be worn into  surgery. DENTURES WILL BE REMOVED PRIOR TO SURGERY PLEASE DO NOT APPLY "Poly grip" OR ADHESIVES!!!  You may bring a small overnight bag with you on the day of surgery, only pack items that are not valuable. [facility] IS NOT RESPONSIBLE   FOR VALUABLES THAT ARE LOST OR STOLEN.   Do not bring your home medications to the hospital. The Pharmacy will dispense medications listed on your medication list to you during your admission in the Hospital.  Please read over the following fact sheets you were given: IF YOU HAVE QUESTIONS ABOUT YOUR PRE-OP INSTRUCTIONS, PLEASE CALL [phone]   [facility] - Preparing for Surgery Before surgery, you can play an important role.  Because skin is not sterile, your skin needs to be as free of germs as possible.  You can reduce the number of germs on your skin by washing with CHG (chlorahexidine gluconate) soap before surgery.  CHG is an antiseptic cleaner which kills germs and bonds with the skin to continue killing germs even after washing. Please DO NOT use if you have an allergy to CHG or antibacterial soaps.  If your skin becomes reddened/irritated stop using the CHG and inform your nurse when you arrive at Short Stay. Do not shave (including legs and underarms) for at least 48 hours prior to the first CHG shower.  You may shave your face/neck.  Please follow these instructions carefully:  1.  Shower with CHG Soap the night before surgery and the  morning of surgery.  2.  If you choose to wash your hair, wash your hair first as usual with your normal  shampoo.  3.  After you shampoo, rinse your hair and body thoroughly to remove the shampoo.                             4.  Use CHG as you would any other liquid soap.  You can apply chg directly to the skin and wash.  Gently with a scrungie or clean washcloth.  5.  Apply the CHG Soap to your body ONLY FROM THE NECK DOWN.   Do not use on face/ open                           Wound or open sores. Avoid contact  with eyes, ears mouth and genitals (private parts).                       Wash face,  Genitals (private parts) with your normal soap.             6.  Wash thoroughly, paying special attention to the area where your  surgery  will be performed.  7.  Thoroughly rinse your body with warm water from the neck down.  8.  DO NOT shower/wash with your normal soap after using and rinsing off the CHG Soap.            9.  Pat yourself dry with a clean towel.            10.  Wear clean pajamas.            11.  Place clean sheets on your bed the night of your first shower and do not  sleep with pets.  ON THE DAY OF SURGERY : Do not apply any lotions/deodorants the morning of surgery.  Please wear clean clothes to the hospital/surgery center.     FAILURE TO FOLLOW THESE INSTRUCTIONS MAY RESULT IN THE CANCELLATION OF YOUR SURGERY  PATIENT SIGNATURE_________________________________  NURSE SIGNATURE__________________________________  ________________________________________________________________________

## 2023-01-23 ENCOUNTER — Telehealth: Payer: Self-pay | Admitting: Internal Medicine

## 2023-01-23 ENCOUNTER — Encounter (HOSPITAL_COMMUNITY)
Admission: RE | Admit: 2023-01-23 | Discharge: 2023-01-23 | Disposition: A | Discharge status: Home / Self Care | Payer: Medicare Other | Source: Ambulatory Visit | Attending: Surgery | Admitting: Surgery

## 2023-01-23 ENCOUNTER — Other Ambulatory Visit: Payer: Self-pay

## 2023-01-23 ENCOUNTER — Encounter (HOSPITAL_COMMUNITY): Payer: Self-pay

## 2023-01-23 VITALS — BP 150/94 | HR 68 | Temp 97.7°F | Resp 16 | Ht 60.0 in | Wt 108.0 lb

## 2023-01-23 DIAGNOSIS — Z8614 Personal history of Methicillin resistant Staphylococcus aureus infection: Secondary | ICD-10-CM | POA: Insufficient documentation

## 2023-01-23 DIAGNOSIS — Z01812 Encounter for preprocedural laboratory examination: Secondary | ICD-10-CM | POA: Diagnosis present

## 2023-01-23 DIAGNOSIS — I10 Essential (primary) hypertension: Secondary | ICD-10-CM | POA: Diagnosis not present

## 2023-01-23 DIAGNOSIS — Z01818 Encounter for other preprocedural examination: Secondary | ICD-10-CM

## 2023-01-23 HISTORY — DX: Personal history of other diseases of the digestive system: Z87.19

## 2023-01-23 HISTORY — DX: Anemia, unspecified: D64.9

## 2023-01-23 LAB — CBC
HCT: 34.4 % — ABNORMAL LOW (ref 36.0–46.0)
Hemoglobin: 11.4 g/dL — ABNORMAL LOW (ref 12.0–15.0)
MCH: 31.9 pg (ref 26.0–34.0)
MCHC: 33.1 g/dL (ref 30.0–36.0)
MCV: 96.4 fL (ref 80.0–100.0)
Platelets: 263 10*3/uL (ref 150–400)
RBC: 3.57 MIL/uL — ABNORMAL LOW (ref 3.87–5.11)
RDW: 16.4 % — ABNORMAL HIGH (ref 11.5–15.5)
WBC: 10.1 10*3/uL (ref 4.0–10.5)
nRBC: 0 % (ref 0.0–0.2)

## 2023-01-23 LAB — SURGICAL PCR SCREEN
MRSA, PCR: NEGATIVE
Staphylococcus aureus: POSITIVE — AB

## 2023-01-23 LAB — BASIC METABOLIC PANEL
Anion gap: 8 (ref 5–15)
BUN: 30 mg/dL — ABNORMAL HIGH (ref 8–23)
CO2: 22 mmol/L (ref 22–32)
Calcium: 8.8 mg/dL — ABNORMAL LOW (ref 8.9–10.3)
Chloride: 108 mmol/L (ref 98–111)
Creatinine, Ser: 0.96 mg/dL (ref 0.44–1.00)
GFR, Estimated: >60 mL/min (ref >60)
Glucose, Bld: 104 mg/dL — ABNORMAL HIGH (ref 70–99)
Potassium: 4.1 mmol/L (ref 3.5–5.1)
Sodium: 138 mmol/L (ref 135–145)

## 2023-01-23 NOTE — Telephone Encounter (Signed)
[name] I am helping in [name]'s box and this pt is calling to follow up on [facility] appt.    Versions: 1. Jeanine Luz, CMA (Certified Medical Assistant) at 11/21/2022 12:34 PM - Signed   We have sent the following medications to your pharmacy for you to pick up at your convenience:   I will call [facility] to see about your appointment.

## 2023-01-23 NOTE — Telephone Encounter (Signed)
Patient called to follow up on surgical procedure for hiatal hernia, is requesting to speak with a nurse. Please advise.

## 2023-01-24 NOTE — Progress Notes (Signed)
Patient's PCR screen is positive for STAPH. Appropriate notes have been placed on the patient's chart. This note has been routed to Dr. Dossie Der and Sheralyn Boatman  for review. The Patient's surgery is currently scheduled for: 01-31-2023 at [facility]. 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   Rudean Haskell, BSN, CVRN-BC   Pre-Surgical Testing Nurse [facility]  [phone]

## 2023-01-24 NOTE — Telephone Encounter (Signed)
Clarified with patient that she is scheduled for surgery next week.  She wondered about her Budesonide (Entocort) and sedation.  I told her if she was worried about it to skip her morning dose the day of the surgery and then resume after.  Patient agreed.

## 2023-01-25 IMAGING — CR DG CERVICAL SPINE 1V
2 series · 2 of 2 positions shown · non-contrast
Comparison: CT of the thoracic spine dated 05/02/2021.

CLINICAL DATA: History of cervical fracture. Upper cervical x-ray
to clear T1.

EXAM:
DG CERVICAL SPINE - 1 VIEW

[c-spine lat (1 of 2)]
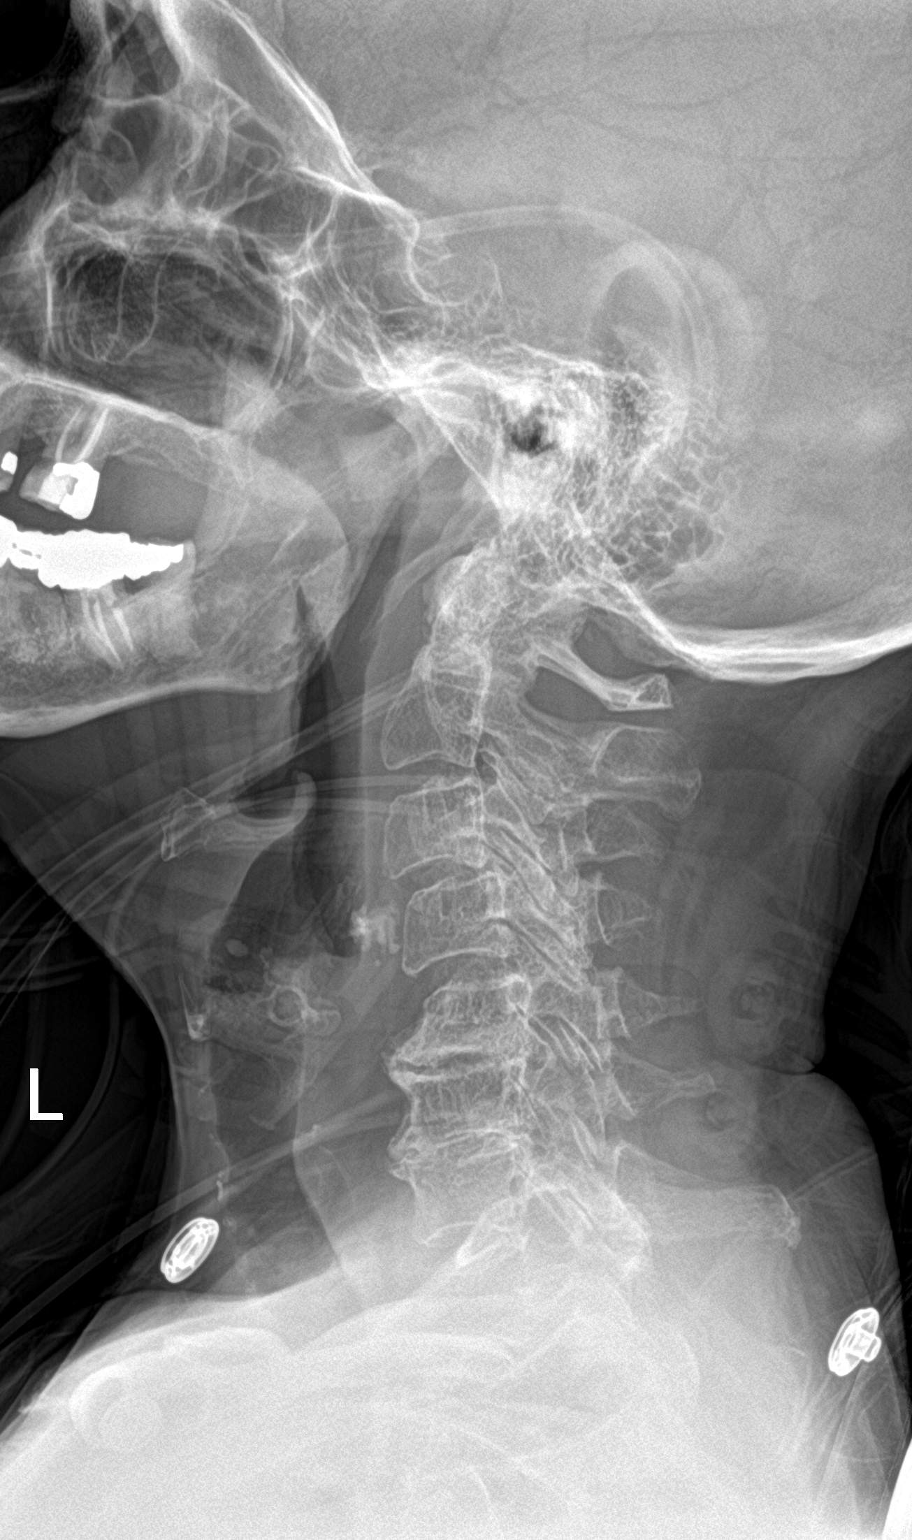

[c-spine lat (2 of 2)]
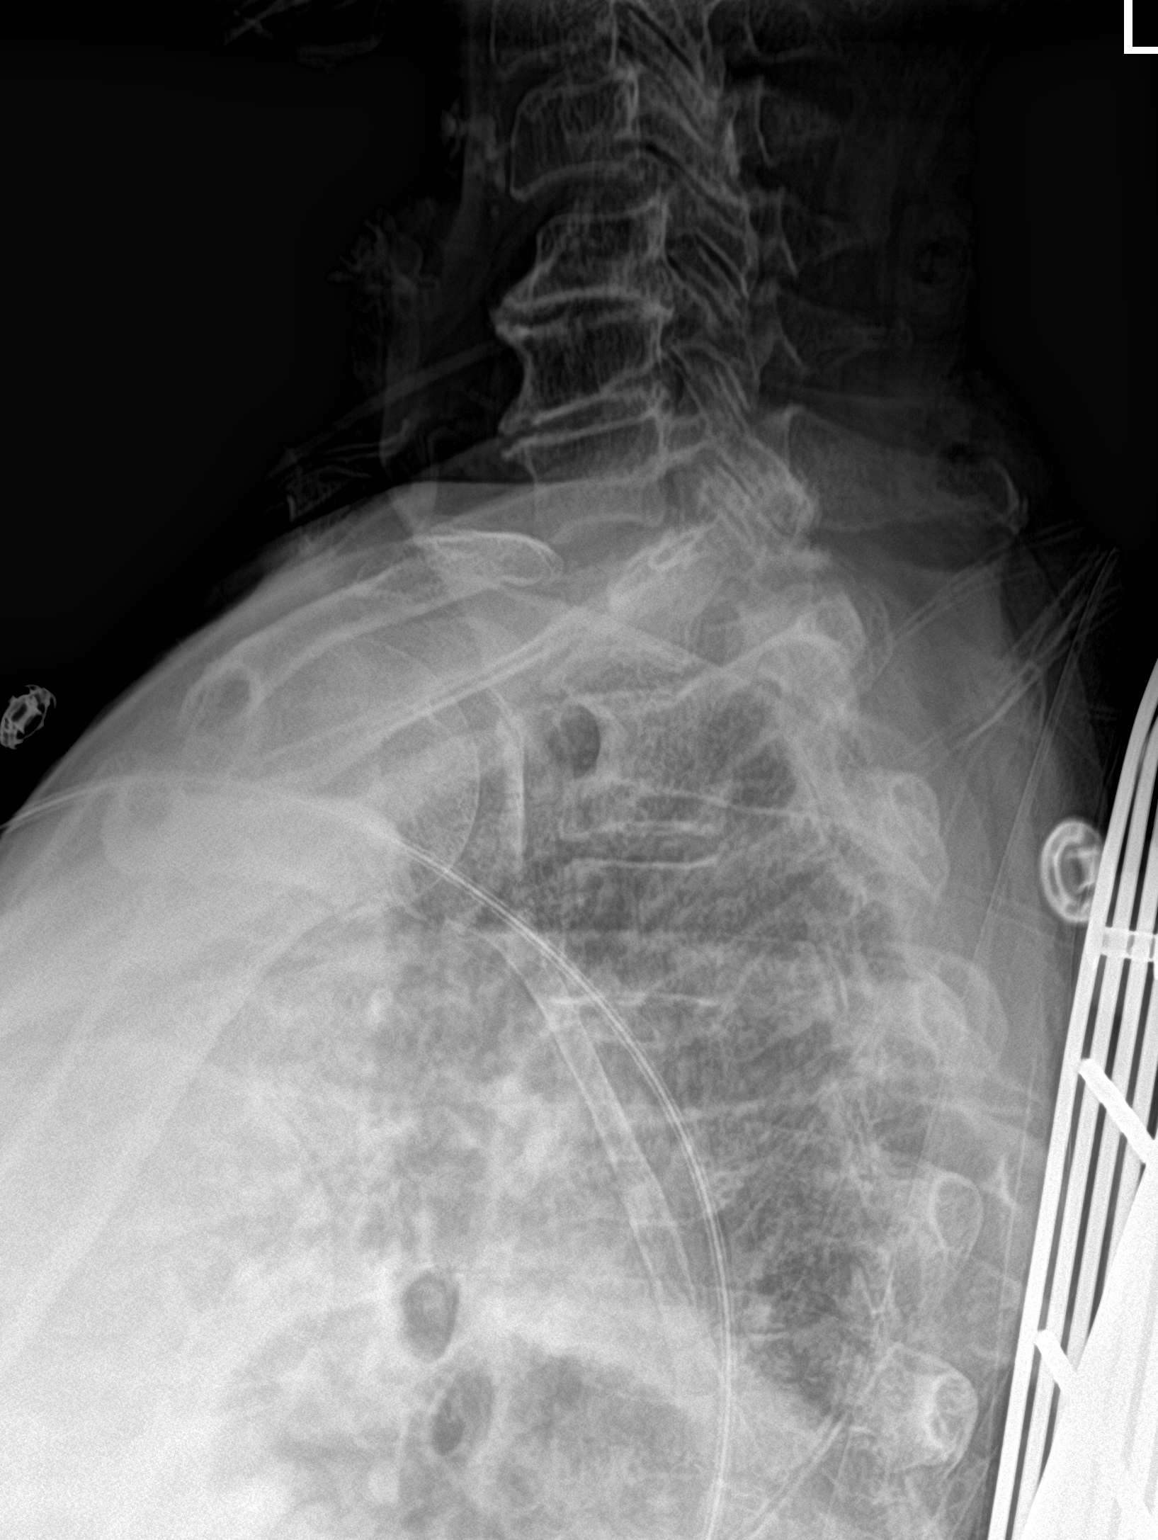

[2 of 2 positions shown; findings below may reference images not displayed]

FINDINGS: The T1 compression fracture deformity is redemonstrated. Vertebral
body height appears stable compared to the CT of the thoracic spine
on 05/02/2021, with approximately 40% loss of vertebral body height.
No retropulsion, perhaps minimal anterolisthesis related to the
underlying T2 vertebral body.

Slight reversal of the normal cervical spine lordosis. Degenerative
spondylosis at the C5-6 and C6-7 levels, at least moderate in degree
with associated disc space narrowing and osseous spurring.
Additional degenerative facet hypertrophy throughout the cervical
spine, most pronounced at the C3 through C6 levels. Associated mild
anterolisthesis of C3 and C4.

No additional vertebral body fracture or evidence of acute vertebral
body subluxation is seen. Prevertebral soft tissues appear normal in
thickness.
IMPRESSION: 1. Compression fracture deformity of the T1 vertebral body, stable
in appearance compared to the thoracic spine CT of 05/02/2021, with
approximately 40% loss of vertebral body height. Alignment of the
vertebral body appears stable. No vertebral body retropulsion.
2. Degenerative spondylosis within the cervical spine, at least
moderate in degree, as detailed above.

## 2023-01-28 ENCOUNTER — Telehealth: Payer: Medicare Other | Admitting: Pulmonary Disease

## 2023-01-28 DIAGNOSIS — J432 Centrilobular emphysema: Secondary | ICD-10-CM | POA: Diagnosis not present

## 2023-01-28 NOTE — Patient Instructions (Addendum)
We will leave breztri samples at the front desk for you   Please cancel visit with Rhunette Croft, NP coming up this Monday  Follow up in 3 months either in person or via video visit

## 2023-01-28 NOTE — Progress Notes (Unsigned)
Virtual Visit via Video Note  I connected with [name] on 01/28/23 at 11:30 AM EST by a video enabled telemedicine application and verified that I am speaking with the correct person using two identifiers.  Location: Patient: home Provider: clinic   I discussed the limitations of evaluation and management by telemedicine and the availability of in person appointments. The patient expressed understanding and agreed to proceed.  History of Present Illness: [name] is a 78 year old woman, former smoker with Crohn's Disease, Rheumatoid Arthritis, GERD, hiatal hernia, scoliosis and hypertension who returns to pulmonary clinic for emphysema.  She was treated for COPD exacerbation at last visit 01/02/23. She reports feeling better after completing steroids and Zpak. She continues to have cough but reports her wheezing and breathing are overall improved.    Observations/Objective: Elderly woman, no acute distress Talking in full sentences   Assessment and Plan: COPD Chronic Cough GERD/Hiatal Hernia  Plan: - Continue breztri inhaler 2 puffs twice daily - albuterol as needed  Follow Up Instructions: Follow up in 3 months   I discussed the assessment and treatment plan with the patient. The patient was provided an opportunity to ask questions and all were answered. The patient agreed with the plan and demonstrated an understanding of the instructions.   The patient was advised to call back or seek an in-person evaluation if the symptoms worsen or if the condition fails to improve as anticipated.  I provided 30 minutes of non-face-to-face time during this encounter.   Martina Sinner, MD

## 2023-01-29 ENCOUNTER — Encounter: Payer: Self-pay | Admitting: Pulmonary Disease

## 2023-01-30 ENCOUNTER — Encounter (HOSPITAL_COMMUNITY): Payer: Self-pay | Admitting: Surgery

## 2023-01-30 NOTE — Anesthesia Preprocedure Evaluation (Addendum)
Anesthesia Evaluation  Patient identified by MRN, date of birth, ID band Patient awake    Reviewed: Allergy & Precautions, NPO status , Patient's Chart, lab work & pertinent test results, reviewed documented beta blocker date and time   History of Anesthesia Complications (+) PONV and history of anesthetic complications  Airway Mallampati: II  TM Distance: >3 FB     Dental no notable dental hx. (+) Teeth Intact, Dental Advisory Given   Pulmonary pneumonia, COPD,  COPD inhaler, former smoker   Pulmonary exam normal breath sounds clear to auscultation       Cardiovascular hypertension, Pt. on medications Normal cardiovascular exam Rhythm:Regular Rate:Normal     Neuro/Psych  PSYCHIATRIC DISORDERS Anxiety     Peripheral neuropathy  Neuromuscular disease    GI/Hepatic Neg liver ROS, hiatal hernia,GERD  Medicated,,Crohn's disease Pancreatic insufficiency   Endo/Other  hyperlipidemia  Renal/GU Renal InsufficiencyRenal disease  negative genitourinary   Musculoskeletal  (+) Arthritis , Osteoarthritis,  Osteopenia    Abdominal   Peds  Hematology  (+) Blood dyscrasia, anemia   Anesthesia Other Findings   Reproductive/Obstetrics HSV                              Anesthesia Physical Anesthesia Plan  ASA: 3  Anesthesia Plan: General   Post-op Pain Management: Dilaudid IV, Precedex and Ofirmev IV (intra-op)*   Induction: Intravenous  PONV Risk Score and Plan: 4 or greater and Treatment may vary due to age or medical condition, Diphenhydramine, TIVA, Ondansetron and Dexamethasone  Airway Management Planned: Oral ETT  Additional Equipment: None  Intra-op Plan:   Post-operative Plan: Extubation in OR  Informed Consent: I have reviewed the patients History and Physical, chart, labs and discussed the procedure including the risks, benefits and alternatives for the proposed anesthesia with  the patient or authorized representative who has indicated his/her understanding and acceptance.     Dental advisory given  Plan Discussed with: CRNA and Anesthesiologist  Anesthesia Plan Comments:         Anesthesia Quick Evaluation

## 2023-01-31 ENCOUNTER — Inpatient Hospital Stay (HOSPITAL_COMMUNITY)
Admission: RE | Admit: 2023-01-31 | Discharge: 2023-02-03 | DRG: 327 | Disposition: A | Discharge status: Home / Self Care | Payer: Medicare Other | Attending: Surgery | Admitting: Surgery

## 2023-01-31 ENCOUNTER — Ambulatory Visit (HOSPITAL_COMMUNITY): Payer: Medicare Other | Admitting: Anesthesiology

## 2023-01-31 ENCOUNTER — Other Ambulatory Visit: Payer: Self-pay

## 2023-01-31 ENCOUNTER — Encounter (HOSPITAL_COMMUNITY): Payer: Self-pay | Admitting: Surgery

## 2023-01-31 ENCOUNTER — Encounter (HOSPITAL_COMMUNITY)
Admission: RE | Disposition: A | Discharge status: Home / Self Care | Payer: Self-pay | Source: Home / Self Care | Attending: Surgery

## 2023-01-31 DIAGNOSIS — K449 Diaphragmatic hernia without obstruction or gangrene: Secondary | ICD-10-CM

## 2023-01-31 DIAGNOSIS — Z881 Allergy status to other antibiotic agents status: Secondary | ICD-10-CM

## 2023-01-31 DIAGNOSIS — Z91040 Latex allergy status: Secondary | ICD-10-CM

## 2023-01-31 DIAGNOSIS — J449 Chronic obstructive pulmonary disease, unspecified: Secondary | ICD-10-CM | POA: Diagnosis present

## 2023-01-31 DIAGNOSIS — Z888 Allergy status to other drugs, medicaments and biological substances status: Secondary | ICD-10-CM

## 2023-01-31 DIAGNOSIS — K5090 Crohn's disease, unspecified, without complications: Secondary | ICD-10-CM | POA: Diagnosis present

## 2023-01-31 DIAGNOSIS — I10 Essential (primary) hypertension: Secondary | ICD-10-CM | POA: Diagnosis present

## 2023-01-31 DIAGNOSIS — Z96653 Presence of artificial knee joint, bilateral: Secondary | ICD-10-CM | POA: Diagnosis present

## 2023-01-31 DIAGNOSIS — Z87891 Personal history of nicotine dependence: Secondary | ICD-10-CM

## 2023-01-31 DIAGNOSIS — E785 Hyperlipidemia, unspecified: Secondary | ICD-10-CM | POA: Diagnosis not present

## 2023-01-31 DIAGNOSIS — Z91048 Other nonmedicinal substance allergy status: Secondary | ICD-10-CM

## 2023-01-31 DIAGNOSIS — J189 Pneumonia, unspecified organism: Secondary | ICD-10-CM

## 2023-01-31 DIAGNOSIS — Z79899 Other long term (current) drug therapy: Secondary | ICD-10-CM

## 2023-01-31 DIAGNOSIS — Z885 Allergy status to narcotic agent status: Secondary | ICD-10-CM

## 2023-01-31 DIAGNOSIS — Z886 Allergy status to analgesic agent status: Secondary | ICD-10-CM

## 2023-01-31 DIAGNOSIS — Z7951 Long term (current) use of inhaled steroids: Secondary | ICD-10-CM

## 2023-01-31 DIAGNOSIS — Z9842 Cataract extraction status, left eye: Secondary | ICD-10-CM

## 2023-01-31 DIAGNOSIS — K219 Gastro-esophageal reflux disease without esophagitis: Secondary | ICD-10-CM | POA: Diagnosis present

## 2023-01-31 DIAGNOSIS — Z9841 Cataract extraction status, right eye: Secondary | ICD-10-CM

## 2023-01-31 HISTORY — PX: XI ROBOTIC ASSISTED HIATAL HERNIA REPAIR: SHX6889

## 2023-01-31 LAB — CBC
HCT: 32.2 % — ABNORMAL LOW (ref 36.0–46.0)
Hemoglobin: 10.8 g/dL — ABNORMAL LOW (ref 12.0–15.0)
MCH: 32 pg (ref 26.0–34.0)
MCHC: 33.5 g/dL (ref 30.0–36.0)
MCV: 95.3 fL (ref 80.0–100.0)
Platelets: 272 10*3/uL (ref 150–400)
RBC: 3.38 MIL/uL — ABNORMAL LOW (ref 3.87–5.11)
RDW: 16.6 % — ABNORMAL HIGH (ref 11.5–15.5)
WBC: 18.9 10*3/uL — ABNORMAL HIGH (ref 4.0–10.5)
nRBC: 0 % (ref 0.0–0.2)

## 2023-01-31 LAB — CREATININE, SERUM
Creatinine, Ser: 0.99 mg/dL (ref 0.44–1.00)
GFR, Estimated: 59 mL/min — ABNORMAL LOW (ref >60)

## 2023-01-31 SURGERY — REPAIR, HERNIA, HIATAL, ROBOT-ASSISTED
Anesthesia: General

## 2023-01-31 MED ORDER — DOCUSATE SODIUM 100 MG PO CAPS
100.0000 mg | ORAL_CAPSULE | Freq: Two times a day (BID) | ORAL | Status: DC
  Administered 2023-01-31 – 2023-02-03 (×6): 100 mg via ORAL
  Filled 2023-01-31 (×6): qty 1

## 2023-01-31 MED ORDER — FENTANYL CITRATE (PF) 100 MCG/2ML IJ SOLN
INTRAMUSCULAR | Status: AC
  Filled 2023-01-31: qty 2

## 2023-01-31 MED ORDER — LACTATED RINGERS IV SOLN: INTRAVENOUS | Status: DC

## 2023-01-31 MED ORDER — ONDANSETRON HCL 4 MG/2ML IJ SOLN
4.0000 mg | Freq: Four times a day (QID) | INTRAMUSCULAR | Status: DC | PRN
  Administered 2023-02-01: 4 mg via INTRAVENOUS
  Filled 2023-01-31: qty 2

## 2023-01-31 MED ORDER — LACTATED RINGERS IV SOLN
INTRAVENOUS | Status: DC
Start: 2023-01-31 — End: 2023-02-01

## 2023-01-31 MED ORDER — FLUTICASONE FUROATE-VILANTEROL 200-25 MCG/ACT IN AEPB
1.0000 | INHALATION_SPRAY | Freq: Every day | RESPIRATORY_TRACT | Status: DC
  Administered 2023-02-01 – 2023-02-02 (×2): 1 via RESPIRATORY_TRACT
  Filled 2023-01-31: qty 28

## 2023-01-31 MED ORDER — 0.9 % SODIUM CHLORIDE (POUR BTL) OPTIME
TOPICAL | Status: DC | PRN
  Administered 2023-01-31: 1000 mL

## 2023-01-31 MED ORDER — DULOXETINE HCL 60 MG PO CPEP
60.0000 mg | ORAL_CAPSULE | Freq: Two times a day (BID) | ORAL | Status: DC
  Administered 2023-01-31 – 2023-02-03 (×6): 60 mg via ORAL
  Filled 2023-01-31 (×6): qty 1

## 2023-01-31 MED ORDER — LABETALOL HCL 5 MG/ML IV SOLN
INTRAVENOUS | Status: AC
Start: 2023-01-31 — End: ?
  Filled 2023-01-31: qty 4

## 2023-01-31 MED ORDER — ROCURONIUM BROMIDE 10 MG/ML (PF) SYRINGE
PREFILLED_SYRINGE | INTRAVENOUS | Status: AC
Start: 2023-01-31 — End: ?
  Filled 2023-01-31: qty 10

## 2023-01-31 MED ORDER — MOMETASONE FURO-FORMOTEROL FUM 200-5 MCG/ACT IN AERO: 2.0000 | INHALATION_SPRAY | Freq: Two times a day (BID) | RESPIRATORY_TRACT | Status: DC

## 2023-01-31 MED ORDER — PROPOFOL 10 MG/ML IV BOLUS
INTRAVENOUS | Status: AC
Start: 2023-01-31 — End: ?
  Filled 2023-01-31: qty 20

## 2023-01-31 MED ORDER — ACETAMINOPHEN 325 MG PO TABS
650.0000 mg | ORAL_TABLET | Freq: Four times a day (QID) | ORAL | Status: DC
  Administered 2023-01-31 – 2023-02-03 (×11): 650 mg via ORAL
  Filled 2023-01-31 (×12): qty 2

## 2023-01-31 MED ORDER — GABAPENTIN 300 MG PO CAPS
300.0000 mg | ORAL_CAPSULE | Freq: Three times a day (TID) | ORAL | Status: DC
  Administered 2023-01-31 – 2023-02-01 (×5): 300 mg via ORAL
  Filled 2023-01-31 (×9): qty 1

## 2023-01-31 MED ORDER — PROPOFOL 10 MG/ML IV BOLUS
INTRAVENOUS | Status: DC | PRN
  Administered 2023-01-31: 120 mg via INTRAVENOUS

## 2023-01-31 MED ORDER — HYDROMORPHONE HCL 1 MG/ML IJ SOLN
0.2500 mg | INTRAMUSCULAR | Status: DC | PRN
  Administered 2023-01-31 (×2): 0.25 mg via INTRAVENOUS

## 2023-01-31 MED ORDER — FUROSEMIDE 20 MG PO TABS: 20.0000 mg | ORAL_TABLET | Freq: Every day | ORAL | Status: DC | PRN

## 2023-01-31 MED ORDER — MESALAMINE 1.2 G PO TBEC
4.8000 g | DELAYED_RELEASE_TABLET | Freq: Every day | ORAL | Status: DC
  Administered 2023-02-01 – 2023-02-03 (×2): 4.8 g via ORAL
  Filled 2023-01-31 (×3): qty 4

## 2023-01-31 MED ORDER — DEXAMETHASONE SODIUM PHOSPHATE 10 MG/ML IJ SOLN
INTRAMUSCULAR | Status: AC
  Filled 2023-01-31: qty 1

## 2023-01-31 MED ORDER — FENTANYL CITRATE (PF) 100 MCG/2ML IJ SOLN
INTRAMUSCULAR | Status: DC | PRN
  Administered 2023-01-31: 50 ug via INTRAVENOUS
  Administered 2023-01-31 (×2): 25 ug via INTRAVENOUS
  Administered 2023-01-31 (×2): 50 ug via INTRAVENOUS

## 2023-01-31 MED ORDER — ONDANSETRON HCL 4 MG/2ML IJ SOLN
4.0000 mg | Freq: Once | INTRAMUSCULAR | Status: DC | PRN
Start: 2023-01-31 — End: 2023-01-31

## 2023-01-31 MED ORDER — UMECLIDINIUM BROMIDE 62.5 MCG/ACT IN AEPB
1.0000 | INHALATION_SPRAY | Freq: Every day | RESPIRATORY_TRACT | Status: DC
  Administered 2023-02-01 – 2023-02-02 (×2): 1 via RESPIRATORY_TRACT
  Filled 2023-01-31: qty 7

## 2023-01-31 MED ORDER — OXYCODONE HCL 5 MG PO TABS
10.0000 mg | ORAL_TABLET | ORAL | Status: DC | PRN
  Administered 2023-01-31 – 2023-02-01 (×4): 10 mg via ORAL
  Filled 2023-01-31 (×4): qty 2

## 2023-01-31 MED ORDER — SIMETHICONE 80 MG PO CHEW
80.0000 mg | CHEWABLE_TABLET | Freq: Four times a day (QID) | ORAL | Status: DC | PRN
  Administered 2023-02-01: 80 mg via ORAL
  Filled 2023-01-31: qty 1

## 2023-01-31 MED ORDER — AMISULPRIDE (ANTIEMETIC) 5 MG/2ML IV SOLN: 10.0000 mg | Freq: Once | INTRAVENOUS | Status: DC | PRN

## 2023-01-31 MED ORDER — PHENYLEPHRINE 80 MCG/ML (10ML) SYRINGE FOR IV PUSH (FOR BLOOD PRESSURE SUPPORT)
PREFILLED_SYRINGE | INTRAVENOUS | Status: DC | PRN
  Administered 2023-01-31: 80 ug via INTRAVENOUS

## 2023-01-31 MED ORDER — LABETALOL HCL 5 MG/ML IV SOLN
INTRAVENOUS | Status: DC | PRN
  Administered 2023-01-31 (×3): 2.5 mg via INTRAVENOUS
  Administered 2023-01-31: 5 mg via INTRAVENOUS
  Administered 2023-01-31: 2.5 mg via INTRAVENOUS

## 2023-01-31 MED ORDER — GABAPENTIN 300 MG PO CAPS
300.0000 mg | ORAL_CAPSULE | ORAL | Status: AC
  Administered 2023-01-31: 300 mg via ORAL
  Filled 2023-01-31: qty 1

## 2023-01-31 MED ORDER — COLESTIPOL HCL 1 G PO TABS
2.0000 g | ORAL_TABLET | Freq: Two times a day (BID) | ORAL | Status: DC
  Administered 2023-01-31 – 2023-02-03 (×6): 2 g via ORAL
  Filled 2023-01-31 (×6): qty 2

## 2023-01-31 MED ORDER — ONDANSETRON HCL 4 MG/2ML IJ SOLN
INTRAMUSCULAR | Status: AC
  Filled 2023-01-31: qty 2

## 2023-01-31 MED ORDER — LOSARTAN POTASSIUM 50 MG PO TABS
100.0000 mg | ORAL_TABLET | Freq: Every day | ORAL | Status: DC
  Administered 2023-01-31 – 2023-02-03 (×4): 100 mg via ORAL
  Filled 2023-01-31 (×4): qty 2

## 2023-01-31 MED ORDER — PHENYLEPHRINE 80 MCG/ML (10ML) SYRINGE FOR IV PUSH (FOR BLOOD PRESSURE SUPPORT)
PREFILLED_SYRINGE | INTRAVENOUS | Status: AC
  Filled 2023-01-31: qty 10

## 2023-01-31 MED ORDER — METHOCARBAMOL 1000 MG/10ML IJ SOLN
500.0000 mg | Freq: Four times a day (QID) | INTRAMUSCULAR | Status: DC | PRN
  Administered 2023-02-01 – 2023-02-02 (×2): 500 mg via INTRAVENOUS
  Filled 2023-01-31 (×2): qty 10

## 2023-01-31 MED ORDER — CEFAZOLIN SODIUM-DEXTROSE 2-4 GM/100ML-% IV SOLN
2.0000 g | INTRAVENOUS | Status: AC
  Administered 2023-01-31: 2 g via INTRAVENOUS
  Filled 2023-01-31: qty 100

## 2023-01-31 MED ORDER — VALACYCLOVIR HCL 500 MG PO TABS: 1000.0000 mg | ORAL_TABLET | Freq: Every day | ORAL | Status: DC | PRN

## 2023-01-31 MED ORDER — HYDROMORPHONE HCL 1 MG/ML IJ SOLN
INTRAMUSCULAR | Status: AC
  Administered 2023-01-31: 0.25 mg via INTRAVENOUS
  Filled 2023-01-31: qty 1

## 2023-01-31 MED ORDER — BUPIVACAINE-EPINEPHRINE 0.25% -1:200000 IJ SOLN
INTRAMUSCULAR | Status: AC
  Filled 2023-01-31: qty 1

## 2023-01-31 MED ORDER — ROCURONIUM BROMIDE 10 MG/ML (PF) SYRINGE
PREFILLED_SYRINGE | INTRAVENOUS | Status: DC | PRN
  Administered 2023-01-31: 20 mg via INTRAVENOUS
  Administered 2023-01-31: 80 mg via INTRAVENOUS
  Administered 2023-01-31: 20 mg via INTRAVENOUS

## 2023-01-31 MED ORDER — LORATADINE 10 MG PO TABS
10.0000 mg | ORAL_TABLET | Freq: Every day | ORAL | Status: DC
  Administered 2023-02-01 – 2023-02-03 (×3): 10 mg via ORAL
  Filled 2023-01-31 (×3): qty 1

## 2023-01-31 MED ORDER — EPHEDRINE 5 MG/ML INJ
INTRAVENOUS | Status: AC
  Filled 2023-01-31: qty 5

## 2023-01-31 MED ORDER — BUDESON-GLYCOPYRROL-FORMOTEROL 160-9-4.8 MCG/ACT IN AERO: 2.0000 | INHALATION_SPRAY | Freq: Two times a day (BID) | RESPIRATORY_TRACT | Status: DC

## 2023-01-31 MED ORDER — DICYCLOMINE HCL 20 MG PO TABS: 20.0000 mg | ORAL_TABLET | Freq: Four times a day (QID) | ORAL | Status: DC | PRN

## 2023-01-31 MED ORDER — DEXAMETHASONE SODIUM PHOSPHATE 10 MG/ML IJ SOLN
INTRAMUSCULAR | Status: DC | PRN
  Administered 2023-01-31: 4 mg via INTRAVENOUS

## 2023-01-31 MED ORDER — CHLORHEXIDINE GLUCONATE CLOTH 2 % EX PADS: 6.0000 | MEDICATED_PAD | Freq: Once | CUTANEOUS | Status: DC

## 2023-01-31 MED ORDER — ORAL CARE MOUTH RINSE: 15.0000 mL | Freq: Once | OROMUCOSAL | Status: AC

## 2023-01-31 MED ORDER — HYDROMORPHONE HCL 1 MG/ML IJ SOLN
1.0000 mg | INTRAMUSCULAR | Status: DC | PRN
  Administered 2023-01-31 – 2023-02-01 (×2): 1 mg via INTRAVENOUS
  Filled 2023-01-31 (×2): qty 1

## 2023-01-31 MED ORDER — ENOXAPARIN SODIUM 40 MG/0.4ML IJ SOSY
40.0000 mg | PREFILLED_SYRINGE | INTRAMUSCULAR | Status: DC
  Filled 2023-01-31: qty 0.4

## 2023-01-31 MED ORDER — PANTOPRAZOLE SODIUM 40 MG PO TBEC
40.0000 mg | DELAYED_RELEASE_TABLET | Freq: Every day | ORAL | Status: DC
  Administered 2023-02-01 – 2023-02-03 (×3): 40 mg via ORAL
  Filled 2023-01-31 (×3): qty 1

## 2023-01-31 MED ORDER — BUPIVACAINE-EPINEPHRINE 0.25% -1:200000 IJ SOLN
INTRAMUSCULAR | Status: DC | PRN
  Administered 2023-01-31: 30 mL

## 2023-01-31 MED ORDER — ACETAMINOPHEN 500 MG PO TABS
1000.0000 mg | ORAL_TABLET | ORAL | Status: AC
  Administered 2023-01-31: 1000 mg via ORAL
  Filled 2023-01-31: qty 2

## 2023-01-31 MED ORDER — HEPARIN SODIUM (PORCINE) 5000 UNIT/ML IJ SOLN
5000.0000 [IU] | Freq: Once | INTRAMUSCULAR | Status: AC
  Administered 2023-01-31: 5000 [IU] via SUBCUTANEOUS
  Filled 2023-01-31: qty 1

## 2023-01-31 MED ORDER — CHLORHEXIDINE GLUCONATE 0.12 % MT SOLN
15.0000 mL | Freq: Once | OROMUCOSAL | Status: AC
  Administered 2023-01-31: 15 mL via OROMUCOSAL

## 2023-01-31 MED ORDER — BUPIVACAINE LIPOSOME 1.3 % IJ SUSP: 20.0000 mL | Freq: Once | INTRAMUSCULAR | Status: AC

## 2023-01-31 MED ORDER — ONDANSETRON HCL 4 MG/2ML IJ SOLN
INTRAMUSCULAR | Status: DC | PRN
  Administered 2023-01-31: 4 mg via INTRAVENOUS

## 2023-01-31 MED ORDER — LIDOCAINE HCL (PF) 2 % IJ SOLN
INTRAMUSCULAR | Status: AC
  Filled 2023-01-31: qty 5

## 2023-01-31 MED ORDER — DIPHENOXYLATE-ATROPINE 2.5-0.025 MG PO TABS
2.0000 | ORAL_TABLET | Freq: Three times a day (TID) | ORAL | Status: DC
  Administered 2023-01-31 – 2023-02-02 (×4): 2 via ORAL
  Filled 2023-01-31 (×6): qty 2

## 2023-01-31 MED ORDER — PROCHLORPERAZINE EDISYLATE 10 MG/2ML IJ SOLN: 10.0000 mg | INTRAMUSCULAR | Status: DC | PRN

## 2023-01-31 MED ORDER — SUGAMMADEX SODIUM 200 MG/2ML IV SOLN
INTRAVENOUS | Status: DC | PRN
  Administered 2023-01-31: 100 mg via INTRAVENOUS

## 2023-01-31 MED ORDER — ROCURONIUM BROMIDE 10 MG/ML (PF) SYRINGE
PREFILLED_SYRINGE | INTRAVENOUS | Status: AC
  Filled 2023-01-31: qty 10

## 2023-01-31 MED ORDER — FLUTICASONE PROPIONATE 50 MCG/ACT NA SUSP
2.0000 | Freq: Every day | NASAL | Status: DC
  Administered 2023-02-01 – 2023-02-03 (×3): 2 via NASAL
  Filled 2023-01-31: qty 16

## 2023-01-31 MED ORDER — BUPIVACAINE LIPOSOME 1.3 % IJ SUSP
INTRAMUSCULAR | Status: DC | PRN
  Administered 2023-01-31: 20 mL

## 2023-01-31 MED ORDER — ALBUTEROL SULFATE HFA 108 (90 BASE) MCG/ACT IN AERS: 2.0000 | INHALATION_SPRAY | Freq: Four times a day (QID) | RESPIRATORY_TRACT | Status: DC | PRN

## 2023-01-31 MED ORDER — OXYCODONE HCL 5 MG PO TABS
5.0000 mg | ORAL_TABLET | ORAL | Status: DC | PRN
  Administered 2023-02-01 – 2023-02-02 (×2): 5 mg via ORAL
  Filled 2023-01-31 (×3): qty 1

## 2023-01-31 MED ORDER — BUPIVACAINE LIPOSOME 1.3 % IJ SUSP
INTRAMUSCULAR | Status: AC
  Filled 2023-01-31: qty 20

## 2023-01-31 MED ORDER — ACETAMINOPHEN 325 MG PO TABS: 650.0000 mg | ORAL_TABLET | Freq: Four times a day (QID) | ORAL | Status: DC

## 2023-01-31 MED ORDER — EPHEDRINE SULFATE (PRESSORS) 50 MG/ML IJ SOLN
INTRAMUSCULAR | Status: DC | PRN
  Administered 2023-01-31 (×2): 10 mg via INTRAVENOUS

## 2023-01-31 MED ORDER — LIDOCAINE 2% (20 MG/ML) 5 ML SYRINGE
INTRAMUSCULAR | Status: DC | PRN
  Administered 2023-01-31: 30 mg via INTRAVENOUS

## 2023-01-31 MED ORDER — OXYCODONE-ACETAMINOPHEN 5-325 MG PO TABS: 1.0000 | ORAL_TABLET | ORAL | 0 refills | Status: DC | PRN

## 2023-01-31 MED ORDER — ONDANSETRON HCL 4 MG PO TABS: 4.0000 mg | ORAL_TABLET | Freq: Three times a day (TID) | ORAL | Status: DC | PRN

## 2023-01-31 SURGICAL SUPPLY — 61 items
ANTIFOG SOL W/FOAM PAD STRL (MISCELLANEOUS) ×1
APPLIER CLIP 5 13 M/L LIGAMAX5 (MISCELLANEOUS)
APPLIER CLIP ROT 10 11.4 M/L (STAPLE)
BAG COUNTER SPONGE SURGICOUNT (BAG) ×1 IMPLANT
BLADE SURG SZ11 CARB STEEL (BLADE) ×1 IMPLANT
CHLORAPREP W/TINT 26 (MISCELLANEOUS) ×1 IMPLANT
CLIP APPLIE 5 13 M/L LIGAMAX5 (MISCELLANEOUS) IMPLANT
CLIP APPLIE ROT 10 11.4 M/L (STAPLE) IMPLANT
COVER SURGICAL LIGHT HANDLE (MISCELLANEOUS) ×1 IMPLANT
COVER TIP SHEARS 8 DVNC (MISCELLANEOUS) IMPLANT
DERMABOND ADVANCED .7 DNX12 (GAUZE/BANDAGES/DRESSINGS) ×1 IMPLANT
DRAIN PENROSE 0.5X18 (DRAIN) ×1 IMPLANT
DRAPE ARM DVNC X/XI (DISPOSABLE) ×4 IMPLANT
DRAPE COLUMN DVNC XI (DISPOSABLE) ×1 IMPLANT
DRIVER NDL LRG 8 DVNC XI (INSTRUMENTS) ×1 IMPLANT
DRIVER NDL MEGA SUTCUT DVNCXI (INSTRUMENTS) ×1 IMPLANT
DRIVER NDLE LRG 8 DVNC XI (INSTRUMENTS) ×1
DRIVER NDLE MEGA SUTCUT DVNCXI (INSTRUMENTS) ×1
ELECT REM PT RETURN 15FT ADLT (MISCELLANEOUS) ×1 IMPLANT
ENDOLOOP SUT PDS II 0 18 (SUTURE) IMPLANT
GAUZE 4X4 16PLY ~~LOC~~+RFID DBL (SPONGE) ×1 IMPLANT
GLOVE BIO SURGEON STRL SZ7.5 (GLOVE) ×3 IMPLANT
GLOVE INDICATOR 8.0 STRL GRN (GLOVE) ×3 IMPLANT
GOWN STRL REUS W/ TWL XL LVL3 (GOWN DISPOSABLE) IMPLANT
GRASPER SUT TROCAR 14GX15 (MISCELLANEOUS) ×1 IMPLANT
GRASPER TIP-UP FEN DVNC XI (INSTRUMENTS) ×1 IMPLANT
IRRIG SUCT STRYKERFLOW 2 WTIP (MISCELLANEOUS)
IRRIGATION SUCT STRKRFLW 2 WTP (MISCELLANEOUS) IMPLANT
KIT BASIN OR (CUSTOM PROCEDURE TRAY) ×1 IMPLANT
KIT TURNOVER KIT A (KITS) IMPLANT
LUBRICANT JELLY K Y 4OZ (MISCELLANEOUS) IMPLANT
MARKER SKIN DUAL TIP RULER LAB (MISCELLANEOUS) ×1 IMPLANT
MESH BIO-A 7X10 SYN MAT (Mesh General) IMPLANT
NDL HYPO 22X1.5 SAFETY MO (MISCELLANEOUS) ×1 IMPLANT
NEEDLE HYPO 22X1.5 SAFETY MO (MISCELLANEOUS) ×1
PACK CARDIOVASCULAR III (CUSTOM PROCEDURE TRAY) ×1 IMPLANT
PAD POSITIONING PINK XL (MISCELLANEOUS) ×1 IMPLANT
RETRACTOR GRSP SML 8 DVNC XI (INSTRUMENTS) ×1 IMPLANT
SCISSORS LAP 5X35 DISP (ENDOMECHANICALS) IMPLANT
SCISSORS MNPLR CVD DVNC XI (INSTRUMENTS) ×1 IMPLANT
SEAL UNIV 5-12 XI (MISCELLANEOUS) ×3 IMPLANT
SEALER VESSEL EXT DVNC XI (MISCELLANEOUS) ×1 IMPLANT
SET TUBE SMOKE EVAC HIGH FLOW (TUBING) ×1 IMPLANT
SOL ELECTROSURG ANTI STICK (MISCELLANEOUS) ×1
SOLUTION ANTFG W/FOAM PAD STRL (MISCELLANEOUS) ×1 IMPLANT
SOLUTION ELECTROSURG ANTI STCK (MISCELLANEOUS) ×1 IMPLANT
SPIKE FLUID TRANSFER (MISCELLANEOUS) ×1 IMPLANT
SUT ETHIBOND 0 36 GRN (SUTURE) ×2 IMPLANT
SUT MNCRL AB 4-0 PS2 18 (SUTURE) ×1 IMPLANT
SUT SILK 0 SH 30 (SUTURE) IMPLANT
SUT SILK 2 0 SH (SUTURE) IMPLANT
SUT VIC AB 2-0 SH 27X BRD (SUTURE) IMPLANT
SUT VICRYL 0 UR6 27IN ABS (SUTURE) IMPLANT
SYR 20ML LL LF (SYRINGE) ×1 IMPLANT
TIP INNERVISION DETACH 40FR (MISCELLANEOUS) IMPLANT
TIP INNERVISION DETACH 50FR (MISCELLANEOUS) IMPLANT
TIP INNERVISION DETACH 56FR (MISCELLANEOUS) IMPLANT
TOWEL OR 17X26 10 PK STRL BLUE (TOWEL DISPOSABLE) ×1 IMPLANT
TRAY FOLEY MTR SLVR 16FR STAT (SET/KITS/TRAYS/PACK) IMPLANT
TROCAR ADV FIXATION 5X100MM (TROCAR) IMPLANT
TROCAR BALLN 12MMX100 BLUNT (TROCAR) IMPLANT

## 2023-01-31 NOTE — Op Note (Signed)
Patient: [name] (1945-08-04, [id number])  Date of Surgery: 01/31/2023  Preoperative Diagnosis: HIATAL HERNIA   Postoperative Diagnosis: HIATAL HERNIA   Surgical Procedure: XI ROBOTIC ASSISTED HIATAL HERNIA REPAIR WITH MESH  Operative Team Members:  Surgeons and Role:    * Kimyatta Lecy, Hyman Hopes, MD - Primary   Anesthesiologist: Mal Amabile, MD CRNA: Nelle Don, CRNA   Anesthesia: General   Fluids:  Total I/O In: 1400 [I.V.:1300; IV Piggyback:100] Out: 210 [Urine:200; Blood:10]  Complications: None  Drains:  none   Specimen: None   Disposition:  PACU - hemodynamically stable.  Plan of Care: Admit for overnight observation  Indications for Procedure:  [name] is an 78 y.o. female a massive hiatal hernia and symptoms concerning for intermittent gastric volvulus. She has been to the emergency room multiple times with the symptoms. I recommend robotic hiatal hernia repair with possible gastrostomy tube placement. We discussed the procedure itself as well as its risk, benefits, and alternatives. After full discussion all questions answered patient granted some proceed. We will proceed as scheduled.    Findings: Massive hiatal hernia  Description of Procedure:   The patient was positioned supine, padded and secured to the bed.  The abdomen was widely prepped and draped.  A time out procedure was performed.   The abdomen was entered at the umbilicus through a small umbilical defect.  An 8mm trocar was placed in this position.  Three additional were placed across the abdomen.  A Nathanson liver retractor was placed at the epigastrium and utilized to retract the left lobe of the liver anteriorly.  It was held in position with a laparoscopic holding device clamped to the side of the bed.  The robot was docked in the standard fashion and the operation begun from the robotic console.  I used fenestrated bipolar, tip up grasper, prograsp, vessel sealer and  megasuture cut needle driver during the case.   Beginning at the twelve o'clock position on the crus, the hernia sac was grasped and reduced form the mediastinum.  The hernia sac was divided to enter the plane between the sac and the mediastinum.  The hernia sac was divided towards the left and right with continued traction on the hernia sac to reduce it.  A mediastinal dissection was performed to further reduce the hernia sac which facilitated reduction of the stomach as well.  The hernia sac was disconnected from the right and left crura and subsequently, the hernia sac was dissected from the stomach and esophagus, and excised.  The short gastric vessels were divided from the inferior pole of the spleen, superiorly, to completely disconnect the stomach from the spleen and the left hemidiaphragm.  All posterior gastric attachments to the lesser sac and retroperitoneum were divided.  The left crus was further delineated.  A retroesophageal window was created and a 1/4" Penrose drain was used to encircle the esophagus for retraction.    A high, circumferential mediastinal dissection was performed in an effort to mobilize the esophagus and provide for adequate intraabdominal esophageal length.  The mediastinal dissection was performed bluntly, with little to no thermal energy.  The anterior and posterior vagus nerves were both identified and preserved.  The crural defect was reapproximated with multiple, interrupted, 0 Ethibond sutures.  The crural pillars came together well without tearing of the adjacent diaphragmatic tissue.    A BIO-A mesh was placed to reinforce the repair.  It was sutured on either side of the esophagus and to  the crural repair using ethibond suture.  Only 2 cm or so of esophagus was able to be mobilized into the abdomen due to the massive nature of the hiatal hernia.  I did not feel I had enough esophagus to perform a wrap.  The main issue prior to surgery was gastric volvulus, so I  decided not to perform a fundoplication.    The anterior wall of the stomach was sutured to the abdominal wall in the location for a possible feeding tube using two Ethibond sutures.  This will make it easier to place a g-tube postoperatively if needed and will also provide additional downward traction to prevent acute hernia recurrence.    The operative field was inspected for hemostasis.  The Penrose drain was removed.  The robot was undocked and the laparoscope was inserted into the peritoneal cavity to visualize the removal of the liver retractor.    A transversus abdominis plane (TAP) block was performed bilaterally.  The anesthetic was first injected into the plane between the transversus abdominis and internal abdominal oblique muscles on the left. The TAP was repeated on the contralateral side.   The umbilical trocar site was closed using 0-vicryl as there was a small hernia defect there at the beginning of the case.  The peritoneal cavity was desufflated and the trocars removed.  The incisions were closed with 4-0 Monocryl subcuticular sutures and skin glue.     Ivar Drape, MD General, Bariatric, & Minimally Invasive Surgery [facility], [place]

## 2023-01-31 NOTE — H&P (Signed)
Admitting Physician: Hyman Hopes Pinkey Mcjunkin  Service: General surgery  CC: Hiatal hernia  Subjective   HPI: [name] is an 78 y.o. 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   Whooping cough 2015   Whooping cough with pneumonia    2017    Past Surgical History:  Procedure Laterality Date   BIOPSY  11/05/2022   Procedure: BIOPSY;  Surgeon: Beverley Fiedler, MD;  Location: [facility] END[facility]COPY;  Service: Gastroenterology;;   CATARACT EXTRACTION, BILATERAL  2018   with IOC implant    COLONOSCOPY     DEBRIDEMENT AND CLOSURE WOUND Right 10/01/2018   Procedure: Excision of right knee wound with placement of ACell and Pravena;  Surgeon: Peggye Form, DO;  Location: [facility];  Service: Plastics;  Laterality: Right;  30 min   ESOPHAGOGASTRODUODENOSCOPY (EGD) WITH PROPOFOL N/A 11/05/2022   Procedure: ESOPHAGOGASTRODUODENOSCOPY (EGD) WITH PROPOFOL;  Surgeon: Beverley Fiedler, MD;  Location: [facility]COPY;  Service: Gastroenterology;  Laterality: N/A;   knee fracture  Right    NASAL FRACTURE SURGERY     needed b/c she was getting frequent sinus infections    TONSILLECTOMY     TOTAL KNEE ARTHROPLASTY Right 07/28/2018   Procedure: TOTAL KNEE ARTHROPLASTY;  Surgeon: Durene Romans, MD;  Location: [facility] ORS;  Service: Orthopedics;  Laterality: Right;   TOTAL KNEE ARTHROPLASTY Left  03/02/2019   Procedure: TOTAL KNEE ARTHROPLASTY;  Surgeon: Durene Romans, MD;  Location: [facility];  Service: Orthopedics;  Laterality: Left;  70 mins   UPPER GASTROINTESTINAL ENDOSCOPY      Family History  Problem Relation Age of Onset   Diabetes Father    Parkinson's disease Father    Colon cancer Other    Asthma Neg Hx    Esophageal cancer Neg Hx    Pancreatic cancer Neg Hx    Liver disease Neg Hx    Stomach cancer Neg Hx     Social:  reports that she quit smoking about 35 years ago. Her smoking use included cigarettes. She started smoking about 65 years ago. She has a 30 pack-year smoking history. She has never used smokeless tobacco. She reports that she does not currently use alcohol. She reports that she does not use drugs.  Allergies:  Allergies  Allergen Reactions   Adhesive [Tape]     Redness and skin peeling   Aspirin     Intestinal Bleeding   Ciprofloxacin Nausea And Vomiting   Codeine Nausea And Vomiting   Demerol [Meperidine Hcl] Nausea And Vomiting   Fentanyl Nausea And Vomiting    Makes me very sick   Lactose Intolerance (Gi) Other (See Comments)    Pt has Crohn's    Latex  Redness and skin peeling   Other Itching    Nuts-itching in throat  Seeds-stomach issues with chrons  Pt has emphysema    Prednisone Other (See Comments)    Upset stomach   Septra [Sulfamethoxazole-Trimethoprim] Nausea Only    Medications: Current Outpatient Medications  Medication Instructions   albuterol (PROVENTIL) 2.5 mg, Nebulization, Every 6 hours PRN   albuterol (VENTOLIN HFA) 108 (90 Base) MCG/ACT inhaler 2 puffs, Inhalation, Every 6 hours PRN   azithromycin (ZITHROMAX) 250 MG tablet Take as directed   bisacodyl (DULCOLAX) 5 mg, Oral, Daily PRN   Budeson-Glycopyrrol-Formoterol (BREZTRI AEROSPHERE) 160-9-4.8 MCG/ACT AERO 2 puffs, Inhalation, 2 times daily   budesonide (ENTOCORT EC) 9 mg, Oral, Daily   budesonide-formoterol (SYMBICORT) 160-4.5 MCG/ACT inhaler 2 puffs,  Inhalation, 2 times daily   Calcium Citrate-Vitamin D (CALCIUM + D PO) 1 tablet, 2 times daily   cetirizine (ZYRTEC) 10 mg, Oral, Daily   colestipol (COLESTID) 2 g, Oral, 2 times daily   dicyclomine (BENTYL) 20 mg, Oral, 4 times daily PRN   diphenoxylate-atropine (LOMOTIL) 2.5-0.025 MG tablet TAKE 2 TABLETS IN THE MORNING, AT NOON AND AT BEDTIME AS DIRECTED   DULoxetine (CYMBALTA) 60 mg, Oral, 2 times daily   fluticasone (FLONASE) 50 MCG/ACT nasal spray SPRAY 2 SPRAYS INTO EACH NOSTRIL EVERY DAY   furosemide (LASIX) 20 mg, Oral, Daily PRN   lipase/protease/amylase (CREON) 36000 UNITS CPEP capsule Take 2 capsules by mouth with each meal, and take 1 capsule by mouth with each snack (max 8 capsules daily).   loperamide (IMODIUM) 2 mg, Oral, 3 times daily PRN   losartan (COZAAR) 100 mg, Oral, Daily   mesalamine (LIALDA) 4.8 g, Oral, Daily with breakfast   Multiple Vitamin (MULTIVITAMIN WITH MINERALS) TABS tablet 1 tablet, Daily   omeprazole (PRILOSEC) 40 mg, Oral, Daily before breakfast, TAKE 1 CAPSULE (40 MG TOTAL) BY MOUTH IN THE MORNING, AT NOON, AND AT BEDTIME.   ondansetron (ZOFRAN) 4 mg, Oral, Every 8 hours PRN   oxycodone (OXY-IR) 5 mg, Oral, Every 6 hours PRN   valACYclovir (VALTREX) 1000 MG tablet TAKE 1 TABLET (1,000 MG TOTAL) BY MOUTH TWICE A DAY AS NEEDED    ROS - all of the below systems have been reviewed with the patient and positives are indicated with bold text General: chills, fever or night sweats Eyes: blurry vision or double vision ENT: epistaxis or sore throat Allergy/Immunology: itchy/watery eyes or nasal congestion Hematologic/Lymphatic: bleeding problems, blood clots or swollen lymph nodes Endocrine: temperature intolerance or unexpected weight changes Breast: new or changing breast lumps or nipple discharge Resp: cough, shortness of breath, or wheezing CV: chest pain or dyspnea on exertion GI: as per HPI GU: dysuria, trouble voiding, or hematuria MSK: joint pain  or joint stiffness Neuro: TIA or stroke symptoms Derm: pruritus and skin lesion changes Psych: anxiety and depression  Objective   PE There were no vitals taken for this visit. Constitutional: NAD; conversant; no deformities Eyes: Moist conjunctiva; no lid lag; anicteric; PERRL Neck: Trachea midline; no thyromegaly Lungs: Normal respiratory effort; no tactile fremitus CV: RRR; no palpable thrills; no pitting edema GI: Abd soft, nontender; no palpable hepatosplenomegaly MSK: Normal range of motion of extremities; no clubbing/cyanosis Psychiatric: Appropriate affect; alert and oriented x3 Lymphatic: No palpable cervical or axillary lymphadenopathy  No results found for this or any previous visit (from the past 24 hours).  Imaging Orders  No imaging studies ordered today     Assessment and Plan   [name]  is an 78 y.o. female a massive hiatal hernia and symptoms concerning for intermittent gastric volvulus. She has been to the emergency room multiple times with the symptoms. I recommend robotic hiatal hernia repair with possible gastrostomy tube placement. We discussed the procedure itself as well as its risk, benefits, and alternatives. After full discussion all questions answered patient granted some proceed. We will proceed as scheduled.  Quentin Ore, MD  [facility] Use [email or web address] to contact on call provider

## 2023-01-31 NOTE — Anesthesia Postprocedure Evaluation (Signed)
Anesthesia Post Note  Patient: [name]  Procedure(s) Performed: XI ROBOTIC ASSISTED HIATAL HERNIA REPAIR WITH MESH     Patient location during evaluation: PACU Anesthesia Type: General Level of consciousness: awake and alert Pain management: pain level controlled Vital Signs Assessment: post-procedure vital signs reviewed and stable Respiratory status: spontaneous breathing, nonlabored ventilation and respiratory function stable Cardiovascular status: blood pressure returned to baseline and stable Postop Assessment: no apparent nausea or vomiting Anesthetic complications: no   No notable events documented.  Last Vitals:  Vitals:   01/31/23 1300 01/31/23 1315  BP: (!) 173/96 (!) 167/92  Pulse: 73 70  Resp: 10 10  Temp:    SpO2: 96% 95%    Last Pain:  Vitals:   01/31/23 1300  TempSrc:   PainSc: Asleep                 Dodd Schmid A.

## 2023-01-31 NOTE — Anesthesia Procedure Notes (Signed)
Procedure Name: Intubation Date/Time: 01/31/2023 9:31 AM  Performed by: Nelle Don, CRNAPre-anesthesia Checklist: Patient identified, Emergency Drugs available, Suction available and Patient being monitored Patient Re-evaluated:Patient Re-evaluated prior to induction Oxygen Delivery Method: Circle system utilized Preoxygenation: Pre-oxygenation with 100% oxygen Induction Type: IV induction Ventilation: Mask ventilation without difficulty Laryngoscope Size: Mac and 3 Grade View: Grade II Tube type: Oral Tube size: 7.0 mm Number of attempts: 1 Airway Equipment and Method: Stylet Placement Confirmation: positive ETCO2, ETT inserted through vocal cords under direct vision and breath sounds checked- equal and bilateral Secured at: 21 cm Tube secured with: Tape Dental Injury: Teeth and Oropharynx as per pre-operative assessment

## 2023-01-31 NOTE — Discharge Instructions (Signed)
HIATAL HERNIA POST OPERATIVE INSTRUCTIONS  Thinking Clearly  The anesthesia may cause you to feel different for 1 or 2 days. Do not drive, drink alcohol, or make any big decisions for at least 2 days.  Nutrition When you wake up, you will be able to drink small amounts of liquid. If you do not feel sick, you can slowly advance your diet to regular foods. Continue to drink lots of fluids, usually about 8 to 10 glasses per day. Eat a high-fiber diet so you don't strain during bowel movements. High-Fiber Foods Foods high in fiber include beans, bran cereals and whole-grain breads, peas, dried fruit (figs, apricots, and dates), raspberries, blackberries, strawberries, sweet corn, broccoli, baked potatoes with skin, plums, pears, apples, greens, and nuts. Activity Slowly increase your activity. Be sure to get up and walk every hour or so to prevent blood clots. No heavy lifting or strenuous activity for 4 weeks following surgery to prevent hernias at your incision sites It is normal to feel tired. You may need more sleep than usual.  Get your rest but make sure to get up and move around frequently to prevent blood clots and pneumonia.  Work and Return to [employer] can go back to work when you feel well enough. Discuss the timing with your surgeon. You can usually go back to school or work 1 week after an operation. If your work requires heavy lifting or strenuous activity you need to be placed on light duty for 4 weeks following surgery. You can return to gym class, sports or other physical activities 4 weeks after surgery.  Wound Care Always wash your hands before and after touching near your incision site. Do not soak in a bathtub until cleared at your follow up appointment. You may take a shower 24 hours after surgery. A small amount of drainage from the incision is normal. If the drainage is thick and yellow or the site is red, you may have an infection, so call your surgeon. If you  have a drain in one of your incisions, it will be taken out in office when the drainage stops. Steri-Strips will fall off in 7 to 10 days or they will be removed during your first office visit. If you have dermabond glue covering over the incision, allow the glue to flake off on its own. Avoid wearing tight or rough clothing. It may rub your incisions and make it harder for them to heal. Protect the new skin, especially from the sun. The sun can burn and cause darker scarring. Your scar will heal in about 4 to 6 weeks and will become softer and continue to fade over the next year.  The cosmetic appearance of the incisions will improve over the course of the first year after surgery. Sensation around your incision will return in a few weeks or months.  Bowel Movements After intestinal surgery, you may have loose watery stools for several days. If watery diarrhea lasts longer than 3 days, contact your surgeon. Pain medication (narcotics) can cause constipation. Increase the fiber in your diet with high-fiber foods if you are constipated. You can take an over the counter stool softener like Colace to avoid constipation.  Additional over the counter medications can also be used if Colace isn't sufficient (for example, Milk of Magnesia or Miralax).  Pain The amount of pain is different for each person. Some people need only 1 to 3 doses of pain control medication, while others need more. Take alternating doses of  tylenol and ibuprofen around the clock for the first five days following surgery.  This will provide a baseline of pain control and help with inflammation.  Take the narcotic pain medication in addition if needed for severe pain.  Contact Your Surgeon at [phone], if you have: Pain in your right upper abdomen like a gallbladder attack. Pain that will not go away Pain that gets worse A fever of more than 101F (38.3C) Repeated vomiting Swelling, redness, bleeding, or bad-smelling  drainage from your wound site Strong abdominal pain No bowel movement or unable to pass gas for 3 days Watery diarrhea lasting longer than 3 days  Pain Control The goal of pain control is to minimize pain, keep you moving and help you heal. Your surgical team will work with you on your pain plan. Most often a combination of therapies and medications are used to control your pain. You may also be given medication (local anesthetic) at the surgical site. This may help control your pain for several days. Extreme pain puts extra stress on your body at a time when your body needs to focus on healing. Do not wait until your pain has reached a level "10" or is unbearable before telling your doctor or nurse. It is much easier to control pain before it becomes severe. Following a laparoscopic procedure, pain is sometimes felt in the shoulder. This is due to the gas inserted into your abdomen during the procedure. Moving and walking helps to decrease the gas and the right shoulder pain.  Use the guide below for ways to manage your post-operative pain. Learn more by going to facs.org/safepaincontrol.  How Intense Is My Pain Common Therapies to Feel Better       I hardly notice my pain, and it does not interfere with my activities.  I notice my pain and it distracts me, but I can still do activities (sitting up, walking, standing).  Non-Medication Therapies  Ice (in a bag, applied over clothing at the surgical site), elevation, rest, meditation, massage, distraction (music, TV, play) walking and mild exercise Splinting the abdomen with pillows +  Non-Opioid Medications Acetaminophen (Tylenol) Non-steroidal anti-inflammatory drugs (NSAIDS) Aspirin, Ibuprofen (Motrin, Advil) Naproxen (Aleve) Take these as needed, when you feel pain. Both acetaminophen and NSAIDs help to decrease pain and swelling (inflammation).      My pain is hard to ignore and is more noticeable even when I rest.  My  pain interferes with my usual activities.  Non-Medication Therapies  +  Non-Opioid medications  Take on a regular schedule (around-the-clock) instead of as needed. (For example, Tylenol every 6 hours at 9:00 am, 3:00 pm, 9:00 pm, 3:00 am and Motrin every 6 hours at 12:00 am, 6:00 am, 12:00 pm, 6:00 pm)         I am focused on my pain, and I am not doing my daily activities.  I am groaning in pain, and I cannot sleep. I am unable to do anything.  My pain is as bad as it could be, and nothing else matters.  Non-Medication Therapies  +  Around-the-Clock Non-Opioid Medications  +  Short-acting opioids  Opioids should be used with other medications to manage severe pain. Opioids block pain and give a feeling of euphoria (feel high). Addiction, a serious side effect of opioids, is rare with short-term (a few days) use.  Examples of short-acting opioids include: Tramadol (Ultram), Hydrocodone (Norco, Vicodin), Hydromorphone (Dilaudid), Oxycodone (Oxycontin)     The above directions have been adapted  from the [employer] of [employer] Surgical Patient Education Program.  Please refer to the ACS website if needed: [email or web address].   Ivar Drape, MD [facility] [address], [address], [place], [place]  [address] ?  [address] [address], [place], [place]   [address] [phone] ? [phone] ? FAX [phone] Web site: [email or web address]

## 2023-01-31 NOTE — Transfer of Care (Signed)
Immediate Anesthesia Transfer of Care Note  Patient: Jaydin I Ficco  Procedure(s) Performed: XI ROBOTIC ASSISTED HIATAL HERNIA REPAIR WITH MESH  Patient Location: PACU  Anesthesia Type:General  Level of Consciousness: drowsy  Airway & Oxygen Therapy: Patient Spontanous Breathing and Patient connected to face mask oxygen  Post-op Assessment: Report given to RN, Post -op Vital signs reviewed and stable, and Patient moving all extremities X 4  Post vital signs: Reviewed and stable  Last Vitals:  Vitals Value Taken Time  BP 210/105- labetalol given 01/31/23 1225  Temp    Pulse 82 01/31/23 1225  Resp 20 01/31/23 1225  SpO2 96 % 01/31/23 1225  Vitals shown include unfiled device data.  Last Pain:  Vitals:   01/31/23 0826  TempSrc:   PainSc: 0-No pain         Complications: No notable events documented.

## 2023-02-01 ENCOUNTER — Encounter (HOSPITAL_COMMUNITY): Payer: Self-pay | Admitting: Surgery

## 2023-02-01 DIAGNOSIS — Z886 Allergy status to analgesic agent status: Secondary | ICD-10-CM | POA: Diagnosis not present

## 2023-02-01 DIAGNOSIS — K219 Gastro-esophageal reflux disease without esophagitis: Secondary | ICD-10-CM | POA: Diagnosis present

## 2023-02-01 DIAGNOSIS — Z91048 Other nonmedicinal substance allergy status: Secondary | ICD-10-CM | POA: Diagnosis not present

## 2023-02-01 DIAGNOSIS — J449 Chronic obstructive pulmonary disease, unspecified: Secondary | ICD-10-CM | POA: Diagnosis present

## 2023-02-01 DIAGNOSIS — Z9842 Cataract extraction status, left eye: Secondary | ICD-10-CM | POA: Diagnosis not present

## 2023-02-01 DIAGNOSIS — Z885 Allergy status to narcotic agent status: Secondary | ICD-10-CM | POA: Diagnosis not present

## 2023-02-01 DIAGNOSIS — Z888 Allergy status to other drugs, medicaments and biological substances status: Secondary | ICD-10-CM | POA: Diagnosis not present

## 2023-02-01 DIAGNOSIS — Z87891 Personal history of nicotine dependence: Secondary | ICD-10-CM | POA: Diagnosis not present

## 2023-02-01 DIAGNOSIS — I10 Essential (primary) hypertension: Secondary | ICD-10-CM | POA: Diagnosis present

## 2023-02-01 DIAGNOSIS — Z91040 Latex allergy status: Secondary | ICD-10-CM | POA: Diagnosis not present

## 2023-02-01 DIAGNOSIS — Z7951 Long term (current) use of inhaled steroids: Secondary | ICD-10-CM | POA: Diagnosis not present

## 2023-02-01 DIAGNOSIS — Z96653 Presence of artificial knee joint, bilateral: Secondary | ICD-10-CM | POA: Diagnosis present

## 2023-02-01 DIAGNOSIS — Z79899 Other long term (current) drug therapy: Secondary | ICD-10-CM | POA: Diagnosis not present

## 2023-02-01 DIAGNOSIS — Z9841 Cataract extraction status, right eye: Secondary | ICD-10-CM | POA: Diagnosis not present

## 2023-02-01 DIAGNOSIS — Z881 Allergy status to other antibiotic agents status: Secondary | ICD-10-CM | POA: Diagnosis not present

## 2023-02-01 DIAGNOSIS — K449 Diaphragmatic hernia without obstruction or gangrene: Secondary | ICD-10-CM | POA: Diagnosis present

## 2023-02-01 DIAGNOSIS — K5090 Crohn's disease, unspecified, without complications: Secondary | ICD-10-CM | POA: Diagnosis present

## 2023-02-01 LAB — CBC
HCT: 32.6 % — ABNORMAL LOW (ref 36.0–46.0)
Hemoglobin: 10.7 g/dL — ABNORMAL LOW (ref 12.0–15.0)
MCH: 31.5 pg (ref 26.0–34.0)
MCHC: 32.8 g/dL (ref 30.0–36.0)
MCV: 95.9 fL (ref 80.0–100.0)
Platelets: 284 10*3/uL (ref 150–400)
RBC: 3.4 MIL/uL — ABNORMAL LOW (ref 3.87–5.11)
RDW: 16.9 % — ABNORMAL HIGH (ref 11.5–15.5)
WBC: 11.1 10*3/uL — ABNORMAL HIGH (ref 4.0–10.5)
nRBC: 0 % (ref 0.0–0.2)

## 2023-02-01 LAB — BASIC METABOLIC PANEL
Anion gap: 10 (ref 5–15)
BUN: 28 mg/dL — ABNORMAL HIGH (ref 8–23)
CO2: 23 mmol/L (ref 22–32)
Calcium: 8.5 mg/dL — ABNORMAL LOW (ref 8.9–10.3)
Chloride: 105 mmol/L (ref 98–111)
Creatinine, Ser: 1.29 mg/dL — ABNORMAL HIGH (ref 0.44–1.00)
GFR, Estimated: 43 mL/min — ABNORMAL LOW (ref >60)
Glucose, Bld: 105 mg/dL — ABNORMAL HIGH (ref 70–99)
Potassium: 4.4 mmol/L (ref 3.5–5.1)
Sodium: 138 mmol/L (ref 135–145)

## 2023-02-01 MED ORDER — ENOXAPARIN SODIUM 30 MG/0.3ML IJ SOSY
30.0000 mg | PREFILLED_SYRINGE | INTRAMUSCULAR | Status: DC
  Administered 2023-02-01 – 2023-02-03 (×3): 30 mg via SUBCUTANEOUS
  Filled 2023-02-01 (×2): qty 0.3

## 2023-02-01 NOTE — Progress Notes (Signed)
Bladder scan results: >200 ml
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
## 2023-02-01 NOTE — Progress Notes (Signed)
PHARMACY NOTE: RENAL DOSAGE ADJUSTMENT  Renal Function: Estimated Creatinine Clearance: 26.2 mL/min (A) (by C-G formula based on SCr of 1.29 mg/dL (H)).   Plan: Lovenox 40 mg>> 30 mg   Herby Abraham, Pharm.D Use secure chat for questions 02/01/2023 9:14 AM

## 2023-02-02 LAB — BASIC METABOLIC PANEL
Anion gap: 7 (ref 5–15)
BUN: 22 mg/dL (ref 8–23)
CO2: 24 mmol/L (ref 22–32)
Calcium: 8.4 mg/dL — ABNORMAL LOW (ref 8.9–10.3)
Chloride: 107 mmol/L (ref 98–111)
Creatinine, Ser: 0.88 mg/dL (ref 0.44–1.00)
GFR, Estimated: >60 mL/min (ref >60)
Glucose, Bld: 95 mg/dL (ref 70–99)
Potassium: 4.1 mmol/L (ref 3.5–5.1)
Sodium: 138 mmol/L (ref 135–145)

## 2023-02-02 LAB — CBC
HCT: 31 % — ABNORMAL LOW (ref 36.0–46.0)
Hemoglobin: 10.3 g/dL — ABNORMAL LOW (ref 12.0–15.0)
MCH: 32.3 pg (ref 26.0–34.0)
MCHC: 33.2 g/dL (ref 30.0–36.0)
MCV: 97.2 fL (ref 80.0–100.0)
Platelets: 282 10*3/uL (ref 150–400)
RBC: 3.19 MIL/uL — ABNORMAL LOW (ref 3.87–5.11)
RDW: 16.8 % — ABNORMAL HIGH (ref 11.5–15.5)
WBC: 10.2 10*3/uL (ref 4.0–10.5)
nRBC: 0 % (ref 0.0–0.2)
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
## 2023-02-02 NOTE — Plan of Care (Signed)
  Problem: Health Behavior/Discharge Planning: Goal: Ability to manage health-related needs will improve Outcome: Progressing   Problem: Clinical Measurements: Goal: Will remain free from infection Outcome: Progressing Goal: Diagnostic test results will improve Outcome: Progressing   Problem: Nutrition: Goal: Adequate nutrition will be maintained Outcome: Progressing   Problem: Elimination: Goal: Will not experience complications related to bowel motility Outcome: Progressing   Problem: Pain Managment: Goal: General experience of comfort will improve and/or be controlled Outcome: Progressing
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
## 2023-02-03 ENCOUNTER — Ambulatory Visit: Payer: Medicare Other | Admitting: Nurse Practitioner

## 2023-02-03 LAB — BASIC METABOLIC PANEL
Anion gap: 9 (ref 5–15)
BUN: 24 mg/dL — ABNORMAL HIGH (ref 8–23)
CO2: 25 mmol/L (ref 22–32)
Calcium: 8.4 mg/dL — ABNORMAL LOW (ref 8.9–10.3)
Chloride: 100 mmol/L (ref 98–111)
Creatinine, Ser: 1.11 mg/dL — ABNORMAL HIGH (ref 0.44–1.00)
GFR, Estimated: 51 mL/min — ABNORMAL LOW (ref >60)
Glucose, Bld: 115 mg/dL — ABNORMAL HIGH (ref 70–99)
Potassium: 4 mmol/L (ref 3.5–5.1)
Sodium: 134 mmol/L — ABNORMAL LOW (ref 135–145)

## 2023-02-03 LAB — CBC
HCT: 30.5 % — ABNORMAL LOW (ref 36.0–46.0)
Hemoglobin: 10.1 g/dL — ABNORMAL LOW (ref 12.0–15.0)
MCH: 32.1 pg (ref 26.0–34.0)
MCHC: 33.1 g/dL (ref 30.0–36.0)
MCV: 96.8 fL (ref 80.0–100.0)
Platelets: 286 10*3/uL (ref 150–400)
RBC: 3.15 MIL/uL — ABNORMAL LOW (ref 3.87–5.11)
RDW: 16.4 % — ABNORMAL HIGH (ref 11.5–15.5)
WBC: 9.8 10*3/uL (ref 4.0–10.5)
nRBC: 0 % (ref 0.0–0.2)

## 2023-02-03 NOTE — Plan of Care (Signed)
Problem: Activity: Goal: Risk for activity intolerance will decrease Outcome: Progressing   Problem: Nutrition: Goal: Adequate nutrition will be maintained Outcome: Progressing
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
## 2023-02-03 NOTE — Progress Notes (Signed)
Patient discharged home, IV removed, discharge paperwork provided and explained to patient as well as patient's husband, patient and patient's husband verbalized understanding.

## 2023-02-03 NOTE — Progress Notes (Signed)
OT Cancellation Note  Patient Details Name: [name] MRN: [id number] DOB: 1946/01/05   Cancelled Treatment:    Reason Eval/Treat Not Completed: OT screened, no needs identified, will sign off New OT orders received for bilateral hand weakness. Spoke with pt, who reports that symptoms have resolved once stopped taking Gabapentin medication and it was determined that hand weakness was more than likely a medication side effect. Pt currently being discharged from [facility] this AM. No new follow up recommendations needs at this time.    [name], OTR/L,CBIS  Supplemental OT - [facility] Preferred   02/03/2023, 9:49 AM

## 2023-02-04 ENCOUNTER — Telehealth: Payer: Self-pay

## 2023-02-04 ENCOUNTER — Telehealth: Payer: Self-pay | Admitting: Pharmacy Technician

## 2023-02-04 ENCOUNTER — Other Ambulatory Visit (HOSPITAL_COMMUNITY): Payer: Self-pay

## 2023-02-04 NOTE — Telephone Encounter (Signed)
Submitted Patient Assistance Application to [facility] for CREON along with insurance card copy. Will update patient when we receive a response.  PHONE: [phone] FAX: [phone]

## 2023-02-04 NOTE — Transitions of Care (Post Inpatient/ED Visit) (Signed)
   02/04/2023  Name: DEMEISHA GERAGHTY MRN: [id number] DOB: 1945/03/13  Today's TOC FU Call Status: Today's TOC FU Call Status:: Unsuccessful Call (1st Attempt) Unsuccessful Call (1st Attempt) Date: 02/04/23  Attempted to reach the patient regarding the most recent Inpatient/ED visit.  Follow Up Plan: Additional outreach attempts will be made to reach the patient to complete the Transitions of Care (Post Inpatient/ED visit) call.   Lonia Chimera, RN, BSN, CEN [employer]- Transition of Care Team.  [facility] [phone]

## 2023-02-05 ENCOUNTER — Telehealth: Payer: Self-pay

## 2023-02-05 NOTE — Transitions of Care (Post Inpatient/ED Visit) (Signed)
   02/05/2023  Name: LINDYN VOSSLER MRN: [id number] DOB: [birth year]  Today's TOC FU Call Status: Today's TOC FU Call Status:: Unsuccessful Call (2nd Attempt) Unsuccessful Call (2nd Attempt) Date: 02/05/23  Attempted to reach the patient regarding the most recent Inpatient/ED visit.  Follow Up Plan: Additional outreach attempts will be made to reach the patient to complete the Transitions of Care (Post Inpatient/ED visit) call.   Lonia Chimera, RN, BSN, CEN [employer]- Transition of Care Team.  [facility] [phone]

## 2023-02-06 ENCOUNTER — Telehealth: Payer: Self-pay

## 2023-02-06 NOTE — Transitions of Care (Post Inpatient/ED Visit) (Signed)
   02/06/2023  Name: [name] MRN: [id number] DOB: Aug 15, 1945  Today's TOC FU Call Status: Today's TOC FU Call Status:: Unsuccessful Call (3rd Attempt) Unsuccessful Call (3rd Attempt) Date: 02/06/23  Attempted to reach the patient regarding the most recent Inpatient/ED visit.  Follow Up Plan: No further outreach attempts will be made at this time. We have been unable to contact the patient.  Lonia Chimera, RN, BSN, CEN [employer]- Transition of Care Team.  [facility] [phone]

## 2023-02-10 ENCOUNTER — Other Ambulatory Visit: Payer: Self-pay | Admitting: Student

## 2023-02-10 DIAGNOSIS — Z931 Gastrostomy status: Secondary | ICD-10-CM

## 2023-02-20 ENCOUNTER — Encounter: Payer: Self-pay | Admitting: Internal Medicine

## 2023-02-20 ENCOUNTER — Ambulatory Visit: Payer: Medicare Other | Admitting: Internal Medicine

## 2023-02-20 VITALS — BP 120/60 | HR 70 | Ht 60.0 in | Wt 111.2 lb

## 2023-02-20 DIAGNOSIS — R197 Diarrhea, unspecified: Secondary | ICD-10-CM

## 2023-02-20 DIAGNOSIS — K449 Diaphragmatic hernia without obstruction or gangrene: Secondary | ICD-10-CM | POA: Diagnosis not present

## 2023-02-20 DIAGNOSIS — K589 Irritable bowel syndrome without diarrhea: Secondary | ICD-10-CM

## 2023-02-20 DIAGNOSIS — K529 Noninfective gastroenteritis and colitis, unspecified: Secondary | ICD-10-CM | POA: Diagnosis not present

## 2023-02-20 DIAGNOSIS — R109 Unspecified abdominal pain: Secondary | ICD-10-CM | POA: Diagnosis not present

## 2023-02-20 DIAGNOSIS — Z8719 Personal history of other diseases of the digestive system: Secondary | ICD-10-CM

## 2023-02-20 DIAGNOSIS — K219 Gastro-esophageal reflux disease without esophagitis: Secondary | ICD-10-CM | POA: Diagnosis not present

## 2023-02-20 DIAGNOSIS — K50919 Crohn's disease, unspecified, with unspecified complications: Secondary | ICD-10-CM

## 2023-02-20 NOTE — Patient Instructions (Signed)
_______________________________________________________  If your blood pressure at your visit was 140/90 or greater, please contact your primary care physician to follow up on this.  _______________________________________________________  If you are age 78 or older, your body mass index should be between 23-30. Your Body mass index is 21.72 kg/m. If this is out of the aforementioned range listed, please consider follow up with your Primary Care Provider.  If you are age 40 or younger, your body mass index should be between 19-25. Your Body mass index is 21.72 kg/m. If this is out of the aformentioned range listed, please consider follow up with your Primary Care Provider.   ________________________________________________________  The [facility] providers would like to encourage you to use [facility] to communicate with providers for non-urgent requests or questions.  Due to long hold times on the telephone, sending your provider a message by [facility] may be a faster and more efficient way to get a response.  Please allow 48 business hours for a response.  Please remember that this is for non-urgent requests.  _______________________________________________________
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
## 2023-02-24 ENCOUNTER — Telehealth: Payer: Self-pay

## 2023-02-24 NOTE — Telephone Encounter (Signed)
Spoke to the patient and explained that Dr Ardyth Harps was not in the office today.  I offered an office visit with a different provider, but the patient declined.

## 2023-02-24 NOTE — Telephone Encounter (Signed)
Copied from CRM [id number]. Topic: Clinical - Medical Advice >> Feb 24, 2023  9:04 AM Adele Barthel wrote: Reason for CRM: Patient tested positive for COVID yesterday.  Recently had hiatal hernia surgery and would like to know what over-the-counter meds she can take and other at home treatments, to aid with symptoms. Would like to also know if she  would need to be evaluated by provider.   CB# [phone]

## 2023-02-25 ENCOUNTER — Encounter: Payer: Self-pay | Admitting: Internal Medicine

## 2023-02-25 ENCOUNTER — Telehealth: Payer: Self-pay

## 2023-02-25 DIAGNOSIS — J302 Other seasonal allergic rhinitis: Secondary | ICD-10-CM

## 2023-02-25 DIAGNOSIS — I10 Essential (primary) hypertension: Secondary | ICD-10-CM

## 2023-02-25 DIAGNOSIS — F339 Major depressive disorder, recurrent, unspecified: Secondary | ICD-10-CM

## 2023-02-25 DIAGNOSIS — K219 Gastro-esophageal reflux disease without esophagitis: Secondary | ICD-10-CM

## 2023-02-25 MED ORDER — DULOXETINE HCL 60 MG PO CPEP: 60.0000 mg | ORAL_CAPSULE | Freq: Two times a day (BID) | ORAL | 1 refills | Status: DC

## 2023-02-25 MED ORDER — FLUTICASONE PROPIONATE 50 MCG/ACT NA SUSP: 1.0000 | Freq: Every day | NASAL | 1 refills | Status: DC

## 2023-02-25 MED ORDER — CETIRIZINE HCL 10 MG PO TABS: 10.0000 mg | ORAL_TABLET | Freq: Every day | ORAL | 1 refills | Status: DC

## 2023-02-25 MED ORDER — LOSARTAN POTASSIUM 100 MG PO TABS: 100.0000 mg | ORAL_TABLET | Freq: Every day | ORAL | 1 refills | Status: DC

## 2023-02-25 NOTE — Telephone Encounter (Signed)
 Left message on machine for patient to return our call

## 2023-02-25 NOTE — Telephone Encounter (Signed)
Answered via Mychart

## 2023-02-25 NOTE — Telephone Encounter (Signed)
Copied from CRM [id number]. Topic: General - Other >> Feb 25, 2023 11:38 AM Denese Killings wrote: Reason for CRM: Patient was returning a call from [place]. Patient advised that she doesn't need an appointment and is feeling much better.

## 2023-03-06 ENCOUNTER — Ambulatory Visit
Admission: RE | Admit: 2023-03-06 | Discharge: 2023-03-06 | Disposition: A | Discharge status: Home / Self Care | Payer: Medicare Other | Source: Ambulatory Visit | Attending: Student | Admitting: Student

## 2023-03-06 ENCOUNTER — Other Ambulatory Visit: Payer: Self-pay | Admitting: Student

## 2023-03-06 DIAGNOSIS — Z931 Gastrostomy status: Secondary | ICD-10-CM

## 2023-03-19 ENCOUNTER — Ambulatory Visit (INDEPENDENT_AMBULATORY_CARE_PROVIDER_SITE_OTHER): Admitting: Internal Medicine

## 2023-03-19 ENCOUNTER — Encounter: Payer: Self-pay | Admitting: Internal Medicine

## 2023-03-19 VITALS — BP 110/70 | HR 91 | Temp 98.2°F | Wt 112.9 lb

## 2023-03-19 DIAGNOSIS — H9192 Unspecified hearing loss, left ear: Secondary | ICD-10-CM | POA: Diagnosis not present

## 2023-03-19 NOTE — Progress Notes (Signed)
 Established Patient Office Visit     CC/Reason for Visit: "I have a clogged left ear"  HPI: [name] is a 78 y.o. female who is coming in today for the above mentioned reasons.  She is having decreased hearing out of the left ear.  She wants to make sure there is no wax impaction.   Past Medical/Surgical History: Past Medical History:  Diagnosis Date   Allergies    Anemia    Bruises easily    Complication of anesthesia    Crohn's disease (HCC)    GI Dr Faylene Million in [place], [place]    Deviated septum    GERD (gastroesophageal reflux disease)    History of hiatal hernia    HTN (hypertension)    pcp Dr Lucienne Minks  in [place], [place]    Kidney disease, chronic, stage III (GFR 30-59 ml/min) (HCC)    Neuropathy    fingers,periodically   Osteoarthritis    Pancreatic insufficiency    PONV (postoperative nausea and vomiting)    Scoliosis    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   Whooping cough 2015   Whooping cough with pneumonia    2017    Past Surgical History:  Procedure Laterality Date   BIOPSY  11/05/2022   Procedure: BIOPSY;  Surgeon: Beverley Fiedler, MD;  Location[facility];  Service: Gastroenterology;;   CATARACT EXTRACTION, BILATERAL  2018   with IOC implant    COLONOSCOPY     DEBRIDEMENT AND CLOSURE WOUND Right 10/01/2018   Procedure: Excision of right knee wound with placement of ACell and Pravena;  Surgeon: Peggye Form, DO;  Location: [facility];  Service: Plastics;  Laterality: Right;  30 min   ESOPHAGOGASTRODUODENOSCOPY (EGD) WITH PROPOFOL N/A 11/05/2022   Procedure: ESOPHAGOGASTRODUODENOSCOPY (EGD) WITH PROPOFOL;  Surgeon: Beverley Fiedler, MD;  Location: [facility] ENDOSCOPY;  Service: Gastroenterology;  Laterality: N/A;   HERNIA REPAIR     knee fracture  Right    NASAL FRACTURE SURGERY     needed b/c she was getting frequent sinus infections    TONSILLECTOMY     TOTAL KNEE ARTHROPLASTY Right 07/28/2018    Procedure: TOTAL KNEE ARTHROPLASTY;  Surgeon: Durene Romans, MD;  Location: [facility] ORS;  Service: Orthopedics;  Laterality: Right;   TOTAL KNEE ARTHROPLASTY Left 03/02/2019   Procedure: TOTAL KNEE ARTHROPLASTY;  Surgeon: Durene Romans, MD;  Location: [facility];  Service: Orthopedics;  Laterality: Left;  70 mins   UPPER GASTROINTESTINAL ENDOSCOPY     XI ROBOTIC ASSISTED HIATAL HERNIA REPAIR N/A 01/31/2023   Procedure: XI ROBOTIC ASSISTED HIATAL HERNIA REPAIR WITH MESH;  Surgeon: Stechschulte, Hyman Hopes, MD;  Location: [facility] ORS;  Service: General;  Laterality: N/A;    Social History:  reports that she quit smoking about 35 years ago. Her smoking use included cigarettes. She started smoking about 65 years ago. She has a 30 pack-year smoking history. She has never used smokeless tobacco. She reports that she does not currently use alcohol. She reports that she does not use drugs.  Allergies: Allergies  Allergen Reactions   Adhesive [Tape]     Redness and skin peeling   Aspirin     Intestinal Bleeding   Ciprofloxacin Nausea And Vomiting   Codeine Nausea And Vomiting   Demerol [Meperidine Hcl] Nausea And Vomiting   Fentanyl Nausea And Vomiting    Makes me very sick   Lactose Intolerance (Gi) Other (See Comments)  Pt has Crohn's    Latex     Redness and skin peeling   Other Itching    Nuts-itching in throat  Seeds-stomach issues with chrons  Pt has emphysema    Prednisone Other (See Comments)    Upset stomach   Septra [Sulfamethoxazole-Trimethoprim] Nausea Only    Family History:  Family History  Problem Relation Age of Onset   Diabetes Father    Parkinson's disease Father    Colon cancer Other    Asthma Neg Hx    Esophageal cancer Neg Hx    Pancreatic cancer Neg Hx    Liver disease Neg Hx    Stomach cancer Neg Hx      Current Outpatient Medications:    albuterol (PROVENTIL) (2.5 MG/3ML) 0.083% nebulizer solution, Take 3 mLs (2.5 mg total) by nebulization every 6 (six) hours as  needed for wheezing or shortness of breath., Disp: 75 mL, Rfl: 5   albuterol (VENTOLIN HFA) 108 (90 Base) MCG/ACT inhaler, Inhale 2 puffs into the lungs every 6 (six) hours as needed for wheezing or shortness of breath., Disp: 8 g, Rfl: 6   Budeson-Glycopyrrol-Formoterol (BREZTRI AEROSPHERE) 160-9-4.8 MCG/ACT AERO, Inhale 2 puffs into the lungs in the morning and at bedtime., Disp: , Rfl:    budesonide-formoterol (SYMBICORT) 160-4.5 MCG/ACT inhaler, Inhale 2 puffs into the lungs in the morning and at bedtime., Disp: 1 each, Rfl: 12   Calcium Citrate-Vitamin D (CALCIUM + D PO), Take 1 tablet by mouth 2 (two) times daily., Disp: , Rfl:    cetirizine (ZYRTEC) 10 MG tablet, Take 1 tablet (10 mg total) by mouth daily., Disp: 90 tablet, Rfl: 1   colestipol (COLESTID) 1 g tablet, Take 2 tablets (2 g total) by mouth 2 (two) times daily., Disp: 180 tablet, Rfl: 3   dicyclomine (BENTYL) 20 MG tablet, Take 1 tablet (20 mg total) by mouth 4 (four) times daily as needed for spasms., Disp: 360 tablet, Rfl: 3   diphenoxylate-atropine (LOMOTIL) 2.5-0.025 MG tablet, TAKE 2 TABLETS IN THE MORNING, AT NOON AND AT BEDTIME AS DIRECTED, Disp: 180 tablet, Rfl: 3   DULoxetine (CYMBALTA) 60 MG capsule, Take 1 capsule (60 mg total) by mouth 2 (two) times daily., Disp: 180 capsule, Rfl: 1   fluticasone (FLONASE) 50 MCG/ACT nasal spray, Place 1 spray into both nostrils daily., Disp: 48 mL, Rfl: 1   furosemide (LASIX) 20 MG tablet, TAKE 1 TABLET (20 MG TOTAL) BY MOUTH DAILY AS NEEDED FOR EDEMA., Disp: 90 tablet, Rfl: 1   losartan (COZAAR) 100 MG tablet, Take 1 tablet (100 mg total) by mouth daily., Disp: 90 tablet, Rfl: 1   mesalamine (LIALDA) 1.2 g EC tablet, Take 4 tablets (4.8 g total) by mouth daily with breakfast., Disp: 360 tablet, Rfl: 3   Multiple Vitamin (MULTIVITAMIN WITH MINERALS) TABS tablet, Take 1 tablet by mouth daily., Disp: , Rfl:    omeprazole (PRILOSEC) 40 MG capsule, Take 1 capsule (40 mg total) by mouth daily  before breakfast. TAKE 1 CAPSULE (40 MG TOTAL) BY MOUTH IN THE MORNING, AT NOON, AND AT BEDTIME., Disp: 90 capsule, Rfl: 3   ondansetron (ZOFRAN) 4 MG tablet, Take 1 tablet (4 mg total) by mouth every 8 (eight) hours as needed for nausea or vomiting., Disp: 90 tablet, Rfl: 0   oxycodone (OXY-IR) 5 MG capsule, Take 1 capsule (5 mg total) by mouth every 6 (six) hours as needed for pain., Disp: 30 capsule, Rfl: 0   oxyCODONE-acetaminophen (PERCOCET) 5-325 MG tablet, Take  1 tablet by mouth every 4 (four) hours as needed for severe pain (pain score 7-10)., Disp: 20 tablet, Rfl: 0   valACYclovir (VALTREX) 1000 MG tablet, TAKE 1 TABLET (1,000 MG TOTAL) BY MOUTH TWICE A DAY AS NEEDED, Disp: 90 tablet, Rfl: 1  Review of Systems:  Negative unless indicated in HPI.   Physical Exam: Vitals:   03/19/23 1552  BP: 110/70  Pulse: 91  Temp: 98.2 F (36.8 C)  TempSrc: Oral  SpO2: 91%  Weight: 112 lb 14.4 oz (51.2 kg)    Body mass index is 22.05 kg/m.   Physical Exam HENT:     Left Ear: Tympanic membrane, ear canal and external ear normal. Decreased hearing noted.  No middle ear effusion. There is no impacted cerumen.      Impression and Plan:  Decreased hearing, left  -No cerumen impaction is evident on exam.  Requesting ENT referral.   Time spent:21 minutes reviewing chart, interviewing and examining patient and formulating plan of care.     Chaya Jan, MD [facility]

## 2023-04-13 ENCOUNTER — Other Ambulatory Visit: Payer: Self-pay | Admitting: Internal Medicine

## 2023-04-13 DIAGNOSIS — J302 Other seasonal allergic rhinitis: Secondary | ICD-10-CM

## 2023-04-13 DIAGNOSIS — R609 Edema, unspecified: Secondary | ICD-10-CM

## 2023-04-16 ENCOUNTER — Encounter (HOSPITAL_BASED_OUTPATIENT_CLINIC_OR_DEPARTMENT_OTHER): Payer: Self-pay | Admitting: Emergency Medicine

## 2023-04-16 ENCOUNTER — Emergency Department (HOSPITAL_BASED_OUTPATIENT_CLINIC_OR_DEPARTMENT_OTHER)

## 2023-04-16 ENCOUNTER — Emergency Department (HOSPITAL_BASED_OUTPATIENT_CLINIC_OR_DEPARTMENT_OTHER): Admitting: Radiology

## 2023-04-16 ENCOUNTER — Other Ambulatory Visit: Payer: Self-pay

## 2023-04-16 ENCOUNTER — Emergency Department (HOSPITAL_BASED_OUTPATIENT_CLINIC_OR_DEPARTMENT_OTHER)
Admission: EM | Admit: 2023-04-16 | Discharge: 2023-04-17 | Disposition: A | Discharge status: Home / Self Care | Attending: Emergency Medicine | Admitting: Emergency Medicine

## 2023-04-16 DIAGNOSIS — N189 Chronic kidney disease, unspecified: Secondary | ICD-10-CM | POA: Insufficient documentation

## 2023-04-16 DIAGNOSIS — Z79899 Other long term (current) drug therapy: Secondary | ICD-10-CM | POA: Insufficient documentation

## 2023-04-16 DIAGNOSIS — N179 Acute kidney failure, unspecified: Secondary | ICD-10-CM | POA: Diagnosis not present

## 2023-04-16 DIAGNOSIS — R140 Abdominal distension (gaseous): Secondary | ICD-10-CM | POA: Insufficient documentation

## 2023-04-16 DIAGNOSIS — Z91040 Latex allergy status: Secondary | ICD-10-CM | POA: Diagnosis not present

## 2023-04-16 DIAGNOSIS — K5090 Crohn's disease, unspecified, without complications: Secondary | ICD-10-CM | POA: Diagnosis not present

## 2023-04-16 DIAGNOSIS — R1033 Periumbilical pain: Secondary | ICD-10-CM | POA: Insufficient documentation

## 2023-04-16 DIAGNOSIS — I129 Hypertensive chronic kidney disease with stage 1 through stage 4 chronic kidney disease, or unspecified chronic kidney disease: Secondary | ICD-10-CM | POA: Insufficient documentation

## 2023-04-16 DIAGNOSIS — K5000 Crohn's disease of small intestine without complications: Secondary | ICD-10-CM

## 2023-04-16 LAB — CBC
HCT: 36.2 % (ref 36.0–46.0)
Hemoglobin: 12.5 g/dL (ref 12.0–15.0)
MCH: 31.3 pg (ref 26.0–34.0)
MCHC: 34.5 g/dL (ref 30.0–36.0)
MCV: 90.5 fL (ref 80.0–100.0)
Platelets: 369 10*3/uL (ref 150–400)
RBC: 4 MIL/uL (ref 3.87–5.11)
RDW: 15.2 % (ref 11.5–15.5)
WBC: 10 10*3/uL (ref 4.0–10.5)
nRBC: 0 % (ref 0.0–0.2)

## 2023-04-16 LAB — COMPREHENSIVE METABOLIC PANEL WITH GFR
ALT: 12 U/L (ref 0–44)
AST: 18 U/L (ref 15–41)
Albumin: 3.9 g/dL (ref 3.5–5.0)
Alkaline Phosphatase: 83 U/L (ref 38–126)
Anion gap: 12 (ref 5–15)
BUN: 68 mg/dL — ABNORMAL HIGH (ref 8–23)
CO2: 15 mmol/L — ABNORMAL LOW (ref 22–32)
Calcium: 8.9 mg/dL (ref 8.9–10.3)
Chloride: 103 mmol/L (ref 98–111)
Creatinine, Ser: 3.1 mg/dL — ABNORMAL HIGH (ref 0.44–1.00)
GFR, Estimated: 15 mL/min — ABNORMAL LOW (ref >60)
Glucose, Bld: 118 mg/dL — ABNORMAL HIGH (ref 70–99)
Potassium: 4.5 mmol/L (ref 3.5–5.1)
Sodium: 130 mmol/L — ABNORMAL LOW (ref 135–145)
Total Bilirubin: 0.5 mg/dL (ref 0.0–1.2)
Total Protein: 7.2 g/dL (ref 6.5–8.1)

## 2023-04-16 LAB — URINALYSIS, ROUTINE W REFLEX MICROSCOPIC
Bacteria, UA: NONE SEEN
Bilirubin Urine: NEGATIVE
Glucose, UA: NEGATIVE mg/dL
Hgb urine dipstick: NEGATIVE
Leukocytes,Ua: NEGATIVE
Nitrite: NEGATIVE
Protein, ur: 30 mg/dL — AB
Specific Gravity, Urine: 1.019 (ref 1.005–1.030)
pH: 5.5 (ref 5.0–8.0)

## 2023-04-16 LAB — TROPONIN I (HIGH SENSITIVITY): Troponin I (High Sensitivity): 4 ng/L (ref <18)

## 2023-04-16 LAB — LIPASE, BLOOD: Lipase: 17 U/L (ref 11–51)

## 2023-04-16 MED ORDER — HYDROMORPHONE HCL 1 MG/ML IJ SOLN
0.5000 mg | Freq: Once | INTRAMUSCULAR | Status: AC
  Administered 2023-04-16: 0.5 mg via INTRAVENOUS
  Filled 2023-04-16: qty 1

## 2023-04-16 NOTE — ED Provider Notes (Signed)
 [place] EMERGENCY DEPARTMENT AT [facility] Provider Note   CSN: [id number] Arrival date & time: 04/16/23  1908     History  Chief Complaint  Patient presents with   Abdominal Pain    [name] is a 78 y.o. female.  Patient to ED for evaluation of abdominal pain that started yesterday. History of Crohn's, history of [employer] Fundoplication in January of this year. No fever, nausea. She usually has multiple bowel movements daily but has not had a bowel movement or passed gas since yesterday. She feels bloated. She has had a cough for several days that has been forceful enough to cause concern for injury of the recently repaired hiatal hernia.   The history is provided by the patient and the spouse. No language interpreter was used.  Abdominal Pain      Home Medications Prior to Admission medications   Medication Sig Start Date End Date Taking? Authorizing Provider  albuterol (PROVENTIL) (2.5 MG/3ML) 0.083% nebulizer solution Take 3 mLs (2.5 mg total) by nebulization every 6 (six) hours as needed for wheezing or shortness of breath. 05/27/22   Cobb, Ruby Cola, NP  albuterol (VENTOLIN HFA) 108 (90 Base) MCG/ACT inhaler Inhale 2 puffs into the lungs every 6 (six) hours as needed for wheezing or shortness of breath. 05/27/22   Cobb, Ruby Cola, NP  Budeson-Glycopyrrol-Formoterol (BREZTRI AEROSPHERE) 160-9-4.8 MCG/ACT AERO Inhale 2 puffs into the lungs in the morning and at bedtime. 01/02/23   Martina Sinner, MD  budesonide-formoterol (SYMBICORT) 160-4.5 MCG/ACT inhaler Inhale 2 puffs into the lungs in the morning and at bedtime. 05/27/22   Cobb, Ruby Cola, NP  Calcium Citrate-Vitamin D (CALCIUM + D PO) Take 1 tablet by mouth 2 (two) times daily.    [provider]  cetirizine (ZYRTEC) 10 MG tablet Take 1 tablet (10 mg total) by mouth daily. 02/25/23   Philip Aspen, Limmie Patricia, MD  colestipol (COLESTID) 1 g tablet Take 2 tablets (2 g total) by mouth 2 (two)  times daily. 11/21/22   Hilarie Fredrickson, MD  dicyclomine (BENTYL) 20 MG tablet Take 1 tablet (20 mg total) by mouth 4 (four) times daily as needed for spasms. 11/21/22   Hilarie Fredrickson, MD  diphenoxylate-atropine (LOMOTIL) 2.5-0.025 MG tablet TAKE 2 TABLETS IN THE MORNING, AT [facility] AND AT BEDTIME AS DIRECTED 11/21/22   Hilarie Fredrickson, MD  DULoxetine (CYMBALTA) 60 MG capsule Take 1 capsule (60 mg total) by mouth 2 (two) times daily. 02/25/23   Philip Aspen, Limmie Patricia, MD  fluticasone [facility]) 50 MCG/ACT nasal spray SPRAY 2 SPRAYS INTO EACH NOSTRIL EVERY DAY 04/14/23   Philip Aspen, Limmie Patricia, MD  furosemide (LASIX) 20 MG tablet TAKE 1 TABLET (20 MG TOTAL) BY MOUTH DAILY AS NEEDED FOR EDEMA. 04/14/23   Philip Aspen, Limmie Patricia, MD  losartan (COZAAR) 100 MG tablet Take 1 tablet (100 mg total) by mouth daily. 02/25/23   Philip Aspen, Limmie Patricia, MD  mesalamine (LIALDA) 1.2 g EC tablet Take 4 tablets (4.8 g total) by mouth daily with breakfast. 11/21/22   Hilarie Fredrickson, MD  Multiple Vitamin (MULTIVITAMIN WITH MINERALS) TABS tablet Take 1 tablet by mouth daily.    [provider]  omeprazole (PRILOSEC) 40 MG capsule Take 1 capsule (40 mg total) by mouth daily before breakfast. TAKE 1 CAPSULE (40 MG TOTAL) BY MOUTH IN THE MORNING, AT NOON, AND AT BEDTIME. 11/21/22   Hilarie Fredrickson, MD  ondansetron (ZOFRAN) 4 MG tablet Take  1 tablet (4 mg total) by mouth every 8 (eight) hours as needed for nausea or vomiting. 11/05/22   Amin, Ankit C, MD  oxycodone (OXY-IR) 5 MG capsule Take 1 capsule (5 mg total) by mouth every 6 (six) hours as needed for pain. 11/05/22   Miguel Rota, MD  oxyCODONE-acetaminophen (PERCOCET) 5-325 MG tablet Take 1 tablet by mouth every 4 (four) hours as needed for severe pain (pain score 7-10). 01/31/23 01/31/24  Stechschulte, Hyman Hopes, MD  valACYclovir (VALTREX) 1000 MG tablet TAKE 1 TABLET (1,000 MG TOTAL) BY MOUTH TWICE A DAY AS NEEDED 12/30/22   Philip Aspen, Limmie Patricia, MD       Allergies    Adhesive [tape], Aspirin, Ciprofloxacin, Codeine, Demerol [meperidine hcl], Fentanyl, Lactose intolerance (gi), Latex, Other, Prednisone, and Septra [sulfamethoxazole-trimethoprim]    Review of Systems   Review of Systems  Gastrointestinal:  Positive for abdominal pain.    Physical Exam Updated Vital Signs BP (!) 131/107   Pulse 91   Temp 97.9 F (36.6 C)   Resp 18   SpO2 95%  Physical Exam Vitals and nursing note reviewed.  Constitutional:      General: She is not in acute distress.    Appearance: She is well-developed.  Cardiovascular:     Rate and Rhythm: Normal rate.     Heart sounds: No murmur heard. Pulmonary:     Effort: Pulmonary effort is normal.     Breath sounds: No wheezing, rhonchi or rales.  Abdominal:     General: Bowel sounds are decreased. There is distension.     Palpations: Abdomen is soft.    Musculoskeletal:        General: Normal range of motion.  Skin:    General: Skin is warm and dry.  Neurological:     Mental Status: She is alert and oriented to person, place, and time.     ED Results / Procedures / Treatments   Labs (all labs ordered are listed, but only abnormal results are displayed) Labs Reviewed  URINALYSIS, ROUTINE W REFLEX MICROSCOPIC - Abnormal; Notable for the following components:      Result Value   Ketones, ur TRACE (*)    Protein, ur 30 (*)    All other components within normal limits  COMPREHENSIVE METABOLIC PANEL WITH GFR - Abnormal; Notable for the following components:   Sodium 130 (*)    CO2 15 (*)    Glucose, Bld 118 (*)    BUN 68 (*)    Creatinine, Ser 3.10 (*)    GFR, Estimated 15 (*)    All other components within normal limits  CBC  LIPASE, BLOOD  TROPONIN I (HIGH SENSITIVITY)  TROPONIN I (HIGH SENSITIVITY)   Results for orders placed or performed during the hospital encounter of 04/16/23  CBC   Collection Time: 04/16/23  7:20 PM  Result Value Ref Range   WBC 10.0 4.0 - 10.5 K/uL   RBC  4.00 3.87 - 5.11 MIL/uL   Hemoglobin 12.5 12.0 - 15.0 g/dL   HCT 14.7 82.9 - 56.2 %   MCV 90.5 80.0 - 100.0 fL   MCH 31.3 26.0 - 34.0 pg   MCHC 34.5 30.0 - 36.0 g/dL   RDW 13.0 86.5 - 78.4 %   Platelets 369 150 - 400 K/uL   nRBC 0.0 0.0 - 0.2 %  Urinalysis, Routine w reflex microscopic -Urine, Clean Catch   Collection Time: 04/16/23  7:36 PM  Result Value Ref Range  Color, Urine YELLOW YELLOW   APPearance CLEAR CLEAR   Specific Gravity, Urine 1.019 1.005 - 1.030   pH 5.5 5.0 - 8.0   Glucose, UA NEGATIVE NEGATIVE mg/dL   Hgb urine dipstick NEGATIVE NEGATIVE   Bilirubin Urine NEGATIVE NEGATIVE   Ketones, ur TRACE (A) NEGATIVE mg/dL   Protein, ur 30 (A) NEGATIVE mg/dL   Nitrite NEGATIVE NEGATIVE   Leukocytes,Ua NEGATIVE NEGATIVE   RBC / HPF 0-5 0 - 5 RBC/hpf   WBC, UA 0-5 0 - 5 WBC/hpf   Bacteria, UA NONE SEEN NONE SEEN   Squamous Epithelial / HPF 0-5 0 - 5 /HPF   Mucus PRESENT    Hyaline Casts, UA PRESENT   Comprehensive metabolic panel with GFR   Collection Time: 04/16/23  8:30 PM  Result Value Ref Range   Sodium 130 (L) 135 - 145 mmol/L   Potassium 4.5 3.5 - 5.1 mmol/L   Chloride 103 98 - 111 mmol/L   CO2 15 (L) 22 - 32 mmol/L   Glucose, Bld 118 (H) 70 - 99 mg/dL   BUN 68 (H) 8 - 23 mg/dL   Creatinine, Ser 1.61 (H) 0.44 - 1.00 mg/dL   Calcium 8.9 8.9 - 09.6 mg/dL   Total Protein 7.2 6.5 - 8.1 g/dL   Albumin 3.9 3.5 - 5.0 g/dL   AST 18 15 - 41 U/L   ALT 12 0 - 44 U/L   Alkaline Phosphatase 83 38 - 126 U/L   Total Bilirubin 0.5 0.0 - 1.2 mg/dL   GFR, Estimated 15 (L) >60 mL/min   Anion gap 12 5 - 15  Lipase, blood   Collection Time: 04/16/23  8:30 PM  Result Value Ref Range   Lipase 17 11 - 51 U/L  Troponin I (High Sensitivity)   Collection Time: 04/16/23  8:30 PM  Result Value Ref Range   Troponin I (High Sensitivity) 4 <18 ng/L     EKG EKG Interpretation Date/Time:  Wednesday April 16 2023 19:28:21 EDT Ventricular Rate:  76 PR Interval:  196 QRS  Duration:  100 QT Interval:  372 QTC Calculation: 418 R Axis:   20  Text Interpretation: Normal sinus rhythm Left ventricular hypertrophy with repolarization abnormality ( R in aVL , Cornell product , Romhilt-Estes ) Abnormal ECG new flipped t eaves in inferior and lateral leads not seen on prior Confirmed by Melene Plan [phone]) on 04/17/2023 12:08:47 AM  Radiology DG Chest 2 View Result Date: 04/16/2023 CLINICAL DATA:  Chest pain.  Hiatal hernia surgery in January. EXAM: CHEST - 2 VIEW COMPARISON:  05/02/2021 FINDINGS: Previous hiatal hernia is no longer seen. Heart size difficult to assess due to scoliosis, likely normal. Minor scarring at both lung bases. No confluent airspace disease. No pleural effusion or pneumothorax. Prominent scoliotic curvature of the spine. IMPRESSION: 1. Minor bibasilar atelectasis/scarring. 2. Previous hiatal hernia is no longer seen. Electronically Signed   By: Narda Rutherford M.D.   On: 04/16/2023 22:07    Procedures Procedures    Medications Ordered in ED Medications  HYDROmorphone (DILAUDID) injection 0.5 mg (0.5 mg Intravenous Given 04/16/23 2326)  sodium chloride 0.9 % bolus 1,000 mL (1,000 mLs Intravenous New Bag/Given 04/17/23 0015)    ED Course/ Medical Decision Making/ A&P                                 Medical Decision Making This patient presents to the ED for  concern of abdominal pain, this involves an extensive number of treatment options, and is a complaint that carries with it a high risk of complications and morbidity.  The differential diagnosis includes obstruction, crohn's flare, perforation, diverticulitis, ischemic bowel   Co morbidities that complicate the patient evaluation  Recent nissen fundoplication (01/2023), h/o Crohns, CKD, HTN, GERD, scoliosis,    Additional history obtained:  Additional history and/or information obtained from chart review, notable for surgical notes Nissen procedure 01/31/2023; GI follow up Marina Goodell)  02/20/23   Lab Tests:  I Ordered, and personally interpreted labs.  The pertinent results include:  W    Imaging Studies ordered:  I ordered imaging studies including CT abd/pel Pending at time of sign out at end of shift.   Cardiac Monitoring:  The patient was maintained on a cardiac monitor.  I personally viewed and interpreted the cardiac monitored which showed an underlying rhythm of: NSR; new inverted T-waves inferior and lateral leads   Medicines ordered and prescription drug management:  I ordered medication including Dilaudid  for pain Reevaluation of the patient after these medicines showed that the patient improved I have reviewed the patients home medicines and have made adjustments as needed   Test Considered:  N/a   Critical Interventions:  N/a   Consultations Obtained:  I requested consultation with the n/a,  and discussed lab and imaging findings as well as pertinent plan - they recommend: n/a   Problem List / ED Course:  Abdominal pain, decreased bowel movements, less gas Recent nissen fundoplication, with coughing since surgery  Labs c/w AKI, no leukocytosis CT abd/pel pending at time of sign out to oncoming provider (Dr. Adela Lank)   Reevaluation:  After the interventions noted above, I reevaluated the patient and found that they have :improved   Social Determinants of Health:  Lives with husband   Disposition:  After consideration of the diagnostic results and the patients response to treatment, I feel that the patient would benefit from: Disposition pending at time of sign out to Dr. Adela Lank.   Amount and/or Complexity of Data Reviewed Labs: ordered. Radiology: ordered.  Risk Prescription drug management.           Final Clinical Impression(s) / ED Diagnoses Final diagnoses:  AKI (acute kidney injury) (HCC)  Periumbilical abdominal pain    Rx / DC Orders ED Discharge Orders     None         Elpidio Anis,  PA-C 04/17/23 0048    Melene Plan, DO 04/17/23 [phone]

## 2023-04-16 NOTE — ED Triage Notes (Signed)
 Hiatal Surg in January. Coughing- assumed from seasonal allergies-causing pain across whole abdomen. Afebrile.

## 2023-04-16 NOTE — ED Provider Notes (Incomplete)
  EMERGENCY DEPARTMENT AT [facility] Provider Note   CSN: [id number] Arrival date & time: 04/16/23  1908     History {Add pertinent medical, surgical, social history, OB history to HPI:1} Chief Complaint  Patient presents with  . Abdominal Pain    [name] is a 78 y.o. female.  Patient to ED for evaluation of abdominal pain that started yesterday. History of Crohn's, history of Nissan Fundoplication in January of this year. No fever, nausea. She usually has multiple bowel movements daily but has not had a bowel movement or passed gas since yesterday. She feels bloated. She has had a cough for several days that has been forceful enough to cause concern for injury of the recently repaired hiatal hernia.   The history is provided by the patient and the spouse. No language interpreter was used.  Abdominal Pain      Home Medications Prior to Admission medications   Medication Sig Start Date End Date Taking? Authorizing Provider  albuterol (PROVENTIL) (2.5 MG/3ML) 0.083% nebulizer solution Take 3 mLs (2.5 mg total) by nebulization every 6 (six) hours as needed for wheezing or shortness of breath. 05/27/22   Cobb, Ruby Cola, NP  albuterol (VENTOLIN HFA) 108 (90 Base) MCG/ACT inhaler Inhale 2 puffs into the lungs every 6 (six) hours as needed for wheezing or shortness of breath. 05/27/22   Cobb, Ruby Cola, NP  Budeson-Glycopyrrol-Formoterol (BREZTRI AEROSPHERE) 160-9-4.8 MCG/ACT AERO Inhale 2 puffs into the lungs in the morning and at bedtime. 01/02/23   Martina Sinner, MD  budesonide-formoterol (SYMBICORT) 160-4.5 MCG/ACT inhaler Inhale 2 puffs into the lungs in the morning and at bedtime. 05/27/22   Cobb, Ruby Cola, NP  Calcium Citrate-Vitamin D (CALCIUM + D PO) Take 1 tablet by mouth 2 (two) times daily.    [provider]  cetirizine (ZYRTEC) 10 MG tablet Take 1 tablet (10 mg total) by mouth daily. 02/25/23   Philip Aspen, Limmie Patricia, MD   colestipol (COLESTID) 1 g tablet Take 2 tablets (2 g total) by mouth 2 (two) times daily. 11/21/22   Hilarie Fredrickson, MD  dicyclomine (BENTYL) 20 MG tablet Take 1 tablet (20 mg total) by mouth 4 (four) times daily as needed for spasms. 11/21/22   Hilarie Fredrickson, MD  diphenoxylate-atropine (LOMOTIL) 2.5-0.025 MG tablet TAKE 2 TABLETS IN THE MORNING, AT [facility] AND AT BEDTIME AS DIRECTED 11/21/22   Hilarie Fredrickson, MD  DULoxetine (CYMBALTA) 60 MG capsule Take 1 capsule (60 mg total) by mouth 2 (two) times daily. 02/25/23   Philip Aspen, Limmie Patricia, MD  fluticasone [facility]) 50 MCG/ACT nasal spray SPRAY 2 SPRAYS INTO EACH NOSTRIL EVERY DAY 04/14/23   Philip Aspen, Limmie Patricia, MD  furosemide (LASIX) 20 MG tablet TAKE 1 TABLET (20 MG TOTAL) BY MOUTH DAILY AS NEEDED FOR EDEMA. 04/14/23   Philip Aspen, Limmie Patricia, MD  losartan (COZAAR) 100 MG tablet Take 1 tablet (100 mg total) by mouth daily. 02/25/23   Philip Aspen, Limmie Patricia, MD  mesalamine (LIALDA) 1.2 g EC tablet Take 4 tablets (4.8 g total) by mouth daily with breakfast. 11/21/22   Hilarie Fredrickson, MD  Multiple Vitamin (MULTIVITAMIN WITH MINERALS) TABS tablet Take 1 tablet by mouth daily.    [provider]  omeprazole (PRILOSEC) 40 MG capsule Take 1 capsule (40 mg total) by mouth daily before breakfast. TAKE 1 CAPSULE (40 MG TOTAL) BY MOUTH IN THE MORNING, AT NOON, AND AT BEDTIME. 11/21/22   Yancey Flemings  N, MD  ondansetron (ZOFRAN) 4 MG tablet Take 1 tablet (4 mg total) by mouth every 8 (eight) hours as needed for nausea or vomiting. 11/05/22   Amin, Ankit C, MD  oxycodone (OXY-IR) 5 MG capsule Take 1 capsule (5 mg total) by mouth every 6 (six) hours as needed for pain. 11/05/22   Miguel Rota, MD  oxyCODONE-acetaminophen (PERCOCET) 5-325 MG tablet Take 1 tablet by mouth every 4 (four) hours as needed for severe pain (pain score 7-10). 01/31/23 01/31/24  Stechschulte, Hyman Hopes, MD  valACYclovir (VALTREX) 1000 MG tablet TAKE 1 TABLET (1,000 MG TOTAL) BY  MOUTH TWICE A DAY AS NEEDED 12/30/22   Philip Aspen, Limmie Patricia, MD      Allergies    Adhesive [tape], Aspirin, Ciprofloxacin, Codeine, Demerol [meperidine hcl], Fentanyl, Lactose intolerance (gi), Latex, Other, Prednisone, and Septra [sulfamethoxazole-trimethoprim]    Review of Systems   Review of Systems  Gastrointestinal:  Positive for abdominal pain.    Physical Exam Updated Vital Signs BP (!) 143/93   Pulse 77   Temp 97.9 F (36.6 C)   Resp 18   SpO2 100%  Physical Exam Vitals and nursing note reviewed.  Constitutional:      General: She is not in acute distress.    Appearance: She is well-developed.  Cardiovascular:     Rate and Rhythm: Normal rate.     Heart sounds: No murmur heard. Pulmonary:     Effort: Pulmonary effort is normal.     Breath sounds: No wheezing, rhonchi or rales.  Abdominal:     General: Bowel sounds are decreased. There is distension.     Palpations: Abdomen is soft.    Musculoskeletal:        General: Normal range of motion.  Skin:    General: Skin is warm and dry.  Neurological:     Mental Status: She is alert and oriented to person, place, and time.     ED Results / Procedures / Treatments   Labs (all labs ordered are listed, but only abnormal results are displayed) Labs Reviewed  URINALYSIS, ROUTINE W REFLEX MICROSCOPIC - Abnormal; Notable for the following components:      Result Value   Ketones, ur TRACE (*)    Protein, ur 30 (*)    All other components within normal limits  COMPREHENSIVE METABOLIC PANEL WITH GFR - Abnormal; Notable for the following components:   Sodium 130 (*)    CO2 15 (*)    Glucose, Bld 118 (*)    BUN 68 (*)    Creatinine, Ser 3.10 (*)    GFR, Estimated 15 (*)    All other components within normal limits  CBC  LIPASE, BLOOD  TROPONIN I (HIGH SENSITIVITY)  TROPONIN I (HIGH SENSITIVITY)   Results for orders placed or performed during the hospital encounter of 04/16/23  CBC   Collection Time:  04/16/23  7:20 PM  Result Value Ref Range   WBC 10.0 4.0 - 10.5 K/uL   RBC 4.00 3.87 - 5.11 MIL/uL   Hemoglobin 12.5 12.0 - 15.0 g/dL   HCT 60.4 54.0 - 98.1 %   MCV 90.5 80.0 - 100.0 fL   MCH 31.3 26.0 - 34.0 pg   MCHC 34.5 30.0 - 36.0 g/dL   RDW 19.1 47.8 - 29.5 %   Platelets 369 150 - 400 K/uL   nRBC 0.0 0.0 - 0.2 %  Urinalysis, Routine w reflex microscopic -Urine, Clean Catch   Collection Time: 04/16/23  7:36 PM  Result Value Ref Range   Color, Urine YELLOW YELLOW   APPearance CLEAR CLEAR   Specific Gravity, Urine 1.019 1.005 - 1.030   pH 5.5 5.0 - 8.0   Glucose, UA NEGATIVE NEGATIVE mg/dL   Hgb urine dipstick NEGATIVE NEGATIVE   Bilirubin Urine NEGATIVE NEGATIVE   Ketones, ur TRACE (A) NEGATIVE mg/dL   Protein, ur 30 (A) NEGATIVE mg/dL   Nitrite NEGATIVE NEGATIVE   Leukocytes,Ua NEGATIVE NEGATIVE   RBC / HPF 0-5 0 - 5 RBC/hpf   WBC, UA 0-5 0 - 5 WBC/hpf   Bacteria, UA NONE SEEN NONE SEEN   Squamous Epithelial / HPF 0-5 0 - 5 /HPF   Mucus PRESENT    Hyaline Casts, UA PRESENT   Comprehensive metabolic panel with GFR   Collection Time: 04/16/23  8:30 PM  Result Value Ref Range   Sodium 130 (L) 135 - 145 mmol/L   Potassium 4.5 3.5 - 5.1 mmol/L   Chloride 103 98 - 111 mmol/L   CO2 15 (L) 22 - 32 mmol/L   Glucose, Bld 118 (H) 70 - 99 mg/dL   BUN 68 (H) 8 - 23 mg/dL   Creatinine, Ser 4.09 (H) 0.44 - 1.00 mg/dL   Calcium 8.9 8.9 - 81.1 mg/dL   Total Protein 7.2 6.5 - 8.1 g/dL   Albumin 3.9 3.5 - 5.0 g/dL   AST 18 15 - 41 U/L   ALT 12 0 - 44 U/L   Alkaline Phosphatase 83 38 - 126 U/L   Total Bilirubin 0.5 0.0 - 1.2 mg/dL   GFR, Estimated 15 (L) >60 mL/min   Anion gap 12 5 - 15  Lipase, blood   Collection Time: 04/16/23  8:30 PM  Result Value Ref Range   Lipase 17 11 - 51 U/L  Troponin I (High Sensitivity)   Collection Time: 04/16/23  8:30 PM  Result Value Ref Range   Troponin I (High Sensitivity) 4 <18 ng/L     EKG None  Radiology DG Chest 2 View Result  Date: 04/16/2023 CLINICAL DATA:  Chest pain.  Hiatal hernia surgery in January. EXAM: CHEST - 2 VIEW COMPARISON:  05/02/2021 FINDINGS: Previous hiatal hernia is no longer seen. Heart size difficult to assess due to scoliosis, likely normal. Minor scarring at both lung bases. No confluent airspace disease. No pleural effusion or pneumothorax. Prominent scoliotic curvature of the spine. IMPRESSION: 1. Minor bibasilar atelectasis/scarring. 2. Previous hiatal hernia is no longer seen. Electronically Signed   By: Narda Rutherford M.D.   On: 04/16/2023 22:07    Procedures Procedures  {Document cardiac monitor, telemetry assessment procedure when appropriate:1}  Medications Ordered in ED Medications  HYDROmorphone (DILAUDID) injection 0.5 mg (has no administration in time range)    ED Course/ Medical Decision Making/ A&P   {   Click here for ABCD2, HEART and other calculatorsREFRESH Note before signing :1}                              Medical Decision Making This patient presents to the ED for concern of abdominal pain, this involves an extensive number of treatment options, and is a complaint that carries with it a high risk of complications and morbidity.  The differential diagnosis includes obstruction, crohn's flare, perforation, diverticulitis, ischemic bowel   Co morbidities that complicate the patient evaluation  Recent nissen fundoplication (01/2023), h/o Crohns, CKD, HTN, GERD, scoliosis,    Additional history  obtained:  Additional history and/or information obtained from chart review, notable for surgical notes Nissen procedure 01/31/2023; GI follow up Marina Goodell) 02/20/23   Lab Tests:  I Ordered, and personally interpreted labs.  The pertinent results include:  W    Imaging Studies ordered:  I ordered imaging studies including *** I independently visualized and interpreted imaging which showed *** I agree with the radiologist interpretation   Cardiac Monitoring:  The  patient was maintained on a cardiac monitor.  I personally viewed and interpreted the cardiac monitored which showed an underlying rhythm of: ***   Medicines ordered and prescription drug management:  I ordered medication including ***  for *** Reevaluation of the patient after these medicines showed that the patient {resolved/improved/worsened:23923::"improved"} I have reviewed the patients home medicines and have made adjustments as needed   Test Considered:  ***   Critical Interventions:  ***   Consultations Obtained:  I requested consultation with the ***,  and discussed lab and imaging findings as well as pertinent plan - they recommend: ***   Problem List / ED Course:  ***   Reevaluation:  After the interventions noted above, I reevaluated the patient and found that they have :{resolved/improved/worsened:23923::"improved"}   Social Determinants of Health:  ***   Disposition:  After consideration of the diagnostic results and the patients response to treatment, I feel that the patient would benefit from ***.   Amount and/or Complexity of Data Reviewed Labs: ordered. Radiology: ordered.  Risk Prescription drug management.   ***  {Document critical care time when appropriate:1} {Document review of labs and clinical decision tools ie heart score, Chads2Vasc2 etc:1}  {Document your independent review of radiology images, and any outside records:1} {Document your discussion with family members, caretakers, and with consultants:1} {Document social determinants of health affecting pt's care:1} {Document your decision making why or why not admission, treatments were needed:1} Final Clinical Impression(s) / ED Diagnoses Final diagnoses:  None    Rx / DC Orders ED Discharge Orders     None

## 2023-04-17 ENCOUNTER — Telehealth: Payer: Self-pay

## 2023-04-17 DIAGNOSIS — R1033 Periumbilical pain: Secondary | ICD-10-CM | POA: Diagnosis not present

## 2023-04-17 MED ORDER — ONDANSETRON 4 MG PO TBDP: ORAL_TABLET | ORAL | 0 refills | Status: DC

## 2023-04-17 MED ORDER — PREDNISONE 20 MG PO TABS: ORAL_TABLET | ORAL | 0 refills | Status: DC

## 2023-04-17 MED ORDER — PREDNISONE 50 MG PO TABS
60.0000 mg | ORAL_TABLET | Freq: Once | ORAL | Status: AC
  Administered 2023-04-17: 60 mg via ORAL
  Filled 2023-04-17: qty 1

## 2023-04-17 MED ORDER — SODIUM CHLORIDE 0.9 % IV BOLUS
1000.0000 mL | Freq: Once | INTRAVENOUS | Status: AC
  Administered 2023-04-17: 1000 mL via INTRAVENOUS

## 2023-04-17 NOTE — Telephone Encounter (Signed)
-----   Message from Yancey Flemings sent at 04/17/2023  8:28 AM EDT ----- Thanks Gae Bon.  Bonita Quin, This patient is complicated in many respects. I would be surprised if she agrees to taking prednisone.  Tells me that she did not tolerate this previously.  If she agreed to prednisone great.  If not, I would recommend budesonide 9 mg daily. Get her in to see one of the advanced practitioners in the next week. Thanks, JP ----- Message ----- From: Shellia Cleverly, DO Sent: 04/17/2023   3:10 AM EDT To: Hilarie Fredrickson, MD  Received a call from the ER on this patient.  She came in with RLQ pain.  HD stable CT showing increase stool burden throughout the colon with dilation and fecalization of the TI with GI wall thickening and wall thickening of the transverse colon, all suspicious for Crohn's flare.  Does have AKI with BUN/creatinine 68/3.1.  Otherwise normal CBC.  Her pain was actually resolved in the ER, and per ER doc patient was getting ready to at home.  He gave her prednisone and was requesting expedited outpatient follow-up.  Hopefully she stays long enough that he can at least check ESR/CRP.  Can you please have your team give a call in the morning to check in on her and schedule expedited OV.  Thanks.

## 2023-04-17 NOTE — Telephone Encounter (Signed)
 Left message on pts voicemail that she should be taking the prednisone but if she was not to contact the office and we would send in some budesonide. Pt scheduled to see Alcide Evener NP 04/28/23 at 1:30pm. Left detailed message on pts voicemail.

## 2023-04-17 NOTE — Discharge Instructions (Signed)
Follow up with your GI doc in the office.

## 2023-04-22 ENCOUNTER — Ambulatory Visit: Payer: Medicare Other | Admitting: Family Medicine

## 2023-04-22 DIAGNOSIS — Z0000 Encounter for general adult medical examination without abnormal findings: Secondary | ICD-10-CM

## 2023-04-22 NOTE — Patient Instructions (Signed)
 I really enjoyed getting to talk with you today! I am available on Tuesdays and Thursdays for virtual visits if you have any questions or concerns, or if I can be of any further assistance.   CHECKLIST FROM ANNUAL WELLNESS VISIT:  -Follow up (please call to schedule if not scheduled after visit):   -yearly for annual wellness visit with primary care office  Here is a list of your preventive care/health maintenance measures and the plan for each if any are due:  PLAN For any measures below that may be due:  -if you wish to get the vaccine, you may do so at the pharmacy, please let us know if you do so that we can update your record -can request hepatitis c screening when you get labs next if you wish to do it  Health Maintenance  Topic Date Due   Hepatitis C Screening  Never done   Zoster Vaccines- Shingrix (1 of 2) Never done   COVID-19 Vaccine (6 - 2024-25 season) 11/25/2022   DEXA SCAN  04/21/2024 (Originally 06/24/2010)   INFLUENZA VACCINE  08/08/2023   Medicare Annual Wellness (AWV)  04/21/2024   DTaP/Tdap/Td (2 - Td or Tdap) 09/14/2031   Pneumonia Vaccine 54+ Years old  Completed   HPV VACCINES  Aged Out   Meningococcal B Vaccine  Aged Out   Colonoscopy  Discontinued    -See a dentist at least yearly  -Get your eyes checked and then per your eye specialist's recommendations  -Other issues addressed today:   -I have included below further information regarding a healthy whole foods based diet, physical activity guidelines for adults, stress management and opportunities for social connections. I hope you find this information useful.   -----------------------------------------------------------------------------------------------------------------------------------------------------------------------------------------------------------------------------------------------------------    NUTRITION: -eat real food: lots of colorful vegetables (half the plate) and  fruits -5-7 servings of vegetables and fruits per day (fresh or steamed is best), exp. 2 servings of vegetables with lunch and dinner and 2 servings of fruit per day. Berries and greens such as kale and collards are great choices.  -consume on a regular basis:  fresh fruits, fresh veggies, fish, nuts, seeds, healthy oils (such as olive oil, avocado oil), whole grains (make sure for bread/pasta/crackers/etc., that the first ingredient on label contains the word "whole"), legumes. -can eat small amounts of dairy and lean meat (no larger than the palm of your hand), but avoid processed meats such as ham, bacon, lunch meat, etc. -drink water -try to avoid fast food and pre-packaged foods, processed meat, ultra processed foods/beverages (donuts, candy, etc.) -most experts advise limiting sodium to < 2300mg  per day, should limit further is any chronic conditions such as high blood pressure, heart disease, diabetes, etc. The American Heart Association advised that < 1500mg  is is ideal -try to avoid foods/beverages that contain any ingredients with names you do not recognize  -try to avoid foods/beverages  with added sugar or sweeteners/sweets  -try to avoid sweet drinks (including diet drinks): soda, juice, Gatorade, sweet tea, power drinks, diet drinks -try to avoid white rice, white bread, pasta (unless whole grain)  EXERCISE GUIDELINES FOR ADULTS: -if you wish to increase your physical activity, do so gradually and with the approval of your doctor -STOP and seek medical care immediately if you have any chest pain, chest discomfort or trouble breathing when starting or increasing exercise  -move and stretch your body, legs, feet and arms when sitting for long periods -Physical activity guidelines for optimal health in adults: -get at  least 150 minutes per week of moderate exercise (can talk, but not sing); this is about 20-30 minutes of sustained activity 5-7 days per week or two 10-15 minute episodes of  sustained activity 5-7 days per week -do some muscle building/resistance training/strength training at least 2 days per week  -balance exercises 3+ days per week:   Stand somewhere where you have something sturdy to hold onto if you lose balance    1) lift up on toes, then back down, start with 5x per day and work up to 20x   2) stand and lift one leg straight out to the side so that foot is a few inches of the floor, start with 5x each side and work up to 20x each side   3) stand on one foot, start with 5 seconds each side and work up to 20 seconds on each side  If you need ideas or help with getting more active:  -Silver sneakers https://tools.silversneakers.com  -Walk with a Doc: http://www.duncan-williams.com/  -try to include resistance (weight lifting/strength building) and balance exercises twice per week: or the following link for ideas: http://castillo-powell.com/  BuyDucts.dk  STRESS MANAGEMENT: -can try meditating, or just sitting quietly with deep breathing while intentionally relaxing all parts of your body for 5 minutes daily -if you need further help with stress, anxiety or depression please follow up with your primary doctor or contact the wonderful folks at [facility]: [phone]  SOCIAL CONNECTIONS: -options in [place] if you wish to engage in more social and exercise related activities:  -Silver sneakers https://tools.silversneakers.com  -Walk with a Doc: http://www.duncan-williams.com/  -Check out the [facility] Active Adults 50+ section on the [place] of [employer] (hiking clubs, book clubs, cards and games, chess, exercise classes, aquatic classes and much more) - see the website for details: https://www.[place].gov/departments/parks-recreation/active-adults50  -YouTube has lots of exercise videos for different ages and abilities as well  -[facility] (a variety of indoor and outdoor inperson activities for adults). [phone]. [address]  -Virtual Online Classes (a variety of topics): see seniorplanet.org or call [phone]  -consider volunteering at a school, hospice center, church, senior center or elsewhere

## 2023-04-22 NOTE — Progress Notes (Signed)
 PATIENT CHECK-IN and HEALTH RISK ASSESSMENT QUESTIONNAIRE:  -completed by phone/video for upcoming Medicare Preventive Visit  -PLEASE SELECT "NOT IN PERSON" for the method of visit.    Pre-Visit Check-in: 1)Vitals (height, wt, BP, etc) - record in vitals section for visit on day of visit Request home vitals (wt, BP, etc.) and enter into vitals, THEN update Vital Signs SmartPhrase below at the top of the HPI. See below.  2)Review and Update Medications, Allergies PMH, Surgeries, Social history in Epic 3)Hospitalizations in the last year with date/reason?  Jan, hiatal hernia surgery 4)Review and Update Care Team (patient's specialists) in Epic 5) Complete PHQ9 in Epic  6) Complete Fall Screening in Epic 7)Review all Health Maintenance Due and order under PCP if not done.  Medicare Wellness Patient Questionnaire:  Answer theses question about your habits: How often do you have a drink containing alcohol?no How many drinks containing alcohol do you have on a typical day when you are drinking?na How often do you have six or more drinks on one occasion?na Have you ever smoked? Yes, quit in 1990 Do you use an illicit drugs?n On average, how many days per week do you engage in moderate to strenuous exercise (like a brisk walk)?n On average, how many minutes do you engage in exercise at this level?na Typical diet: cream cheese, bagel and smoked salmon, grilled cheese, chicken salad sandwiches  Beverages: water  Answer theses question about your everyday activities: Can you perform most household chores?y Are you deaf or have significant trouble hearing? Has hearing aids Do you feel that you have a problem with memory?n Do you feel safe at home? y Last dentist visit? Goes once or twice a year 8. Do you have any difficulty performing your everyday activities?n Are you having any difficulty walking, taking medications on your own, and or difficulty managing daily home needs?n Do you have  difficulty walking or climbing stairs?n Do you have difficulty dressing or bathing?n Do you have difficulty doing errands alone such as visiting a doctor's office or shopping?n Do you currently have any difficulty preparing food and eating?n Do you currently have any difficulty using the toilet?n Do you have any difficulty managing your finances?n Do you have any difficulties with housekeeping of managing your housekeeping?n   Do you have Advanced Directives in place (Living Will, Healthcare Power or Attorney)? y   Last eye Exam and location? Goes yearly   Do you currently use prescribed or non-prescribed narcotic or opioid pain medications?n - used after hiatal hernia surgery  Do you have a history or close family history of breast, ovarian, tubal or peritoneal cancer or a family member with BRCA (breast cancer susceptibility 1 and 2) gene mutations? n     ----------------------------------------------------------------------------------------------------------------------------------------------------------------------------------------------------------------------  Because this visit was a virtual/telehealth visit, some criteria may be missing or patient reported. Any vitals not documented were not able to be obtained and vitals that have been documented are patient reported.    MEDICARE ANNUAL PREVENTIVE VISIT WITH PROVIDER: (Welcome to Medicare, initial annual wellness or annual wellness exam)  Virtual Visit via Video Note  I connected with [name] on 04/22/23 by a video enabled telemedicine application and verified that I am speaking with the correct person using two identifiers.  Location patient: home Location provider:work or home office Persons participating in the virtual visit: patient, provider  Concerns and/or follow up today: currently dealing with active flare of Crohn's disease. Seeing PA next week. Doing a little better the last 3  days, was able to have  BMs without pain.    See HM section in Epic for other details of completed HM.    ROS: negative for report of fevers, unintentional weight loss, vision changes, vision loss, hearing loss or change, chest pain, sob, hemoptysis, melena, hematochezia, hematuria, falls, bleeding or bruising, thoughts of suicide or self harm, memory loss  Patient-completed extensive health risk assessment - reviewed and discussed with the patient: See Health Risk Assessment completed with patient prior to the visit either above or in recent phone note. This was reviewed in detailed with the patient today and appropriate recommendations, orders and referrals were placed as needed per Summary below and patient instructions.   Review of Medical History: -PMH, PSH, Family History and current specialty and care providers reviewed and updated and listed below   Patient Care Team: Zilphia Hilt, Charyl Coppersmith, MD as PCP - General (Internal Medicine) Alver Austin, [facility] (Inactive) as Pharmacist (Pharmacist)   Past Medical History:  Diagnosis Date   Allergies    Anemia    Bruises easily    Complication of anesthesia    Crohn's disease (HCC)    GI Dr Joye Nobles in [place], [place]     Deviated septum    GERD (gastroesophageal reflux disease)    History of hiatal hernia    HTN (hypertension)    pcp Dr Ramiro Burly  in [place], [place]     Kidney disease, chronic, stage III (GFR 30-59 ml/min) (HCC)    Neuropathy    fingers,periodically   Osteoarthritis    Pancreatic insufficiency    PONV (postoperative nausea and vomiting)    Scoliosis    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   Whooping cough 2015   Whooping cough with pneumonia    2017    Past Surgical History:  Procedure Laterality Date   BIOPSY  11/05/2022   Procedure: BIOPSY;  Surgeon: Nannette Babe, MD;  Location: [facility] ENDOSCOPY;  Service: Gastroenterology;;   CATARACT EXTRACTION, BILATERAL  2018   with IOC implant    COLONOSCOPY      DEBRIDEMENT AND CLOSURE WOUND Right 10/01/2018   Procedure: Excision of right knee wound with placement of ACell and Pravena;  Surgeon: Thornell Flirt, DO;  Location: [facility];  Service: Plastics;  Laterality: Right;  30 min   ESOPHAGOGASTRODUODENOSCOPY (EGD) WITH PROPOFOL N/A 11/05/2022   Procedure: ESOPHAGOGASTRODUODENOSCOPY (EGD) WITH PROPOFOL;  Surgeon: Nannette Babe, MD;  Location: [facility] ENDOSCOPY;  Service: Gastroenterology;  Laterality: N/A;   HERNIA REPAIR     knee fracture  Right    NASAL FRACTURE SURGERY     needed b/c she was getting frequent sinus infections    TONSILLECTOMY     TOTAL KNEE ARTHROPLASTY Right 07/28/2018   Procedure: TOTAL KNEE ARTHROPLASTY;  Surgeon: Claiborne Crew, MD;  Location: [facility]S;  Service: Orthopedics;  Laterality: Right;   TOTAL KNEE ARTHROPLASTY Left 03/02/2019   Procedure: TOTAL KNEE ARTHROPLASTY;  Surgeon: Claiborne Crew, MD;  Location: [facility];  Service: Orthopedics;  Laterality: Left;  70 mins   UPPER GASTROINTESTINAL ENDOSCOPY     XI ROBOTIC ASSISTED HIATAL HERNIA REPAIR N/A 01/31/2023   Procedure: XI ROBOTIC ASSISTED HIATAL HERNIA REPAIR WITH MESH;  Surgeon: Stechschulte, Avon Boers, MD;  Location: [facility];  Service: General;  Laterality: N/A;    Social History   Socioeconomic History   Marital status: Married    Spouse name: Not on file   Number of  children: 3   Years of education: Not on file   Highest education level: Not on file  Occupational History   Not on file  Tobacco Use   Smoking status: Former    Current packs/day: 0.00    Average packs/day: 1 pack/day for 30.0 years (30.0 ttl pk-yrs)    Types: Cigarettes    Start date: 60    Quit date: 46    Years since quitting: 35.3   Smokeless tobacco: Never  Vaping Use   Vaping status: Never Used  Substance and Sexual Activity   Alcohol use: Not Currently   Drug use: Never   Sexual activity: Not Currently    Birth control/protection: Post-menopausal  Other  Topics Concern   Not on file  Social History Narrative   Not on file   Social Drivers of Health   Financial Resource Strain: Low Risk  (04/16/2022)   Overall Financial Resource Strain (CARDIA)    Difficulty of Paying Living Expenses: Not hard at all  Food Insecurity: Patient Declined (02/03/2023)   Hunger Vital Sign    Worried About Running Out of Food in the Last Year: Patient declined    Ran Out of Food in the Last Year: Patient declined  Transportation Needs: Patient Declined (02/03/2023)   PRAPARE - Administrator, Civil Service (Medical): Patient declined    Lack of Transportation (Non-Medical): Patient declined  Physical Activity: Insufficiently Active (04/16/2022)   Exercise Vital Sign    Days of Exercise per Week: 2 days    Minutes of Exercise per Session: 20 min  Stress: No Stress Concern Present (04/16/2022)   [employer] of Occupational Health - Occupational Stress Questionnaire    Feeling of Stress : Not at all  Social Connections: Patient Declined (02/03/2023)   Social Connection and Isolation Panel [NHANES]    Frequency of Communication with Friends and Family: Patient declined    Frequency of Social Gatherings with Friends and Family: Patient declined    Attends Religious Services: Patient declined    Database administrator or Organizations: Patient declined    Attends Banker Meetings: Patient declined    Marital Status: Patient declined  Recent Concern: Social Connections - Moderately Isolated (01/31/2023)   Social Connection and Isolation Panel [NHANES]    Frequency of Communication with Friends and Family: More than three times a week    Frequency of Social Gatherings with Friends and Family: Once a week    Attends Religious Services: Never    Database administrator or Organizations: No    Attends Banker Meetings: Never    Marital Status: Married  Catering manager Violence: Patient Declined (02/03/2023)   Humiliation,  Afraid, Rape, and Kick questionnaire    Fear of Current or Ex-Partner: Patient declined    Emotionally Abused: Patient declined    Physically Abused: Patient declined    Sexually Abused: Patient declined    Family History  Problem Relation Age of Onset   Diabetes Father    Parkinson's disease Father    Colon cancer Other    Asthma Neg Hx    Esophageal cancer Neg Hx    Pancreatic cancer Neg Hx    Liver disease Neg Hx    Stomach cancer Neg Hx     Current Outpatient Medications on File Prior to Visit  Medication Sig Dispense Refill   albuterol (PROVENTIL) (2.5 MG/3ML) 0.083% nebulizer solution Take 3 mLs (2.5 mg total) by nebulization every 6 (six) hours as  needed for wheezing or shortness of breath. 75 mL 5   albuterol (VENTOLIN HFA) 108 (90 Base) MCG/ACT inhaler Inhale 2 puffs into the lungs every 6 (six) hours as needed for wheezing or shortness of breath. 8 g 6   Budeson-Glycopyrrol-Formoterol (BREZTRI AEROSPHERE) 160-9-4.8 MCG/ACT AERO Inhale 2 puffs into the lungs in the morning and at bedtime.     budesonide-formoterol (SYMBICORT) 160-4.5 MCG/ACT inhaler Inhale 2 puffs into the lungs in the morning and at bedtime. 1 each 12   Calcium Citrate-Vitamin D (CALCIUM + D PO) Take 1 tablet by mouth 2 (two) times daily.     cetirizine (ZYRTEC) 10 MG tablet Take 1 tablet (10 mg total) by mouth daily. 90 tablet 1   colestipol (COLESTID) 1 g tablet Take 2 tablets (2 g total) by mouth 2 (two) times daily. 180 tablet 3   dicyclomine (BENTYL) 20 MG tablet Take 1 tablet (20 mg total) by mouth 4 (four) times daily as needed for spasms. 360 tablet 3   diphenoxylate-atropine (LOMOTIL) 2.5-0.025 MG tablet TAKE 2 TABLETS IN THE MORNING, AT NOON AND AT BEDTIME AS DIRECTED 180 tablet 3   DULoxetine (CYMBALTA) 60 MG capsule Take 1 capsule (60 mg total) by mouth 2 (two) times daily. 180 capsule 1   fluticasone (FLONASE) 50 MCG/ACT nasal spray SPRAY 2 SPRAYS INTO EACH NOSTRIL EVERY DAY 48 mL 0   furosemide  (LASIX) 20 MG tablet TAKE 1 TABLET (20 MG TOTAL) BY MOUTH DAILY AS NEEDED FOR EDEMA. 90 tablet 0   losartan (COZAAR) 100 MG tablet Take 1 tablet (100 mg total) by mouth daily. 90 tablet 1   mesalamine (LIALDA) 1.2 g EC tablet Take 4 tablets (4.8 g total) by mouth daily with breakfast. 360 tablet 3   Multiple Vitamin (MULTIVITAMIN WITH MINERALS) TABS tablet Take 1 tablet by mouth daily.     omeprazole (PRILOSEC) 40 MG capsule Take 1 capsule (40 mg total) by mouth daily before breakfast. TAKE 1 CAPSULE (40 MG TOTAL) BY MOUTH IN THE MORNING, AT NOON, AND AT BEDTIME. 90 capsule 3   ondansetron (ZOFRAN) 4 MG tablet Take 1 tablet (4 mg total) by mouth every 8 (eight) hours as needed for nausea or vomiting. 90 tablet 0   ondansetron (ZOFRAN-ODT) 4 MG disintegrating tablet 4mg  ODT q4 hours prn nausea/vomit 20 tablet 0   oxycodone (OXY-IR) 5 MG capsule Take 1 capsule (5 mg total) by mouth every 6 (six) hours as needed for pain. 30 capsule 0   oxyCODONE-acetaminophen (PERCOCET) 5-325 MG tablet Take 1 tablet by mouth every 4 (four) hours as needed for severe pain (pain score 7-10). 20 tablet 0   predniSONE (DELTASONE) 20 MG tablet 2 tabs po daily x 4 days 8 tablet 0   valACYclovir (VALTREX) 1000 MG tablet TAKE 1 TABLET (1,000 MG TOTAL) BY MOUTH TWICE A DAY AS NEEDED 90 tablet 1   No current facility-administered medications on file prior to visit.    Allergies  Allergen Reactions   Adhesive [Tape]     Redness and skin peeling   Aspirin     Intestinal Bleeding   Ciprofloxacin Nausea And Vomiting   Codeine Nausea And Vomiting   Demerol [Meperidine Hcl] Nausea And Vomiting   Fentanyl Nausea And Vomiting    Makes me very sick   Lactose Intolerance (Gi) Other (See Comments)    Pt has Crohn's    Latex     Redness and skin peeling   Other Itching    Nuts-itching in  throat  Seeds-stomach issues with chrons  Pt has emphysema    Prednisone Other (See Comments)    Upset stomach   Septra  [Sulfamethoxazole-Trimethoprim] Nausea Only       Physical Exam Vitals requested from patient and listed below if patient had equipment and was able to obtain at home for this virtual visit: There were no vitals filed for this visit. Estimated body mass index is 22.05 kg/m as calculated from the following:   Height as of 02/20/23: 5' (1.524 m).   Weight as of 03/19/23: 112 lb 14.4 oz (51.2 kg).  EKG (optional): deferred due to virtual visit  GENERAL: alert, oriented, no acute distress detected, full vision exam deferred due to pandemic and/or virtual encounter  HEENT: atraumatic, conjunttiva clear, no obvious abnormalities on inspection of external nose and ears  NECK: normal movements of the head and neck  LUNGS: on inspection no signs of respiratory distress, breathing rate appears normal, no obvious gross SOB, gasping or wheezing  CV: no obvious cyanosis  MS: moves all visible extremities without noticeable abnormality  PSYCH/NEURO: pleasant and cooperative, no obvious depression or anxiety, speech and thought processing grossly intact, Cognitive function grossly intact  Flowsheet Row Office Visit from 03/19/2023 in [facility]  PHQ-9 Total Score 0           04/22/2023   11:46 AM 03/19/2023    3:51 PM 09/10/2022    2:28 PM 04/17/2022   11:24 AM 04/16/2022    1:42 PM  Depression screen PHQ 2/9  Decreased Interest 0 0 0 0 0  Down, Depressed, Hopeless 0 0 0 0 0  PHQ - 2 Score 0 0 0 0 0  Altered sleeping  0 0 0   Tired, decreased energy  0 0 0   Change in appetite  0 0 0   Feeling bad or failure about yourself   0 0 0   Trouble concentrating  0 0 0   Moving slowly or fidgety/restless  0 0 0   Suicidal thoughts  0 0 0   PHQ-9 Score  0 0 0   Difficult doing work/chores   Not difficult at all Not difficult at all        04/17/2022   11:24 AM 09/10/2022    2:28 PM 01/02/2023    3:51 PM 03/19/2023    3:51 PM 04/22/2023   11:46 AM  Fall Risk   Falls in the past year? 1 0 0 0 0  Was there an injury with Fall? 1 0  0 0  Fall Risk Category Calculator 3 0  0 0  Patient at Risk for Falls Due to No Fall Risks      Fall risk Follow up Falls evaluation completed Falls evaluation completed  Falls evaluation completed Falls evaluation completed     SUMMARY AND PLAN:  Medicare annual wellness visit, subsequent  Discussed applicable health maintenance/preventive health measures and advised and referred or ordered per patient preferences: -she declined the bone density test -discussed vaccines due and other measures due Health Maintenance  Topic Date Due   Hepatitis C Screening  Never done   Zoster Vaccines- Shingrix (1 of 2) Never done   COVID-19 Vaccine (6 - 2024-25 season) 11/25/2022   DEXA SCAN  04/21/2024 (Originally 06/24/2010)   INFLUENZA VACCINE  08/08/2023   Medicare Annual Wellness (AWV)  04/21/2024   DTaP/Tdap/Td (2 - Td or Tdap) 09/14/2031   Pneumonia Vaccine 72+ Years old  Completed  HPV VACCINES  Aged Out   Meningococcal B Vaccine  Aged Out   Colonoscopy  Discontinued      Education and counseling on the following was provided based on the above review of health and a plan/checklist for the patient, along with additional information discussed, was provided for the patient in the patient instructions :   -Advised and counseled on a healthy lifestyle - including the importance of a healthy diet, regular physical activity,  -Reviewed patient's current diet. Advised and counseled on a whole foods based healthy diet. A summary of a healthy diet was provided in the Patient Instructions.  -reviewed patient's current physical activity level and discussed exercise guidelines for adults. Discussed community resources and ideas for safe exercise at home to assist in meeting exercise guideline recommendations in a safe and healthy way.  -Advise yearly dental visits at minimum and regular eye exams   Follow up: see patient  instructions     Patient Instructions  I really enjoyed getting to talk with you today! I am available on Tuesdays and Thursdays for virtual visits if you have any questions or concerns, or if I can be of any further assistance.   CHECKLIST FROM ANNUAL WELLNESS VISIT:  -Follow up (please call to schedule if not scheduled after visit):   -yearly for annual wellness visit with primary care office  Here is a list of your preventive care/health maintenance measures and the plan for each if any are due:  PLAN For any measures below that may be due:  -if you wish to get the vaccine, you may do so at the pharmacy, please let us know if you do so that we can update your record -can request hepatitis c screening when you get labs next if you wish to do it  Health Maintenance  Topic Date Due   Hepatitis C Screening  Never done   Zoster Vaccines- Shingrix (1 of 2) Never done   COVID-19 Vaccine (6 - 2024-25 season) 11/25/2022   DEXA SCAN  04/21/2024 (Originally 06/24/2010)   INFLUENZA VACCINE  08/08/2023   Medicare Annual Wellness (AWV)  04/21/2024   DTaP/Tdap/Td (2 - Td or Tdap) 09/14/2031   Pneumonia Vaccine 57+ Years old  Completed   HPV VACCINES  Aged Out   Meningococcal B Vaccine  Aged Out   Colonoscopy  Discontinued    -See a dentist at least yearly  -Get your eyes checked and then per your eye specialist's recommendations  -Other issues addressed today:   -I have included below further information regarding a healthy whole foods based diet, physical activity guidelines for adults, stress management and opportunities for social connections. I hope you find this information useful.   -----------------------------------------------------------------------------------------------------------------------------------------------------------------------------------------------------------------------------------------------------------    NUTRITION: -eat real food: lots of colorful  vegetables (half the plate) and fruits -5-7 servings of vegetables and fruits per day (fresh or steamed is best), exp. 2 servings of vegetables with lunch and dinner and 2 servings of fruit per day. Berries and greens such as kale and collards are great choices.  -consume on a regular basis:  fresh fruits, fresh veggies, fish, nuts, seeds, healthy oils (such as olive oil, avocado oil), whole grains (make sure for bread/pasta/crackers/etc., that the first ingredient on label contains the word "whole"), legumes. -can eat small amounts of dairy and lean meat (no larger than the palm of your hand), but avoid processed meats such as ham, bacon, lunch meat, etc. -drink water -try to avoid fast food and pre-packaged foods, processed  meat, ultra processed foods/beverages (donuts, candy, etc.) -most experts advise limiting sodium to < 2300mg  per day, should limit further is any chronic conditions such as high blood pressure, heart disease, diabetes, etc. The American Heart Association advised that < 1500mg  is is ideal -try to avoid foods/beverages that contain any ingredients with names you do not recognize  -try to avoid foods/beverages  with added sugar or sweeteners/sweets  -try to avoid sweet drinks (including diet drinks): soda, juice, Gatorade, sweet tea, power drinks, diet drinks -try to avoid white rice, white bread, pasta (unless whole grain)  EXERCISE GUIDELINES FOR ADULTS: -if you wish to increase your physical activity, do so gradually and with the approval of your doctor -STOP and seek medical care immediately if you have any chest pain, chest discomfort or trouble breathing when starting or increasing exercise  -move and stretch your body, legs, feet and arms when sitting for long periods -Physical activity guidelines for optimal health in adults: -get at least 150 minutes per week of moderate exercise (can talk, but not sing); this is about 20-30 minutes of sustained activity 5-7 days per week  or two 10-15 minute episodes of sustained activity 5-7 days per week -do some muscle building/resistance training/strength training at least 2 days per week  -balance exercises 3+ days per week:   Stand somewhere where you have something sturdy to hold onto if you lose balance    1) lift up on toes, then back down, start with 5x per day and work up to 20x   2) stand and lift one leg straight out to the side so that foot is a few inches of the floor, start with 5x each side and work up to 20x each side   3) stand on one foot, start with 5 seconds each side and work up to 20 seconds on each side  If you need ideas or help with getting more active:  -Silver sneakers https://tools.silversneakers.com  -Walk with a Doc: http://www.duncan-williams.com/  -try to include resistance (weight lifting/strength building) and balance exercises twice per week: or the following link for ideas: http://castillo-powell.com/  BuyDucts.dk  STRESS MANAGEMENT: -can try meditating, or just sitting quietly with deep breathing while intentionally relaxing all parts of your body for 5 minutes daily -if you need further help with stress, anxiety or depression please follow up with your primary doctor or contact the wonderful folks at [facility]: [phone]  SOCIAL CONNECTIONS: -options in [place] if you wish to engage in more social and exercise related activities:  -Silver sneakers https://tools.silversneakers.com  -Walk with a Doc: http://www.duncan-williams.com/  -Check out the [facility] Active Adults 50+ section on the [place] of [employer] (hiking clubs, book clubs, cards and games, chess, exercise classes, aquatic classes and much more) - see the website for details: https://www.[place].gov/departments/parks-recreation/active-adults50  -YouTube has lots of exercise videos for different ages and  abilities as well  -[facility] (a variety of indoor and outdoor inperson activities for adults). [phone]. [address]  -Virtual Online Classes (a variety of topics): see seniorplanet.org or call [phone]  -consider volunteering at a school, hospice center, church, senior center or elsewhere            Maurie Southern, DO

## 2023-04-28 ENCOUNTER — Ambulatory Visit: Payer: Medicare Other | Admitting: Internal Medicine

## 2023-04-28 ENCOUNTER — Encounter: Payer: Self-pay | Admitting: Nurse Practitioner

## 2023-04-28 ENCOUNTER — Other Ambulatory Visit (INDEPENDENT_AMBULATORY_CARE_PROVIDER_SITE_OTHER)

## 2023-04-28 ENCOUNTER — Ambulatory Visit (INDEPENDENT_AMBULATORY_CARE_PROVIDER_SITE_OTHER): Admitting: Nurse Practitioner

## 2023-04-28 VITALS — HR 68 | Ht 60.0 in | Wt 107.0 lb

## 2023-04-28 DIAGNOSIS — K50919 Crohn's disease, unspecified, with unspecified complications: Secondary | ICD-10-CM

## 2023-04-28 DIAGNOSIS — K5900 Constipation, unspecified: Secondary | ICD-10-CM

## 2023-04-28 LAB — CBC WITH DIFFERENTIAL/PLATELET
Basophils Absolute: 0.1 10*3/uL (ref 0.0–0.1)
Basophils Relative: 0.7 % (ref 0.0–3.0)
Eosinophils Absolute: 0.2 10*3/uL (ref 0.0–0.7)
Eosinophils Relative: 1.6 % (ref 0.0–5.0)
HCT: 32.8 % — ABNORMAL LOW (ref 36.0–46.0)
Hemoglobin: 11.1 g/dL — ABNORMAL LOW (ref 12.0–15.0)
Lymphocytes Relative: 11.1 % — ABNORMAL LOW (ref 12.0–46.0)
Lymphs Abs: 1.2 10*3/uL (ref 0.7–4.0)
MCHC: 33.7 g/dL (ref 30.0–36.0)
MCV: 92.5 fl (ref 78.0–100.0)
Monocytes Absolute: 0.6 10*3/uL (ref 0.1–1.0)
Monocytes Relative: 5.4 % (ref 3.0–12.0)
Neutro Abs: 8.6 10*3/uL — ABNORMAL HIGH (ref 1.4–7.7)
Neutrophils Relative %: 81.2 % — ABNORMAL HIGH (ref 43.0–77.0)
Platelets: 413 10*3/uL — ABNORMAL HIGH (ref 150.0–400.0)
RBC: 3.55 Mil/uL — ABNORMAL LOW (ref 3.87–5.11)
RDW: 15.3 % (ref 11.5–15.5)
WBC: 10.6 10*3/uL — ABNORMAL HIGH (ref 4.0–10.5)

## 2023-04-28 LAB — COMPREHENSIVE METABOLIC PANEL WITH GFR
ALT: 11 U/L (ref 0–35)
AST: 13 U/L (ref 0–37)
Albumin: 3.8 g/dL (ref 3.5–5.2)
Alkaline Phosphatase: 63 U/L (ref 39–117)
BUN: 50 mg/dL — ABNORMAL HIGH (ref 6–23)
CO2: 21 meq/L (ref 19–32)
Calcium: 9 mg/dL (ref 8.4–10.5)
Chloride: 103 meq/L (ref 96–112)
Creatinine, Ser: 2.08 mg/dL — ABNORMAL HIGH (ref 0.40–1.20)
GFR: 22.51 mL/min — ABNORMAL LOW (ref >60.00)
Glucose, Bld: 95 mg/dL (ref 70–99)
Potassium: 4.1 meq/L (ref 3.5–5.1)
Sodium: 133 meq/L — ABNORMAL LOW (ref 135–145)
Total Bilirubin: 0.4 mg/dL (ref 0.2–1.2)
Total Protein: 7.1 g/dL (ref 6.0–8.3)

## 2023-04-28 LAB — TSH: TSH: 0.99 u[IU]/mL (ref 0.35–5.50)

## 2023-04-28 LAB — SEDIMENTATION RATE: Sed Rate: 25 mm/h (ref 0–30)

## 2023-04-28 LAB — C-REACTIVE PROTEIN: CRP: 1.2 mg/dL (ref 0.5–20.0)

## 2023-04-28 NOTE — Patient Instructions (Addendum)
 Your provider has requested that you go to the basement level for lab work before leaving today. Press "B" on the elevator. The lab is located at the first door on the left as you exit the elevator.  Miralax - take every night as needed  Go to the ED if you develop vomiting or severe abdominal pain.  Due to recent changes in healthcare laws, you may see the results of your imaging and laboratory studies on MyChart before your provider has had a chance to review them.  We understand that in some cases there may be results that are confusing or concerning to you. Not all laboratory results come back in the same time frame and the provider may be waiting for multiple results in order to interpret others.  Please give us  48 hours in order for your provider to thoroughly review all the results before contacting the office for clarification of your results.   Thank you for trusting me with your gastrointestinal care!   Everett Hitt, CRNP

## 2023-04-28 NOTE — Progress Notes (Addendum)
 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 Marny Sires 01/11/2023, IBS and Crohn's colitis initially diagnosed in 1997 on Mesalamine  4.8 gm po every day. S/P robotic hiatal hernia surgery by Dr. Marny Sires 01/11/2023.    She was last seen by Dr. Elvin Hammer in office 02/20/2023 and at that time she was instructed to continue Citrucel, Colestid , Bentyl , Lomotil , mesalamine  and Budesonide  9 mg daily for chronic diarrhea/Crohn's colitis.  She presented to the ED 04/16/2023 with generalized abdominal pain, more pronounced to the lower abdomen.  Labs in the ED showed a WBC count of 10.0.  Hemoglobin 12.2.  Hematocrit 36.2.  Platelet 369.  Sodium 130.  Creatinine 3.10 up from 1.11 two months ago.  BUN 68 up from 24.  GFR 15.  Normal LFTs and lipase.  Urine protein 30 and trace ketones.  Chest x-ray showed minor bibasilar atelectasis/scarring.  CTAP without contrast showed terminal ileitis with a dilated TI filled with fecalized material suggestive of a developing obstruction, irregular bowel wall thickening to the proximal transverse colon in the setting of ? past right colectomy and gastric lumen filled with multiple oval hyperdensities consistent with medications, possibly mesalamine  and 4 vessel coronary calcifications.  The ED physician contacted Dr. Karene Oto our on-call physician who recommended steroids and follow-up in her GI clinic.  She was discharged home on Prednisone  40 mg x 4 days.  She presents today for further evaluation.  She endorses having chronic lower abdominal pain on and off for the past few years.  Her husband stated his wife's abdominal pain occurs daily and he often sees her sitting  bending forward due to her significant abdominal pain.  Since she was evaluated in the ED as noted above, she continues to have persistent lower abdominal pain which comes and goes throughout the day which is sometimes worse after eating.  No specific food triggers.  She went 3 to 4 days without passing a bowel movement following her ED evaluation as well.  Over the past 2 weeks, she is passing small soft stools "loose floaters" several times daily.  No bloody or black stools.  She is adherent with taking mesalamine  4.8 g daily.  She takes dicyclomine  2-3 times weekly.  She previously stopped taking colestipol .  No NSAID use.  No nausea or vomiting.  No fevers or night sweats.  She has lost 2 or 3 pounds over the past 3 weeks.  Her most recent colonoscopy 11/13/2020 showed a normal ileum with changes of chronic colitis to the right and transverse colon consistent with Crohn's disease treated with Budesonide  and Colestid .  No known family history of IBD or colorectal cancer.      Latest Ref Rng & Units 04/16/2023    7:20 PM 02/03/2023    4:25 AM 02/02/2023    5:04 AM  CBC  WBC 4.0 - 10.5 K/uL 10.0  9.8  10.2   Hemoglobin 12.0 - 15.0 g/dL 40.9  81.1  91.4   Hematocrit 36.0 - 46.0 % 36.2  30.5  31.0   Platelets 150 - 400 K/uL 369  286  282        Latest Ref Rng & Units 04/16/2023    8:30 PM 02/03/2023    4:25 AM  02/02/2023    5:04 AM  CMP  Glucose 70 - 99 mg/dL 161  096  95   BUN 8 - 23 mg/dL 68  24  22   Creatinine 0.44 - 1.00 mg/dL 0.45  4.09  8.11   Sodium 135 - 145 mmol/L 130  134  138   Potassium 3.5 - 5.1 mmol/L 4.5  4.0  4.1   Chloride 98 - 111 mmol/L 103  100  107   CO2 22 - 32 mmol/L 15  25  24    Calcium  8.9 - 10.3 mg/dL 8.9  8.4  8.4   Total Protein 6.5 - 8.1 g/dL 7.2     Total Bilirubin 0.0 - 1.2 mg/dL 0.5     Alkaline Phos 38 - 126 U/L 83     AST 15 - 41 U/L 18     ALT 0 - 44 U/L 12        CT ABDOMEN AND PELVIS WITHOUT CONTRAST 04/16/2023:   TECHNIQUE: Multidetector CT imaging of  the abdomen and pelvis was performed following the standard protocol without IV contrast.   RADIATION DOSE REDUCTION: This exam was performed according to the departmental dose-optimization program which includes automated exposure control, adjustment of the mA and/or kV according to patient size and/or use of iterative reconstruction technique.   COMPARISON:  CT abdomen pelvis 11/04/2022   FINDINGS: Lower chest: Bibasilar atelectasis. Four-vessel coronary calcification.   Hepatobiliary: No focal liver abnormality. No gallstones, gallbladder wall thickening, or pericholecystic fluid. No biliary dilatation.   Pancreas: Diffusely atrophic. No focal lesion. Otherwise normal pancreatic contour. No surrounding inflammatory changes. No main pancreatic ductal dilatation.   Spleen: Normal in size without focal abnormality.   Adrenals/Urinary Tract:   No adrenal nodule bilaterally.   No nephrolithiasis and no hydronephrosis. No definite contour-deforming renal mass.   No ureterolithiasis or hydroureter.   The urinary bladder is unremarkable.   Stomach/Bowel: Question partial right colectomy. Gastric lumen filled with multiple oval hyperdensities. Otherwise stomach is within normal limits. No evidence of bowel wall thickening or dilatation. Increased stool burden throughout colon with dilatation of the terminal ileum filled with fecalized material (4:51) and associated bowel wall thickening as well as fat stranding. Associated irregular bowel wall thickening of the proximal transverse colon in the setting of a likely partial right colectomy (4:59). Loss of haustral of the colon suggestive of chronic inflammatory changes. Appendix not visualized   Vascular/Lymphatic: No abdominal aorta or iliac aneurysm. Severe atherosclerotic plaque of the aorta and its branches. No abdominal, pelvic, or inguinal lymphadenopathy.   Reproductive: Status post hysterectomy. No adnexal masses.    Other: No intraperitoneal free fluid. No intraperitoneal free gas. No organized fluid collection.   Musculoskeletal:   No abdominal wall hernia or abnormality.   No suspicious lytic or blastic osseous lesions. No acute displaced fracture. Levoscoliosis of the lumbar spine centered at the L3-L4 level with associated dextroscoliosis of the thoracolumbar spine. Associated multilevel severe degenerative changes of the spine.   IMPRESSION: 1. Terminal ileitis with dilated terminal ileum filled with fecalized material suggestive of developing obstruction. 2. Associated irregular bowel wall thickening of the proximal transverse colon in the setting of a likely partial right colectomy. Recommend colonoscopy status post treatment and status post complete resolution of inflammatory changes to exclude an underlying lesion. 3. Gastric lumen filled with multiple oval hyperdensities. Correlate with ingestion of medicine or candies. 4. Aortic Atherosclerosis (ICD10-I70.0) -including four-vessel coronary calcification.  PAST GI PROCEDURES:    Colonoscopy 11/13/2020  1. Normal ileum 2. Changes of chronic colitis with trivial patchy inflammation status post biopsies. 1. Surgical [P], right colon biopsy  - MILDLY ACTIVE CHRONIC COLITIS, CONSISTENT WITH PATIENT'S CLINICAL HISTORY OF CROHN'S DISEASE - NEGATIVE FOR GRANULOMAS OR DYSPLASIA 2. Surgical [P], transverse colon biopsy - MILDLY ACTIVE CHRONIC COLITIS, CONSISTENT WITH PATIENT'S CLINICAL HISTORY OF CROHN'S DISEASE - NEGATIVE FOR GRANULOMAS OR DYSPLASIA 3. Surgical [P], left colon biopsy - INACTIVE CHRONIC COLITIS, CONSISTENT WITH PATIENT'S CLINICAL HISTORY OF CROHN'S DISEASE - NEGATIVE FOR GRANULOMAS OR DYSPLASIA  Colonoscopy with Dr. Joye Nobles May 06, 2017:  The ileum was normal.  Chronic colitis noted in the left colon.  Diminutive pseudopolyp in the cecum.  Mild internal hemorrhoids.  Biopsies of the cecal polyp were a benign lymphoid  aggregate.  Biopsies in the right colon were normal.  Biopsies in the left colon revealed inactive chronic colitis.  The patient was told to take Lialda  4.8 g daily for 8 weeks then 2.4 g daily thereafter.   Current Outpatient Medications on File Prior to Visit  Medication Sig Dispense Refill   albuterol  (PROVENTIL ) (2.5 MG/3ML) 0.083% nebulizer solution Take 3 mLs (2.5 mg total) by nebulization every 6 (six) hours as needed for wheezing or shortness of breath. 75 mL 5   albuterol  (VENTOLIN  HFA) 108 (90 Base) MCG/ACT inhaler Inhale 2 puffs into the lungs every 6 (six) hours as needed for wheezing or shortness of breath. 8 g 6   Budeson-Glycopyrrol-Formoterol  (BREZTRI  AEROSPHERE) 160-9-4.8 MCG/ACT AERO Inhale 2 puffs into the lungs in the morning and at bedtime.     budesonide -formoterol  (SYMBICORT ) 160-4.5 MCG/ACT inhaler Inhale 2 puffs into the lungs in the morning and at bedtime. 1 each 12   Calcium  Citrate-Vitamin D (CALCIUM  + D PO) Take 1 tablet by mouth 2 (two) times daily.     cetirizine  (ZYRTEC ) 10 MG tablet Take 1 tablet (10 mg total) by mouth daily. 90 tablet 1   colestipol  (COLESTID ) 1 g tablet Take 2 tablets (2 g total) by mouth 2 (two) times daily. 180 tablet 3   dicyclomine  (BENTYL ) 20 MG tablet Take 1 tablet (20 mg total) by mouth 4 (four) times daily as needed for spasms. 360 tablet 3   diphenoxylate -atropine  (LOMOTIL ) 2.5-0.025 MG tablet TAKE 2 TABLETS IN THE MORNING, AT NOON AND AT BEDTIME AS DIRECTED 180 tablet 3   DULoxetine  (CYMBALTA ) 60 MG capsule Take 1 capsule (60 mg total) by mouth 2 (two) times daily. 180 capsule 1   fluticasone  (FLONASE ) 50 MCG/ACT nasal spray SPRAY 2 SPRAYS INTO EACH NOSTRIL EVERY DAY 48 mL 0   furosemide  (LASIX ) 20 MG tablet TAKE 1 TABLET (20 MG TOTAL) BY MOUTH DAILY AS NEEDED FOR EDEMA. 90 tablet 0   losartan  (COZAAR ) 100 MG tablet Take 1 tablet (100 mg total) by mouth daily. 90 tablet 1   mesalamine  (LIALDA ) 1.2 g EC tablet Take 4 tablets (4.8 g total)  by mouth daily with breakfast. 360 tablet 3   Multiple Vitamin (MULTIVITAMIN WITH MINERALS) TABS tablet Take 1 tablet by mouth daily.     omeprazole  (PRILOSEC) 40 MG capsule Take 1 capsule (40 mg total) by mouth daily before breakfast. TAKE 1 CAPSULE (40 MG TOTAL) BY MOUTH IN THE MORNING, AT NOON, AND AT BEDTIME. 90 capsule 3   ondansetron  (ZOFRAN ) 4 MG tablet Take 1 tablet (4 mg total) by mouth every 8 (eight) hours as needed for nausea or vomiting. 90 tablet 0   ondansetron  (ZOFRAN -ODT) 4 MG disintegrating tablet 4mg  ODT  q4 hours prn nausea/vomit 20 tablet 0   oxycodone  (OXY-IR) 5 MG capsule Take 1 capsule (5 mg total) by mouth every 6 (six) hours as needed for pain. 30 capsule 0   oxyCODONE -acetaminophen  (PERCOCET) 5-325 MG tablet Take 1 tablet by mouth every 4 (four) hours as needed for severe pain (pain score 7-10). 20 tablet 0   predniSONE  (DELTASONE ) 20 MG tablet 2 tabs po daily x 4 days 8 tablet 0   valACYclovir  (VALTREX ) 1000 MG tablet TAKE 1 TABLET (1,000 MG TOTAL) BY MOUTH TWICE A DAY AS NEEDED 90 tablet 1   No current facility-administered medications on file prior to visit.   Allergies  Allergen Reactions   Adhesive [Tape]     Redness and skin peeling   Aspirin     Intestinal Bleeding   Ciprofloxacin Nausea And Vomiting   Codeine Nausea And Vomiting   Demerol [Meperidine Hcl] Nausea And Vomiting   Fentanyl  Nausea And Vomiting    Makes me very sick   Lactose Intolerance (Gi) Other (See Comments)    Pt has Crohn's    Latex     Redness and skin peeling   Other Itching    Nuts-itching in throat  Seeds-stomach issues with chrons  Pt has emphysema    Prednisone  Other (See Comments)    Upset stomach   Septra [Sulfamethoxazole-Trimethoprim] Nausea Only    Current Medications, Allergies, Past Medical History, Past Surgical History, Family History and Social History were reviewed in [employer] record.  Review of Systems:   Constitutional: See  HPI. Respiratory: Negative for shortness of breath.   Cardiovascular: Negative for chest pain, palpitations and leg swelling.  Gastrointestinal: See HPI.  Musculoskeletal: Negative for back pain or muscle aches.  Neurological: Negative for dizziness, headaches or paresthesias.   Physical Exam: Ht 5' (1.524 m)   Wt 107 lb (48.5 kg)   BMI 20.90 kg/m  Wt Readings from Last 3 Encounters:  04/28/23 107 lb (48.5 kg)  03/19/23 112 lb 14.4 oz (51.2 kg)  02/20/23 111 lb 3.2 oz (50.4 kg)    General: 78 year old female in no acute distress. Head: Normocephalic and atraumatic. Eyes: No scleral icterus. Conjunctiva pink . Ears: Normal auditory acuity. Mouth: Dentition intact. No ulcers or lesions.  Lungs: Clear throughout to auscultation. Heart: Regular rate and rhythm, no murmur. Abdomen: Soft, nondistended. Mild tenderness to the central lower abdomen without rebound or guarding. Thickened colon palpated to the LLQ. hepatomegaly. Normal bowel sounds x 4 quadrants.  Rectal: Deferred.  Musculoskeletal: Significant scoliosis. Extremities: No edema. Neurological: Alert oriented x 4. No focal deficits.  Psychological: Alert and cooperative. Normal mood and affect  Assessment and Recommendations:  78 year old female with Crohn's disease who presented to the ED 04/16/2023 with acute on chronic lower abdominal pain. CTAP without contrast showed terminal ileitis with a dilated TI filled with fecalized material suggestive of a developing obstruction, irregular bowel wall thickening to the proximal transverse colon consistent with Crohn's flare. CT also showed pills (likely Mesalamine  in the gastric lumen), query lack of Mesalamine  absorption possibly contributing to Crohn's flare. Treated with Prednisone  40mg  x 4 days. Her most recent colonoscopy 11/13/2020 showed a normal ileum with changes of chronic colitis to the right and transverse colon consistent with Crohn's disease treated with Budesonide  and  Colestid . -Eventual small bowel enterography when renal status normalizes  -Colonoscopy, timing to be determined  -CBC, CMP, sed rate, CRP  -Quantiferon gold, hepatitis B surface antigen, hepatitis B core total  antibody and hepatitis B surface antibodies in preparation for possible future biologic therapy -Patient tried to go to the emergency room if she develops severe abdominal pain or vomiting -Patient wishes to transition GI management to Dr. Bridgett Camps  AKI, Cr 3.10 on 04/16/2023 - Follow-up with PCP, recommend nephrology evaluation if creatinine level remains elevated - Patient instructed to drink 64 ounces of water  daily  S/P hiatal hernia repair 01/2023  Four vessel coronary calcifications per CTAP -Follow up with PCP, recommend cardiology evaluation   Pancreatic atrophy per CTAP, patient previously was prescribed pancreatic enzymes when having oily loose stools without improvement.  I have reviewed the clinic note as outlined by Everett Hitt, NP and agree with the assessment, plan and medical decision making.  Ms. Atondo has a history of Crohn's colitis maintained on mesalamine  and pancreatic insufficiency for which she returns to the gastroenterology office.  During recent ED visit for chronic lower abdominal pain was noted to have evidence of terminal ileitis with dilated TI and bowel wall thickening of the transverse colon concerning for Crohn's flare treated with high-dose prednisone  x 4 days.  ESR and CRP are normal.  Fecal calprotectin elevated at 213.  I agree with additional evaluation with cross-sectional imaging and future colonoscopy.  By records, patient's previous Crohn's phenotype was limited to the large intestine and not necessarily small intestine rendering further investigation appropriate before escalating treatment.  Noted to have elevation of creatinine 2-3 to be followed up by PCP and potentially nephrology evaluation.  Eugenia Hess, MD

## 2023-04-29 ENCOUNTER — Telehealth: Payer: Self-pay | Admitting: Internal Medicine

## 2023-04-29 ENCOUNTER — Other Ambulatory Visit: Payer: Self-pay

## 2023-04-29 DIAGNOSIS — K50919 Crohn's disease, unspecified, with unspecified complications: Secondary | ICD-10-CM

## 2023-04-29 LAB — HEPATITIS B CORE ANTIBODY, TOTAL: Hep B Core Total Ab: NONREACTIVE

## 2023-04-29 LAB — HEPATITIS B SURFACE ANTIBODY,QUALITATIVE: Hep B S Ab: NONREACTIVE

## 2023-04-29 LAB — HEPATITIS B SURFACE ANTIGEN: Hepatitis B Surface Ag: NONREACTIVE

## 2023-04-29 MED ORDER — BUDESONIDE 3 MG PO CPEP: ORAL_CAPSULE | ORAL | 1 refills | Status: DC

## 2023-04-29 NOTE — Telephone Encounter (Signed)
 Copied from CRM [id number]. Topic: Referral - Request for Referral >> Apr 29, 2023 10:04 AM Kita Perish H wrote: Did the patient discuss referral with their provider in the last year? Yes (If No - schedule appointment) (If Yes - send message)  Appointment offered? No  Type of order/referral and detailed reason for visit: Kidney Specialist, had CT scan that shows kidneys are failing.  Preference of office, provider, location:[place]  If referral order, have you been seen by this specialty before? No (If Yes, this issue or another issue? When? Where?  Can we respond through MyChart? Yes

## 2023-04-29 NOTE — Progress Notes (Addendum)
 Prescription sent in for budesonide . Waiting on clarification on MR order.  MR entero ordered. Rad scheduling to contact pt with appt.

## 2023-04-29 NOTE — Progress Notes (Signed)
 [name]/Dottie: I called the patient at this time and discussed Dr. Alberta Almond recommendations.  She was initially reluctant to take a steroid (Budesonide ) but after explaining this medication will reduce her inflammation and hopefully reduce her abdominal pain she agreed to take it.  Please send a prescription for budesonide  3 mg tab take 3 tabs (9mg ) p.o. daily x 4 weeks then 2 tabs (6mg ) p.o. daily x 4 weeks then 1 tab (3mg ) p.o. daily x 1 week.  Please schedule the patient for a small bowel MRI to assess for small bowel Crohn's disease. Thank you.

## 2023-04-29 NOTE — Telephone Encounter (Signed)
 Left message on machine for patient to return our call

## 2023-04-30 ENCOUNTER — Other Ambulatory Visit

## 2023-04-30 ENCOUNTER — Encounter: Payer: Self-pay | Admitting: Pulmonary Disease

## 2023-04-30 ENCOUNTER — Telehealth: Payer: Self-pay

## 2023-04-30 ENCOUNTER — Ambulatory Visit: Payer: Medicare Other | Admitting: Pulmonary Disease

## 2023-04-30 VITALS — BP 138/70 | HR 67 | Ht 60.0 in | Wt 109.8 lb

## 2023-04-30 DIAGNOSIS — R053 Chronic cough: Secondary | ICD-10-CM | POA: Diagnosis not present

## 2023-04-30 DIAGNOSIS — J432 Centrilobular emphysema: Secondary | ICD-10-CM | POA: Diagnosis not present

## 2023-04-30 DIAGNOSIS — J479 Bronchiectasis, uncomplicated: Secondary | ICD-10-CM

## 2023-04-30 DIAGNOSIS — K50919 Crohn's disease, unspecified, with unspecified complications: Secondary | ICD-10-CM

## 2023-04-30 DIAGNOSIS — Z87891 Personal history of nicotine dependence: Secondary | ICD-10-CM

## 2023-04-30 DIAGNOSIS — N183 Chronic kidney disease, stage 3 unspecified: Secondary | ICD-10-CM | POA: Diagnosis not present

## 2023-04-30 DIAGNOSIS — M419 Scoliosis, unspecified: Secondary | ICD-10-CM

## 2023-04-30 DIAGNOSIS — K5000 Crohn's disease of small intestine without complications: Secondary | ICD-10-CM

## 2023-04-30 DIAGNOSIS — Z9889 Other specified postprocedural states: Secondary | ICD-10-CM

## 2023-04-30 LAB — QUANTIFERON-TB GOLD PLUS
Mitogen-NIL: 4.44 [IU]/mL
NIL: 0.04 [IU]/mL
QuantiFERON-TB Gold Plus: NEGATIVE
TB1-NIL: 0 [IU]/mL
TB2-NIL: <0 [IU]/mL

## 2023-04-30 MED ORDER — ALBUTEROL SULFATE (2.5 MG/3ML) 0.083% IN NEBU: 2.5000 mg | INHALATION_SOLUTION | Freq: Four times a day (QID) | RESPIRATORY_TRACT | 12 refills | Status: DC | PRN

## 2023-04-30 NOTE — Telephone Encounter (Signed)
 Copied from CRM [id number]. Topic: General - Other >> Apr 30, 2023 10:54 AM Allyne Areola wrote: Reason for CRM: Patient is returning a call she received from Kayleen Party, I advised of Dr.Hernandez message and patient stated that she is not taking the lasix  medication. She is not up for stopping losartan  as her blood pressure jumps up high.

## 2023-04-30 NOTE — Patient Instructions (Addendum)
 We will order you a nebulizer machine and albuterol  nebulizer solution to use twice daily followed by flutter valve breathing device.   Use the flutter valve 2-3 times per day to clear out mucous from your airways  We will consider putting you back on an inhaler in the future if the cough dose not improve  Referral to the kidney doctors has been placed  Follow up in 4 months
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
## 2023-04-30 NOTE — Telephone Encounter (Signed)
 Left a detailed message with the information below at the patient's cell number.

## 2023-05-01 ENCOUNTER — Encounter: Payer: Self-pay | Admitting: Pulmonary Disease

## 2023-05-03 LAB — CALPROTECTIN, FECAL: Calprotectin, Fecal: 213 ug/g — ABNORMAL HIGH (ref 0–120)

## 2023-05-05 NOTE — Progress Notes (Signed)
 I called patient and this time, she did not answer. I left a detailed message on her person voicemail stating Dr. Bridgett Camps recommended Dr. Yvone Herd IBD specialist to be her primary GI at this juncture. I will contact patient once I receive her small bowel MRI results.

## 2023-05-05 NOTE — Telephone Encounter (Signed)
2nd attempt. Left message on machine for patient to return our call. 

## 2023-05-06 NOTE — Telephone Encounter (Signed)
3rd attempt Left message on machine for patient to return our call. 

## 2023-05-08 NOTE — Telephone Encounter (Deleted)
 Spoke to the patient.   She is not taking lasix  She will not stop taking losartan  She has an MRI scheduled for Monday, of her abdomin.  She would like to wait on the results before scheduling a CT or labs

## 2023-05-08 NOTE — Telephone Encounter (Signed)
 Spoke to the patient: She is not taking lasix  She will not stop taking losartan  She has a MRI scheduled for Monday.  She would like to wait on the results before scheduling labs.

## 2023-05-12 ENCOUNTER — Other Ambulatory Visit: Payer: Self-pay

## 2023-05-12 ENCOUNTER — Ambulatory Visit (HOSPITAL_COMMUNITY)
Admission: RE | Admit: 2023-05-12 | Discharge: 2023-05-12 | Disposition: A | Discharge status: Home / Self Care | Source: Ambulatory Visit | Attending: Nurse Practitioner | Admitting: Nurse Practitioner

## 2023-05-12 DIAGNOSIS — K50919 Crohn's disease, unspecified, with unspecified complications: Secondary | ICD-10-CM | POA: Insufficient documentation

## 2023-05-12 MED ORDER — GADOBUTROL 1 MMOL/ML IV SOLN
5.0000 mL | Freq: Once | INTRAVENOUS | Status: AC | PRN
  Administered 2023-05-12: 5 mL via INTRAVENOUS

## 2023-05-26 ENCOUNTER — Ambulatory Visit: Payer: Self-pay | Admitting: Internal Medicine

## 2023-05-30 MED ORDER — DIPHENOXYLATE-ATROPINE 2.5-0.025 MG PO TABS: ORAL_TABLET | ORAL | 1 refills | Status: DC

## 2023-06-01 ENCOUNTER — Other Ambulatory Visit: Payer: Self-pay | Admitting: Internal Medicine

## 2023-06-03 ENCOUNTER — Ambulatory Visit (INDEPENDENT_AMBULATORY_CARE_PROVIDER_SITE_OTHER): Admitting: Audiology

## 2023-06-03 ENCOUNTER — Ambulatory Visit (INDEPENDENT_AMBULATORY_CARE_PROVIDER_SITE_OTHER): Admitting: Otolaryngology

## 2023-06-03 ENCOUNTER — Encounter (INDEPENDENT_AMBULATORY_CARE_PROVIDER_SITE_OTHER): Payer: Self-pay | Admitting: Otolaryngology

## 2023-06-03 VITALS — BP 144/72 | HR 78 | Ht 60.0 in | Wt 108.0 lb

## 2023-06-03 DIAGNOSIS — H90A32 Mixed conductive and sensorineural hearing loss, unilateral, left ear with restricted hearing on the contralateral side: Secondary | ICD-10-CM | POA: Diagnosis not present

## 2023-06-03 DIAGNOSIS — H6992 Unspecified Eustachian tube disorder, left ear: Secondary | ICD-10-CM

## 2023-06-03 DIAGNOSIS — R0982 Postnasal drip: Secondary | ICD-10-CM | POA: Diagnosis not present

## 2023-06-03 DIAGNOSIS — H9041 Sensorineural hearing loss, unilateral, right ear, with unrestricted hearing on the contralateral side: Secondary | ICD-10-CM

## 2023-06-03 DIAGNOSIS — H90A12 Conductive hearing loss, unilateral, left ear with restricted hearing on the contralateral side: Secondary | ICD-10-CM | POA: Diagnosis not present

## 2023-06-03 DIAGNOSIS — H938X2 Other specified disorders of left ear: Secondary | ICD-10-CM

## 2023-06-03 DIAGNOSIS — J3089 Other allergic rhinitis: Secondary | ICD-10-CM

## 2023-06-03 MED ORDER — AZELASTINE HCL 0.1 % NA SOLN: 2.0000 | Freq: Two times a day (BID) | NASAL | 12 refills | Status: AC

## 2023-06-03 NOTE — Patient Instructions (Addendum)
 Use flonase  two sprays each nostril two times per day; right after, use astelin spray two sprays each nostril twice per day I have ordered an imaging study for you to complete prior to your next visit. Please call [facility] Scheduling at [phone] to schedule your imaging if you have not received a call within 24 hours. If you are unable to complete your imaging study prior to your next scheduled visit please call our office to let us  know.

## 2023-06-03 NOTE — Progress Notes (Signed)
  [address], [address] [place], [place] [address] [phone]  Audiological Evaluation    Name: [name]     DOB:   [birth year]      MRN:   [id number]                                                                                     Service Date: 06/03/2023     Accompanied by: unaccompanied to the booth   Patient comes today after Dr. Lydia Sams, ENT sent a referral for a hearing evaluation due to concerns with ear fullness.   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 Still struggles hearing/clarity with them in.  Other  []      Otoscopy: Right ear: Clear external ear canals and notable landmarks visualized on the tympanic membrane. Left ear:  Abnormal eardrum appearance.  Tympanometry: Right ear: Type As- Normal external ear canal volume with normal middle ear pressure and low tympanic membrane compliance. Left ear: Type B- Normal external ear canal volume with no middle ear pressure peak or tympanic membrane compliance.    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  Speech Audiometry: Right ear- Speech Reception Threshold (SRT) was obtained at 45 dBHL. Left ear-Speech Reception Threshold (SRT) was obtained at 75 dBHL, with contralateral masking.   Word Recognition Score Tested using NU-6 (MLV) Right ear: 72% was obtained at a presentation  level of 85 dBHL with contralateral masking which is deemed as  fair. Left ear: 60% was obtained at a presentation level of 105 dBHL with contralateral masking which is deemed as  poor.   The hearing test results were completed under headphones and re-checked with inserts and results are deemed to be of good to fair reliability. Test technique:  conventional     Recommendations: Follow up with ENT as scheduled for today. Repeat audiogram after medical care.   Quaid Yeakle MARIE LEROUX-MARTINEZ, AUD

## 2023-06-03 NOTE — Progress Notes (Signed)
 Dear April. Zilphia Hilt, Here is my assessment for our mutual patient, [name]. Thank you for allowing me the opportunity to care for your patient. Please do not hesitate to contact me should you have any other questions. Sincerely, April. Milon Aloe  Otolaryngology Clinic Note Referring provider: Dr. Zilphia Hilt HPI:  [name] is a 78 y.o. female kindly referred by April. Zilphia Hilt for evaluation of hearing loss and ear fullness  Initial visit (05/2023):  Patient reports: Bilateral progressive hearing loss, left worse than right. Left fullness/ETD symptoms, but denies antecedent event including URI, infection or trauma. Has tried Nasal sprays - tried flonase , did not help. Mild benefit with zyrtec . Does have typical AR symptoms --- "all the time. Sneezing, itching nose" with PND. No CRS sx or frequent sinus infections.. Does wear HA for gradual onset b/l HL Of note, she does not remember prior myringotomy but prior chart review shows long intermittent left ear problem by April. [place] with prior myringotomy before moving to [place].  On budesonide  for Crohn's  Patient denies: ear pain, vertigo, drainage, tinnitus Patient also denies barotrauma, vestibular suppressant use, ototoxic medication use Prior ear surgery: denies No significant noise exposure  H&N Surgery: Tonsillectomy, prior myringotomy and sinus surgery ([place]?) Personal or FHx of bleeding dz or anesthesia difficulty: no  GLP-1: no AP/AC: no  Tobacco: prior, quit ~1990 (30 pack year)  PMHx: Crohns, Emphysema, RA, HTN, CKD  Independent Review of Additional Tests or Records:  April. 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 [place] notes (2021): scar inferiorly along left TM, Dx: ETD and  HL; Rx: flonase , irrigation, briefly discussed left tube but does not wish for it. CBC, CMP 04/28/2023: WBC 10.6, Eos 200; BUN/Cr 50/2.08 [facility] 11/11/2021 independently interpreted with respect to ears: cuts thick so suboptimal eval but hyperpneumatized mastoids with clear mastoids and ME; no obvious ossicular chain or otic capsule abnormality noted 06/2023 Audiogram was independently reviewed and interpreted by me and it reveals - noted right mild to then mod/sev SNHL with left mixed HL mod/sev with ABG ~20-30 dB; tymps: B AS, A AD   SNHL= Sensorineural hearing loss  PMH/Meds/All/SocHx/FamHx/ROS:   Past Medical History:  Diagnosis Date   Allergies    Anemia    Bruises easily    Complication of anesthesia    Crohn's disease (HCC)    GI April Joye [name] in [place], [place]     Deviated septum    GERD (gastroesophageal reflux disease)    History of hiatal hernia    HTN (hypertension)    pcp [name]  in [place], [place]     Kidney disease, chronic, stage III (GFR 30-59 ml/min) (HCC)    Neuropathy    fingers,periodically   Osteoarthritis    Pancreatic insufficiency    PONV (postoperative nausea and vomiting)    Scoliosis    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   Whooping cough 2015   Whooping cough with pneumonia    2017     Past Surgical History:  Procedure Laterality Date   BIOPSY  11/05/2022   Procedure: BIOPSY;  Surgeon: Nannette Babe, MD;  Location: [facility] ENDOSCOPY;  Service: Gastroenterology;;   CATARACT EXTRACTION, BILATERAL  2018   with IOC implant  COLONOSCOPY     DEBRIDEMENT AND CLOSURE WOUND Right 10/01/2018   Procedure: Excision of right knee wound with placement of ACell and Pravena;  Surgeon: Thornell Flirt, DO;  Location: [facility];  Service: Plastics;  Laterality: Right;  30 min   ESOPHAGOGASTRODUODENOSCOPY (EGD) WITH PROPOFOL  N/A 11/05/2022   Procedure: ESOPHAGOGASTRODUODENOSCOPY (EGD) WITH PROPOFOL ;  Surgeon: Nannette Babe, MD;  Location: [facility] ENDOSCOPY;  Service: Gastroenterology;  Laterality: N/A;   HERNIA REPAIR     knee fracture  Right    NASAL FRACTURE SURGERY     needed b/c she was getting frequent sinus infections    TONSILLECTOMY     TOTAL KNEE ARTHROPLASTY Right 07/28/2018   Procedure: TOTAL KNEE ARTHROPLASTY;  Surgeon: Claiborne Crew, MD;  Location: [facility] ORS;  Service: Orthopedics;  Laterality: Right;   TOTAL KNEE ARTHROPLASTY Left 03/02/2019   Procedure: TOTAL KNEE ARTHROPLASTY;  Surgeon: Claiborne Crew, MD;  Location: [facility];  Service: Orthopedics;  Laterality: Left;  70 mins   UPPER GASTROINTESTINAL ENDOSCOPY     XI ROBOTIC ASSISTED HIATAL HERNIA REPAIR N/A 01/31/2023   Procedure: XI ROBOTIC ASSISTED HIATAL HERNIA REPAIR WITH MESH;  Surgeon: Junie Olds, MD;  Location: [facility];  Service: General;  Laterality: N/A;    Family History  Problem Relation Age of Onset   Diabetes Father    Parkinson's disease Father    Colon cancer Other    Asthma Neg Hx    Esophageal cancer Neg Hx    Pancreatic cancer Neg Hx    Liver disease Neg Hx    Stomach cancer Neg Hx      Social Connections: Patient Declined (02/03/2023)   Social Connection and Isolation Panel [NHANES]    Frequency of Communication with Friends and Family: Patient declined    Frequency of Social Gatherings with Friends and Family: Patient declined    Attends Religious Services: Patient declined    Database administrator or Organizations: Patient declined    Attends Banker Meetings: Patient declined    Marital Status: Patient declined  Recent Concern: Social Connections - Moderately Isolated (01/31/2023)   Social Connection and Isolation Panel [NHANES]    Frequency of Communication with Friends and Family: More than three times a week    Frequency of Social Gatherings with Friends and Family: Once a week    Attends Religious Services: Never    Database administrator or Organizations: No    Attends Museum/gallery exhibitions officer: Never    Marital Status: Married      Current Outpatient Medications:    albuterol  (PROVENTIL ) (2.5 MG/3ML) 0.083% nebulizer solution, Take 3 mLs (2.5 mg total) by nebulization every 6 (six) hours as needed for wheezing or shortness of breath., Disp: 75 mL, Rfl: 12   albuterol  (VENTOLIN  HFA) 108 (90 Base) MCG/ACT inhaler, Inhale 2 puffs into the lungs every 6 (six) hours as needed for wheezing or shortness of breath., Disp: 8 g, Rfl: 6   azelastine  (ASTELIN ) 0.1 % nasal spray, Place 2 sprays into both nostrils 2 (two) times daily. Use in each nostril as directed, Disp: 30 mL, Rfl: 12   budesonide  (ENTOCORT EC ) 3 MG 24 hr capsule, Take 3 by mouth for 4 weeks, then take 2 by mouth for 4 weeks, then take 1 by mouth for 1 week., Disp: 90 capsule, Rfl: 1   Calcium  Citrate-Vitamin D (CALCIUM  + D PO), Take 1 tablet by mouth 2 (two) times daily., Disp: ,  Rfl:    cetirizine  (ZYRTEC ) 10 MG tablet, Take 1 tablet (10 mg total) by mouth daily., Disp: 90 tablet, Rfl: 1   dicyclomine  (BENTYL ) 20 MG tablet, Take 1 tablet (20 mg total) by mouth 4 (four) times daily as needed for spasms., Disp: 360 tablet, Rfl: 3   DULoxetine  (CYMBALTA ) 60 MG capsule, Take 1 capsule (60 mg total) by mouth 2 (two) times daily., Disp: 180 capsule, Rfl: 1   fluticasone  (FLONASE ) 50 MCG/ACT nasal spray, SPRAY 2 SPRAYS INTO EACH NOSTRIL EVERY DAY, Disp: 48 mL, Rfl: 0   furosemide  (LASIX ) 20 MG tablet, TAKE 1 TABLET (20 MG TOTAL) BY MOUTH DAILY AS NEEDED FOR EDEMA., Disp: 90 tablet, Rfl: 0   losartan  (COZAAR ) 100 MG tablet, Take 1 tablet (100 mg total) by mouth daily., Disp: 90 tablet, Rfl: 1   Multiple Vitamin (MULTIVITAMIN WITH MINERALS) TABS tablet, Take 1 tablet by mouth daily., Disp: , Rfl:    mupirocin  ointment (BACTROBAN ) 2 %, Apply 1 Application topically., Disp: , Rfl:    ondansetron  (ZOFRAN ) 4 MG tablet, Take 1 tablet (4 mg total) by mouth every 8 (eight) hours as needed for nausea or vomiting., Disp: 90  tablet, Rfl: 0   ondansetron  (ZOFRAN -ODT) 4 MG disintegrating tablet, 4mg  ODT q4 hours prn nausea/vomit, Disp: 20 tablet, Rfl: 0   triamcinolone  cream (KENALOG ) 0.1 %, Apply topically., Disp: , Rfl:    valACYclovir  (VALTREX ) 1000 MG tablet, TAKE 1 TABLET (1,000 MG TOTAL) BY MOUTH TWICE A DAY AS NEEDED, Disp: 90 tablet, Rfl: 1   colestipol  (COLESTID ) 1 g tablet, Take 2 tablets (2 g total) by mouth 2 (two) times daily. (Patient not taking: Reported on 06/03/2023), Disp: 180 tablet, Rfl: 3   diphenoxylate -atropine  (LOMOTIL ) 2.5-0.025 MG tablet, TAKE 2 TABLETS BY MOUTH IN THE MORNING ,AT NOON, AND AT BEDTIME AS DIRECTED, Disp: 60 tablet, Rfl: 1   mesalamine  (LIALDA ) 1.2 g EC tablet, Take 4 tablets (4.8 g total) by mouth daily with breakfast. (Patient not taking: Reported on 06/03/2023), Disp: 360 tablet, Rfl: 3   omeprazole  (PRILOSEC) 40 MG capsule, Take 1 capsule (40 mg total) by mouth daily before breakfast. TAKE 1 CAPSULE (40 MG TOTAL) BY MOUTH IN THE MORNING, AT NOON, AND AT BEDTIME. (Patient not taking: Reported on 06/03/2023), Disp: 90 capsule, Rfl: 3   oxycodone  (OXY-IR) 5 MG capsule, Take 1 capsule (5 mg total) by mouth every 6 (six) hours as needed for pain. (Patient not taking: Reported on 06/03/2023), Disp: 30 capsule, Rfl: 0   oxyCODONE -acetaminophen  (PERCOCET) 5-325 MG tablet, Take 1 tablet by mouth every 4 (four) hours as needed for severe pain (pain score 7-10). (Patient not taking: Reported on 06/03/2023), Disp: 20 tablet, Rfl: 0   predniSONE  (DELTASONE ) 20 MG tablet, 2 tabs po daily x 4 days (Patient not taking: Reported on 06/03/2023), Disp: 8 tablet, Rfl: 0   Physical Exam:   BP (!) 144/72 (BP Location: Right Arm, Patient Position: Sitting, Cuff Size: Normal)   Pulse 78   Ht 5' (1.524 m)   Wt 108 lb (49 kg)   SpO2 (!) 80%   BMI 21.09 kg/m   Salient findings:  CN II-XII intact Given history and complaints, ear microscopy was indicated and performed for evaluation with findings as below  in physical exam section and in procedures Bilateral EAC clear; left TM with small serous effusion, anterior monomeric area; right TM intact with well aerated ME Weber 512: left Rinne 512: AC > BC b/l Anterior rhinoscopy: Septum relatively  midline; bilateral inferior turbinates without significant hypertrophy No lesions of oral cavity/oropharynx No obviously palpable neck masses/lymphadenopathy/thyromegaly No respiratory distress or stridor   Seprately Identifiable Procedures:  Prior to initiating any procedures, risks/benefits/alternatives were explained to the patient and verbal consent obtained. Procedure: Bilateral ear microscopy using microscope (CPT [phone]) Pre-procedure diagnosis: left mixed hearing loss, rule out otitis media Post-procedure diagnosis: same Indication: see above; given patient's otologic complaints and history, for improved and comprehensive examination of external ear and tympanic membrane, bilateral otologic examination using microscope was performed  Procedure: Patient was placed semi-recumbent. Both ear canals were examined using the microscope with findings above Patient tolerated the procedure well.   Impression & Plans:  [name] is a 78 y.o. female with:  1. 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 Dysfunction of left eustachian tube   3. Sensation of fullness in left ear   4. Perennial allergic rhinitis   5. PND (post-nasal drip)    She does not remember left tymp tube but appears to have long-standing issues on left ear; noted B tymp with serous effusion; we discussed options including nasal endo, tymp tube v/s medical management for likely ETD She opted for medical management. Will get CT to ensure no CSF concern/tegmen dehiscence before proceeding if she wishes - Continue flonase  BID; start astelin  BID - CT temporal bones - f/u in 6-8 weeks - if no improvement, consider tube - Will avoid steroids given on  budesonide  for crohn's  See below regarding exact medications prescribed this encounter including dosages and route: Meds ordered this encounter  Medications   azelastine  (ASTELIN ) 0.1 % nasal spray    Sig: Place 2 sprays into both nostrils 2 (two) times daily. Use in each nostril as directed    Dispense:  30 mL    Refill:  12      Thank you for allowing me the opportunity to care for your patient. Please do not hesitate to contact me should you have any other questions.  Sincerely, Milon Aloe, MD Otolaryngologist (ENT), [facility] Phone: [phone] Fax: [phone]  06/15/2023, 11:02 AM   MDM:  Level 4 Complexity/Problems addressed: mod - chronic issues Data complexity: high - independent review of notes, labs, ordering test; independent interpretation of CT - Morbidity: mod  - Drug prescribed or managed: y

## 2023-06-16 NOTE — Progress Notes (Unsigned)
 [facility] Return Visit   Referring Provider Zilphia Hilt, Charyl Coppersmith, MD [address] [place],  [place] [address]  Primary Care Provider Zilphia Hilt, Charyl Coppersmith, MD  Patient Profile: [name] is a 78 y.o. female with a past medical history noteworthy for HTN, SCC of the RLE, RA, scoliosis who returns to the [facility] for follow-up of the problem(s) noted below.  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 Ringenberg was last seen in the GI office***   Current GI Meds  Budesonide  9 mg daily Mesalamine  4.8 g p.o. daily  Citrucel Colestid  Bentyl  Lomotil   Interval History    Last colonoscopy: *** Last endoscopy: ***  Last Abd CT/CTE/MRE: ***  GI Review of Symptoms Significant for {[id number]}. Otherwise negative.  General Review of Systems  Review of systems is significant for the pertinent positives and negatives as listed per the HPI.  Full ROS is otherwise negative.  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   Whooping cough 2015   Whooping cough with pneumonia    2017     Past Surgical History   Past Surgical History:   Procedure Laterality Date   BIOPSY  11/05/2022   Procedure: BIOPSY;  Surgeon: Nannette Babe, MD;  Location: [facility] ENDOSCOPY;  Service: Gastroenterology;;   CATARACT EXTRACTION, BILATERAL  2018   with IOC implant    COLONOSCOPY     DEBRIDEMENT AND CLOSURE WOUND Right 10/01/2018   Procedure: Excision of right knee wound with placement of ACell and Pravena;  Surgeon: Thornell Flirt, DO;  Location: [facility];  Service: Plastics;  Laterality: Right;  30 min   ESOPHAGOGASTRODUODENOSCOPY (EGD) WITH PROPOFOL  N/A 11/05/2022   Procedure: ESOPHAGOGASTRODUODENOSCOPY (EGD) WITH PROPOFOL ;  Surgeon: Nannette Babe, MD;  Location: [facility] ENDOSCOPY;  Service: Gastroenterology;  Laterality: N/A;   HERNIA REPAIR     knee fracture  Right    NASAL FRACTURE SURGERY     needed b/c she was getting frequent sinus infections    TONSILLECTOMY     TOTAL KNEE ARTHROPLASTY Right 07/28/2018   Procedure: TOTAL KNEE ARTHROPLASTY;  Surgeon: Claiborne Crew, MD;  Location: [facility]S;  Service: Orthopedics;  Laterality: Right;   TOTAL KNEE ARTHROPLASTY Left 03/02/2019   Procedure: TOTAL KNEE ARTHROPLASTY;  Surgeon: Claiborne Crew, MD;  Location: [facility];  Service: Orthopedics;  Laterality: Left;  70 mins   UPPER GASTROINTESTINAL ENDOSCOPY     XI ROBOTIC ASSISTED HIATAL HERNIA REPAIR N/A 01/31/2023   Procedure: XI ROBOTIC ASSISTED HIATAL HERNIA REPAIR WITH MESH;  Surgeon: Teddie Favre  J, MD;  Location: [facility];  Service: General;  Laterality: N/A;     Allergies and Medications   Allergies  Allergen Reactions   Adhesive [Tape]     Redness and skin peeling   Aspirin     Intestinal Bleeding   Ciprofloxacin Nausea And Vomiting   Codeine Nausea And Vomiting   Demerol [Meperidine Hcl] Nausea And Vomiting   Fentanyl  Nausea And Vomiting    Makes me very sick   Lactose Intolerance (Gi) Other (See Comments)    Pt has Crohn's    Latex     Redness and skin peeling   Other Itching    Nuts-itching in throat   Seeds-stomach issues with chrons  Pt has emphysema    Prednisone  Other (See Comments)    Upset stomach   Septra [Sulfamethoxazole-Trimethoprim] Nausea Only   @MEDSTODAY @  Family His   Family History  Problem Relation Age of Onset   Diabetes Father    Parkinson's disease Father    Colon cancer Other    Asthma Neg Hx    Esophageal cancer Neg Hx    Pancreatic cancer Neg Hx    Liver disease Neg Hx    Stomach cancer Neg Hx    GI Specific Family History: {gifamhx:50061}   Social History   Social History   Tobacco Use   Smoking status: Former    Current packs/day: 0.00    Average packs/day: 1 pack/day for 30.0 years (30.0 ttl pk-yrs)    Types: Cigarettes    Start date: 45    Quit date: 1990    Years since quitting: 35.4   Smokeless tobacco: Never  Vaping Use   Vaping status: Never Used  Substance Use Topics   Alcohol use: Not Currently   Drug use: Never   Joscelynn reports that she quit smoking about 35 years ago. Her smoking use included cigarettes. She started smoking about 65 years ago. She has a 30 pack-year smoking history. She has never used smokeless tobacco. She reports that she does not currently use alcohol. She reports that she does not use drugs.  Vital Signs and Physical Examination   There were no vitals filed for this visit. There is no height or weight on file to calculate BMI.    General: Well developed, well nourished, no acute distress Head: Normocephalic and atraumatic Eyes: Sclerae anicteric, EOMI Ears: Normal auditory acuity Mouth: No deformities or lesions noted Lungs: Clear throughout to auscultation Heart: Regular rate and rhythm; No murmurs, rubs or bruits Abdomen: Soft, non tender and non distended. No masses, hepatosplenomegaly or hernias noted. Normal Bowel sounds Rectal: Musculoskeletal: Symmetrical with no gross deformities  Pulses:  Normal pulses noted Extremities: No edema or deformities noted Neurological: Alert oriented x 4,  grossly nonfocal Psychological:  Alert and cooperative. Normal mood and affect   Review of Data   The following data was reviewed at the time of this encounter:   Laboratory Studies      Latest Ref Rng & Units 04/28/2023    2:17 PM 04/16/2023    7:20 PM 02/03/2023    4:25 AM  CBC  WBC 4.0 - 10.5 K/uL 10.6  10.0  9.8   Hemoglobin 12.0 - 15.0 g/dL 96.0  45.4  09.8   Hematocrit 36.0 - 46.0 % 32.8  36.2  30.5   Platelets 150.0 - 400.0 K/uL 413.0  369  286     Lab Results  Component Value Date   LIPASE 17 04/16/2023  Latest Ref Rng & Units 04/28/2023    2:17 PM 04/16/2023    8:30 PM 02/03/2023    4:25 AM  CMP  Glucose 70 - 99 mg/dL 95  960  454   BUN 6 - 23 mg/dL 50  68  24   Creatinine 0.40 - 1.20 mg/dL 0.98  1.19  1.47   Sodium 135 - 145 mEq/L 133  130  134   Potassium 3.5 - 5.1 mEq/L 4.1  4.5  4.0   Chloride 96 - 112 mEq/L 103  103  100   CO2 19 - 32 mEq/L 21  15  25    Calcium  8.4 - 10.5 mg/dL 9.0  8.9  8.4   Total Protein 6.0 - 8.3 g/dL 7.1  7.2    Total Bilirubin 0.2 - 1.2 mg/dL 0.4  0.5    Alkaline Phos 39 - 117 U/L 63  83    AST 0 - 37 U/L 13  18    ALT 0 - 35 U/L 11  12       Imaging Studies  MRE 05/12/2023 1. Appearance of the colon suggests partial right hemicolectomy and ileocolic anastomosis. 2. Interval resolution of distended, fecalized loops of neoterminal ileum seen on prior examination. No specifically visible evidence of stricture or obstruction on today's examination. 3. The remnant distal ascending and transverse colon remains diffusely featureless, thickened, and tethered, however without substantial mucosal hyperenhancement or acute inflammatory findings on today's examination. 4. The descending colon is relatively normal in appearance, and the sigmoid colon and rectum are decompressed although somewhat featureless and hyperenhancing. 5. Findings are consistent with chronic and postoperative stigmata of Crohn's disease with some evidence of  active inflammation involving the distal colon. No evidence of complicating stricture, obstruction, fistula, or abscess on today's examination.  CTAP 04/17/2023 1. Terminal ileitis with dilated terminal ileum filled with fecalized material suggestive of developing obstruction. 2. Associated irregular bowel wall thickening of the proximal transverse colon in the setting of a likely partial right colectomy. Recommend colonoscopy status post treatment and status post complete resolution of inflammatory changes to exclude an underlying lesion. 3. Gastric lumen filled with multiple oval hyperdensities. Correlate with ingestion of medicine or candies. 4. Aortic Atherosclerosis (ICD10-I70.0) -including four-vessel coronary calcification.  UGI 03/06/2023 Extensive tertiary contractions are noted in middle and distal portions of the esophagus suggesting presbyesophagus.   Moderate to severe persistent narrowing of gastroesophageal junction is noted; potentially this may be due to postoperative edema given recent surgery. 13 mm barium tablet was delayed at this level and not visualized to pass into the stomach.   There was very slow transit of contrast from stomach into duodenum. This may simply be due to motility disorder. Visualized portion of duodenum is unremarkable.   Mild gastric wall thickening is noted in body of stomach which may be due to recent surgery or may represent some degree of gastritis.  CTAP 11/04/2022 1. Wall thickening and mild mucosal hyperenhancement about the right and transverse colon compatible with colitis. This has slightly progressed compared to CT 09/24/2022. 2. Large hiatal hernia with intrathoracic stomach. 3. Fecalized ileum compatible with slow transit. No dilation to suggest obstruction.  MRI/MRCP 10/18/2022 1. Limited exam. 2. Small/atrophic pancreas with slightly prominent main pancreatic duct measuring up to 3-4 mm. There are multiple  subcentimeter T2 hyperintense cystic lesions throughout the pancreas with largest in the neck region measuring 7 x 7 mm which communicates with the main pancreatic duct via side branch and is favored  to represent a pancreatic side branch IPMN. Recommend follow up pre and post contrast MRI/MRCP in 2 years. 3. Partially exophytic 2.4 cm proteinaceous/hemorrhagic cyst arising from the left kidney lower pole. 4. Multiple other nonacute observations, as described above.  CTAP 09/24/2022 1. Short segment mural thickening of the hepatic flexure, which may be due to underdistention or colitis. 2. Trace free fluid adjacent to the pancreatic tail in the left upper quadrant, which is nonspecific but may be seen in the setting of acute pancreatitis. 3. Large hiatal hernia with nearly the entire stomach within the left chest. 4. Aortic Atherosclerosis (ICD10-I70.0) and Emphysema (ICD10-J43.9). Coronary artery calcifications. Assessment for potential risk factor modification, dietary therapy or pharmacologic therapy may be warranted, if clinically indicated.   GI Procedures and Studies  EGD 11/05/2022 - Tortuous esophagus - Large HH - > 50% of stomach is intrathoracic - Congested and erythematous mucosa in the gastric fundus and body, most likely secondary to [facility] - Normal duodenum  Path: A. STOMACH, BIOPSY:  Gastric oxyntic mucosa with focal hyperemia.  Negative for Helicobacter pylori.   Colonoscopy 11/23/2020  - Normal ileum - Changes of chronic colitis with trivial patchy inflammation status post biopsies  Path: 1. Surgical [P], right colon biopsy - MILDLY ACTIVE CHRONIC COLITIS, CONSISTENT WITH PATIENT'S CLINICAL HISTORY OF CROHN'S DISEASE - NEGATIVE FOR GRANULOMAS OR DYSPLASIA 2. Surgical [P], transverse colon biopsy - MILDLY ACTIVE CHRONIC COLITIS, CONSISTENT WITH PATIENT'S CLINICAL HISTORY OF CROHN'S DISEASE - NEGATIVE FOR GRANULOMAS OR DYSPLASIA 3. Surgical [P], left colon  biopsy - INACTIVE CHRONIC COLITIS, CONSISTENT WITH PATIENT'S CLINICAL HISTORY OF CROHN'S DISEASE - NEGATIVE FOR GRANULOMAS OR DYSPLASIA  Colonoscopy 06/05/2017 The ileum was normal. Chronic colitis noted in the left colon. Diminutive pseudopolyp in the cecum. Mild internal hemorrhoids. Biopsies of the cecal polyp were a benign lymphoid aggregate. Biopsies in the right colon were normal. Biopsies in the left colon revealed inactive chronic colitis. The patient was told to take Lialda  4.8 g daily for 8 weeks then 2.4 g daily thereafter.   Clinical Impression  It is my clinical impression that Ms. Macfadden is a 78 y.o. female with;  ***  Plan  *** *** *** *** ***   Planned Follow Up No follow-ups on file.  The patient or caregiver verbalized understanding of the material covered, with no barriers to understanding. All questions were answered. Patient or caregiver is agreeable with the plan outlined above.    It was a pleasure to see Deidre.  If you have any questions or concerns regarding this evaluation, do not hesitate to contact me.  Eugenia Hess, MD [facility]

## 2023-06-17 ENCOUNTER — Encounter: Payer: Self-pay | Admitting: Pediatrics

## 2023-06-17 ENCOUNTER — Ambulatory Visit: Admitting: Pediatrics

## 2023-06-17 ENCOUNTER — Other Ambulatory Visit (INDEPENDENT_AMBULATORY_CARE_PROVIDER_SITE_OTHER)

## 2023-06-17 VITALS — BP 128/66 | HR 59 | Ht 60.0 in | Wt 110.8 lb

## 2023-06-17 DIAGNOSIS — K5010 Crohn's disease of large intestine without complications: Secondary | ICD-10-CM

## 2023-06-17 DIAGNOSIS — R197 Diarrhea, unspecified: Secondary | ICD-10-CM | POA: Diagnosis not present

## 2023-06-17 DIAGNOSIS — K50919 Crohn's disease, unspecified, with unspecified complications: Secondary | ICD-10-CM

## 2023-06-17 DIAGNOSIS — Z8719 Personal history of other diseases of the digestive system: Secondary | ICD-10-CM

## 2023-06-17 DIAGNOSIS — K219 Gastro-esophageal reflux disease without esophagitis: Secondary | ICD-10-CM | POA: Diagnosis not present

## 2023-06-17 DIAGNOSIS — K529 Noninfective gastroenteritis and colitis, unspecified: Secondary | ICD-10-CM | POA: Diagnosis not present

## 2023-06-17 DIAGNOSIS — K862 Cyst of pancreas: Secondary | ICD-10-CM | POA: Diagnosis not present

## 2023-06-17 DIAGNOSIS — K449 Diaphragmatic hernia without obstruction or gangrene: Secondary | ICD-10-CM

## 2023-06-17 DIAGNOSIS — D490 Neoplasm of unspecified behavior of digestive system: Secondary | ICD-10-CM

## 2023-06-17 DIAGNOSIS — D649 Anemia, unspecified: Secondary | ICD-10-CM

## 2023-06-17 MED ORDER — DIPHENOXYLATE-ATROPINE 2.5-0.025 MG PO TABS: ORAL_TABLET | ORAL | 3 refills | Status: DC

## 2023-06-17 MED ORDER — NA SULFATE-K SULFATE-MG SULF 17.5-3.13-1.6 GM/177ML PO SOLN: 1.0000 | Freq: Once | ORAL | 0 refills | Status: AC

## 2023-06-17 NOTE — Patient Instructions (Signed)
 Your provider has requested that you go to the basement level for lab work before leaving today. Press "B" on the elevator. The lab is located at the first door on the left as you exit the elevator.  Due to recent changes in healthcare laws, you may see the results of your imaging and laboratory studies on MyChart before your provider has had a chance to review them.  We understand that in some cases there may be results that are confusing or concerning to you. Not all laboratory results come back in the same time frame and the provider may be waiting for multiple results in order to interpret others.  Please give us  48 hours in order for your provider to thoroughly review all the results before contacting the office for clarification of your results.   You have been scheduled for a colonoscopy. Please follow written instructions given to you at your visit today.   If you use inhalers (even only as needed), please bring them with you on the day of your procedure.  DO NOT TAKE 7 DAYS PRIOR TO TEST- Trulicity (dulaglutide) Ozempic, Wegovy (semaglutide) Mounjaro (tirzepatide) Bydureon Bcise (exanatide extended release)  DO NOT TAKE 1 DAY PRIOR TO YOUR TEST Rybelsus (semaglutide) Adlyxin (lixisenatide) Victoza (liraglutide) Byetta (exanatide) ___________________________________________________________________________   Thank you for entrusting me with your care and for choosing [facility], Dr. Eugenia Hess  _______________________________________________________  If your blood pressure at your visit was 140/90 or greater, please contact your primary care physician to follow up on this.  _______________________________________________________  If you are age 83 or older, your body mass index should be between 23-30. Your Body mass index is 21.64 kg/m. If this is out of the aforementioned range listed, please consider follow up with your Primary Care Provider.  If you are age 24  or younger, your body mass index should be between 19-25. Your Body mass index is 21.64 kg/m. If this is out of the aformentioned range listed, please consider follow up with your Primary Care Provider.   ________________________________________________________  The  GI providers would like to encourage you to use MYCHART to communicate with providers for non-urgent requests or questions.  Due to long hold times on the telephone, sending your provider a message by [facility] may be a faster and more efficient way to get a response.  Please allow 48 business hours for a response.  Please remember that this is for non-urgent requests.  _______________________________________________________

## 2023-06-18 ENCOUNTER — Ambulatory Visit: Admitting: Internal Medicine

## 2023-06-18 ENCOUNTER — Ambulatory Visit (HOSPITAL_COMMUNITY)
Admission: RE | Admit: 2023-06-18 | Discharge: 2023-06-18 | Disposition: A | Discharge status: Home / Self Care | Source: Ambulatory Visit | Attending: Otolaryngology | Admitting: Otolaryngology

## 2023-06-18 DIAGNOSIS — H90A12 Conductive hearing loss, unilateral, left ear with restricted hearing on the contralateral side: Secondary | ICD-10-CM | POA: Diagnosis not present

## 2023-06-19 ENCOUNTER — Ambulatory Visit: Admitting: Pediatrics

## 2023-06-21 ENCOUNTER — Ambulatory Visit: Payer: Self-pay | Admitting: Pediatrics

## 2023-06-23 ENCOUNTER — Telehealth: Payer: Self-pay

## 2023-06-23 NOTE — Telephone Encounter (Signed)
-----   Message from Truddie Furrow sent at 06/21/2023  4:31 PM EDT ----- Regarding: TTG IgA lab from 06/17/23 Hi Pod B -  Ms. Christmas had a series of labs drawn at the time of her clinic visit on 06/17/2023.  In reviewing her results the tissue transglutaminase IgA results says  needs to be collected -all of the other labs have been resulted.  Can you please contact the lab to find out if this is in process or if it was inadvertently not drawn?  Thanks,  Haskell Linker

## 2023-06-23 NOTE — Telephone Encounter (Signed)
 TTG order was not entered correctly so the lab did not draw it. I called [facility] & spoke with Loetta Ringer. TTG test is not performed in [place] and has to be shipped to [place]. Loetta Ringer has sent a request to [facility] to see if this test can be sent to [facility] by tomorrow morning (specimen is only viable for 7 days, counting collection date). Loetta Ringer states that if they are not able to add on test she will call me this afternoon, if test is added she will not call and we can expect the result by Thursday.

## 2023-06-23 NOTE — Addendum Note (Signed)
 Addended by: Kimara Bencomo N on: 06/23/2023 09:34 AM   Modules accepted: Orders

## 2023-06-24 ENCOUNTER — Other Ambulatory Visit

## 2023-06-24 DIAGNOSIS — K219 Gastro-esophageal reflux disease without esophagitis: Secondary | ICD-10-CM

## 2023-06-24 DIAGNOSIS — R197 Diarrhea, unspecified: Secondary | ICD-10-CM

## 2023-06-24 DIAGNOSIS — K50919 Crohn's disease, unspecified, with unspecified complications: Secondary | ICD-10-CM

## 2023-06-24 LAB — TISSUE TRANSGLUTAMINASE ABS,IGG,IGA
(tTG) Ab, IgA: <1 U/mL
(tTG) Ab, IgG: <1 U/mL

## 2023-06-24 LAB — ALPHA-GAL PANEL
Allergen, Mutton, f88: <0.1 kU/L
Allergen, Pork, f26: <0.1 kU/L
Beef: <0.1 kU/L
CLASS: 0
CLASS: 0
Class: 0
GALACTOSE-ALPHA-1,3-GALACTOSE IGE*: <0.1 kU/L (ref <0.10)

## 2023-06-24 LAB — IGA: Immunoglobulin A: 459 mg/dL — ABNORMAL HIGH (ref 70–320)

## 2023-06-24 LAB — INTERPRETATION:

## 2023-06-25 ENCOUNTER — Ambulatory Visit: Payer: Self-pay | Admitting: Pediatrics

## 2023-06-25 ENCOUNTER — Encounter: Payer: Self-pay | Admitting: Pediatrics

## 2023-06-25 DIAGNOSIS — D509 Iron deficiency anemia, unspecified: Secondary | ICD-10-CM

## 2023-06-25 DIAGNOSIS — K50919 Crohn's disease, unspecified, with unspecified complications: Secondary | ICD-10-CM

## 2023-06-25 DIAGNOSIS — K219 Gastro-esophageal reflux disease without esophagitis: Secondary | ICD-10-CM

## 2023-06-25 DIAGNOSIS — R197 Diarrhea, unspecified: Secondary | ICD-10-CM

## 2023-06-25 NOTE — Telephone Encounter (Signed)
 See 6/18 patient message. Patient wanted to add recommended EGD, procedure has been added. New ambulatory referral to GI in epic.

## 2023-06-25 NOTE — Telephone Encounter (Signed)
 Ttg lab resulted.

## 2023-06-28 LAB — FECAL FAT, QUALITATIVE

## 2023-06-28 LAB — CALPROTECTIN, FECAL: Calprotectin, Fecal: 266 ug/g — ABNORMAL HIGH (ref 0–120)

## 2023-07-03 ENCOUNTER — Encounter: Payer: Self-pay | Admitting: Internal Medicine

## 2023-07-03 DIAGNOSIS — I10 Essential (primary) hypertension: Secondary | ICD-10-CM

## 2023-07-03 DIAGNOSIS — F339 Major depressive disorder, recurrent, unspecified: Secondary | ICD-10-CM

## 2023-07-03 DIAGNOSIS — J302 Other seasonal allergic rhinitis: Secondary | ICD-10-CM

## 2023-07-03 LAB — PANCREATIC ELASTASE, FECAL: Pancreatic Elastase-1, Stool: 216 ug/g (ref >200)

## 2023-07-04 ENCOUNTER — Other Ambulatory Visit: Payer: Self-pay | Admitting: Internal Medicine

## 2023-07-04 DIAGNOSIS — B001 Herpesviral vesicular dermatitis: Secondary | ICD-10-CM

## 2023-07-04 MED ORDER — FLUTICASONE PROPIONATE 50 MCG/ACT NA SUSP: NASAL | 0 refills | Status: DC

## 2023-07-24 MED ORDER — DULOXETINE HCL 60 MG PO CPEP: 60.0000 mg | ORAL_CAPSULE | Freq: Two times a day (BID) | ORAL | 0 refills | Status: DC

## 2023-07-24 MED ORDER — CETIRIZINE HCL 10 MG PO TABS: 10.0000 mg | ORAL_TABLET | Freq: Every day | ORAL | 0 refills | Status: DC

## 2023-07-24 MED ORDER — LOSARTAN POTASSIUM 100 MG PO TABS: 100.0000 mg | ORAL_TABLET | Freq: Every day | ORAL | 0 refills | Status: DC

## 2023-07-24 NOTE — Addendum Note (Signed)
 Addended by: KATHRYNE MILLMAN B on: 07/24/2023 10:13 AM   Modules accepted: Orders

## 2023-07-28 NOTE — Progress Notes (Signed)
 [facility] History and Physical   Primary Care Physician:  Theophilus Andrews, Tully GRADE, MD   Reason for Procedure:  GERD, abdominal pain, nausea, gas, chronic diarrhea, Crohn's colitis and anemia  Plan:    Upper endoscopy and colonoscopy     HPI: [name] is a 78 y.o. female undergoing upper endoscopy and colonoscopy for investigation of GERD, abdominal pain, nausea, gas, chronic diarrhea, Crohn's colitis and anemia.  At time of recent clinic visit, patient reported symptoms as noted above.  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  Laboratory studies, however, showed ongoing anemia and therefore an EGD was felt to be warranted for further investigation.  Laboratory studies for celiac disease negative.  She has a history of Crohn's colitis diagnosed in 1997.  Previously treated with mesalamine  which was not beneficial.  Recent cross-sectional imaging showed possible terminal ileitis in April 2025.  She was briefly treated with corticosteroids.  More recent MRI E did not show any worrisome findings in the neoterminal ileum.  Colon appeared featureless.  There was suggestion of possible active inflammation.  She did not note improvement with budesonide .  Colonoscopy is performed today for disease activity reassessment and to perform colorectal cancer surveillance in the setting of long-term IBD.   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   Whooping cough 2015   Whooping cough with pneumonia    2017    Past Surgical History:  Procedure Laterality Date   BIOPSY  11/05/2022   Procedure: BIOPSY;  Surgeon: Albertus Gordy HERO, MD;  Location: [facility] ENDOSCOPY;  Service: Gastroenterology;;   CATARACT EXTRACTION, BILATERAL  2018   with IOC implant    COLONOSCOPY     DEBRIDEMENT AND CLOSURE WOUND Right 10/01/2018   Procedure: Excision of right knee wound with placement of ACell and Pravena;  Surgeon: Lowery Estefana RAMAN, DO;  Location: [facility];  Service: Plastics;  Laterality: Right;  30 min   ESOPHAGOGASTRODUODENOSCOPY (EGD) WITH PROPOFOL  N/A 11/05/2022   Procedure: ESOPHAGOGASTRODUODENOSCOPY (EGD) WITH PROPOFOL ;  Surgeon: Albertus Gordy HERO, MD;  Location: [facility] ENDOSCOPY;  Service: Gastroenterology;  Laterality: N/A;   HERNIA REPAIR     knee fracture  Right    NASAL FRACTURE SURGERY     needed b/c she was getting frequent sinus infections    TONSILLECTOMY     TOTAL KNEE ARTHROPLASTY Right 07/28/2018   Procedure: TOTAL KNEE ARTHROPLASTY;  Surgeon: Ernie Cough, MD;  Location: [facility] ORS;  Service: Orthopedics;  Laterality: Right;   TOTAL KNEE ARTHROPLASTY Left 03/02/2019   Procedure: TOTAL KNEE ARTHROPLASTY;  Surgeon: Ernie Cough, MD;  Location: [facility];  Service: Orthopedics;  Laterality: Left;  70 mins   UPPER GASTROINTESTINAL ENDOSCOPY  XI ROBOTIC ASSISTED HIATAL HERNIA REPAIR N/A 01/31/2023   Procedure: XI ROBOTIC ASSISTED HIATAL HERNIA REPAIR WITH MESH;  Surgeon: Stechschulte, Deward PARAS, MD;  Location: [facility];  Service: General;  Laterality: N/A;    Prior to Admission medications   Medication Sig Start Date End Date Taking? Authorizing Provider  albuterol  (VENTOLIN  HFA) 108 (90 Base) MCG/ACT inhaler Inhale 2 puffs into the lungs every 6 (six) hours as needed for wheezing or shortness of breath. 05/27/22   Cobb, Comer GAILS,  NP  azelastine  (ASTELIN ) 0.1 % nasal spray Place 2 sprays into both nostrils 2 (two) times daily. Use in each nostril as directed 06/03/23   Tobie Eldora NOVAK, MD  budesonide  (ENTOCORT EC ) 3 MG 24 hr capsule Take 3 by mouth for 4 weeks, then take 2 by mouth for 4 weeks, then take 1 by mouth for 1 week. 04/29/23   Kennedy-Smith, Colleen M, NP  Calcium  Citrate-Vitamin D (CALCIUM  + D PO) Take 1 tablet by mouth 2 (two) times daily.    [provider]  cetirizine  (ZYRTEC ) 10 MG tablet Take 1 tablet (10 mg total) by mouth daily. 07/24/23   Theophilus Andrews, Tully GRADE, MD  dicyclomine  (BENTYL ) 20 MG tablet Take 1 tablet (20 mg total) by mouth 4 (four) times daily as needed for spasms. 11/21/22   Abran Norleen SAILOR, MD  diphenoxylate -atropine  (LOMOTIL ) 2.5-0.025 MG tablet TAKE 2 TABLETS BY MOUTH IN THE MORNING ,AT NOON, AND AT BEDTIME AS DIRECTED 06/17/23   Sweet Jarvis, April HERO, MD  DULoxetine  (CYMBALTA ) 60 MG capsule Take 1 capsule (60 mg total) by mouth 2 (two) times daily. 07/24/23   Theophilus Andrews, Tully GRADE, MD  fluticasone  (FLONASE ) 50 MCG/ACT nasal spray SPRAY 2 SPRAYS INTO EACH NOSTRIL EVERY DAY 07/04/23   Theophilus Andrews, Tully GRADE, MD  furosemide  (LASIX ) 20 MG tablet TAKE 1 TABLET (20 MG TOTAL) BY MOUTH DAILY AS NEEDED FOR EDEMA. 04/14/23   Theophilus Andrews, Tully GRADE, MD  losartan  (COZAAR ) 100 MG tablet Take 1 tablet (100 mg total) by mouth daily. 07/24/23   Theophilus Andrews, Tully GRADE, MD  Multiple Vitamin (MULTIVITAMIN WITH MINERALS) TABS tablet Take 1 tablet by mouth daily.    [provider]  mupirocin  ointment (BACTROBAN ) 2 % Apply 1 Application topically. 04/17/22   [provider]  ondansetron  (ZOFRAN ) 4 MG tablet Take 1 tablet (4 mg total) by mouth every 8 (eight) hours as needed for nausea or vomiting. 11/05/22   Caleen Burgess BROCKS, MD  ondansetron  (ZOFRAN -ODT) 4 MG disintegrating tablet 4mg  ODT q4 hours prn nausea/vomit 04/17/23   Floyd, Dan, DO  predniSONE  (DELTASONE ) 20 MG tablet 2 tabs  po daily x 4 days 04/17/23   Floyd, Dan, DO  triamcinolone  cream (KENALOG ) 0.1 % Apply topically. 09/16/19   [provider]  valACYclovir  (VALTREX ) 1000 MG tablet TAKE 1 TABLET BY MOUTH TWICE A DAY AS NEEDED 07/07/23   Theophilus Andrews, Tully GRADE, MD    Current Outpatient Medications  Medication Sig Dispense Refill   azelastine  (ASTELIN ) 0.1 % nasal spray Place 2 sprays into both nostrils 2 (two) times daily. Use in each nostril as directed 30 mL 12   budesonide  (ENTOCORT EC ) 3 MG 24 hr capsule Take 3 by mouth for 4 weeks, then take 2 by mouth for 4 weeks, then take 1 by mouth for 1 week. 90 capsule 1   Calcium  Citrate-Vitamin D (CALCIUM  + D PO) Take 1 tablet by mouth 2 (two) times daily.     cetirizine  (  ZYRTEC ) 10 MG tablet Take 1 tablet (10 mg total) by mouth daily. 90 tablet 0   DULoxetine  (CYMBALTA ) 60 MG capsule Take 1 capsule (60 mg total) by mouth 2 (two) times daily. 180 capsule 0   fluticasone  (FLONASE ) 50 MCG/ACT nasal spray SPRAY 2 SPRAYS INTO EACH NOSTRIL EVERY DAY 48 mL 0   losartan  (COZAAR ) 100 MG tablet Take 1 tablet (100 mg total) by mouth daily. 90 tablet 0   Multiple Vitamin (MULTIVITAMIN WITH MINERALS) TABS tablet Take 1 tablet by mouth daily.     albuterol  (VENTOLIN  HFA) 108 (90 Base) MCG/ACT inhaler Inhale 2 puffs into the lungs every 6 (six) hours as needed for wheezing or shortness of breath. 8 g 6   dicyclomine  (BENTYL ) 20 MG tablet Take 1 tablet (20 mg total) by mouth 4 (four) times daily as needed for spasms. 360 tablet 3   diphenoxylate -atropine  (LOMOTIL ) 2.5-0.025 MG tablet TAKE 2 TABLETS BY MOUTH IN THE MORNING ,AT NOON, AND AT BEDTIME AS DIRECTED 60 tablet 3   furosemide  (LASIX ) 20 MG tablet TAKE 1 TABLET (20 MG TOTAL) BY MOUTH DAILY AS NEEDED FOR EDEMA. 90 tablet 0   mupirocin  ointment (BACTROBAN ) 2 % Apply 1 Application topically.     ondansetron  (ZOFRAN ) 4 MG tablet Take 1 tablet (4 mg total) by mouth every 8 (eight) hours as needed for nausea or vomiting. 90  tablet 0   ondansetron  (ZOFRAN -ODT) 4 MG disintegrating tablet 4mg  ODT q4 hours prn nausea/vomit 20 tablet 0   predniSONE  (DELTASONE ) 20 MG tablet 2 tabs po daily x 4 days 8 tablet 0   triamcinolone  cream (KENALOG ) 0.1 % Apply topically.     valACYclovir  (VALTREX ) 1000 MG tablet TAKE 1 TABLET BY MOUTH TWICE A DAY AS NEEDED 180 tablet 0   Current Facility-Administered Medications  Medication Dose Route Frequency Provider Last Rate Last Admin   0.9 %  sodium chloride  infusion  500 mL Intravenous Once Suzann April HERO, MD        Allergies as of 07/29/2023 - Review Complete 07/29/2023  Allergen Reaction Noted   Aspirin Other (See Comments) 04/09/2018   Latex Other (See Comments) 04/09/2018   Other Itching 07/28/2018   Adhesive [tape] Other (See Comments) 10/08/2018   Ciprofloxacin Nausea And Vomiting 07/28/2018   Codeine Nausea And Vomiting 04/09/2018   Demerol [meperidine hcl] Nausea And Vomiting 06/30/2018   Fentanyl  Nausea And Vomiting 05/02/2021   Lactose intolerance (gi) Other (See Comments) 06/30/2018   Prednisone  Other (See Comments) 06/27/2021   Septra [sulfamethoxazole-trimethoprim] Nausea Only 04/09/2018    Family History  Problem Relation Age of Onset   Diabetes Father    Parkinson's disease Father    Colon cancer Other    Asthma Neg Hx    Esophageal cancer Neg Hx    Pancreatic cancer Neg Hx    Liver disease Neg Hx    Stomach cancer Neg Hx     Social History   Socioeconomic History   Marital status: Married    Spouse name: Not on file   Number of children: 3   Years of education: Not on file   Highest education level: Not on file  Occupational History   Not on file  Tobacco Use   Smoking status: Former    Current packs/day: 0.00    Average packs/day: 1 pack/day for 30.0 years (30.0 ttl pk-yrs)    Types: Cigarettes    Start date: 31    Quit date: 45    Years since quitting:  35.5   Smokeless tobacco: Never  Vaping Use   Vaping status: Never Used   Substance and Sexual Activity   Alcohol use: Not Currently   Drug use: Never   Sexual activity: Not Currently    Birth control/protection: Post-menopausal  Other Topics Concern   Not on file  Social History Narrative   Not on file   Social Drivers of Health   Financial Resource Strain: Low Risk  (04/16/2022)   Overall Financial Resource Strain (CARDIA)    Difficulty of Paying Living Expenses: Not hard at all  Food Insecurity: Patient Declined (02/03/2023)   Hunger Vital Sign    Worried About Running Out of Food in the Last Year: Patient declined    Ran Out of Food in the Last Year: Patient declined  Transportation Needs: Patient Declined (02/03/2023)   PRAPARE - Administrator, Civil Service (Medical): Patient declined    Lack of Transportation (Non-Medical): Patient declined  Physical Activity: Insufficiently Active (04/16/2022)   Exercise Vital Sign    Days of Exercise per Week: 2 days    Minutes of Exercise per Session: 20 min  Stress: No Stress Concern Present (04/16/2022)   [employer] of Occupational Health - Occupational Stress Questionnaire    Feeling of Stress : Not at all  Social Connections: Patient Declined (02/03/2023)   Social Connection and Isolation Panel    Frequency of Communication with Friends and Family: Patient declined    Frequency of Social Gatherings with Friends and Family: Patient declined    Attends Religious Services: Patient declined    Database administrator or Organizations: Patient declined    Attends Banker Meetings: Patient declined    Marital Status: Patient declined  Recent Concern: Social Connections - Moderately Isolated (01/31/2023)   Social Connection and Isolation Panel    Frequency of Communication with Friends and Family: More than three times a week    Frequency of Social Gatherings with Friends and Family: Once a week    Attends Religious Services: Never    Database administrator or Organizations: No     Attends Banker Meetings: Never    Marital Status: Married  Catering manager Violence: Patient Declined (02/03/2023)   Humiliation, Afraid, Rape, and Kick questionnaire    Fear of Current or Ex-Partner: Patient declined    Emotionally Abused: Patient declined    Physically Abused: Patient declined    Sexually Abused: Patient declined    Review of Systems:  All other review of systems negative except as mentioned in the HPI.  Physical Exam: Vital signs BP (!) 164/89   Pulse 67   Temp 98.2 F (36.8 C) (Skin)   Resp 12   Ht 5' (1.524 m)   Wt 110 lb (49.9 kg)   SpO2 100%   BMI 21.48 kg/m   General:   Alert,  Well-developed, well-nourished, pleasant and cooperative in NAD Airway:  Mallampati 2 Lungs:  Clear throughout to auscultation.   Heart:  Regular rate and rhythm; no murmurs, clicks, rubs,  or gallops. Abdomen:  Soft, nontender and nondistended. Normal bowel sounds.   Neuro/Psych:  Normal mood and affect. A and O x 3  April Hausen, MD [facility]

## 2023-07-29 ENCOUNTER — Other Ambulatory Visit: Payer: Self-pay | Admitting: Pediatrics

## 2023-07-29 ENCOUNTER — Encounter: Payer: Self-pay | Admitting: Pediatrics

## 2023-07-29 ENCOUNTER — Ambulatory Visit: Admitting: Pediatrics

## 2023-07-29 VITALS — BP 129/56 | HR 66 | Temp 98.2°F | Resp 14 | Ht 60.0 in | Wt 110.0 lb

## 2023-07-29 DIAGNOSIS — Q399 Congenital malformation of esophagus, unspecified: Secondary | ICD-10-CM

## 2023-07-29 DIAGNOSIS — K50919 Crohn's disease, unspecified, with unspecified complications: Secondary | ICD-10-CM

## 2023-07-29 DIAGNOSIS — R1310 Dysphagia, unspecified: Secondary | ICD-10-CM | POA: Diagnosis not present

## 2023-07-29 DIAGNOSIS — Z1211 Encounter for screening for malignant neoplasm of colon: Secondary | ICD-10-CM

## 2023-07-29 DIAGNOSIS — K529 Noninfective gastroenteritis and colitis, unspecified: Secondary | ICD-10-CM

## 2023-07-29 DIAGNOSIS — K2100 Gastro-esophageal reflux disease with esophagitis, without bleeding: Secondary | ICD-10-CM

## 2023-07-29 DIAGNOSIS — T18118A Gastric contents in esophagus causing other injury, initial encounter: Secondary | ICD-10-CM

## 2023-07-29 DIAGNOSIS — K6289 Other specified diseases of anus and rectum: Secondary | ICD-10-CM

## 2023-07-29 DIAGNOSIS — K219 Gastro-esophageal reflux disease without esophagitis: Secondary | ICD-10-CM

## 2023-07-29 DIAGNOSIS — D128 Benign neoplasm of rectum: Secondary | ICD-10-CM

## 2023-07-29 DIAGNOSIS — K635 Polyp of colon: Secondary | ICD-10-CM | POA: Diagnosis not present

## 2023-07-29 DIAGNOSIS — D509 Iron deficiency anemia, unspecified: Secondary | ICD-10-CM

## 2023-07-29 DIAGNOSIS — K5010 Crohn's disease of large intestine without complications: Secondary | ICD-10-CM

## 2023-07-29 DIAGNOSIS — K56609 Unspecified intestinal obstruction, unspecified as to partial versus complete obstruction: Secondary | ICD-10-CM | POA: Diagnosis not present

## 2023-07-29 DIAGNOSIS — K209 Esophagitis, unspecified without bleeding: Secondary | ICD-10-CM

## 2023-07-29 DIAGNOSIS — R197 Diarrhea, unspecified: Secondary | ICD-10-CM

## 2023-07-29 MED ORDER — SODIUM CHLORIDE 0.9 % IV SOLN: 500.0000 mL | Freq: Once | INTRAVENOUS | Status: DC

## 2023-07-29 MED ORDER — OMEPRAZOLE 40 MG PO CPDR: 40.0000 mg | DELAYED_RELEASE_CAPSULE | Freq: Every day | ORAL | 3 refills | Status: AC

## 2023-07-29 MED ORDER — DIPHENOXYLATE-ATROPINE 2.5-0.025 MG PO TABS: 2.0000 | ORAL_TABLET | Freq: Three times a day (TID) | ORAL | 11 refills | Status: DC

## 2023-07-29 NOTE — Op Note (Signed)
 [facility] Patient Name: [name] Procedure Date: 07/29/2023 11:18 AM MRN: [id number] Endoscopist: Inocente Hausen , MD, [id number] Age: 78 Referring MD:  Date of Birth: April 04, 1945 Gender: Female Account #: [id number] Procedure:                Upper GI endoscopy Indications:              Dysphagia, Follow-up of gastro-esophageal reflux                            disease, Diarrhea, history of paraesophageal hernia                            repair, history of Crohn's colitis, nausea,                            abdominal pain and gas Medicines:                Monitored Anesthesia Care Procedure:                Pre-Anesthesia Assessment:                           - Prior to the procedure, a History and Physical                            was performed, and patient medications and                            allergies were reviewed. The patient's tolerance of                            previous anesthesia was also reviewed. The risks                            and benefits of the procedure and the sedation                            options and risks were discussed with the patient.                            All questions were answered, and informed consent                            was obtained. Prior Anticoagulants: The patient has                            taken no anticoagulant or antiplatelet agents. ASA                            Grade Assessment: II - A patient with mild systemic                            disease. After reviewing the risks and benefits,  the patient was deemed in satisfactory condition to                            undergo the procedure.                           After obtaining informed consent, the endoscope was                            passed under direct vision. Throughout the                            procedure, the patient's blood pressure, pulse, and                            oxygen saturations were monitored  continuously. The                            GIF [id number] #[id number] was introduced through the                            mouth, and advanced to the second part of duodenum.                            The upper GI endoscopy was accomplished without                            difficulty. The patient tolerated the procedure                            well. Scope In: Scope Out: Findings:                 The examined esophagus was moderately tortuous.                           LA Grade A (one or more mucosal breaks less than 5                            mm, not extending between tops of 2 mucosal folds)                            esophagitis with no bleeding was found.                           A guidewire was placed and the scope was withdrawn.                            Dilation was performed in the entire esophagus with                            a Savary dilator with no resistance at 14 mm, 15 mm  and 16 mm.                           The gastric body, gastric antrum, cardia (on                            retroflexion) and gastric fundus (on retroflexion)                            were normal. Noted that there was a small amount of                            retained fibrous/vegetable material in the stomach.                            There did not appear to be any evidence of residual                            paraesophageal hernia. Biopsies were taken with a                            cold forceps for Helicobacter pylori testing.                           The duodenal bulb and second portion of the                            duodenum were normal. Biopsies for histology were                            taken with a cold forceps for evaluation of celiac                            disease. Complications:            No immediate complications. Estimated blood loss:                            Minimal. Estimated Blood Loss:     Estimated blood loss was  minimal. Impression:               - Tortuous esophagus. Dilation performed in the                            entire esophagus. No evidence of residual                            paraesophageal hernia.                           - LA Grade A esophagitis with no bleeding.                           - Normal gastric body, antrum, cardia and gastric  fundus. Biopsied.                           - A small amount of retained fibrous/vegetable                            material was present in the stomach. No documented                            history of gastroparesis.                           - Normal duodenal bulb and second portion of the                            duodenum. Biopsied.                           - Tortuosity of esophagus and/or esophagitis could                            be contributing to patient's reported dysphagia. An                            esophageal motility disorder remains in the                            differential diagnosis. Recommendation:           - Await pathology results.                           - Start once daily PPI given finding of mild                            esophagitis in distal esophagus. Will discuss with                            patient which PPIs she had previously used in the                            past.                           - Perform a colonoscopy today.                           - The findings and recommendations were discussed                            with the patient's family. Inocente Hausen, MD 07/29/2023 12:26:01 PM This report has been signed electronically.

## 2023-07-29 NOTE — Progress Notes (Signed)
 To pacu, VSS. Report to [email or web address]

## 2023-07-29 NOTE — Progress Notes (Signed)
 Called to room to assist during endoscopic procedure.  Patient ID and intended procedure confirmed with present staff. Received instructions for my participation in the procedure from the performing physician.

## 2023-07-29 NOTE — Progress Notes (Signed)
 Pt's states no medical or surgical changes since previsit or office visit.

## 2023-07-29 NOTE — Op Note (Signed)
 [facility] Patient Name: [name] Procedure Date: 07/29/2023 11:12 AM MRN: [id number] Endoscopist: Inocente Hausen , MD, [id number] Age: 78 Referring MD:  Date of Birth: 21-Aug-1945 Gender: Female Account #: [id number] Procedure:                Colonoscopy Indications:              Last colonoscopy: November 2022, Abdominal pain,                            Disease activity assessment of Crohn's disease of                            the colon, Chronic diarrhea, anemia. Medicines:                Monitored Anesthesia Care Procedure:                Pre-Anesthesia Assessment:                           - Prior to the procedure, a History and Physical                            was performed, and patient medications and                            allergies were reviewed. The patient's tolerance of                            previous anesthesia was also reviewed. The risks                            and benefits of the procedure and the sedation                            options and risks were discussed with the patient.                            All questions were answered, and informed consent                            was obtained. Prior Anticoagulants: The patient has                            taken no anticoagulant or antiplatelet agents. ASA                            Grade Assessment: II - A patient with mild systemic                            disease. After reviewing the risks and benefits,                            the patient was deemed in satisfactory condition to  undergo the procedure.                           After obtaining informed consent, the colonoscope                            was passed under direct vision. Throughout the                            procedure, the patient's blood pressure, pulse, and                            oxygen saturations were monitored continuously.The                            colonoscopy was  performed without difficulty. The                            patient tolerated the procedure well. The quality                            of the bowel preparation was adequate. The terminal                            ileum, ileocecal valve, appendiceal orifice, and                            rectum were photographed. The PCF-H190TL Slim SN                            [id number] was introduced through the anus and                            advanced to the terminal ileum. The colonoscope was                            changed from a pediatric colonoscope to the                            ultraslim pediatric colonoscope during the                            procedure in order to traverse the ascending colon                            stenosis. Scope In: 11:58:06 AM Scope Out: 12:18:24 PM Scope Withdrawal Time: 0 hours 17 minutes 32 seconds  Total Procedure Duration: 0 hours 20 minutes 18 seconds  Findings:                 The perianal examination was normal.                           The digital rectal exam findings include decreased  sphincter tone.                           Mucosa throughout the colon appeared diffusely                            atrophic and scarred. There did not appear to be                            evidence of significantly active inflammation                            endoscopically.                           A benign-appearing, intrinsic moderate stenosis                            measuring 4 cm (in length) was found in the                            ascending colon and was traversed after changing                            the colonoscope from a pediatric colonoscope to an                            ultrathin colonoscope. The stenosis did not appear                            overtly inflamed and did not demonstrate nodularity.                           The ileocecal region proximal to the ascending                            colon  stenosis appeared somewhat distorted. The                            ileocecal valve was wide open and gaping.                           The terminal ileum appeared normal. No evidence of                            active inflammation.                           Biopsies were taken with a cold forceps in the                            entire colon, ascending colon stenosis and in the                            terminal ileum for histology.  A 5 mm polyp was found in the rectum. The polyp was                            sessile. Polypectomy was attempted, initially using                            a cold snare. Polyp resection was incomplete with                            this device. This intervention then required a                            different device and polypectomy technique. The                            polyp was removed with a cold biopsy forceps.                            Resection and retrieval were complete.                           The retroflexed view of the distal rectum and anal                            verge was normal and showed no anal or rectal                            abnormalities. Complications:            No immediate complications. Estimated blood loss:                            Minimal. Estimated Blood Loss:     Estimated blood loss was minimal. Impression:               - Decreased sphincter tone found on digital rectal                            exam.                           - The colonic mucosa appeared diffusely atrophic                            and scarred but without significantly active                            inflammation endoscopically. Biopsied. Rule out                            active IBD, microscopic colitis and dysplasia.                           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 Biopsied?"rule  out dysplasia.                           - The examined portion of the ileum was  normal.                            Biopsied.                           - One 5 mm polyp in the rectum, removed with a cold                            biopsy forceps. Resected and retrieved.                           - The distal rectum and anal verge are normal on                            retroflexion view. Recommendation:           - Discharge patient to home (ambulatory).                           - Await pathology results.                           - The findings and recommendations were discussed                            with the patient's family.                           - Patient has a contact number available for                            emergencies. The signs and symptoms of potential                            delayed complications were discussed with the                            patient. Return to normal activities tomorrow.                            Written discharge instructions were provided to the                            patient. Inocente Hausen, MD 07/29/2023 12:41:52 PM This report has been signed electronically.

## 2023-07-29 NOTE — Patient Instructions (Addendum)
 Educational handout provided to patient related to Polyps and Esophagitis  Resume previous diet  Continue present medications  Awaiting pathology results  YOU HAD AN ENDOSCOPIC PROCEDURE TODAY AT THE [facility]:   Refer to the procedure report that was given to you for any specific questions about what was found during the examination.  If the procedure report does not answer your questions, please call your gastroenterologist to clarify.  If you requested that your care partner not be given the details of your procedure findings, then the procedure report has been included in a sealed envelope for you to review at your convenience later.  YOU SHOULD EXPECT: Some feelings of bloating in the abdomen. Passage of more gas than usual.  Walking can help get rid of the air that was put into your GI tract during the procedure and reduce the bloating. If you had a lower endoscopy (such as a colonoscopy or flexible sigmoidoscopy) you may notice spotting of blood in your stool or on the toilet paper. If you underwent a bowel prep for your procedure, you may not have a normal bowel movement for a few days.  Please Note:  You might notice some irritation and congestion in your nose or some drainage.  This is from the oxygen used during your procedure.  There is no need for concern and it should clear up in a day or so.  SYMPTOMS TO REPORT IMMEDIATELY:  Following lower endoscopy (colonoscopy or flexible sigmoidoscopy):  Excessive amounts of blood in the stool  Significant tenderness or worsening of abdominal pains  Swelling of the abdomen that is new, acute  Fever of 100F or higher  Following upper endoscopy (EGD)  Vomiting of blood or coffee ground material  New chest pain or pain under the shoulder blades  Painful or persistently difficult swallowing  New shortness of breath  Fever of 100F or higher  Black, tarry-looking stools  For urgent or emergent issues, a gastroenterologist  can be reached at any hour by calling [phone]. Do not use MyChart messaging for urgent concerns.    DIET:  We do recommend a small meal at first, but then you may proceed to your regular diet.  Drink plenty of fluids but you should avoid alcoholic beverages for 24 hours.  ACTIVITY:  You should plan to take it easy for the rest of today and you should NOT DRIVE or use heavy machinery until tomorrow (because of the sedation medicines used during the test).    FOLLOW UP: Our staff will call the number listed on your records the next business day following your procedure.  We will call around 7:15- 8:00 am to check on you and address any questions or concerns that you may have regarding the information given to you following your procedure. If we do not reach you, we will leave a message.     If any biopsies were taken you will be contacted by phone or by letter within the next 1-3 weeks.  Please call us  at [phone] if you have not heard about the biopsies in 3 weeks.    SIGNATURES/CONFIDENTIALITY: You and/or your care partner have signed paperwork which will be entered into your electronic medical record.  These signatures attest to the fact that that the information above on your After Visit Summary has been reviewed and is understood.  Full responsibility of the confidentiality of this discharge information lies with you and/or your care-partner.

## 2023-07-30 ENCOUNTER — Telehealth: Payer: Self-pay

## 2023-07-30 NOTE — Telephone Encounter (Signed)
 Attempted to reach patient for follow up phone call. No answer, left voicemail to contact Dr. Andy office with any questions or concerns.

## 2023-07-31 ENCOUNTER — Ambulatory Visit (INDEPENDENT_AMBULATORY_CARE_PROVIDER_SITE_OTHER): Admitting: Otolaryngology

## 2023-07-31 ENCOUNTER — Encounter (INDEPENDENT_AMBULATORY_CARE_PROVIDER_SITE_OTHER): Payer: Self-pay | Admitting: Otolaryngology

## 2023-07-31 VITALS — BP 159/67 | HR 68 | Ht 60.0 in | Wt 108.0 lb

## 2023-07-31 DIAGNOSIS — J342 Deviated nasal septum: Secondary | ICD-10-CM

## 2023-07-31 DIAGNOSIS — J3089 Other allergic rhinitis: Secondary | ICD-10-CM | POA: Diagnosis not present

## 2023-07-31 DIAGNOSIS — R0981 Nasal congestion: Secondary | ICD-10-CM

## 2023-07-31 DIAGNOSIS — H6992 Unspecified Eustachian tube disorder, left ear: Secondary | ICD-10-CM

## 2023-07-31 DIAGNOSIS — H938X2 Other specified disorders of left ear: Secondary | ICD-10-CM | POA: Diagnosis not present

## 2023-07-31 DIAGNOSIS — J3489 Other specified disorders of nose and nasal sinuses: Secondary | ICD-10-CM

## 2023-07-31 DIAGNOSIS — H6522 Chronic serous otitis media, left ear: Secondary | ICD-10-CM | POA: Diagnosis not present

## 2023-07-31 DIAGNOSIS — H90A32 Mixed conductive and sensorineural hearing loss, unilateral, left ear with restricted hearing on the contralateral side: Secondary | ICD-10-CM

## 2023-07-31 LAB — SURGICAL PATHOLOGY

## 2023-07-31 MED ORDER — OFLOXACIN 0.3 % OT SOLN: 4.0000 [drp] | Freq: Two times a day (BID) | OTIC | 0 refills | Status: AC

## 2023-07-31 NOTE — Patient Instructions (Signed)
 Use ofloxacin  drops 4 drops twice daily for 5 days

## 2023-07-31 NOTE — Progress Notes (Signed)
 Dear Dr. Theophilus Andrews, Here is my assessment for our mutual patient, [name]. Thank you for allowing me the opportunity to care for your patient. Please do not hesitate to contact me should you have any other questions. Sincerely, Dr. Eldora Blanch  Otolaryngology Clinic Note Referring provider: Dr. Theophilus Andrews HPI:  [name] is a 78 y.o. female kindly referred by Dr. Theophilus Andrews for evaluation of hearing loss and ear fullness  Initial visit (05/2023):  Patient reports: Bilateral progressive hearing loss, left worse than right. Left fullness/ETD symptoms, but denies antecedent event including URI, infection or trauma. Has tried Nasal sprays - tried flonase , did not help. Mild benefit with zyrtec . Does have typical AR symptoms --- all the time. Sneezing, itching nose with PND. No CRS sx or frequent sinus infections.. Does wear HA for gradual onset b/l HL Of note, she does not remember prior myringotomy but prior chart review shows long intermittent left ear problem by Dr. Ethyl with prior myringotomy before moving to [place].  On budesonide  for Crohn's  Patient denies: ear pain, vertigo, drainage, tinnitus Patient also denies barotrauma, vestibular suppressant use, ototoxic medication use Prior ear surgery: denies No significant noise exposure  --------------------------------------------------------- 07/31/2023 She reports that her ear is no better. Continues to have left ear fullness and ETD symptoms. Cannot pop left ear. On flonase  and astelin  without improvement from ear standpoint, but this is improving her nasal breathing. She continues to have some bilateral nasal congestion and typical AR symptoms. She does say that after thinking about it more, she had septum surgery (broke bones) in nose few years ago for her breathing.  No ear pain, drainage, tinnitus. No headaches, retroorbital pain We did discuss her CT   H&N Surgery: Tonsillectomy, prior myringotomy and  sinus surgery ([place]?) Personal or FHx of bleeding dz or anesthesia difficulty: no  GLP-1: no AP/AC: no  Tobacco: prior, quit ~1990 (30 pack year)  PMHx: Crohns, Emphysema, RA, HTN, CKD  Independent Review of Additional Tests or Records:  Dr. 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 Ethyl notes (2021): scar inferiorly along left TM, Dx: ETD and HL; Rx: flonase , irrigation, briefly discussed left tube but does not wish for it. CBC, CMP 04/28/2023: WBC 10.6, Eos 200; BUN/Cr 50/2.08 [facility] 11/11/2021 independently interpreted with respect to ears: cuts thick so suboptimal eval but hyperpneumatized mastoids with clear mastoids and ME; no obvious ossicular chain or otic capsule abnormality noted 06/2023 Audiogram was independently reviewed and interpreted by me and it reveals - noted right mild to then mod/sev SNHL with left mixed HL mod/sev with ABG ~20-30 dB; tymps: B AS, A AD   SNHL= Sensorineural hearing loss   CT Temporal bones 06/18/2023 independently interpreted: normal right temporal bone; on left, noted left mastoid effusion and the petrous apex with some bony expansio; some opacification posterior attic - most likely fluid given does not appear to have any ossicular chain erosion; ME on left fairly well aerated; scutum intact; tegmen thin on left - no obvious large dehiscence noted  PMH/Meds/All/SocHx/FamHx/ROS:   Past Medical History:  Diagnosis Date   Allergies    Anemia    Bruises easily    Complication of anesthesia    Crohn's disease (HCC)    GI Dr Miquel in [place], [place]     Deviated septum    GERD (gastroesophageal reflux disease)    History of hiatal hernia    HTN (hypertension)    pcp Dr Evalene Polo  in [place], [place]      Kidney disease, chronic, stage III (GFR 30-59 ml/min) (HCC)    Neuropathy    fingers,periodically   Osteoarthritis    Pancreatic insufficiency    PONV (postoperative nausea and vomiting)    Scoliosis    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   Whooping cough 2015   Whooping cough with pneumonia    2017     Past Surgical History:  Procedure Laterality Date   BIOPSY  11/05/2022   Procedure: BIOPSY;  Surgeon: Albertus Gordy HERO, MD;  Location: [facility] ENDOSCOPY;  Service: Gastroenterology;;   CATARACT EXTRACTION, BILATERAL  2018   with IOC implant    COLONOSCOPY     DEBRIDEMENT AND CLOSURE WOUND Right 10/01/2018   Procedure: Excision of right knee wound with placement of ACell and Pravena;  Surgeon: Lowery Estefana RAMAN, DO;  Location: [facility];  Service: Plastics;  Laterality: Right;  30 min   ESOPHAGOGASTRODUODENOSCOPY (EGD) WITH PROPOFOL  N/A 11/05/2022   Procedure: ESOPHAGOGASTRODUODENOSCOPY (EGD) WITH PROPOFOL ;  Surgeon: Albertus Gordy HERO, MD;  Location: [facility] ENDOSCOPY;  Service: Gastroenterology;  Laterality: N/A;   HERNIA REPAIR     knee fracture  Right    NASAL FRACTURE SURGERY     needed b/c she was getting frequent sinus infections    TONSILLECTOMY     TOTAL KNEE ARTHROPLASTY Right 07/28/2018   Procedure: TOTAL KNEE ARTHROPLASTY;  Surgeon: Ernie Cough, MD;  Location: [facility];  Service: Orthopedics;  Laterality: Right;   TOTAL KNEE ARTHROPLASTY Left 03/02/2019   Procedure: TOTAL KNEE ARTHROPLASTY;  Surgeon: Ernie Cough, MD;  Location: [facility];  Service: Orthopedics;  Laterality: Left;  70 mins   UPPER GASTROINTESTINAL ENDOSCOPY     XI ROBOTIC ASSISTED HIATAL HERNIA REPAIR N/A 01/31/2023   Procedure: XI ROBOTIC ASSISTED HIATAL HERNIA REPAIR WITH MESH;  Surgeon: Stechschulte, Deward PARAS, MD;  Location: [facility];  Service: General;  Laterality: N/A;    Family History  Problem Relation Age of Onset   Diabetes Father    Parkinson's disease Father    Colon cancer Other     Asthma Neg Hx    Esophageal cancer Neg Hx    Pancreatic cancer Neg Hx    Liver disease Neg Hx    Stomach cancer Neg Hx      Social Connections: Patient Declined (02/03/2023)   Social Connection and Isolation Panel    Frequency of Communication with Friends and Family: Patient declined    Frequency of Social Gatherings with Friends and Family: Patient declined    Attends Religious Services: Patient declined    Database administrator or Organizations: Patient declined    Attends Banker Meetings: Patient declined    Marital Status: Patient declined  Recent Concern: Social Connections - Moderately Isolated (01/31/2023)   Social Connection and Isolation Panel    Frequency of Communication with Friends and Family: More than three times a week    Frequency of Social Gatherings with Friends and Family: Once a week    Attends Religious Services: Never    Database administrator or Organizations: No    Attends Banker Meetings: Never    Marital Status: Married      Current Outpatient Medications:    ofloxacin  (  FLOXIN ) 0.3 % OTIC solution, Place 4 drops into both ears 2 (two) times daily for 5 days., Disp: 5 mL, Rfl: 0   albuterol  (VENTOLIN  HFA) 108 (90 Base) MCG/ACT inhaler, Inhale 2 puffs into the lungs every 6 (six) hours as needed for wheezing or shortness of breath., Disp: 8 g, Rfl: 6   azelastine  (ASTELIN ) 0.1 % nasal spray, Place 2 sprays into both nostrils 2 (two) times daily. Use in each nostril as directed, Disp: 30 mL, Rfl: 12   budesonide  (ENTOCORT EC ) 3 MG 24 hr capsule, Take 3 by mouth for 4 weeks, then take 2 by mouth for 4 weeks, then take 1 by mouth for 1 week., Disp: 90 capsule, Rfl: 1   Calcium  Citrate-Vitamin D (CALCIUM  + D PO), Take 1 tablet by mouth 2 (two) times daily., Disp: , Rfl:    cetirizine  (ZYRTEC ) 10 MG tablet, Take 1 tablet (10 mg total) by mouth daily., Disp: 90 tablet, Rfl: 0   dicyclomine  (BENTYL ) 20 MG tablet, Take 1 tablet (20 mg  total) by mouth 4 (four) times daily as needed for spasms., Disp: 360 tablet, Rfl: 3   diphenoxylate -atropine  (LOMOTIL ) 2.5-0.025 MG tablet, Take 2 tablets by mouth 3 (three) times daily. TAKE 2 TABLETS BY MOUTH IN THE MORNING ,AT NOON, AND AT BEDTIME AS DIRECTED, Disp: 180 tablet, Rfl: 11   DULoxetine  (CYMBALTA ) 60 MG capsule, Take 1 capsule (60 mg total) by mouth 2 (two) times daily., Disp: 180 capsule, Rfl: 0   fluticasone  (FLONASE ) 50 MCG/ACT nasal spray, SPRAY 2 SPRAYS INTO EACH NOSTRIL EVERY DAY, Disp: 48 mL, Rfl: 0   furosemide  (LASIX ) 20 MG tablet, TAKE 1 TABLET (20 MG TOTAL) BY MOUTH DAILY AS NEEDED FOR EDEMA., Disp: 90 tablet, Rfl: 0   losartan  (COZAAR ) 100 MG tablet, Take 1 tablet (100 mg total) by mouth daily., Disp: 90 tablet, Rfl: 0   Multiple Vitamin (MULTIVITAMIN WITH MINERALS) TABS tablet, Take 1 tablet by mouth daily., Disp: , Rfl:    mupirocin  ointment (BACTROBAN ) 2 %, Apply 1 Application topically., Disp: , Rfl:    omeprazole  (PRILOSEC) 40 MG capsule, Take 1 capsule (40 mg total) by mouth daily., Disp: 90 capsule, Rfl: 3   ondansetron  (ZOFRAN ) 4 MG tablet, Take 1 tablet (4 mg total) by mouth every 8 (eight) hours as needed for nausea or vomiting., Disp: 90 tablet, Rfl: 0   ondansetron  (ZOFRAN -ODT) 4 MG disintegrating tablet, 4mg  ODT q4 hours prn nausea/vomit, Disp: 20 tablet, Rfl: 0   predniSONE  (DELTASONE ) 20 MG tablet, 2 tabs po daily x 4 days, Disp: 8 tablet, Rfl: 0   triamcinolone  cream (KENALOG ) 0.1 %, Apply topically., Disp: , Rfl:    valACYclovir  (VALTREX ) 1000 MG tablet, TAKE 1 TABLET BY MOUTH TWICE A DAY AS NEEDED, Disp: 180 tablet, Rfl: 0   Physical Exam:   BP (!) 159/67 (BP Location: Right Arm, Patient Position: Sitting, Cuff Size: Normal)   Pulse 68   Ht 5' (1.524 m)   Wt 108 lb (49 kg)   SpO2 (!) 89%   BMI 21.09 kg/m   Salient findings:  CN II-XII intact Given history and complaints, ear microscopy was indicated and performed for evaluation with findings as  below in physical exam section and in procedures. After significant discussion re: R/B/A, thereafter, decision was made to perform left myringotomy and tube placement. On exam, Bilateral EAC clear; left TM with serous effusion, anterior monomeric area; right TM intact with well aerated ME Weber 512: left Rinne 512: AC >  BC b/l Anterior rhinoscopy: Septum mild dev right; bilateral inferior turbinates without significant hypertrophy; Nasal endoscopy was indicated to better evaluate the nose and paranasal sinuses, given the patient's history and exam findings, and is detailed below. No lesions of oral cavity/oropharynx No obviously palpable neck masses/lymphadenopathy/thyromegaly No respiratory distress or stridor   Seprately Identifiable Procedures:  Prior to initiating any procedures, risks/benefits/alternatives were explained to the patient and verbal consent obtained. PROCEDURE: Bilateral Diagnostic Rigid Nasal Endoscopy Pre-procedure diagnosis: Nasal obstruction, nasal congestion Post-procedure diagnosis: same Indication: See pre-procedure diagnosis and physical exam above Complications: None apparent EBL: 0 mL Anesthesia: Lidocaine  4% and topical decongestant was topically sprayed in each nasal cavity  Description of Procedure:  Patient was identified. A rigid 30 degree endoscope was utilized to evaluate the sinonasal cavities, mucosa, sinus ostia and turbinates and septum.  Overall, signs of mucosal inflammation are not noted.  Also noted are left dorsal septum with a concavity with exposed cartilage(?), small amount of crust surrounding - likely site of prior surgery?; no masses over bilateral ET orifices.  No mucopurulence, polyps, or masses noted.   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 female with pre-procedure diagnoses above. We discussed options: 1. Observation 2. Nasal sprays 3. Myringotomy only v/s Tympanostomy tube. Risks discussed, patient opted for tympanostomy tube placement.  We had a long discussion about benefits and risks of Tympanostomy tube placement including pain, bleeding, infection, early or late extrusion, CSF otorrhea (if CSF) with risk of meningitis TM perforation, otorrhea, injury to middle or external ear structures, nature of tympanostomy tubes, cholesteatoma, hearing loss, among others. Written Consent was obtained prior to proceeding.  Findings: Left ear: serous effusion Successful tympanostomy tube placement inferiorly  Complications: None apparent  Procedure details: Patient was placed semi recumbent on the exam chair. The left ear was addressed first with binocular microscopy and any cerumen was cleaned from the ear canal. The tympanic membrane was visualized, with findings as above. A focal spot over the inferior TM was anesthetized with topical phenol (just posterior to the monomeric area). A myringotomy incision was then made radially. Serous effusion was encountered. The middle ear cleft was suctioned. The PE tube was placed and ciprodex drops were applied. A cotton ball was placed at the meatus.  Patient tolerated the procedure well    Impression & Plans:  Boston Catarino is a 78 y.o. female with:  1. Left chronic serous otitis media   2. Dysfunction of left eustachian tube   3. Sensation of fullness in left ear   4. Perennial allergic rhinitis   5. Mixed conductive and sensorineural hearing loss of left ear with restricted hearing of right ear   6. Nasal obstruction   7. Nasal congestion   8. Nasal septal deviation    She does not remember left tymp tube but appears  to have long-standing issues on left ear; noted B tymp with serous effusion despite medical management; endo without ET mass; Temporal bone without obvious large tegmen dehiscence - given appearance and PE, do not think cholesteatoma; no Gradenigo triad We discussed options and ultimately decided on left tymp tube placement - Ofloxacin  drops x5d - Advised water   precautions - f/u 8 weeks with audio  From nasal standpoint, wonder if the concavity on left is due to prior procedure. Not infected, no AI sx. Will aggressively humidify nose and continue flonase  and astelin  since helping with her sx - Will avoid steroids given on budesonide  for crohn's - daily rinses - flonase  and astelin  bid - recheck 8 weeks  See below regarding exact medications prescribed this encounter including dosages and route: Meds ordered this encounter  Medications   ofloxacin  (FLOXIN ) 0.3 % OTIC solution    Sig: Place 4 drops into both ears 2 (two) times daily for 5 days.    Dispense:  5 mL    Refill:  0      Thank you for allowing me the opportunity to care for your patient. Please do not hesitate to contact me should you have any other questions.  Sincerely, Eldora Blanch, MD Otolaryngologist (ENT), [facility] Phone: [phone] Fax: [phone]  07/31/2023, 12:14 PM   MDM:  Level 4 - 99214 Complexity/Problems addressed: mod - chronic issues, stable Data complexity: high - independent interpretation of CT - Morbidity: mod  - Drug prescribed or managed: y

## 2023-08-01 ENCOUNTER — Other Ambulatory Visit: Payer: Self-pay | Admitting: Internal Medicine

## 2023-08-06 ENCOUNTER — Ambulatory Visit: Payer: Self-pay | Admitting: Pediatrics

## 2023-08-07 ENCOUNTER — Telehealth: Payer: Self-pay

## 2023-08-07 ENCOUNTER — Telehealth: Payer: Self-pay | Admitting: Pediatrics

## 2023-08-07 NOTE — Telephone Encounter (Signed)
 I spoke on the telephone with [name] to review her recent results and future steps in care.  She prefers to defer and a trial of prednisone  citing previous ill effects.  She is amenable to moving forward with biologic therapy for management of her Crohn's disease and to preclude future complications.  Advised that I will review potential options of anti-IL 12/23 medications for future use and share them with her after she returns from her vacation next week.  We will schedule her for a laboratory draw for 7 alpha C4 to evaluate for bile acid diarrhea as well as a breath test to evaluate for SIBO.  Request sent to office for her to be scheduled for a clinic visit with me in August/September 2025.  Will also provide her with a refill of her budesonide -currently at 6 mg p.o. daily.

## 2023-08-07 NOTE — Telephone Encounter (Signed)
-----   Message from [name] sent at 08/07/2023  4:05 PM EDT ----- Regarding: Future Orders Hi Pod B  -  I spoke with [name] on the telephone today to discuss future tests we we will set up.  She is going on vacation for a week but when she gets back into town please have her come to the office for:  1.  Lab draw for [facility] 7alpha C4 (test code [phone]) blood test to evaluate for bile acid diarrhea/malabsorption  2.  Obtain a kit for breath testing for small bowel bacterial overgrowth syndrome  Her diagnoses are diarrhea, gas and bloating  Please also review the schedules to see if there is a time to set her up for an appointment with me in August or September 2025.  Thanks,  Inocente

## 2023-08-07 NOTE — Telephone Encounter (Signed)
 Good afternoon Dr. Suzann,   Patient called and stated she has read you results message below. States she has further questions and concerns that she would like to discuss further with you.   Thank you

## 2023-08-08 ENCOUNTER — Other Ambulatory Visit: Payer: Self-pay

## 2023-08-08 DIAGNOSIS — R140 Abdominal distension (gaseous): Secondary | ICD-10-CM

## 2023-08-08 DIAGNOSIS — R197 Diarrhea, unspecified: Secondary | ICD-10-CM

## 2023-08-08 DIAGNOSIS — R143 Flatulence: Secondary | ICD-10-CM

## 2023-08-08 NOTE — Telephone Encounter (Signed)
 Left message for patient to call back. Lab order placed & SIBO fax filled out. OV scheduled for 8/21 at 11:10 am with Dr. Suzann.

## 2023-08-08 NOTE — Telephone Encounter (Signed)
 Lmtcb

## 2023-08-08 NOTE — Telephone Encounter (Signed)
 Discussed MD recommendations with patient. She's been advised on when/where to go for labs. SIBO kit available on 2nd floor. She's been made aware of OV. Pt will come by to do labs & pick up kit after vacation.

## 2023-08-11 MED ORDER — FLUTICASONE PROPIONATE 50 MCG/ACT NA SUSP: NASAL | 0 refills | Status: DC

## 2023-08-11 NOTE — Addendum Note (Signed)
 Addended by: KATHRYNE MILLMAN B on: 08/11/2023 10:59 AM   Modules accepted: Orders

## 2023-08-15 NOTE — Telephone Encounter (Signed)
 Refill sent 08/07/23

## 2023-08-15 NOTE — Telephone Encounter (Signed)
 I spoke to [name] and she advised that she is still on 2 pills.  I advised her that I will send in the refill for her.  She said that she will be in Monday for her labs and to pick up her test kit on the 2nd floor.

## 2023-08-18 ENCOUNTER — Other Ambulatory Visit (INDEPENDENT_AMBULATORY_CARE_PROVIDER_SITE_OTHER)

## 2023-08-18 ENCOUNTER — Emergency Department (HOSPITAL_BASED_OUTPATIENT_CLINIC_OR_DEPARTMENT_OTHER)
Admission: EM | Admit: 2023-08-18 | Discharge: 2023-08-18 | Discharge status: Against Medical Advice | Attending: Emergency Medicine | Admitting: Emergency Medicine

## 2023-08-18 ENCOUNTER — Ambulatory Visit: Payer: Self-pay | Admitting: *Deleted

## 2023-08-18 ENCOUNTER — Encounter (HOSPITAL_BASED_OUTPATIENT_CLINIC_OR_DEPARTMENT_OTHER): Payer: Self-pay

## 2023-08-18 ENCOUNTER — Ambulatory Visit (HOSPITAL_BASED_OUTPATIENT_CLINIC_OR_DEPARTMENT_OTHER)

## 2023-08-18 ENCOUNTER — Other Ambulatory Visit: Payer: Self-pay

## 2023-08-18 DIAGNOSIS — R143 Flatulence: Secondary | ICD-10-CM

## 2023-08-18 DIAGNOSIS — M7989 Other specified soft tissue disorders: Secondary | ICD-10-CM | POA: Diagnosis present

## 2023-08-18 DIAGNOSIS — R140 Abdominal distension (gaseous): Secondary | ICD-10-CM

## 2023-08-18 DIAGNOSIS — Z5321 Procedure and treatment not carried out due to patient leaving prior to being seen by health care provider: Secondary | ICD-10-CM | POA: Insufficient documentation

## 2023-08-18 DIAGNOSIS — R197 Diarrhea, unspecified: Secondary | ICD-10-CM | POA: Diagnosis not present

## 2023-08-18 LAB — CBC WITH DIFFERENTIAL/PLATELET
Abs Immature Granulocytes: 0.02 K/uL (ref 0.00–0.07)
Basophils Absolute: 0.1 K/uL (ref 0.0–0.1)
Basophils Relative: 1 %
Eosinophils Absolute: 0.3 K/uL (ref 0.0–0.5)
Eosinophils Relative: 3 %
HCT: 32.1 % — ABNORMAL LOW (ref 36.0–46.0)
Hemoglobin: 10.2 g/dL — ABNORMAL LOW (ref 12.0–15.0)
Immature Granulocytes: 0 %
Lymphocytes Relative: 15 %
Lymphs Abs: 1.4 K/uL (ref 0.7–4.0)
MCH: 30 pg (ref 26.0–34.0)
MCHC: 31.8 g/dL (ref 30.0–36.0)
MCV: 94.4 fL (ref 80.0–100.0)
Monocytes Absolute: 0.5 K/uL (ref 0.1–1.0)
Monocytes Relative: 5 %
Neutro Abs: 7.4 K/uL (ref 1.7–7.7)
Neutrophils Relative %: 76 %
Platelets: 372 K/uL (ref 150–400)
RBC: 3.4 MIL/uL — ABNORMAL LOW (ref 3.87–5.11)
RDW: 14.6 % (ref 11.5–15.5)
WBC: 9.8 K/uL (ref 4.0–10.5)
nRBC: 0 % (ref 0.0–0.2)

## 2023-08-18 LAB — COMPREHENSIVE METABOLIC PANEL WITH GFR
ALT: 11 U/L (ref 0–44)
AST: 20 U/L (ref 15–41)
Albumin: 4 g/dL (ref 3.5–5.0)
Alkaline Phosphatase: 83 U/L (ref 38–126)
Anion gap: 11 (ref 5–15)
BUN: 41 mg/dL — ABNORMAL HIGH (ref 8–23)
CO2: 23 mmol/L (ref 22–32)
Calcium: 9.7 mg/dL (ref 8.9–10.3)
Chloride: 102 mmol/L (ref 98–111)
Creatinine, Ser: 1.41 mg/dL — ABNORMAL HIGH (ref 0.44–1.00)
GFR, Estimated: 38 mL/min — ABNORMAL LOW (ref >60)
Glucose, Bld: 109 mg/dL — ABNORMAL HIGH (ref 70–99)
Potassium: 5 mmol/L (ref 3.5–5.1)
Sodium: 136 mmol/L (ref 135–145)
Total Bilirubin: 0.2 mg/dL (ref 0.0–1.2)
Total Protein: 7.6 g/dL (ref 6.5–8.1)

## 2023-08-18 NOTE — Telephone Encounter (Signed)
 Spoke with pt, scheduled for tomorrow at 2:30 with [facility]

## 2023-08-18 NOTE — Telephone Encounter (Signed)
  FYI Only or Action Required?: FYI only for provider.  Patient was last seen in primary care on 04/22/2023 by April Chiquita SAUNDERS, DO.  Called Nurse Triage reporting Leg Swelling.  Symptoms began several days ago.  Interventions attempted: Other: attempted to go to [facility] ED and was not seen due to long wait times.  Symptoms are: unchanged.  Triage Disposition: See HCP Within 4 Hours (Or PCP Triage)  Patient/caregiver understands and will follow disposition?: No, wishes to speak with PCP                Copied from CRM #[id number]. Topic: Clinical - Red Word Triage >> Aug 18, 2023  4:09 PM Gennette ORN wrote: Red Word that prompted transfer to Nurse Triage: Patient is calling because she has a swollen leg it's on her right leg. Reason for Disposition  [1] Thigh or calf pain AND [2] only 1 side AND [3] present > 1 hour  Answer Assessment - Initial Assessment Questions Recommended to go to UC/ ED . Patient was at [facility] ED and left due to extended wait times. No chest pain no SOB no pain. Recommended to go to another ED and patient declined at this time. Please advise if patient can be seen tomorrow in office as requested. CAL notified patient declined ED.        1. ONSET: When did the swelling start? (e.g., minutes, hours, days)     Saturday  2. LOCATION: What part of the leg is swollen?  Are both legs swollen or just one leg?     Ankle to knee  3. SEVERITY: How bad is the swelling? (e.g., localized; mild, moderate, severe)     Moderate  4. REDNESS: Is there redness or signs of infection?     Redness  5. PAIN: Is the swelling painful to touch? If Yes, ask: How painful is it?   (Scale 1-10; mild, moderate or severe)     No  6. FEVER: Do you have a fever? If Yes, ask: What is it, how was it measured, and when did it start?      No  7. CAUSE: What do you think is causing the leg swelling?     Not sure  8. MEDICAL HISTORY: Do you have a history of blood clots  (e.g., DVT), cancer, heart failure, kidney disease, or liver failure?     No  9. RECURRENT SYMPTOM: Have you had leg swelling before? If Yes, ask: When was the last time? What happened that time?     Yes  10. OTHER SYMPTOMS: Do you have any other symptoms? (e.g., chest pain, difficulty breathing)       Calf swelling ankle to knee no pain , redness reported  11. PREGNANCY: Is there any chance you are pregnant? When was your last menstrual period?       na  Protocols used: Leg Swelling and Edema-A-AH

## 2023-08-18 NOTE — ED Triage Notes (Signed)
 Complaining of right leg swelling and redness on the calf. Started on Saturday. Denies any injury or pain with it.

## 2023-08-18 NOTE — Telephone Encounter (Signed)
  FYI Only or Action Required?: FYI only for provider.  Patient was last seen in primary care on 04/22/2023 by Luke Chiquita SAUNDERS, DO.  Called Nurse Triage reporting Leg Swelling.  Symptoms began several days ago.  Interventions attempted: Other: attempted to go to [facility] ED and was not seen due to long wait times.  Symptoms are: unchanged.  Triage Disposition: See HCP Within 4 Hours (Or PCP Triage)  Patient/caregiver understands and will follow disposition?: No, wishes to speak with PCP                Copied from CRM #[id number]. Topic: Clinical - Red Word Triage >> Aug 18, 2023  4:09 PM Gennette ORN wrote: Red Word that prompted transfer to Nurse Triage: Patient is calling because she has a swollen leg it's on her right leg. Reason for Disposition  [1] Thigh or calf pain AND [2] only 1 side AND [3] present > 1 hour  Answer Assessment - Initial Assessment Questions Recommended to go to UC/ ED . Patient was at [facility] ED and left due to extended wait times. No chest pain no SOB no pain. Recommended to go to another ED and patient declined at this time. Please advise if patient can be seen tomorrow in office as requested. CAL notified patient declined ED.        1. ONSET: When did the swelling start? (e.g., minutes, hours, days)     Saturday  2. LOCATION: What part of the leg is swollen?  Are both legs swollen or just one leg?     Ankle to knee  3. SEVERITY: How bad is the swelling? (e.g., localized; mild, moderate, severe)     Moderate  4. REDNESS: Is there redness or signs of infection?     Redness  5. PAIN: Is the swelling painful to touch? If Yes, ask: How painful is it?   (Scale 1-10; mild, moderate or severe)     No  6. FEVER: Do you have a fever? If Yes, ask: What is it, how was it measured, and when did it start?      No  7. CAUSE: What do you think is causing the leg swelling?     Not sure  8. MEDICAL HISTORY: Do you have a history of blood clots  (e.g., DVT), cancer, heart failure, kidney disease, or liver failure?     No  9. RECURRENT SYMPTOM: Have you had leg swelling before? If Yes, ask: When was the last time? What happened that time?     Yes  10. OTHER SYMPTOMS: Do you have any other symptoms? (e.g., chest pain, difficulty breathing)       Calf swelling ankle to knee no pain , redness reported  11. PREGNANCY: Is there any chance you are pregnant? When was your last menstrual period?       na  Protocols used: Leg Swelling and Edema-A-AH

## 2023-08-19 ENCOUNTER — Ambulatory Visit: Payer: Self-pay | Admitting: Adult Health

## 2023-08-19 ENCOUNTER — Ambulatory Visit (HOSPITAL_COMMUNITY)
Admission: RE | Admit: 2023-08-19 | Discharge: 2023-08-19 | Disposition: A | Discharge status: Home / Self Care | Source: Ambulatory Visit | Attending: Adult Health | Admitting: Adult Health

## 2023-08-19 ENCOUNTER — Ambulatory Visit: Admitting: Adult Health

## 2023-08-19 VITALS — BP 140/60 | HR 63 | Temp 98.0°F | Ht 60.0 in | Wt 117.0 lb

## 2023-08-19 DIAGNOSIS — R600 Localized edema: Secondary | ICD-10-CM | POA: Diagnosis present

## 2023-08-19 MED ORDER — DOXYCYCLINE HYCLATE 100 MG PO CAPS: 100.0000 mg | ORAL_CAPSULE | Freq: Two times a day (BID) | ORAL | 0 refills | Status: DC

## 2023-08-19 NOTE — Progress Notes (Addendum)
 Subjective:    Patient ID: [name], female    DOB: 07-23-1945, 78 y.o.   MRN: [id number]  HPI 78 year old female who  has a past medical history of Allergies, Anemia, Bruises easily, Complication of anesthesia, Crohn's disease (HCC), Deviated septum, GERD (gastroesophageal reflux disease), History of hiatal hernia, HTN (hypertension), Kidney disease, chronic, stage III (GFR 30-59 ml/min) (HCC), Neuropathy, Osteoarthritis, Pancreatic insufficiency, PONV (postoperative nausea and vomiting), Scoliosis, Squamous cell carcinoma of arm, Whooping cough (2015), and Whooping cough with pneumonia.  She is a patient of Dr. Theophilus who I am seeing today for an acute issue. She reports that about 4 days ago she developed swelling to her right lower extremity. The swelling happened suddenly. She does not have much pain in her right leg. Denies chest pain but slight worsening SOB. She has no known history of blood clots. She reports last week she was on a 4.5 hour car trip but got out during the trip.     Review of Systems See HPI   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   Whooping cough 2015   Whooping cough with pneumonia    2017    Social History   Socioeconomic History   Marital status: Married    Spouse name: Not on file   Number of children: 3   Years of education: Not on file   Highest education level: Not on file  Occupational  History   Not on file  Tobacco Use   Smoking status: Former    Current packs/day: 0.00    Average packs/day: 1 pack/day for 30.0 years (30.0 ttl pk-yrs)    Types: Cigarettes    Start date: 13    Quit date: 2    Years since quitting: 35.6   Smokeless tobacco: Never  Vaping Use   Vaping status: Never Used  Substance and Sexual Activity   Alcohol use: Not Currently   Drug use: Never   Sexual activity: Not Currently    Birth control/protection: Post-menopausal  Other Topics Concern   Not on file  Social History Narrative   Not on file   Social Drivers of Health   Financial Resource Strain: Low Risk  (04/16/2022)   Overall Financial Resource Strain (CARDIA)    Difficulty of Paying Living Expenses: Not hard at all  Food Insecurity: Patient Declined (02/03/2023)   Hunger Vital Sign    Worried About Running Out of Food in the Last Year: Patient declined    Ran Out of Food in the Last Year: Patient declined  Transportation Needs: Patient Declined (02/03/2023)   PRAPARE - Transportation    Lack of Transportation (Medical): Patient declined    Lack of Transportation (Non-Medical): Patient declined  Physical Activity: Insufficiently Active (04/16/2022)   Exercise Vital  Sign    Days of Exercise per Week: 2 days    Minutes of Exercise per Session: 20 min  Stress: No Stress Concern Present (04/16/2022)   [employer] of Occupational Health - Occupational Stress Questionnaire    Feeling of Stress : Not at all  Social Connections: Patient Declined (02/03/2023)   Social Connection and Isolation Panel    Frequency of Communication with Friends and Family: Patient declined    Frequency of Social Gatherings with Friends and Family: Patient declined    Attends Religious Services: Patient declined    Active Member of Clubs or Organizations: Patient declined    Attends Banker Meetings: Patient declined    Marital Status: Patient declined  Recent Concern: Social Connections -  Moderately Isolated (01/31/2023)   Social Connection and Isolation Panel    Frequency of Communication with Friends and Family: More than three times a week    Frequency of Social Gatherings with Friends and Family: Once a week    Attends Religious Services: Never    Database administrator or Organizations: No    Attends Banker Meetings: Never    Marital Status: Married  Catering manager Violence: Patient Declined (02/03/2023)   Humiliation, Afraid, Rape, and Kick questionnaire    Fear of Current or Ex-Partner: Patient declined    Emotionally Abused: Patient declined    Physically Abused: Patient declined    Sexually Abused: Patient declined    Past Surgical History:  Procedure Laterality Date   BIOPSY  11/05/2022   Procedure: BIOPSY;  Surgeon: Albertus Gordy HERO, MD;  Location: [facility] ENDOSCOPY;  Service: Gastroenterology;;   CATARACT EXTRACTION, BILATERAL  2018   with IOC implant    COLONOSCOPY     DEBRIDEMENT AND CLOSURE WOUND Right 10/01/2018   Procedure: Excision of right knee wound with placement of ACell and Pravena;  Surgeon: Lowery Estefana RAMAN, DO;  Location: [facility];  Service: Plastics;  Laterality: Right;  30 min   ESOPHAGOGASTRODUODENOSCOPY (EGD) WITH PROPOFOL  N/A 11/05/2022   Procedure: ESOPHAGOGASTRODUODENOSCOPY (EGD) WITH PROPOFOL ;  Surgeon: Albertus Gordy HERO, MD;  Location: [facility] ENDOSCOPY;  Service: Gastroenterology;  Laterality: N/A;   HERNIA REPAIR     knee fracture  Right    NASAL FRACTURE SURGERY     needed b/c she was getting frequent sinus infections    TONSILLECTOMY     TOTAL KNEE ARTHROPLASTY Right 07/28/2018   Procedure: TOTAL KNEE ARTHROPLASTY;  Surgeon: Ernie Cough, MD;  Location: [facility];  Service: Orthopedics;  Laterality: Right;   TOTAL KNEE ARTHROPLASTY Left 03/02/2019   Procedure: TOTAL KNEE ARTHROPLASTY;  Surgeon: Ernie Cough, MD;  Location: [facility];  Service: Orthopedics;  Laterality: Left;  70 mins   UPPER GASTROINTESTINAL  ENDOSCOPY     XI ROBOTIC ASSISTED HIATAL HERNIA REPAIR N/A 01/31/2023   Procedure: XI ROBOTIC ASSISTED HIATAL HERNIA REPAIR WITH MESH;  Surgeon: Stechschulte, Deward PARAS, MD;  Location: [facility];  Service: General;  Laterality: N/A;    Family History  Problem Relation Age of Onset   Diabetes Father    Parkinson's disease Father    Colon cancer Other    Asthma Neg Hx    Esophageal cancer Neg Hx    Pancreatic cancer Neg Hx    Liver disease Neg Hx    Stomach cancer Neg Hx     Allergies  Allergen Reactions   Aspirin Other (See Comments)    Intestinal Bleeding   Latex Other (See Comments)  Redness and skin peeling   Other Itching    Nuts-itching in throat  Seeds-stomach issues with chrons  Pt has emphysema    Adhesive [Tape] Other (See Comments)    Redness and skin peeling   Ciprofloxacin Nausea And Vomiting   Codeine Nausea And Vomiting   Demerol [Meperidine Hcl] Nausea And Vomiting   Fentanyl  Nausea And Vomiting    Makes me very sick   Lactose Intolerance (Gi) Other (See Comments)    Pt has Crohn's    Prednisone  Other (See Comments)    Upset stomach   Septra [Sulfamethoxazole-Trimethoprim] Nausea Only    Current Outpatient Medications on File Prior to Visit  Medication Sig Dispense Refill   albuterol  (VENTOLIN  HFA) 108 (90 Base) MCG/ACT inhaler Inhale 2 puffs into the lungs every 6 (six) hours as needed for wheezing or shortness of breath. 8 g 6   azelastine  (ASTELIN ) 0.1 % nasal spray Place 2 sprays into both nostrils 2 (two) times daily. Use in each nostril as directed 30 mL 12   budesonide  (ENTOCORT EC ) 3 MG 24 hr capsule TAKE 3 CAPSULES (9 MG TOTAL) BY MOUTH DAILY. 270 capsule 2   Calcium  Citrate-Vitamin D (CALCIUM  + D PO) Take 1 tablet by mouth 2 (two) times daily.     cetirizine  (ZYRTEC ) 10 MG tablet Take 1 tablet (10 mg total) by mouth daily. 90 tablet 0   dicyclomine  (BENTYL ) 20 MG tablet Take 1 tablet (20 mg total) by mouth 4 (four) times daily as needed for spasms.  360 tablet 3   diphenoxylate -atropine  (LOMOTIL ) 2.5-0.025 MG tablet Take 2 tablets by mouth 3 (three) times daily. TAKE 2 TABLETS BY MOUTH IN THE MORNING ,AT NOON, AND AT BEDTIME AS DIRECTED 180 tablet 11   DULoxetine  (CYMBALTA ) 60 MG capsule Take 1 capsule (60 mg total) by mouth 2 (two) times daily. 180 capsule 0   fluticasone  (FLONASE ) 50 MCG/ACT nasal spray SPRAY 2 SPRAYS INTO EACH NOSTRIL EVERY DAY 48 mL 0   furosemide  (LASIX ) 20 MG tablet TAKE 1 TABLET (20 MG TOTAL) BY MOUTH DAILY AS NEEDED FOR EDEMA. 90 tablet 0   losartan  (COZAAR ) 100 MG tablet Take 1 tablet (100 mg total) by mouth daily. 90 tablet 0   Multiple Vitamin (MULTIVITAMIN WITH MINERALS) TABS tablet Take 1 tablet by mouth daily.     mupirocin  ointment (BACTROBAN ) 2 % Apply 1 Application topically.     omeprazole  (PRILOSEC) 40 MG capsule Take 1 capsule (40 mg total) by mouth daily. 90 capsule 3   ondansetron  (ZOFRAN ) 4 MG tablet Take 1 tablet (4 mg total) by mouth every 8 (eight) hours as needed for nausea or vomiting. 90 tablet 0   ondansetron  (ZOFRAN -ODT) 4 MG disintegrating tablet 4mg  ODT q4 hours prn nausea/vomit 20 tablet 0   predniSONE  (DELTASONE ) 20 MG tablet 2 tabs po daily x 4 days 8 tablet 0   triamcinolone  cream (KENALOG ) 0.1 % Apply topically.     valACYclovir  (VALTREX ) 1000 MG tablet TAKE 1 TABLET BY MOUTH TWICE A DAY AS NEEDED 180 tablet 0   No current facility-administered medications on file prior to visit.    BP (!) 140/60   Pulse 63   Temp 98 F (36.7 C) (Oral)   Ht 5' (1.524 m)   Wt 117 lb (53.1 kg)   SpO2 99%   BMI 22.85 kg/m       Objective:   Physical Exam Vitals and nursing note reviewed.  Constitutional:      Appearance: Normal  appearance.  Cardiovascular:     Rate and Rhythm: Normal rate and regular rhythm.     Pulses: Normal pulses.     Heart sounds: Normal heart sounds.  Pulmonary:     Effort: Pulmonary effort is normal.     Breath sounds: Normal breath sounds.  Musculoskeletal:         General: Normal range of motion.     Right lower leg: Edema (nonpitting) present.     Comments: No calf tenderness   Skin:    General: Skin is warm and dry.     Findings: Erythema (localized erthema noted around the shin and lateral aspect of the leg) present. No rash.  Neurological:     General: No focal deficit present.     Mental Status: She is alert and oriented to person, place, and time.  Psychiatric:        Mood and Affect: Mood normal.        Behavior: Behavior normal.        Thought Content: Thought content normal.        Judgment: Judgment normal.       Assessment & Plan:  1. Edema of right lower extremity (Primary) - Concern for DVT vs cellulitis. Will start on Eliquis 5 mg BID as a prevtative measure. Will get stat DVT  - US  Venous Img Lower Unilateral Right (DVT); Future - Consider CT of chest  Samples of Eliquis 5 mg were given to the patient, quantity 1 box, Lot Number [id number] - Will send in abx if US  is negative   Darleene Shape, NP  I personally spent a total of 31 minutes in the care of the patient today including preparing to see the patient, getting/reviewing separately obtained history, performing a medically appropriate exam/evaluation, placing orders, documenting clinical information in the EHR, communicating results, and coordinating care.

## 2023-08-27 ENCOUNTER — Ambulatory Visit (INDEPENDENT_AMBULATORY_CARE_PROVIDER_SITE_OTHER): Admitting: Pulmonary Disease

## 2023-08-27 ENCOUNTER — Encounter: Payer: Self-pay | Admitting: Pulmonary Disease

## 2023-08-27 VITALS — BP 120/64 | HR 61 | Ht 60.0 in | Wt 116.0 lb

## 2023-08-27 DIAGNOSIS — J432 Centrilobular emphysema: Secondary | ICD-10-CM

## 2023-08-27 DIAGNOSIS — J479 Bronchiectasis, uncomplicated: Secondary | ICD-10-CM | POA: Diagnosis not present

## 2023-08-27 NOTE — Patient Instructions (Addendum)
 Use flutter valve 2-3 times daily for airway clearance  Use albuterol  inhaler 1-2 puffs every 4-6 hours as needed  Follow up in 6 months, call sooner if needed

## 2023-08-27 NOTE — Progress Notes (Signed)
 [facility] Return Visit   Referring Provider Theophilus Andrews, Tully GRADE, MD [address] [place],  [place] [address]  Primary Care Provider Theophilus Andrews, Tully GRADE, MD  Patient Profile: [name] is a 78 y.o. female with a past medical history noteworthy for HTN, CKD 3, neuropathy, SCC of the RLE, RA, scoliosis who returns to the [facility] for follow-up of the problem(s) noted below.  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 was last seen in the GI office 06/17/2023    Current GI Meds  Budesonide  taper Citrucel Colestid  Bentyl  Lomotil   Interval History   Discussed the use of AI scribe software for clinical note transcription with the patient, who gave verbal consent to proceed.  Ms. Height returns to the office today accompanied by her husband who provides history in conjunction with her.  Constellation of symptoms she has described includes: -- Chronic diarrhea -- Abdominal pain -- Nausea -- Gas -- Dysphagia  Her record documents multiple GI conditions that could be contributing to her symptoms: -- Crohn's colitis -- IBS -- Previous considerations of EPI, SIBO  Previous modalities employed have included: -- Treatment of Crohn's disease with mesalamine  and budesonide  -- Antibiotics-metronidazole  and Augmentin  presumably for SIBO -- Colestid  --pills were too big to swallow -- Pancreatic enzymes -no benefit -- Uses Lomotil  and Imodium   She was seen in the emergency department 04/16/2023 with complaints of abdominal pain.   CT imaging at that time showed possible inflammation in the terminal ileum with a dilated TI and fecalized material  suggestive of a possible evolving bowel obstruction.   She was given a 4-day course of prednisone  and subsequently followed up with our office. Previously on maintenance mesalamine  4.8 g daily -stopped due to ineffectiveness At the time of her last visit fecal calprotectin was noted to be elevated at 213 Advised to complete a 12-month taper of budesonide  9 mg, 6 mg, 3 mg  Interval MRE 05/12/2023 - ?  Partial right hemicolectomy and ileocolic anastomosis, normal terminal ileum, featureless, thickened and tethered transverse colon without acute inflammatory changes, normal descending colon  At time of last clinic visit 06/2023 I recommended additional diagnostic studies: -- Celiac panel negative -- Alpha gal panel negative -- Fecal fat and pancreatic elastase normal -- Fecal calprotectin 266 -- SIBO breath test + 08/2023 -- 7 alpha C4 laboratory test pending for evaluation of bile acid malabsorption  EGD and colonoscopy completed 07/2023 EGD - Tortuous esophagus. Dilation performed in the entire esophagus. No evidence of residual paraesophageal hernia.  - LA Grade A esophagitis with no bleeding.  - Normal gastric body, antrum, cardia and gastric fundus. Biopsied.  - A small amount of retained fibrous/vegetable material was present in the stomach. No documented history of gastroparesis.  - Normal duodenal bulb and second portion of the duodenum. Biopsied.  - Tortuosity of esophagus and/or esophagitis could be contributing to patient's reported dysphagia. An esophageal motility disorder remains in the differential diagnosis.  Colonoscopy - Decreased sphincter tone found on digital rectal exam.  - The colonic mucosa appeared diffusely atrophic and scarred but without significantly active inflammation endoscopically. Biopsied. Rule out active IBD, microscopic colitis and dysplasia.  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 Biopsied -rule out dysplasia. - The examined portion of  the ileum was normal.  Biopsied. - One 5 mm polyp in the rectum, removed with a cold biopsy forceps. Resected and retrieved.  - The distal rectum and anal verge are normal on retroflexion view  Pathology negative for H. pylori and celiac disease Right colon biopsies showed chronic active colitis; chronic inactive colitis on left colon biopsies Polyps were hyperplastic polyps History of Present Illness With regard to current symptoms, she reports  - Diarrhea occurs up to nine to ten times daily - Increased frequency began after a recent trip - Water  intake exacerbates diarrhea - Has symptoms of fecal urgency and gas - No associated fever  - Ongoing abdominal pain predominantly for left abdomen  - Reports 1 episode of vomiting after eating a dry pork chop but no other recent episodes  - Currently taking Lomotil  and dicyclomine , both less effective than previously - Concerned about discontinuing these medications due to severity of symptoms  - No extraintestinal manifestations of IBD  GI Review of Symptoms Significant for abdominal pain, diarrhea, nausea, gas. Otherwise negative.  General Review of Systems  Review of systems is significant for the pertinent positives and negatives as listed per the HPI.  Full ROS is otherwise negative.  Inflammatory Bowel Disease History  Crohn's colitis diagnosed at [facility] in 1997 Treated with moderate to high dose mesalamine  Lialda  2.4-4.8 g daily Reported using amoxicillin  periodically for Crohn's flares with benefit in the past but subsequent loss of efficacy  IBD Medication History Mesalamine  agents   Past Medical History   Past Medical History:  Diagnosis Date   Allergies    Anemia    Bruises easily    Complication of anesthesia    Crohn's disease (HCC)    GI Dr Miquel in [place], [place]     Deviated septum    GERD (gastroesophageal reflux disease)    History of hiatal hernia    HTN (hypertension)    pcp Dr Evalene Polo  in  [place], [place]     Kidney disease, chronic, stage III (GFR 30-59 ml/min) (HCC)    Neuropathy    fingers,periodically   Osteoarthritis    Pancreatic insufficiency    PONV (postoperative nausea and vomiting)    Scoliosis    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   Whooping cough 2015   Whooping cough with pneumonia    2017     Past Surgical History   Past Surgical History:  Procedure Laterality Date   BIOPSY  11/05/2022   Procedure: BIOPSY;  Surgeon: Albertus Gordy HERO, MD;  Location: [facility] ENDOSCOPY;  Service: Gastroenterology;;   CATARACT EXTRACTION, BILATERAL  2018   with IOC implant    COLONOSCOPY     DEBRIDEMENT AND CLOSURE WOUND Right 10/01/2018   Procedure: Excision of right knee wound with placement of ACell and Pravena;  Surgeon: Lowery Estefana RAMAN, DO;  Location: [facility];  Service: Plastics;  Laterality: Right;  30 min   ESOPHAGOGASTRODUODENOSCOPY (EGD) WITH PROPOFOL  N/A 11/05/2022   Procedure: ESOPHAGOGASTRODUODENOSCOPY (EGD) WITH PROPOFOL ;  Surgeon: Albertus Gordy HERO, MD;  Location: [facility] ENDOSCOPY;  Service: Gastroenterology;  Laterality: N/A;   HERNIA REPAIR     knee fracture  Right    NASAL FRACTURE SURGERY     needed b/c she was getting frequent sinus infections    TONSILLECTOMY     TOTAL KNEE ARTHROPLASTY Right 07/28/2018   Procedure: TOTAL KNEE ARTHROPLASTY;  Surgeon: Ernie Cough, MD;  Location: [facility] ORS;  Service:  Orthopedics;  Laterality: Right;   TOTAL KNEE ARTHROPLASTY Left 03/02/2019   Procedure: TOTAL KNEE ARTHROPLASTY;  Surgeon: Ernie Cough, MD;  Location: [facility];  Service: Orthopedics;  Laterality: Left;  70 mins   UPPER GASTROINTESTINAL ENDOSCOPY     XI ROBOTIC ASSISTED HIATAL HERNIA REPAIR N/A 01/31/2023   Procedure: XI ROBOTIC ASSISTED HIATAL HERNIA REPAIR WITH MESH;  Surgeon: Lyndel Deward PARAS, MD;  Location: [facility];  Service: General;  Laterality: N/A;     Allergies and Medications   Allergies  Allergen Reactions    Aspirin Other (See Comments)    Intestinal Bleeding   Latex Other (See Comments)    Redness and skin peeling   Other Itching    Nuts-itching in throat  Seeds-stomach issues with chrons  Pt has emphysema    Adhesive [Tape] Other (See Comments)    Redness and skin peeling   Ciprofloxacin Nausea And Vomiting   Codeine Nausea And Vomiting   Demerol [Meperidine Hcl] Nausea And Vomiting   Fentanyl  Nausea And Vomiting    Makes me very sick   Lactose Intolerance (Gi) Other (See Comments)    Pt has Crohn's    Prednisone  Other (See Comments)    Upset stomach   Septra [Sulfamethoxazole-Trimethoprim] Nausea Only   Current Meds  Medication Sig   albuterol  (VENTOLIN  HFA) 108 (90 Base) MCG/ACT inhaler Inhale 2 puffs into the lungs every 6 (six) hours as needed for wheezing or shortness of breath.   azelastine  (ASTELIN ) 0.1 % nasal spray Place 2 sprays into both nostrils 2 (two) times daily. Use in each nostril as directed   budesonide  (ENTOCORT EC ) 3 MG 24 hr capsule TAKE 3 CAPSULES (9 MG TOTAL) BY MOUTH DAILY.   Calcium  Citrate-Vitamin D (CALCIUM  + D PO) Take 1 tablet by mouth 2 (two) times daily.   cetirizine  (ZYRTEC ) 10 MG tablet Take 1 tablet (10 mg total) by mouth daily.   dicyclomine  (BENTYL ) 20 MG tablet Take 1 tablet (20 mg total) by mouth 4 (four) times daily as needed for spasms.   diphenoxylate -atropine  (LOMOTIL ) 2.5-0.025 MG tablet Take 2 tablets by mouth 3 (three) times daily. TAKE 2 TABLETS BY MOUTH IN THE MORNING ,AT NOON, AND AT BEDTIME AS DIRECTED   doxycycline  (VIBRAMYCIN ) 100 MG capsule Take 1 capsule (100 mg total) by mouth 2 (two) times daily.   DULoxetine  (CYMBALTA ) 60 MG capsule Take 1 capsule (60 mg total) by mouth 2 (two) times daily.   fluticasone  (FLONASE ) 50 MCG/ACT nasal spray SPRAY 2 SPRAYS INTO EACH NOSTRIL EVERY DAY   losartan  (COZAAR ) 100 MG tablet Take 1 tablet (100 mg total) by mouth daily.   Multiple Vitamin (MULTIVITAMIN WITH MINERALS) TABS tablet Take 1  tablet by mouth daily.   mupirocin  ointment (BACTROBAN ) 2 % Apply 1 Application topically.   omeprazole  (PRILOSEC) 40 MG capsule Take 1 capsule (40 mg total) by mouth daily.   ondansetron  (ZOFRAN ) 4 MG tablet Take 1 tablet (4 mg total) by mouth every 8 (eight) hours as needed for nausea or vomiting.   ondansetron  (ZOFRAN -ODT) 4 MG disintegrating tablet 4mg  ODT q4 hours prn nausea/vomit   rifaximin  (XIFAXAN ) 550 MG TABS tablet Take 1 tablet (550 mg total) by mouth 3 (three) times daily for 14 days.   triamcinolone  cream (KENALOG ) 0.1 % Apply topically.   valACYclovir  (VALTREX ) 1000 MG tablet TAKE 1 TABLET BY MOUTH TWICE A DAY AS NEEDED     Family His   Family History  Problem Relation Age of Onset  Diabetes Father    Parkinson's disease Father    Colon cancer Other    Asthma Neg Hx    Esophageal cancer Neg Hx    Pancreatic cancer Neg Hx    Liver disease Neg Hx    Stomach cancer Neg Hx     Social History   Social History   Tobacco Use   Smoking status: Former    Current packs/day: 0.00    Average packs/day: 1 pack/day for 30.0 years (30.0 ttl pk-yrs)    Types: Cigarettes    Start date: 19    Quit date: 1990    Years since quitting: 35.6   Smokeless tobacco: Never  Vaping Use   Vaping status: Never Used  Substance Use Topics   Alcohol use: Not Currently   Drug use: Never   Abria reports that she quit smoking about 35 years ago. Her smoking use included cigarettes. She started smoking about 65 years ago. She has a 30 pack-year smoking history. She has never used smokeless tobacco. She reports that she does not currently use alcohol. She reports that she does not use drugs.  Vital Signs and Physical Examination   Vitals:   08/28/23 1055  BP: 122/68  Pulse: 100    Body mass index is 22.85 kg/m. Weight: 117 lb (53.1 kg)  General: Petite elderly female in no acute distress Head: Normocephalic and atraumatic Eyes: Sclerae anicteric, EOMI Lungs: Clear throughout  to auscultation Heart: Regular rate and rhythm; No murmurs, rubs or bruits Abdomen: Soft, tender in mid abdomen and non distended. No masses, hepatosplenomegaly or hernias noted. Normal Bowel sounds Rectal: Deferred Musculoskeletal: Symmetrical with no gross deformities     Review of Data   The following data was reviewed at the time of this encounter:   Laboratory Studies      Latest Ref Rng & Units 08/18/2023    1:59 PM 04/28/2023    2:17 PM 04/16/2023    7:20 PM  CBC  WBC 4.0 - 10.5 K/uL 9.8  10.6  10.0   Hemoglobin 12.0 - 15.0 g/dL 89.7  88.8  87.4   Hematocrit 36.0 - 46.0 % 32.1  32.8  36.2   Platelets 150 - 400 K/uL 372  413.0  369     Lab Results  Component Value Date   LIPASE 17 04/16/2023      Latest Ref Rng & Units 08/18/2023    1:59 PM 04/28/2023    2:17 PM 04/16/2023    8:30 PM  CMP  Glucose 70 - 99 mg/dL 890  95  881   BUN 8 - 23 mg/dL 41  50  68   Creatinine 0.44 - 1.00 mg/dL 8.58  7.91  6.89   Sodium 135 - 145 mmol/L 136  133  130   Potassium 3.5 - 5.1 mmol/L 5.0  4.1  4.5   Chloride 98 - 111 mmol/L 102  103  103   CO2 22 - 32 mmol/L 23  21  15    Calcium  8.9 - 10.3 mg/dL 9.7  9.0  8.9   Total Protein 6.5 - 8.1 g/dL 7.6  7.1  7.2   Total Bilirubin 0.0 - 1.2 mg/dL 0.2  0.4  0.5   Alkaline Phos 38 - 126 U/L 83  63  83   AST 15 - 41 U/L 20  13  18    ALT 0 - 44 U/L 11  11  12     Lab Results  Component Value Date  CRP 1.2 04/28/2023   Lab Results  Component Value Date   TSH 0.99 04/28/2023   Labs 06/2023 -- Celiac panel negative -- Alpha gal panel negative -- Fecal fat and pancreatic elastase normal -- Fecal calprotectin 266 -- SIBO breath test + 08/2023 -- 7 alpha C4 laboratory test pending for evaluation of bile acid malabsorption   IBD Labs  Prebiologic Labs 04/2023 Hepatitis B surface antigen negative Hepatitis B surface antibody negative Hepatitis B core antibody negative QuantiFERON gold negative  Therapeutic Drug  Monitoring  Thiopurine metabolite levels:  Date:                6-TGN       6-MMP  Biologic level and antibodies:  Fecal Calprotectin    Imaging Studies  MRE 05/12/2023 1. Appearance of the colon suggests partial right hemicolectomy and ileocolic anastomosis. 2. Interval resolution of distended, fecalized loops of neoterminal ileum seen on prior examination. No specifically visible evidence of stricture or obstruction on today's examination. 3. The remnant distal ascending and transverse colon remains diffusely featureless, thickened, and tethered, however without substantial mucosal hyperenhancement or acute inflammatory findings on today's examination. 4. The descending colon is relatively normal in appearance, and the sigmoid colon and rectum are decompressed although somewhat featureless and hyperenhancing. 5. Findings are consistent with chronic and postoperative stigmata of Crohn's disease with some evidence of active inflammation involving the distal colon. No evidence of complicating stricture, obstruction, fistula, or abscess on today's examination.  CTAP 04/17/2023 1. Terminal ileitis with dilated terminal ileum filled with fecalized material suggestive of developing obstruction. 2. Associated irregular bowel wall thickening of the proximal transverse colon in the setting of a likely partial right colectomy. Recommend colonoscopy status post treatment and status post complete resolution of inflammatory changes to exclude an underlying lesion. 3. Gastric lumen filled with multiple oval hyperdensities. Correlate with ingestion of medicine or candies. 4. Aortic Atherosclerosis (ICD10-I70.0) -including four-vessel coronary calcification.  UGI 03/06/2023 Extensive tertiary contractions are noted in middle and distal portions of the esophagus suggesting presbyesophagus.   Moderate to severe persistent narrowing of gastroesophageal junction is noted; potentially this may  be due to postoperative edema given recent surgery. 13 mm barium tablet was delayed at this level and not visualized to pass into the stomach.   There was very slow transit of contrast from stomach into duodenum. This may simply be due to motility disorder. Visualized portion of duodenum is unremarkable.   Mild gastric wall thickening is noted in body of stomach which may be due to recent surgery or may represent some degree of gastritis.  CTAP 11/04/2022 1. Wall thickening and mild mucosal hyperenhancement about the right and transverse colon compatible with colitis. This has slightly progressed compared to CT 09/24/2022. 2. Large hiatal hernia with intrathoracic stomach. 3. Fecalized ileum compatible with slow transit. No dilation to suggest obstruction.  MRI/MRCP 10/18/2022 1. Limited exam. 2. Small/atrophic pancreas with slightly prominent main pancreatic duct measuring up to 3-4 mm. 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 Recommend follow up pre and post contrast MRI/MRCP in 2 years. 3. Partially exophytic 2.4 cm proteinaceous/hemorrhagic cyst arising from the left kidney lower pole. 4. Multiple other nonacute observations, as described above.  CTAP 09/24/2022 1. Short segment mural thickening of the hepatic flexure, which may  be due to underdistention or colitis. 2. Trace free fluid adjacent to the pancreatic tail in the left upper quadrant, which is nonspecific but may be seen in the setting of acute pancreatitis. 3. Large hiatal hernia with nearly the entire stomach within the left chest. 4. Aortic Atherosclerosis (ICD10-I70.0) and Emphysema (ICD10-J43.9). Coronary artery calcifications. Assessment for potential risk factor modification, dietary  therapy or pharmacologic therapy may be warranted, if clinically indicated.   GI Procedures and Studies  EGD/Colonoscopy 07/29/2023 EGD - Tortuous esophagus. Dilation performed in the entire esophagus. No evidence of residual paraesophageal hernia.  - LA Grade A esophagitis with no bleeding.  - Normal gastric body, antrum, cardia and gastric fundus. Biopsied.  - A small amount of retained fibrous/vegetable material was present in the stomach. No documented history of gastroparesis.  - Normal duodenal bulb and second portion of the duodenum. Biopsied.  - Tortuosity of esophagus and/or esophagitis could be contributing to patient's reported dysphagia. An esophageal motility disorder remains in the differential diagnosis.  Colonoscopy - Decreased sphincter tone found on digital rectal exam.  - The colonic mucosa appeared diffusely atrophic and scarred but without significantly active inflammation endoscopically. Biopsied. Rule out active IBD, microscopic colitis and dysplasia.  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 Biopsied -rule out dysplasia. - The examined portion of the ileum was normal. Biopsied. - One 5 mm polyp in the rectum, removed with a cold biopsy forceps. Resected and retrieved.  - The distal rectum and anal verge are normal on retroflexion view  Path:   1. Surgical [P], duodenal :      - SMALL BOWEL MUCOSA WITH NO SPECIFIC PATHOLOGIC CHANGE.      - NEGATIVE FOR INTRAEPITHELIAL LYMPHOCYTOSIS OR VILLOUS BLUNTING.       2. Surgical [P], gastric :      - BENIGN GASTRIC MUCOSA WITH CHANGES SUGGESTIVE OF PPI EFFECT.      - NO EVIDENCE OF H. PYLORI ON H&E STAIN.       3. Surgical [P], small bowel, terminal ileum :      - SMALL BOWEL MUCOSA WITH NO SPECIFIC PATHOLOGIC CHANGE.       4. Surgical [P], colon, ascending :      - CHRONIC COLITIS WITH FOCAL ACTIVITY.      - NEGATIVE FOR DYSPLASIA.       5. Surgical [P], ascending colon stenosis :      - CHRONIC ACTIVE COLITIS WITH  MILD ACTIVITY.      - NEGATIVE FOR DYSPLASIA.       6. Surgical [P], colon, transverse :      - CHRONIC ACTIVE COLITIS WITH MILD ACTIVITY.      - NEGATIVE FOR DYSPLASIA.       7. Surgical [P], colon, descending :      - CHRONIC INACTIVE COLITIS.      - NEGATIVE FOR DYSPLASIA.       8. Surgical [P], colon, rectosigmoid :      - CHRONIC INACTIVE COLITIS.      - HYPERPLASTIC POLYP.      - NEGATIVE FOR DYSPLASIA.       9. Surgical [P], colon, rectum, polyp (1) :      - HYPERPLASTIC POLYP.  EGD 11/05/2022 - Tortuous esophagus - Large HH - > 50% of stomach is intrathoracic - Congested and erythematous mucosa in the gastric fundus and body, most likely secondary to [facility] - Normal duodenum  Path: A. STOMACH, BIOPSY:  Gastric oxyntic mucosa with focal  hyperemia.  Negative for Helicobacter pylori.   Colonoscopy 11/23/2020  - Normal ileum - Changes of chronic colitis with trivial patchy inflammation status post biopsies  Path: 1. Surgical [P], right colon biopsy - MILDLY ACTIVE CHRONIC COLITIS, CONSISTENT WITH PATIENT'S CLINICAL HISTORY OF CROHN'S DISEASE - NEGATIVE FOR GRANULOMAS OR DYSPLASIA 2. Surgical [P], transverse colon biopsy - MILDLY ACTIVE CHRONIC COLITIS, CONSISTENT WITH PATIENT'S CLINICAL HISTORY OF CROHN'S DISEASE - NEGATIVE FOR GRANULOMAS OR DYSPLASIA 3. Surgical [P], left colon biopsy - INACTIVE CHRONIC COLITIS, CONSISTENT WITH PATIENT'S CLINICAL HISTORY OF CROHN'S DISEASE - NEGATIVE FOR GRANULOMAS OR DYSPLASIA  Colonoscopy 06/05/2017 The ileum was normal. Chronic colitis noted in the left colon. Diminutive pseudopolyp in the cecum. Mild internal hemorrhoids. Biopsies of the cecal polyp were a benign lymphoid aggregate. Biopsies in the right colon were normal. Biopsies in the left colon revealed inactive chronic colitis. The patient was told to take Lialda  4.8 g daily for 8 weeks then 2.4 g daily thereafter.   Clinical Impression  It is my clinical impression that  Ms. Zuk is a 78 y.o. 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 Jedlicka returns to the office today for follow-up of symptoms of chronic diarrhea, abdominal pain, gas, nausea, fecal urgency which she feels are overall negatively impacting her quality of life.  At the time of her last visit we discussed that she has multiple concomitant medical conditions that could be contributing to symptoms including Crohn's colitis, IBS.  I recommended further evaluation of other etiologies that could be playing a role in her symptoms with laboratory studies, stool tests, EGD and colonoscopy.  These tests ruled out celiac disease, H. pylori infection, pancreatic exocrine insufficiency, alpha gal syndrome.  Laboratory testing with 7 alpha C4 remains pending for bile acid malabsorption.  Reviewed possibility of trialing Viberzi if symptoms persist in the future.  Ms.Wester's colonoscopy did show evidence of a stenosis in the proximal ascending colon likely a sequelae of her Crohn's disease.  Biopsies were negative for dysplasia but did show active inflammation.  In light of her stenosing Crohn's phenotype, we discussed initiation of biologic therapy.  Given her age and risks of immune suppression, I have recommended anti-IL 12/23 therapy.  Discussed data for Abbe Hemp and Tremfya.  At this time I have recommended initiating treatment with Skyrizi both for its effectiveness as well as limited risk of infection/immune suppression in her age group.  Ms. Pall was amenable to proceeding with treatment.  Since her last visit she also underwent breath testing and was found to have a positive breath test for SIBO.  I have recommended proceeding with antibiotic treatment in the form of  rifaximin .  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  She does continue to endorse some pain in her left shoulder that she thinks may be related.  She has had intermittent symptoms of dysphagia.  Recent EGD did not show recurrence of her paraesophageal hernia.  Empiric savory dilation was performed to 16 mm with acceptable results.  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  Last imaging performed 10/2022 recommended follow-up imaging in 2 years-2026.  Plan  Initiate Crohn's treatment with Skyrizi induction at 600 mg IV every 4 weeks x 3, then 360 mg SQ Q 8 weeks Prescribe  rifaximin  550 mg p.o. 3 times daily x 14 days for SIBO documented by breath test; extend course if there is partial response Continue budesonide  taper  -anticipate ceasing budesonide  after initiation of Skyrizi Continue Lomotil  and Imodium  Follow-up results of 7 alpha C4 testing Consider trial of Viberzi if symptoms persist in the future. Continue omeprazole  40 mg orally daily Continue low fiber diet given stenosing Crohn's disease Recommended iron supplementation after last visit Monitor weight and anthropometrics Repeat MRI/MRCP for follow-up of IPMN 10/2024  Planned Follow Up 4 months  The patient or caregiver verbalized understanding of the material covered, with no barriers to understanding. All questions were answered. Patient or caregiver is agreeable with the plan outlined above.    It was a pleasure to see Laiken.  If you have any questions or concerns regarding this evaluation, do not hesitate to contact me.  Inocente Hausen, MD [facility]   I spent total of 40 minutes in both face-to-face (25 minutes interview) and non-face-to-face (15 minutes chart review, care coordination, documentation)  activities, excluding procedures performed, for the visit on the date of this encounter.
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
## 2023-08-28 ENCOUNTER — Ambulatory Visit (INDEPENDENT_AMBULATORY_CARE_PROVIDER_SITE_OTHER): Admitting: Pediatrics

## 2023-08-28 ENCOUNTER — Encounter: Payer: Self-pay | Admitting: Pediatrics

## 2023-08-28 VITALS — BP 122/68 | HR 100 | Ht 60.0 in | Wt 117.0 lb

## 2023-08-28 DIAGNOSIS — Z79899 Other long term (current) drug therapy: Secondary | ICD-10-CM

## 2023-08-28 DIAGNOSIS — Z8719 Personal history of other diseases of the digestive system: Secondary | ICD-10-CM

## 2023-08-28 DIAGNOSIS — K529 Noninfective gastroenteritis and colitis, unspecified: Secondary | ICD-10-CM

## 2023-08-28 DIAGNOSIS — K638219 Small intestinal bacterial overgrowth, unspecified: Secondary | ICD-10-CM

## 2023-08-28 DIAGNOSIS — K589 Irritable bowel syndrome without diarrhea: Secondary | ICD-10-CM

## 2023-08-28 DIAGNOSIS — K219 Gastro-esophageal reflux disease without esophagitis: Secondary | ICD-10-CM

## 2023-08-28 DIAGNOSIS — K862 Cyst of pancreas: Secondary | ICD-10-CM

## 2023-08-28 DIAGNOSIS — D509 Iron deficiency anemia, unspecified: Secondary | ICD-10-CM

## 2023-08-28 DIAGNOSIS — K5010 Crohn's disease of large intestine without complications: Secondary | ICD-10-CM

## 2023-08-28 MED ORDER — RIFAXIMIN 550 MG PO TABS: 550.0000 mg | ORAL_TABLET | Freq: Three times a day (TID) | ORAL | 0 refills | Status: AC

## 2023-08-28 NOTE — Patient Instructions (Addendum)
 We have sent the following medications to your pharmacy for you to pick up at your convenience: Xifaxan  550 mg three times daily for 14 days.   _______________________________________________________  If your blood pressure at your visit was 140/90 or greater, please contact your primary care physician to follow up on this.  _______________________________________________________  If you are age 78 or older, your body mass index should be between 23-30. Your Body mass index is 22.85 kg/m. If this is out of the aforementioned range listed, please consider follow up with your Primary Care Provider.  If you are age 48 or younger, your body mass index should be between 19-25. Your Body mass index is 22.85 kg/m. If this is out of the aformentioned range listed, please consider follow up with your Primary Care Provider.   ________________________________________________________  The [facility] providers would like to encourage you to use MYCHART to communicate with providers for non-urgent requests or questions.  Due to long hold times on the telephone, sending your provider a message by [facility] may be a faster and more efficient way to get a response.  Please allow 48 business hours for a response.  Please remember that this is for non-urgent requests.  _______________________________________________________  [facility] is using a team-based approach to care.  Your team is made up of your doctor and two to three APPS. Our APPS (Nurse Practitioners and Physician Assistants) work with your physician to ensure care continuity for you. They are fully qualified to address your health concerns and develop a treatment plan. They communicate directly with your gastroenterologist to care for you. Seeing the Advanced Practice Practitioners on your physician's team can help you by facilitating care more promptly, often allowing for earlier appointments, access to diagnostic testing, procedures, and  other specialty referrals.

## 2023-08-30 ENCOUNTER — Encounter: Payer: Self-pay | Admitting: Pediatrics

## 2023-09-02 ENCOUNTER — Telehealth: Payer: Self-pay

## 2023-09-02 NOTE — Telephone Encounter (Signed)
 Pharmacy Patient Advocate Encounter   Received notification from CoverMyMeds that prior authorization for Xifaxan  550MG  tablets is required/requested.   Insurance verification completed.   The patient is insured through [facility] .   Per test claim: PA required; PA submitted to above mentioned insurance via Latent Key/confirmation #/EOC [id number] Status is pending

## 2023-09-03 NOTE — Telephone Encounter (Signed)
 Spoke to pharmacist at [facility] and she advised that they ordered the medication already and it is due to come in today.  She said that they received the approval via Medicare.

## 2023-09-03 NOTE — Telephone Encounter (Signed)
 Pharmacy Patient Advocate Encounter  Received notification from [facility] Medicare that Prior Authorization for Xifaxan  550MG  tablets has been APPROVED from 09-02-2023 to 09-16-2023   PA #/Case ID/Reference #: [id number]

## 2023-09-05 ENCOUNTER — Encounter: Payer: Self-pay | Admitting: Pulmonary Disease

## 2023-09-10 LAB — 7ALPHAC4: 7AlphaC4: 24 ng/mL

## 2023-09-11 ENCOUNTER — Ambulatory Visit: Payer: Self-pay | Admitting: Pediatrics

## 2023-09-17 ENCOUNTER — Encounter: Payer: Self-pay | Admitting: Physician Assistant

## 2023-09-19 ENCOUNTER — Other Ambulatory Visit: Payer: Self-pay

## 2023-09-19 DIAGNOSIS — R300 Dysuria: Secondary | ICD-10-CM

## 2023-09-22 ENCOUNTER — Other Ambulatory Visit (HOSPITAL_COMMUNITY): Payer: Self-pay

## 2023-09-22 ENCOUNTER — Telehealth: Payer: Self-pay | Admitting: Pharmacy Technician

## 2023-09-22 ENCOUNTER — Telehealth: Payer: Self-pay

## 2023-09-22 NOTE — Telephone Encounter (Signed)
 Dr. Suzann, The site of care was entered for [facility].  Will will update the site of care for [facility]) and get the patient scheduled as soon as possible.  @[email or web address] , Please change site of care to [address].  Thanks

## 2023-09-22 NOTE — Telephone Encounter (Signed)
 Rosina,  Please submit auth for Skyrizi  on-body.   Once approved, please let me know so we can schedule patient for IV dosing as soon as possible.   Dr. Suzann, What dose will the maintenance therapy be??   Auth Submission: no auth needed Site of care: Site of care: [facility] Payer: Medicare A/B & BCBS Medication & CPT/J Code(s) submitted: Skyrizi  (Risankizumab -rzaa) G7672 Diagnosis Code:  Route of submission (phone, fax, portal):  Phone # Fax # Auth type: Buy/Bill PB Units/visits requested: X3 DOSES Reference number:  Approval from: 09/22/23 to 02/07/23

## 2023-09-22 NOTE — Telephone Encounter (Signed)
 Pharmacy Patient Advocate Encounter   Received notification from Pt Calls Messages that prior authorization for Skyrizi  360MG /2.4ML (150MG /ML) single-dose prefilled cartridge with on-body injector is required/requested.   Insurance verification completed.   The patient is insured through [facility].   Per test claim: PA required; PA submitted to above mentioned insurance via Latent Key/confirmation #/EOC [address] Status is pending

## 2023-09-22 NOTE — Telephone Encounter (Signed)
 PA request has been Submitted. New Encounter has been or will be created for follow up. For additional info see Pharmacy Prior Auth telephone encounter from 09-22-2023.

## 2023-09-23 ENCOUNTER — Other Ambulatory Visit (HOSPITAL_COMMUNITY): Payer: Self-pay

## 2023-09-23 ENCOUNTER — Other Ambulatory Visit (HOSPITAL_COMMUNITY): Payer: Self-pay | Admitting: Pediatrics

## 2023-09-23 MED ORDER — SKYRIZI 360 MG/2.4ML ~~LOC~~ SOCT: 360.0000 mg | SUBCUTANEOUS | 5 refills | Status: DC

## 2023-09-23 MED ORDER — SKYRIZI 360 MG/2.4ML ~~LOC~~ SOCT: 1.0000 | SUBCUTANEOUS | 5 refills | Status: DC

## 2023-09-23 NOTE — Addendum Note (Signed)
 Addended by: Starlette Thurow N on: 09/23/2023 09:57 AM   Modules accepted: Orders

## 2023-09-23 NOTE — Telephone Encounter (Signed)
 Dr. Suzann, Patient will be scheduled as soon as possible.  @[name] ,  Thanks for your assistance / update. [name]

## 2023-09-23 NOTE — Telephone Encounter (Signed)
 Pharmacy Patient Advocate Encounter  Received notification from [facility] [employer] that Prior Authorization for Skyrizi  360MG /2.4ML (150MG /ML) single-dose prefilled cartridge with on-body injector has been APPROVED from 09-22-2023 to 03-21-2024   PA #/Case ID/Reference #: [id number]

## 2023-09-23 NOTE — Telephone Encounter (Signed)
 Prescription for Skyrizi  maintenance dose 360 mg every 8 weeks sent to [facility] pharmacy.

## 2023-09-26 NOTE — Telephone Encounter (Signed)
 [name], patient is due for 1st Skyrizi  infusion next Friday but she states that she has a cough (possibly allergy related) and possible UTI. She is coming in to have UA/culture completed that you ordered last week. Should she delay Skyrizi  infusion?

## 2023-09-30 ENCOUNTER — Encounter: Payer: Self-pay | Admitting: Pediatrics

## 2023-09-30 NOTE — Progress Notes (Addendum)
 Subjective Patient ID: [name] is a 78 y.o. female.    Patient here for evaluation of recent UTI symptoms and chronic cough.  Patient reports a few weeks ago she started to have urinary frequency and dysuria.  She states she did not have her urine tested and reports symptoms have improved with supportive care.  No symptoms today.  Patient also reports history of bronchiectasis and emphysema.  She reports she has had chronic cough for years.  She is not currently using her steroid inhaler because she also takes budesonide  for her chron's.  She states cough has increased in frequency in the past 30 days.  She denies chest pain or shortness of breath.  She is not sure about wheezing.  Patient denies any fever.  She denies weight loss, in fact she states she has gained about 10 pounds in the past 6 months.  Patient reports she wants to get checked out today because she was supposed to start Skyrizi  this Friday.    History provided by:  Patient Language interpreter used: No     Review of Systems  Constitutional:  Negative for activity change, appetite change, chills, diaphoresis and fever.  HENT:  Positive for postnasal drip (patient reports it is chronic). Negative for congestion, sinus pain and sore throat.   Respiratory:  Positive for cough. Negative for chest tightness, shortness of breath and wheezing.   Cardiovascular:  Negative for chest pain and leg swelling.  Gastrointestinal:  Negative for abdominal pain, diarrhea, nausea and vomiting.  Genitourinary:  Negative for dysuria (resolved), frequency (resolved) and urgency (resolved).  Musculoskeletal:  Negative for back pain.  Skin:  Negative for rash.    Patient History  Allergies: Allergies  Allergen Reactions  . Latex Hives, Other, Rash and Unknown    Redness and skin peeling  Redness and skin peeling  Redness and skin peeling  Redness and skin peeling Redness and skin peeling, , Redness and skin peeling  Redness and skin  peeling Redness and skin peeling    Redness and skin peeling  . Aspirin Other and Unknown    Intestinal Bleeding  Intestinal Bleeding  Intestinal Bleeding  Intestinal Bleeding Intestinal Bleeding, , Intestinal Bleeding  Intestinal Bleeding Intestinal Bleeding    Intestinal Bleeding  . Ciprofloxacin GI intolerance, Nausea And Vomiting, Other and Unknown  . Codeine GI intolerance, Other, Nausea And Vomiting and Unknown  . Lactose Intolerance (Gi) Other    Pt has Crohn's   Pt has Crohn's  . Meperidine Other, GI intolerance, Nausea And Vomiting and Unknown  . Meperidine Hcl Unknown  . Sulfa Antibiotics GI intolerance, Nausea And Vomiting, Swelling and Unknown  . Fentanyl  GI intolerance, Nausea And Vomiting and Other    Makes me very sick  . Prednisone  Itching, Other and Rash    Upset stomach    History reviewed. No pertinent past medical history. History reviewed. No pertinent surgical history. Social History   Socioeconomic History  . Marital status: Married    Spouse name: Not on file  . Number of children: Not on file  . Years of education: Not on file  . Highest education level: Not on file  Occupational History  . Not on file  Tobacco Use  . Smoking status: Former    Types: Cigarettes  . Smokeless tobacco: Never  Substance and Sexual Activity  . Alcohol use: Not on file  . Drug use: Not on file  . Sexual activity: Not on file  Other Topics Concern  .  Not on file  Social History Narrative  . Not on file   History reviewed. No pertinent family history. Current Outpatient Medications on File Prior to Visit  Medication Sig Dispense Refill  . albuterol  (2.5 MG/3ML) 0.083% nebulizer solution Inhale 2.5 mg every 6 (six) hours if needed.    . Budeson-Glycopyrrol-Formoterol  160-9-4.8 MCG/ACT aerosol     . budesonide  EC (Entocort EC ) 3 MG 24 hr capsule TAKE 3 CAPSULES (9 MG TOTAL) BY MOUTH DAILY.    . calcium  carbonate (Super Calcium ) 1500 (600 Ca) MG tablet calcium   carbonate 600 mg calcium  (1,500 mg) tablet    . cetirizine  (ZyrTEC ) 10 MG tablet 10 mg.    . dicyclomine  (Bentyl ) 20 MG tablet dicyclomine  20 mg tablet    . diphenoxylate -atropine  (Lomotil ) 2.5-0.025 MG tablet Take by mouth.    . DULoxetine  (Cymbalta ) 60 MG DR capsule duloxetine  60 mg capsule,delayed release    . fluticasone  (Flonase ) 50 MCG/ACT nasal spray fluticasone  propionate 50 mcg/actuation nasal spray,suspension    . furosemide  (Lasix ) 20 MG tablet if needed.    . losartan  (Cozaar ) 100 MG tablet losartan  100 mg tablet    . Multiple Vitamins-Minerals (Multi Vitamin/Minerals) tablet Take 1 tablet by mouth 1 (one) time each day.    . omeprazole  (PriLOSEC) 40 MG DR capsule omeprazole  40 mg capsule,delayed release    . ondansetron  ODT (Zofran -ODT) 4 MG disintegrating tablet 4mg  ODT q4 hours prn nausea/vomit    . valACYclovir  (Valtrex ) 1 g tablet Take 1,000 mg by mouth 2 (two) times a day if needed.     No current facility-administered medications on file prior to visit.    Objective  Vitals:   09/30/23 1249  BP: (!) 161/67  BP Location: Left arm  Patient Position: Sitting  Pulse: 65  Resp: 18  Temp: 36.6 C (97.9 F)  TempSrc: Oral  SpO2: 95%  Weight: 54.4 kg  Height: 5'  PainSc: 0-No pain               No results found.  Physical Exam Vitals and nursing note reviewed.  Constitutional:      General: She is not in acute distress.    Appearance: Normal appearance. She is not ill-appearing, toxic-appearing or diaphoretic.  HENT:     Head: Normocephalic and atraumatic.     Nose: Nose normal.     Mouth/Throat:     Mouth: Mucous membranes are moist.     Pharynx: Oropharynx is clear.  Eyes:     Conjunctiva/sclera: Conjunctivae normal.     Pupils: Pupils are equal, round, and reactive to light.  Cardiovascular:     Rate and Rhythm: Normal rate and regular rhythm.     Pulses: Normal pulses.  Pulmonary:     Effort: Pulmonary effort is normal. No respiratory distress.      Breath sounds: Wheezing (mild expiratory wheeze) present. No rhonchi or rales.  Musculoskeletal:     Cervical back: Normal range of motion and neck supple.  Skin:    General: Skin is warm.  Neurological:     Mental Status: She is alert.     Results for orders placed or performed in visit on 09/30/23  XR chest 2 views   Narrative   Examination Description: Chest xray, frontal and lateral views  Comparisons: None provided.    Findings The cardiomediastinal silhouette is unremarkable.  Chronic emphysematous changes without focal consolidation.   No pleural effusion is noted.   There is no pneumothorax present.   Severe scoliotic  curvature of the thoracolumbar spine.     Impression   Chronic emphysematous changes without focal consolidation.  Electronically signed by: Glendia MORTON Lum, M.D. on 09/30/2023 14:09:28  POCT urinalysis dipstick manually resulted  Component Result   Color, UA Yellow   Clarity, UA Clear   Glucose, UA Negative   Bilirubin, UA Negative   Ketones, UA Negative   Spec Grav, UA 1.015   Blood, UA Negative   pH, UA 5.5   Protein, UA Negative   Urobilinogen, UA Negative   Leukocytes, UA Negative   Nitrite, UA Negative       Procedures MDM:     1 Acute, uncomplicated illness or injury     Explanation of Medical Decision Making and variances from expected care:  Patient stable, no acute distress.  Chest x ray negative.  Urine analysis normal.  Will order urine culture.  Advised to continue treatment for chronic cough and emphysema as directed by her pulmonologist.     Unique ordered tests: One     Risk:: Low            Assessment/Plan Diagnoses and all orders for this visit:  Chronic cough -     XR chest 2 views  History of emphysema -     XR chest 2 views  History of bronchiectasis -     XR chest 2 views  History of UTI -     POCT urinalysis dipstick manually resulted -     Urine Culture, Routine     Disposition  Status: Home  Patient Instructions  Urine analysis in clinic is normal.  Will order urine culture Preliminary review of x ray negative for acute findings.  Will have radiologist review and will contact if there is a discrepancy Continue your treatment plan for your centrilobular emphysema and bronchiectasis as outlined by your pulmonologist Seek further medical evaluation if symptoms worsen or fail to improve   Progress note signed by Eva Rase, PA on 09/30/23 at  8:18 PM

## 2023-10-02 ENCOUNTER — Encounter (INDEPENDENT_AMBULATORY_CARE_PROVIDER_SITE_OTHER): Payer: Self-pay | Admitting: Otolaryngology

## 2023-10-02 ENCOUNTER — Ambulatory Visit (INDEPENDENT_AMBULATORY_CARE_PROVIDER_SITE_OTHER): Admitting: Audiology

## 2023-10-02 ENCOUNTER — Other Ambulatory Visit (HOSPITAL_COMMUNITY): Payer: Self-pay

## 2023-10-02 ENCOUNTER — Ambulatory Visit (INDEPENDENT_AMBULATORY_CARE_PROVIDER_SITE_OTHER): Admitting: Otolaryngology

## 2023-10-02 VITALS — BP 129/75 | HR 81 | Ht 60.0 in | Wt 114.0 lb

## 2023-10-02 DIAGNOSIS — H6992 Unspecified Eustachian tube disorder, left ear: Secondary | ICD-10-CM | POA: Diagnosis not present

## 2023-10-02 DIAGNOSIS — J3089 Other allergic rhinitis: Secondary | ICD-10-CM | POA: Diagnosis not present

## 2023-10-02 DIAGNOSIS — Z09 Encounter for follow-up examination after completed treatment for conditions other than malignant neoplasm: Secondary | ICD-10-CM | POA: Diagnosis not present

## 2023-10-02 DIAGNOSIS — J3489 Other specified disorders of nose and nasal sinuses: Secondary | ICD-10-CM

## 2023-10-02 DIAGNOSIS — R42 Dizziness and giddiness: Secondary | ICD-10-CM

## 2023-10-02 DIAGNOSIS — H6522 Chronic serous otitis media, left ear: Secondary | ICD-10-CM

## 2023-10-02 DIAGNOSIS — R0981 Nasal congestion: Secondary | ICD-10-CM

## 2023-10-02 DIAGNOSIS — H903 Sensorineural hearing loss, bilateral: Secondary | ICD-10-CM | POA: Diagnosis not present

## 2023-10-02 DIAGNOSIS — Z9629 Presence of other otological and audiological implants: Secondary | ICD-10-CM

## 2023-10-02 DIAGNOSIS — Z9622 Myringotomy tube(s) status: Secondary | ICD-10-CM

## 2023-10-02 DIAGNOSIS — H938X2 Other specified disorders of left ear: Secondary | ICD-10-CM

## 2023-10-02 DIAGNOSIS — J342 Deviated nasal septum: Secondary | ICD-10-CM

## 2023-10-02 MED ORDER — MECLIZINE HCL 25 MG PO TABS: 25.0000 mg | ORAL_TABLET | Freq: Three times a day (TID) | ORAL | 0 refills | Status: AC | PRN

## 2023-10-02 NOTE — Patient Instructions (Signed)
 Aureliano Med Nasal Saline Rinse   - start nasal saline rinses with NeilMed Bottle available over the counter    Nasal Saline Irrigation instructions: If you choose to make your own salt water  solution, You will need: Salt (kosher, canning, or pickling salt) Baking soda Nasal irrigation bottle (i.e. Aureliano Med Sinus Rinse) Measuring spoon ( teaspoon) Distilled / boiled water    Mix solution Mix 1 teaspoon of salt, 1/2 teaspoon of baking soda and 1 cup of water  into irrigation bottle ** May use saline packet instead of homemade recipe for this step if you prefer If medicine was prescribed to be mixed with solution, place this into bottle Examples 2 inches of 2% mupirocin  ointment Budesonide  solution Position your head: Lean over sink (about 45 degrees) Rotate head (about 45 degrees) so that one nostril is above the other Irrigate Insert tip of irrigation bottle into upper nostril so it forms a comfortable seal Irrigate while breathing through your mouth May remove the straw from the bottle in order to irrigate the entire solution (important if medicine was added) Exhale through nose when finished and blow nose as necessary  Repeat on opposite side with other 1/2 of solution (120 mL) or remake solution if all 240 mL was used on first side Wash irrigation bottle regularly, replace every 3 months    Ayr gel: Apply a pea sized amount up to 4 times daily just to inside of each nostril like above, then pinch your nose for 10 seconds. Use this consistently.

## 2023-10-02 NOTE — Progress Notes (Signed)
  [address] [place], [place] [address] [phone]  Audiological Evaluation    Name: [name]     DOB:   1945/07/30      MRN:   [id number]                                                                                     Service Date: 10/02/2023     Accompanied by: unaccompanied to the booth   Patient comes today after Dr. Tobie, ENT sent a referral for a hearing evaluation due to concerns with post operatory hearing status after left pressure equalization tubes was placed.   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  Tinnitus  []    Ear pain/ infections/pressure  []    Balance problems  []    Noise exposure history  []    Previous ear surgeries  [x]  Left PE tube was placed in the left ear.  Family history of hearing loss  []    Amplification  [x]  Has hearing aids from [facility]  Other  []      Otoscopy: Right ear: Clear external ear canal and notable landmarks visualized on the tympanic membrane. Left ear:  Clear external ear canal and pressure equalization tube was visualized.  Tympanometry: Right ear: Type As- Normal external ear canal volume with normal middle ear pressure and low tympanic membrane compliance. Left ear: Type B- Large external ear canal volume with no middle ear pressure peak or tympanic membrane compliance.  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   Speech Audiometry: Right ear- Speech Reception Threshold (SRT) was obtained at 45 dBHL. Left ear-Speech Reception Threshold (SRT) was obtained at 55 dBHL.   Word Recognition Score Tested using NU-6 (recorded) Right ear: 80% was obtained at a presentation level of 80 dBHL with contralateral masking which is deemed as  good . Left ear: 76% was obtained at a presentation  level of 80 dBHL with contralateral masking which is deemed as  fair.   The hearing test results were completed under headphones and results are deemed to be of good reliability. Test technique:  conventional    Impression: There was a significant improvement in the left ear's pure-tone thresholds today when compared to the previous audiogram on file.   Recommendations: Follow up with ENT as scheduled for today. Return for a hearing evaluation if concerns with hearing changes arise or per MD recommendation.   [name], AUD

## 2023-10-03 ENCOUNTER — Ambulatory Visit (INDEPENDENT_AMBULATORY_CARE_PROVIDER_SITE_OTHER)

## 2023-10-03 VITALS — BP 119/71 | HR 75 | Temp 97.8°F | Resp 20 | Ht 60.0 in | Wt 119.2 lb

## 2023-10-03 DIAGNOSIS — K5010 Crohn's disease of large intestine without complications: Secondary | ICD-10-CM | POA: Diagnosis not present

## 2023-10-03 MED ORDER — SODIUM CHLORIDE 0.9 % IV SOLN
600.0000 mg | Freq: Once | INTRAVENOUS | Status: AC
  Administered 2023-10-03: 600 mg via INTRAVENOUS
  Filled 2023-10-03: qty 10

## 2023-10-03 NOTE — Progress Notes (Signed)
 Diagnosis: Crohn's Disease  Provider:  Praveen Mannam MD  Procedure: IV Infusion  IV Type: Peripheral, IV Location: R Antecubital  Skyrizi  (risankizumab -rzaa), Dose: 600 mg  Infusion Start Time: 1153  Infusion Stop Time: 1255  Post Infusion IV Care: Observation period completed and Peripheral IV Discontinued  Discharge: Condition: Good, Destination: Home . AVS Provided  Performed by:  Leita FORBES Miles, LPN

## 2023-10-05 NOTE — Progress Notes (Signed)
 Dear Dr. Theophilus Andrews, Here is my assessment for our mutual patient, [name]. Thank you for allowing me the opportunity to care for your patient. Please do not hesitate to contact me should you have any other questions. Sincerely, Dr. Eldora Blanch  Otolaryngology Clinic Note Referring provider: Dr. Theophilus Andrews HPI:  [name] is a 78 y.o. female kindly referred by Dr. Theophilus Andrews for evaluation of hearing loss and ear fullness  Initial visit (05/2023):  Patient reports: Bilateral progressive hearing loss, left worse than right. Left fullness/ETD symptoms, but denies antecedent event including URI, infection or trauma. Has tried Nasal sprays - tried flonase , did not help. Mild benefit with zyrtec . Does have typical AR symptoms --- all the time. Sneezing, itching nose with PND. No CRS sx or frequent sinus infections.. Does wear HA for gradual onset b/l HL Of note, she does not remember prior myringotomy but prior chart review shows long intermittent left ear problem by Dr. Ethyl with prior myringotomy before moving to [place].  On budesonide  for Crohn's  Patient denies: ear pain, vertigo, drainage, tinnitus Patient also denies barotrauma, vestibular suppressant use, ototoxic medication use Prior ear surgery: denies No significant noise exposure  --------------------------------------------------------- 07/31/2023 She reports that her ear is no better. Continues to have left ear fullness and ETD symptoms. Cannot pop left ear. On flonase  and astelin  without improvement from ear standpoint, but this is improving her nasal breathing. She continues to have some bilateral nasal congestion and typical AR symptoms. She does say that after thinking about it more, she had septum surgery (broke bones) in nose few years ago for her breathing.  No ear pain, drainage, tinnitus. No headaches, retroorbital pain We did discuss her CT    --------------------------------------------------------- 10/02/2023 Has done very well from ETD standpoint after tube. Ear fullness has resolved. Hearing improved. No drainage, using nasal sprays. No pain. Of note, she does report some intermittent vertigo which she reports she has a chronic history of --- unclear antecedent event, denies meniere's sx including fluctuating hearing. Lasts a few mins. Nasal cavity without epistaxis, but persistent congestion. No drainage or facial pressure.  H&N Surgery: Tonsillectomy, prior myringotomy and sinus surgery ([place]?) Personal or FHx of bleeding dz or anesthesia difficulty: no  GLP-1: no AP/AC: no  Tobacco: prior, quit ~1990 (30 pack year)  PMHx: Crohns, Emphysema, RA, HTN, CKD  Independent Review of Additional Tests or Records:  Dr. 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 Ethyl notes (2021): scar inferiorly along left TM, Dx: ETD and HL; Rx: flonase , irrigation, briefly discussed left tube but does not wish for it. CBC, CMP 04/28/2023: WBC 10.6, Eos 200; BUN/Cr 50/2.08 [facility] 11/11/2021 independently interpreted with respect to ears: cuts thick so suboptimal eval but hyperpneumatized mastoids with clear mastoids and ME; no obvious ossicular chain or otic capsule abnormality noted 06/2023 Audiogram was independently reviewed and interpreted by me and it reveals - noted right mild to then mod/sev SNHL with left mixed HL mod/sev with ABG ~20-30 dB; tymps: B AS, A AD   SNHL= Sensorineural hearing loss   CT Temporal bones 06/18/2023 independently interpreted: normal right temporal bone; on left, noted left mastoid effusion and the petrous apex with some bony expansio; some opacification posterior attic - most likely fluid  given does  not appear to have any ossicular chain erosion; ME on left fairly well aerated; scutum intact; tegmen thin on left - no obvious large dehiscence noted   09/2023 Audiogram was independently reviewed and interpreted by me and it reveals - resolution of conductive component on left; now essentially symmetric SNHL, some asymmetry at 500 Hz. As/Lg vol tymps; WRT as below   SNHL= Sensorineural hearing loss   PMH/Meds/All/SocHx/FamHx/ROS:   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   Whooping cough 2015   Whooping cough with pneumonia    2017     Past Surgical History:  Procedure Laterality Date   BIOPSY  11/05/2022   Procedure: BIOPSY;  Surgeon: Albertus Gordy HERO, MD;  Location: [facility] ENDOSCOPY;  Service: Gastroenterology;;   CATARACT EXTRACTION, BILATERAL  2018   with IOC implant    COLONOSCOPY     DEBRIDEMENT AND CLOSURE WOUND Right 10/01/2018   Procedure: Excision of right knee wound with placement of ACell and Pravena;  Surgeon: Lowery Estefana RAMAN, DO;  Location: [facility];  Service: Plastics;  Laterality: Right;  30 min   ESOPHAGOGASTRODUODENOSCOPY (EGD) WITH PROPOFOL  N/A 11/05/2022   Procedure: ESOPHAGOGASTRODUODENOSCOPY (EGD) WITH PROPOFOL ;  Surgeon: Albertus Gordy HERO, MD;  Location: [facility] ENDOSCOPY;  Service: Gastroenterology;  Laterality: N/A;   HERNIA REPAIR     knee fracture  Right    NASAL FRACTURE SURGERY      needed b/c she was getting frequent sinus infections    TONSILLECTOMY     TOTAL KNEE ARTHROPLASTY Right 07/28/2018   Procedure: TOTAL KNEE ARTHROPLASTY;  Surgeon: Ernie Cough, MD;  Location: [facility];  Service: Orthopedics;  Laterality: Right;   TOTAL KNEE ARTHROPLASTY Left 03/02/2019   Procedure: TOTAL KNEE ARTHROPLASTY;  Surgeon: Ernie Cough, MD;  Location: [facility];  Service: Orthopedics;  Laterality: Left;  70 mins   UPPER GASTROINTESTINAL ENDOSCOPY     XI ROBOTIC ASSISTED HIATAL HERNIA REPAIR N/A 01/31/2023   Procedure: XI ROBOTIC ASSISTED HIATAL HERNIA REPAIR WITH MESH;  Surgeon: Stechschulte, Deward PARAS, MD;  Location: [facility];  Service: General;  Laterality: N/A;    Family History  Problem Relation Age of Onset   Diabetes Father    Parkinson's disease Father    Colon cancer Other    Asthma Neg Hx    Esophageal cancer Neg Hx    Pancreatic cancer Neg Hx    Liver disease Neg Hx    Stomach cancer Neg Hx      Social Connections: Patient Declined (02/03/2023)   Social Connection and Isolation Panel    Frequency of Communication with Friends and Family: Patient declined    Frequency of Social Gatherings with Friends and Family: Patient declined    Attends Religious Services: Patient declined    Production manager of [employer]  or Organizations: Patient declined    Attends Banker Meetings: Patient declined    Marital Status: Patient declined  Recent Concern: Social Connections - Moderately Isolated (01/31/2023)   Social Connection and Isolation Panel    Frequency of Communication with Friends and Family: More than three times a week    Frequency of Social Gatherings with Friends and Family: Once a week    Attends Religious Services: Never    Database administrator or Organizations: No    Attends Engineer, structural: Never    Marital Status: Married      Current Outpatient Medications:    albuterol  (VENTOLIN  HFA) 108 (90 Base) MCG/ACT inhaler, Inhale 2 puffs into the  lungs every 6 (six) hours as needed for wheezing or shortness of breath., Disp: 8 g, Rfl: 6   azelastine  (ASTELIN ) 0.1 % nasal spray, Place 2 sprays into both nostrils 2 (two) times daily. Use in each nostril as directed, Disp: 30 mL, Rfl: 12   budesonide  (ENTOCORT EC ) 3 MG 24 hr capsule, TAKE 3 CAPSULES (9 MG TOTAL) BY MOUTH DAILY., Disp: 270 capsule, Rfl: 2   Calcium  Citrate-Vitamin D (CALCIUM  + D PO), Take 1 tablet by mouth 2 (two) times daily., Disp: , Rfl:    cetirizine  (ZYRTEC ) 10 MG tablet, Take 1 tablet (10 mg total) by mouth daily., Disp: 90 tablet, Rfl: 0   dicyclomine  (BENTYL ) 20 MG tablet, Take 1 tablet (20 mg total) by mouth 4 (four) times daily as needed for spasms., Disp: 360 tablet, Rfl: 3   diphenoxylate -atropine  (LOMOTIL ) 2.5-0.025 MG tablet, Take 2 tablets by mouth 3 (three) times daily. TAKE 2 TABLETS BY MOUTH IN THE MORNING ,AT NOON, AND AT BEDTIME AS DIRECTED, Disp: 180 tablet, Rfl: 11   doxycycline  (VIBRAMYCIN ) 100 MG capsule, Take 1 capsule (100 mg total) by mouth 2 (two) times daily., Disp: 14 capsule, Rfl: 0   DULoxetine  (CYMBALTA ) 60 MG capsule, Take 1 capsule (60 mg total) by mouth 2 (two) times daily., Disp: 180 capsule, Rfl: 0   fluticasone  (FLONASE ) 50 MCG/ACT nasal spray, SPRAY 2 SPRAYS INTO EACH NOSTRIL EVERY DAY, Disp: 48 mL, Rfl: 0   furosemide  (LASIX ) 20 MG tablet, if needed., Disp: , Rfl:    losartan  (COZAAR ) 100 MG tablet, Take 1 tablet (100 mg total) by mouth daily., Disp: 90 tablet, Rfl: 0   meclizine  (ANTIVERT ) 25 MG tablet, Take 1 tablet (25 mg total) by mouth 3 (three) times daily as needed for dizziness., Disp: 30 tablet, Rfl: 0   Multiple Vitamin (MULTIVITAMIN WITH MINERALS) TABS tablet, Take 1 tablet by mouth daily., Disp: , Rfl:    mupirocin  ointment (BACTROBAN ) 2 %, Apply 1 Application topically., Disp: , Rfl:    ofloxacin  (FLOXIN ) 0.3 % OTIC solution, PLACE 4 DROPS INTO BOTH EARS 2 (TWO) TIMES DAILY FOR 5 DAYS., Disp: , Rfl:    omeprazole  (PRILOSEC) 40  MG capsule, Take 1 capsule (40 mg total) by mouth daily., Disp: 90 capsule, Rfl: 3   ondansetron  (ZOFRAN ) 4 MG tablet, Take 1 tablet (4 mg total) by mouth every 8 (eight) hours as needed for nausea or vomiting., Disp: 90 tablet, Rfl: 0   ondansetron  (ZOFRAN -ODT) 4 MG disintegrating tablet, 4mg  ODT q4 hours prn nausea/vomit, Disp: 20 tablet, Rfl: 0   [START ON 01/05/2024] SKYRIZI  360 MG/2.4ML SOCT, Inject 360 mg into the skin every 8 (eight) weeks. Give approximately on 12/29 or after., Disp: 2.4 mL, Rfl: 5   triamcinolone  cream (KENALOG ) 0.1 %,  Apply topically., Disp: , Rfl:    valACYclovir  (VALTREX ) 1000 MG tablet, TAKE 1 TABLET BY MOUTH TWICE A DAY AS NEEDED, Disp: 180 tablet, Rfl: 0   Physical Exam:   BP 129/75 (BP Location: Right Arm, Patient Position: Sitting, Cuff Size: Normal)   Pulse 81   Ht 5' (1.524 m)   Wt 114 lb (51.7 kg)   BMI 22.26 kg/m   Salient findings:  CN II-XII intact Given history and complaints, ear microscopy was indicated and performed for evaluation with findings as below in physical exam section and in procedures. Bilateral EAC clear; left TM PET in place, patent, dry; anterior monomeric area; right TM intact with well aerated ME Weber 512: mid Rinne 512: AC > BC b/l Anterior rhinoscopy: Septum mild dev right; bilateral inferior turbinates without significant hypertrophy; persistent small area dorsal left septum with some crusting which is bloody and small area of exposed cartilage Gait normal, no nystagmus No respiratory distress or stridor   Seprately Identifiable Procedures:  Prior to initiating any procedures, risks/benefits/alternatives were explained to the patient and verbal consent obtained. Procedure: Bilateral ear microscopy using microscope (CPT [phone]) Pre-procedure diagnosis: left PE tube placement, left chronic serous otitis media Post-procedure diagnosis: same Indication: see above; given patient's otologic complaints and history, for improved and  comprehensive examination of external ear and tympanic membrane, bilateral otologic examination using microscope was performed. Prior to proceeding, verbal consent was obtained after discussion of R/B/A  Procedure: Patient was placed semi-recumbent. Both ear canals were examined using the microscope with findings above. Patient tolerated the procedure well.  Patient tolerated the procedure well    Impression & Plans:  [name] is a 78 y.o. female with:  1. Left chronic serous otitis media   2. Dysfunction of left eustachian tube   3. Sensation of fullness in left ear   4. Perennial allergic rhinitis   5. Sensorineural hearing loss (SNHL) of both ears   6. Nasal obstruction   7. Nasal septal deviation   8. Vertigo   9. S/P tympanostomy tube placement   10. Nasal congestion    She does not remember left tymp tube but appears to have long-standing issues on left ear; noted B tymp with serous effusion despite medical management; endo without ET mass; Temporal bone without obvious large tegmen dehiscence. We discussed options and ultimately decided on left tymp tube placement. Doing well after with conductive component resolved.  From nasal standpoint, wonder if the concavity on left is due to prior procedure. Not infected, no AI sx. Will continue to aggressively humidify nose and continue flonase  and astelin  since helping with her sx - Will avoid steroids given on budesonide  for crohn's - daily rinses - flonase  and astelin  bid - Wonder if empty nose(?) as nasal cavity looks widely patent; will continue to observe small amt of exposed cartilage over dorsum  Vertigo: chronic, does not appear to have meniere's like sx; we discussed DDX and options including vestibular rehab but she wishes to forego; will trial meclizine  PRN; return precautions discussed; if persists, call back  See below regarding exact medications prescribed this encounter including dosages and route: Meds ordered  this encounter  Medications   meclizine  (ANTIVERT ) 25 MG tablet    Sig: Take 1 tablet (25 mg total) by mouth 3 (three) times daily as needed for dizziness.    Dispense:  30 tablet    Refill:  0      Thank you for allowing me the opportunity to care  for your patient. Please do not hesitate to contact me should you have any other questions.  Sincerely, Eldora Blanch, MD Otolaryngologist (ENT), [facility] Phone: [phone] Fax: [phone]  10/05/2023, 4:52 PM   MDM:  Level 4 - 99214 Complexity/Problems addressed: mod - chronic issues Data complexity: low - Morbidity: mod  - Drug prescribed or managed: y

## 2023-10-09 ENCOUNTER — Other Ambulatory Visit (HOSPITAL_COMMUNITY): Payer: Self-pay

## 2023-10-12 ENCOUNTER — Encounter: Payer: Self-pay | Admitting: Internal Medicine

## 2023-10-13 MED ORDER — ONDANSETRON HCL 4 MG PO TABS: 4.0000 mg | ORAL_TABLET | Freq: Three times a day (TID) | ORAL | 0 refills | Status: AC | PRN

## 2023-10-29 ENCOUNTER — Ambulatory Visit: Admitting: Internal Medicine

## 2023-10-29 ENCOUNTER — Encounter: Payer: Self-pay | Admitting: Internal Medicine

## 2023-10-29 VITALS — BP 130/84 | HR 60 | Temp 97.7°F | Wt 118.2 lb

## 2023-10-29 DIAGNOSIS — R252 Cramp and spasm: Secondary | ICD-10-CM

## 2023-10-29 DIAGNOSIS — R509 Fever, unspecified: Secondary | ICD-10-CM

## 2023-10-29 DIAGNOSIS — R0902 Hypoxemia: Secondary | ICD-10-CM

## 2023-10-29 DIAGNOSIS — M791 Myalgia, unspecified site: Secondary | ICD-10-CM | POA: Diagnosis not present

## 2023-10-29 DIAGNOSIS — N3000 Acute cystitis without hematuria: Secondary | ICD-10-CM

## 2023-10-29 DIAGNOSIS — K50918 Crohn's disease, unspecified, with other complication: Secondary | ICD-10-CM

## 2023-10-29 LAB — POCT INFLUENZA A/B
Influenza A, POC: NEGATIVE
Influenza B, POC: NEGATIVE

## 2023-10-29 LAB — POC URINALSYSI DIPSTICK (AUTOMATED)
Bilirubin, UA: NEGATIVE
Blood, UA: NEGATIVE
Glucose, UA: NEGATIVE
Ketones, UA: NEGATIVE
Nitrite, UA: NEGATIVE
Protein, UA: POSITIVE — AB
Spec Grav, UA: 1.015 (ref 1.010–1.025)
Urobilinogen, UA: 0.2 U/dL
pH, UA: 5.5 (ref 5.0–8.0)

## 2023-10-29 LAB — POC COVID19 BINAXNOW: SARS Coronavirus 2 Ag: NEGATIVE

## 2023-10-29 MED ORDER — CEPHALEXIN 500 MG PO CAPS: 500.0000 mg | ORAL_CAPSULE | Freq: Two times a day (BID) | ORAL | 0 refills | Status: DC

## 2023-10-29 NOTE — Progress Notes (Signed)
 Established Patient Office Visit     CC/Reason for Visit: Body aches and fever  HPI: [name] is a 78 y.o. female who is coming in today for the above mentioned reasons.  All of a sudden yesterday she had the acute onset of severe body weakness and aches.  When she got home she had a temp of 100.5.  She has some chronic cough and shortness of breath but but not above her baseline.  She was started on Skyrizi  last month for her Crohn's disease by GI.  She denies chest pain, dysuria.  She was treated for UTI 2 weeks ago at urgent care with Macrobid.  She tells me her culture was positive for E. coli.  No URI symptoms.  Has also been complaining of terrible leg cramps that will wake her up at night.  This has been ongoing for some time.   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   Whooping cough 2015   Whooping cough with pneumonia    2017    Past Surgical History:  Procedure Laterality Date   BIOPSY  11/05/2022   Procedure: BIOPSY;  Surgeon: Albertus Gordy HERO, MD;  Location: [facility] ENDOSCOPY;  Service: Gastroenterology;;   CATARACT EXTRACTION, BILATERAL  2018   with IOC implant    COLONOSCOPY     DEBRIDEMENT AND CLOSURE WOUND Right 10/01/2018   Procedure: Excision of right knee wound with placement of ACell and Pravena;  Surgeon: Lowery Estefana RAMAN, DO;  Location: [facility];   Service: Plastics;  Laterality: Right;  30 min   ESOPHAGOGASTRODUODENOSCOPY (EGD) WITH PROPOFOL  N/A 11/05/2022   Procedure: ESOPHAGOGASTRODUODENOSCOPY (EGD) WITH PROPOFOL ;  Surgeon: Albertus Gordy HERO, MD;  Location: [facility] ENDOSCOPY;  Service: Gastroenterology;  Laterality: N/A;   HERNIA REPAIR     knee fracture  Right    NASAL FRACTURE SURGERY     needed b/c she was getting frequent sinus infections    TONSILLECTOMY     TOTAL KNEE ARTHROPLASTY Right 07/28/2018   Procedure: TOTAL KNEE ARTHROPLASTY;  Surgeon: Ernie Cough, MD;  Location: [facility];  Service: Orthopedics;  Laterality: Right;   TOTAL KNEE ARTHROPLASTY Left 03/02/2019   Procedure: TOTAL KNEE ARTHROPLASTY;  Surgeon: Ernie Cough, MD;  Location: [facility];  Service: Orthopedics;  Laterality: Left;  70 mins   UPPER GASTROINTESTINAL ENDOSCOPY     XI ROBOTIC ASSISTED HIATAL HERNIA REPAIR N/A 01/31/2023   Procedure: XI ROBOTIC ASSISTED HIATAL HERNIA REPAIR WITH MESH;  Surgeon: Stechschulte, Deward PARAS, MD;  Location: [facility];  Service: General;  Laterality: N/A;    Social History:  reports that she quit smoking about 35 years ago. Her smoking use included cigarettes. She started smoking about 65 years ago. She has a 30 pack-year  smoking history. She has never used smokeless tobacco. She reports that she does not currently use alcohol. She reports that she does not use drugs.  Allergies: Allergies  Allergen Reactions   Aspirin Other (See Comments)    Intestinal Bleeding   Latex Other (See Comments)    Redness and skin peeling   Other Itching    Nuts-itching in throat  Seeds-stomach issues with chrons  Pt has emphysema    Sulfa Antibiotics Nausea And Vomiting, Nausea Only, Other (See Comments) and Swelling   Adhesive [Tape] Other (See Comments)    Redness and skin peeling   Ciprofloxacin Nausea And Vomiting   Codeine Nausea And Vomiting   Demerol [Meperidine Hcl] Nausea And Vomiting   Fentanyl  Nausea And Vomiting    Makes me very sick    Lactose Intolerance (Gi) Other (See Comments)    Pt has Crohn's    Prednisone  Other (See Comments)    Upset stomach   Septra [Sulfamethoxazole-Trimethoprim] Nausea Only    Family History:  Family History  Problem Relation Age of Onset   Diabetes Father    Parkinson's disease Father    Colon cancer Other    Asthma Neg Hx    Esophageal cancer Neg Hx    Pancreatic cancer Neg Hx    Liver disease Neg Hx    Stomach cancer Neg Hx      Current Outpatient Medications:    azelastine  (ASTELIN ) 0.1 % nasal spray, Place 2 sprays into both nostrils 2 (two) times daily. Use in each nostril as directed, Disp: 30 mL, Rfl: 12   budesonide  (ENTOCORT EC ) 3 MG 24 hr capsule, TAKE 3 CAPSULES (9 MG TOTAL) BY MOUTH DAILY., Disp: 270 capsule, Rfl: 2   Calcium  Citrate-Vitamin D (CALCIUM  + D PO), Take 1 tablet by mouth 2 (two) times daily., Disp: , Rfl:    cephALEXin  (KEFLEX ) 500 MG capsule, Take 1 capsule (500 mg total) by mouth 2 (two) times daily for 7 days., Disp: 14 capsule, Rfl: 0   cetirizine  (ZYRTEC ) 10 MG tablet, Take 1 tablet (10 mg total) by mouth daily., Disp: 90 tablet, Rfl: 0   dicyclomine  (BENTYL ) 20 MG tablet, Take 1 tablet (20 mg total) by mouth 4 (four) times daily as needed for spasms., Disp: 360 tablet, Rfl: 3   diphenoxylate -atropine  (LOMOTIL ) 2.5-0.025 MG tablet, Take 2 tablets by mouth 3 (three) times daily. TAKE 2 TABLETS BY MOUTH IN THE MORNING ,AT NOON, AND AT BEDTIME AS DIRECTED, Disp: 180 tablet, Rfl: 11   DULoxetine  (CYMBALTA ) 60 MG capsule, Take 1 capsule (60 mg total) by mouth 2 (two) times daily., Disp: 180 capsule, Rfl: 0   fluticasone  (FLONASE ) 50 MCG/ACT nasal spray, SPRAY 2 SPRAYS INTO EACH NOSTRIL EVERY DAY, Disp: 48 mL, Rfl: 0   furosemide  (LASIX ) 20 MG tablet, if needed., Disp: , Rfl:    losartan  (COZAAR ) 100 MG tablet, Take 1 tablet (100 mg total) by mouth daily., Disp: 90 tablet, Rfl: 0   Multiple Vitamin (MULTIVITAMIN WITH MINERALS) TABS tablet, Take 1 tablet by mouth  daily., Disp: , Rfl:    mupirocin  ointment (BACTROBAN ) 2 %, Apply 1 Application topically., Disp: , Rfl:    omeprazole  (PRILOSEC) 40 MG capsule, Take 1 capsule (40 mg total) by mouth daily., Disp: 90 capsule, Rfl: 3   ondansetron  (ZOFRAN ) 4 MG tablet, Take 1 tablet (4 mg total) by mouth every 8 (eight) hours as needed for nausea or vomiting., Disp: 90 tablet, Rfl: 0   [START ON 01/05/2024] SKYRIZI   360 MG/2.4ML SOCT, Inject 360 mg into the skin every 8 (eight) weeks. Give approximately on 12/29 or after., Disp: 2.4 mL, Rfl: 5   triamcinolone  cream (KENALOG ) 0.1 %, Apply topically., Disp: , Rfl:    valACYclovir  (VALTREX ) 1000 MG tablet, TAKE 1 TABLET BY MOUTH TWICE A DAY AS NEEDED, Disp: 180 tablet, Rfl: 0   albuterol  (VENTOLIN  HFA) 108 (90 Base) MCG/ACT inhaler, Inhale 2 puffs into the lungs every 6 (six) hours as needed for wheezing or shortness of breath., Disp: 8 g, Rfl: 6   doxycycline  (VIBRAMYCIN ) 100 MG capsule, Take 1 capsule (100 mg total) by mouth 2 (two) times daily., Disp: 14 capsule, Rfl: 0   meclizine  (ANTIVERT ) 25 MG tablet, Take 1 tablet (25 mg total) by mouth 3 (three) times daily as needed for dizziness., Disp: 30 tablet, Rfl: 0   ofloxacin  (FLOXIN ) 0.3 % OTIC solution, PLACE 4 DROPS INTO BOTH EARS 2 (TWO) TIMES DAILY FOR 5 DAYS., Disp: , Rfl:    ondansetron  (ZOFRAN -ODT) 4 MG disintegrating tablet, 4mg  ODT q4 hours prn nausea/vomit, Disp: 20 tablet, Rfl: 0  Review of Systems:  Negative unless indicated in HPI.   Physical Exam: Vitals:   10/29/23 1601  BP: 130/84  Pulse: 60  Temp: 97.7 F (36.5 C)  TempSrc: Oral  SpO2: 90%  Weight: 118 lb 3.2 oz (53.6 kg)    Body mass index is 23.08 kg/m.   Physical Exam Vitals reviewed.  Constitutional:      Appearance: Normal appearance.  HENT:     Head: Normocephalic and atraumatic.  Eyes:     Conjunctiva/sclera: Conjunctivae normal.     Pupils: Pupils are equal, round, and reactive to light.  Cardiovascular:     Rate and  Rhythm: Normal rate and regular rhythm.  Pulmonary:     Effort: Pulmonary effort is normal.     Breath sounds: Normal breath sounds.  Skin:    General: Skin is warm and dry.  Neurological:     General: No focal deficit present.     Mental Status: She is alert and oriented to person, place, and time.  Psychiatric:        Mood and Affect: Mood normal.        Behavior: Behavior normal.        Thought Content: Thought content normal.        Judgment: Judgment normal.      Impression and Plan:  Myalgia -     POCT Influenza A/B -     POC COVID-19 BinaxNow -     POCT Urinalysis Dipstick (Automated) -     Urine Culture; Future  Fever, unspecified fever cause -     POCT Influenza A/B -     POC COVID-19 BinaxNow -     POCT Urinalysis Dipstick (Automated) -     Urine Culture; Future  Hypoxemia -     CBC with Differential/Platelet; Future -     Comprehensive metabolic panel with GFR; Future -     DG Chest 2 View; Future  Leg cramps -     Magnesium ; Future  Crohn's disease with other complication, unspecified gastrointestinal tract location (HCC) -     IBC + Ferritin; Future  Acute cystitis without hematuria -     Cephalexin ; Take 1 capsule (500 mg total) by mouth 2 (two) times daily for 7 days.  Dispense: 14 capsule; Refill: 0  -Concerned about her hypoxemia with 90%.  Check COVID and flu PCR, send for chest  x-ray.  Will also do a comprehensive workup including labs and a urine to try and find an etiology for acute body aches and fever.  Further workup pending results.  Addendum: - In office flu and COVID tests are negative. - In office urine dipstick positive for leukocytes.  Will send for urine culture and treat with Keflex .   Time spent:33 minutes reviewing chart, interviewing and examining patient and formulating plan of care.     Tully Theophilus Andrews, MD  Primary Care at [facility]

## 2023-10-30 ENCOUNTER — Other Ambulatory Visit (INDEPENDENT_AMBULATORY_CARE_PROVIDER_SITE_OTHER)

## 2023-10-30 ENCOUNTER — Ambulatory Visit
Admission: RE | Admit: 2023-10-30 | Discharge: 2023-10-30 | Disposition: A | Source: Ambulatory Visit | Attending: Internal Medicine | Admitting: Internal Medicine

## 2023-10-30 DIAGNOSIS — R0902 Hypoxemia: Secondary | ICD-10-CM

## 2023-10-30 DIAGNOSIS — K50918 Crohn's disease, unspecified, with other complication: Secondary | ICD-10-CM | POA: Diagnosis not present

## 2023-10-30 DIAGNOSIS — R252 Cramp and spasm: Secondary | ICD-10-CM

## 2023-10-30 LAB — COMPREHENSIVE METABOLIC PANEL WITH GFR
ALT: 10 U/L (ref 0–35)
AST: 12 U/L (ref 0–37)
Albumin: 3.7 g/dL (ref 3.5–5.2)
Alkaline Phosphatase: 75 U/L (ref 39–117)
BUN: 45 mg/dL — ABNORMAL HIGH (ref 6–23)
CO2: 23 meq/L (ref 19–32)
Calcium: 9.3 mg/dL (ref 8.4–10.5)
Chloride: 102 meq/L (ref 96–112)
Creatinine, Ser: 1.25 mg/dL — ABNORMAL HIGH (ref 0.40–1.20)
GFR: 41.33 mL/min — ABNORMAL LOW (ref >60.00)
Glucose, Bld: 111 mg/dL — ABNORMAL HIGH (ref 70–99)
Potassium: 4.4 meq/L (ref 3.5–5.1)
Sodium: 135 meq/L (ref 135–145)
Total Bilirubin: 0.3 mg/dL (ref 0.2–1.2)
Total Protein: 7.7 g/dL (ref 6.0–8.3)

## 2023-10-30 LAB — CBC WITH DIFFERENTIAL/PLATELET
Basophils Absolute: 0.1 K/uL (ref 0.0–0.1)
Basophils Relative: 0.9 % (ref 0.0–3.0)
Eosinophils Absolute: 0.7 K/uL (ref 0.0–0.7)
Eosinophils Relative: 5.3 % — ABNORMAL HIGH (ref 0.0–5.0)
HCT: 34.5 % — ABNORMAL LOW (ref 36.0–46.0)
Hemoglobin: 11.1 g/dL — ABNORMAL LOW (ref 12.0–15.0)
Lymphocytes Relative: 10.9 % — ABNORMAL LOW (ref 12.0–46.0)
Lymphs Abs: 1.3 K/uL (ref 0.7–4.0)
MCHC: 32.2 g/dL (ref 30.0–36.0)
MCV: 89.1 fl (ref 78.0–100.0)
Monocytes Absolute: 1.1 K/uL — ABNORMAL HIGH (ref 0.1–1.0)
Monocytes Relative: 8.7 % (ref 3.0–12.0)
Neutro Abs: 9.1 K/uL — ABNORMAL HIGH (ref 1.4–7.7)
Neutrophils Relative %: 74.2 % (ref 43.0–77.0)
Platelets: 381 K/uL (ref 150.0–400.0)
RBC: 3.88 Mil/uL (ref 3.87–5.11)
RDW: 15.6 % — ABNORMAL HIGH (ref 11.5–15.5)
WBC: 12.3 K/uL — ABNORMAL HIGH (ref 4.0–10.5)

## 2023-10-30 LAB — IBC + FERRITIN
Ferritin: 78.1 ng/mL (ref 10.0–291.0)
Iron: 11 ug/dL — ABNORMAL LOW (ref 42–145)
Saturation Ratios: 4.2 % — ABNORMAL LOW (ref 20.0–50.0)
TIBC: 259 ug/dL (ref 250.0–450.0)
Transferrin: 185 mg/dL — ABNORMAL LOW (ref 212.0–360.0)

## 2023-10-30 LAB — MAGNESIUM: Magnesium: 1.6 mg/dL (ref 1.5–2.5)

## 2023-10-31 ENCOUNTER — Ambulatory Visit

## 2023-10-31 VITALS — BP 145/82 | HR 73 | Temp 97.9°F | Resp 16 | Ht 60.0 in | Wt 120.2 lb

## 2023-10-31 DIAGNOSIS — K5010 Crohn's disease of large intestine without complications: Secondary | ICD-10-CM | POA: Diagnosis not present

## 2023-10-31 LAB — URINE CULTURE
MICRO NUMBER:: 17139412
SPECIMEN QUALITY:: ADEQUATE

## 2023-10-31 MED ORDER — SODIUM CHLORIDE 0.9 % IV SOLN
600.0000 mg | Freq: Once | INTRAVENOUS | Status: AC
  Administered 2023-10-31: 600 mg via INTRAVENOUS
  Filled 2023-10-31: qty 10

## 2023-10-31 NOTE — Progress Notes (Signed)
 Diagnosis: Crohn's Disease  Provider:  Praveen Mannam MD  Procedure: IV Infusion  IV Type: Peripheral, IV Location: R Antecubital  Skyrizi  (risankizumab -rzaa), Dose: 600 mg  Infusion Start Time: 1157  Infusion Stop Time: 1302  Post Infusion IV Care: Peripheral IV Discontinued  Discharge: Condition: Good, Destination: Home . AVS Provided  Performed by:  Eleanor DELENA Bloch, RN

## 2023-11-03 ENCOUNTER — Ambulatory Visit: Payer: Self-pay | Admitting: Internal Medicine

## 2023-11-03 DIAGNOSIS — F339 Major depressive disorder, recurrent, unspecified: Secondary | ICD-10-CM

## 2023-11-03 DIAGNOSIS — D509 Iron deficiency anemia, unspecified: Secondary | ICD-10-CM

## 2023-11-04 ENCOUNTER — Other Ambulatory Visit: Payer: Self-pay

## 2023-11-04 ENCOUNTER — Emergency Department (HOSPITAL_COMMUNITY)

## 2023-11-04 ENCOUNTER — Encounter: Payer: Self-pay | Admitting: Pediatrics

## 2023-11-04 ENCOUNTER — Encounter (HOSPITAL_COMMUNITY): Payer: Self-pay

## 2023-11-04 ENCOUNTER — Inpatient Hospital Stay (HOSPITAL_COMMUNITY)
Admission: EM | Admit: 2023-11-04 | Discharge: 2023-11-12 | DRG: 371 | Disposition: A | Discharge status: Home Health | Attending: Internal Medicine | Admitting: Internal Medicine

## 2023-11-04 DIAGNOSIS — H9190 Unspecified hearing loss, unspecified ear: Secondary | ICD-10-CM | POA: Diagnosis present

## 2023-11-04 DIAGNOSIS — K529 Noninfective gastroenteritis and colitis, unspecified: Principal | ICD-10-CM | POA: Diagnosis present

## 2023-11-04 DIAGNOSIS — E8770 Fluid overload, unspecified: Secondary | ICD-10-CM | POA: Diagnosis present

## 2023-11-04 DIAGNOSIS — N170 Acute kidney failure with tubular necrosis: Principal | ICD-10-CM | POA: Diagnosis present

## 2023-11-04 DIAGNOSIS — E871 Hypo-osmolality and hyponatremia: Secondary | ICD-10-CM | POA: Diagnosis present

## 2023-11-04 DIAGNOSIS — Z886 Allergy status to analgesic agent status: Secondary | ICD-10-CM

## 2023-11-04 DIAGNOSIS — E876 Hypokalemia: Secondary | ICD-10-CM | POA: Diagnosis present

## 2023-11-04 DIAGNOSIS — Z882 Allergy status to sulfonamides status: Secondary | ICD-10-CM

## 2023-11-04 DIAGNOSIS — K2100 Gastro-esophageal reflux disease with esophagitis, without bleeding: Secondary | ICD-10-CM | POA: Diagnosis present

## 2023-11-04 DIAGNOSIS — K50112 Crohn's disease of large intestine with intestinal obstruction: Secondary | ICD-10-CM | POA: Diagnosis present

## 2023-11-04 DIAGNOSIS — K449 Diaphragmatic hernia without obstruction or gangrene: Secondary | ICD-10-CM | POA: Diagnosis present

## 2023-11-04 DIAGNOSIS — I129 Hypertensive chronic kidney disease with stage 1 through stage 4 chronic kidney disease, or unspecified chronic kidney disease: Secondary | ICD-10-CM | POA: Diagnosis present

## 2023-11-04 DIAGNOSIS — Z87440 Personal history of urinary (tract) infections: Secondary | ICD-10-CM

## 2023-11-04 DIAGNOSIS — E875 Hyperkalemia: Secondary | ICD-10-CM | POA: Diagnosis not present

## 2023-11-04 DIAGNOSIS — I10 Essential (primary) hypertension: Secondary | ICD-10-CM | POA: Insufficient documentation

## 2023-11-04 DIAGNOSIS — N1832 Chronic kidney disease, stage 3b: Secondary | ICD-10-CM | POA: Diagnosis present

## 2023-11-04 DIAGNOSIS — N179 Acute kidney failure, unspecified: Secondary | ICD-10-CM | POA: Insufficient documentation

## 2023-11-04 DIAGNOSIS — D509 Iron deficiency anemia, unspecified: Secondary | ICD-10-CM | POA: Diagnosis present

## 2023-11-04 DIAGNOSIS — A040 Enteropathogenic Escherichia coli infection: Principal | ICD-10-CM | POA: Diagnosis present

## 2023-11-04 DIAGNOSIS — Z96653 Presence of artificial knee joint, bilateral: Secondary | ICD-10-CM | POA: Diagnosis present

## 2023-11-04 DIAGNOSIS — K5010 Crohn's disease of large intestine without complications: Secondary | ICD-10-CM | POA: Diagnosis present

## 2023-11-04 DIAGNOSIS — Z8739 Personal history of other diseases of the musculoskeletal system and connective tissue: Secondary | ICD-10-CM

## 2023-11-04 DIAGNOSIS — E860 Dehydration: Secondary | ICD-10-CM | POA: Diagnosis present

## 2023-11-04 DIAGNOSIS — Z87891 Personal history of nicotine dependence: Secondary | ICD-10-CM

## 2023-11-04 DIAGNOSIS — K862 Cyst of pancreas: Secondary | ICD-10-CM | POA: Diagnosis present

## 2023-11-04 DIAGNOSIS — I2510 Atherosclerotic heart disease of native coronary artery without angina pectoris: Secondary | ICD-10-CM | POA: Diagnosis present

## 2023-11-04 DIAGNOSIS — G4700 Insomnia, unspecified: Secondary | ICD-10-CM | POA: Diagnosis present

## 2023-11-04 DIAGNOSIS — F411 Generalized anxiety disorder: Secondary | ICD-10-CM | POA: Diagnosis present

## 2023-11-04 DIAGNOSIS — K909 Intestinal malabsorption, unspecified: Secondary | ICD-10-CM | POA: Diagnosis present

## 2023-11-04 DIAGNOSIS — Z91040 Latex allergy status: Secondary | ICD-10-CM

## 2023-11-04 DIAGNOSIS — E8721 Acute metabolic acidosis: Secondary | ICD-10-CM | POA: Diagnosis present

## 2023-11-04 DIAGNOSIS — J439 Emphysema, unspecified: Secondary | ICD-10-CM | POA: Diagnosis present

## 2023-11-04 DIAGNOSIS — K589 Irritable bowel syndrome without diarrhea: Secondary | ICD-10-CM | POA: Diagnosis present

## 2023-11-04 DIAGNOSIS — R197 Diarrhea, unspecified: Secondary | ICD-10-CM

## 2023-11-04 DIAGNOSIS — Z79899 Other long term (current) drug therapy: Secondary | ICD-10-CM

## 2023-11-04 DIAGNOSIS — M069 Rheumatoid arthritis, unspecified: Secondary | ICD-10-CM | POA: Diagnosis present

## 2023-11-04 DIAGNOSIS — T465X5A Adverse effect of other antihypertensive drugs, initial encounter: Secondary | ICD-10-CM | POA: Diagnosis present

## 2023-11-04 DIAGNOSIS — Z8719 Personal history of other diseases of the digestive system: Secondary | ICD-10-CM

## 2023-11-04 DIAGNOSIS — K222 Esophageal obstruction: Secondary | ICD-10-CM | POA: Diagnosis present

## 2023-11-04 DIAGNOSIS — Z1329 Encounter for screening for other suspected endocrine disorder: Secondary | ICD-10-CM

## 2023-11-04 LAB — CBC
HCT: 36.1 % (ref 36.0–46.0)
Hemoglobin: 11.2 g/dL — ABNORMAL LOW (ref 12.0–15.0)
MCH: 28.3 pg (ref 26.0–34.0)
MCHC: 31 g/dL (ref 30.0–36.0)
MCV: 91.2 fL (ref 80.0–100.0)
Platelets: 388 K/uL (ref 150–400)
RBC: 3.96 MIL/uL (ref 3.87–5.11)
RDW: 16 % — ABNORMAL HIGH (ref 11.5–15.5)
WBC: 18.4 K/uL — ABNORMAL HIGH (ref 4.0–10.5)
nRBC: 0 % (ref 0.0–0.2)

## 2023-11-04 LAB — COMPREHENSIVE METABOLIC PANEL WITH GFR
ALT: 11 U/L (ref 0–44)
AST: 24 U/L (ref 15–41)
Albumin: 3.6 g/dL (ref 3.5–5.0)
Alkaline Phosphatase: 97 U/L (ref 38–126)
Anion gap: 16 — ABNORMAL HIGH (ref 5–15)
BUN: 111 mg/dL — ABNORMAL HIGH (ref 8–23)
CO2: 11 mmol/L — ABNORMAL LOW (ref 22–32)
Calcium: 10.3 mg/dL (ref 8.9–10.3)
Chloride: 101 mmol/L (ref 98–111)
Creatinine, Ser: 7.89 mg/dL — ABNORMAL HIGH (ref 0.44–1.00)
GFR, Estimated: 5 mL/min — ABNORMAL LOW (ref >60)
Glucose, Bld: 102 mg/dL — ABNORMAL HIGH (ref 70–99)
Potassium: 6.9 mmol/L (ref 3.5–5.1)
Sodium: 128 mmol/L — ABNORMAL LOW (ref 135–145)
Total Bilirubin: 0.3 mg/dL (ref 0.0–1.2)
Total Protein: 7.6 g/dL (ref 6.5–8.1)

## 2023-11-04 LAB — LIPASE, BLOOD: Lipase: 58 U/L — ABNORMAL HIGH (ref 11–51)

## 2023-11-04 LAB — MAGNESIUM: Magnesium: 2.1 mg/dL (ref 1.7–2.4)

## 2023-11-04 MED ORDER — SODIUM ZIRCONIUM CYCLOSILICATE 10 G PO PACK
10.0000 g | PACK | Freq: Once | ORAL | Status: AC
  Administered 2023-11-04: 10 g via ORAL
  Filled 2023-11-04: qty 1

## 2023-11-04 MED ORDER — DEXTROSE 50 % IV SOLN
1.0000 | Freq: Once | INTRAVENOUS | Status: AC
  Administered 2023-11-04: 50 mL via INTRAVENOUS
  Filled 2023-11-04: qty 50

## 2023-11-04 MED ORDER — CALCIUM GLUCONATE-NACL 1-0.675 GM/50ML-% IV SOLN
1.0000 g | Freq: Once | INTRAVENOUS | Status: AC
  Administered 2023-11-04: 1000 mg via INTRAVENOUS
  Filled 2023-11-04: qty 50

## 2023-11-04 MED ORDER — SODIUM CHLORIDE 0.9 % IV BOLUS
1000.0000 mL | Freq: Once | INTRAVENOUS | Status: AC
  Administered 2023-11-04: 1000 mL via INTRAVENOUS

## 2023-11-04 MED ORDER — IRON (FERROUS SULFATE) 325 (65 FE) MG PO TABS
325.0000 mg | ORAL_TABLET | Freq: Every day | ORAL | Status: AC
Start: 2023-11-04 — End: ?

## 2023-11-04 MED ORDER — INSULIN ASPART 100 UNIT/ML IV SOLN
5.0000 [IU] | Freq: Once | INTRAVENOUS | Status: AC
  Administered 2023-11-04: 5 [IU] via INTRAVENOUS
  Filled 2023-11-04: qty 0.05

## 2023-11-04 NOTE — ED Triage Notes (Signed)
 Pt. Arrives via [facility] from home for diarrhea. Recently started new medication for crohn's disease. Since starting the medication, pt. Reports more flare ups. Diarrhea has lasted for about 3 weeks. Denies recent antibiotic use. Has had decreased appetite and poor fluid intake. Goven LR with EMS. 18 g in [facility]

## 2023-11-04 NOTE — ED Provider Notes (Signed)
 [place] EMERGENCY DEPARTMENT AT [facility] Provider Note   CSN: [id number] Arrival date & time: 11/04/23  2137     Patient presents with: Diarrhea   [name] is a 78 y.o. female.  {Add pertinent medical, surgical, social history, OB history to YEP:[id number]} The history is provided by the patient and medical records.  Diarrhea  78 y.o. female with history of Crohn's disease, GERD, hyperlipidemia, hypertension, presenting to the ED for ongoing diarrhea.  Patient was started on Skyrizi  for management of her Crohn's disease, this is the first time ever taking a biologic.  They switched her to this due to some strictures in the colon.  Her infusion in September went okay, she had about 3 weeks of diarrhea.  Had subsequent infusion this past Friday and has not tolerated this well at all.  All weekend she has essentially been in the bed, not really able to get up, too weak to even hold a glass, and is not eating.  Did have antibiotics for UTI earlier in the month.  She denies any fever.  EMS did give some fluids which seem to help a little bit.  Stool studies a few weeks ago were negative.  Prior to Admission medications   Medication Sig Start Date End Date Taking? Authorizing Provider  albuterol  (VENTOLIN  HFA) 108 (90 Base) MCG/ACT inhaler Inhale 2 puffs into the lungs every 6 (six) hours as needed for wheezing or shortness of breath. 05/27/22   Cobb, Comer GAILS, NP  azelastine  (ASTELIN ) 0.1 % nasal spray Place 2 sprays into both nostrils 2 (two) times daily. Use in each nostril as directed 06/03/23   Tobie Eldora NOVAK, MD  budesonide  (ENTOCORT EC ) 3 MG 24 hr capsule TAKE 3 CAPSULES (9 MG TOTAL) BY MOUTH DAILY. 08/07/23   Suzann Inocente HERO, MD  Calcium  Citrate-Vitamin D (CALCIUM  + D PO) Take 1 tablet by mouth 2 (two) times daily.    [provider]  cephALEXin  (KEFLEX ) 500 MG capsule Take 1 capsule (500 mg total) by mouth 2 (two) times daily for 7 days. 10/29/23 11/05/23   Theophilus [name] CINDERELLA, MD  cetirizine  (ZYRTEC ) 10 MG tablet Take 1 tablet (10 mg total) by mouth daily. 07/24/23   Theophilus Delma, Tully CINDERELLA, MD  dicyclomine  (BENTYL ) 20 MG tablet Take 1 tablet (20 mg total) by mouth 4 (four) times daily as needed for spasms. 11/21/22   Abran Norleen SAILOR, MD  diphenoxylate -atropine  (LOMOTIL ) 2.5-0.025 MG tablet Take 2 tablets by mouth 3 (three) times daily. TAKE 2 TABLETS BY MOUTH IN THE MORNING ,AT NOON, AND AT BEDTIME AS DIRECTED 07/29/23 07/28/24  Suzann Inocente HERO, MD  doxycycline  (VIBRAMYCIN ) 100 MG capsule Take 1 capsule (100 mg total) by mouth 2 (two) times daily. 08/19/23   Nafziger, Darleene, NP  DULoxetine  (CYMBALTA ) 60 MG capsule Take 1 capsule (60 mg total) by mouth 2 (two) times daily. 07/24/23   Theophilus Delma, Tully CINDERELLA, MD  fluticasone  (FLONASE ) 50 MCG/ACT nasal spray SPRAY 2 SPRAYS INTO EACH NOSTRIL EVERY DAY 08/11/23   Theophilus Delma, Tully CINDERELLA, MD  furosemide  (LASIX ) 20 MG tablet if needed. 04/14/23   [provider]  Iron, Ferrous Sulfate , 325 (65 Fe) MG TABS Take 325 mg by mouth at bedtime. 11/04/23   Theophilus Delma, Tully CINDERELLA, MD  losartan  (COZAAR ) 100 MG tablet Take 1 tablet (100 mg total) by mouth daily. 07/24/23   Theophilus Delma, Tully CINDERELLA, MD  Multiple Vitamin (MULTIVITAMIN WITH MINERALS) TABS tablet Take 1 tablet  by mouth daily.    [provider]  mupirocin  ointment (BACTROBAN ) 2 % Apply 1 Application topically. 04/17/22   [provider]  ofloxacin  (FLOXIN ) 0.3 % OTIC solution PLACE 4 DROPS INTO BOTH EARS 2 (TWO) TIMES DAILY FOR 5 DAYS.    [provider]  omeprazole  (PRILOSEC) 40 MG capsule Take 1 capsule (40 mg total) by mouth daily. 07/29/23   Suzann Inocente HERO, MD  ondansetron  (ZOFRAN ) 4 MG tablet Take 1 tablet (4 mg total) by mouth every 8 (eight) hours as needed for nausea or vomiting. 10/13/23   Theophilus Andrews, Tully GRADE, MD  ondansetron  (ZOFRAN -ODT) 4 MG disintegrating tablet 4mg  ODT q4 hours prn  nausea/vomit 04/17/23   Floyd, Dan, DO  SKYRIZI  360 MG/2.4ML SOCT Inject 360 mg into the skin every 8 (eight) weeks. Give approximately on 12/29 or after. 01/05/24   Suzann Inocente HERO, MD  triamcinolone  cream (KENALOG ) 0.1 % Apply topically. 09/16/19   [provider]  valACYclovir  (VALTREX ) 1000 MG tablet TAKE 1 TABLET BY MOUTH TWICE A DAY AS NEEDED 07/07/23   Theophilus Andrews, Tully GRADE, MD    Allergies: Aspirin, Latex, Other, Sulfa antibiotics, Adhesive [tape], Ciprofloxacin, Codeine, Demerol [meperidine hcl], Fentanyl , Lactose intolerance (gi), Prednisone , and Septra [sulfamethoxazole-trimethoprim]    Review of Systems  Gastrointestinal:  Positive for diarrhea.    Updated Vital Signs BP (!) 102/55 (BP Location: Left Arm)   Pulse 80   Temp 97.7 F (36.5 C) (Oral)   Resp 16   SpO2 93%   Physical Exam Vitals and nursing note reviewed.  Constitutional:      Appearance: She is well-developed.  HENT:     Head: Normocephalic and atraumatic.     Mouth/Throat:     Comments: Appears very dry, lips dry/cracked Eyes:     Conjunctiva/sclera: Conjunctivae normal.     Pupils: Pupils are equal, round, and reactive to light.  Cardiovascular:     Rate and Rhythm: Normal rate and regular rhythm.     Heart sounds: Normal heart sounds.  Pulmonary:     Effort: Pulmonary effort is normal.     Breath sounds: Normal breath sounds.  Abdominal:     General: Bowel sounds are normal.     Palpations: Abdomen is soft.     Tenderness: There is abdominal tenderness.     Comments: Mild tenderness lower abdomen without peritoneal signs  Musculoskeletal:        General: Normal range of motion.     Cervical back: Normal range of motion.  Skin:    General: Skin is warm and dry.  Neurological:     Mental Status: She is alert and oriented to person, place, and time.     (all labs ordered are listed, but only abnormal results are displayed) Labs Reviewed  LIPASE, BLOOD - Abnormal; Notable for the  following components:      Result Value   Lipase 58 (*)    All other components within normal limits  COMPREHENSIVE METABOLIC PANEL WITH GFR - Abnormal; Notable for the following components:   Sodium 128 (*)    Potassium 6.9 (*)    CO2 11 (*)    Glucose, Bld 102 (*)    BUN 111 (*)    Creatinine, Ser 7.89 (*)    GFR, Estimated 5 (*)    Anion gap 16 (*)    All other components within normal limits  CBC - Abnormal; Notable for the following components:   WBC 18.4 (*)  Hemoglobin 11.2 (*)    RDW 16.0 (*)    All other components within normal limits  GASTROINTESTINAL PANEL BY PCR, STOOL (REPLACES STOOL CULTURE)  C DIFFICILE QUICK SCREEN W PCR REFLEX    MAGNESIUM   URINALYSIS, ROUTINE W REFLEX MICROSCOPIC    EKG: None  Radiology: No results found.  {Document cardiac monitor, telemetry assessment procedure when appropriate:32947} Procedures   CRITICAL CARE Performed by: Olam CHRISTELLA Slocumb   Total critical care time: 45 minutes  Critical care time was exclusive of separately billable procedures and treating other patients.  Critical care was necessary to treat or prevent imminent or life-threatening deterioration.  Critical care was time spent personally by me on the following activities: development of treatment plan with patient and/or surrogate as well as nursing, discussions with consultants, evaluation of patient's response to treatment, examination of patient, obtaining history from patient or surrogate, ordering and performing treatments and interventions, ordering and review of laboratory studies, ordering and review of radiographic studies, pulse oximetry and re-evaluation of patient's condition.   Medications Ordered in the ED  sodium chloride  0.9 % bolus 1,000 mL (has no administration in time range)      {Click here for ABCD2, HEART and other calculators REFRESH Note before signing:1}                              Medical Decision Making Amount and/or Complexity  of Data Reviewed Labs: ordered. Radiology: ordered. ECG/medicine tests: ordered.  Risk OTC drugs. Prescription drug management.   ***  {Document critical care time when appropriate  Document review of labs and clinical decision tools ie CHADS2VASC2, etc  Document your independent review of radiology images and any outside records  Document your discussion with family members, caretakers and with consultants  Document social determinants of health affecting pt's care  Document your decision making why or why not admission, treatments were needed:32947:::1}   Final diagnoses:  None    ED Discharge Orders     None

## 2023-11-05 DIAGNOSIS — K222 Esophageal obstruction: Secondary | ICD-10-CM | POA: Diagnosis present

## 2023-11-05 DIAGNOSIS — K862 Cyst of pancreas: Secondary | ICD-10-CM | POA: Diagnosis present

## 2023-11-05 DIAGNOSIS — E875 Hyperkalemia: Secondary | ICD-10-CM | POA: Diagnosis not present

## 2023-11-05 DIAGNOSIS — I959 Hypotension, unspecified: Secondary | ICD-10-CM | POA: Diagnosis not present

## 2023-11-05 DIAGNOSIS — I129 Hypertensive chronic kidney disease with stage 1 through stage 4 chronic kidney disease, or unspecified chronic kidney disease: Secondary | ICD-10-CM | POA: Diagnosis present

## 2023-11-05 DIAGNOSIS — I10 Essential (primary) hypertension: Secondary | ICD-10-CM | POA: Insufficient documentation

## 2023-11-05 DIAGNOSIS — M069 Rheumatoid arthritis, unspecified: Secondary | ICD-10-CM | POA: Diagnosis present

## 2023-11-05 DIAGNOSIS — N189 Chronic kidney disease, unspecified: Secondary | ICD-10-CM | POA: Diagnosis present

## 2023-11-05 DIAGNOSIS — I2510 Atherosclerotic heart disease of native coronary artery without angina pectoris: Secondary | ICD-10-CM | POA: Diagnosis present

## 2023-11-05 DIAGNOSIS — N1832 Chronic kidney disease, stage 3b: Secondary | ICD-10-CM | POA: Diagnosis present

## 2023-11-05 DIAGNOSIS — N170 Acute kidney failure with tubular necrosis: Secondary | ICD-10-CM | POA: Diagnosis present

## 2023-11-05 DIAGNOSIS — E876 Hypokalemia: Secondary | ICD-10-CM | POA: Diagnosis present

## 2023-11-05 DIAGNOSIS — E8721 Acute metabolic acidosis: Secondary | ICD-10-CM | POA: Diagnosis present

## 2023-11-05 DIAGNOSIS — D509 Iron deficiency anemia, unspecified: Secondary | ICD-10-CM | POA: Diagnosis present

## 2023-11-05 DIAGNOSIS — G4700 Insomnia, unspecified: Secondary | ICD-10-CM | POA: Diagnosis present

## 2023-11-05 DIAGNOSIS — K50112 Crohn's disease of large intestine with intestinal obstruction: Secondary | ICD-10-CM | POA: Diagnosis present

## 2023-11-05 DIAGNOSIS — E860 Dehydration: Secondary | ICD-10-CM | POA: Diagnosis present

## 2023-11-05 DIAGNOSIS — F411 Generalized anxiety disorder: Secondary | ICD-10-CM

## 2023-11-05 DIAGNOSIS — E871 Hypo-osmolality and hyponatremia: Secondary | ICD-10-CM | POA: Diagnosis present

## 2023-11-05 DIAGNOSIS — K580 Irritable bowel syndrome with diarrhea: Secondary | ICD-10-CM | POA: Diagnosis not present

## 2023-11-05 DIAGNOSIS — R197 Diarrhea, unspecified: Secondary | ICD-10-CM | POA: Diagnosis not present

## 2023-11-05 DIAGNOSIS — R531 Weakness: Secondary | ICD-10-CM | POA: Diagnosis not present

## 2023-11-05 DIAGNOSIS — K5010 Crohn's disease of large intestine without complications: Secondary | ICD-10-CM | POA: Diagnosis not present

## 2023-11-05 DIAGNOSIS — J439 Emphysema, unspecified: Secondary | ICD-10-CM | POA: Diagnosis present

## 2023-11-05 DIAGNOSIS — Z743 Need for continuous supervision: Secondary | ICD-10-CM | POA: Diagnosis not present

## 2023-11-05 DIAGNOSIS — N179 Acute kidney failure, unspecified: Secondary | ICD-10-CM | POA: Diagnosis not present

## 2023-11-05 DIAGNOSIS — Z8719 Personal history of other diseases of the digestive system: Secondary | ICD-10-CM

## 2023-11-05 DIAGNOSIS — K909 Intestinal malabsorption, unspecified: Secondary | ICD-10-CM | POA: Diagnosis present

## 2023-11-05 DIAGNOSIS — R112 Nausea with vomiting, unspecified: Secondary | ICD-10-CM | POA: Diagnosis not present

## 2023-11-05 DIAGNOSIS — A040 Enteropathogenic Escherichia coli infection: Secondary | ICD-10-CM | POA: Diagnosis present

## 2023-11-05 DIAGNOSIS — Z79899 Other long term (current) drug therapy: Secondary | ICD-10-CM | POA: Diagnosis not present

## 2023-11-05 DIAGNOSIS — Z8739 Personal history of other diseases of the musculoskeletal system and connective tissue: Secondary | ICD-10-CM

## 2023-11-05 DIAGNOSIS — K50919 Crohn's disease, unspecified, with unspecified complications: Secondary | ICD-10-CM | POA: Diagnosis not present

## 2023-11-05 DIAGNOSIS — K529 Noninfective gastroenteritis and colitis, unspecified: Secondary | ICD-10-CM | POA: Diagnosis present

## 2023-11-05 DIAGNOSIS — Z886 Allergy status to analgesic agent status: Secondary | ICD-10-CM | POA: Diagnosis not present

## 2023-11-05 DIAGNOSIS — Z87891 Personal history of nicotine dependence: Secondary | ICD-10-CM | POA: Diagnosis not present

## 2023-11-05 LAB — URINALYSIS, ROUTINE W REFLEX MICROSCOPIC
Bacteria, UA: NONE SEEN
Bilirubin Urine: NEGATIVE
Glucose, UA: NEGATIVE mg/dL
Hgb urine dipstick: NEGATIVE
Ketones, ur: NEGATIVE mg/dL
Nitrite: NEGATIVE
Protein, ur: NEGATIVE mg/dL
Specific Gravity, Urine: 1.015 (ref 1.005–1.030)
pH: 5 (ref 5.0–8.0)

## 2023-11-05 LAB — BASIC METABOLIC PANEL WITH GFR
Anion gap: 14 (ref 5–15)
BUN: 112 mg/dL — ABNORMAL HIGH (ref 8–23)
CO2: 12 mmol/L — ABNORMAL LOW (ref 22–32)
Calcium: 10 mg/dL (ref 8.9–10.3)
Chloride: 104 mmol/L (ref 98–111)
Creatinine, Ser: 7.69 mg/dL — ABNORMAL HIGH (ref 0.44–1.00)
GFR, Estimated: 5 mL/min — ABNORMAL LOW (ref >60)
Glucose, Bld: 99 mg/dL (ref 70–99)
Potassium: 5.6 mmol/L — ABNORMAL HIGH (ref 3.5–5.1)
Sodium: 130 mmol/L — ABNORMAL LOW (ref 135–145)

## 2023-11-05 LAB — COMPREHENSIVE METABOLIC PANEL WITH GFR
ALT: 10 U/L (ref 0–44)
AST: 13 U/L — ABNORMAL LOW (ref 15–41)
Albumin: 3.6 g/dL (ref 3.5–5.0)
Alkaline Phosphatase: 93 U/L (ref 38–126)
Anion gap: 16 — ABNORMAL HIGH (ref 5–15)
BUN: 111 mg/dL — ABNORMAL HIGH (ref 8–23)
CO2: 14 mmol/L — ABNORMAL LOW (ref 22–32)
Calcium: 11.9 mg/dL — ABNORMAL HIGH (ref 8.9–10.3)
Chloride: 100 mmol/L (ref 98–111)
Creatinine, Ser: 7.67 mg/dL — ABNORMAL HIGH (ref 0.44–1.00)
GFR, Estimated: 5 mL/min — ABNORMAL LOW (ref >60)
Glucose, Bld: 86 mg/dL (ref 70–99)
Potassium: 5.2 mmol/L — ABNORMAL HIGH (ref 3.5–5.1)
Sodium: 129 mmol/L — ABNORMAL LOW (ref 135–145)
Total Bilirubin: 0.3 mg/dL (ref 0.0–1.2)
Total Protein: 7.3 g/dL (ref 6.5–8.1)

## 2023-11-05 LAB — CBC
HCT: 34.6 % — ABNORMAL LOW (ref 36.0–46.0)
Hemoglobin: 10.7 g/dL — ABNORMAL LOW (ref 12.0–15.0)
MCH: 27.9 pg (ref 26.0–34.0)
MCHC: 30.9 g/dL (ref 30.0–36.0)
MCV: 90.1 fL (ref 80.0–100.0)
Platelets: 341 K/uL (ref 150–400)
RBC: 3.84 MIL/uL — ABNORMAL LOW (ref 3.87–5.11)
RDW: 15.9 % — ABNORMAL HIGH (ref 11.5–15.5)
WBC: 17.4 K/uL — ABNORMAL HIGH (ref 4.0–10.5)
nRBC: 0 % (ref 0.0–0.2)

## 2023-11-05 LAB — CBG MONITORING, ED: Glucose-Capillary: 154 mg/dL — ABNORMAL HIGH (ref 70–99)

## 2023-11-05 LAB — SODIUM, URINE, RANDOM: Sodium, Ur: 48 mmol/L

## 2023-11-05 LAB — OSMOLALITY, URINE: Osmolality, Ur: 397 mosm/kg (ref 300–900)

## 2023-11-05 LAB — CREATININE, URINE, RANDOM: Creatinine, Urine: 141 mg/dL

## 2023-11-05 LAB — OSMOLALITY: Osmolality: 319 mosm/kg — ABNORMAL HIGH (ref 275–295)

## 2023-11-05 MED ORDER — SODIUM CHLORIDE 0.9 % IV BOLUS
2500.0000 mL | Freq: Once | INTRAVENOUS | Status: AC
  Administered 2023-11-05: 2500 mL via INTRAVENOUS

## 2023-11-05 MED ORDER — LACTATED RINGERS IV SOLN: INTRAVENOUS | Status: DC

## 2023-11-05 MED ORDER — ONDANSETRON HCL 4 MG PO TABS: 4.0000 mg | ORAL_TABLET | Freq: Four times a day (QID) | ORAL | Status: DC | PRN

## 2023-11-05 MED ORDER — ALBUTEROL SULFATE (2.5 MG/3ML) 0.083% IN NEBU: 3.0000 mL | INHALATION_SOLUTION | Freq: Four times a day (QID) | RESPIRATORY_TRACT | Status: DC | PRN

## 2023-11-05 MED ORDER — SODIUM CHLORIDE 0.9% FLUSH
3.0000 mL | Freq: Two times a day (BID) | INTRAVENOUS | Status: DC
  Administered 2023-11-05 – 2023-11-10 (×3): 3 mL via INTRAVENOUS

## 2023-11-05 MED ORDER — SODIUM CHLORIDE 0.9 % IV SOLN
2.0000 g | INTRAVENOUS | Status: AC
  Administered 2023-11-05 – 2023-11-10 (×5): 2 g via INTRAVENOUS
  Filled 2023-11-05 (×6): qty 20

## 2023-11-05 MED ORDER — DULOXETINE HCL 30 MG PO CPEP
60.0000 mg | ORAL_CAPSULE | Freq: Two times a day (BID) | ORAL | Status: DC
Start: 2023-11-05 — End: 2023-11-05

## 2023-11-05 MED ORDER — METRONIDAZOLE 500 MG/100ML IV SOLN
500.0000 mg | Freq: Two times a day (BID) | INTRAVENOUS | Status: DC
  Administered 2023-11-05 – 2023-11-08 (×6): 500 mg via INTRAVENOUS
  Filled 2023-11-05 (×6): qty 100

## 2023-11-05 MED ORDER — STERILE WATER FOR INJECTION IV SOLN
INTRAMUSCULAR | Status: DC
  Filled 2023-11-05: qty 150

## 2023-11-05 MED ORDER — HEPARIN SODIUM (PORCINE) 5000 UNIT/ML IJ SOLN
5000.0000 [IU] | Freq: Three times a day (TID) | INTRAMUSCULAR | Status: DC
  Administered 2023-11-05 – 2023-11-12 (×22): 5000 [IU] via SUBCUTANEOUS
  Filled 2023-11-05 (×23): qty 1

## 2023-11-05 MED ORDER — CEFTRIAXONE SODIUM 1 G IJ SOLR
1.0000 g | Freq: Once | INTRAMUSCULAR | Status: AC
  Administered 2023-11-05: 1 g via INTRAVENOUS
  Filled 2023-11-05: qty 10

## 2023-11-05 MED ORDER — SODIUM BICARBONATE 8.4 % IV SOLN
INTRAVENOUS | Status: DC
  Filled 2023-11-05: qty 1000

## 2023-11-05 MED ORDER — ACETAMINOPHEN 650 MG RE SUPP: 650.0000 mg | Freq: Four times a day (QID) | RECTAL | Status: DC | PRN

## 2023-11-05 MED ORDER — STERILE WATER FOR INJECTION IV SOLN
INTRAVENOUS | Status: AC
  Filled 2023-11-05 (×2): qty 1000

## 2023-11-05 MED ORDER — DICYCLOMINE HCL 20 MG PO TABS: 20.0000 mg | ORAL_TABLET | Freq: Four times a day (QID) | ORAL | Status: DC | PRN

## 2023-11-05 MED ORDER — SODIUM CHLORIDE 0.9 % IV SOLN: 250.0000 mL | INTRAVENOUS | Status: AC | PRN

## 2023-11-05 MED ORDER — DIPHENOXYLATE-ATROPINE 2.5-0.025 MG PO TABS
2.0000 | ORAL_TABLET | Freq: Three times a day (TID) | ORAL | Status: DC
  Administered 2023-11-05 – 2023-11-11 (×17): 2 via ORAL
  Filled 2023-11-05 (×18): qty 2

## 2023-11-05 MED ORDER — ACETAMINOPHEN 325 MG PO TABS
650.0000 mg | ORAL_TABLET | Freq: Four times a day (QID) | ORAL | Status: DC | PRN
  Administered 2023-11-07 – 2023-11-10 (×3): 650 mg via ORAL
  Filled 2023-11-05 (×3): qty 2

## 2023-11-05 MED ORDER — BISMUTH SUBSALICYLATE 262 MG/15ML PO SUSP: 30.0000 mL | ORAL | Status: DC | PRN

## 2023-11-05 MED ORDER — SODIUM CHLORIDE 0.9% FLUSH
3.0000 mL | Freq: Two times a day (BID) | INTRAVENOUS | Status: DC
  Administered 2023-11-05 – 2023-11-10 (×9): 3 mL via INTRAVENOUS

## 2023-11-05 MED ORDER — METRONIDAZOLE 500 MG/100ML IV SOLN
500.0000 mg | Freq: Once | INTRAVENOUS | Status: AC
  Administered 2023-11-05: 500 mg via INTRAVENOUS
  Filled 2023-11-05: qty 100

## 2023-11-05 MED ORDER — PANTOPRAZOLE SODIUM 40 MG PO TBEC
40.0000 mg | DELAYED_RELEASE_TABLET | Freq: Every day | ORAL | Status: DC
  Administered 2023-11-05 – 2023-11-12 (×8): 40 mg via ORAL
  Filled 2023-11-05 (×8): qty 1

## 2023-11-05 MED ORDER — ONDANSETRON HCL 4 MG/2ML IJ SOLN
4.0000 mg | Freq: Four times a day (QID) | INTRAMUSCULAR | Status: DC | PRN
  Administered 2023-11-06 – 2023-11-09 (×5): 4 mg via INTRAVENOUS
  Filled 2023-11-05 (×5): qty 2

## 2023-11-05 MED ORDER — CALCIUM GLUCONATE-NACL 1-0.675 GM/50ML-% IV SOLN
1.0000 g | INTRAVENOUS | Status: AC
  Administered 2023-11-05: 1000 mg via INTRAVENOUS
  Filled 2023-11-05: qty 50

## 2023-11-05 MED ORDER — FERROUS SULFATE 325 (65 FE) MG PO TABS
325.0000 mg | ORAL_TABLET | Freq: Every day | ORAL | Status: DC
  Administered 2023-11-05 – 2023-11-11 (×8): 325 mg via ORAL
  Filled 2023-11-05 (×9): qty 1

## 2023-11-05 MED ORDER — SODIUM CHLORIDE 0.9% FLUSH: 3.0000 mL | INTRAVENOUS | Status: DC | PRN

## 2023-11-05 NOTE — ED Notes (Signed)
 Writer went to update pt and family about plan to transfer to [facility]. They were not aware pt was being transferred. Both wanted to stay here. Dr Lethaniel notified of request, response, Nephrology requested [facility] in case pt needs HD. They also were not aware of the possibility of pt needing Dialysis. Writer was explaining when Dr Charlanne with GI come in and discussed the GI issues and concerns they have about Skyrizi  and do not want her to receive it anymore. They were very direct in the feelings they have regarding the transfer and possible care.

## 2023-11-05 NOTE — ED Notes (Signed)
 Clarifying: Lab has one blood culture presently in lab.  Still Need: Urine and Stool to be collected and sent.

## 2023-11-05 NOTE — Consult Note (Addendum)
 Renal Service Consult Note [facility] April JONETTA Fret, MD  Patient: [name] Date: 11/05/2023 Requesting Physician: Dr. Cheryle  Reason for Consult: Renal failure HPI: The patient is a 78 y.o. year-old w/ PMH of anemia, Crohn's, GERD, HTN, RA, CKD 3b, OA, pancreatic insufficiency who presented 10/28 to ED c/o diarrhea. Recently started a new medication for Crohn's disease. Has had more flare-ups on the medication. Diarrhea for about 3 wks. No recent abx. Poor appetite. In ED BP 102/55, hR 80, RR 16-20, temp 97.7. RA 93% sats. Labs showed Na 128, K+ 6.9, CO2 11, BUN 111, creat 7.89, Ca 10.3, AG 16, Mg 2.1.  Alb 3.6. LFTs ok. WBC 18K, Hb 11. UA done 10/22 (1 wk ago) showed trace LE, +protein, otherwise negative. CT abd showed active colitis of the transverse and descending colon. Pt rec'd hyperkalemia temporizing measures, also IV rocephin and flagyl . Pt was admitted. We are asked to see for renal failure.    Pt seen in ED room, husband give the history, pt is tired and confused. Husband says that they started a new biological for her Crohn's and it caused long-term diarrhea (3 wks) after the 1st treatment, then she was better for 1 wk, then had 2nd treatment last Friday, then has had diarrhea daily since then. She has become very tired, and even confused, no seizure-like activity. No n/v.  No abd pain.    ROS - denies CP, no joint pain, no HA, no blurry vision, no rash   Past Medical History  Past Medical History:  Diagnosis Date   Allergies    Anemia    Bruises easily    Complication of anesthesia    Crohn's disease (HCC)    GI Dr Miquel in [place], [place]     Deviated septum    GERD (gastroesophageal reflux disease)    History of hiatal hernia    HTN (hypertension)    pcp Dr Evalene Polo  in [place], [place]     Kidney disease, chronic, stage III (GFR 30-59 ml/min) (HCC)    Neuropathy    fingers,periodically   Osteoarthritis    Pancreatic  insufficiency    PONV (postoperative nausea and vomiting)    Scoliosis    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   Whooping cough 2015   Whooping cough with pneumonia    2017   Past Surgical History  Past Surgical History:  Procedure Laterality Date   BIOPSY  11/05/2022   Procedure: BIOPSY;  Surgeon: Albertus Gordy HERO, MD;  Location: [facility] ENDOSCOPY;  Service: Gastroenterology;;   CATARACT EXTRACTION, BILATERAL  2018   with IOC implant    COLONOSCOPY     DEBRIDEMENT AND CLOSURE WOUND Right 10/01/2018   Procedure: Excision of right knee wound with placement of ACell and Pravena;  Surgeon: Lowery Estefana RAMAN, DO;  Location:  SURGERY CENTER;  Service: Plastics;  Laterality: Right;  30 min   ESOPHAGOGASTRODUODENOSCOPY (EGD) WITH PROPOFOL  N/A 11/05/2022   Procedure: ESOPHAGOGASTRODUODENOSCOPY (EGD) WITH PROPOFOL ;  Surgeon: Albertus Gordy HERO, MD;  Location: [facility] ENDOSCOPY;  Service: Gastroenterology;  Laterality: N/A;   HERNIA REPAIR     knee fracture  Right    NASAL FRACTURE SURGERY     needed b/c she was getting frequent sinus infections    TONSILLECTOMY     TOTAL KNEE ARTHROPLASTY Right 07/28/2018   Procedure: TOTAL KNEE ARTHROPLASTY;  Surgeon: Ernie Cough, MD;  Location: [facility] ORS;  Service: Orthopedics;  Laterality: Right;  TOTAL KNEE ARTHROPLASTY Left 03/02/2019   Procedure: TOTAL KNEE ARTHROPLASTY;  Surgeon: Ernie Cough, MD;  Location: [facility] ORS;  Service: Orthopedics;  Laterality: Left;  70 mins   UPPER GASTROINTESTINAL ENDOSCOPY     XI ROBOTIC ASSISTED HIATAL HERNIA REPAIR N/A 01/31/2023   Procedure: XI ROBOTIC ASSISTED HIATAL HERNIA REPAIR WITH MESH;  Surgeon: Stechschulte, Deward PARAS, MD;  Location: [facility] ORS;  Service: General;  Laterality: N/A;   Family History  Family History  Problem Relation Age of Onset   Diabetes Father    Parkinson's disease Father    Colon cancer Other    Asthma Neg Hx    Esophageal cancer Neg Hx    Pancreatic cancer Neg Hx    Liver disease Neg Hx     Stomach cancer Neg Hx    Social History  reports that she quit smoking about 35 years ago. Her smoking use included cigarettes. She started smoking about 65 years ago. She has a 30 pack-year smoking history. She has never used smokeless tobacco. She reports that she does not currently use alcohol. She reports that she does not use drugs. Allergies  Allergies  Allergen Reactions   Aspirin Other (See Comments)    Intestinal Bleeding   Latex Other (See Comments)    Redness and skin peeling   Other Itching    Nuts-itching in throat  Seeds-stomach issues with chrons  Pt has emphysema    Skyrizi  [Risankizumab ]     Excessive Diarrhea   Sulfa Antibiotics Nausea And Vomiting, Nausea Only, Other (See Comments) and Swelling   Adhesive [Tape] Other (See Comments)    Redness and skin peeling   Ciprofloxacin Nausea And Vomiting   Codeine Nausea And Vomiting   Demerol [Meperidine Hcl] Nausea And Vomiting   Fentanyl  Nausea And Vomiting    Makes me very sick   Lactose Intolerance (Gi) Other (See Comments)    Pt has Crohn's    Prednisone  Other (See Comments)    Upset stomach   Septra [Sulfamethoxazole-Trimethoprim] Nausea Only   Home medications Prior to Admission medications   Medication Sig Start Date End Date Taking? Authorizing Provider  azelastine  (ASTELIN ) 0.1 % nasal spray Place 2 sprays into both nostrils 2 (two) times daily. Use in each nostril as directed 06/03/23  Yes Tobie Comp B, MD  budesonide  (ENTOCORT EC ) 3 MG 24 hr capsule TAKE 3 CAPSULES (9 MG TOTAL) BY MOUTH DAILY. 08/07/23  Yes McGreal, Inocente HERO, MD  Calcium  Citrate-Vitamin D (CALCIUM  + D PO) Take 1 tablet by mouth 2 (two) times daily.   Yes [provider]  cetirizine  (ZYRTEC ) 10 MG tablet Take 1 tablet (10 mg total) by mouth daily. 07/24/23  Yes Theophilus Andrews, Tully GRADE, MD  dicyclomine  (BENTYL ) 20 MG tablet Take 1 tablet (20 mg total) by mouth 4 (four) times daily as needed for spasms. 11/21/22  Yes Abran Norleen SAILOR, MD  diphenoxylate -atropine  (LOMOTIL ) 2.5-0.025 MG tablet Take 2 tablets by mouth 3 (three) times daily. TAKE 2 TABLETS BY MOUTH IN THE MORNING ,AT NOON, AND AT BEDTIME AS DIRECTED 07/29/23 07/28/24 Yes McGreal, Inocente HERO, MD  DULoxetine  (CYMBALTA ) 60 MG capsule Take 1 capsule (60 mg total) by mouth 2 (two) times daily. 07/24/23  Yes Theophilus Andrews, Tully GRADE, MD  fluticasone  (FLONASE ) 50 MCG/ACT nasal spray SPRAY 2 SPRAYS INTO EACH NOSTRIL EVERY DAY 08/11/23  Yes Theophilus Andrews, Tully GRADE, MD  losartan  (COZAAR ) 100 MG tablet Take 1 tablet (100 mg total) by mouth daily. 07/24/23  Yes Theophilus Andrews, Tully GRADE, MD  meclizine  (ANTIVERT ) 25 MG tablet Take 25 mg by mouth 3 (three) times daily as needed for dizziness.   Yes [provider]  Multiple Vitamin (MULTIVITAMIN WITH MINERALS) TABS tablet Take 1 tablet by mouth daily.   Yes [provider]  omeprazole  (PRILOSEC) 40 MG capsule Take 1 capsule (40 mg total) by mouth daily. 07/29/23  Yes McGreal, Inocente HERO, MD  ondansetron  (ZOFRAN ) 4 MG tablet Take 1 tablet (4 mg total) by mouth every 8 (eight) hours as needed for nausea or vomiting. 10/13/23  Yes Theophilus Andrews, Tully GRADE, MD  albuterol  (VENTOLIN  HFA) [phone] Base) MCG/ACT inhaler Inhale 2 puffs into the lungs every 6 (six) hours as needed for wheezing or shortness of breath. 05/27/22   Cobb, Comer GAILS, NP  cephALEXin  (KEFLEX ) 500 MG capsule Take 1 capsule (500 mg total) by mouth 2 (two) times daily for 7 days. Patient not taking: Reported on 11/05/2023 10/29/23 11/05/23  Theophilus Andrews, Tully GRADE, MD  doxycycline  (VIBRAMYCIN ) 100 MG capsule Take 1 capsule (100 mg total) by mouth 2 (two) times daily. 08/19/23   Nafziger, Darleene, NP  Iron, Ferrous Sulfate , 325 (65 Fe) MG TABS Take 325 mg by mouth at bedtime. Patient not taking: Reported on 11/05/2023 11/04/23   Theophilus Andrews, Tully GRADE, MD  ofloxacin  (FLOXIN ) 0.3 % OTIC solution PLACE 4 DROPS INTO BOTH EARS 2 (TWO) TIMES DAILY FOR 5 DAYS.     [provider]  ondansetron  (ZOFRAN -ODT) 4 MG disintegrating tablet 4mg  ODT q4 hours prn nausea/vomit 04/17/23   Floyd, Dan, DO  SKYRIZI  360 MG/2.4ML SOCT Inject 360 mg into the skin every 8 (eight) weeks. Give approximately on 12/29 or after. Patient not taking: Reported on 11/05/2023 01/05/24   Suzann Inocente HERO, MD     Vitals:   11/05/23 1045 11/05/23 1100 11/05/23 1115 11/05/23 1334  BP:  (!) 146/63 134/69 139/65  Pulse: 79  70 74  Resp: 19 (!) 21 15 18   Temp:   98.4 F (36.9 C) 98.3 F (36.8 C)  TempSrc:   Oral Oral  SpO2: 99%  95% 97%   Exam Gen elderly lady, pleasant, a bit confused Responsive Sclera anicteric, throat clear and mod dry  No jvd or bruits, neck veins flat Chest clear bilat to bases RRR no MRG Abd soft ntnd no mass or ascites +bs Ext diffuse skin condition, no LE or UE edema, no other edema Neuro is alert, awake, nonfocal, gen'd weakness   Home bp meds: Losartan  100 mg every day  Date   Creat  eGFR (ml/min) 2020- 2021  0.82- 1.27 2022   1.07- 1.25 2023   1.00- 1.07 Jan- April 2024 1.05- 1.14 Sept -oct 2024  1.01- 1.04 Feb 2023  0.88- 1.29 04/16/23   3.10 04/28/23  2.15 Aug 2023  1.41  38 ml/min 10/23   1.25  41 ml/min 10/28   7.69 10/29   7.67   BP: initial ED bp was 102/55, came up after IVFs NS 1 L bolus in ED, and LR at 125 cc/hr started  UA - pending Abd CT noncon -> KIDNEYS, URETERS AND BLADDER: No stones in the kidneys or ureters. No hydronephrosis. No perinephric or periureteral stranding. Urinary bladder is unremarkable. Kidneys are normal in appearance.  Labs today -> Na 129, K+ 5.2, CO2 14, BUN 111, creat 7.67   Assessment/ Plan: AKI on CKD 3b: b/l creat 1.2- 1.4 from aug-oct 2025, eGFR 38- 41 ml/min. Creat here  7.6 on admit, in the setting of several days of diarrheal illness (hx of Crohns). CT showed colitis desc/ transverse colon. Pt is on ARB. Abd CT showed no obstruction. Pt is confused, but no other indication for  RRT. Pt looks dry on exam. Suspect AKI is due to sig vol depletion + ARB effects +/- hypotension (on arrival). Could have ATN. Has not gotten much IVF yet. Will plan to place foley, get UA, urine lytes, and bolus 2.5 L NS.  Continue bicarb gtt otherwise at 125 cc/hr.  Metabolic acidosis: due to diarrhea and AKI, cont bicarb at 125 cc/hr.  Hyperkalemia: K+ 6.9 yesterday and today is down to 5.2. Changed diet to renal diet for now.  Crohns' disease HTN: use anything other than ARB/ ACEi/ Diuretics       April Fret  MD [facility] 11/05/2023, 2:04 PM  Recent Labs  Lab 11/05/23 0138 11/05/23 0501  CREATININE 7.69* 7.67*  K 5.6* 5.2*   Inpatient medications:  diphenoxylate -atropine   2 tablet Oral TID   ferrous sulfate   325 mg Oral QHS   heparin   5,000 Units Subcutaneous Q8H   pantoprazole   40 mg Oral Daily   sodium chloride  flush  3 mL Intravenous Q12H   sodium chloride  flush  3 mL Intravenous Q12H    sodium chloride      cefTRIAXone (ROCEPHIN)  IV     metronidazole  500 mg (11/05/23 1333)   sodium bicarbonate 150 mEq in sterile water  1,150 mL infusion Stopped (11/05/23 1245)   sodium chloride , acetaminophen  **OR** acetaminophen , albuterol , bismuth subsalicylate, dicyclomine , ondansetron  **OR** ondansetron  (ZOFRAN ) IV, sodium chloride  flush

## 2023-11-05 NOTE — Consult Note (Addendum)
 Consultation  Referring Provider:  ED Primary Care Physician:  Theophilus Andrews, Tully GRADE, MD Primary Gastroenterologist:  Dr. Suzann       Reason for Consultation:  acute on chronic diarrhea     LOS: 0 days          HPI:   [name] is a 78 y.o. female with past medical history significant for CKD 3, hypertension, neuropathy, SCC of RLE, RA, scoliosis, Crohn's colitis diagnosed 1997, large paraesophageal hiatal hernia s/p robotic hiatal hernia surgery 01/2023, pancreatic cysts, IDA, presents for evaluation of acute on chronic diarrhea.  Patient has a history of longstanding Crohn's disease and has previously tried mesalamine , budesonide  without benefit.  She was put on metronidazole  and Augmentin  for presumable SIBO.  She was given Colestid  but the pills were too big to swallow.  She has tried pancreatic enzymes with no benefit.  She uses Lomotil  and Imodium .    She presents to the ED for acute on chronic diarrhea.  She was recently put on Skyrizi  and has completed 2 doses with the last dose being 1 week ago.  She states her acute on chronic diarrhea began with the first dose of her Skyrizi  and she attributes her worsening diarrhea to Skyrizi  and wishes not to continue.  She has generalized abdominal pain that occurs with a bowel movement and improves after bowel movement.  Denies rectal bleeding.  Of note she recently was given macrobid for UTI and then given keflex  but she states she did not complete this antibiotic.  Upon arrival to ED, CT abdomen pelvis without contrast showed active colitis involving transverse and descending colon with mild circumferential wall thickening, pericolonic inflammatory stranding, and vascular engorgement.  Labs notable for leukocytosis 17.4, Hgb 10.7, potassium 5.2 (improving), creatinine 7.67, BUN 111  PREVIOUS GI WORKUP   -- Celiac panel negative -- Alpha gal panel negative -- Fecal fat and pancreatic elastase normal -- Fecal  calprotectin 266 -- SIBO breath test + 08/2023 -- 7 alpha C4 laboratory test normal   EGD and colonoscopy completed 07/2023 EGD - Tortuous esophagus. Dilation performed in the entire esophagus. No evidence of residual paraesophageal hernia.  - LA Grade A esophagitis with no bleeding.  - Normal gastric body, antrum, cardia and gastric fundus. Biopsied.  - A small amount of retained fibrous/vegetable material was present in the stomach. No documented history of gastroparesis.  - Normal duodenal bulb and second portion of the duodenum. Biopsied.  - Tortuosity of esophagus and/or esophagitis could be contributing to patient's reported dysphagia. An esophageal motility disorder remains in the differential diagnosis.   Colonoscopy - Decreased sphincter tone found on digital rectal exam.  - The colonic mucosa appeared diffusely atrophic and scarred but without significantly active inflammation endoscopically. Biopsied. Rule out active IBD, microscopic colitis and dysplasia.  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 Biopsied -rule out dysplasia. - The examined portion of the ileum was normal. Biopsied. - One 5 mm polyp in the rectum, removed with a cold biopsy forceps. Resected and retrieved.  - The distal rectum and anal verge are normal on retroflexion view   Pathology negative for H. pylori and celiac disease Right colon biopsies showed chronic active colitis; chronic inactive colitis on left colon biopsies Polyps were hyperplastic polyps  Past Medical History:  Diagnosis Date   Allergies    Anemia    Bruises easily    Complication of anesthesia    Crohn's disease (HCC)    GI  Dr Miquel in [place], [place]     Deviated septum    GERD (gastroesophageal reflux disease)    History of hiatal hernia    HTN (hypertension)    pcp Dr Evalene Polo  in [place], [place]     Kidney disease, chronic, stage III (GFR 30-59 ml/min) (HCC)    Neuropathy    fingers,periodically   Osteoarthritis     Pancreatic insufficiency    PONV (postoperative nausea and vomiting)    Scoliosis    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   Whooping cough 2015   Whooping cough with pneumonia    2017    Surgical History:  She  has a past surgical history that includes Colonoscopy; Cataract extraction, bilateral (2018); Upper gastrointestinal endoscopy; knee fracture  (Right); Nasal fracture surgery; Total knee arthroplasty (Right, 07/28/2018); Debridement and closure wound (Right, 10/01/2018); Total knee arthroplasty (Left, 03/02/2019); Esophagogastroduodenoscopy (egd) with propofol  (N/A, 11/05/2022); biopsy (11/05/2022); Tonsillectomy; Xi robotic assisted hiatal hernia repair (N/A, 01/31/2023); and Hernia repair. Family History:  Her family history includes Colon cancer in an other family member; Diabetes in her father; Parkinson's disease in her father. Social History:   reports that she quit smoking about 35 years ago. Her smoking use included cigarettes. She started smoking about 65 years ago. She has a 30 pack-year smoking history. She has never used smokeless tobacco. She reports that she does not currently use alcohol. She reports that she does not use drugs.  Prior to Admission medications   Medication Sig Start Date End Date Taking? Authorizing Provider  azelastine  (ASTELIN ) 0.1 % nasal spray Place 2 sprays into both nostrils 2 (two) times daily. Use in each nostril as directed 06/03/23  Yes Tobie Comp B, MD  budesonide  (ENTOCORT EC ) 3 MG 24 hr capsule TAKE 3 CAPSULES (9 MG TOTAL) BY MOUTH DAILY. 08/07/23  Yes McGreal, Inocente HERO, MD  Calcium  Citrate-Vitamin D (CALCIUM  + D PO) Take 1 tablet by mouth 2 (two) times daily.   Yes [provider]  cetirizine  (ZYRTEC ) 10 MG tablet Take 1 tablet (10 mg total) by mouth daily. 07/24/23  Yes Theophilus Andrews, Tully GRADE, MD  dicyclomine  (BENTYL ) 20 MG tablet Take 1 tablet (20 mg total) by mouth 4 (four) times daily as needed for spasms. 11/21/22   Yes Abran Norleen SAILOR, MD  diphenoxylate -atropine  (LOMOTIL ) 2.5-0.025 MG tablet Take 2 tablets by mouth 3 (three) times daily. TAKE 2 TABLETS BY MOUTH IN THE MORNING ,AT NOON, AND AT BEDTIME AS DIRECTED 07/29/23 07/28/24 Yes McGreal, Inocente HERO, MD  DULoxetine  (CYMBALTA ) 60 MG capsule Take 1 capsule (60 mg total) by mouth 2 (two) times daily. 07/24/23  Yes Theophilus Andrews, Tully GRADE, MD  fluticasone  (FLONASE ) 50 MCG/ACT nasal spray SPRAY 2 SPRAYS INTO EACH NOSTRIL EVERY DAY 08/11/23  Yes Theophilus Andrews, Tully GRADE, MD  losartan  (COZAAR ) 100 MG tablet Take 1 tablet (100 mg total) by mouth daily. 07/24/23  Yes Theophilus Andrews, Tully GRADE, MD  meclizine  (ANTIVERT ) 25 MG tablet Take 25 mg by mouth 3 (three) times daily as needed for dizziness.   Yes [provider]  Multiple Vitamin (MULTIVITAMIN WITH MINERALS) TABS tablet Take 1 tablet by mouth daily.   Yes [provider]  omeprazole  (PRILOSEC) 40 MG capsule Take 1 capsule (40 mg total) by mouth daily. 07/29/23  Yes McGreal, Inocente HERO, MD  ondansetron  (ZOFRAN ) 4 MG tablet Take 1 tablet (4 mg total) by mouth every 8 (eight) hours as needed for nausea  or vomiting. 10/13/23  Yes Theophilus Andrews, Tully GRADE, MD  albuterol  (VENTOLIN  HFA) 108 [phone] Base) MCG/ACT inhaler Inhale 2 puffs into the lungs every 6 (six) hours as needed for wheezing or shortness of breath. 05/27/22   Cobb, Comer GAILS, NP  cephALEXin  (KEFLEX ) 500 MG capsule Take 1 capsule (500 mg total) by mouth 2 (two) times daily for 7 days. Patient not taking: Reported on 11/05/2023 10/29/23 11/05/23  Theophilus Andrews, Tully GRADE, MD  doxycycline  (VIBRAMYCIN ) 100 MG capsule Take 1 capsule (100 mg total) by mouth 2 (two) times daily. 08/19/23   Nafziger, Darleene, NP  Iron, Ferrous Sulfate , 325 (65 Fe) MG TABS Take 325 mg by mouth at bedtime. Patient not taking: Reported on 11/05/2023 11/04/23   Theophilus Andrews, Tully GRADE, MD  ofloxacin  (FLOXIN ) 0.3 % OTIC solution PLACE 4 DROPS INTO BOTH EARS 2 (TWO) TIMES  DAILY FOR 5 DAYS.    [provider]  ondansetron  (ZOFRAN -ODT) 4 MG disintegrating tablet 4mg  ODT q4 hours prn nausea/vomit 04/17/23   Floyd, Dan, DO  SKYRIZI  360 MG/2.4ML SOCT Inject 360 mg into the skin every 8 (eight) weeks. Give approximately on 12/29 or after. Patient not taking: Reported on 11/05/2023 01/05/24   Suzann Inocente HERO, MD    Current Facility-Administered Medications  Medication Dose Route Frequency Provider Last Rate Last Admin   0.9 %  sodium chloride  infusion  250 mL Intravenous PRN Sundil, Subrina, MD       acetaminophen  (TYLENOL ) tablet 650 mg  650 mg Oral Q6H PRN Sundil, Subrina, MD       Or   acetaminophen  (TYLENOL ) suppository 650 mg  650 mg Rectal Q6H PRN Sundil, Subrina, MD       albuterol  (PROVENTIL ) (2.5 MG/3ML) 0.083% nebulizer solution 3 mL  3 mL Inhalation Q6H PRN Sundil, Subrina, MD       bismuth subsalicylate (PEPTO BISMOL) 262 MG/15ML suspension 30 mL  30 mL Oral Q4H PRN Sundil, Subrina, MD       cefTRIAXone (ROCEPHIN) 2 g in sodium chloride  0.9 % 100 mL IVPB  2 g Intravenous Q24H Sundil, Subrina, MD       dicyclomine  (BENTYL ) tablet 20 mg  20 mg Oral QID PRN Sundil, Subrina, MD       diphenoxylate -atropine  (LOMOTIL ) 2.5-0.025 MG per tablet 2 tablet  2 tablet Oral TID Sundil, Subrina, MD       ferrous sulfate  tablet 325 mg  325 mg Oral QHS Sundil, Subrina, MD   325 mg at 11/05/23 0145   heparin  injection 5,000 Units  5,000 Units Subcutaneous Q8H Sundil, Subrina, MD   5,000 Units at 11/05/23 0522   metroNIDAZOLE  (FLAGYL ) IVPB 500 mg  500 mg Intravenous Q12H Sundil, Subrina, MD       ondansetron  (ZOFRAN ) tablet 4 mg  4 mg Oral Q6H PRN Sundil, Subrina, MD       Or   ondansetron  (ZOFRAN ) injection 4 mg  4 mg Intravenous Q6H PRN Sundil, Subrina, MD       pantoprazole  (PROTONIX ) EC tablet 40 mg  40 mg Oral Daily Sundil, Subrina, MD       sodium bicarbonate 150 mEq in sterile water  1,150 mL infusion   Intravenous Continuous Sundil, Subrina, MD 125 mL/hr at  11/05/23 0329 New Bag at 11/05/23 0329   sodium chloride  flush (NS) 0.9 % injection 3 mL  3 mL Intravenous Q12H Sundil, Subrina, MD       sodium chloride  flush (NS) 0.9 % injection 3 mL  3 mL  Intravenous Q12H Sundil, Subrina, MD       sodium chloride  flush (NS) 0.9 % injection 3 mL  3 mL Intravenous PRN Sundil, Subrina, MD       Current Outpatient Medications  Medication Sig Dispense Refill   azelastine  (ASTELIN ) 0.1 % nasal spray Place 2 sprays into both nostrils 2 (two) times daily. Use in each nostril as directed 30 mL 12   budesonide  (ENTOCORT EC ) 3 MG 24 hr capsule TAKE 3 CAPSULES (9 MG TOTAL) BY MOUTH DAILY. 270 capsule 2   Calcium  Citrate-Vitamin D (CALCIUM  + D PO) Take 1 tablet by mouth 2 (two) times daily.     cetirizine  (ZYRTEC ) 10 MG tablet Take 1 tablet (10 mg total) by mouth daily. 90 tablet 0   dicyclomine  (BENTYL ) 20 MG tablet Take 1 tablet (20 mg total) by mouth 4 (four) times daily as needed for spasms. 360 tablet 3   diphenoxylate -atropine  (LOMOTIL ) 2.5-0.025 MG tablet Take 2 tablets by mouth 3 (three) times daily. TAKE 2 TABLETS BY MOUTH IN THE MORNING ,AT NOON, AND AT BEDTIME AS DIRECTED 180 tablet 11   DULoxetine  (CYMBALTA ) 60 MG capsule Take 1 capsule (60 mg total) by mouth 2 (two) times daily. 180 capsule 0   fluticasone  (FLONASE ) 50 MCG/ACT nasal spray SPRAY 2 SPRAYS INTO EACH NOSTRIL EVERY DAY 48 mL 0   losartan  (COZAAR ) 100 MG tablet Take 1 tablet (100 mg total) by mouth daily. 90 tablet 0   meclizine  (ANTIVERT ) 25 MG tablet Take 25 mg by mouth 3 (three) times daily as needed for dizziness.     Multiple Vitamin (MULTIVITAMIN WITH MINERALS) TABS tablet Take 1 tablet by mouth daily.     omeprazole  (PRILOSEC) 40 MG capsule Take 1 capsule (40 mg total) by mouth daily. 90 capsule 3   ondansetron  (ZOFRAN ) 4 MG tablet Take 1 tablet (4 mg total) by mouth every 8 (eight) hours as needed for nausea or vomiting. 90 tablet 0   albuterol  (VENTOLIN  HFA) 108 (90 Base) MCG/ACT inhaler  Inhale 2 puffs into the lungs every 6 (six) hours as needed for wheezing or shortness of breath. 8 g 6   cephALEXin  (KEFLEX ) 500 MG capsule Take 1 capsule (500 mg total) by mouth 2 (two) times daily for 7 days. (Patient not taking: Reported on 11/05/2023) 14 capsule 0   doxycycline  (VIBRAMYCIN ) 100 MG capsule Take 1 capsule (100 mg total) by mouth 2 (two) times daily. 14 capsule 0   Iron, Ferrous Sulfate , 325 (65 Fe) MG TABS Take 325 mg by mouth at bedtime. (Patient not taking: Reported on 11/05/2023)     ofloxacin  (FLOXIN ) 0.3 % OTIC solution PLACE 4 DROPS INTO BOTH EARS 2 (TWO) TIMES DAILY FOR 5 DAYS.     ondansetron  (ZOFRAN -ODT) 4 MG disintegrating tablet 4mg  ODT q4 hours prn nausea/vomit 20 tablet 0   [START ON 01/05/2024] SKYRIZI  360 MG/2.4ML SOCT Inject 360 mg into the skin every 8 (eight) weeks. Give approximately on 12/29 or after. (Patient not taking: Reported on 11/05/2023) 2.4 mL 5    Allergies as of 11/04/2023 - Review Complete 11/04/2023  Allergen Reaction Noted   Aspirin Other (See Comments) 04/09/2018   Latex Other (See Comments) 04/09/2018   Other Itching 07/28/2018   Sulfa antibiotics Nausea And Vomiting, Nausea Only, Other (See Comments), and Swelling 04/28/2019   Adhesive [tape] Other (See Comments) 10/08/2018   Ciprofloxacin Nausea And Vomiting 07/28/2018   Codeine Nausea And Vomiting 04/09/2018   Demerol [meperidine hcl] Nausea And Vomiting 06/30/2018  Fentanyl  Nausea And Vomiting 05/02/2021   Lactose intolerance (gi) Other (See Comments) 06/30/2018   Prednisone  Other (See Comments) 06/27/2021   Septra [sulfamethoxazole-trimethoprim] Nausea Only 04/09/2018    Review of Systems  Constitutional:  Negative for chills, fever and weight loss.  HENT:  Negative for hearing loss.   Eyes:  Negative for blurred vision and double vision.  Respiratory:  Negative for cough and hemoptysis.   Cardiovascular:  Negative for chest pain and palpitations.  Gastrointestinal:  Positive  for abdominal pain and diarrhea. Negative for blood in stool, constipation, heartburn, melena, nausea and vomiting.  Genitourinary:  Negative for dysuria and urgency.  Musculoskeletal:  Negative for myalgias and neck pain.  Skin:  Negative for itching and rash.  Neurological:  Negative for seizures and loss of consciousness.  Psychiatric/Behavioral:  Negative for depression and suicidal ideas.        Physical Exam:  Vital signs in last 24 hours: Temp:  [97.7 F (36.5 C)-98.3 F (36.8 C)] 98.3 F (36.8 C) (10/29 0759) Pulse Rate:  [80-88] 85 (10/29 0759) Resp:  [12-21] 12 (10/29 0759) BP: (102-148)/(55-83) 146/67 (10/29 0759) SpO2:  [93 %-99 %] 99 % (10/29 0759)   Last BM recorded by nurses in past 5 days No data recorded  Physical Exam Constitutional:      Appearance: She is ill-appearing.  HENT:     Head: Normocephalic and atraumatic.     Nose: Nose normal. No congestion.     Mouth/Throat:     Mouth: Mucous membranes are moist.     Pharynx: Oropharynx is clear.  Eyes:     General: No scleral icterus.    Extraocular Movements: Extraocular movements intact.  Cardiovascular:     Rate and Rhythm: Normal rate and regular rhythm.  Pulmonary:     Effort: Pulmonary effort is normal.     Breath sounds: Normal breath sounds.  Abdominal:     General: Bowel sounds are normal. There is no distension.     Palpations: Abdomen is soft. There is no mass.     Tenderness: There is abdominal tenderness.  Musculoskeletal:        General: No swelling. Normal range of motion.     Cervical back: Normal range of motion. No rigidity.  Skin:    General: Skin is warm and dry.  Neurological:     General: No focal deficit present.     Mental Status: She is alert and oriented to person, place, and time.  Psychiatric:        Mood and Affect: Mood normal.        Behavior: Behavior normal.        Thought Content: Thought content normal.        Judgment: Judgment normal.      LAB  RESULTS: Recent Labs    11/04/23 2209 11/05/23 0501  WBC 18.4* 17.4*  HGB 11.2* 10.7*  HCT 36.1 34.6*  PLT 388 341   BMET Recent Labs    11/04/23 2209 11/05/23 0138 11/05/23 0501  NA 128* 130* 129*  K 6.9* 5.6* 5.2*  CL 101 104 100  CO2 11* 12* 14*  GLUCOSE 102* 99 86  BUN 111* 112* 111*  CREATININE 7.89* 7.69* 7.67*  CALCIUM  10.3 10.0 11.9*   LFT Recent Labs    11/05/23 0501  PROT 7.3  ALBUMIN 3.6  AST 13*  ALT 10  ALKPHOS 93  BILITOT 0.3   PT/INR No results for input(s): LABPROT, INR in the last 72 hours.  STUDIES: CT ABDOMEN PELVIS WO CONTRAST Result Date: 11/05/2023 EXAM: CT ABDOMEN AND PELVIS WITHOUT CONTRAST 11/05/2023 12:03:40 AM TECHNIQUE: CT of the abdomen and pelvis was performed without the administration of intravenous contrast. Multiplanar reformatted images are provided for review. Automated exposure control, iterative reconstruction, and/or weight-based adjustment of the mA/kV was utilized to reduce the radiation dose to as low as reasonably achievable. COMPARISON: None available. CLINICAL HISTORY: Diarrhea; new Skyrizi  infusions, diarrhea, abdominal pain. Diarrhea for 3 weeks, recently started a new medication for Crohn's, GFR 5. Crohn's disease. FINDINGS: LOWER CHEST: Extensive right coronary artery calcification. Global cardiac size within normal limits. LIVER: The liver is unremarkable. GALLBLADDER AND BILE DUCTS: Gallbladder is unremarkable. No biliary ductal dilatation. SPLEEN: No acute abnormality. PANCREAS: No acute abnormality. ADRENAL GLANDS: No acute abnormality. KIDNEYS, URETERS AND BLADDER: No stones in the kidneys or ureters. No hydronephrosis. No perinephric or periureteral stranding. Urinary bladder is unremarkable. GI AND BOWEL: Stomach demonstrates no acute abnormality. The transverse, descending, and rectosigmoid colon demonstrate an ahaustral featureless appearance in keeping with recurrent proctocolitis, compatible with the patient's  history of inflammatory bowel disease. There is superimposed mild circumferential bowel wall thickening and pericolonic inflammatory stranding and vascular engorgement involving the transverse and descending colon in keeping with changes of active colitis. No evidence of obstruction or perforation. No free intraperitoneal gas or fluid. PERITONEUM AND RETROPERITONEUM: No ascites. No free air. VASCULATURE: Moderate aortoiliac atherosclerotic calcification. No aortic aneurysm. LYMPH NODES: No lymphadenopathy. REPRODUCTIVE ORGANS: No acute abnormality. BONES AND SOFT TISSUES: Marked thoracolumbar dextroscoliosis, apex right at T9 and lumbar compensatory levoscoliosis with left lateral listhesis centered at L4-L5 is noted with associated degenerative changes, relatively advanced at L4 - S1. No acute bone abnormality. No lytic or blastic bone lesion. No focal soft tissue abnormality. IMPRESSION: 1. Active colitis involving the transverse and descending colon with mild circumferential wall thickening, pericolonic inflammatory stranding, and vascular engorgement; compatible with Crohns disease history. No obstruction or perforation. No free intraperitoneal gas or fluid. 2. Marked thoracolumbar sigmoid colpocele 3. Extensive right coronary artery calcification. Electronically signed by: Dorethia Molt MD 11/05/2023 12:16 AM EDT RP Workstation: [id number]      Impression    Crohn's disease Recent colonoscopy 07/2023 with stenosis in proximal ascending colon biopsies negative for dysplasia but showed active inflammation 2 doses of Skyrizi  (last dose 1 week ago) Recent antibiotic use of Macrobid and Keflex  Leukocytosis 17.4 CT with pancolitis Was given rifaximin  for positive SIBO, budesonide  taper, Lomotil , Imodium  with no improvement.  previously tried and failed mesalamine . Due to stenosing Crohn's patient was placed on Skyrizi .  Diarrhea acutely worsened her first dose of Skyrizi  5 weeks ago, although patient did  receive antibiotics for UTI.  Suspect Crohn's flare versus infectious colitis.  Hyponatremia, hyperkalemia  AKI on CKD 3 Likely in the setting of acute on chronic diarrhea Cr. 7.67, BUN 111  Paraesophageal hiatal hernia S/p surgery 01/2023  Pancreatic lesions [facility]   Plan   -Correct electrolyte abnormalities - IVF - GI pathogen panel and C. difficile stool studies to evaluate for infection - If the above are negative, will manage for assumed Crohn's flare - Supportive care - Consider CRP/ESR  Thank you for your kind consultation, we will continue to follow.   Bayley CHRISTELLA Blower  11/05/2023, 9:30 AM     Attending physician's note   I have taken history, reviewed the chart and examined the patient. I performed a substantive portion of this encounter, including complete performance of at least one of  the key components, in conjunction with the APP. I agree with the Advanced Practitioner's note, impression and recommendations.   Acute on chronic diarrhea- ?etiology.  Crohn's exacerbation vs infectious etiology. Pt and husband convinced that it was d/t Skyrizi  (2nd dose 10/24 and refused to go back on Skyrizi ). CT AP with pancolitis.  Mildly active Crohn's colitis with Px ascending colon stricture on colonoscopy 07/2023.  Being followed by Dr. Suzann  Chronic diarrhea-likely multifactorial d/t mild Crohns, bile acid malabsorption or IBS. Pt did not respond to rifaximin  for SIBO or trial of pancreatic enzymes for EPI, Neg celiac serology and neg SB Bx on EGD for celiac.  Colestid  pills were too big to swallow. Neg alpha gal and 7 alpha C4 test. Has been given trial of budesonide  as well.  AKI on CKD3 - d/t dehydration. Also H/O frequent UTIs.  Plan: -IVF -Stool studies for C. difficile, GI pathogens. -Follow urine culture, blood cultures -Trend CBC, CMP.  -Check CRP, sed rate in AM (not ordered) -Appreciate renal consultation. Pt being transferred to [facility] for better  renal support, if needed -If above stool studies and cultures are neg, will consider IV steroids in next few days. -Consider cholestyramine powder if continued diarrhea as outpt  D/W pt, pt's husband and Dr Suzann. Also informed our inpt team at [facility] Husband very involved in care. Pl run it by him before any change in therapy.   Anselm Bring, MD [facility] [phone]

## 2023-11-05 NOTE — ED Notes (Signed)
 Unable to obtain stool sample. Pt instructed to defecate into hat and call nurse when finished. Pt called nurse into restroom after she had dumped stool into toilet and cleaned up.

## 2023-11-05 NOTE — Progress Notes (Signed)
 Patient admitted earlier this morning for diarrhea and was found to have AKI/acute metabolic acidosis and hyperkalemia.  Patient seen and examined at bedside and plan of care discussed with her.  I have reviewed patient's medical records including this morning's H&P, current vitals, labs, medications myself.  Patient currently on IV antibiotics and bicarb drip.  Awaiting nephrology evaluation and transfer to [facility].  Repeat a.m. labs.

## 2023-11-05 NOTE — H&P (Signed)
 History and Physical    [name] FMW:[id number] DOB: [birth year] DOA: 11/04/2023  PCP: Theophilus Andrews, Tully GRADE, MD   Patient coming from: Home   Chief Complaint:  Chief Complaint  Patient presents with   Diarrhea   ED TRIAGE note:  Pt. Arrives via [facility] from home for diarrhea. Recently started new medication for crohn's disease. Since starting the medication, pt. Reports more flare ups. Diarrhea has lasted for about 3 weeks. Denies recent antibiotic use. Has had decreased appetite and poor fluid intake. Goven LR with EMS. 18 g in RAC      HPI:  [name] is a 78 y.o. 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 Patient reported that GI switch to biologic agent due to stricture in the colon.  Her infusion in September went well however she developed diarrhea 3 weeks ago.  Have subsequent infection this past Friday and did not tolerated it well.  All the weekend she has being lying in the bed not feeling well.  Patient reported she has generalized weakness and too weak to hold a glass and not eating and drinking well.  Reported she took antibiotic last month for UTI.  Denies any fever, nausea and vomiting.  EMS gave IV fluid patient reported after that she is feeling little well.  My evaluation at the bedside patient reported that she is feeling generalized very weak and tired.  Having poor appetite.  Denies any abdominal pain nausea vomiting however continues to have loose stool 2-3 times a day.  Having poor appetite.  Denies any fever and chills.  Denies any chest pain or shortness of breath.  No other complaint at this time.   ED Course:  At presentation to ED patient is hemodynamically stable.  Afebrile. Lab, pending C.  difficile and GI panel. CBC showing leukocytosis 18, stable H&H and normal platelet count. CMP showing low sodium 128, elevated potassium 6.9, elevated creatinine 7.89, elevated BUN 111 low bicarb 11 and anion gap 16. Blood cultures are in process.  EKG showing normal sinus rhythm heart rate 78.  CT abdomen pelvis showing active colitis involving the transverse colon and descending colon with mild circumferential wall thickening, pericolonic inflammatory stranding, and vascular engorgement; compatible with Crohns disease history. No obstruction or perforation. No free intraperitoneal gas or fluid. 2. Marked thoracolumbar sigmoid colpocele 3. Extensive right coronary artery calcification.   In the ED patient received hyperkalemia cocktail ceftriaxone and metronidazole . Patient does not have any EKG change with hyperkalemia.  Inform EDP to consult gastroenterology to evaluate patient. Paged on-call nephrology Dr. Rayburn discussed patient case with nephrology who will evaluate patient in the daytime.  Admitting patient to [facility] in case patient will need dialysis with worsening renal function.  Hospitalist consulted for management of acute kidney injury, hyperkalemia, metabolic acidosis, and acute colitis.    Significant labs in the ED: Lab Orders         Gastrointestinal Panel by PCR , Stool         C Difficile Quick Screen w PCR reflex         Blood culture (routine x 2)         Lipase, blood         Comprehensive metabolic panel         CBC  Urinalysis, Routine w reflex microscopic -Urine, Clean Catch         Magnesium          Creatinine, urine, random         Sodium, urine, random         Urinalysis, Routine w reflex microscopic -Urine, Clean Catch         CBC         Osmolality, urine         Osmolality         Comprehensive metabolic panel         CBG monitoring, ED       Review of Systems:  Review of Systems  Constitutional:  Positive for  malaise/fatigue. Negative for chills, fever and weight loss.  Respiratory:  Negative for cough and shortness of breath.   Cardiovascular:  Negative for chest pain and palpitations.  Gastrointestinal:  Positive for diarrhea. Negative for abdominal pain, blood in stool, constipation, heartburn, melena, nausea and vomiting.  Musculoskeletal:  Negative for myalgias.  Neurological:  Positive for weakness. Negative for dizziness, loss of consciousness and headaches.  Psychiatric/Behavioral:  The patient is not nervous/anxious.     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   Whooping cough 2015   Whooping cough with pneumonia    2017    Past Surgical History:  Procedure Laterality Date   BIOPSY  11/05/2022   Procedure: BIOPSY;  Surgeon: Albertus Gordy HERO, MD;  Location: [facility] ENDOSCOPY;  Service: Gastroenterology;;   CATARACT EXTRACTION, BILATERAL  2018   with IOC implant    COLONOSCOPY     DEBRIDEMENT AND CLOSURE WOUND Right 10/01/2018   Procedure: Excision of right knee wound with placement of ACell and Pravena;  Surgeon: Lowery Estefana RAMAN, DO;  Location: [facility];  Service: Plastics;  Laterality: Right;  30 min   ESOPHAGOGASTRODUODENOSCOPY (EGD) WITH PROPOFOL  N/A 11/05/2022   Procedure: ESOPHAGOGASTRODUODENOSCOPY (EGD) WITH PROPOFOL ;  Surgeon: Albertus Gordy HERO, MD;  Location: [facility] ENDOSCOPY;  Service: Gastroenterology;  Laterality:  N/A;   HERNIA REPAIR     knee fracture  Right    NASAL FRACTURE SURGERY     needed b/c she was getting frequent sinus infections    TONSILLECTOMY     TOTAL KNEE ARTHROPLASTY Right 07/28/2018   Procedure: TOTAL KNEE ARTHROPLASTY;  Surgeon: Ernie Cough, MD;  Location: [facility] ORS;  Service: Orthopedics;  Laterality: Right;   TOTAL KNEE ARTHROPLASTY Left 03/02/2019   Procedure: TOTAL KNEE ARTHROPLASTY;  Surgeon: Ernie Cough, MD;  Location: [facility];  Service: Orthopedics;  Laterality: Left;  70 mins   UPPER GASTROINTESTINAL ENDOSCOPY     XI ROBOTIC ASSISTED HIATAL HERNIA REPAIR N/A 01/31/2023   Procedure: XI ROBOTIC ASSISTED HIATAL HERNIA REPAIR WITH MESH;  Surgeon: Lyndel Deward PARAS,  MD;  Location: [facility];  Service: General;  Laterality: N/A;     reports that she quit smoking about 35 years ago. Her smoking use included cigarettes. She started smoking about 65 years ago. She has a 30 pack-year smoking history. She has never used smokeless tobacco. She reports that she does not currently use alcohol. She reports that she does not use drugs.  Allergies  Allergen Reactions   Aspirin Other (See Comments)    Intestinal Bleeding   Latex Other (See Comments)    Redness and skin peeling   Other Itching    Nuts-itching in throat  Seeds-stomach issues with chrons  Pt has emphysema    Skyrizi  [Risankizumab ]     Excessive Diarrhea   Sulfa Antibiotics Nausea And Vomiting, Nausea Only, Other (See Comments) and Swelling   Adhesive [Tape] Other (See Comments)    Redness and skin peeling   Ciprofloxacin Nausea And Vomiting   Codeine Nausea And Vomiting   Demerol [Meperidine Hcl] Nausea And Vomiting   Fentanyl  Nausea And Vomiting    Makes me very sick   Lactose Intolerance (Gi) Other (See Comments)    Pt has Crohn's    Prednisone  Other (See Comments)    Upset stomach   Septra [Sulfamethoxazole-Trimethoprim] Nausea Only    Family History  Problem Relation Age of Onset   Diabetes Father     Parkinson's disease Father    Colon cancer Other    Asthma Neg Hx    Esophageal cancer Neg Hx    Pancreatic cancer Neg Hx    Liver disease Neg Hx    Stomach cancer Neg Hx     Prior to Admission medications   Medication Sig Start Date End Date Taking? Authorizing Provider  albuterol  (VENTOLIN  HFA) 108 (90 Base) MCG/ACT inhaler Inhale 2 puffs into the lungs every 6 (six) hours as needed for wheezing or shortness of breath. 05/27/22   Cobb, Comer GAILS, NP  azelastine  (ASTELIN ) 0.1 % nasal spray Place 2 sprays into both nostrils 2 (two) times daily. Use in each nostril as directed 06/03/23   Tobie Eldora NOVAK, MD  budesonide  (ENTOCORT EC ) 3 MG 24 hr capsule TAKE 3 CAPSULES (9 MG TOTAL) BY MOUTH DAILY. 08/07/23   Suzann Inocente HERO, MD  Calcium  Citrate-Vitamin D (CALCIUM  + D PO) Take 1 tablet by mouth 2 (two) times daily.    [provider]  cephALEXin  (KEFLEX ) 500 MG capsule Take 1 capsule (500 mg total) by mouth 2 (two) times daily for 7 days. 10/29/23 11/05/23  Theophilus Andrews, Tully GRADE, MD  cetirizine  (ZYRTEC ) 10 MG tablet Take 1 tablet (10 mg total) by mouth daily. 07/24/23   Theophilus Andrews, Tully GRADE, MD  dicyclomine  (BENTYL ) 20 MG tablet Take 1 tablet (20 mg total) by mouth 4 (four) times daily as needed for spasms. 11/21/22   Abran Norleen SAILOR, MD  diphenoxylate -atropine  (LOMOTIL ) 2.5-0.025 MG tablet Take 2 tablets by mouth 3 (three) times daily. TAKE 2 TABLETS BY MOUTH IN THE MORNING ,AT NOON, AND AT BEDTIME AS DIRECTED 07/29/23 07/28/24  Suzann Inocente HERO, MD  doxycycline  (VIBRAMYCIN ) 100 MG capsule Take 1 capsule (100 mg total) by mouth 2 (two) times daily. 08/19/23   Nafziger, Darleene, NP  DULoxetine  (CYMBALTA ) 60 MG capsule Take 1 capsule (60 mg total) by mouth 2 (two) times daily. 07/24/23   Theophilus Andrews, Tully GRADE, MD  fluticasone  (FLONASE ) 50 MCG/ACT nasal spray SPRAY 2 SPRAYS INTO EACH NOSTRIL EVERY DAY 08/11/23   Theophilus Andrews, [place]  Y, MD  furosemide  (LASIX ) 20 MG tablet if needed.  04/14/23   [provider]  Iron, Ferrous Sulfate , 325 (65 Fe) MG TABS Take 325 mg by mouth at bedtime. 11/04/23   Theophilus Andrews, Tully GRADE, MD  losartan  (COZAAR ) 100 MG tablet Take 1 tablet (100 mg total) by mouth daily. 07/24/23   Theophilus Andrews, Tully GRADE, MD  Multiple Vitamin (MULTIVITAMIN WITH MINERALS) TABS tablet Take 1 tablet by mouth daily.    [provider]  mupirocin  ointment (BACTROBAN ) 2 % Apply 1 Application topically. 04/17/22   [provider]  ofloxacin  (FLOXIN ) 0.3 % OTIC solution PLACE 4 DROPS INTO BOTH EARS 2 (TWO) TIMES DAILY FOR 5 DAYS.    [provider]  omeprazole  (PRILOSEC) 40 MG capsule Take 1 capsule (40 mg total) by mouth daily. 07/29/23   Suzann Inocente HERO, MD  ondansetron  (ZOFRAN ) 4 MG tablet Take 1 tablet (4 mg total) by mouth every 8 (eight) hours as needed for nausea or vomiting. 10/13/23   Theophilus Andrews, Tully GRADE, MD  ondansetron  (ZOFRAN -ODT) 4 MG disintegrating tablet 4mg  ODT q4 hours prn nausea/vomit 04/17/23   Floyd, Dan, DO  SKYRIZI  360 MG/2.4ML SOCT Inject 360 mg into the skin every 8 (eight) weeks. Give approximately on 12/29 or after. 01/05/24   Suzann Inocente HERO, MD  triamcinolone  cream (KENALOG ) 0.1 % Apply topically. 09/16/19   [provider]  valACYclovir  (VALTREX ) 1000 MG tablet TAKE 1 TABLET BY MOUTH TWICE A DAY AS NEEDED 07/07/23   Theophilus Andrews, Tully GRADE, MD     Physical Exam: Vitals:   11/04/23 2145 11/04/23 2345  BP: (!) 102/55 125/83  Pulse: 80 88  Resp: 16 20  Temp: 97.7 F (36.5 C) 98.1 F (36.7 C)  TempSrc: Oral Oral  SpO2: 93% 94%    Physical Exam Vitals and nursing note reviewed.  Constitutional:      General: She is not in acute distress.    Appearance: She is ill-appearing.  HENT:     Mouth/Throat:     Mouth: Mucous membranes are moist.  Eyes:     Pupils: Pupils are equal, round, and reactive to light.  Cardiovascular:     Rate and Rhythm: Normal rate and regular rhythm.      Pulses: Normal pulses.     Heart sounds: Normal heart sounds.  Pulmonary:     Effort: Pulmonary effort is normal.     Breath sounds: Normal breath sounds.  Abdominal:     General: There is no distension.     Palpations: Abdomen is soft.     Tenderness: There is no abdominal tenderness. There is no guarding or rebound.  Musculoskeletal:     Cervical back: Neck supple.  Skin:    General: Skin is dry.     Capillary Refill: Capillary refill takes less than 2 seconds.  Neurological:     Mental Status: She is alert and oriented to person, place, and time.  Psychiatric:        Mood and Affect: Mood normal.      Labs on Admission: I have personally reviewed following labs and imaging studies  CBC: Recent Labs  Lab 10/30/23 1103 11/04/23 2209  WBC 12.3* 18.4*  NEUTROABS 9.1*  --   HGB 11.1* 11.2*  HCT 34.5* 36.1  MCV 89.1 91.2  PLT 381.0 388   Basic Metabolic Panel: Recent Labs  Lab 10/30/23 1103 11/04/23 2209 11/04/23 2217 11/05/23 0138  NA 135 128*  --  130*  K 4.4 6.9*  --  5.6*  CL 102 101  --  104  CO2 23 11*  --  12*  GLUCOSE 111* 102*  --  99  BUN 45* 111*  --  112*  CREATININE 1.25* 7.89*  --  7.69*  CALCIUM  9.3 10.3  --  10.0  MG 1.6  --  2.1  --    GFR: Estimated Creatinine Clearance: 4.3 mL/min (A) (by C-G formula based on SCr of 7.69 mg/dL (H)). Liver Function Tests: Recent Labs  Lab 10/30/23 1103 11/04/23 2209  AST 12 24  ALT 10 11  ALKPHOS 75 97  BILITOT 0.3 0.3  PROT 7.7 7.6  ALBUMIN 3.7 3.6   Recent Labs  Lab 11/04/23 2209  LIPASE 58*   No results for input(s): AMMONIA in the last 168 hours. Coagulation Profile: No results for input(s): INR, PROTIME in the last 168 hours. Cardiac Enzymes: No results for input(s): CKTOTAL, CKMB, CKMBINDEX, TROPONINI, TROPONINIHS in the last 168 hours. BNP (last 3 results) No results for input(s): BNP in the last 8760 hours. HbA1C: No results for input(s): HGBA1C in the last 72  hours. CBG: Recent Labs  Lab 11/05/23 0028  GLUCAP 154*   Lipid Profile: No results for input(s): CHOL, HDL, LDLCALC, TRIG, CHOLHDL, LDLDIRECT in the last 72 hours. Thyroid  Function Tests: No results for input(s): TSH, T4TOTAL, FREET4, T3FREE, THYROIDAB in the last 72 hours. Anemia Panel: No results for input(s): VITAMINB12, FOLATE, FERRITIN, TIBC, IRON, RETICCTPCT in the last 72 hours. Urine analysis:    Component Value Date/Time   COLORURINE YELLOW 04/16/2023 1936   APPEARANCEUR CLEAR 04/16/2023 1936   LABSPEC 1.019 04/16/2023 1936   PHURINE 5.5 04/16/2023 1936   GLUCOSEU NEGATIVE 04/16/2023 1936   GLUCOSEU NEGATIVE 02/16/2019 1534   HGBUR NEGATIVE 04/16/2023 1936   BILIRUBINUR neg 10/29/2023 1652   KETONESUR TRACE (A) 04/16/2023 1936   PROTEINUR Positive (A) 10/29/2023 1652   PROTEINUR 30 (A) 04/16/2023 1936   UROBILINOGEN 0.2 10/29/2023 1652   UROBILINOGEN 0.2 02/16/2019 1534   NITRITE neg 10/29/2023 1652   NITRITE NEGATIVE 04/16/2023 1936   LEUKOCYTESUR Trace (A) 10/29/2023 1652   LEUKOCYTESUR NEGATIVE 04/16/2023 1936    Radiological Exams on Admission: I have personally reviewed images CT ABDOMEN PELVIS WO CONTRAST Result Date: 11/05/2023 EXAM: CT ABDOMEN AND PELVIS WITHOUT CONTRAST 11/05/2023 12:03:40 AM TECHNIQUE: CT of the abdomen and pelvis was performed without the administration of intravenous contrast. Multiplanar reformatted images are provided for review. Automated exposure control, iterative reconstruction, and/or weight-based adjustment of the mA/kV was utilized to reduce the radiation dose to as low as reasonably achievable. COMPARISON: None available. CLINICAL HISTORY: Diarrhea; new Skyrizi  infusions, diarrhea, abdominal pain. Diarrhea for 3 weeks, recently started a new medication for Crohn's, GFR 5. Crohn's disease. FINDINGS: LOWER CHEST: Extensive right coronary artery calcification. Global cardiac size within normal  limits. LIVER: The liver is unremarkable. GALLBLADDER AND BILE DUCTS: Gallbladder is unremarkable. No biliary ductal dilatation. SPLEEN: No acute abnormality. PANCREAS: No acute abnormality. ADRENAL GLANDS: No acute abnormality. KIDNEYS, URETERS AND BLADDER: No stones in the kidneys or ureters. No hydronephrosis. No perinephric or periureteral stranding. Urinary bladder is unremarkable. GI AND BOWEL: Stomach demonstrates no acute abnormality. The transverse, descending, and rectosigmoid colon demonstrate an ahaustral featureless appearance in keeping with recurrent proctocolitis, compatible with the patient's history of inflammatory bowel disease. There is superimposed mild circumferential bowel wall thickening and pericolonic inflammatory stranding and vascular engorgement involving the transverse and descending colon in keeping with  changes of active colitis. No evidence of obstruction or perforation. No free intraperitoneal gas or fluid. PERITONEUM AND RETROPERITONEUM: No ascites. No free air. VASCULATURE: Moderate aortoiliac atherosclerotic calcification. No aortic aneurysm. LYMPH NODES: No lymphadenopathy. REPRODUCTIVE ORGANS: No acute abnormality. BONES AND SOFT TISSUES: Marked thoracolumbar dextroscoliosis, apex right at T9 and lumbar compensatory levoscoliosis with left lateral listhesis centered at L4-L5 is noted with associated degenerative changes, relatively advanced at L4 - S1. No acute bone abnormality. No lytic or blastic bone lesion. No focal soft tissue abnormality. IMPRESSION: 1. Active colitis involving the transverse and descending colon with mild circumferential wall thickening, pericolonic inflammatory stranding, and vascular engorgement; compatible with Crohns disease history. No obstruction or perforation. No free intraperitoneal gas or fluid. 2. Marked thoracolumbar sigmoid colpocele 3. Extensive right coronary artery calcification. Electronically signed by: Dorethia Molt MD 11/05/2023  12:16 AM EDT RP Workstation: [id number]     EKG: My personal interpretation of EKG shows: Normal sinus rhythm heart rate 78.  No evidence of wide QRS and tall T waves.    Assessment/Plan: Principal Problem:   Acute kidney injury superimposed on chronic kidney disease Active Problems:   Acute colitis   Hyperkalemia   Hyponatremia   Paraesophageal hernia   IBS (irritable bowel syndrome)   Generalized anxiety disorder   Schatzki's ring   Hx of gastroesophageal reflux (GERD)   History of rheumatoid arthritis   Essential hypertension    Assessment and Plan: Acute kidney injury CKD stage IIIa Elevated BUN -Presented to emergency department complaining of poor appetite, generalized weakness ongoing diarrhea for last few weeks.  Patient did recently been started on biologic infusion for Crohn's disease and after the second infusion patient's diarrhea has been getting worse.  Denies any fever or chill and abdominal pain. - At presentation to ED patient is hemodynamically stable.  Afebrile. -CMP showed elevated creatinine 7.89 (baseline creatinine around 1.2-2.08) and elevated BUN 112.  Plan BUN at around 41-68. - Prerenal acute kidney injury in the setting of persistent diarrhea alongside with poor oral intake. -Pending UA, urine creatinine and urine sodium level. - In the ED patient received 1 L of NS bolus. - In the setting of metabolic acidosis starting sodium bicarb drip for 1 day. - Continue to monitor improvement of renal function, avoid nephrotoxic agent, and renally adjust medications.  Monitor urine output, -Consulted on-call nephrology.  Given patient is making urine and hyperkalemia has been improved there is no indication for emergent dialysis tonight. - Admitting patient to [facility] in case renal function get worse and patient will need dialysis at some point.   History of chronic diarrhea History of IBS History of Crohn's disease Acute colitis -Patient recently been  started on IV biologic infusion for management of Crohn's disease. -CT abdomen pelvis showing acute colitis.  Patient has ongoing chronic diarrhea. -Checking GI, C. difficile panel.  Pending blood cultures. - Continue IV ceftriaxone and metronidazole . -Continue Bentyl  20 mg every 6 hour as needed -Holding Lomotil  until C. difficile rules out. -EDP consulted [facility] for further evaluation in the daytime.   Hyperkalemia -Elevated potassium 6.9.  Hyperkalemia in setting of AKI. - In ED patient received hyperkalemia cocktail.  EKG showed normal sinus rhythm heart rate 77 without any EKG change for hyperkalemia. - Continue check potassium level every 6 hours. Update - Repeat BMP showing improvement of potassium level to 5.6.  Giving another dose of calcium  gluconate only.   Hyponatremia Initial sodium 128 improved to 130 liter of NS bolus  in the ED. Hyponatremia in the setting of AKI in the context of persistent diarrhea.  Currently on bicarb drip.  Continue monitor sodium level.   History of rheumatoid arthritis -Currently not on any medications for maintenance   Generalized anxiety disorder - Pharmacy recommended hold Cymbalta  in the setting of low crcl<30  GERD - Continue omeprazole    Essential hypertension - Blood pressure is borderline soft.  Holding losartan  in the setting of AKI and hyperkalemia  DVT prophylaxis:  SQ Heparin  Code Status:  Full Code Diet: Heart healthy diet Disposition Plan: Need to follow-up with blood culture, GI panel and C. difficile panel.  Need to follow-up with improvement of renal function, hyponatremia and hyperkalemia. Consults: Nephrology Admission status:   Inpatient, progressive unit at [facility]  Severity of Illness: The appropriate patient status for this patient is INPATIENT. Inpatient status is judged to be reasonable and necessary in order to provide the required intensity of service to ensure the patient's safety. The  patient's presenting symptoms, physical exam findings, and initial radiographic and laboratory data in the context of their chronic comorbidities is felt to place them at high risk for further clinical deterioration. Furthermore, it is not anticipated that the patient will be medically stable for discharge from the hospital within 2 midnights of admission.   * I certify that at the point of admission it is my clinical judgment that the patient will require inpatient hospital care spanning beyond 2 midnights from the point of admission due to high intensity of service, high risk for further deterioration and high frequency of surveillance required.DEWAINE    Akon Reinoso, MD [facility]  How to contact the [facility] Attending or Consulting provider 7A - 7P or covering provider during after hours 7P -7A, for this patient.  Check the care team in [facility] and look for a) attending/consulting [facility] provider listed and b) the [facility] team listed Log into [email or web address] and use [facility] universal password to access. If you do not have the password, please contact the hospital operator. Locate the [facility] provider you are looking for under [facility] and page to a number that you can be directly reached. If you still have difficulty reaching the provider, please page the [facility] (Director on Call) for the Hospitalists listed on amion for assistance.  11/05/2023, 3:28 AM

## 2023-11-06 DIAGNOSIS — K5010 Crohn's disease of large intestine without complications: Secondary | ICD-10-CM

## 2023-11-06 DIAGNOSIS — K580 Irritable bowel syndrome with diarrhea: Secondary | ICD-10-CM | POA: Diagnosis not present

## 2023-11-06 DIAGNOSIS — R197 Diarrhea, unspecified: Secondary | ICD-10-CM | POA: Diagnosis not present

## 2023-11-06 DIAGNOSIS — N189 Chronic kidney disease, unspecified: Secondary | ICD-10-CM | POA: Diagnosis not present

## 2023-11-06 DIAGNOSIS — N179 Acute kidney failure, unspecified: Secondary | ICD-10-CM | POA: Diagnosis not present

## 2023-11-06 DIAGNOSIS — I10 Essential (primary) hypertension: Secondary | ICD-10-CM | POA: Diagnosis not present

## 2023-11-06 DIAGNOSIS — K222 Esophageal obstruction: Secondary | ICD-10-CM | POA: Diagnosis not present

## 2023-11-06 LAB — SEDIMENTATION RATE: Sed Rate: 30 mm/h — ABNORMAL HIGH (ref 0–22)

## 2023-11-06 LAB — COMPREHENSIVE METABOLIC PANEL WITH GFR
ALT: 11 U/L (ref 0–44)
AST: 13 U/L — ABNORMAL LOW (ref 15–41)
Albumin: 2.6 g/dL — ABNORMAL LOW (ref 3.5–5.0)
Alkaline Phosphatase: 62 U/L (ref 38–126)
Anion gap: 15 (ref 5–15)
BUN: 116 mg/dL — ABNORMAL HIGH (ref 8–23)
CO2: 16 mmol/L — ABNORMAL LOW (ref 22–32)
Calcium: 8.6 mg/dL — ABNORMAL LOW (ref 8.9–10.3)
Chloride: 103 mmol/L (ref 98–111)
Creatinine, Ser: 6.96 mg/dL — ABNORMAL HIGH (ref 0.44–1.00)
GFR, Estimated: 6 mL/min — ABNORMAL LOW (ref >60)
Glucose, Bld: 100 mg/dL — ABNORMAL HIGH (ref 70–99)
Potassium: 4.3 mmol/L (ref 3.5–5.1)
Sodium: 134 mmol/L — ABNORMAL LOW (ref 135–145)
Total Bilirubin: 0.8 mg/dL (ref 0.0–1.2)
Total Protein: 6 g/dL — ABNORMAL LOW (ref 6.5–8.1)

## 2023-11-06 LAB — CBC WITH DIFFERENTIAL/PLATELET
Abs Immature Granulocytes: 0.02 K/uL (ref 0.00–0.07)
Basophils Absolute: 0.1 K/uL (ref 0.0–0.1)
Basophils Relative: 1 %
Eosinophils Absolute: 0.4 K/uL (ref 0.0–0.5)
Eosinophils Relative: 5 %
HCT: 27.4 % — ABNORMAL LOW (ref 36.0–46.0)
Hemoglobin: 9.4 g/dL — ABNORMAL LOW (ref 12.0–15.0)
Immature Granulocytes: 0 %
Lymphocytes Relative: 20 %
Lymphs Abs: 1.7 K/uL (ref 0.7–4.0)
MCH: 29.5 pg (ref 26.0–34.0)
MCHC: 34.3 g/dL (ref 30.0–36.0)
MCV: 85.9 fL (ref 80.0–100.0)
Monocytes Absolute: 1.1 K/uL — ABNORMAL HIGH (ref 0.1–1.0)
Monocytes Relative: 13 %
Neutro Abs: 5.3 K/uL (ref 1.7–7.7)
Neutrophils Relative %: 61 %
Platelets: 329 K/uL (ref 150–400)
RBC: 3.19 MIL/uL — ABNORMAL LOW (ref 3.87–5.11)
RDW: 15.3 % (ref 11.5–15.5)
WBC: 8.6 K/uL (ref 4.0–10.5)
nRBC: 0 % (ref 0.0–0.2)

## 2023-11-06 LAB — C-REACTIVE PROTEIN: CRP: 4.1 mg/dL — ABNORMAL HIGH (ref <1.0)

## 2023-11-06 LAB — MAGNESIUM: Magnesium: 1.7 mg/dL (ref 1.7–2.4)

## 2023-11-06 MED ORDER — STERILE WATER FOR INJECTION IV SOLN
INTRAVENOUS | Status: DC
  Filled 2023-11-06 (×2): qty 1000
  Filled 2023-11-06: qty 150
  Filled 2023-11-06 (×4): qty 1000
  Filled 2023-11-06 (×2): qty 150
  Filled 2023-11-06 (×2): qty 1000

## 2023-11-06 MED ORDER — PROCHLORPERAZINE EDISYLATE 10 MG/2ML IJ SOLN
10.0000 mg | Freq: Four times a day (QID) | INTRAMUSCULAR | Status: DC | PRN
  Administered 2023-11-06 – 2023-11-10 (×4): 10 mg via INTRAVENOUS
  Filled 2023-11-06 (×4): qty 2

## 2023-11-06 NOTE — Progress Notes (Addendum)
 PROGRESS NOTE        PATIENT DETAILS Name: [name] Age: 78 y.o. Sex: female Date of Birth: 12/12/1945 Admit Date: 11/04/2023 Admitting Physician Micaela Speaker, MD ERE:Yzmwjwizs [name] GRADE, MD  Brief Summary: Patient is a 78 y.o.  female with history of chron's disease, HTN-who presented with diarrhea/weakness-found to have AKI and subsequently admitted to the hospitalist service.  Significant events: 10/29>> admit to [facility]  Significant studies: 10/28>> CT abdomen/pelvis: Active colitis-transverse/descending colon.  Significant microbiology data: 10/29>> blood culture: No growth  Procedures: None  Consults: GI Nephrology  Subjective: Thinks she does not feel any better-very hard of hearing-no family at bedside.  But no diarrhea overnight.  Stool samples never sent.  Objective: Vitals: Blood pressure (!) 149/69, pulse 72, temperature 97.8 F (36.6 C), temperature source Oral, resp. rate 18, weight 54.4 kg, SpO2 94%.   Exam: Gen Exam:Alert awake-not in any distress HEENT:atraumatic, normocephalic Chest: B/L clear to auscultation anteriorly CVS:S1S2 regular Abdomen:soft non tender, non distended Extremities:no edema Neurology: Non focal Skin: no rash  Pertinent Labs/Radiology:    Latest Ref Rng & Units 11/06/2023    2:11 AM 11/05/2023    5:01 AM 11/04/2023   10:09 PM  CBC  WBC 4.0 - 10.5 K/uL 8.6  17.4  18.4   Hemoglobin 12.0 - 15.0 g/dL 9.4  89.2  88.7   Hematocrit 36.0 - 46.0 % 27.4  34.6  36.1   Platelets 150 - 400 K/uL 329  341  388     Lab Results  Component Value Date   NA 134 (L) 11/06/2023   K 4.3 11/06/2023   CL 103 11/06/2023   CO2 16 (L) 11/06/2023      Assessment/Plan: AKI on CKD stage IIIa AKI likely hemodynamically mediated in the setting of diarrhea/losartan  use.  UA without proteinuria-CT without any hydronephrosis. Creatinine improving with supportive care-diarrhea seems to have slowed  down-losartan  on hold No acute indications for HD Continue IV fluid hydration. Avoid nephrotoxic agents Follow electrolytes.  Metabolic acidosis Secondary to diarrhea/AKI Continue IV fluid with bicarb.  Hyperkalemia Secondary to AKI/losartan  use Resolved with improvement in renal function/IV bicarb-losartan  remains on hold.  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 Await GI recommendations. Skyrizi  on hold  Normocytic anemia No evidence of blood loss-this is likely secondary to acute illness Follow CBC  HTN BP stable without the use of any antihypertensives Losartan  held due to AKI  GERD PPI  GAD  Cymbalta  on hold-Due to AKI  Hard of hearing Has hearing aids at bedside  Code status:   Code Status: Full Code   DVT Prophylaxis: heparin  injection 5,000 Units Start: 11/05/23 0600 SCDs Start: 11/05/23 0114 Place TED hose Start: 11/05/23 0114   Family Communication: Spouse at bedside   Disposition Plan: Status is: Inpatient Remains inpatient appropriate because: Severity of illness   Planned Discharge Destination:Home   Diet: Diet Order             Diet renal with fluid restriction Fluid restriction: Other (see comments); Room service appropriate? Yes; Fluid consistency: Thin  Diet effective now                     Antimicrobial agents: Anti-infectives (From admission, onward)  Start     Dose/Rate Route Frequency Ordered Stop   11/05/23 2200  cefTRIAXone (ROCEPHIN) 2 g in sodium chloride  0.9 % 100 mL IVPB        2 g 200 mL/hr over 30 Minutes Intravenous Every 24 hours 11/05/23 0113 11/11/23 2159   11/05/23 1400  metroNIDAZOLE  (FLAGYL ) IVPB 500 mg        500 mg 100 mL/hr over 60 Minutes Intravenous Every 12 hours 11/05/23 0113 11/12/23 0159   11/05/23 0045  cefTRIAXone (ROCEPHIN) 1 g in  sodium chloride  0.9 % 100 mL IVPB        1 g 200 mL/hr over 30 Minutes Intravenous  Once 11/05/23 0035 11/05/23 0137   11/05/23 0045  metroNIDAZOLE  (FLAGYL ) IVPB 500 mg        500 mg 100 mL/hr over 60 Minutes Intravenous  Once 11/05/23 0035 11/05/23 0137        MEDICATIONS: Scheduled Meds:  diphenoxylate -atropine   2 tablet Oral TID   ferrous sulfate   325 mg Oral QHS   heparin   5,000 Units Subcutaneous Q8H   pantoprazole   40 mg Oral Daily   sodium chloride  flush  3 mL Intravenous Q12H   sodium chloride  flush  3 mL Intravenous Q12H   Continuous Infusions:  cefTRIAXone (ROCEPHIN)  IV 2 g (11/05/23 2124)   metronidazole  500 mg (11/06/23 0245)   sodium bicarbonate 150 mEq in sterile water  1,150 mL infusion     PRN Meds:.acetaminophen  **OR** acetaminophen , albuterol , bismuth subsalicylate, dicyclomine , ondansetron  **OR** ondansetron  (ZOFRAN ) IV, sodium chloride  flush   I have personally reviewed following labs and imaging studies  LABORATORY DATA: CBC: Recent Labs  Lab 10/30/23 1103 11/04/23 2209 11/05/23 0501 11/06/23 0211  WBC 12.3* 18.4* 17.4* 8.6  NEUTROABS 9.1*  --   --  5.3  HGB 11.1* 11.2* 10.7* 9.4*  HCT 34.5* 36.1 34.6* 27.4*  MCV 89.1 91.2 90.1 85.9  PLT 381.0 388 341 329    Basic Metabolic Panel: Recent Labs  Lab 10/30/23 1103 11/04/23 2209 11/04/23 2217 11/05/23 0138 11/05/23 0501 11/06/23 0211  NA 135 128*  --  130* 129* 134*  K 4.4 6.9*  --  5.6* 5.2* 4.3  CL 102 101  --  104 100 103  CO2 23 11*  --  12* 14* 16*  GLUCOSE 111* 102*  --  99 86 100*  BUN 45* 111*  --  112* 111* 116*  CREATININE 1.25* 7.89*  --  7.69* 7.67* 6.96*  CALCIUM  9.3 10.3  --  10.0 11.9* 8.6*  MG 1.6  --  2.1  --   --  1.7    GFR: Estimated Creatinine Clearance: 4.8 mL/min (A) (by C-G formula based on SCr of 6.96 mg/dL (H)).  Liver Function Tests: Recent Labs  Lab 10/30/23 1103 11/04/23 2209 11/05/23 0501 11/06/23 0211  AST 12 24 13* 13*  ALT 10 11 10 11    ALKPHOS 75 97 93 62  BILITOT 0.3 0.3 0.3 0.8  PROT 7.7 7.6 7.3 6.0*  ALBUMIN 3.7 3.6 3.6 2.6*   Recent Labs  Lab 11/04/23 2209  LIPASE 58*   No results for input(s): AMMONIA in the last 168 hours.  Coagulation Profile: No results for input(s): INR, PROTIME in the last 168 hours.  Cardiac Enzymes: No results for input(s): CKTOTAL, CKMB, CKMBINDEX, TROPONINI in the last 168 hours.  BNP (last 3 results) No results for input(s): PROBNP in the last 8760 hours.  Lipid Profile: No results for input(s): CHOL, HDL, LDLCALC, TRIG,  CHOLHDL, LDLDIRECT in the last 72 hours.  Thyroid  Function Tests: No results for input(s): TSH, T4TOTAL, FREET4, T3FREE, THYROIDAB in the last 72 hours.  Anemia Panel: No results for input(s): VITAMINB12, FOLATE, FERRITIN, TIBC, IRON, RETICCTPCT in the last 72 hours.  Urine analysis:    Component Value Date/Time   COLORURINE YELLOW 11/05/2023 1643   APPEARANCEUR HAZY (A) 11/05/2023 1643   LABSPEC 1.015 11/05/2023 1643   PHURINE 5.0 11/05/2023 1643   GLUCOSEU NEGATIVE 11/05/2023 1643   GLUCOSEU NEGATIVE 02/16/2019 1534   HGBUR NEGATIVE 11/05/2023 1643   BILIRUBINUR NEGATIVE 11/05/2023 1643   BILIRUBINUR neg 10/29/2023 1652   KETONESUR NEGATIVE 11/05/2023 1643   PROTEINUR NEGATIVE 11/05/2023 1643   UROBILINOGEN 0.2 10/29/2023 1652   UROBILINOGEN 0.2 02/16/2019 1534   NITRITE NEGATIVE 11/05/2023 1643   LEUKOCYTESUR MODERATE (A) 11/05/2023 1643    Sepsis Labs: Lactic Acid, Venous    Component Value Date/Time   LATICACIDVEN 1.4 05/02/2021 1522    MICROBIOLOGY: Recent Results (from the past 240 hours)  Urine Culture     Status: None   Collection Time: 10/30/23  1:14 PM   Specimen: Urine  Result Value Ref Range Status   MICRO NUMBER: [id number]  Final   SPECIMEN QUALITY: Adequate  Final   Sample Source URINE  Final   STATUS: FINAL  Final   Result:   Final    Mixed genital flora isolated.  These superficial bacteria are not indicative of a urinary tract infection. No further organism identification is warranted on this specimen. If clinically indicated, recollect clean-catch, mid-stream urine and transfer  immediately to Urine Culture Transport Tube.   Blood culture (routine x 2)     Status: None (Preliminary result)   Collection Time: 11/05/23  1:06 AM   Specimen: BLOOD LEFT FOREARM  Result Value Ref Range Status   Specimen Description   Final    BLOOD LEFT FOREARM Performed at [facility], [address] [place], [place] [address]    Special Requests   Final    Blood Culture results may not be optimal due to an inadequate volume of blood received in culture bottles BOTTLES DRAWN AEROBIC AND ANAEROBIC Performed at [facility], [address], [place], [place] [address]    Culture   Final    NO GROWTH 1 DAY Performed at [facility], [address] [place], [place] [address]    Report Status PENDING  Incomplete  Blood culture (routine x 2)     Status: None (Preliminary result)   Collection Time: 11/05/23  9:04 PM   Specimen: BLOOD  Result Value Ref Range Status   Specimen Description BLOOD SITE NOT SPECIFIED  Final   Special Requests   Final    BOTTLES DRAWN AEROBIC AND ANAEROBIC Blood Culture adequate volume   Culture   Final    NO GROWTH < 12 HOURS Performed at [facility], [address] [place], [place] [address]    Report Status PENDING  Incomplete    RADIOLOGY STUDIES/RESULTS: CT ABDOMEN PELVIS WO CONTRAST Result Date: 11/05/2023 EXAM: CT ABDOMEN AND PELVIS WITHOUT CONTRAST 11/05/2023 12:03:40 AM TECHNIQUE: CT of the abdomen and pelvis was performed without the administration of intravenous contrast. Multiplanar reformatted images are provided for review. Automated exposure control, iterative reconstruction, and/or weight-based adjustment of the mA/kV was utilized to reduce the radiation dose to as low as reasonably  achievable. COMPARISON: None available. CLINICAL HISTORY: Diarrhea; new Skyrizi  infusions, diarrhea, abdominal pain.  Diarrhea for 3 weeks, recently started a new medication for Crohn's, GFR 5. Crohn's disease. FINDINGS: LOWER CHEST: Extensive right coronary artery calcification. Global cardiac size within normal limits. LIVER: The liver is unremarkable. GALLBLADDER AND BILE DUCTS: Gallbladder is unremarkable. No biliary ductal dilatation. SPLEEN: No acute abnormality. PANCREAS: No acute abnormality. ADRENAL GLANDS: No acute abnormality. KIDNEYS, URETERS AND BLADDER: No stones in the kidneys or ureters. No hydronephrosis. No perinephric or periureteral stranding. Urinary bladder is unremarkable. GI AND BOWEL: Stomach demonstrates no acute abnormality. The transverse, descending, and rectosigmoid colon demonstrate an ahaustral featureless appearance in keeping with recurrent proctocolitis, compatible with the patient's history of inflammatory bowel disease. There is superimposed mild circumferential bowel wall thickening and pericolonic inflammatory stranding and vascular engorgement involving the transverse and descending colon in keeping with changes of active colitis. No evidence of obstruction or perforation. No free intraperitoneal gas or fluid. PERITONEUM AND RETROPERITONEUM: No ascites. No free air. VASCULATURE: Moderate aortoiliac atherosclerotic calcification. No aortic aneurysm. LYMPH NODES: No lymphadenopathy. REPRODUCTIVE ORGANS: No acute abnormality. BONES AND SOFT TISSUES: Marked thoracolumbar dextroscoliosis, apex right at T9 and lumbar compensatory levoscoliosis with left lateral listhesis centered at L4-L5 is noted with associated degenerative changes, relatively advanced at L4 - S1. No acute bone abnormality. No lytic or blastic bone lesion. No focal soft tissue abnormality. IMPRESSION: 1. Active colitis involving the transverse and descending colon with mild circumferential wall thickening,  pericolonic inflammatory stranding, and vascular engorgement; compatible with Crohns disease history. No obstruction or perforation. No free intraperitoneal gas or fluid. 2. Marked thoracolumbar sigmoid colpocele 3. Extensive right coronary artery calcification. Electronically signed by: Dorethia Molt MD 11/05/2023 12:16 AM EDT RP Workstation: [id number]     LOS: 1 day   Donalda Applebaum, MD  [facility]    To contact the attending provider between 7A-7P or the covering provider during after hours 7P-7A, please log into the web site [email or web address] and access using universal [employer] password for that web site. If you do not have the password, please call the hospital operator.  11/06/2023, 9:19 AM

## 2023-11-06 NOTE — Progress Notes (Signed)
 Chiyeko I Makarewicz is an 78 y.o. female anemia, Crohn's, GERD, HTN, RA, CKD 3b, OA, pancreatic insufficiency who presented 10/28 to ED c/o diarrhea  after recently starting new med for Crohn's disease. Has had more flare-ups on the medication. Diarrhea for about 3 wks. No recent abx. Poor appetite. In ED BP 102/55, hR 80, RR 16-20, temp 97.7. RA 93% sats. Labs showed Na 128, K+ 6.9, CO2 11, BUN 111, creat 7.89, Ca 10.3, AG 16, Mg 2.1.  Alb 3.6. LFTs ok. WBC 18K, Hb 11. UA done 10/22 (1 wk ago) showed trace LE, +protein, otherwise negative. CT abd showed active colitis of the transverse and descending colon. Pt rec'd hyperkalemia temporizing measures, also IV rocephin and flagyl . Pt was admitted. We are asked to see for renal failure.    Abd CT noncon -> KIDNEYS, URETERS AND BLADDER: No stones in the kidneys or ureters. No hydronephrosis. No perinephric or periureteral stranding. Urinary bladder is unremarkable. Kidneys are normal in appearance.  Labs today -> Na 129, K+ 5.2, CO2 14, BUN 111, creat 7.67     Assessment/ Plan: AKI on CKD 3b: b/l creat 1.2- 1.4 from aug-oct 2025, eGFR 38- 41 ml/min. Creat here 7.6 on admit, in the setting of several days of diarrheal illness (hx of Crohns). CT showed colitis desc/ transverse colon. Pt is on ARB. Abd CT showed no obstruction. Pt is confused, but no other indication for RRT. Pt looks dry on exam. Suspect AKI is due to sig vol depletion + ARB effects +/- hypotension (on arrival); still could have ATN. - Would give another liter of isotonic hco3 based fluids. No e/o obstruction and she states she's voiding more than at home. Urine lytes more c/w ATN and fortunately no absolute indication for dialysis.  -Monitor Daily I/Os, Daily weight  -Maintain MAP>65 for optimal renal perfusion.  - Avoid nephrotoxic agents such as IV contrast, NSAIDs, and phosphate containing bowel preps (FLEETS)  Metabolic acidosis: due to diarrhea and AKI, another liter of  bicarb   Hyperkalemia: K+ 6.9 yesterday and today is down to 4.3. Changed diet to renal diet for now.  Crohns' disease HTN: use anything other than ARB/ ACEi/ Diuretics   Subjective: States some loose BM's but much smaller amount than at home. She has an appetite, denies n/v; has not stood yet. Mild sob.   Chemistry and CBC: Creat  Date/Time Value Ref Range Status  11/08/2019 10:48 AM 1.10 (H) 0.60 - 0.93 mg/dL Final    Comment:    For patients >41 years of age, the reference limit for Creatinine is approximately 13% higher for people identified as African-American. SABRA   10/06/2019 10:46 AM 1.17 (H) 0.60 - 0.93 mg/dL Final    Comment:    For patients >34 years of age, the reference limit for Creatinine is approximately 13% higher for people identified as African-American. SABRA   09/16/2019 11:37 AM 1.27 (H) 0.60 - 0.93 mg/dL Final    Comment:    For patients >3 years of age, the reference limit for Creatinine is approximately 13% higher for people identified as African-American. .    Creatinine, Ser  Date/Time Value Ref Range Status  11/06/2023 02:11 AM 6.96 (H) 0.44 - 1.00 mg/dL Final  89/70/7974 94:98 AM 7.67 (H) 0.44 - 1.00 mg/dL Final  89/70/7974 98:61 AM 7.69 (H) 0.44 - 1.00 mg/dL Final  89/71/7974 89:90 PM 7.89 (H) 0.44 - 1.00 mg/dL Final  89/76/7974 88:96 AM 1.25 (H) 0.40 - 1.20 mg/dL Final  91/88/7974  01:59 PM 1.41 (H) 0.44 - 1.00 mg/dL Final  95/78/7974 97:82 PM 2.08 (H) 0.40 - 1.20 mg/dL Final  95/90/7974 91:69 PM 3.10 (H) 0.44 - 1.00 mg/dL Final  98/72/7974 95:74 AM 1.11 (H) 0.44 - 1.00 mg/dL Final  98/73/7974 94:95 AM 0.88 0.44 - 1.00 mg/dL Final  98/74/7974 94:51 AM 1.29 (H) 0.44 - 1.00 mg/dL Final  98/75/7974 96:48 PM 0.99 0.44 - 1.00 mg/dL Final  98/83/7974 89:61 AM 0.96 0.44 - 1.00 mg/dL Final  89/70/7975 95:72 AM 1.28 (H) 0.44 - 1.00 mg/dL Final  89/71/7975 89:96 AM 1.14 (H) 0.44 - 1.00 mg/dL Final  89/72/7975 90:42 PM 1.01 (H) 0.44 - 1.00 mg/dL Final   90/76/7975 88:69 AM 1.14 0.40 - 1.20 mg/dL Final  90/82/7975 92:78 AM 1.17 (H) 0.44 - 1.00 mg/dL Final  95/89/7975 88:69 AM 1.14 0.40 - 1.20 mg/dL Final  98/90/7975 98:99 PM 1.05 (H) 0.44 - 1.00 mg/dL Final  90/86/7976 88:67 AM 1.05 0.40 - 1.20 mg/dL Final  91/97/7976 89:94 AM 1.07 0.40 - 1.20 mg/dL Final  95/73/7976 95:98 PM 1.00 0.44 - 1.00 mg/dL Final  95/73/7976 96:77 PM 1.03 (H) 0.44 - 1.00 mg/dL Final  90/86/7977 88:63 AM 1.25 (H) 0.40 - 1.20 mg/dL Final  90/98/7977 96:53 PM 1.07 0.40 - 1.20 mg/dL Final  97/74/7978 96:72 AM 0.91 0.44 - 1.00 mg/dL Final  97/75/7978 95:72 AM 0.92 0.44 - 1.00 mg/dL Final  97/87/7978 96:79 PM 0.93 0.44 - 1.00 mg/dL Final  97/90/7978 96:65 PM 1.03 0.40 - 1.20 mg/dL Final  87/69/7979 87:99 PM 0.97 0.40 - 1.20 mg/dL Final  92/77/7979 96:80 AM 0.86 0.44 - 1.00 mg/dL Final  92/83/7979 88:61 AM 0.82 0.44 - 1.00 mg/dL Final   Recent Labs  Lab 10/30/23 1103 11/04/23 2209 11/05/23 0138 11/05/23 0501 11/06/23 0211  NA 135 128* 130* 129* 134*  K 4.4 6.9* 5.6* 5.2* 4.3  CL 102 101 104 100 103  CO2 23 11* 12* 14* 16*  GLUCOSE 111* 102* 99 86 100*  BUN 45* 111* 112* 111* 116*  CREATININE 1.25* 7.89* 7.69* 7.67* 6.96*  CALCIUM  9.3 10.3 10.0 11.9* 8.6*   Recent Labs  Lab 10/30/23 1103 11/04/23 2209 11/05/23 0501 11/06/23 0211  WBC 12.3* 18.4* 17.4* 8.6  NEUTROABS 9.1*  --   --  5.3  HGB 11.1* 11.2* 10.7* 9.4*  HCT 34.5* 36.1 34.6* 27.4*  MCV 89.1 91.2 90.1 85.9  PLT 381.0 388 341 329   Liver Function Tests: Recent Labs  Lab 11/04/23 2209 11/05/23 0501 11/06/23 0211  AST 24 13* 13*  ALT 11 10 11   ALKPHOS 97 93 62  BILITOT 0.3 0.3 0.8  PROT 7.6 7.3 6.0*  ALBUMIN 3.6 3.6 2.6*   Recent Labs  Lab 11/04/23 2209  LIPASE 58*   No results for input(s): AMMONIA in the last 168 hours. Cardiac Enzymes: No results for input(s): CKTOTAL, CKMB, CKMBINDEX, TROPONINI in the last 168 hours. Iron Studies: No results for input(s):  IRON, TIBC, TRANSFERRIN, FERRITIN in the last 72 hours. PT/INR: @LABRCNTIP (inr:5)  Xrays/Other Studies: ) Results for orders placed or performed during the hospital encounter of 11/04/23 (from the past 48 hours)  Lipase, blood     Status: Abnormal   Collection Time: 11/04/23 10:09 PM  Result Value Ref Range   Lipase 58 (H) 11 - 51 U/L    Comment: Performed at [facility], [address], [place], [place] [address]  Comprehensive metabolic panel     Status: Abnormal   Collection Time:  11/04/23 10:09 PM  Result Value Ref Range   Sodium 128 (L) 135 - 145 mmol/L   Potassium 6.9 (HH) 3.5 - 5.1 mmol/L    Comment: HEMOLYSIS AT THIS LEVEL MAY AFFECT RESULT Critical Value, Read Back and verified with LANG PARAS RN @ 2255 ON 11/04/2023 BY MTA    Chloride 101 98 - 111 mmol/L   CO2 11 (L) 22 - 32 mmol/L   Glucose, Bld 102 (H) 70 - 99 mg/dL    Comment: Glucose reference range applies only to samples taken after fasting for at least 8 hours.   BUN 111 (H) 8 - 23 mg/dL   Creatinine, Ser 2.10 (H) 0.44 - 1.00 mg/dL   Calcium  10.3 8.9 - 10.3 mg/dL   Total Protein 7.6 6.5 - 8.1 g/dL   Albumin 3.6 3.5 - 5.0 g/dL   AST 24 15 - 41 U/L    Comment: HEMOLYSIS AT THIS LEVEL MAY AFFECT RESULT   ALT 11 0 - 44 U/L   Alkaline Phosphatase 97 38 - 126 U/L   Total Bilirubin 0.3 0.0 - 1.2 mg/dL   GFR, Estimated 5 (L) >60 mL/min    Comment: (NOTE) Calculated using the CKD-EPI Creatinine Equation (2021)    Anion gap 16 (H) 5 - 15    Comment: Performed at [facility], [address] [place], [place] [address]  CBC     Status: Abnormal   Collection Time: 11/04/23 10:09 PM  Result Value Ref Range   WBC 18.4 (H) 4.0 - 10.5 K/uL   RBC 3.96 3.87 - 5.11 MIL/uL   Hemoglobin 11.2 (L) 12.0 - 15.0 g/dL   HCT 63.8 63.9 - 53.9 %   MCV 91.2 80.0 - 100.0 fL   MCH 28.3 26.0 - 34.0 pg   MCHC 31.0 30.0 - 36.0 g/dL   RDW 83.9 (H) 88.4 - 84.4 %   Platelets 388 150 - 400  K/uL   nRBC 0.0 0.0 - 0.2 %    Comment: Performed at [facility], [address] [place], [place] [address]  Magnesium      Status: None   Collection Time: 11/04/23 10:17 PM  Result Value Ref Range   Magnesium  2.1 1.7 - 2.4 mg/dL    Comment: Performed at [facility], [address], [place], [place] [address]  CBG monitoring, ED     Status: Abnormal   Collection Time: 11/05/23 12:28 AM  Result Value Ref Range   Glucose-Capillary 154 (H) 70 - 99 mg/dL    Comment: Glucose reference range applies only to samples taken after fasting for at least 8 hours.   Comment 1 Notify RN   Blood culture (routine x 2)     Status: None (Preliminary result)   Collection Time: 11/05/23  1:06 AM   Specimen: BLOOD LEFT FOREARM  Result Value Ref Range   Specimen Description      BLOOD LEFT FOREARM Performed at [facility], [address], [place], [place] [address]    Special Requests      Blood Culture results may not be optimal due to an inadequate volume of blood received in culture bottles BOTTLES DRAWN AEROBIC AND ANAEROBIC Performed at [facility], [address], [place], [place] [address]    Culture      NO GROWTH 1 DAY Performed at [facility], [address] [place], [place] [address]    Report Status PENDING   Basic metabolic panel  Status: Abnormal   Collection Time: 11/05/23  1:38 AM  Result Value Ref Range   Sodium 130 (L) 135 - 145 mmol/L   Potassium 5.6 (H) 3.5 - 5.1 mmol/L   Chloride 104 98 - 111 mmol/L   CO2 12 (L) 22 - 32 mmol/L   Glucose, Bld 99 70 - 99 mg/dL    Comment: Glucose reference range applies only to samples taken after fasting for at least 8 hours.   BUN 112 (H) 8 - 23 mg/dL   Creatinine, Ser 2.30 (H) 0.44 - 1.00 mg/dL   Calcium  10.0 8.9 - 10.3 mg/dL   GFR, Estimated 5 (L) >60 mL/min    Comment: (NOTE) Calculated using the CKD-EPI Creatinine Equation (2021)    Anion gap 14 5 - 15     Comment: Performed at [facility], [address], [place], [place] [address]  CBC     Status: Abnormal   Collection Time: 11/05/23  5:01 AM  Result Value Ref Range   WBC 17.4 (H) 4.0 - 10.5 K/uL   RBC 3.84 (L) 3.87 - 5.11 MIL/uL   Hemoglobin 10.7 (L) 12.0 - 15.0 g/dL   HCT 65.3 (L) 63.9 - 53.9 %   MCV 90.1 80.0 - 100.0 fL   MCH 27.9 26.0 - 34.0 pg   MCHC 30.9 30.0 - 36.0 g/dL   RDW 84.0 (H) 88.4 - 84.4 %   Platelets 341 150 - 400 K/uL   nRBC 0.0 0.0 - 0.2 %    Comment: Performed at [facility], [address] [place], [place] [address]  Comprehensive metabolic panel     Status: Abnormal   Collection Time: 11/05/23  5:01 AM  Result Value Ref Range   Sodium 129 (L) 135 - 145 mmol/L   Potassium 5.2 (H) 3.5 - 5.1 mmol/L   Chloride 100 98 - 111 mmol/L   CO2 14 (L) 22 - 32 mmol/L   Glucose, Bld 86 70 - 99 mg/dL    Comment: Glucose reference range applies only to samples taken after fasting for at least 8 hours.   BUN 111 (H) 8 - 23 mg/dL   Creatinine, Ser 2.32 (H) 0.44 - 1.00 mg/dL   Calcium  11.9 (H) 8.9 - 10.3 mg/dL   Total Protein 7.3 6.5 - 8.1 g/dL   Albumin 3.6 3.5 - 5.0 g/dL   AST 13 (L) 15 - 41 U/L   ALT 10 0 - 44 U/L   Alkaline Phosphatase 93 38 - 126 U/L   Total Bilirubin 0.3 0.0 - 1.2 mg/dL   GFR, Estimated 5 (L) >60 mL/min    Comment: (NOTE) Calculated using the CKD-EPI Creatinine Equation (2021)    Anion gap 16 (H) 5 - 15    Comment: Performed at [facility], [address] [place], [place] [address]  Creatinine, urine, random     Status: None   Collection Time: 11/05/23  4:42 PM  Result Value Ref Range   Creatinine, Urine 141 mg/dL    Comment: Performed at [facility], [address], [place], [place] [address]  Sodium, urine, random     Status: None   Collection Time: 11/05/23  4:42 PM  Result Value Ref Range   Sodium, Ur 48 mmol/L    Comment: Performed at [facility], [address] [place], [place] [address]  Osmolality, urine     Status: None   Collection Time: 11/05/23  4:42 PM  Result  Value Ref Range   Osmolality, Ur 397 300 - 900 mOsm/kg    Comment: REPEATED TO VERIFY Performed at [facility], [address], [place], [place] [address]   Urinalysis, Routine w reflex microscopic -Urine, Clean Catch     Status: Abnormal   Collection Time: 11/05/23  4:43 PM  Result Value Ref Range   Color, Urine YELLOW YELLOW   APPearance HAZY (A) CLEAR   Specific Gravity, Urine 1.015 1.005 - 1.030   pH 5.0 5.0 - 8.0   Glucose, UA NEGATIVE NEGATIVE mg/dL   Hgb urine dipstick NEGATIVE NEGATIVE   Bilirubin Urine NEGATIVE NEGATIVE   Ketones, ur NEGATIVE NEGATIVE mg/dL   Protein, ur NEGATIVE NEGATIVE mg/dL   Nitrite NEGATIVE NEGATIVE   Leukocytes,Ua MODERATE (A) NEGATIVE   RBC / HPF 0-5 0 - 5 RBC/hpf   WBC, UA 11-20 0 - 5 WBC/hpf   Bacteria, UA NONE SEEN NONE SEEN   Squamous Epithelial / HPF 0-5 0 - 5 /HPF   Mucus PRESENT    Hyaline Casts, UA PRESENT     Comment: Performed at [facility], [address], [place], [place] [address]  Blood culture (routine x 2)     Status: None (Preliminary result)   Collection Time: 11/05/23  9:04 PM   Specimen: BLOOD  Result Value Ref Range   Specimen Description BLOOD SITE NOT SPECIFIED    Special Requests      BOTTLES DRAWN AEROBIC AND ANAEROBIC Blood Culture adequate volume   Culture      NO GROWTH < 12 HOURS Performed at [facility], [address], [place], [place] [address]    Report Status PENDING   Osmolality     Status: Abnormal   Collection Time: 11/05/23  9:04 PM  Result Value Ref Range   Osmolality 319 (H) 275 - 295 mOsm/kg    Comment: Performed at [facility], [address], [place], [place] [address]  CBC with Differential/Platelet     Status: Abnormal   Collection Time: 11/06/23  2:11 AM  Result Value Ref Range   WBC 8.6 4.0 - 10.5 K/uL   RBC 3.19  (L) 3.87 - 5.11 MIL/uL   Hemoglobin 9.4 (L) 12.0 - 15.0 g/dL   HCT 72.5 (L) 63.9 - 53.9 %   MCV 85.9 80.0 - 100.0 fL   MCH 29.5 26.0 - 34.0 pg   MCHC 34.3 30.0 - 36.0 g/dL   RDW 84.6 88.4 - 84.4 %   Platelets 329 150 - 400 K/uL   nRBC 0.0 0.0 - 0.2 %   Neutrophils Relative % 61 %   Neutro Abs 5.3 1.7 - 7.7 K/uL   Lymphocytes Relative 20 %   Lymphs Abs 1.7 0.7 - 4.0 K/uL   Monocytes Relative 13 %   Monocytes Absolute 1.1 (H) 0.1 - 1.0 K/uL   Eosinophils Relative 5 %   Eosinophils Absolute 0.4 0.0 - 0.5 K/uL   Basophils Relative 1 %   Basophils Absolute 0.1 0.0 - 0.1 K/uL   Immature Granulocytes 0 %   Abs Immature Granulocytes 0.02 0.00 - 0.07 K/uL    Comment: Performed at [facility], [address], [place], [place] [address]  Comprehensive metabolic panel with GFR     Status: Abnormal   Collection Time: 11/06/23  2:11 AM  Result Value Ref Range   Sodium 134 (L) 135 - 145 mmol/L   Potassium 4.3 3.5 - 5.1 mmol/L  Chloride 103 98 - 111 mmol/L   CO2 16 (L) 22 - 32 mmol/L   Glucose, Bld 100 (H) 70 - 99 mg/dL    Comment: Glucose reference range applies only to samples taken after fasting for at least 8 hours.   BUN 116 (H) 8 - 23 mg/dL   Creatinine, Ser 3.03 (H) 0.44 - 1.00 mg/dL   Calcium  8.6 (L) 8.9 - 10.3 mg/dL   Total Protein 6.0 (L) 6.5 - 8.1 g/dL   Albumin 2.6 (L) 3.5 - 5.0 g/dL   AST 13 (L) 15 - 41 U/L   ALT 11 0 - 44 U/L   Alkaline Phosphatase 62 38 - 126 U/L   Total Bilirubin 0.8 0.0 - 1.2 mg/dL   GFR, Estimated 6 (L) >60 mL/min    Comment: (NOTE) Calculated using the CKD-EPI Creatinine Equation (2021)    Anion gap 15 5 - 15    Comment: Performed at [facility], [address] [place], [place] [address]  Magnesium      Status: None   Collection Time: 11/06/23  2:11 AM  Result Value Ref Range   Magnesium  1.7 1.7 - 2.4 mg/dL    Comment: Performed at [facility], [address] [place], [place] [address]   CT ABDOMEN PELVIS WO  CONTRAST Result Date: 11/05/2023 EXAM: CT ABDOMEN AND PELVIS WITHOUT CONTRAST 11/05/2023 12:03:40 AM TECHNIQUE: CT of the abdomen and pelvis was performed without the administration of intravenous contrast. Multiplanar reformatted images are provided for review. Automated exposure control, iterative reconstruction, and/or weight-based adjustment of the mA/kV was utilized to reduce the radiation dose to as low as reasonably achievable. COMPARISON: None available. CLINICAL HISTORY: Diarrhea; new Skyrizi  infusions, diarrhea, abdominal pain. Diarrhea for 3 weeks, recently started a new medication for Crohn's, GFR 5. Crohn's disease. FINDINGS: LOWER CHEST: Extensive right coronary artery calcification. Global cardiac size within normal limits. LIVER: The liver is unremarkable. GALLBLADDER AND BILE DUCTS: Gallbladder is unremarkable. No biliary ductal dilatation. SPLEEN: No acute abnormality. PANCREAS: No acute abnormality. ADRENAL GLANDS: No acute abnormality. KIDNEYS, URETERS AND BLADDER: No stones in the kidneys or ureters. No hydronephrosis. No perinephric or periureteral stranding. Urinary bladder is unremarkable. GI AND BOWEL: Stomach demonstrates no acute abnormality. The transverse, descending, and rectosigmoid colon demonstrate an ahaustral featureless appearance in keeping with recurrent proctocolitis, compatible with the patient's history of inflammatory bowel disease. There is superimposed mild circumferential bowel wall thickening and pericolonic inflammatory stranding and vascular engorgement involving the transverse and descending colon in keeping with changes of active colitis. No evidence of obstruction or perforation. No free intraperitoneal gas or fluid. PERITONEUM AND RETROPERITONEUM: No ascites. No free air. VASCULATURE: Moderate aortoiliac atherosclerotic calcification. No aortic aneurysm. LYMPH NODES: No lymphadenopathy. REPRODUCTIVE ORGANS: No acute abnormality. BONES AND SOFT TISSUES: Marked  thoracolumbar dextroscoliosis, apex right at T9 and lumbar compensatory levoscoliosis with left lateral listhesis centered at L4-L5 is noted with associated degenerative changes, relatively advanced at L4 - S1. No acute bone abnormality. No lytic or blastic bone lesion. No focal soft tissue abnormality. IMPRESSION: 1. Active colitis involving the transverse and descending colon with mild circumferential wall thickening, pericolonic inflammatory stranding, and vascular engorgement; compatible with Crohns disease history. No obstruction or perforation. No free intraperitoneal gas or fluid. 2. Marked thoracolumbar sigmoid colpocele 3. Extensive right coronary artery calcification. Electronically signed by: Dorethia Molt MD 11/05/2023 12:16 AM EDT RP Workstation: [id number]    PMH:   Past Medical History:  Diagnosis Date   Allergies  Anemia    Bruises easily    Complication of anesthesia    Crohn's disease (HCC)    GI Dr Miquel in [place]     Deviated septum    GERD (gastroesophageal reflux disease)    History of hiatal hernia    HTN (hypertension)    pcp Dr Evalene Polo  in [place], [place]     Kidney disease, chronic, stage III (GFR 30-59 ml/min) (HCC)    Neuropathy    fingers,periodically   Osteoarthritis    Pancreatic insufficiency    PONV (postoperative nausea and vomiting)    Scoliosis    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   Whooping cough 2015   Whooping cough with pneumonia    2017    PSH:   Past Surgical History:  Procedure Laterality Date   BIOPSY  11/05/2022   Procedure: BIOPSY;  Surgeon: Albertus Gordy HERO, MD;  Location: [facility] ENDOSCOPY;  Service: Gastroenterology;;   CATARACT EXTRACTION, BILATERAL  2018   with IOC implant    COLONOSCOPY     DEBRIDEMENT AND CLOSURE WOUND Right 10/01/2018   Procedure: Excision of right knee wound with placement of ACell and Pravena;  Surgeon: Lowery Estefana RAMAN, DO;  Location: [facility];   Service: Plastics;  Laterality: Right;  30 min   ESOPHAGOGASTRODUODENOSCOPY (EGD) WITH PROPOFOL  N/A 11/05/2022   Procedure: ESOPHAGOGASTRODUODENOSCOPY (EGD) WITH PROPOFOL ;  Surgeon: Albertus Gordy HERO, MD;  Location: [facility] ENDOSCOPY;  Service: Gastroenterology;  Laterality: N/A;   HERNIA REPAIR     knee fracture  Right    NASAL FRACTURE SURGERY     needed b/c she was getting frequent sinus infections    TONSILLECTOMY     TOTAL KNEE ARTHROPLASTY Right 07/28/2018   Procedure: TOTAL KNEE ARTHROPLASTY;  Surgeon: Ernie Cough, MD;  Location: [facility] ORS;  Service: Orthopedics;  Laterality: Right;   TOTAL KNEE ARTHROPLASTY Left 03/02/2019   Procedure: TOTAL KNEE ARTHROPLASTY;  Surgeon: Ernie Cough, MD;  Location: [facility];  Service: Orthopedics;  Laterality: Left;  70 mins   UPPER GASTROINTESTINAL ENDOSCOPY     XI ROBOTIC ASSISTED HIATAL HERNIA REPAIR N/A 01/31/2023   Procedure: XI ROBOTIC ASSISTED HIATAL HERNIA REPAIR WITH MESH;  Surgeon: Lyndel Deward PARAS, MD;  Location: [facility];  Service: General;  Laterality: N/A;    Allergies:  Allergies  Allergen Reactions   Aspirin Other (See Comments)    Intestinal Bleeding   Latex Other (See Comments)    Redness and skin peeling   Other Itching    Nuts-itching in throat  Seeds-stomach issues with chrons  Pt has emphysema    Skyrizi  [Risankizumab ]     Excessive Diarrhea   Sulfa Antibiotics Nausea And Vomiting, Nausea Only, Other (See Comments) and Swelling   Adhesive [Tape] Other (See Comments)    Redness and skin peeling   Ciprofloxacin Nausea And Vomiting   Codeine Nausea And Vomiting   Demerol [Meperidine Hcl] Nausea And Vomiting   Fentanyl  Nausea And Vomiting    Makes me very sick   Lactose Intolerance (Gi) Other (See Comments)    Pt has Crohn's    Prednisone  Other (See Comments)    Upset stomach   Septra [Sulfamethoxazole-Trimethoprim] Nausea Only    Medications:   Prior to Admission medications   Medication Sig Start Date End Date  Taking? Authorizing Provider  azelastine  (ASTELIN ) 0.1 % nasal spray Place 2 sprays into both nostrils 2 (two) times daily. Use in each nostril as directed  06/03/23  Yes Tobie Comp B, MD  budesonide  (ENTOCORT EC ) 3 MG 24 hr capsule TAKE 3 CAPSULES (9 MG TOTAL) BY MOUTH DAILY. 08/07/23  Yes McGreal, Inocente HERO, MD  Calcium  Citrate-Vitamin D (CALCIUM  + D PO) Take 1 tablet by mouth 2 (two) times daily.   Yes [provider]  cetirizine  (ZYRTEC ) 10 MG tablet Take 1 tablet (10 mg total) by mouth daily. 07/24/23  Yes Theophilus Andrews, Tully GRADE, MD  dicyclomine  (BENTYL ) 20 MG tablet Take 1 tablet (20 mg total) by mouth 4 (four) times daily as needed for spasms. 11/21/22  Yes Abran Norleen SAILOR, MD  diphenoxylate -atropine  (LOMOTIL ) 2.5-0.025 MG tablet Take 2 tablets by mouth 3 (three) times daily. TAKE 2 TABLETS BY MOUTH IN THE MORNING ,AT NOON, AND AT BEDTIME AS DIRECTED 07/29/23 07/28/24 Yes McGreal, Inocente HERO, MD  DULoxetine  (CYMBALTA ) 60 MG capsule Take 1 capsule (60 mg total) by mouth 2 (two) times daily. 07/24/23  Yes Theophilus Andrews, Tully GRADE, MD  fluticasone  (FLONASE ) 50 MCG/ACT nasal spray SPRAY 2 SPRAYS INTO EACH NOSTRIL EVERY DAY 08/11/23  Yes Theophilus Andrews, Tully GRADE, MD  losartan  (COZAAR ) 100 MG tablet Take 1 tablet (100 mg total) by mouth daily. 07/24/23  Yes Theophilus Andrews, Tully GRADE, MD  meclizine  (ANTIVERT ) 25 MG tablet Take 25 mg by mouth 3 (three) times daily as needed for dizziness.   Yes [provider]  Multiple Vitamin (MULTIVITAMIN WITH MINERALS) TABS tablet Take 1 tablet by mouth daily.   Yes [provider]  omeprazole  (PRILOSEC) 40 MG capsule Take 1 capsule (40 mg total) by mouth daily. 07/29/23  Yes McGreal, Inocente HERO, MD  ondansetron  (ZOFRAN ) 4 MG tablet Take 1 tablet (4 mg total) by mouth every 8 (eight) hours as needed for nausea or vomiting. 10/13/23  Yes Theophilus Andrews, Tully GRADE, MD  albuterol  (VENTOLIN  HFA) 108 [id number] Base) MCG/ACT inhaler Inhale 2 puffs into the  lungs every 6 (six) hours as needed for wheezing or shortness of breath. 05/27/22   Cobb, Comer GAILS, NP  doxycycline  (VIBRAMYCIN ) 100 MG capsule Take 1 capsule (100 mg total) by mouth 2 (two) times daily. 08/19/23   Nafziger, Darleene, NP  Iron, Ferrous Sulfate , 325 (65 Fe) MG TABS Take 325 mg by mouth at bedtime. Patient not taking: Reported on 11/05/2023 11/04/23   Theophilus Andrews, Tully GRADE, MD  ofloxacin  (FLOXIN ) 0.3 % OTIC solution PLACE 4 DROPS INTO BOTH EARS 2 (TWO) TIMES DAILY FOR 5 DAYS.    [provider]  ondansetron  (ZOFRAN -ODT) 4 MG disintegrating tablet 4mg  ODT q4 hours prn nausea/vomit 04/17/23   Floyd, Dan, DO  SKYRIZI  360 MG/2.4ML SOCT Inject 360 mg into the skin every 8 (eight) weeks. Give approximately on 12/29 or after. Patient not taking: Reported on 11/05/2023 01/05/24   Suzann Inocente HERO, MD    Discontinued Meds:   Medications Discontinued During This Encounter  Medication Reason   lactated ringers  infusion    sodium bicarbonate 150 mEq in dextrose  5 % 1,150 mL infusion    furosemide  (LASIX ) 20 MG tablet Discontinued by provider   mupirocin  ointment (BACTROBAN ) 2 %    valACYclovir  (VALTREX ) 1000 MG tablet    triamcinolone  cream (KENALOG ) 0.1 %    DULoxetine  (CYMBALTA ) DR capsule 60 mg    sodium bicarbonate 150 mEq in sterile water  1,150 mL infusion     Social History:  reports that she quit smoking about 35 years ago. Her smoking use included cigarettes. She started smoking about 65 years  ago. She has a 30 pack-year smoking history. She has never used smokeless tobacco. She reports that she does not currently use alcohol. She reports that she does not use drugs.  Family History:   Family History  Problem Relation Age of Onset   Diabetes Father    Parkinson's disease Father    Colon cancer Other    Asthma Neg Hx    Esophageal cancer Neg Hx    Pancreatic cancer Neg Hx    Liver disease Neg Hx    Stomach cancer Neg Hx     Blood pressure (!) 149/69, pulse  72, temperature 97.8 F (36.6 C), temperature source Oral, resp. rate 18, weight 54.4 kg, SpO2 94%. Physical Exam: Gen elderly lady, pleasant, hard of hearing Sclera anicteric, throat clear and mod dry  No jvd or bruits, neck veins flat Chest clear bilat to bases RRR no MRG Abd soft ntnd no mass or ascites +bs Ext diffuse skin condition, no LE or UE edema, no other edema Neuro is alert, awake, nonfocal, gen'd weakness     Elowyn Raupp, LYNWOOD ORN, MD 11/06/2023, 8:49 AM

## 2023-11-06 NOTE — Progress Notes (Addendum)
 Daily Progress Note  DOA: 11/04/2023 Hospital Day: 3  Cc: Acute on chronic diarrhea, Crohn's colitis  ASSESSMENT    78 year old female with longstanding Crohn's colitis, ascending colon stricture on colonoscopy 07/27/2023.   Admitted with acute on chronic diarrhea and AKI  Patient was initially admitted t to [place] on 11/01/23.she had significant AKI with metabolic acidosis.  She was seen by nephrology and subsequently transferred to [facility] . CT scan showing active colitis involving the transverse and descending colon.  Patient convinced that worsening diarrhea is secondary to Skyrizi  (recently initiated).  Other etiologies of worsening diarrhea are under investigation (inpatient and outpatient).  She also feels diarrhea related to her history of SIBO TODAY>> Patient has not had any diarrhea today.  She is eating and taking her normal dosages of Lomotil  but not doing anything else differently.  Unable to submit any stool samples because she has not had a bowel movement.  ESR and CRP are pending.   History of SIBO Generally takes Augmentin  which she finds helpful  AKI on CKD 3B AKI in setting of diarrhea  /volume depletion . Patient was initially admitted to [facility].  Seen by our inpatient team there yesterday but transferred to [facility] with AKI /metabolic acidosis.  Nephrology is following TODAY: Some improvement in creatinine overnight, GFR only 6.   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   Objective     Recent Labs    11/04/23 2209 11/05/23 0501 11/06/23 0211  WBC 18.4* 17.4* 8.6  HGB 11.2* 10.7* 9.4*  HCT 36.1 34.6* 27.4*  MCV 91.2 90.1 85.9  PLT 388 341 329   No results for input(s): FOLATE, VITAMINB12, FERRITIN, TIBC, IRONPCTSAT in the last 72 hours. Recent Labs    11/05/23 0138 11/05/23 0501 11/06/23 0211  NA 130* 129* 134*  K 5.6* 5.2* 4.3  CL 104 100 103  CO2 12* 14* 16*  GLUCOSE 99 86 100*  BUN 112* 111* 116*  CREATININE 7.69* 7.67* 6.96*  CALCIUM  10.0 11.9* 8.6*   Recent Labs    11/04/23 2209 11/05/23 0501 11/06/23 0211  PROT 7.6 7.3 6.0*  ALBUMIN 3.6 3.6 2.6*  AST 24 13* 13*  ALT 11 10 11   ALKPHOS 97 93 62  BILITOT 0.3 0.3 0.8      Imaging:  CT ABDOMEN PELVIS WO CONTRAST EXAM: CT ABDOMEN AND PELVIS WITHOUT CONTRAST 11/05/2023 12:03:40 AM  TECHNIQUE: CT of the abdomen and pelvis was performed without the administration of intravenous contrast. Multiplanar reformatted images are provided for review. Automated exposure control, iterative reconstruction, and/or weight-based adjustment of the mA/kV was utilized to reduce the radiation dose to as low as reasonably achievable.  COMPARISON: None available.  CLINICAL HISTORY: Diarrhea; new Skyrizi  infusions, diarrhea, abdominal pain. Diarrhea for 3 weeks, recently started a new medication for Crohn's,  GFR 5. Crohn's disease.  FINDINGS:  LOWER CHEST: Extensive right coronary artery calcification. Global cardiac size within normal limits.  LIVER: The liver is unremarkable.  GALLBLADDER AND BILE DUCTS: Gallbladder is unremarkable. No biliary ductal dilatation.  SPLEEN: No acute abnormality.  PANCREAS: No acute abnormality.  ADRENAL GLANDS: No acute abnormality.  KIDNEYS, URETERS AND BLADDER: No  stones in the kidneys or ureters. No hydronephrosis. No perinephric or periureteral stranding. Urinary bladder is unremarkable.  GI AND BOWEL: Stomach demonstrates no acute abnormality. The transverse, descending, and rectosigmoid colon demonstrate an ahaustral featureless appearance in keeping with recurrent proctocolitis, compatible with the patient's history of inflammatory bowel disease. There is superimposed mild circumferential bowel wall thickening and pericolonic inflammatory stranding and vascular engorgement involving the transverse and descending colon in keeping with changes of active colitis. No evidence of obstruction or perforation. No free intraperitoneal gas or fluid.  PERITONEUM AND RETROPERITONEUM: No ascites. No free air.  VASCULATURE: Moderate aortoiliac atherosclerotic calcification. No aortic aneurysm.  LYMPH NODES: No lymphadenopathy.  REPRODUCTIVE ORGANS: No acute abnormality.  BONES AND SOFT TISSUES: Marked thoracolumbar dextroscoliosis, apex right at T9 and lumbar compensatory levoscoliosis with left lateral listhesis centered at L4-L5 is noted with associated degenerative changes, relatively advanced at L4 - S1. No acute bone abnormality. No lytic or blastic bone lesion. No focal soft tissue abnormality.  IMPRESSION: 1. Active colitis involving the transverse and descending colon with mild circumferential wall thickening, pericolonic inflammatory stranding, and vascular engorgement; compatible with Crohns disease history. No obstruction or perforation. No free intraperitoneal gas or fluid. 2. Marked thoracolumbar sigmoid colpocele 3. Extensive right coronary artery calcification.  Electronically signed by: Dorethia Molt MD 11/05/2023 12:16 AM EDT RP Workstation: [id number]     Scheduled inpatient medications:   diphenoxylate -atropine   2 tablet Oral TID   ferrous sulfate   325 mg Oral QHS   heparin   5,000 Units Subcutaneous Q8H   pantoprazole    40 mg Oral Daily   sodium chloride  flush  3 mL Intravenous Q12H   sodium chloride  flush  3 mL Intravenous Q12H   Continuous inpatient infusions:   cefTRIAXone (ROCEPHIN)  IV 2 g (11/05/23 2124)   metronidazole  500 mg (11/06/23 1425)   sodium bicarbonate 150 mEq in sterile water  1,150 mL infusion 125 mL/hr at 11/06/23 1059   PRN inpatient medications: acetaminophen  **OR** acetaminophen , albuterol , bismuth subsalicylate, dicyclomine , ondansetron  **OR** ondansetron  (ZOFRAN ) IV, sodium chloride  flush  Vital signs in last 24 hours: Temp:  [97.5 F (36.4 C)-98.6 F (37 C)] 98.6 F (37 C) (10/30 1614) Pulse Rate:  [72-86] 78 (10/30 1614) Resp:  [16-19] 16 (10/30 1614) BP: (114-153)/(47-83) 151/78 (10/30 1614) SpO2:  [85 %-94 %] 94 % (10/30 1614) Weight:  [53.5 kg-54.4 kg] 53.5 kg (10/30 1204) Last BM Date : 11/05/23  Intake/Output Summary (Last 24 hours) at 11/06/2023 1847 Last data filed at 11/05/2023 2111 Gross per 24 hour  Intake 3 ml  Output --  Net 3 ml    Intake/Output from previous day: 10/29 0701 - 10/30 0700 In: 3 [I.V.:3] Out: -  Intake/Output this shift: No intake/output data recorded.   Physical Exam:  General: Alert female in NAD Heart:  Regular rate and rhythm.  Pulmonary: Normal respiratory effort Abdomen: Soft, nondistended, nontender. Normal bowel sounds. Extremities: No lower extremity edema  Neurologic: Alert and oriented Psych: Pleasant. Cooperative     LOS: 1 day   Vina Dasen ,NP 11/06/2023, 6:47 PM   -------------------------------------------------------------------------------------------  I have taken a history, reviewed the chart and  examined the patient. I performed a substantive portion of this encounter, including complete performance of at least one of the key components, in conjunction with the APP. I agree with the APP's note, impression and recommendations  78 year old female with longstanding Crohn's colitis and reported  history of SIBO previously on chronic Augmentin , admitted for acute worsening of diarrhea complicated by acute kidney injury, with creatinine going from 1 to 7 in two of weeks.  She has also had recent antibiotics for UTIs. The patient feels very strongly that the Skyrizi  was the source of her worsening diarrhea, as it worsened immediately after her first infusion, improved a little bit, then worsened immediately after the second infusion. She was having constant diarrhea leading up to her admission, but her diarrhea has suddenly stopped, as she has not had a bowel movement in over 24 hours. She had a significant leukocytosis which has improved quickly.  Her CT scan did show bowel wall thickening consistent with Crohn's colitis, but infectious colitis also possible.  Etiology of her worsening diarrhea is not clear.  The leukocytosis, colon wall thickening and rapid improvement with empiric antibiotics is suggestive of an infectious colitis, although the duration of her symptoms is atypical for a bacterial infection.  Currently no clear indication to start steroids for possible Crohn's flare given significant improvement in her diarrhea. Will obtain inflammatory markers (CRP, ESR and repeating calprotectin), still awaiting stool sample to rule out infectious causes of diarrhea.  Although the patient seems very certain that the Skyrizi  caused her diarrhea, this is not a known side effect of this medication. The decision to continue Skyrizi  or consider other medications will need to be made between the patient and her primary gastroenterologist, Dr. Suzann.  Hopefully, her renal function will quickly improve with resolution of her diarrhea.  GI will continue to follow.  Brogen Duell E. Stacia, MD [facility]  I spent a total of 40 minutes reviewing the patient's medical record, interviewing and examining the patient, discussing her diagnosis and management of her condition going  forward, and documenting in the medical record

## 2023-11-06 NOTE — Plan of Care (Signed)

## 2023-11-06 NOTE — Plan of Care (Signed)

## 2023-11-06 NOTE — Evaluation (Signed)
 Occupational Therapy Evaluation Patient Details Name: [name] MRN: [id number] DOB: 1945-04-24 Today's Date: 11/06/2023   History of Present Illness   78 y.o. female with medical history significant of Crohn's disease, symptomatic paraesophageal hernia, chronic pancreatic cyst,, Schatzki ring, IBS, GERD, essential hypertension, CKD 3A, rheumatoid arthrits, admitted 10/28 for diarrhea and was found to have AKI/acute metabolic acidosis and hyperkalemia.     Clinical Impressions Patient admitted for the diagnosis above.  PTA she lives at home with her spouse, and remained independent with ADL,iADL, mobility, and continues to drive.  Patient is close to baseline, not needing AD for mobility, supervision for stand grooming and CGA for in room mobility.  OT will follow for deficits and no post acute OT is anticipated.       If plan is discharge home, recommend the following:   Assist for transportation     Functional Status Assessment   Patient has had a recent decline in their functional status and demonstrates the ability to make significant improvements in function in a reasonable and predictable amount of time.     Equipment Recommendations   None recommended by OT     Recommendations for Other Services         Precautions/Restrictions   Precautions Precautions: Fall Restrictions Weight Bearing Restrictions Per Provider Order: No     Mobility Bed Mobility Overal bed mobility: Modified Independent                  Transfers Overall transfer level: Needs assistance Equipment used: None Transfers: Sit to/from Stand, Bed to chair/wheelchair/BSC Sit to Stand: Supervision     Step pivot transfers: Contact guard assist            Balance Overall balance assessment: Needs assistance Sitting-balance support: Feet supported Sitting balance-Leahy Scale: Normal     Standing balance support: No upper extremity supported Standing balance-Leahy  Scale: Fair                             ADL either performed or assessed with clinical judgement   ADL       Grooming: Wash/dry hands;Oral care;Supervision/safety;Standing               Lower Body Dressing: Contact guard assist;Sit to/from stand   Toilet Transfer: Production Manager                   Vision Baseline Vision/History: 1 Wears glasses Patient Visual Report: No change from baseline       Perception Perception: Not tested       Praxis Praxis: Not tested       Pertinent Vitals/Pain Pain Assessment Pain Assessment: No/denies pain     Extremity/Trunk Assessment Upper Extremity Assessment Upper Extremity Assessment: Overall WFL for tasks assessed   Lower Extremity Assessment Lower Extremity Assessment: Defer to PT evaluation   Cervical / Trunk Assessment Cervical / Trunk Assessment: Normal   Communication Communication Communication: No apparent difficulties Factors Affecting Communication: Hearing impaired   Cognition Arousal: Alert Behavior During Therapy: WFL for tasks assessed/performed Cognition: No apparent impairments             OT - Cognition Comments: Some problem solving deficits noted, spouse assisting with cues at times.                 Following commands: Intact       Cueing  General Comments   Cueing Techniques:  Verbal cues   VSS   Exercises     Shoulder Instructions      Home Living Family/patient expects to be discharged to:: Private residence Living Arrangements: Spouse/significant other Available Help at Discharge: Family;Available 24 hours/day Type of Home: House Home Access: Level entry     Home Layout: One level     Bathroom Shower/Tub: Chief Strategy Officer: Standard Bathroom Accessibility: Yes   Home Equipment: Cane - single point;Shower seat;Hand held shower head;Grab bars - tub/shower          Prior  Functioning/Environment Prior Level of Function : Independent/Modified Independent;Driving                    OT Problem List: Impaired balance (sitting and/or standing)   OT Treatment/Interventions: Self-care/ADL training;Therapeutic activities;Balance training      OT Goals(Current goals can be found in the care plan section)   Acute Rehab OT Goals Patient Stated Goal: Return home OT Goal Formulation: With patient Time For Goal Achievement: 11/20/23 Potential to Achieve Goals: Good ADL Goals Pt Will Perform Grooming: Independently;standing Pt Will Perform Lower Body Dressing: Independently;sit to/from stand Pt Will Transfer to Toilet: Independently;ambulating;regular height toilet   OT Frequency:  Min 2X/week    Co-evaluation              AM-PAC OT 6 Clicks Daily Activity     Outcome Measure Help from another person eating meals?: None Help from another person taking care of personal grooming?: A Little Help from another person toileting, which includes using toliet, bedpan, or urinal?: A Little Help from another person bathing (including washing, rinsing, drying)?: A Little Help from another person to put on and taking off regular upper body clothing?: None Help from another person to put on and taking off regular lower body clothing?: A Little 6 Click Score: 20   End of Session Nurse Communication: Mobility status  Activity Tolerance: Patient tolerated treatment well Patient left: in chair;with call bell/phone within reach;with family/visitor present  OT Visit Diagnosis: Unsteadiness on feet (R26.81)                Time: 8799-8771 OT Time Calculation (min): 28 min Charges:  OT General Charges $OT Visit: 1 Visit OT Evaluation $OT Eval Moderate Complexity: 1 Mod OT Treatments $Self Care/Home Management : 8-22 mins  11/06/2023  RP, OTR/L  Acute Rehabilitation Services  Office:  [phone]   [name] 11/06/2023, 12:53 PM

## 2023-11-06 NOTE — Progress Notes (Signed)
 [facility] night cross cover note:   Patient continuing to experience nausea following dose of Zofran .  I subsequently added order for as needed IV Compazine  for refractory nausea/vomiting.    Eva Pore, DO Hospitalist

## 2023-11-07 DIAGNOSIS — K580 Irritable bowel syndrome with diarrhea: Secondary | ICD-10-CM | POA: Diagnosis not present

## 2023-11-07 DIAGNOSIS — I10 Essential (primary) hypertension: Secondary | ICD-10-CM | POA: Diagnosis not present

## 2023-11-07 DIAGNOSIS — K222 Esophageal obstruction: Secondary | ICD-10-CM | POA: Diagnosis not present

## 2023-11-07 DIAGNOSIS — N179 Acute kidney failure, unspecified: Secondary | ICD-10-CM | POA: Diagnosis not present

## 2023-11-07 LAB — RENAL FUNCTION PANEL
Albumin: 2.6 g/dL — ABNORMAL LOW (ref 3.5–5.0)
Anion gap: 17 — ABNORMAL HIGH (ref 5–15)
BUN: 92 mg/dL — ABNORMAL HIGH (ref 8–23)
CO2: 22 mmol/L (ref 22–32)
Calcium: 8.2 mg/dL — ABNORMAL LOW (ref 8.9–10.3)
Chloride: 97 mmol/L — ABNORMAL LOW (ref 98–111)
Creatinine, Ser: 4.47 mg/dL — ABNORMAL HIGH (ref 0.44–1.00)
GFR, Estimated: 10 mL/min — ABNORMAL LOW (ref >60)
Glucose, Bld: 91 mg/dL (ref 70–99)
Phosphorus: 2.9 mg/dL (ref 2.5–4.6)
Potassium: 3.8 mmol/L (ref 3.5–5.1)
Sodium: 136 mmol/L (ref 135–145)

## 2023-11-07 LAB — CBC
HCT: 28.2 % — ABNORMAL LOW (ref 36.0–46.0)
Hemoglobin: 9.7 g/dL — ABNORMAL LOW (ref 12.0–15.0)
MCH: 28.9 pg (ref 26.0–34.0)
MCHC: 34.4 g/dL (ref 30.0–36.0)
MCV: 83.9 fL (ref 80.0–100.0)
Platelets: 336 K/uL (ref 150–400)
RBC: 3.36 MIL/uL — ABNORMAL LOW (ref 3.87–5.11)
RDW: 14.6 % (ref 11.5–15.5)
WBC: 9 K/uL (ref 4.0–10.5)
nRBC: 0 % (ref 0.0–0.2)

## 2023-11-07 MED ORDER — AMLODIPINE BESYLATE 5 MG PO TABS
5.0000 mg | ORAL_TABLET | Freq: Every day | ORAL | Status: DC
  Administered 2023-11-07 – 2023-11-08 (×2): 5 mg via ORAL
  Filled 2023-11-07 (×2): qty 1

## 2023-11-07 NOTE — Evaluation (Signed)
 Physical Therapy Evaluation Patient Details Name: [name] MRN: [id number] DOB: April 01, 1945 Today's Date: 11/07/2023  History of Present Illness  78 y.o. female presents to [facility] 11/04/23 with for diarrhea. Found to have AKI/acute metabolic acidosis and hyperkalemia. PMHx: Crohn's disease, symptomatic paraesophageal hernia, chronic pancreatic cyst, Schatzki ring, IBS, GERD, essential hypertension, CKD 3A, rheumatoid arthrits   Clinical Impression  PTA pt was independent for mobility with no AD. Limited PT eval as pt was feeling nauseous at rest and with movement. Pt was able to perform bed mobility with ModI and sit on the EOB with supervision for safety. Pt did not have a reduction in symptoms and politely declined OOB activity. Anticipate pt is close to baseline based on OT eval yesterday. Acute PT to continue to follow and assess post-acute needs. Pt will have 24/7 assist available upon d/c home.        If plan is discharge home, recommend the following: Help with stairs or ramp for entrance;Assist for transportation   Can travel by private vehicle    Yes    Equipment Recommendations None recommended by PT     Functional Status Assessment Patient has had a recent decline in their functional status and demonstrates the ability to make significant improvements in function in a reasonable and predictable amount of time.     Precautions / Restrictions Precautions Precautions: Fall Recall of Precautions/Restrictions: Intact Restrictions Weight Bearing Restrictions Per Provider Order: No      Mobility  Bed Mobility Overal bed mobility: Modified Independent   Transfers  General transfer comment: deferred 2/2 nausea      Balance Overall balance assessment: Needs assistance Sitting-balance support: Feet supported Sitting balance-Leahy Scale: Normal         Pertinent Vitals/Pain Pain Assessment Pain Assessment: No/denies pain    Home Living Family/patient expects to  be discharged to:: Private residence Living Arrangements: Spouse/significant other Available Help at Discharge: Family;Available 24 hours/day Type of Home: House Home Access: Level entry       Home Layout: One level Home Equipment: Cane - single point;Shower seat;Hand held shower head;Grab bars - tub/shower      Prior Function Prior Level of Function : Independent/Modified Independent;Driving    Mobility Comments: Ind with no AD       Extremity/Trunk Assessment   Upper Extremity Assessment Upper Extremity Assessment: Defer to OT evaluation    Lower Extremity Assessment Lower Extremity Assessment: Overall WFL for tasks assessed    Cervical / Trunk Assessment Cervical / Trunk Assessment: Normal  Communication   Communication Communication: Impaired Factors Affecting Communication: Hearing impaired (has hearing aides)    Cognition Arousal: Alert Behavior During Therapy: WFL for tasks assessed/performed   PT - Cognitive impairments: No apparent impairments    Following commands: Intact       Cueing Cueing Techniques: Verbal cues      PT Assessment Patient needs continued PT services  PT Problem List Decreased activity tolerance;Decreased mobility       PT Treatment Interventions DME instruction;Gait training;Functional mobility training;Therapeutic activities;Therapeutic exercise;Balance training;Neuromuscular re-education;Patient/family education    PT Goals (Current goals can be found in the Care Plan section)  Acute Rehab PT Goals Patient Stated Goal: to feel better and go home PT Goal Formulation: With patient Time For Goal Achievement: 11/21/23 Potential to Achieve Goals: Good    Frequency Min 2X/week        AM-PAC PT 6 Clicks Mobility  Outcome Measure Help needed turning from your back to your side while  in a flat bed without using bedrails?: None Help needed moving from lying on your back to sitting on the side of a flat bed without using  bedrails?: None Help needed moving to and from a bed to a chair (including a wheelchair)?: A Little Help needed standing up from a chair using your arms (e.g., wheelchair or bedside chair)?: A Little Help needed to walk in hospital room?: A Little Help needed climbing 3-5 steps with a railing? : A Little 6 Click Score: 20    End of Session   Activity Tolerance: Other (comment) (limited by symptoms of nausea) Patient left: in bed;with call bell/phone within reach;with bed alarm set Nurse Communication: Mobility status PT Visit Diagnosis: Other abnormalities of gait and mobility (R26.89)    Time: 8396-8380 PT Time Calculation (min) (ACUTE ONLY): 16 min   Charges:   PT Evaluation $PT Eval Low Complexity: 1 Low   PT General Charges $$ ACUTE PT VISIT: 1 Visit        Kate ORN, PT, DPT Secure Chat Preferred  Rehab Office [phone]   Kate BRAVO Wendolyn 11/07/2023, 4:29 PM

## 2023-11-07 NOTE — Plan of Care (Signed)
  Problem: Education: Goal: Knowledge of General Education information will improve Description: Including pain rating scale, medication(s)/side effects and non-pharmacologic comfort measures Outcome: Progressing   Problem: Health Behavior/Discharge Planning: Goal: Ability to manage health-related needs will improve Outcome: Progressing   Problem: Clinical Measurements: Goal: Ability to maintain clinical measurements within normal limits will improve Outcome: Progressing Goal: Will remain free from infection Outcome: Progressing Goal: Diagnostic test results will improve Outcome: Progressing Goal: Respiratory complications will improve Outcome: Progressing   Problem: Activity: Goal: Risk for activity intolerance will decrease Outcome: Progressing   Problem: Coping: Goal: Level of anxiety will decrease Outcome: Progressing

## 2023-11-07 NOTE — Progress Notes (Signed)
 PROGRESS NOTE        PATIENT DETAILS Name: [name] Age: 78 y.o. Sex: female Date of Birth: 1945-08-04 Admit Date: 11/04/2023 Admitting Physician Micaela Speaker, MD ERE:Yzmwjwizs [name] GRADE, MD  Brief Summary: Patient is a 78 y.o.  female with history of chron's disease, HTN-who presented with diarrhea/weakness-found to have AKI and subsequently admitted to the hospitalist service.  Significant events: 10/29>> admit to [facility]  Significant studies: 10/28>> CT abdomen/pelvis: Active colitis-transverse/descending colon.  Significant microbiology data: 10/29>> blood culture: No growth  Procedures: None  Consults: GI Nephrology  Subjective: 1 loose bowel movement overnight-unclear why stool studies were not sent.  No major complaints otherwise.  Objective: Vitals: Blood pressure (!) 166/80, pulse 94, temperature 98.5 F (36.9 C), temperature source Oral, resp. rate 18, weight 55.6 kg, SpO2 92%.   Exam: Gen Exam:Alert awake-not in any distress HEENT:atraumatic, normocephalic Chest: B/L clear to auscultation anteriorly CVS:S1S2 regular Abdomen:soft non tender, non distended Extremities:no edema Neurology: Non focal Skin: no rash  Pertinent Labs/Radiology:    Latest Ref Rng & Units 11/07/2023    3:07 AM 11/06/2023    2:11 AM 11/05/2023    5:01 AM  CBC  WBC 4.0 - 10.5 K/uL 9.0  8.6  17.4   Hemoglobin 12.0 - 15.0 g/dL 9.7  9.4  89.2   Hematocrit 36.0 - 46.0 % 28.2  27.4  34.6   Platelets 150 - 400 K/uL 336  329  341     Lab Results  Component Value Date   NA 136 11/07/2023   K 3.8 11/07/2023   CL 97 (L) 11/07/2023   CO2 22 11/07/2023      Assessment/Plan: AKI on CKD stage IIIa AKI likely hemodynamically mediated in the setting of diarrhea/losartan  use.  UA without proteinuria-CT without any hydronephrosis. Creatinine improving with supportive care  No acute indications for HD Continue IV fluid hydration. Avoid  nephrotoxic agents Follow electrolytes.  Metabolic acidosis Secondary to diarrhea/AKI Bicarb levels have improved after improvement in diarrhea and improvement in renal function-Will cautiously continue with IV bicarb fluid today-suspect this can be discontinued soon.  Hyperkalemia Secondary to AKI/losartan  use Resolved with improvement in renal function/IV bicarb-losartan  remains on hold.  Acute on chronic diarrhea-likely secondary to Crohn's colitis Diarrhea seems to have slowed down-Will discuss with nursing staff to send stool studies if she has loose stools today. On empiric Rocephin/Flagyl /Lomotil  and as needed Bentyl . Await GI recommendations. Skyrizi  on hold  Normocytic anemia No evidence of blood loss-this is likely secondary to acute illness Follow CBC  HTN Losartan  remains on hold due to AKI BP now creeping up-will start low-dose amlodipine and see how she does.  GERD PPI  GAD  Cymbalta  on hold-Due to AKI  Hard of hearing Has hearing aids at bedside  Code status:   Code Status: Full Code   DVT Prophylaxis: heparin  injection 5,000 Units Start: 11/05/23 0600 SCDs Start: 11/05/23 0114 Place TED hose Start: 11/05/23 0114   Family Communication: Spouse at bedside 10/30-none at bedside this morning.   Disposition Plan: Status is: Inpatient Remains inpatient appropriate because: Severity of illness   Planned Discharge Destination:Home   Diet: Diet Order             Diet renal with fluid restriction Fluid restriction: Other (see comments); Room service appropriate? Yes; Fluid consistency: Thin  Diet effective now  Antimicrobial agents: Anti-infectives (From admission, onward)    Start     Dose/Rate Route Frequency Ordered Stop   11/05/23 2200  cefTRIAXone (ROCEPHIN) 2 g in sodium chloride  0.9 % 100 mL IVPB        2 g 200 mL/hr over 30 Minutes Intravenous Every 24 hours 11/05/23 0113 11/11/23 2159   11/05/23 1400   metroNIDAZOLE  (FLAGYL ) IVPB 500 mg        500 mg 100 mL/hr over 60 Minutes Intravenous Every 12 hours 11/05/23 0113 11/12/23 0159   11/05/23 0045  cefTRIAXone (ROCEPHIN) 1 g in sodium chloride  0.9 % 100 mL IVPB        1 g 200 mL/hr over 30 Minutes Intravenous  Once 11/05/23 0035 11/05/23 0137   11/05/23 0045  metroNIDAZOLE  (FLAGYL ) IVPB 500 mg        500 mg 100 mL/hr over 60 Minutes Intravenous  Once 11/05/23 0035 11/05/23 0137        MEDICATIONS: Scheduled Meds:  diphenoxylate -atropine   2 tablet Oral TID   ferrous sulfate   325 mg Oral QHS   heparin   5,000 Units Subcutaneous Q8H   pantoprazole   40 mg Oral Daily   sodium chloride  flush  3 mL Intravenous Q12H   sodium chloride  flush  3 mL Intravenous Q12H   Continuous Infusions:  cefTRIAXone (ROCEPHIN)  IV Stopped (11/06/23 2242)   metronidazole  Stopped (11/07/23 0405)   sodium bicarbonate 150 mEq in sterile water  1,150 mL infusion 125 mL/hr at 11/07/23 0412   PRN Meds:.acetaminophen  **OR** acetaminophen , albuterol , bismuth subsalicylate, dicyclomine , ondansetron  **OR** ondansetron  (ZOFRAN ) IV, prochlorperazine , sodium chloride  flush   I have personally reviewed following labs and imaging studies  LABORATORY DATA: CBC: Recent Labs  Lab 11/04/23 2209 11/05/23 0501 11/06/23 0211 11/07/23 0307  WBC 18.4* 17.4* 8.6 9.0  NEUTROABS  --   --  5.3  --   HGB 11.2* 10.7* 9.4* 9.7*  HCT 36.1 34.6* 27.4* 28.2*  MCV 91.2 90.1 85.9 83.9  PLT 388 341 329 336    Basic Metabolic Panel: Recent Labs  Lab 11/04/23 2209 11/04/23 2217 11/05/23 0138 11/05/23 0501 11/06/23 0211 11/07/23 0307  NA 128*  --  130* 129* 134* 136  K 6.9*  --  5.6* 5.2* 4.3 3.8  CL 101  --  104 100 103 97*  CO2 11*  --  12* 14* 16* 22  GLUCOSE 102*  --  99 86 100* 91  BUN 111*  --  112* 111* 116* 92*  CREATININE 7.89*  --  7.69* 7.67* 6.96* 4.47*  CALCIUM  10.3  --  10.0 11.9* 8.6* 8.2*  MG  --  2.1  --   --  1.7  --   PHOS  --   --   --   --   --   2.9    GFR: Estimated Creatinine Clearance: 8.1 mL/min (A) (by C-G formula based on SCr of 4.47 mg/dL (H)).  Liver Function Tests: Recent Labs  Lab 11/04/23 2209 11/05/23 0501 11/06/23 0211 11/07/23 0307  AST 24 13* 13*  --   ALT 11 10 11   --   ALKPHOS 97 93 62  --   BILITOT 0.3 0.3 0.8  --   PROT 7.6 7.3 6.0*  --   ALBUMIN 3.6 3.6 2.6* 2.6*   Recent Labs  Lab 11/04/23 2209  LIPASE 58*   No results for input(s): AMMONIA in the last 168 hours.  Coagulation Profile: No results for input(s): INR, PROTIME in the last 168  hours.  Cardiac Enzymes: No results for input(s): CKTOTAL, CKMB, CKMBINDEX, TROPONINI in the last 168 hours.  BNP (last 3 results) No results for input(s): PROBNP in the last 8760 hours.  Lipid Profile: No results for input(s): CHOL, HDL, LDLCALC, TRIG, CHOLHDL, LDLDIRECT in the last 72 hours.  Thyroid  Function Tests: No results for input(s): TSH, T4TOTAL, FREET4, T3FREE, THYROIDAB in the last 72 hours.  Anemia Panel: No results for input(s): VITAMINB12, FOLATE, FERRITIN, TIBC, IRON, RETICCTPCT in the last 72 hours.  Urine analysis:    Component Value Date/Time   COLORURINE YELLOW 11/05/2023 1643   APPEARANCEUR HAZY (A) 11/05/2023 1643   LABSPEC 1.015 11/05/2023 1643   PHURINE 5.0 11/05/2023 1643   GLUCOSEU NEGATIVE 11/05/2023 1643   GLUCOSEU NEGATIVE 02/16/2019 1534   HGBUR NEGATIVE 11/05/2023 1643   BILIRUBINUR NEGATIVE 11/05/2023 1643   BILIRUBINUR neg 10/29/2023 1652   KETONESUR NEGATIVE 11/05/2023 1643   PROTEINUR NEGATIVE 11/05/2023 1643   UROBILINOGEN 0.2 10/29/2023 1652   UROBILINOGEN 0.2 02/16/2019 1534   NITRITE NEGATIVE 11/05/2023 1643   LEUKOCYTESUR MODERATE (A) 11/05/2023 1643    Sepsis Labs: Lactic Acid, Venous    Component Value Date/Time   LATICACIDVEN 1.4 05/02/2021 1522    MICROBIOLOGY: Recent Results (from the past 240 hours)  Urine Culture     Status: None    Collection Time: 10/30/23  1:14 PM   Specimen: Urine  Result Value Ref Range Status   MICRO NUMBER: [id number]  Final   SPECIMEN QUALITY: Adequate  Final   Sample Source URINE  Final   STATUS: FINAL  Final   Result:   Final    Mixed genital flora isolated. These superficial bacteria are not indicative of a urinary tract infection. No further organism identification is warranted on this specimen. If clinically indicated, recollect clean-catch, mid-stream urine and transfer  immediately to Urine Culture Transport Tube.   Blood culture (routine x 2)     Status: None (Preliminary result)   Collection Time: 11/05/23  1:06 AM   Specimen: BLOOD LEFT FOREARM  Result Value Ref Range Status   Specimen Description   Final    BLOOD LEFT FOREARM Performed at [facility], [address] [place], [place] [address]    Special Requests   Final    Blood Culture results may not be optimal due to an inadequate volume of blood received in culture bottles BOTTLES DRAWN AEROBIC AND ANAEROBIC Performed at [facility], [address], [place], [place] [address]    Culture   Final    NO GROWTH 2 DAYS Performed at [facility], [address], [place], [place] [address]    Report Status PENDING  Incomplete  Blood culture (routine x 2)     Status: None (Preliminary result)   Collection Time: 11/05/23  9:04 PM   Specimen: BLOOD  Result Value Ref Range Status   Specimen Description BLOOD SITE NOT SPECIFIED  Final   Special Requests   Final    BOTTLES DRAWN AEROBIC AND ANAEROBIC Blood Culture adequate volume   Culture   Final    NO GROWTH 2 DAYS Performed at [facility], [address], [place], [place] [address]    Report Status PENDING  Incomplete    RADIOLOGY STUDIES/RESULTS: No results found.    LOS: 2 days   Donalda Applebaum, MD  [facility]    To contact the attending provider between 7A-7P or the covering provider during after hours 7P-7A,  please log into the web site [email or web address] and access using universal [place] password for that web site. If you do not have the password, please call the hospital operator.  11/07/2023, 10:44 AM

## 2023-11-07 NOTE — Progress Notes (Addendum)
 Progress Note  Primary GI: Dr. Suzann  LOS: 2 days   Chief Complaint: Acute on chronic diarrhea   Subjective   Patient states she feels lousy this morning.  States she had a liquid bowel movement last night but they were unable to test it since she flushed it.  Denies nausea but states she feels like she may be nauseous later today.  Reports some generalized abdominal discomfort.  Last regimen she ate was yesterday's breakfast as she has not had much of an appetite   Objective   Vital signs in last 24 hours: Temp:  [98.4 F (36.9 C)-98.9 F (37.2 C)] 98.5 F (36.9 C) (10/31 0750) Pulse Rate:  [78-94] 94 (10/31 0400) Resp:  [16-18] 18 (10/31 0400) BP: (151-173)/(74-83) 166/80 (10/31 0750) SpO2:  [92 %-94 %] 92 % (10/31 0400) Weight:  [53.5 kg-55.6 kg] 55.6 kg (10/31 0500) Last BM Date : 11/05/23 Last BM recorded by nurses in past 5 days No data recorded  General:   female in no acute distress Heart:  Regular rate and rhythm; no murmurs Pulm: Clear anteriorly; no wheezing Abdomen: soft, nondistended, normal bowel sounds in all quadrants. Nontender without guarding. No organomegaly appreciated. Extremities:  No edema Neurologic:  Alert and  oriented x4;  No focal deficits.  Psych:  Cooperative. Normal mood and affect.  Intake/Output from previous day: 10/30 0701 - 10/31 0700 In: 2318.6 [I.V.:1719.7; IV Piggyback:598.9] Out: -  Intake/Output this shift: No intake/output data recorded.  Studies/Results: No results found.  Lab Results: Recent Labs    11/05/23 0501 11/06/23 0211 11/07/23 0307  WBC 17.4* 8.6 9.0  HGB 10.7* 9.4* 9.7*  HCT 34.6* 27.4* 28.2*  PLT 341 329 336   BMET Recent Labs    11/05/23 0501 11/06/23 0211 11/07/23 0307  NA 129* 134* 136  K 5.2* 4.3 3.8  CL 100 103 97*  CO2 14* 16* 22  GLUCOSE 86 100* 91  BUN 111* 116* 92*  CREATININE 7.67* 6.96* 4.47*  CALCIUM  11.9* 8.6* 8.2*   LFT Recent Labs    11/06/23 0211 11/07/23 0307   PROT 6.0*  --   ALBUMIN 2.6* 2.6*  AST 13*  --   ALT 11  --   ALKPHOS 62  --   BILITOT 0.8  --    PT/INR No results for input(s): LABPROT, INR in the last 72 hours.   Scheduled Meds:  diphenoxylate -atropine   2 tablet Oral TID   ferrous sulfate   325 mg Oral QHS   heparin   5,000 Units Subcutaneous Q8H   pantoprazole   40 mg Oral Daily   sodium chloride  flush  3 mL Intravenous Q12H   sodium chloride  flush  3 mL Intravenous Q12H   Continuous Infusions:  cefTRIAXone (ROCEPHIN)  IV Stopped (11/06/23 2242)   metronidazole  Stopped (11/07/23 0405)   sodium bicarbonate 150 mEq in sterile water  1,150 mL infusion 125 mL/hr at 11/07/23 0412      Patient profile:   78 y.o. 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   Admitted with acute on chronic diarrhea and AKI  Patient was initially admitted t to [facility] on 11/01/23.she had significant AKI with metabolic acidosis.  She was seen by nephrology and subsequently transferred to [facility] . CT scan showing active colitis involving the transverse and descending colon.  Patient convinced that worsening diarrhea is secondary to Skyrizi  (recently initiated).  Other etiologies of worsening diarrhea are under investigation (inpatient and outpatient).  She also feels diarrhea related to her history of SIBO  TODAY: 1 liquid stool overnight that was not tested, this has been her  first stool since admission.  Infectious stool studies still have yet to be collected.  ESR 30/CRP 4.1.  On Flagyl  and Rocephin.  With improvement in diarrhea while being on antibiotics could be indicative of infectious etiology.  Still possible she is having a Crohn's flare but her improvement while antibiotics is reassuring  History of SIBO Generally takes Augmentin  which she finds helpful   AKI on CKD 3B AKI in setting of diarrhea  /volume depletion . Patient was initially admitted to [facility].  Seen by our inpatient team there yesterday but transferred to [facility] with AKI /metabolic acidosis.  Nephrology is following TODAY: Improving creatinine and GFR (4.47/10)   Large paraesophageal hiatal hernia status post robotic surgery January 2025   Rheumatoid arthritis   Hypertension   Plan:   - Holding off on steroids since diarrhea is improving on antibiotics - Await stool studies if she can submit a sample - Continue to monitor  Bayley CHRISTELLA Blower  11/07/2023, 10:36 AM   ---------------------------------------------------------------------------------------------------------------------  I agree with the APP's note, impression and recommendations.    Patient's diarrhea remains significantly improved.  Renal function improving. Etiology of patient's diarrhea exacerbation unclear, but the rapid improvement with antibiotics suggest infectious. No role for steroids at the moment.  Continue antibiotics for now Still need to get stool samples for infectious evaluation and repeat calprotectin.   Kerianna Rawlinson E. Stacia, MD [facility]

## 2023-11-07 NOTE — Progress Notes (Signed)
 [name] is an 78 y.o. female anemia, Crohn's, GERD, HTN, RA, CKD 3b, OA, pancreatic insufficiency who presented 10/28 to ED c/o diarrhea  after recently starting new med for Crohn's disease. Has had more flare-ups on the medication. Diarrhea for about 3 wks. No recent abx. Poor appetite. In ED BP 102/55, hR 80, RR 16-20, temp 97.7. RA 93% sats. Labs showed Na 128, K+ 6.9, CO2 11, BUN 111, creat 7.89, Ca 10.3, AG 16, Mg 2.1.  Alb 3.6. LFTs ok. WBC 18K, Hb 11. UA done 10/22 (1 wk ago) showed trace LE, +protein, otherwise negative. CT abd showed active colitis of the transverse and descending colon. Pt rec'd hyperkalemia temporizing measures, also IV rocephin and flagyl . Pt was admitted. We are asked to see for renal failure.    Abd CT noncon -> KIDNEYS, URETERS AND BLADDER: No stones in the kidneys or ureters. No hydronephrosis. No perinephric or periureteral stranding. Urinary bladder is unremarkable. Kidneys are normal in appearance.  Labs today -> Na 129, K+ 5.2, CO2 14, BUN 111, creat 7.67     Assessment/ Plan: AKI on CKD 3b: b/l creat 1.2- 1.4 from aug-oct 2025, eGFR 38- 41 ml/min. Creat here 7.6 on admit, in the setting of several days of diarrheal illness (hx of Crohns). CT showed colitis desc/ transverse colon. Pt is on ARB. Abd CT showed no obstruction. Pt is confused, but no other indication for RRT. Pt looks dry on exam. Suspect AKI is due to sig vol depletion + ARB effects +/- hypotension (on arrival); still could have ATN. - Will need additional isotonic hco3 based fluids with ongoing diarrhea; fortunately renal function is improving and more c/w prerenal azotemia (possibly had been in early ATN) at this point. No e/o obstruction and she states she's voiding more than at home. Urine lytes were more c/w ATN and fortunately no absolute indication for dialysis.  -Monitor Daily I/Os, Daily weight  -Maintain MAP>65 for optimal renal perfusion.  - Avoid nephrotoxic agents such as IV  contrast, NSAIDs, and phosphate containing bowel preps (FLEETS)  Metabolic acidosis: due to diarrhea and AKI, continue isotonic bicarb for another liter Hyperkalemia: K+ better now (likely with improving renal function and the HCO3).  Crohns' disease HTN: use anything other than ARB/ ACEi/ Diuretics   Subjective: Frequent loose BM's + nausea.  Mild sob.   Chemistry and CBC: Creat  Date/Time Value Ref Range Status  11/08/2019 10:48 AM 1.10 (H) 0.60 - 0.93 mg/dL Final    Comment:    For patients >95 years of age, the reference limit for Creatinine is approximately 13% higher for people identified as African-American. SABRA   10/06/2019 10:46 AM 1.17 (H) 0.60 - 0.93 mg/dL Final    Comment:    For patients >60 years of age, the reference limit for Creatinine is approximately 13% higher for people identified as African-American. SABRA   09/16/2019 11:37 AM 1.27 (H) 0.60 - 0.93 mg/dL Final    Comment:    For patients >11 years of age, the reference limit for Creatinine is approximately 13% higher for people identified as African-American. .    Creatinine, Ser  Date/Time Value Ref Range Status  11/07/2023 03:07 AM 4.47 (H) 0.44 - 1.00 mg/dL Final  89/69/7974 97:88 AM 6.96 (H) 0.44 - 1.00 mg/dL Final  89/70/7974 94:98 AM 7.67 (H) 0.44 - 1.00 mg/dL Final  89/70/7974 98:61 AM 7.69 (H) 0.44 - 1.00 mg/dL Final  89/71/7974 89:90 PM 7.89 (H) 0.44 - 1.00 mg/dL Final  10/30/2023 11:03 AM 1.25 (H) 0.40 - 1.20 mg/dL Final  91/88/7974 98:40 PM 1.41 (H) 0.44 - 1.00 mg/dL Final  95/78/7974 97:82 PM 2.08 (H) 0.40 - 1.20 mg/dL Final  95/90/7974 91:69 PM 3.10 (H) 0.44 - 1.00 mg/dL Final  98/72/7974 95:74 AM 1.11 (H) 0.44 - 1.00 mg/dL Final  98/73/7974 94:95 AM 0.88 0.44 - 1.00 mg/dL Final  98/74/7974 94:51 AM 1.29 (H) 0.44 - 1.00 mg/dL Final  98/75/7974 96:48 PM 0.99 0.44 - 1.00 mg/dL Final  98/83/7974 89:61 AM 0.96 0.44 - 1.00 mg/dL Final  89/70/7975 95:72 AM 1.28 (H) 0.44 - 1.00 mg/dL Final   89/71/7975 89:96 AM 1.14 (H) 0.44 - 1.00 mg/dL Final  89/72/7975 90:42 PM 1.01 (H) 0.44 - 1.00 mg/dL Final  90/76/7975 88:69 AM 1.14 0.40 - 1.20 mg/dL Final  90/82/7975 92:78 AM 1.17 (H) 0.44 - 1.00 mg/dL Final  95/89/7975 88:69 AM 1.14 0.40 - 1.20 mg/dL Final  98/90/7975 98:99 PM 1.05 (H) 0.44 - 1.00 mg/dL Final  90/86/7976 88:67 AM 1.05 0.40 - 1.20 mg/dL Final  91/97/7976 89:94 AM 1.07 0.40 - 1.20 mg/dL Final  95/73/7976 95:98 PM 1.00 0.44 - 1.00 mg/dL Final  95/73/7976 96:77 PM 1.03 (H) 0.44 - 1.00 mg/dL Final  90/86/7977 88:63 AM 1.25 (H) 0.40 - 1.20 mg/dL Final  90/98/7977 96:53 PM 1.07 0.40 - 1.20 mg/dL Final  97/74/7978 96:72 AM 0.91 0.44 - 1.00 mg/dL Final  97/75/7978 95:72 AM 0.92 0.44 - 1.00 mg/dL Final  97/87/7978 96:79 PM 0.93 0.44 - 1.00 mg/dL Final  97/90/7978 96:65 PM 1.03 0.40 - 1.20 mg/dL Final  87/69/7979 87:99 PM 0.97 0.40 - 1.20 mg/dL Final  92/77/7979 96:80 AM 0.86 0.44 - 1.00 mg/dL Final  92/83/7979 88:61 AM 0.82 0.44 - 1.00 mg/dL Final   Recent Labs  Lab 11/04/23 2209 11/05/23 0138 11/05/23 0501 11/06/23 0211 11/07/23 0307  NA 128* 130* 129* 134* 136  K 6.9* 5.6* 5.2* 4.3 3.8  CL 101 104 100 103 97*  CO2 11* 12* 14* 16* 22  GLUCOSE 102* 99 86 100* 91  BUN 111* 112* 111* 116* 92*  CREATININE 7.89* 7.69* 7.67* 6.96* 4.47*  CALCIUM  10.3 10.0 11.9* 8.6* 8.2*  PHOS  --   --   --   --  2.9   Recent Labs  Lab 11/04/23 2209 11/05/23 0501 11/06/23 0211 11/07/23 0307  WBC 18.4* 17.4* 8.6 9.0  NEUTROABS  --   --  5.3  --   HGB 11.2* 10.7* 9.4* 9.7*  HCT 36.1 34.6* 27.4* 28.2*  MCV 91.2 90.1 85.9 83.9  PLT 388 341 329 336   Liver Function Tests: Recent Labs  Lab 11/04/23 2209 11/05/23 0501 11/06/23 0211 11/07/23 0307  AST 24 13* 13*  --   ALT 11 10 11   --   ALKPHOS 97 93 62  --   BILITOT 0.3 0.3 0.8  --   PROT 7.6 7.3 6.0*  --   ALBUMIN 3.6 3.6 2.6* 2.6*   Recent Labs  Lab 11/04/23 2209  LIPASE 58*   No results for input(s): AMMONIA  in the last 168 hours. Cardiac Enzymes: No results for input(s): CKTOTAL, CKMB, CKMBINDEX, TROPONINI in the last 168 hours. Iron Studies: No results for input(s): IRON, TIBC, TRANSFERRIN, FERRITIN in the last 72 hours. PT/INR: @LABRCNTIP (inr:5)  Xrays/Other Studies: ) Results for orders placed or performed during the hospital encounter of 11/04/23 (from the past 48 hours)  Creatinine, urine, random     Status: None   Collection Time:  11/05/23  4:42 PM  Result Value Ref Range   Creatinine, Urine 141 mg/dL    Comment: Performed at [facility], [address], [place], [place] [address]  Sodium, urine, random     Status: None   Collection Time: 11/05/23  4:42 PM  Result Value Ref Range   Sodium, Ur 48 mmol/L    Comment: Performed at [facility], [address], [place], [place] [address]  Osmolality, urine     Status: None   Collection Time: 11/05/23  4:42 PM  Result Value Ref Range   Osmolality, Ur 397 300 - 900 mOsm/kg    Comment: REPEATED TO VERIFY Performed at [facility], [address] [place], [place] [address]   Urinalysis, Routine w reflex microscopic -Urine, Clean Catch     Status: Abnormal   Collection Time: 11/05/23  4:43 PM  Result Value Ref Range   Color, Urine YELLOW YELLOW   APPearance HAZY (A) CLEAR   Specific Gravity, Urine 1.015 1.005 - 1.030   pH 5.0 5.0 - 8.0   Glucose, UA NEGATIVE NEGATIVE mg/dL   Hgb urine dipstick NEGATIVE NEGATIVE   Bilirubin Urine NEGATIVE NEGATIVE   Ketones, ur NEGATIVE NEGATIVE mg/dL   Protein, ur NEGATIVE NEGATIVE mg/dL   Nitrite NEGATIVE NEGATIVE   Leukocytes,Ua MODERATE (A) NEGATIVE   RBC / HPF 0-5 0 - 5 RBC/hpf   WBC, UA 11-20 0 - 5 WBC/hpf   Bacteria, UA NONE SEEN NONE SEEN   Squamous Epithelial / HPF 0-5 0 - 5 /HPF   Mucus PRESENT    Hyaline Casts, UA PRESENT     Comment: Performed at [facility], [address] [place], [place] [address]   Blood culture (routine x 2)     Status: None (Preliminary result)   Collection Time: 11/05/23  9:04 PM   Specimen: BLOOD  Result Value Ref Range   Specimen Description BLOOD SITE NOT SPECIFIED    Special Requests      BOTTLES DRAWN AEROBIC AND ANAEROBIC Blood Culture adequate volume   Culture      NO GROWTH < 12 HOURS Performed at [facility], [address], [place], [place] [address]    Report Status PENDING   Osmolality     Status: Abnormal   Collection Time: 11/05/23  9:04 PM  Result Value Ref Range   Osmolality 319 (H) 275 - 295 mOsm/kg    Comment: Performed at [facility], [address] [place], [place] [address]  CBC with Differential/Platelet     Status: Abnormal   Collection Time: 11/06/23  2:11 AM  Result Value Ref Range   WBC 8.6 4.0 - 10.5 K/uL   RBC 3.19 (L) 3.87 - 5.11 MIL/uL   Hemoglobin 9.4 (L) 12.0 - 15.0 g/dL   HCT 72.5 (L) 63.9 - 53.9 %   MCV 85.9 80.0 - 100.0 fL   MCH 29.5 26.0 - 34.0 pg   MCHC 34.3 30.0 - 36.0 g/dL   RDW 84.6 88.4 - 84.4 %   Platelets 329 150 - 400 K/uL   nRBC 0.0 0.0 - 0.2 %   Neutrophils Relative % 61 %   Neutro Abs 5.3 1.7 - 7.7 K/uL   Lymphocytes Relative 20 %   Lymphs Abs 1.7 0.7 - 4.0 K/uL   Monocytes Relative 13 %   Monocytes Absolute 1.1 (H) 0.1 - 1.0 K/uL   Eosinophils Relative 5 %   Eosinophils Absolute  0.4 0.0 - 0.5 K/uL   Basophils Relative 1 %   Basophils Absolute 0.1 0.0 - 0.1 K/uL   Immature Granulocytes 0 %   Abs Immature Granulocytes 0.02 0.00 - 0.07 K/uL    Comment: Performed at [facility], [address], [place], [place] [address]  Comprehensive metabolic panel with GFR     Status: Abnormal   Collection Time: 11/06/23  2:11 AM  Result Value Ref Range   Sodium 134 (L) 135 - 145 mmol/L   Potassium 4.3 3.5 - 5.1 mmol/L   Chloride 103 98 - 111 mmol/L   CO2 16 (L) 22 - 32 mmol/L   Glucose, Bld 100 (H) 70 - 99 mg/dL    Comment: Glucose reference range applies only to samples taken after  fasting for at least 8 hours.   BUN 116 (H) 8 - 23 mg/dL   Creatinine, Ser 3.03 (H) 0.44 - 1.00 mg/dL   Calcium  8.6 (L) 8.9 - 10.3 mg/dL   Total Protein 6.0 (L) 6.5 - 8.1 g/dL   Albumin 2.6 (L) 3.5 - 5.0 g/dL   AST 13 (L) 15 - 41 U/L   ALT 11 0 - 44 U/L   Alkaline Phosphatase 62 38 - 126 U/L   Total Bilirubin 0.8 0.0 - 1.2 mg/dL   GFR, Estimated 6 (L) >60 mL/min    Comment: (NOTE) Calculated using the CKD-EPI Creatinine Equation (2021)    Anion gap 15 5 - 15    Comment: Performed at [facility], [address], [place], [place] [address]  Magnesium      Status: None   Collection Time: 11/06/23  2:11 AM  Result Value Ref Range   Magnesium  1.7 1.7 - 2.4 mg/dL    Comment: Performed at [facility], [address], [place], [place] [address]  C-reactive protein     Status: Abnormal   Collection Time: 11/06/23  6:39 PM  Result Value Ref Range   CRP 4.1 (H) <1.0 mg/dL    Comment: Performed at [facility], [address], [place], [place] [address]  Sedimentation rate     Status: Abnormal   Collection Time: 11/06/23  6:39 PM  Result Value Ref Range   Sed Rate 30 (H) 0 - 22 mm/hr    Comment: Performed at [facility], [address], [place], [place] [address]  CBC     Status: Abnormal   Collection Time: 11/07/23  3:07 AM  Result Value Ref Range   WBC 9.0 4.0 - 10.5 K/uL   RBC 3.36 (L) 3.87 - 5.11 MIL/uL   Hemoglobin 9.7 (L) 12.0 - 15.0 g/dL   HCT 71.7 (L) 63.9 - 53.9 %   MCV 83.9 80.0 - 100.0 fL   MCH 28.9 26.0 - 34.0 pg   MCHC 34.4 30.0 - 36.0 g/dL   RDW 85.3 88.4 - 84.4 %   Platelets 336 150 - 400 K/uL   nRBC 0.0 0.0 - 0.2 %    Comment: Performed at [facility], [address] [place], [place] [address]  Renal function panel     Status: Abnormal   Collection Time: 11/07/23  3:07 AM  Result Value Ref Range   Sodium 136 135 - 145 mmol/L   Potassium 3.8 3.5 - 5.1 mmol/L   Chloride 97 (L) 98 - 111 mmol/L   CO2 22 22 - 32 mmol/L   Glucose,  Bld 91 70 - 99 mg/dL  Comment: Glucose reference range applies only to samples taken after fasting for at least 8 hours.   BUN 92 (H) 8 - 23 mg/dL   Creatinine, Ser 5.52 (H) 0.44 - 1.00 mg/dL   Calcium  8.2 (L) 8.9 - 10.3 mg/dL   Phosphorus 2.9 2.5 - 4.6 mg/dL   Albumin 2.6 (L) 3.5 - 5.0 g/dL   GFR, Estimated 10 (L) >60 mL/min    Comment: (NOTE) Calculated using the CKD-EPI Creatinine Equation (2021)    Anion gap 17 (H) 5 - 15    Comment: Performed at [facility], [address], [place], [place] [address]   No results found.   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   Whooping cough 2015   Whooping cough with pneumonia    2017    PSH:   Past Surgical History:  Procedure Laterality Date   BIOPSY  11/05/2022   Procedure: BIOPSY;  Surgeon: Albertus Gordy HERO, MD;  Location: [facility] ENDOSCOPY;  Service: Gastroenterology;;   CATARACT EXTRACTION, BILATERAL  2018   with IOC implant    COLONOSCOPY     DEBRIDEMENT AND CLOSURE WOUND Right 10/01/2018   Procedure: Excision of right knee wound with placement of ACell and Pravena;  Surgeon: Lowery Estefana RAMAN, DO;  Location: [facility];  Service: Plastics;  Laterality: Right;  30 min   ESOPHAGOGASTRODUODENOSCOPY (EGD) WITH PROPOFOL  N/A 11/05/2022   Procedure: ESOPHAGOGASTRODUODENOSCOPY (EGD) WITH PROPOFOL ;  Surgeon: Albertus Gordy HERO, MD;  Location: [facility] ENDOSCOPY;   Service: Gastroenterology;  Laterality: N/A;   HERNIA REPAIR     knee fracture  Right    NASAL FRACTURE SURGERY     needed b/c she was getting frequent sinus infections    TONSILLECTOMY     TOTAL KNEE ARTHROPLASTY Right 07/28/2018   Procedure: TOTAL KNEE ARTHROPLASTY;  Surgeon: Ernie Cough, MD;  Location: [facility];  Service: Orthopedics;  Laterality: Right;   TOTAL KNEE ARTHROPLASTY Left 03/02/2019   Procedure: TOTAL KNEE ARTHROPLASTY;  Surgeon: Ernie Cough, MD;  Location: [facility];  Service: Orthopedics;  Laterality: Left;  70 mins   UPPER GASTROINTESTINAL ENDOSCOPY     XI ROBOTIC ASSISTED HIATAL HERNIA REPAIR N/A 01/31/2023   Procedure: XI ROBOTIC ASSISTED HIATAL HERNIA REPAIR WITH MESH;  Surgeon: Lyndel Deward PARAS, MD;  Location: [facility];  Service: General;  Laterality: N/A;    Allergies:  Allergies  Allergen Reactions   Aspirin Other (See Comments)    Intestinal Bleeding   Latex Other (See Comments)    Redness and skin peeling   Other Itching    Nuts-itching in throat  Seeds-stomach issues with chrons  Pt has emphysema    Skyrizi  [Risankizumab ]     Excessive Diarrhea   Sulfa Antibiotics Nausea And Vomiting, Nausea  Only, Other (See Comments) and Swelling   Adhesive [Tape] Other (See Comments)    Redness and skin peeling   Ciprofloxacin Nausea And Vomiting   Codeine Nausea And Vomiting   Demerol [Meperidine Hcl] Nausea And Vomiting   Fentanyl  Nausea And Vomiting    Makes me very sick   Lactose Intolerance (Gi) Other (See Comments)    Pt has Crohn's    Prednisone  Other (See Comments)    Upset stomach   Septra [Sulfamethoxazole-Trimethoprim] Nausea Only    Medications:   Prior to Admission medications   Medication Sig Start Date End Date Taking? Authorizing Provider  azelastine  (ASTELIN ) 0.1 % nasal spray Place 2 sprays into both nostrils 2 (two) times daily. Use in each nostril as directed 06/03/23  Yes Tobie Comp B, MD  budesonide  (ENTOCORT EC ) 3 MG 24 hr capsule  TAKE 3 CAPSULES (9 MG TOTAL) BY MOUTH DAILY. 08/07/23  Yes McGreal, Inocente HERO, MD  Calcium  Citrate-Vitamin D (CALCIUM  + D PO) Take 1 tablet by mouth 2 (two) times daily.   Yes [provider]  cetirizine  (ZYRTEC ) 10 MG tablet Take 1 tablet (10 mg total) by mouth daily. 07/24/23  Yes Theophilus Andrews, Tully GRADE, MD  dicyclomine  (BENTYL ) 20 MG tablet Take 1 tablet (20 mg total) by mouth 4 (four) times daily as needed for spasms. 11/21/22  Yes Abran Norleen SAILOR, MD  diphenoxylate -atropine  (LOMOTIL ) 2.5-0.025 MG tablet Take 2 tablets by mouth 3 (three) times daily. TAKE 2 TABLETS BY MOUTH IN THE MORNING ,AT NOON, AND AT BEDTIME AS DIRECTED 07/29/23 07/28/24 Yes McGreal, Inocente HERO, MD  DULoxetine  (CYMBALTA ) 60 MG capsule Take 1 capsule (60 mg total) by mouth 2 (two) times daily. 07/24/23  Yes Theophilus Andrews, Tully GRADE, MD  fluticasone  (FLONASE ) 50 MCG/ACT nasal spray SPRAY 2 SPRAYS INTO EACH NOSTRIL EVERY DAY 08/11/23  Yes Theophilus Andrews, Tully GRADE, MD  losartan  (COZAAR ) 100 MG tablet Take 1 tablet (100 mg total) by mouth daily. 07/24/23  Yes Theophilus Andrews, Tully GRADE, MD  meclizine  (ANTIVERT ) 25 MG tablet Take 25 mg by mouth 3 (three) times daily as needed for dizziness.   Yes [provider]  Multiple Vitamin (MULTIVITAMIN WITH MINERALS) TABS tablet Take 1 tablet by mouth daily.   Yes [provider]  omeprazole  (PRILOSEC) 40 MG capsule Take 1 capsule (40 mg total) by mouth daily. 07/29/23  Yes McGreal, Inocente HERO, MD  ondansetron  (ZOFRAN ) 4 MG tablet Take 1 tablet (4 mg total) by mouth every 8 (eight) hours as needed for nausea or vomiting. 10/13/23  Yes Theophilus Andrews, Tully GRADE, MD  albuterol  (VENTOLIN  HFA) 108 [phone] Base) MCG/ACT inhaler Inhale 2 puffs into the lungs every 6 (six) hours as needed for wheezing or shortness of breath. 05/27/22   Cobb, Comer GAILS, NP  doxycycline  (VIBRAMYCIN ) 100 MG capsule Take 1 capsule (100 mg total) by mouth 2 (two) times daily. 08/19/23   Nafziger, Darleene, NP   Iron, Ferrous Sulfate , 325 (65 Fe) MG TABS Take 325 mg by mouth at bedtime. Patient not taking: Reported on 11/05/2023 11/04/23   Theophilus Andrews, Tully GRADE, MD  ofloxacin  (FLOXIN ) 0.3 % OTIC solution PLACE 4 DROPS INTO BOTH EARS 2 (TWO) TIMES DAILY FOR 5 DAYS.    [provider]  ondansetron  (ZOFRAN -ODT) 4 MG disintegrating tablet 4mg  ODT q4 hours prn nausea/vomit 04/17/23   Floyd, Dan, DO  SKYRIZI  360 MG/2.4ML SOCT Inject 360 mg into the skin every 8 (eight) weeks. Give approximately on 12/29 or after.  Patient not taking: Reported on 11/05/2023 01/05/24   Suzann Inocente HERO, MD    Discontinued Meds:   Medications Discontinued During This Encounter  Medication Reason   lactated ringers  infusion    sodium bicarbonate 150 mEq in dextrose  5 % 1,150 mL infusion    furosemide  (LASIX ) 20 MG tablet Discontinued by provider   mupirocin  ointment (BACTROBAN ) 2 %    valACYclovir  (VALTREX ) 1000 MG tablet    triamcinolone  cream (KENALOG ) 0.1 %    DULoxetine  (CYMBALTA ) DR capsule 60 mg    sodium bicarbonate 150 mEq in sterile water  1,150 mL infusion     Social History:  reports that she quit smoking about 35 years ago. Her smoking use included cigarettes. She started smoking about 65 years ago. She has a 30 pack-year smoking history. She has never used smokeless tobacco. She reports that she does not currently use alcohol. She reports that she does not use drugs.  Family History:   Family History  Problem Relation Age of Onset   Diabetes Father    Parkinson's disease Father    Colon cancer Other    Asthma Neg Hx    Esophageal cancer Neg Hx    Pancreatic cancer Neg Hx    Liver disease Neg Hx    Stomach cancer Neg Hx     Blood pressure (!) 166/80, pulse 94, temperature 98.5 F (36.9 C), temperature source Oral, resp. rate 18, weight 55.6 kg, SpO2 92%. Physical Exam: Gen elderly lady, pleasant, hard of hearing Sclera anicteric, throat clear and mod dry  No jvd or bruits, neck veins  flat Chest clear bilat to bases RRR no MRG Abd soft ntnd no mass or ascites +bs Ext diffuse skin condition, no LE or UE edema, no other edema Neuro is alert, awake, nonfocal, gen'd weakness     Malachy Coleman, LYNWOOD ORN, MD 11/07/2023, 8:46 AM

## 2023-11-08 DIAGNOSIS — R112 Nausea with vomiting, unspecified: Secondary | ICD-10-CM

## 2023-11-08 DIAGNOSIS — R197 Diarrhea, unspecified: Secondary | ICD-10-CM | POA: Diagnosis not present

## 2023-11-08 DIAGNOSIS — N189 Chronic kidney disease, unspecified: Secondary | ICD-10-CM | POA: Diagnosis not present

## 2023-11-08 DIAGNOSIS — N179 Acute kidney failure, unspecified: Secondary | ICD-10-CM | POA: Diagnosis not present

## 2023-11-08 DIAGNOSIS — K5010 Crohn's disease of large intestine without complications: Secondary | ICD-10-CM | POA: Diagnosis not present

## 2023-11-08 LAB — C DIFFICILE QUICK SCREEN W PCR REFLEX
C Diff antigen: NEGATIVE
C Diff interpretation: NOT DETECTED
C Diff toxin: NEGATIVE

## 2023-11-08 LAB — GASTROINTESTINAL PANEL BY PCR, STOOL (REPLACES STOOL CULTURE)

## 2023-11-08 LAB — BASIC METABOLIC PANEL WITH GFR
Anion gap: 24 — ABNORMAL HIGH (ref 5–15)
BUN: 62 mg/dL — ABNORMAL HIGH (ref 8–23)
CO2: 28 mmol/L (ref 22–32)
Calcium: 7.7 mg/dL — ABNORMAL LOW (ref 8.9–10.3)
Chloride: 89 mmol/L — ABNORMAL LOW (ref 98–111)
Creatinine, Ser: 2.94 mg/dL — ABNORMAL HIGH (ref 0.44–1.00)
GFR, Estimated: 16 mL/min — ABNORMAL LOW (ref >60)
Glucose, Bld: 70 mg/dL (ref 70–99)
Potassium: 3.3 mmol/L — ABNORMAL LOW (ref 3.5–5.1)
Sodium: 141 mmol/L (ref 135–145)

## 2023-11-08 LAB — CBC WITH DIFFERENTIAL/PLATELET
Abs Immature Granulocytes: 0.04 K/uL (ref 0.00–0.07)
Basophils Absolute: 0.1 K/uL (ref 0.0–0.1)
Basophils Relative: 1 %
Eosinophils Absolute: 0.3 K/uL (ref 0.0–0.5)
Eosinophils Relative: 3 %
HCT: 28.1 % — ABNORMAL LOW (ref 36.0–46.0)
Hemoglobin: 9.6 g/dL — ABNORMAL LOW (ref 12.0–15.0)
Immature Granulocytes: 0 %
Lymphocytes Relative: 16 %
Lymphs Abs: 1.6 K/uL (ref 0.7–4.0)
MCH: 29 pg (ref 26.0–34.0)
MCHC: 34.2 g/dL (ref 30.0–36.0)
MCV: 84.9 fL (ref 80.0–100.0)
Monocytes Absolute: 0.9 K/uL (ref 0.1–1.0)
Monocytes Relative: 9 %
Neutro Abs: 7.2 K/uL (ref 1.7–7.7)
Neutrophils Relative %: 71 %
Platelets: 316 K/uL (ref 150–400)
RBC: 3.31 MIL/uL — ABNORMAL LOW (ref 3.87–5.11)
RDW: 14.6 % (ref 11.5–15.5)
WBC: 10.1 K/uL (ref 4.0–10.5)
nRBC: 0 % (ref 0.0–0.2)

## 2023-11-08 LAB — PHOSPHORUS: Phosphorus: 3.4 mg/dL (ref 2.5–4.6)

## 2023-11-08 LAB — MAGNESIUM: Magnesium: 1.2 mg/dL — ABNORMAL LOW (ref 1.7–2.4)

## 2023-11-08 MED ORDER — HYDRALAZINE HCL 20 MG/ML IJ SOLN
10.0000 mg | Freq: Four times a day (QID) | INTRAMUSCULAR | Status: DC | PRN
  Administered 2023-11-11: 10 mg via INTRAVENOUS
  Filled 2023-11-08: qty 1

## 2023-11-08 MED ORDER — AMLODIPINE BESYLATE 10 MG PO TABS
10.0000 mg | ORAL_TABLET | Freq: Every day | ORAL | Status: DC
  Administered 2023-11-09 – 2023-11-12 (×4): 10 mg via ORAL
  Filled 2023-11-08 (×4): qty 1

## 2023-11-08 MED ORDER — POTASSIUM CHLORIDE CRYS ER 20 MEQ PO TBCR
40.0000 meq | EXTENDED_RELEASE_TABLET | Freq: Two times a day (BID) | ORAL | Status: AC
  Administered 2023-11-08 (×2): 40 meq via ORAL
  Filled 2023-11-08 (×2): qty 2

## 2023-11-08 MED ORDER — MAGNESIUM SULFATE 4 GM/100ML IV SOLN
4.0000 g | Freq: Once | INTRAVENOUS | Status: AC
  Administered 2023-11-08: 4 g via INTRAVENOUS
  Filled 2023-11-08: qty 100

## 2023-11-08 NOTE — Progress Notes (Signed)
 [name] is an 78 y.o. female anemia, Crohn's, GERD, HTN, RA, CKD 3b, OA, pancreatic insufficiency who presented 10/28 to ED c/o diarrhea  after recently starting new med for Crohn's disease. Has had more flare-ups on the medication. Diarrhea for about 3 wks. No recent abx. Poor appetite. In ED BP 102/55, hR 80, RR 16-20, temp 97.7. RA 93% sats. Labs showed Na 128, K+ 6.9, CO2 11, BUN 111, creat 7.89, Ca 10.3, AG 16, Mg 2.1.  Alb 3.6. LFTs ok. WBC 18K, Hb 11. UA done 10/22 (1 wk ago) showed trace LE, +protein, otherwise negative. CT abd showed active colitis of the transverse and descending colon. Pt rec'd hyperkalemia temporizing measures, also IV rocephin and flagyl . Pt was admitted. We are asked to see for renal failure.    Abd CT noncon -> KIDNEYS, URETERS AND BLADDER: No stones in the kidneys or ureters. No hydronephrosis. No perinephric or periureteral stranding. Urinary bladder is unremarkable. Kidneys are normal in appearance.  Labs today -> Na 129, K+ 5.2, CO2 14, BUN 111, creat 7.67     Assessment/ Plan: AKI on CKD 3b: b/l creat 1.2- 1.4 from aug-oct 2025, eGFR 38- 41 ml/min. Creat here 7.6 on admit, in the setting of several days of diarrheal illness (hx of Crohns). CT showed colitis desc/ transverse colon. Pt is on ARB. Abd CT showed no obstruction. Pt is confused, but no other indication for RRT. Pt looks dry on exam. Suspect AKI is due to sig vol depletion + ARB effects +/- hypotension (on arrival); still could have ATN. - Still not eating much (no appetite) but diarrhea has slowed down.  - No labs today; will f/u results. Fortunately renal function was improving yest and more c/w prerenal azotemia (possibly had been in early ATN) at this point. No e/o obstruction.   -Monitor Daily I/Os, Daily weight  -Maintain MAP>65 for optimal renal perfusion.  - Avoid nephrotoxic agents such as IV contrast, NSAIDs, and phosphate containing bowel preps (FLEETS)  Metabolic acidosis: due to  diarrhea and AKI -> diarrhea improving past 24hrs. Hyperkalemia: K+ better now (likely with improving renal function and the HCO3).  Crohns' disease HTN: use anything other than ARB/ ACEi/ Diuretics   Subjective: Fewer BM's /24hrs but still has nausea.  Mild sob.   Chemistry and CBC: Creat  Date/Time Value Ref Range Status  11/08/2019 10:48 AM 1.10 (H) 0.60 - 0.93 mg/dL Final    Comment:    For patients >99 years of age, the reference limit for Creatinine is approximately 13% higher for people identified as African-American. SABRA   10/06/2019 10:46 AM 1.17 (H) 0.60 - 0.93 mg/dL Final    Comment:    For patients >14 years of age, the reference limit for Creatinine is approximately 13% higher for people identified as African-American. SABRA   09/16/2019 11:37 AM 1.27 (H) 0.60 - 0.93 mg/dL Final    Comment:    For patients >28 years of age, the reference limit for Creatinine is approximately 13% higher for people identified as African-American. .    Creatinine, Ser  Date/Time Value Ref Range Status  11/07/2023 03:07 AM 4.47 (H) 0.44 - 1.00 mg/dL Final  89/69/7974 97:88 AM 6.96 (H) 0.44 - 1.00 mg/dL Final  89/70/7974 94:98 AM 7.67 (H) 0.44 - 1.00 mg/dL Final  89/70/7974 98:61 AM 7.69 (H) 0.44 - 1.00 mg/dL Final  89/71/7974 89:90 PM 7.89 (H) 0.44 - 1.00 mg/dL Final  89/76/7974 88:96 AM 1.25 (H) 0.40 - 1.20 mg/dL Final  08/18/2023 01:59 PM 1.41 (H) 0.44 - 1.00 mg/dL Final  95/78/7974 97:82 PM 2.08 (H) 0.40 - 1.20 mg/dL Final  95/90/7974 91:69 PM 3.10 (H) 0.44 - 1.00 mg/dL Final  98/72/7974 95:74 AM 1.11 (H) 0.44 - 1.00 mg/dL Final  98/73/7974 94:95 AM 0.88 0.44 - 1.00 mg/dL Final  98/74/7974 94:51 AM 1.29 (H) 0.44 - 1.00 mg/dL Final  98/75/7974 96:48 PM 0.99 0.44 - 1.00 mg/dL Final  98/83/7974 89:61 AM 0.96 0.44 - 1.00 mg/dL Final  89/70/7975 95:72 AM 1.28 (H) 0.44 - 1.00 mg/dL Final  89/71/7975 89:96 AM 1.14 (H) 0.44 - 1.00 mg/dL Final  89/72/7975 90:42 PM 1.01 (H) 0.44 - 1.00  mg/dL Final  90/76/7975 88:69 AM 1.14 0.40 - 1.20 mg/dL Final  90/82/7975 92:78 AM 1.17 (H) 0.44 - 1.00 mg/dL Final  95/89/7975 88:69 AM 1.14 0.40 - 1.20 mg/dL Final  98/90/7975 98:99 PM 1.05 (H) 0.44 - 1.00 mg/dL Final  90/86/7976 88:67 AM 1.05 0.40 - 1.20 mg/dL Final  91/97/7976 89:94 AM 1.07 0.40 - 1.20 mg/dL Final  95/73/7976 95:98 PM 1.00 0.44 - 1.00 mg/dL Final  95/73/7976 96:77 PM 1.03 (H) 0.44 - 1.00 mg/dL Final  90/86/7977 88:63 AM 1.25 (H) 0.40 - 1.20 mg/dL Final  90/98/7977 96:53 PM 1.07 0.40 - 1.20 mg/dL Final  97/74/7978 96:72 AM 0.91 0.44 - 1.00 mg/dL Final  97/75/7978 95:72 AM 0.92 0.44 - 1.00 mg/dL Final  97/87/7978 96:79 PM 0.93 0.44 - 1.00 mg/dL Final  97/90/7978 96:65 PM 1.03 0.40 - 1.20 mg/dL Final  87/69/7979 87:99 PM 0.97 0.40 - 1.20 mg/dL Final  92/77/7979 96:80 AM 0.86 0.44 - 1.00 mg/dL Final  92/83/7979 88:61 AM 0.82 0.44 - 1.00 mg/dL Final   Recent Labs  Lab 11/04/23 2209 11/05/23 0138 11/05/23 0501 11/06/23 0211 11/07/23 0307  NA 128* 130* 129* 134* 136  K 6.9* 5.6* 5.2* 4.3 3.8  CL 101 104 100 103 97*  CO2 11* 12* 14* 16* 22  GLUCOSE 102* 99 86 100* 91  BUN 111* 112* 111* 116* 92*  CREATININE 7.89* 7.69* 7.67* 6.96* 4.47*  CALCIUM  10.3 10.0 11.9* 8.6* 8.2*  PHOS  --   --   --   --  2.9   Recent Labs  Lab 11/04/23 2209 11/05/23 0501 11/06/23 0211 11/07/23 0307  WBC 18.4* 17.4* 8.6 9.0  NEUTROABS  --   --  5.3  --   HGB 11.2* 10.7* 9.4* 9.7*  HCT 36.1 34.6* 27.4* 28.2*  MCV 91.2 90.1 85.9 83.9  PLT 388 341 329 336   Liver Function Tests: Recent Labs  Lab 11/04/23 2209 11/05/23 0501 11/06/23 0211 11/07/23 0307  AST 24 13* 13*  --   ALT 11 10 11   --   ALKPHOS 97 93 62  --   BILITOT 0.3 0.3 0.8  --   PROT 7.6 7.3 6.0*  --   ALBUMIN 3.6 3.6 2.6* 2.6*   Recent Labs  Lab 11/04/23 2209  LIPASE 58*   No results for input(s): AMMONIA in the last 168 hours. Cardiac Enzymes: No results for input(s): CKTOTAL, CKMB, CKMBINDEX,  TROPONINI in the last 168 hours. Iron Studies: No results for input(s): IRON, TIBC, TRANSFERRIN, FERRITIN in the last 72 hours. PT/INR: @LABRCNTIP (inr:5)  Xrays/Other Studies: ) Results for orders placed or performed during the hospital encounter of 11/04/23 (from the past 48 hours)  C-reactive protein     Status: Abnormal   Collection Time: 11/06/23  6:39 PM  Result Value Ref Range   CRP  4.1 (H) <1.0 mg/dL    Comment: Performed at [facility], [address] [place], [place] [address]  Sedimentation rate     Status: Abnormal   Collection Time: 11/06/23  6:39 PM  Result Value Ref Range   Sed Rate 30 (H) 0 - 22 mm/hr    Comment: Performed at [facility], [address], [place], [place] [address]  CBC     Status: Abnormal   Collection Time: 11/07/23  3:07 AM  Result Value Ref Range   WBC 9.0 4.0 - 10.5 K/uL   RBC 3.36 (L) 3.87 - 5.11 MIL/uL   Hemoglobin 9.7 (L) 12.0 - 15.0 g/dL   HCT 71.7 (L) 63.9 - 53.9 %   MCV 83.9 80.0 - 100.0 fL   MCH 28.9 26.0 - 34.0 pg   MCHC 34.4 30.0 - 36.0 g/dL   RDW 85.3 88.4 - 84.4 %   Platelets 336 150 - 400 K/uL   nRBC 0.0 0.0 - 0.2 %    Comment: Performed at [facility], [address], [place], [place] [address]  Renal function panel     Status: Abnormal   Collection Time: 11/07/23  3:07 AM  Result Value Ref Range   Sodium 136 135 - 145 mmol/L   Potassium 3.8 3.5 - 5.1 mmol/L   Chloride 97 (L) 98 - 111 mmol/L   CO2 22 22 - 32 mmol/L   Glucose, Bld 91 70 - 99 mg/dL    Comment: Glucose reference range applies only to samples taken after fasting for at least 8 hours.   BUN 92 (H) 8 - 23 mg/dL   Creatinine, Ser 5.52 (H) 0.44 - 1.00 mg/dL   Calcium  8.2 (L) 8.9 - 10.3 mg/dL   Phosphorus 2.9 2.5 - 4.6 mg/dL   Albumin 2.6 (L) 3.5 - 5.0 g/dL   GFR, Estimated 10 (L) >60 mL/min    Comment: (NOTE) Calculated using the CKD-EPI Creatinine Equation (2021)    Anion gap 17 (H) 5 - 15    Comment: Performed at [facility], [address], [place], [place] [address]   No results found.   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   Whooping cough 2015   Whooping cough with pneumonia    2017    PSH:   Past Surgical History:  Procedure Laterality Date   BIOPSY  11/05/2022   Procedure: BIOPSY;  Surgeon: Albertus Gordy HERO, MD;  Location: [facility] ENDOSCOPY;  Service: Gastroenterology;;   CATARACT EXTRACTION, BILATERAL  2018   with IOC implant    COLONOSCOPY     DEBRIDEMENT AND CLOSURE WOUND Right 10/01/2018   Procedure: Excision of right knee wound with placement of ACell and Pravena;  Surgeon: Lowery,  Estefana RAMAN, DO;  Location: [facility];  Service: Plastics;  Laterality: Right;  30 min   ESOPHAGOGASTRODUODENOSCOPY (EGD) WITH PROPOFOL  N/A 11/05/2022   Procedure: ESOPHAGOGASTRODUODENOSCOPY (EGD) WITH PROPOFOL ;  Surgeon: Albertus Gordy HERO, MD;  Location: [facility] ENDOSCOPY;  Service: Gastroenterology;  Laterality: N/A;   HERNIA REPAIR     knee fracture  Right    NASAL FRACTURE SURGERY     needed b/c she was getting frequent sinus infections    TONSILLECTOMY     TOTAL KNEE ARTHROPLASTY Right 07/28/2018   Procedure: TOTAL KNEE ARTHROPLASTY;  Surgeon: Ernie Cough, MD;  Location: [facility];  Service: Orthopedics;  Laterality: Right;   TOTAL KNEE ARTHROPLASTY Left 03/02/2019   Procedure: TOTAL KNEE ARTHROPLASTY;  Surgeon: Ernie Cough, MD;   Location: [facility];  Service: Orthopedics;  Laterality: Left;  70 mins   UPPER GASTROINTESTINAL ENDOSCOPY     XI ROBOTIC ASSISTED HIATAL HERNIA REPAIR N/A 01/31/2023   Procedure: XI ROBOTIC ASSISTED HIATAL HERNIA REPAIR WITH MESH;  Surgeon: Lyndel Deward PARAS, MD;  Location: [facility];  Service: General;  Laterality: N/A;    Allergies:  Allergies  Allergen Reactions   Aspirin Other (See Comments)    Intestinal Bleeding   Latex Other (See Comments)    Redness and skin peeling   Other Itching    Nuts-itching in throat  Seeds-stomach issues with chrons  Pt has emphysema    Skyrizi  [Risankizumab ]     Excessive Diarrhea   Sulfa Antibiotics Nausea And Vomiting, Nausea Only, Other (See Comments) and Swelling   Adhesive [Tape] Other (See Comments)    Redness and skin peeling   Ciprofloxacin Nausea And Vomiting   Codeine Nausea And Vomiting   Demerol [Meperidine Hcl] Nausea And Vomiting   Fentanyl  Nausea And Vomiting    Makes me very sick   Lactose Intolerance (Gi) Other (See Comments)    Pt has Crohn's    Prednisone  Other (See Comments)    Upset stomach   Septra [Sulfamethoxazole-Trimethoprim] Nausea Only    Medications:   Prior to Admission medications   Medication Sig Start Date End Date Taking? Authorizing Provider  azelastine  (ASTELIN ) 0.1 % nasal spray Place 2 sprays into both nostrils 2 (two) times daily. Use in each nostril as directed 06/03/23  Yes Tobie Comp B, MD  budesonide  (ENTOCORT EC ) 3 MG 24 hr capsule TAKE 3 CAPSULES (9 MG TOTAL) BY MOUTH DAILY. 08/07/23  Yes McGreal, Inocente HERO, MD  Calcium  Citrate-Vitamin D (CALCIUM  + D PO) Take 1 tablet by mouth 2 (two) times daily.   Yes [provider]  cetirizine  (ZYRTEC ) 10 MG tablet Take 1 tablet (10 mg total) by mouth daily. 07/24/23  Yes Theophilus Andrews, Tully GRADE, MD  dicyclomine  (BENTYL ) 20 MG tablet Take 1 tablet (20 mg total) by mouth 4 (four) times daily as needed for spasms. 11/21/22  Yes Abran Norleen SAILOR, MD   diphenoxylate -atropine  (LOMOTIL ) 2.5-0.025 MG tablet Take 2 tablets by mouth 3 (three) times daily. TAKE 2 TABLETS BY MOUTH IN THE MORNING ,AT NOON, AND AT BEDTIME AS DIRECTED 07/29/23 07/28/24 Yes McGreal, Inocente HERO, MD  DULoxetine  (CYMBALTA ) 60 MG capsule Take 1 capsule (60 mg total) by mouth 2 (two) times daily. 07/24/23  Yes Theophilus Andrews, Tully GRADE, MD  fluticasone  (FLONASE ) 50 MCG/ACT nasal spray SPRAY 2 SPRAYS INTO EACH NOSTRIL EVERY DAY 08/11/23  Yes Theophilus Andrews, Tully GRADE, MD  losartan  (COZAAR ) 100 MG tablet Take 1 tablet (100 mg total) by mouth daily. 07/24/23  Yes Theophilus Andrews, Tully GRADE, MD  meclizine  (ANTIVERT ) 25 MG tablet Take 25 mg by mouth 3 (three) times daily as needed for dizziness.   Yes [provider]  Multiple Vitamin (MULTIVITAMIN WITH MINERALS) TABS tablet Take 1 tablet by mouth daily.   Yes [provider]  omeprazole  (PRILOSEC) 40 MG capsule Take 1 capsule (40 mg total) by mouth daily. 07/29/23  Yes McGreal, Inocente HERO, MD  ondansetron  (ZOFRAN ) 4 MG tablet Take 1 tablet (4 mg total) by mouth every 8 (eight) hours as needed for nausea or vomiting. 10/13/23  Yes Theophilus Andrews, Tully GRADE, MD  albuterol  (VENTOLIN  HFA) 108 [phone] Base) MCG/ACT inhaler Inhale 2 puffs into the lungs every 6 (six) hours as needed for wheezing or shortness of breath. 05/27/22   Cobb, Comer GAILS, NP  doxycycline  (VIBRAMYCIN ) 100 MG capsule Take 1 capsule (100 mg total) by mouth 2 (two) times daily. 08/19/23   Nafziger, Cory, NP  Iron, Ferrous Sulfate , 325 (65 Fe) MG TABS Take 325 mg by mouth at bedtime. Patient not taking: Reported on 11/05/2023 11/04/23   Theophilus Andrews, Tully GRADE, MD  ofloxacin  (FLOXIN ) 0.3 % OTIC solution PLACE 4 DROPS INTO BOTH EARS 2 (TWO) TIMES DAILY FOR 5 DAYS.    [provider]  ondansetron  (ZOFRAN -ODT) 4 MG disintegrating tablet 4mg  ODT q4 hours prn nausea/vomit 04/17/23   Floyd, Dan, DO  SKYRIZI  360 MG/2.4ML SOCT Inject 360 mg into the skin every 8  (eight) weeks. Give approximately on 12/29 or after. Patient not taking: Reported on 11/05/2023 01/05/24   Suzann Inocente HERO, MD    Discontinued Meds:   Medications Discontinued During This Encounter  Medication Reason   lactated ringers  infusion    sodium bicarbonate 150 mEq in dextrose  5 % 1,150 mL infusion    furosemide  (LASIX ) 20 MG tablet Discontinued by provider   mupirocin  ointment (BACTROBAN ) 2 %    valACYclovir  (VALTREX ) 1000 MG tablet    triamcinolone  cream (KENALOG ) 0.1 %    DULoxetine  (CYMBALTA ) DR capsule 60 mg    sodium bicarbonate 150 mEq in sterile water  1,150 mL infusion     Social History:  reports that she quit smoking about 35 years ago. Her smoking use included cigarettes. She started smoking about 65 years ago. She has a 30 pack-year smoking history. She has never used smokeless tobacco. She reports that she does not currently use alcohol. She reports that she does not use drugs.  Family History:   Family History  Problem Relation Age of Onset   Diabetes Father    Parkinson's disease Father    Colon cancer Other    Asthma Neg Hx    Esophageal cancer Neg Hx    Pancreatic cancer Neg Hx    Liver disease Neg Hx    Stomach cancer Neg Hx     Blood pressure (!) 155/85, pulse 91, temperature 97.8 F (36.6 C), temperature source Oral, resp. rate 20, weight 58.8 kg, SpO2 98%. Physical Exam: Gen elderly lady, pleasant, hard of hearing Sclera anicteric, throat clear and mod dry  No jvd or bruits, neck veins flat Chest clear bilat to bases RRR no MRG Abd soft ntnd no mass or ascites +bs Ext diffuse skin condition, no LE or UE edema, no other edema Neuro is alert, awake, nonfocal, gen'd weakness     [name], LYNWOOD ORN, MD 11/08/2023, 7:21 AM
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
## 2023-11-08 NOTE — Plan of Care (Signed)

## 2023-11-08 NOTE — Plan of Care (Signed)
  Problem: Education: Goal: Knowledge of General Education information will improve Description: Including pain rating scale, medication(s)/side effects and non-pharmacologic comfort measures Outcome: Progressing   Problem: Coping: Goal: Level of anxiety will decrease Outcome: Progressing   Problem: Elimination: Goal: Will not experience complications related to bowel motility Outcome: Progressing   Problem: Safety: Goal: Ability to remain free from injury will improve Outcome: Progressing   Problem: Skin Integrity: Goal: Risk for impaired skin integrity will decrease Outcome: Progressing   

## 2023-11-08 NOTE — Progress Notes (Addendum)
 Progress Note   LOS: 3 days   Chief Complaint: Acute on chronic diarrhea   Subjective   Patient actively dry heaving at the time of my evaluation.  Patient states she has been like this since last night despite receiving scheduled antiemetics.  No further bowel movements in the last 24 hours   Objective   Vital signs in last 24 hours: Temp:  [97.8 F (36.6 C)-98.9 F (37.2 C)] 98.9 F (37.2 C) (11/01 0800) Pulse Rate:  [91-94] 94 (11/01 0800) Resp:  [20] 20 (11/01 0800) BP: (149-164)/(62-85) 158/76 (11/01 0800) SpO2:  [95 %-98 %] 95 % (11/01 0800) Weight:  [58.8 kg] 58.8 kg (11/01 0500) Last BM Date : 11/07/23 Last BM recorded by nurses in past 5 days No data recorded  General:   female leaning over a trash can dry heaving Heart:  Regular rate and rhythm; no murmurs Pulm: Clear anteriorly; no wheezing Abdomen: soft, nondistended, normal bowel sounds in all quadrants. Nontender without guarding. No organomegaly appreciated. Extremities:  No edema Neurologic:  Alert and  oriented x4;  No focal deficits.  Psych:  Cooperative. Normal mood and affect.  Intake/Output from previous day: 10/31 0701 - 11/01 0700 In: 3188.9 [P.O.:600; I.V.:2388.9; IV Piggyback:200] Out: 500 [Urine:500] Intake/Output this shift: Total I/O In: 635.3 [I.V.:612.6; IV Piggyback:22.7] Out: -   Studies/Results: No results found.  Lab Results: Recent Labs    11/06/23 0211 11/07/23 0307 11/08/23 0725  WBC 8.6 9.0 10.1  HGB 9.4* 9.7* 9.6*  HCT 27.4* 28.2* 28.1*  PLT 329 336 316   BMET Recent Labs    11/06/23 0211 11/07/23 0307 11/08/23 0725  NA 134* 136 141  K 4.3 3.8 3.3*  CL 103 97* 89*  CO2 16* 22 28  GLUCOSE 100* 91 70  BUN 116* 92* 62*  CREATININE 6.96* 4.47* 2.94*  CALCIUM  8.6* 8.2* 7.7*   LFT Recent Labs    11/06/23 0211 11/07/23 0307  PROT 6.0*  --   ALBUMIN 2.6* 2.6*  AST 13*  --   ALT 11  --   ALKPHOS 62  --   BILITOT 0.8  --    PT/INR No results for  input(s): LABPROT, INR in the last 72 hours.   Scheduled Meds:  [START ON 11/09/2023] amLODipine  10 mg Oral Daily   diphenoxylate -atropine   2 tablet Oral TID   ferrous sulfate   325 mg Oral QHS   heparin   5,000 Units Subcutaneous Q8H   pantoprazole   40 mg Oral Daily   potassium chloride  40 mEq Oral BID   sodium chloride  flush  3 mL Intravenous Q12H   sodium chloride  flush  3 mL Intravenous Q12H   Continuous Infusions:  cefTRIAXone (ROCEPHIN)  IV Stopped (11/07/23 2255)   sodium bicarbonate 150 mEq in sterile water  1,150 mL infusion Stopped (11/08/23 1242)      Patient profile:   78 y.o. 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   Admitted with acute on chronic diarrhea and  AKI  Patient was initially admitted t to [place] on 11/01/23.she had significant AKI with metabolic acidosis.  She was seen by nephrology and subsequently transferred to [facility] . CT scan showing active colitis involving the transverse and descending colon.  Patient convinced that worsening diarrhea is secondary to Skyrizi  (recently initiated).  Other etiologies of worsening diarrhea are under investigation (inpatient and outpatient).  She also feels diarrhea related to her history of SIBO  ESR 30/CRP 4.1 TODAY: No stools in the last 24 hours.  Dry heaving at the time of my evaluation despite scheduled antiemetics.  With continued  improvement in stools suspect infectious process since antibiotics seem to be helping her diarrhea.  Although it is possible Flagyl  may be resulting in her nausea/vomiting   History of SIBO Generally takes Augmentin  which she finds helpful   AKI on CKD 3B AKI in setting of diarrhea  /volume depletion . Patient was initially admitted to [facility].  Seen by our inpatient team there yesterday but transferred to [facility] with AKI /metabolic acidosis.  Nephrology is following TODAY: Improving creatinine and GFR (2.94/16)   Large paraesophageal hiatal hernia status post robotic surgery January 2025   Rheumatoid arthritis   Hypertension    Plan:   -Continuing to hold off on steroids since diarrhea has improved on antibiotics - If patient can produce a stool sample can complete infectious studies - Continue to monitor - Continue scheduled antiemetics - Discontinue Flagyl  to see if this helps her nausea/vomiting  Bayley CHRISTELLA Blower  11/08/2023, 2:07 PM   ---------------------------------------------------------------------------------------------------------------------------------------  I have taken a history, reviewed the chart and examined the patient. I performed a substantive portion of this encounter, including complete performance of at least one of the key components, in conjunction with the APP. I agree with the APP's note, impression and recommendations.  Patient's diarrhea continues to be much improved on empiric antibiotics.  No steroids or additional Crohn's therapy has been given.  With improvement in symptoms with diarrhea, suspect diarrhea is infectious, as opposed to Crohn's flare.  She continues to have significant nausea and retching.  This only started since admission.  We have stopped the Flagyl  today in the hopes that this may be the underlying cause of her nausea/vomiting.  The patient indicates that she thinks she does recall having similar symptoms when she took  Flagyl  orally in the past.  Stool sample has been submitted to assess for infectious etiologies and fecal calprotectin.  Will await results.  Renal function continues to improve rapidly.  Will see if patient's nausea/retching improves tomorrow.  If so, we should add Flagyl  as a medication allergy/intolerance.  Janaisha Tolsma E. Stacia, MD [facility]

## 2023-11-08 NOTE — Progress Notes (Signed)
 PROGRESS NOTE        PATIENT DETAILS Name: [name] Age: 78 y.o. Sex: female Date of Birth: 1945/11/28 Admit Date: 11/04/2023 Admitting Physician Micaela Speaker, MD ERE:Yzmwjwizs [name] GRADE, MD  Brief Summary: Patient is a 78 y.o.  female with history of chron's disease, HTN-who presented with diarrhea/weakness-found to have AKI and subsequently admitted to the hospitalist service.  Significant events: 10/29>> admit to [facility]  Significant studies: 10/28>> CT abdomen/pelvis: Active colitis-transverse/descending colon.  Significant microbiology data: 10/29>> blood culture: No growth  Procedures: None  Consults: GI Nephrology  Subjective:  Patient in bed, appears comfortable, denies any headache, no fever, no chest pain or pressure, no shortness of breath , no abdominal pain. No new focal weakness.  Diarrhea gradually improving   Objective: Vitals: Blood pressure (!) 158/76, pulse 94, temperature 98.9 F (37.2 C), temperature source Oral, resp. rate 20, weight 58.8 kg, SpO2 95%.   Exam:  Awake Alert, No new F.N deficits, Normal affect [place].AT,PERRAL Supple Neck, No JVD,   Symmetrical Chest wall movement, Good air movement bilaterally, CTAB RRR,No Gallops, Rubs or new Murmurs,  +ve B.Sounds, Abd Soft, No tenderness,   No Cyanosis, Clubbing or edema    Assessment/Plan:  AKI on CKD stage IIIa AKI likely hemodynamically mediated in the setting of diarrhea/losartan  use.  UA without proteinuria-CT without any hydronephrosis. Creatinine improving with supportive care  No acute indications for HD Continue IV fluid hydration. Avoid nephrotoxic agents Follow electrolytes.  Metabolic acidosis Secondary to diarrhea/AKI Bicarb levels have improved after improvement in diarrhea and improvement in renal function-Will cautiously continue with IV bicarb fluid today-suspect this can be discontinued soon.  Hypokalemia and  hypomagnesemia Replace  Acute on chronic diarrhea-likely secondary to Crohn's colitis Diarrhea seems to have slowed down-Will discuss with nursing staff to send stool studies if she has loose stools today. On empiric Rocephin/Lomotil  and as needed Bentyl .  Flagyl  stopped by GI on 11/08/2023. Await GI recommendations. Skyrizi  on hold  Normocytic anemia No evidence of blood loss-this is likely secondary to acute illness Follow CBC  HTN Losartan  remains on hold due to AKI BP is high increase Norvasc dose add as needed hydralazine   GERD PPI  GAD  Cymbalta  on hold-Due to AKI  Hard of hearing Has hearing aids at bedside  Code status:   Code Status: Full Code   DVT Prophylaxis: heparin  injection 5,000 Units Start: 11/05/23 0600 SCDs Start: 11/05/23 0114 Place TED hose Start: 11/05/23 0114   Family Communication: Spouse at bedside 10/30-none at bedside this morning.   Disposition Plan: Status is: Inpatient Remains inpatient appropriate because: Severity of illness   Planned Discharge Destination:Home   Diet: Diet Order             Diet renal with fluid restriction Fluid restriction: Other (see comments); Room service appropriate? Yes; Fluid consistency: Thin  Diet effective now                      MEDICATIONS: Scheduled Meds:  amLODipine  5 mg Oral Daily   diphenoxylate -atropine   2 tablet Oral TID   ferrous sulfate   325 mg Oral QHS   heparin   5,000 Units Subcutaneous Q8H   pantoprazole   40 mg Oral Daily   potassium chloride  40 mEq Oral BID   sodium chloride  flush  3 mL Intravenous Q12H  sodium chloride  flush  3 mL Intravenous Q12H   Continuous Infusions:  cefTRIAXone (ROCEPHIN)  IV Stopped (11/07/23 2255)   magnesium  sulfate bolus IVPB     sodium bicarbonate 150 mEq in sterile water  1,150 mL infusion 125 mL/hr at 11/08/23 0728   PRN Meds:.acetaminophen  **OR** acetaminophen , albuterol , bismuth subsalicylate, dicyclomine , ondansetron  **OR**  ondansetron  (ZOFRAN ) IV, prochlorperazine , sodium chloride  flush   I have personally reviewed following labs and imaging studies  LABORATORY DATA: CBC: Recent Labs  Lab 11/04/23 2209 11/05/23 0501 11/06/23 0211 11/07/23 0307 11/08/23 0725  WBC 18.4* 17.4* 8.6 9.0 10.1  NEUTROABS  --   --  5.3  --  7.2  HGB 11.2* 10.7* 9.4* 9.7* 9.6*  HCT 36.1 34.6* 27.4* 28.2* 28.1*  MCV 91.2 90.1 85.9 83.9 84.9  PLT 388 341 329 336 316    Basic Metabolic Panel: Recent Labs  Lab 11/04/23 2217 11/05/23 0138 11/05/23 0501 11/06/23 0211 11/07/23 0307 11/08/23 0725  NA  --  130* 129* 134* 136 141  K  --  5.6* 5.2* 4.3 3.8 3.3*  CL  --  104 100 103 97* 89*  CO2  --  12* 14* 16* 22 28  GLUCOSE  --  99 86 100* 91 70  BUN  --  112* 111* 116* 92* 62*  CREATININE  --  7.69* 7.67* 6.96* 4.47* 2.94*  CALCIUM   --  10.0 11.9* 8.6* 8.2* 7.7*  MG 2.1  --   --  1.7  --  1.2*  PHOS  --   --   --   --  2.9 3.4    GFR: Estimated Creatinine Clearance: 12.6 mL/min (A) (by C-G formula based on SCr of 2.94 mg/dL (H)).  Liver Function Tests: Recent Labs  Lab 11/04/23 2209 11/05/23 0501 11/06/23 0211 11/07/23 0307  AST 24 13* 13*  --   ALT 11 10 11   --   ALKPHOS 97 93 62  --   BILITOT 0.3 0.3 0.8  --   PROT 7.6 7.3 6.0*  --   ALBUMIN 3.6 3.6 2.6* 2.6*   Recent Labs  Lab 11/04/23 2209  LIPASE 58*   No results for input(s): AMMONIA in the last 168 hours.  Coagulation Profile: No results for input(s): INR, PROTIME in the last 168 hours.  Cardiac Enzymes: No results for input(s): CKTOTAL, CKMB, CKMBINDEX, TROPONINI in the last 168 hours.  BNP (last 3 results) No results for input(s): PROBNP in the last 8760 hours.  Lipid Profile: No results for input(s): CHOL, HDL, LDLCALC, TRIG, CHOLHDL, LDLDIRECT in the last 72 hours.  Thyroid  Function Tests: No results for input(s): TSH, T4TOTAL, FREET4, T3FREE, THYROIDAB in the last 72 hours.  Anemia  Panel: No results for input(s): VITAMINB12, FOLATE, FERRITIN, TIBC, IRON, RETICCTPCT in the last 72 hours.  Urine analysis:    Component Value Date/Time   COLORURINE YELLOW 11/05/2023 1643   APPEARANCEUR HAZY (A) 11/05/2023 1643   LABSPEC 1.015 11/05/2023 1643   PHURINE 5.0 11/05/2023 1643   GLUCOSEU NEGATIVE 11/05/2023 1643   GLUCOSEU NEGATIVE 02/16/2019 1534   HGBUR NEGATIVE 11/05/2023 1643   BILIRUBINUR NEGATIVE 11/05/2023 1643   BILIRUBINUR neg 10/29/2023 1652   KETONESUR NEGATIVE 11/05/2023 1643   PROTEINUR NEGATIVE 11/05/2023 1643   UROBILINOGEN 0.2 10/29/2023 1652   UROBILINOGEN 0.2 02/16/2019 1534   NITRITE NEGATIVE 11/05/2023 1643   LEUKOCYTESUR MODERATE (A) 11/05/2023 1643    Sepsis Labs: Lactic Acid, Venous    Component Value Date/Time   LATICACIDVEN 1.4 05/02/2021 1522  MICROBIOLOGY: Recent Results (from the past 240 hours)  Urine Culture     Status: None   Collection Time: 10/30/23  1:14 PM   Specimen: Urine  Result Value Ref Range Status   MICRO NUMBER: [id number]  Final   SPECIMEN QUALITY: Adequate  Final   Sample Source URINE  Final   STATUS: FINAL  Final   Result:   Final    Mixed genital flora isolated. These superficial bacteria are not indicative of a urinary tract infection. No further organism identification is warranted on this specimen. If clinically indicated, recollect clean-catch, mid-stream urine and transfer  immediately to Urine Culture Transport Tube.   Blood culture (routine x 2)     Status: None (Preliminary result)   Collection Time: 11/05/23  1:06 AM   Specimen: BLOOD LEFT FOREARM  Result Value Ref Range Status   Specimen Description   Final    BLOOD LEFT FOREARM Performed at [facility], [address], [place], [place] [address]    Special Requests   Final    Blood Culture results may not be optimal due to an inadequate volume of blood received in culture bottles BOTTLES DRAWN AEROBIC AND ANAEROBIC Performed  at [facility], [address], [place], [place] [address]    Culture   Final    NO GROWTH 3 DAYS Performed at [facility], [address], [place], [place] [address]    Report Status PENDING  Incomplete  Blood culture (routine x 2)     Status: None (Preliminary result)   Collection Time: 11/05/23  9:04 PM   Specimen: BLOOD  Result Value Ref Range Status   Specimen Description BLOOD SITE NOT SPECIFIED  Final   Special Requests   Final    BOTTLES DRAWN AEROBIC AND ANAEROBIC Blood Culture adequate volume   Culture   Final    NO GROWTH 3 DAYS Performed at [facility], [address], [place], [place] [address]    Report Status PENDING  Incomplete    RADIOLOGY STUDIES/RESULTS: No results found.    LOS: 3 days   Signature  -    Lavada Stank M.D on 11/08/2023 at 11:01 AM   -  To page go to [email or web address]

## 2023-11-09 DIAGNOSIS — R112 Nausea with vomiting, unspecified: Secondary | ICD-10-CM | POA: Diagnosis not present

## 2023-11-09 DIAGNOSIS — A040 Enteropathogenic Escherichia coli infection: Secondary | ICD-10-CM | POA: Diagnosis not present

## 2023-11-09 DIAGNOSIS — K5010 Crohn's disease of large intestine without complications: Secondary | ICD-10-CM | POA: Diagnosis not present

## 2023-11-09 DIAGNOSIS — N179 Acute kidney failure, unspecified: Secondary | ICD-10-CM | POA: Diagnosis not present

## 2023-11-09 DIAGNOSIS — R197 Diarrhea, unspecified: Secondary | ICD-10-CM | POA: Diagnosis not present

## 2023-11-09 LAB — BASIC METABOLIC PANEL WITH GFR
Anion gap: 20 — ABNORMAL HIGH (ref 5–15)
BUN: 41 mg/dL — ABNORMAL HIGH (ref 8–23)
CO2: 32 mmol/L (ref 22–32)
Calcium: 7.7 mg/dL — ABNORMAL LOW (ref 8.9–10.3)
Chloride: 86 mmol/L — ABNORMAL LOW (ref 98–111)
Creatinine, Ser: 2.45 mg/dL — ABNORMAL HIGH (ref 0.44–1.00)
GFR, Estimated: 20 mL/min — ABNORMAL LOW (ref >60)
Glucose, Bld: 101 mg/dL — ABNORMAL HIGH (ref 70–99)
Potassium: 4 mmol/L (ref 3.5–5.1)
Sodium: 138 mmol/L (ref 135–145)

## 2023-11-09 LAB — CBC WITH DIFFERENTIAL/PLATELET
Abs Immature Granulocytes: 0.08 K/uL — ABNORMAL HIGH (ref 0.00–0.07)
Basophils Absolute: 0.1 K/uL (ref 0.0–0.1)
Basophils Relative: 0 %
Eosinophils Absolute: 0.1 K/uL (ref 0.0–0.5)
Eosinophils Relative: 1 %
HCT: 29.1 % — ABNORMAL LOW (ref 36.0–46.0)
Hemoglobin: 10.1 g/dL — ABNORMAL LOW (ref 12.0–15.0)
Immature Granulocytes: 1 %
Lymphocytes Relative: 7 %
Lymphs Abs: 1 K/uL (ref 0.7–4.0)
MCH: 29.6 pg (ref 26.0–34.0)
MCHC: 34.7 g/dL (ref 30.0–36.0)
MCV: 85.3 fL (ref 80.0–100.0)
Monocytes Absolute: 0.8 K/uL (ref 0.1–1.0)
Monocytes Relative: 6 %
Neutro Abs: 11.3 K/uL — ABNORMAL HIGH (ref 1.7–7.7)
Neutrophils Relative %: 85 %
Platelets: 340 K/uL (ref 150–400)
RBC: 3.41 MIL/uL — ABNORMAL LOW (ref 3.87–5.11)
RDW: 14.6 % (ref 11.5–15.5)
WBC: 13.3 K/uL — ABNORMAL HIGH (ref 4.0–10.5)
nRBC: 0 % (ref 0.0–0.2)

## 2023-11-09 LAB — MAGNESIUM: Magnesium: 2 mg/dL (ref 1.7–2.4)

## 2023-11-09 LAB — PHOSPHORUS: Phosphorus: 2.6 mg/dL (ref 2.5–4.6)

## 2023-11-09 MED ORDER — ISOSORBIDE MONONITRATE ER 30 MG PO TB24
15.0000 mg | ORAL_TABLET | Freq: Every day | ORAL | Status: DC
  Administered 2023-11-09 – 2023-11-10 (×2): 15 mg via ORAL
  Filled 2023-11-09 (×2): qty 1

## 2023-11-09 NOTE — Plan of Care (Signed)

## 2023-11-09 NOTE — Progress Notes (Signed)
 Neilah I Dimascio is an 78 y.o. female anemia, Crohn's, GERD, HTN, RA, CKD 3b, OA, pancreatic insufficiency who presented 10/28 to ED c/o diarrhea  after recently starting new med for Crohn's disease. Has had more flare-ups on the medication. Diarrhea for about 3 wks. No recent abx. Poor appetite. In ED BP 102/55, hR 80, RR 16-20, temp 97.7. RA 93% sats. Labs showed Na 128, K+ 6.9, CO2 11, BUN 111, creat 7.89, Ca 10.3, AG 16, Mg 2.1.  Alb 3.6. LFTs ok. WBC 18K, Hb 11. UA done 10/22 (1 wk ago) showed trace LE, +protein, otherwise negative. CT abd showed active colitis of the transverse and descending colon. Pt rec'd hyperkalemia temporizing measures, also IV rocephin and flagyl . Pt was admitted. We are asked to see for renal failure.    Abd CT noncon -> KIDNEYS, URETERS AND BLADDER: No stones in the kidneys or ureters. No hydronephrosis. No perinephric or periureteral stranding. Urinary bladder is unremarkable. Kidneys are normal in appearance.  Labs today -> Na 129, K+ 5.2, CO2 14, BUN 111, creat 7.67     Assessment/ Plan: AKI on CKD 3b: b/l creat 1.2- 1.4 from aug-oct 2025, eGFR 38- 41 ml/min. Creat here 7.6 on admit, in the setting of several days of diarrheal illness (hx of Crohns). CT showed colitis desc/ transverse colon. Pt is on ARB. Abd CT showed no obstruction. Pt is confused, but no other indication for RRT. Pt looks dry on exam. Suspect AKI is due to sig vol depletion + ARB effects +/- hypotension (on arrival); still could have ATN. - Still not eating much (no appetite) but diarrhea has slowed down.  - Fortunately renal function continues to  improve more c/w prerenal azotemia (possibly had been in early ATN) at this point. No e/o obstruction.   Signing off at this time; please reconsult as needed.  -Monitor Daily I/Os, Daily weight  -Maintain MAP>65 for optimal renal perfusion.  - Avoid nephrotoxic agents such as IV contrast, NSAIDs, and phosphate containing bowel preps  (FLEETS)  Metabolic acidosis: due to diarrhea and AKI -> stiil on HCO3 therapy. Hyperkalemia: K+ better now (likely with improving renal function and the HCO3).  Crohns' disease HTN: use anything other than ARB/ ACEi/ Diuretics   Subjective: Nausea better but still present significantly.  Mild sob.   Chemistry and CBC: Creat  Date/Time Value Ref Range Status  11/08/2019 10:48 AM 1.10 (H) 0.60 - 0.93 mg/dL Final    Comment:    For patients >21 years of age, the reference limit for Creatinine is approximately 13% higher for people identified as African-American. SABRA   10/06/2019 10:46 AM 1.17 (H) 0.60 - 0.93 mg/dL Final    Comment:    For patients >52 years of age, the reference limit for Creatinine is approximately 13% higher for people identified as African-American. SABRA   09/16/2019 11:37 AM 1.27 (H) 0.60 - 0.93 mg/dL Final    Comment:    For patients >35 years of age, the reference limit for Creatinine is approximately 13% higher for people identified as African-American. .    Creatinine, Ser  Date/Time Value Ref Range Status  11/09/2023 03:58 AM 2.45 (H) 0.44 - 1.00 mg/dL Final  88/98/7974 92:74 AM 2.94 (H) 0.44 - 1.00 mg/dL Final  89/68/7974 96:92 AM 4.47 (H) 0.44 - 1.00 mg/dL Final  89/69/7974 97:88 AM 6.96 (H) 0.44 - 1.00 mg/dL Final  89/70/7974 94:98 AM 7.67 (H) 0.44 - 1.00 mg/dL Final  89/70/7974 98:61 AM 7.69 (H) 0.44 -  1.00 mg/dL Final  89/71/7974 89:90 PM 7.89 (H) 0.44 - 1.00 mg/dL Final  89/76/7974 88:96 AM 1.25 (H) 0.40 - 1.20 mg/dL Final  91/88/7974 98:40 PM 1.41 (H) 0.44 - 1.00 mg/dL Final  95/78/7974 97:82 PM 2.08 (H) 0.40 - 1.20 mg/dL Final  95/90/7974 91:69 PM 3.10 (H) 0.44 - 1.00 mg/dL Final  98/72/7974 95:74 AM 1.11 (H) 0.44 - 1.00 mg/dL Final  98/73/7974 94:95 AM 0.88 0.44 - 1.00 mg/dL Final  98/74/7974 94:51 AM 1.29 (H) 0.44 - 1.00 mg/dL Final  98/75/7974 96:48 PM 0.99 0.44 - 1.00 mg/dL Final  98/83/7974 89:61 AM 0.96 0.44 - 1.00 mg/dL Final   89/70/7975 95:72 AM 1.28 (H) 0.44 - 1.00 mg/dL Final  89/71/7975 89:96 AM 1.14 (H) 0.44 - 1.00 mg/dL Final  89/72/7975 90:42 PM 1.01 (H) 0.44 - 1.00 mg/dL Final  90/76/7975 88:69 AM 1.14 0.40 - 1.20 mg/dL Final  90/82/7975 92:78 AM 1.17 (H) 0.44 - 1.00 mg/dL Final  95/89/7975 88:69 AM 1.14 0.40 - 1.20 mg/dL Final  98/90/7975 98:99 PM 1.05 (H) 0.44 - 1.00 mg/dL Final  90/86/7976 88:67 AM 1.05 0.40 - 1.20 mg/dL Final  91/97/7976 89:94 AM 1.07 0.40 - 1.20 mg/dL Final  95/73/7976 95:98 PM 1.00 0.44 - 1.00 mg/dL Final  95/73/7976 96:77 PM 1.03 (H) 0.44 - 1.00 mg/dL Final  90/86/7977 88:63 AM 1.25 (H) 0.40 - 1.20 mg/dL Final  90/98/7977 96:53 PM 1.07 0.40 - 1.20 mg/dL Final  97/74/7978 96:72 AM 0.91 0.44 - 1.00 mg/dL Final  97/75/7978 95:72 AM 0.92 0.44 - 1.00 mg/dL Final  97/87/7978 96:79 PM 0.93 0.44 - 1.00 mg/dL Final  97/90/7978 96:65 PM 1.03 0.40 - 1.20 mg/dL Final  87/69/7979 87:99 PM 0.97 0.40 - 1.20 mg/dL Final  92/77/7979 96:80 AM 0.86 0.44 - 1.00 mg/dL Final  92/83/7979 88:61 AM 0.82 0.44 - 1.00 mg/dL Final   Recent Labs  Lab 11/04/23 2209 11/05/23 0138 11/05/23 0501 11/06/23 0211 11/07/23 0307 11/08/23 0725 11/09/23 0358  NA 128* 130* 129* 134* 136 141 138  K 6.9* 5.6* 5.2* 4.3 3.8 3.3* 4.0  CL 101 104 100 103 97* 89* 86*  CO2 11* 12* 14* 16* 22 28 32  GLUCOSE 102* 99 86 100* 91 70 101*  BUN 111* 112* 111* 116* 92* 62* 41*  CREATININE 7.89* 7.69* 7.67* 6.96* 4.47* 2.94* 2.45*  CALCIUM  10.3 10.0 11.9* 8.6* 8.2* 7.7* 7.7*  PHOS  --   --   --   --  2.9 3.4 2.6   Recent Labs  Lab 11/06/23 0211 11/07/23 0307 11/08/23 0725 11/09/23 0358  WBC 8.6 9.0 10.1 13.3*  NEUTROABS 5.3  --  7.2 11.3*  HGB 9.4* 9.7* 9.6* 10.1*  HCT 27.4* 28.2* 28.1* 29.1*  MCV 85.9 83.9 84.9 85.3  PLT 329 336 316 340   Liver Function Tests: Recent Labs  Lab 11/04/23 2209 11/05/23 0501 11/06/23 0211 11/07/23 0307  AST 24 13* 13*  --   ALT 11 10 11   --   ALKPHOS 97 93 62  --    BILITOT 0.3 0.3 0.8  --   PROT 7.6 7.3 6.0*  --   ALBUMIN 3.6 3.6 2.6* 2.6*   Recent Labs  Lab 11/04/23 2209  LIPASE 58*   No results for input(s): AMMONIA in the last 168 hours. Cardiac Enzymes: No results for input(s): CKTOTAL, CKMB, CKMBINDEX, TROPONINI in the last 168 hours. Iron Studies: No results for input(s): IRON, TIBC, TRANSFERRIN, FERRITIN in the last 72 hours. PT/INR: @LABRCNTIP (inr:5)  Xrays/Other  Studies: ) Results for orders placed or performed during the hospital encounter of 11/04/23 (from the past 48 hours)  Basic metabolic panel with GFR     Status: Abnormal   Collection Time: 11/08/23  7:25 AM  Result Value Ref Range   Sodium 141 135 - 145 mmol/L   Potassium 3.3 (L) 3.5 - 5.1 mmol/L   Chloride 89 (L) 98 - 111 mmol/L   CO2 28 22 - 32 mmol/L   Glucose, Bld 70 70 - 99 mg/dL    Comment: Glucose reference range applies only to samples taken after fasting for at least 8 hours.   BUN 62 (H) 8 - 23 mg/dL   Creatinine, Ser 7.05 (H) 0.44 - 1.00 mg/dL   Calcium  7.7 (L) 8.9 - 10.3 mg/dL   GFR, Estimated 16 (L) >60 mL/min    Comment: (NOTE) Calculated using the CKD-EPI Creatinine Equation (2021)    Anion gap 24 (H) 5 - 15    Comment: ELECTROLYTES REPEATED TO VERIFY Performed at [facility], [address], [place], [place] [address]   CBC with Differential/Platelet     Status: Abnormal   Collection Time: 11/08/23  7:25 AM  Result Value Ref Range   WBC 10.1 4.0 - 10.5 K/uL   RBC 3.31 (L) 3.87 - 5.11 MIL/uL   Hemoglobin 9.6 (L) 12.0 - 15.0 g/dL   HCT 71.8 (L) 63.9 - 53.9 %   MCV 84.9 80.0 - 100.0 fL   MCH 29.0 26.0 - 34.0 pg   MCHC 34.2 30.0 - 36.0 g/dL   RDW 85.3 88.4 - 84.4 %   Platelets 316 150 - 400 K/uL   nRBC 0.0 0.0 - 0.2 %   Neutrophils Relative % 71 %   Neutro Abs 7.2 1.7 - 7.7 K/uL   Lymphocytes Relative 16 %   Lymphs Abs 1.6 0.7 - 4.0 K/uL   Monocytes Relative 9 %   Monocytes Absolute 0.9 0.1 - 1.0 K/uL   Eosinophils  Relative 3 %   Eosinophils Absolute 0.3 0.0 - 0.5 K/uL   Basophils Relative 1 %   Basophils Absolute 0.1 0.0 - 0.1 K/uL   Immature Granulocytes 0 %   Abs Immature Granulocytes 0.04 0.00 - 0.07 K/uL    Comment: Performed at [facility], [address], [place], [place] [address]  Magnesium      Status: Abnormal   Collection Time: 11/08/23  7:25 AM  Result Value Ref Range   Magnesium  1.2 (L) 1.7 - 2.4 mg/dL    Comment: Performed at [facility], [address], [place], [place] [address]  Phosphorus     Status: None   Collection Time: 11/08/23  7:25 AM  Result Value Ref Range   Phosphorus 3.4 2.5 - 4.6 mg/dL    Comment: Performed at [facility], [address], [place], [place] [address]  CBC with Differential/Platelet     Status: Abnormal   Collection Time: 11/09/23  3:58 AM  Result Value Ref Range   WBC 13.3 (H) 4.0 - 10.5 K/uL   RBC 3.41 (L) 3.87 - 5.11 MIL/uL   Hemoglobin 10.1 (L) 12.0 - 15.0 g/dL   HCT 70.8 (L) 63.9 - 53.9 %   MCV 85.3 80.0 - 100.0 fL   MCH 29.6 26.0 - 34.0 pg   MCHC 34.7 30.0 - 36.0 g/dL   RDW 85.3 88.4 - 84.4 %   Platelets 340 150 - 400 K/uL   nRBC 0.0 0.0 -  0.2 %   Neutrophils Relative % 85 %   Neutro Abs 11.3 (H) 1.7 - 7.7 K/uL   Lymphocytes Relative 7 %   Lymphs Abs 1.0 0.7 - 4.0 K/uL   Monocytes Relative 6 %   Monocytes Absolute 0.8 0.1 - 1.0 K/uL   Eosinophils Relative 1 %   Eosinophils Absolute 0.1 0.0 - 0.5 K/uL   Basophils Relative 0 %   Basophils Absolute 0.1 0.0 - 0.1 K/uL   Immature Granulocytes 1 %   Abs Immature Granulocytes 0.08 (H) 0.00 - 0.07 K/uL    Comment: Performed at [facility], [address] [place], [place] [address]  Basic metabolic panel with GFR     Status: Abnormal   Collection Time: 11/09/23  3:58 AM  Result Value Ref Range   Sodium 138 135 - 145 mmol/L   Potassium 4.0 3.5 - 5.1 mmol/L   Chloride 86 (L) 98 - 111 mmol/L   CO2 32 22 - 32 mmol/L   Glucose, Bld 101 (H) 70 - 99 mg/dL     Comment: Glucose reference range applies only to samples taken after fasting for at least 8 hours.   BUN 41 (H) 8 - 23 mg/dL   Creatinine, Ser 7.54 (H) 0.44 - 1.00 mg/dL   Calcium  7.7 (L) 8.9 - 10.3 mg/dL   GFR, Estimated 20 (L) >60 mL/min    Comment: (NOTE) Calculated using the CKD-EPI Creatinine Equation (2021)    Anion gap 20 (H) 5 - 15    Comment: Performed at [facility], [address], [place], [place] [address]  Magnesium      Status: None   Collection Time: 11/09/23  3:58 AM  Result Value Ref Range   Magnesium  2.0 1.7 - 2.4 mg/dL    Comment: Performed at [facility], [address], [place], [place] [address]  Phosphorus     Status: None   Collection Time: 11/09/23  3:58 AM  Result Value Ref Range   Phosphorus 2.6 2.5 - 4.6 mg/dL    Comment: Performed at [facility], [address], [place], [place] [address]   No results found.   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   Whooping cough 2015   Whooping cough with pneumonia    2017    PSH:   Past Surgical History:  Procedure Laterality Date   BIOPSY  11/05/2022   Procedure: BIOPSY;  Surgeon: Albertus Gordy HERO, MD;  Location: [facility];  Service: Gastroenterology;;   CATARACT EXTRACTION, BILATERAL  2018   with IOC implant    COLONOSCOPY     DEBRIDEMENT AND CLOSURE WOUND Right 10/01/2018   Procedure: Excision of right knee wound with  placement of ACell and Pravena;  Surgeon:  Dillingham, Estefana RAMAN, DO;  Location: [facility];  Service: Plastics;  Laterality: Right;  30 min   ESOPHAGOGASTRODUODENOSCOPY (EGD) WITH PROPOFOL  N/A 11/05/2022   Procedure: ESOPHAGOGASTRODUODENOSCOPY (EGD) WITH PROPOFOL ;  Surgeon: Albertus Gordy HERO, MD;  Location: [facility] END[facility]COPY;  Service: Gastroenterology;  Laterality: N/A;   HERNIA REPAIR     knee fracture  Right    NASAL FRACTURE SURGERY     needed b/c she was getting frequent sinus infections    TONSILLECTOMY     TOTAL KNEE ARTHROPLASTY Right 07/28/2018   Procedure: TOTAL KNEE ARTHROPLASTY;  Surgeon: Ernie Cough, MD;  Location: [facility];  Service: Orthopedics;  Laterality: Right;   TOTAL KNEE ARTHROPLASTY Left 03/02/2019   Procedure: TOTAL KNEE ARTHROPLASTY;  Surgeon: Ernie Cough, MD;  Location: [facility];  Service: Orthopedics;  Laterality: Left;  70 mins   UPPER GASTROINTESTINAL ENDOSCOPY     XI ROBOTIC ASSISTED HIATAL HERNIA REPAIR N/A 01/31/2023   Procedure: XI ROBOTIC ASSISTED HIATAL HERNIA REPAIR WITH MESH;  Surgeon: Lyndel Deward PARAS, MD;  Location: [facility];  Service: General;  Laterality: N/A;    Allergies:  Allergies  Allergen Reactions   Aspirin Other (See Comments)    Intestinal Bleeding   Latex Other (See Comments)    Redness and skin peeling   Other Itching    Nuts-itching in throat  Seeds-stomach issues with chrons  Pt has emphysema    Skyrizi  [Risankizumab ]     Excessive Diarrhea   Sulfa Antibiotics Nausea And Vomiting, Nausea Only, Other (See Comments) and Swelling   Adhesive [Tape] Other (See Comments)    Redness and skin peeling   Ciprofloxacin Nausea And Vomiting   Codeine Nausea And Vomiting   Demerol [Meperidine Hcl] Nausea And Vomiting   Fentanyl  Nausea And Vomiting    Makes me very sick   Lactose Intolerance (Gi) Other (See Comments)    Pt has Crohn's    Prednisone  Other (See Comments)    Upset stomach   Septra [Sulfamethoxazole-Trimethoprim]  Nausea Only    Medications:   Prior to Admission medications   Medication Sig Start Date End Date Taking? Authorizing Provider  azelastine  (ASTELIN ) 0.1 % nasal spray Place 2 sprays into both nostrils 2 (two) times daily. Use in each nostril as directed 06/03/23  Yes Tobie Comp B, MD  budesonide  (ENTOCORT EC ) 3 MG 24 hr capsule TAKE 3 CAPSULES (9 MG TOTAL) BY MOUTH DAILY. 08/07/23  Yes McGreal, Inocente HERO, MD  Calcium  Citrate-Vitamin D (CALCIUM  + D PO) Take 1 tablet by mouth 2 (two) times daily.   Yes [provider]  cetirizine  (ZYRTEC ) 10 MG tablet Take 1 tablet (10 mg total) by mouth daily. 07/24/23  Yes Theophilus Andrews, Tully GRADE, MD  dicyclomine  (BENTYL ) 20 MG tablet Take 1 tablet (20 mg total) by mouth 4 (four) times daily as needed for spasms. 11/21/22  Yes Abran Norleen SAILOR, MD  diphenoxylate -atropine  (LOMOTIL ) 2.5-0.025 MG tablet Take 2 tablets by mouth 3 (three) times daily. TAKE 2 TABLETS BY MOUTH IN THE MORNING ,AT NOON, AND AT BEDTIME AS DIRECTED 07/29/23 07/28/24 Yes McGreal, Inocente HERO, MD  DULoxetine  (CYMBALTA ) 60 MG capsule Take 1 capsule (60 mg total) by mouth 2 (two) times daily. 07/24/23  Yes Theophilus Andrews, Tully GRADE, MD  fluticasone  (FLONASE ) 50 MCG/ACT nasal spray SPRAY 2 SPRAYS INTO EACH NOSTRIL EVERY DAY 08/11/23  Yes Theophilus Andrews, Tully GRADE, MD  losartan  (COZAAR ) 100 MG tablet Take 1 tablet (100 mg total) by mouth daily. 07/24/23  Yes Theophilus Andrews, Tully GRADE, MD  meclizine  (ANTIVERT ) 25 MG tablet Take 25 mg by mouth 3 (three) times daily as needed for dizziness.   Yes [provider]  Multiple Vitamin (MULTIVITAMIN WITH MINERALS) TABS tablet Take 1 tablet by mouth daily.   Yes [provider]  omeprazole  (PRILOSEC) 40 MG capsule Take 1 capsule (40 mg total) by mouth daily. 07/29/23  Yes McGreal, Inocente HERO, MD  ondansetron  (ZOFRAN ) 4 MG tablet Take 1 tablet (4 mg total) by mouth every 8 (eight) hours as needed for nausea or vomiting. 10/13/23  Yes Theophilus Andrews, Tully GRADE, MD  albuterol  (VENTOLIN  HFA) 108 [phone] Base) MCG/ACT inhaler Inhale 2 puffs into the lungs every 6 (six) hours as needed for wheezing or shortness of breath. 05/27/22   Cobb, Comer GAILS, NP  doxycycline  (VIBRAMYCIN ) 100 MG capsule Take 1 capsule (100 mg total) by mouth 2 (two) times daily. 08/19/23   Nafziger, Darleene, NP  Iron, Ferrous Sulfate , 325 (65 Fe) MG TABS Take 325 mg by mouth at bedtime. Patient not taking: Reported on 11/05/2023 11/04/23   Theophilus Andrews, Tully GRADE, MD  ofloxacin  (FLOXIN ) 0.3 % OTIC solution PLACE 4 DROPS INTO BOTH EARS 2 (TWO) TIMES DAILY FOR 5 DAYS.    [provider]  ondansetron  (ZOFRAN -ODT) 4 MG disintegrating tablet 4mg  ODT q4 hours prn nausea/vomit 04/17/23   Floyd, Dan, DO  SKYRIZI  360 MG/2.4ML SOCT Inject 360 mg into the skin every 8 (eight) weeks. Give approximately on 12/29 or after. Patient not taking: Reported on 11/05/2023 01/05/24   Suzann Inocente HERO, MD    Discontinued Meds:   Medications Discontinued During This Encounter  Medication Reason   lactated ringers  infusion    sodium bicarbonate 150 mEq in dextrose  5 % 1,150 mL infusion    furosemide  (LASIX ) 20 MG tablet Discontinued by provider   mupirocin  ointment (BACTROBAN ) 2 %    valACYclovir  (VALTREX ) 1000 MG tablet    triamcinolone  cream (KENALOG ) 0.1 %    DULoxetine  (CYMBALTA ) DR capsule 60 mg    sodium bicarbonate 150 mEq in sterile water  1,150 mL infusion    metroNIDAZOLE  (FLAGYL ) IVPB 500 mg    amLODipine (NORVASC) tablet 5 mg     Social History:  reports that she quit smoking about 35 years ago. Her smoking use included cigarettes. She started smoking about 65 years ago. She has a 30 pack-year smoking history. She has never used smokeless tobacco. She reports that she does not currently use alcohol. She reports that she does not use drugs.  Family History:   Family History  Problem Relation Age of Onset   Diabetes Father    Parkinson's disease Father    Colon  cancer Other    Asthma Neg Hx    Esophageal cancer Neg Hx    Pancreatic cancer Neg Hx    Liver disease Neg Hx    Stomach cancer Neg Hx     Blood pressure (!) 158/86, pulse 90, temperature 98.1 F (36.7 C), temperature source Oral, resp. rate 18, weight 55.7 kg, SpO2 100%. Physical Exam: Gen elderly lady, pleasant, hard of hearing Sclera anicteric, throat clear and mod dry  No jvd or bruits, neck veins flat Chest clear bilat to bases RRR no MRG Abd soft ntnd no mass or ascites +bs Ext diffuse skin condition, no LE or UE edema, no other edema Neuro is alert, awake, nonfocal, gen'd weakness     Ardyth Kelso, LYNWOOD ORN, MD 11/09/2023, 7:18 AM

## 2023-11-09 NOTE — Progress Notes (Signed)
 PROGRESS NOTE        PATIENT DETAILS Name: [name] Age: 78 y.o. Sex: female Date of Birth: 1945-11-28 Admit Date: 11/04/2023 Admitting Physician Micaela Speaker, MD ERE:Yzmwjwizs [name] GRADE, MD  Brief Summary: Patient is a 78 y.o.  female with history of chron's disease, HTN-who presented with diarrhea/weakness-found to have AKI and subsequently admitted to the hospitalist service.  Significant events: 10/29>> admit to [facility]  Significant studies: 10/28>> CT abdomen/pelvis: Active colitis-transverse/descending colon.  Significant microbiology data: 10/29>> blood culture: No growth  Procedures: None  Consults: GI Nephrology  Subjective:  Patient in bed, appears comfortable, denies any headache, no fever, no chest pain or pressure, no shortness of breath , no abdominal pain, mild nausea but no emesis in 3 days, still has some diarrhea. No new focal weakness.    Objective: Vitals: Blood pressure (!) 135/94, pulse 90, temperature 97.7 F (36.5 C), temperature source Oral, resp. rate 18, weight 55.7 kg, SpO2 100%.   Exam:  Awake Alert, No new F.N deficits, Normal affect [name],PERRAL Supple Neck, No JVD,   Symmetrical Chest wall movement, Good air movement bilaterally, CTAB RRR,No Gallops, Rubs or new Murmurs,  +ve B.Sounds, Abd Soft, No tenderness,   No Cyanosis, Clubbing or edema    Assessment/Plan:  AKI on CKD stage IIIa AKI likely hemodynamically mediated in the setting of diarrhea/losartan  use.  UA without proteinuria-CT without any hydronephrosis. Creatinine improving with supportive care  No acute indications for HD Continue IV fluid hydration, renal function slightly improved on 11/09/2023. Avoid nephrotoxic agents Follow electrolytes.  Metabolic acidosis Secondary to diarrhea/AKI Bicarb levels have improved after improvement in diarrhea and improvement in renal function-Will cautiously continue with IV bicarb fluid  today-suspect this can be discontinued soon.  Hypokalemia and hypomagnesemia Replace  Acute on chronic diarrhea-likely secondary to enteropathogenic E. coli gastroenteritis in a patient with Crohn's Diarrhea seems to have slowed down-Will discuss with nursing staff to send stool studies if she has loose stools today. On empiric Rocephin/Lomotil  and as needed Bentyl .  Flagyl  stopped by GI on 11/08/2023. GI following Skyrizi  on hold  Normocytic anemia No evidence of blood loss-this is likely secondary to acute illness Follow CBC  HTN Losartan  remains on hold due to AKI BP is high increase Norvasc dose add as needed hydralazine   GERD PPI  GAD  Cymbalta  on hold-Due to AKI  Hard of hearing Has hearing aids at bedside  Code status:   Code Status: Full Code   DVT Prophylaxis: heparin  injection 5,000 Units Start: 11/05/23 0600 SCDs Start: 11/05/23 0114 Place TED hose Start: 11/05/23 0114   Family Communication: Spouse at bedside 10/30-none at bedside this morning.   Disposition Plan: Status is: Inpatient Remains inpatient appropriate because: Severity of illness   Planned Discharge Destination:Home   Diet: Diet Order             Diet renal with fluid restriction Fluid restriction: Other (see comments); Room service appropriate? Yes; Fluid consistency: Thin  Diet effective now                      MEDICATIONS: Scheduled Meds:  amLODipine  10 mg Oral Daily   diphenoxylate -atropine   2 tablet Oral TID   ferrous sulfate   325 mg Oral QHS   heparin   5,000 Units Subcutaneous Q8H   isosorbide mononitrate  15 mg  Oral Daily   pantoprazole   40 mg Oral Daily   sodium chloride  flush  3 mL Intravenous Q12H   sodium chloride  flush  3 mL Intravenous Q12H   Continuous Infusions:  cefTRIAXone (ROCEPHIN)  IV Stopped (11/07/23 2255)   sodium bicarbonate 150 mEq in sterile water  1,150 mL infusion 125 mL/hr at 11/09/23 0353   PRN Meds:.acetaminophen  **OR** acetaminophen ,  albuterol , bismuth subsalicylate, dicyclomine , hydrALAZINE , ondansetron  **OR** ondansetron  (ZOFRAN ) IV, prochlorperazine , sodium chloride  flush   I have personally reviewed following labs and imaging studies  LABORATORY DATA: CBC: Recent Labs  Lab 11/05/23 0501 11/06/23 0211 11/07/23 0307 11/08/23 0725 11/09/23 0358  WBC 17.4* 8.6 9.0 10.1 13.3*  NEUTROABS  --  5.3  --  7.2 11.3*  HGB 10.7* 9.4* 9.7* 9.6* 10.1*  HCT 34.6* 27.4* 28.2* 28.1* 29.1*  MCV 90.1 85.9 83.9 84.9 85.3  PLT 341 329 336 316 340    Basic Metabolic Panel: Recent Labs  Lab 11/04/23 2217 11/05/23 0138 11/05/23 0501 11/06/23 0211 11/07/23 0307 11/08/23 0725 11/09/23 0358  NA  --    < > 129* 134* 136 141 138  K  --    < > 5.2* 4.3 3.8 3.3* 4.0  CL  --    < > 100 103 97* 89* 86*  CO2  --    < > 14* 16* 22 28 32  GLUCOSE  --    < > 86 100* 91 70 101*  BUN  --    < > 111* 116* 92* 62* 41*  CREATININE  --    < > 7.67* 6.96* 4.47* 2.94* 2.45*  CALCIUM   --    < > 11.9* 8.6* 8.2* 7.7* 7.7*  MG 2.1  --   --  1.7  --  1.2* 2.0  PHOS  --   --   --   --  2.9 3.4 2.6   < > = values in this interval not displayed.    GFR: Estimated Creatinine Clearance: 14.8 mL/min (A) (by C-G formula based on SCr of 2.45 mg/dL (H)).  Liver Function Tests: Recent Labs  Lab 11/04/23 2209 11/05/23 0501 11/06/23 0211 11/07/23 0307  AST 24 13* 13*  --   ALT 11 10 11   --   ALKPHOS 97 93 62  --   BILITOT 0.3 0.3 0.8  --   PROT 7.6 7.3 6.0*  --   ALBUMIN 3.6 3.6 2.6* 2.6*   Recent Labs  Lab 11/04/23 2209  LIPASE 58*   No results for input(s): AMMONIA in the last 168 hours.  Coagulation Profile: No results for input(s): INR, PROTIME in the last 168 hours.  Cardiac Enzymes: No results for input(s): CKTOTAL, CKMB, CKMBINDEX, TROPONINI in the last 168 hours.  BNP (last 3 results) No results for input(s): PROBNP in the last 8760 hours.  Lipid Profile: No results for input(s): CHOL, HDL,  LDLCALC, TRIG, CHOLHDL, LDLDIRECT in the last 72 hours.  Thyroid  Function Tests: No results for input(s): TSH, T4TOTAL, FREET4, T3FREE, THYROIDAB in the last 72 hours.  Anemia Panel: No results for input(s): VITAMINB12, FOLATE, FERRITIN, TIBC, IRON, RETICCTPCT in the last 72 hours.  Urine analysis:    Component Value Date/Time   COLORURINE YELLOW 11/05/2023 1643   APPEARANCEUR HAZY (A) 11/05/2023 1643   LABSPEC 1.015 11/05/2023 1643   PHURINE 5.0 11/05/2023 1643   GLUCOSEU NEGATIVE 11/05/2023 1643   GLUCOSEU NEGATIVE 02/16/2019 1534   HGBUR NEGATIVE 11/05/2023 1643   BILIRUBINUR NEGATIVE 11/05/2023 1643   BILIRUBINUR  neg 10/29/2023 1652   KETONESUR NEGATIVE 11/05/2023 1643   PROTEINUR NEGATIVE 11/05/2023 1643   UROBILINOGEN 0.2 10/29/2023 1652   UROBILINOGEN 0.2 02/16/2019 1534   NITRITE NEGATIVE 11/05/2023 1643   LEUKOCYTESUR MODERATE (A) 11/05/2023 1643    Sepsis Labs: Lactic Acid, Venous    Component Value Date/Time   LATICACIDVEN 1.4 05/02/2021 1522    MICROBIOLOGY: Recent Results (from the past 240 hours)  Urine Culture     Status: None   Collection Time: 10/30/23  1:14 PM   Specimen: Urine  Result Value Ref Range Status   MICRO NUMBER: [id number]  Final   SPECIMEN QUALITY: Adequate  Final   Sample Source URINE  Final   STATUS: FINAL  Final   Result:   Final    Mixed genital flora isolated. These superficial bacteria are not indicative of a urinary tract infection. No further organism identification is warranted on this specimen. If clinically indicated, recollect clean-catch, mid-stream urine and transfer  immediately to Urine Culture Transport Tube.   Blood culture (routine x 2)     Status: None (Preliminary result)   Collection Time: 11/05/23  1:06 AM   Specimen: BLOOD LEFT FOREARM  Result Value Ref Range Status   Specimen Description   Final    BLOOD LEFT FOREARM Performed at [facility], [address],  [place], [place] [address]    Special Requests   Final    Blood Culture results may not be optimal due to an inadequate volume of blood received in culture bottles BOTTLES DRAWN AEROBIC AND ANAEROBIC Performed at [facility], [address], [place], [place] [address]    Culture   Final    NO GROWTH 3 DAYS Performed at [facility], [address], [place], [place] [address]    Report Status PENDING  Incomplete  Gastrointestinal Panel by PCR , Stool     Status: Abnormal   Collection Time: 11/05/23  5:56 PM   Specimen: STOOL  Result Value Ref Range Status   Campylobacter species NOT DETECTED NOT DETECTED Final   Plesimonas shigelloides NOT DETECTED NOT DETECTED Final   Salmonella species NOT DETECTED NOT DETECTED Final   Yersinia enterocolitica NOT DETECTED NOT DETECTED Final   Vibrio species NOT DETECTED NOT DETECTED Final   Vibrio cholerae NOT DETECTED NOT DETECTED Final   Enteroaggregative E coli (EAEC) NOT DETECTED NOT DETECTED Final   Enteropathogenic E coli (EPEC) DETECTED (A) NOT DETECTED Final    Comment: RESULT CALLED TO, READ BACK BY AND VERIFIED WITH:  KATHERINE B., RN AT 1651 11/08/23 RAM    Enterotoxigenic E coli (ETEC) NOT DETECTED NOT DETECTED Final   Shiga like toxin producing E coli (STEC) NOT DETECTED NOT DETECTED Final   Shigella/Enteroinvasive E coli (EIEC) NOT DETECTED NOT DETECTED Final   Cryptosporidium NOT DETECTED NOT DETECTED Final   Cyclospora cayetanensis NOT DETECTED NOT DETECTED Final   Entamoeba histolytica NOT DETECTED NOT DETECTED Final   Giardia lamblia NOT DETECTED NOT DETECTED Final   Adenovirus F40/41 NOT DETECTED NOT DETECTED Final   Astrovirus NOT DETECTED NOT DETECTED Final   Norovirus GI/GII NOT DETECTED NOT DETECTED Final   Rotavirus A NOT DETECTED NOT DETECTED Final   Sapovirus (I, II, IV, and V) NOT DETECTED NOT DETECTED Final    Comment: Performed at [facility], [address] [place], [place] [address]   Blood culture (routine x 2)     Status: None (Preliminary result)   Collection  Time: 11/05/23  9:04 PM   Specimen: BLOOD  Result Value Ref Range Status   Specimen Description BLOOD SITE NOT SPECIFIED  Final   Special Requests   Final    BOTTLES DRAWN AEROBIC AND ANAEROBIC Blood Culture adequate volume   Culture   Final    NO GROWTH 3 DAYS Performed at [facility], [address] [place], [place] [address]    Report Status PENDING  Incomplete  C Difficile Quick Screen w PCR reflex     Status: None   Collection Time: 11/06/23  6:31 AM   Specimen: STOOL  Result Value Ref Range Status   C Diff antigen NEGATIVE NEGATIVE Final   C Diff toxin NEGATIVE NEGATIVE Final   C Diff interpretation No C. difficile detected.  Final    Comment: Performed at [facility], [address], [place], [place] [address]    RADIOLOGY STUDIES/RESULTS: No results found.    LOS: 4 days   Signature  -    Lavada Stank M.D on 11/09/2023 at 9:06 AM   -  To page go to [email or web address]

## 2023-11-09 NOTE — Progress Notes (Addendum)
 Progress Note   LOS: 4 days   Chief Complaint: Diarrhea   Subjective   Patient has had no further diarrhea.  Still having dry heaves but no emesis.  Her last dose of Zofran  was last night at 9 PM.   Objective   Vital signs in last 24 hours: Temp:  [97.7 F (36.5 C)-98.2 F (36.8 C)] 97.7 F (36.5 C) (11/02 0855) Pulse Rate:  [87-105] 90 (11/02 0453) Resp:  [18-20] 18 (11/02 0855) BP: (135-178)/(86-143) 135/94 (11/02 0855) SpO2:  [91 %-100 %] 100 % (11/02 0453) Weight:  [55.7 kg] 55.7 kg (11/02 0453) Last BM Date : 11/07/23 Last BM recorded by nurses in past 5 days No data recorded  General:   female in no acute distress  Heart:  Regular rate and rhythm; no murmurs Pulm: Clear anteriorly; no wheezing Abdomen: soft, nondistended, normal bowel sounds in all quadrants. Nontender without guarding. No organomegaly appreciated. Extremities:  No edema Neurologic:  Alert and  oriented x4;  No focal deficits.  Psych:  Cooperative. Normal mood and affect.  Intake/Output from previous day: 11/01 0701 - 11/02 0700 In: 1115.3 [P.O.:480; I.V.:612.6; IV Piggyback:22.7] Out: 300 [Urine:300] Intake/Output this shift: No intake/output data recorded.  Studies/Results: No results found.  Lab Results: Recent Labs    11/07/23 0307 11/08/23 0725 11/09/23 0358  WBC 9.0 10.1 13.3*  HGB 9.7* 9.6* 10.1*  HCT 28.2* 28.1* 29.1*  PLT 336 316 340   BMET Recent Labs    11/07/23 0307 11/08/23 0725 11/09/23 0358  NA 136 141 138  K 3.8 3.3* 4.0  CL 97* 89* 86*  CO2 22 28 32  GLUCOSE 91 70 101*  BUN 92* 62* 41*  CREATININE 4.47* 2.94* 2.45*  CALCIUM  8.2* 7.7* 7.7*   LFT Recent Labs    11/07/23 0307  ALBUMIN 2.6*   PT/INR No results for input(s): LABPROT, INR in the last 72 hours.   Scheduled Meds:  amLODipine  10 mg Oral Daily   diphenoxylate -atropine   2 tablet Oral TID   ferrous sulfate   325 mg Oral QHS   heparin   5,000 Units Subcutaneous Q8H   isosorbide  mononitrate  15 mg Oral Daily   pantoprazole   40 mg Oral Daily   sodium chloride  flush  3 mL Intravenous Q12H   sodium chloride  flush  3 mL Intravenous Q12H   Continuous Infusions:  cefTRIAXone (ROCEPHIN)  IV Stopped (11/07/23 2255)   sodium bicarbonate 150 mEq in sterile water  1,150 mL infusion 125 mL/hr at 11/09/23 0353      Patient profile:   78 y.o. 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   Admitted with acute on chronic diarrhea and AKI  Patient was initially admitted t to [facility] on 11/01/23.she had significant AKI with metabolic acidosis.  She was seen by nephrology and subsequently transferred to [facility] . CT scan showing active colitis involving the transverse and descending colon.  Patient convinced that worsening diarrhea is secondary to Skyrizi  (recently initiated).  Other etiologies of  worsening diarrhea are under investigation (inpatient and outpatient).  She also feels diarrhea related to her history of SIBO  ESR 30/CRP 4.1 Stool studies positive for E coli (EPEC), negative C diff, fecal calprotectin pending TODAY: No stools in the last 24 hours.  Patient was having severe nausea and vomiting yesterday and Flagyl  was discontinued as her symptoms were thought to be side effect since they were new onset.  Continuing to have dry heaves and appears to have not been receiving her scheduled Zofran  as last dose  was last night at 9 PM..  She remains on Rocephin.  history of SIBO Generally takes Augmentin  which she finds helpful   AKI on CKD 3B AKI in setting of diarrhea  /volume depletion . Patient was initially admitted to [facility].  Seen by our inpatient team there yesterday but transferred to [facility] with AKI /metabolic acidosis.  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 I performed a substantive portion of this encounter, including complete performance of at least one of the key components, in conjunction with the APP. I agree with the APP's note, impression and recommendations  Diarrhea much improved.  No bowel movements today.  Continues to have nausea, but no vomiting.  Tolerated some solid food today for the first time in a few days.  Renal function continues to improve.  Suspect patient's severe diarrhea was attributable to the E. coli infection, given the stool test results and the rapid improvement with empiric antibiotics.  I do not think any treatment for Crohn's flare with steroids is necessary at this time.  The patient remains adamant that she is not going to continue Skyrizi , despite the fact that we identified a bacteria as the more  likely culprit of her diarrhea.  We discussed how the Skyrizi  might have made her response to E. coli infection much more severe than typical, but it was unlikely  that Skyrizi  caused her diarrhea.  She will need to discuss other maintenance options for her Crohn's colitis with Dr. Suzann as an outpatient.  Recommend we stop her Rocephin tomorrow and see how her diarrhea does.  Hopefully her nausea and p.o. intake wil continue to improve tomorrow  Dr. Albertus will be taking over inpatient GI coverage tomorrow.   April Pichon E. Stacia, MD [facility]

## 2023-11-10 DIAGNOSIS — K5010 Crohn's disease of large intestine without complications: Secondary | ICD-10-CM | POA: Diagnosis not present

## 2023-11-10 DIAGNOSIS — N179 Acute kidney failure, unspecified: Secondary | ICD-10-CM | POA: Diagnosis not present

## 2023-11-10 DIAGNOSIS — A040 Enteropathogenic Escherichia coli infection: Secondary | ICD-10-CM | POA: Diagnosis not present

## 2023-11-10 DIAGNOSIS — K529 Noninfective gastroenteritis and colitis, unspecified: Secondary | ICD-10-CM

## 2023-11-10 LAB — CULTURE, BLOOD (ROUTINE X 2)
Culture: NO GROWTH
Culture: NO GROWTH
Special Requests: ADEQUATE

## 2023-11-10 LAB — MAGNESIUM: Magnesium: 1.4 mg/dL — ABNORMAL LOW (ref 1.7–2.4)

## 2023-11-10 LAB — CBC WITH DIFFERENTIAL/PLATELET
Abs Immature Granulocytes: 0.08 K/uL — ABNORMAL HIGH (ref 0.00–0.07)
Basophils Absolute: 0.1 K/uL (ref 0.0–0.1)
Basophils Relative: 0 %
Eosinophils Absolute: 0.5 K/uL (ref 0.0–0.5)
Eosinophils Relative: 3 %
HCT: 29.6 % — ABNORMAL LOW (ref 36.0–46.0)
Hemoglobin: 9.6 g/dL — ABNORMAL LOW (ref 12.0–15.0)
Immature Granulocytes: 1 %
Lymphocytes Relative: 14 %
Lymphs Abs: 2 K/uL (ref 0.7–4.0)
MCH: 28.7 pg (ref 26.0–34.0)
MCHC: 32.4 g/dL (ref 30.0–36.0)
MCV: 88.6 fL (ref 80.0–100.0)
Monocytes Absolute: 1 K/uL (ref 0.1–1.0)
Monocytes Relative: 7 %
Neutro Abs: 10.5 K/uL — ABNORMAL HIGH (ref 1.7–7.7)
Neutrophils Relative %: 75 %
Platelets: 281 K/uL (ref 150–400)
RBC: 3.34 MIL/uL — ABNORMAL LOW (ref 3.87–5.11)
RDW: 14.6 % (ref 11.5–15.5)
WBC: 14.1 K/uL — ABNORMAL HIGH (ref 4.0–10.5)
nRBC: 0 % (ref 0.0–0.2)

## 2023-11-10 LAB — BASIC METABOLIC PANEL WITH GFR
Anion gap: 15 (ref 5–15)
BUN: 23 mg/dL (ref 8–23)
CO2: 40 mmol/L — ABNORMAL HIGH (ref 22–32)
Calcium: 7.5 mg/dL — ABNORMAL LOW (ref 8.9–10.3)
Chloride: 80 mmol/L — ABNORMAL LOW (ref 98–111)
Creatinine, Ser: 1.93 mg/dL — ABNORMAL HIGH (ref 0.44–1.00)
GFR, Estimated: 26 mL/min — ABNORMAL LOW (ref >60)
Glucose, Bld: 106 mg/dL — ABNORMAL HIGH (ref 70–99)
Potassium: 3.5 mmol/L (ref 3.5–5.1)
Sodium: 135 mmol/L (ref 135–145)

## 2023-11-10 LAB — PHOSPHORUS: Phosphorus: 1.9 mg/dL — ABNORMAL LOW (ref 2.5–4.6)

## 2023-11-10 MED ORDER — MAGNESIUM SULFATE 4 GM/100ML IV SOLN
4.0000 g | Freq: Once | INTRAVENOUS | Status: AC
  Administered 2023-11-10: 4 g via INTRAVENOUS

## 2023-11-10 MED ORDER — POTASSIUM PHOSPHATES 15 MMOLE/5ML IV SOLN
30.0000 mmol | Freq: Once | INTRAVENOUS | Status: DC
  Administered 2023-11-10: 30 mmol via INTRAVENOUS
  Filled 2023-11-10: qty 10

## 2023-11-10 MED ORDER — POTASSIUM CHLORIDE 20 MEQ PO PACK: 40.0000 meq | PACK | Freq: Once | ORAL | Status: DC

## 2023-11-10 MED ORDER — MELATONIN 5 MG PO TABS
5.0000 mg | ORAL_TABLET | Freq: Every evening | ORAL | Status: DC | PRN
Start: 2023-11-10 — End: 2023-11-12
  Administered 2023-11-10: 5 mg via ORAL
  Filled 2023-11-10: qty 1

## 2023-11-10 MED ORDER — K PHOS MONO-SOD PHOS DI & MONO 155-852-130 MG PO TABS
500.0000 mg | ORAL_TABLET | Freq: Two times a day (BID) | ORAL | Status: AC
  Administered 2023-11-10 (×2): 500 mg via ORAL
  Filled 2023-11-10 (×2): qty 2

## 2023-11-10 NOTE — Plan of Care (Signed)

## 2023-11-10 NOTE — Progress Notes (Signed)
 Physical Therapy Treatment Patient Details Name: [name] MRN: [id number] DOB: 08-Mar-1945 Today's Date: 11/10/2023   History of Present Illness 78 y.o. female presents to [facility]/04/23 with for diarrhea. Found to have AKI/acute metabolic acidosis and hyperkalemia. PMHx: Crohn's disease, symptomatic paraesophageal hernia, chronic pancreatic cyst, Schatzki ring, IBS, GERD, essential hypertension, CKD 3A, rheumatoid arthrits    PT Comments  Pt seen for PT tx with pt agreeable, spouse present. Pt is able to ambulate into hallway with RW & supervision, perform 5x sit<>stand x 2 reps without BUE support for BLE strengthening. Pt with inconsistent pleth waveform, SpO2 reading as low 72% at times without c/o SOB & no apparent distress. Pt educated on pursed lip breathing, readings up to 95%. Pt left sitting EOB with spouse present, on room air, nurse made aware of O2 readings during session.    If plan is discharge home, recommend the following: Help with stairs or ramp for entrance;Assist for transportation   Can travel by private vehicle        Equipment Recommendations  Rolling walker (2 wheels)    Recommendations for Other Services       Precautions / Restrictions Precautions Precautions: Fall Restrictions Weight Bearing Restrictions Per Provider Order: No     Mobility  Bed Mobility               General bed mobility comments: not tested, pt received & left sitting EOB    Transfers Overall transfer level: Needs assistance Equipment used: Rolling walker (2 wheels) Transfers: Sit to/from Stand Sit to Stand: Supervision           General transfer comment: decreased awareness of safe hand placement during sit>stand with RW    Ambulation/Gait Ambulation/Gait assistance: Supervision Gait Distance (Feet): 55 Feet Assistive device: Rolling walker (2 wheels) Gait Pattern/deviations: Decreased step length - right, Decreased step length - left, Decreased stride  length Gait velocity: decreased         Stairs             Wheelchair Mobility     Tilt Bed    Modified Rankin (Stroke Patients Only)       Balance Overall balance assessment: Needs assistance Sitting-balance support: Feet supported Sitting balance-Leahy Scale: Good     Standing balance support: During functional activity, Bilateral upper extremity supported, Reliant on assistive device for balance Standing balance-Leahy Scale: Fair                              Hotel Manager: Impaired Factors Affecting Communication: Hearing impaired  Cognition Arousal: Alert Behavior During Therapy: WFL for tasks assessed/performed   PT - Cognitive impairments: No apparent impairments                         Following commands: Intact      Cueing Cueing Techniques: Verbal cues  Exercises Other Exercises Other Exercises: Pt performed 5x sit<>stand x 2 sets without BUE support with focus on BLE strengthening; cuing to scoot out to EOB to prevent posterior lean onto EOB with BLE. Other Exercises: Educated pt on pursed lip breathing with good return demo.    General Comments        Pertinent Vitals/Pain Pain Assessment Pain Assessment: No/denies pain    Home Living  Prior Function            PT Goals (current goals can now be found in the care plan section) Acute Rehab PT Goals Patient Stated Goal: to feel better and go home PT Goal Formulation: With patient Time For Goal Achievement: 11/21/23 Potential to Achieve Goals: Good Progress towards PT goals: Progressing toward goals    Frequency    Min 2X/week      PT Plan      Co-evaluation              AM-PAC PT 6 Clicks Mobility   Outcome Measure  Help needed turning from your back to your side while in a flat bed without using bedrails?: None Help needed moving from lying on your back to sitting on the  side of a flat bed without using bedrails?: None Help needed moving to and from a bed to a chair (including a wheelchair)?: A Little Help needed standing up from a chair using your arms (e.g., wheelchair or bedside chair)?: A Little Help needed to walk in hospital room?: A Little Help needed climbing 3-5 steps with a railing? : A Little 6 Click Score: 20    End of Session   Activity Tolerance: Patient tolerated treatment well Patient left: in bed;with call bell/phone within reach;with family/visitor present Nurse Communication: Mobility status PT Visit Diagnosis: Other abnormalities of gait and mobility (R26.89);Muscle weakness (generalized) (M62.81)     Time: 8488-8461 PT Time Calculation (min) (ACUTE ONLY): 27 min  Charges:    $Therapeutic Activity: 23-37 mins PT General Charges $$ ACUTE PT VISIT: 1 Visit                     Richerd Pinal, PT, DPT 11/10/23, 3:56 PM   Richerd CHRISTELLA Pinal 11/10/2023, 3:54 PM

## 2023-11-10 NOTE — Progress Notes (Signed)
 PROGRESS NOTE        PATIENT DETAILS Name: [name] Age: 78 y.o. Sex: female Date of Birth: [birth year] Admit Date: 11/04/2023 Admitting Physician Micaela Speaker, MD ERE:Yzmwjwizs [name] GRADE, MD  Brief Summary: Patient is a 78 y.o.  female with history of chron's disease, HTN-who presented with diarrhea/weakness-found to have AKI and subsequently admitted to the hospitalist service.  Significant events: 10/29>> admit to [facility]  Significant studies: 10/28>> CT abdomen/pelvis: Active colitis-transverse/descending colon.  Significant microbiology data: 10/29>> blood culture: No growth  Procedures: None  Consults: GI Nephrology  Subjective:  Patient in bed, appears comfortable, denies any headache, no fever, no chest pain or pressure, no shortness of breath , no abdominal pain. No new focal weakness.  Objective: Vitals: Blood pressure (!) 156/79, pulse 75, temperature 98.2 F (36.8 C), temperature source Oral, resp. rate 18, weight 56.1 kg, SpO2 97%.   Exam:  Awake Alert, No new F.N deficits, Normal affect [place].AT,PERRAL Supple Neck, No JVD,   Symmetrical Chest wall movement, Good air movement bilaterally, CTAB RRR,No Gallops, Rubs or new Murmurs,  +ve B.Sounds, Abd Soft, No tenderness,   No Cyanosis, Clubbing or edema    Assessment/Plan:  AKI on CKD stage IIIa AKI likely hemodynamically mediated in the setting of diarrhea/losartan  use.  UA without proteinuria-CT without any hydronephrosis. Creatinine improving with supportive care  No acute indications for HD Continue IV fluid hydration, renal function slightly improved on 11/09/2023. Avoid nephrotoxic agents Follow electrolytes.  Metabolic acidosis Secondary to diarrhea/AKI Bicarb levels have improved after improvement in diarrhea and improvement in renal function-Will cautiously continue with IV bicarb fluid today-suspect this can be discontinued soon.  Hypokalemia,  hypophosphatemia and hypomagnesemia Replace  Acute on chronic diarrhea-likely secondary to enteropathogenic E. coli gastroenteritis in a patient with Crohn's Diarrhea seems to have slowed down-Will discuss with nursing staff to send stool studies if she has loose stools today. On empiric Rocephin/Lomotil  and as needed Bentyl .  Flagyl  stopped by GI on 11/08/2023. GI following, diarrhea has improved along with improvement in nausea on 11/10/2023. Skyrizi  on hold  Normocytic anemia No evidence of blood loss-this is likely secondary to acute illness Follow CBC  HTN Losartan  remains on hold due to AKI BP is high increase Norvasc dose add as needed hydralazine   GERD PPI  GAD  Cymbalta  on hold-Due to AKI  Hard of hearing Has hearing aids at bedside  Code status:   Code Status: Full Code   DVT Prophylaxis: heparin  injection 5,000 Units Start: 11/05/23 0600 SCDs Start: 11/05/23 0114 Place TED hose Start: 11/05/23 0114   Family Communication: Spouse at bedside 10/30-none at bedside this morning.   Disposition Plan: Status is: Inpatient Remains inpatient appropriate because: Severity of illness   Planned Discharge Destination:Home   Diet: Diet Order             Diet renal with fluid restriction Fluid restriction: Other (see comments); Room service appropriate? Yes; Fluid consistency: Thin  Diet effective now                      MEDICATIONS: Scheduled Meds:  amLODipine  10 mg Oral Daily   diphenoxylate -atropine   2 tablet Oral TID   ferrous sulfate   325 mg Oral QHS   heparin   5,000 Units Subcutaneous Q8H   isosorbide mononitrate  15 mg Oral Daily  pantoprazole   40 mg Oral Daily   potassium chloride  40 mEq Oral Once   sodium chloride  flush  3 mL Intravenous Q12H   sodium chloride  flush  3 mL Intravenous Q12H   Continuous Infusions:  cefTRIAXone (ROCEPHIN)  IV 2 g (11/09/23 2156)   magnesium  sulfate bolus IVPB     potassium PHOSPHATE IVPB (in mmol)      sodium bicarbonate 150 mEq in sterile water  1,150 mL infusion 125 mL/hr at 11/10/23 0022   PRN Meds:.acetaminophen  **OR** acetaminophen , albuterol , bismuth subsalicylate, dicyclomine , hydrALAZINE , ondansetron  **OR** ondansetron  (ZOFRAN ) IV, prochlorperazine , sodium chloride  flush   I have personally reviewed following labs and imaging studies  LABORATORY DATA: CBC: Recent Labs  Lab 11/06/23 0211 11/07/23 0307 11/08/23 0725 11/09/23 0358 11/10/23 0346  WBC 8.6 9.0 10.1 13.3* 14.1*  NEUTROABS 5.3  --  7.2 11.3* 10.5*  HGB 9.4* 9.7* 9.6* 10.1* 9.6*  HCT 27.4* 28.2* 28.1* 29.1* 29.6*  MCV 85.9 83.9 84.9 85.3 88.6  PLT 329 336 316 340 281    Basic Metabolic Panel: Recent Labs  Lab 11/04/23 2217 11/05/23 0138 11/06/23 0211 11/07/23 0307 11/08/23 0725 11/09/23 0358 11/10/23 0346  NA  --    < > 134* 136 141 138 135  K  --    < > 4.3 3.8 3.3* 4.0 3.5  CL  --    < > 103 97* 89* 86* 80*  CO2  --    < > 16* 22 28 32 40*  GLUCOSE  --    < > 100* 91 70 101* 106*  BUN  --    < > 116* 92* 62* 41* 23  CREATININE  --    < > 6.96* 4.47* 2.94* 2.45* 1.93*  CALCIUM   --    < > 8.6* 8.2* 7.7* 7.7* 7.5*  MG 2.1  --  1.7  --  1.2* 2.0 1.4*  PHOS  --   --   --  2.9 3.4 2.6 1.9*   < > = values in this interval not displayed.    GFR: Estimated Creatinine Clearance: 18.8 mL/min (A) (by C-G formula based on SCr of 1.93 mg/dL (H)).  Liver Function Tests: Recent Labs  Lab 11/04/23 2209 11/05/23 0501 11/06/23 0211 11/07/23 0307  AST 24 13* 13*  --   ALT 11 10 11   --   ALKPHOS 97 93 62  --   BILITOT 0.3 0.3 0.8  --   PROT 7.6 7.3 6.0*  --   ALBUMIN 3.6 3.6 2.6* 2.6*   Recent Labs  Lab 11/04/23 2209  LIPASE 58*   No results for input(s): AMMONIA in the last 168 hours.  Coagulation Profile: No results for input(s): INR, PROTIME in the last 168 hours.  Cardiac Enzymes: No results for input(s): CKTOTAL, CKMB, CKMBINDEX, TROPONINI in the last 168 hours.  BNP (last  3 results) No results for input(s): PROBNP in the last 8760 hours.  Lipid Profile: No results for input(s): CHOL, HDL, LDLCALC, TRIG, CHOLHDL, LDLDIRECT in the last 72 hours.  Thyroid  Function Tests: No results for input(s): TSH, T4TOTAL, FREET4, T3FREE, THYROIDAB in the last 72 hours.  Anemia Panel: No results for input(s): VITAMINB12, FOLATE, FERRITIN, TIBC, IRON, RETICCTPCT in the last 72 hours.  Urine analysis:    Component Value Date/Time   COLORURINE YELLOW 11/05/2023 1643   APPEARANCEUR HAZY (A) 11/05/2023 1643   LABSPEC 1.015 11/05/2023 1643   PHURINE 5.0 11/05/2023 1643   GLUCOSEU NEGATIVE 11/05/2023 1643   GLUCOSEU NEGATIVE  02/16/2019 1534   HGBUR NEGATIVE 11/05/2023 1643   BILIRUBINUR NEGATIVE 11/05/2023 1643   BILIRUBINUR neg 10/29/2023 1652   KETONESUR NEGATIVE 11/05/2023 1643   PROTEINUR NEGATIVE 11/05/2023 1643   UROBILINOGEN 0.2 10/29/2023 1652   UROBILINOGEN 0.2 02/16/2019 1534   NITRITE NEGATIVE 11/05/2023 1643   LEUKOCYTESUR MODERATE (A) 11/05/2023 1643    Sepsis Labs: Lactic Acid, Venous    Component Value Date/Time   LATICACIDVEN 1.4 05/02/2021 1522    MICROBIOLOGY: Recent Results (from the past 240 hours)  Blood culture (routine x 2)     Status: None   Collection Time: 11/05/23  1:06 AM   Specimen: BLOOD LEFT FOREARM  Result Value Ref Range Status   Specimen Description   Final    BLOOD LEFT FOREARM Performed at [facility], [address] [place], [place] [address]    Special Requests   Final    Blood Culture results may not be optimal due to an inadequate volume of blood received in culture bottles BOTTLES DRAWN AEROBIC AND ANAEROBIC Performed at [facility], [address], [place], [place] [address]    Culture   Final    NO GROWTH 5 DAYS Performed at [facility], [address], [place], [place] [address]    Report Status 11/10/2023 FINAL  Final  Gastrointestinal  Panel by PCR , Stool     Status: Abnormal   Collection Time: 11/05/23  5:56 PM   Specimen: STOOL  Result Value Ref Range Status   Campylobacter species NOT DETECTED NOT DETECTED Final   Plesimonas shigelloides NOT DETECTED NOT DETECTED Final   Salmonella species NOT DETECTED NOT DETECTED Final   Yersinia enterocolitica NOT DETECTED NOT DETECTED Final   Vibrio species NOT DETECTED NOT DETECTED Final   Vibrio cholerae NOT DETECTED NOT DETECTED Final   Enteroaggregative E coli (EAEC) NOT DETECTED NOT DETECTED Final   Enteropathogenic E coli (EPEC) DETECTED (A) NOT DETECTED Final    Comment: RESULT CALLED TO, READ BACK BY AND VERIFIED WITH:  KATHERINE B., RN AT 1651 11/08/23 RAM    Enterotoxigenic E coli (ETEC) NOT DETECTED NOT DETECTED Final   Shiga like toxin producing E coli (STEC) NOT DETECTED NOT DETECTED Final   Shigella/Enteroinvasive E coli (EIEC) NOT DETECTED NOT DETECTED Final   Cryptosporidium NOT DETECTED NOT DETECTED Final   Cyclospora cayetanensis NOT DETECTED NOT DETECTED Final   Entamoeba histolytica NOT DETECTED NOT DETECTED Final   Giardia lamblia NOT DETECTED NOT DETECTED Final   Adenovirus F40/41 NOT DETECTED NOT DETECTED Final   Astrovirus NOT DETECTED NOT DETECTED Final   Norovirus GI/GII NOT DETECTED NOT DETECTED Final   Rotavirus A NOT DETECTED NOT DETECTED Final   Sapovirus (I, II, IV, and V) NOT DETECTED NOT DETECTED Final    Comment: Performed at [facility], [address] [place], [place] [address]  Blood culture (routine x 2)     Status: None   Collection Time: 11/05/23  9:04 PM   Specimen: BLOOD  Result Value Ref Range Status   Specimen Description BLOOD SITE NOT SPECIFIED  Final   Special Requests   Final    BOTTLES DRAWN AEROBIC AND ANAEROBIC Blood Culture adequate volume   Culture   Final    NO GROWTH 5 DAYS Performed at [facility], [address], [place], [place] [address]    Report Status 11/10/2023 FINAL  Final  C Difficile  Quick Screen w PCR reflex  Status: None   Collection Time: 11/06/23  6:31 AM   Specimen: STOOL  Result Value Ref Range Status   C Diff antigen NEGATIVE NEGATIVE Final   C Diff toxin NEGATIVE NEGATIVE Final   C Diff interpretation No C. difficile detected.  Final    Comment: Performed at [facility], [address] [place], [place] [address]    RADIOLOGY STUDIES/RESULTS: No results found.    LOS: 5 days   Signature  -    Lavada Stank M.D on 11/10/2023 at 8:23 AM   -  To page go to [email or web address]

## 2023-11-10 NOTE — Progress Notes (Signed)
 [facility] Progress Note  CC: Diarrhea, Crohn's disease  Subjective: She has mild nausea without vomiting.  No abdominal pain but she endorses feeling bloated.  She passed 1 bowel movement earlier this morning, she did not look at it and could not determine if she passed a loose or solid stool.  Husband at the bedside.   Objective:  Vital signs in last 24 hours: Temp:  [97.7 F (36.5 C)-98.2 F (36.8 C)] 98.2 F (36.8 C) (11/03 0741) Pulse Rate:  [75-76] 75 (11/03 0428) Resp:  [18] 18 (11/03 0741) BP: (142-177)/(68-127) 156/79 (11/03 0428) SpO2:  [97 %] 97 % (11/02 2000) Weight:  [56.1 kg] 56.1 kg (11/03 0500) Last BM Date : 11/07/23 General: Alert 78 year old female in no acute distress. Heart: Regular rate and rhythm, no murmurs. Pulm: Breath sounds clear throughout.  On oxygen 2 L nasal cannula. Abdomen: Soft, nondistended.  Nontender.  Positive bowel sounds to all 4 quadrants.  No palpable mass.  No bruit. Extremities: No lower extremity edema. Neurologic:  Alert and oriented x 4.  Speech is clear.  Moves all extremities equally. Psych:  Alert and cooperative. Normal mood and affect.  Intake/Output from previous day: 11/02 0701 - 11/03 0700 In: 240 [P.O.:240] Out: -  Intake/Output this shift: No intake/output data recorded.  Lab Results: Recent Labs    11/08/23 0725 11/09/23 0358 11/10/23 0346  WBC 10.1 13.3* 14.1*  HGB 9.6* 10.1* 9.6*  HCT 28.1* 29.1* 29.6*  PLT 316 340 281   BMET Recent Labs    11/08/23 0725 11/09/23 0358 11/10/23 0346  NA 141 138 135  K 3.3* 4.0 3.5  CL 89* 86* 80*  CO2 28 32 40*  GLUCOSE 70 101* 106*  BUN 62* 41* 23  CREATININE 2.94* 2.45* 1.93*  CALCIUM  7.7* 7.7* 7.5*   LFT No results for input(s): PROT, ALBUMIN, AST, ALT, ALKPHOS, BILITOT, BILIDIR, IBILI in the last 72 hours. PT/INR No results for input(s): LABPROT, INR in the last 72 hours. Hepatitis Panel No results for input(s):  HEPBSAG, HCVAB, HEPAIGM, HEPBIGM in the last 72 hours.  No results found.  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 Lyndel 01/11/2023, IBS and Crohn's colitis initially diagnosed in 1997. Admitted 11/04/2023 with diarrhea  Assessment / Plan:  Crohn's disease admitted 11/04/2023 with worsening acute on chronic diarrhea. CT abdomen pelvis without contrast showed active colitis involving transverse and descending colon with mild circumferential wall thickening, pericolonic inflammatory stranding, and vascular engorgement.  GI pathogen panel positive for Enteropathogenic E. coli (EPEC). C. difficile toxin/antigen negative.  Fecal calprotectin level in process.  Sed rate 30.  CRP 4.1. On Ceftriaxone 2gm IV. Outpatient colonoscopy 07/2023 showed stenosis in proximal ascending colon biopsies negative for dysplasia but showed active inflammation. Recently started on Skyrizi , second dose administered one week prior to admission. Patient past one BM earlier today, no obvious diarrhea.  - Continue renal diet as tolerated  - Continue Lomotil  2 tabs 3 times daily - Continue Dicyclomine  20 mg p.o. 4 times daily as needed abdominal pain/cramping - Patient to follow-up with Dr. Suzann (primary GI) as an outpatient to determine alternative Crohn's treatment as patient does not wish to restart Skyrizi  - Stop Ceftriaxone after 5th dose today - Await further recommendations per Dr. Albertus  AKI on CKD stage  IIIa. Cr 2.45 -> 1.93.  - BMP in am  Iron deficiency anemia. Hg 10.1 -> 9.6.  On oral iron daily. No overt GI  bleeding.  - CBC in am  Leukocytosis. WBC 10.1 -> 13.3 -> 14.1. Afebrile.   Hypomagnesia. Magnesium  level 1.4.  - Magnesium  replacement per the hospitalist  Nausea, improved  after Flagyl  IV discontinued - Continue Ondansetron  4 mg p.o. or IV every 6 hours as needed  GERD - Continue Pantoprazole  40 mg daily - Consider switching to H2 blocker if diarrhea persist on PPI  Pancreatic cysts [place] IPMN  Rheumatoid arthritis  Coronary calcifications per CTAP     Principal Problem:   Acute renal failure Active Problems:   Paraesophageal hernia   IBS (irritable bowel syndrome)   Acute colitis   Generalized anxiety disorder   Crohn's disease of colon without complication (HCC)   Schatzki's ring   Hx of gastroesophageal reflux (GERD)   Hyperkalemia   History of rheumatoid arthritis   Hyponatremia   Essential hypertension   Diarrhea of presumed infectious origin   Enteritis, enteropathogenic E. coli     LOS: 5 days   [name]  11/10/2023, 1:40 PM

## 2023-11-10 NOTE — Care Management Important Message (Signed)
 Important Message  Patient Details  Name: [name] MRN: [id number] Date of Birth: 11/29/1945   Important Message Given:  Yes - Medicare IM     Claretta Deed 11/10/2023, 4:20 PM

## 2023-11-10 NOTE — Progress Notes (Signed)
 Occupational Therapy Treatment Patient Details Name: [name] MRN: [id number] DOB: 19-Sep-1945 Today's Date: 11/10/2023   History of present illness 78 y.o. female presents to [facility]/04[facility]23 with for diarrhea. Found to have AKI/acute metabolic acidosis and hyperkalemia. PMHx: Crohn's disease, symptomatic paraesophageal hernia, chronic pancreatic cyst, Schatzki ring, IBS, GERD, essential hypertension, CKD 3A, rheumatoid arthrits   OT comments  Patient needing closer to Min A for in room mobility/toileting and ADL completion from a sit to stand level.  Patient was closer to supervision on eval.  Patient with symptomatic dizziness this date and generally feeling weaker than last week.  OT will continue efforts in the acute setting to address deficits, and HH OT will be recommended at this point depending on progress.        If plan is discharge home, recommend the following:  Assist for transportation;A lot of help with walking and/or transfers;A little help with walking and/or transfers;Assistance with cooking/housework   Equipment Recommendations  None recommended by OT    Recommendations for Other Services      Precautions / Restrictions Precautions Precautions: Fall Recall of Precautions/Restrictions: Intact Restrictions Weight Bearing Restrictions Per Provider Order: No       Mobility Bed Mobility Overal bed mobility: Modified Independent               Patient Response: Cooperative  Transfers Overall transfer level: Needs assistance Equipment used: 1 person hand held assist Transfers: Sit to/from Stand Sit to Stand: Contact guard assist, Supervision     Step pivot transfers: Contact guard assist, Min assist           Balance Overall balance assessment: Needs assistance Sitting-balance support: Feet supported Sitting balance-Leahy Scale: Good     Standing balance support: Single extremity supported Standing balance-Leahy Scale: Fair Standing balance  comment: a little more unsteady today with dizziness.                           ADL either performed or assessed with clinical judgement   ADL       Grooming: Oral care;Standing;Sitting;Contact guard assist               Lower Body Dressing: Minimal assistance;Sit to/from stand   Toilet Transfer: Contact guard assist;Minimal assistance;Ambulation;Regular Toilet                  Extremity/Trunk Assessment Upper Extremity Assessment Upper Extremity Assessment: Overall WFL for tasks assessed   Lower Extremity Assessment Lower Extremity Assessment: Defer to PT evaluation   Cervical / Trunk Assessment Cervical / Trunk Assessment: Normal    Vision Baseline Vision/History: 1 Wears glasses Patient Visual Report: No change from baseline     Perception Perception Perception: Not tested   Praxis Praxis Praxis: Not tested   Communication Communication Communication: No apparent difficulties Factors Affecting Communication: Hearing impaired   Cognition Arousal: Alert Behavior During Therapy: WFL for tasks assessed/performed Cognition: No apparent impairments                               Following commands: Intact        Cueing   Cueing Techniques: Verbal cues                     Pertinent Vitals/ Pain       Pain Assessment Pain Assessment: No/denies pain  Frequency  Min 2X/week        Progress Toward Goals  OT Goals(current goals can now be found in the care plan section)  Progress towards OT goals: Progressing toward goals  Acute Rehab OT Goals OT Goal Formulation: With patient Time For Goal Achievement: 11/20/23 Potential to Achieve Goals: Good  Plan      Co-evaluation                 AM-PAC OT 6 Clicks Daily Activity     Outcome Measure   Help from another person eating meals?: None Help from another person  taking care of personal grooming?: A Little Help from another person toileting, which includes using toliet, bedpan, or urinal?: A Little Help from another person bathing (including washing, rinsing, drying)?: A Little Help from another person to put on and taking off regular upper body clothing?: None Help from another person to put on and taking off regular lower body clothing?: A Little 6 Click Score: 20    End of Session    OT Visit Diagnosis: Unsteadiness on feet (R26.81);Muscle weakness (generalized) (M62.81)   Activity Tolerance Other (comment) (dizzy)   Patient Left in chair;with call bell/phone within reach;with family/visitor present   Nurse Communication Mobility status        Time: 0945-1010 OT Time Calculation (min): 25 min  Charges: OT General Charges $OT Visit: 1 Visit OT Treatments $Self Care/Home Management : 23-37 mins  11/10/2023  RP, OTR/L  Acute Rehabilitation Services  Office:  [phone]   Charlie JONETTA Halsted 11/10/2023, 10:25 AM

## 2023-11-10 NOTE — Progress Notes (Signed)
 [facility] night cross cover note:   I was notified by RN of the patient's request for a sleep aid. I subsequently placed order for prn melatonin for insomnia.     Newton Pigg, DO Hospitalist

## 2023-11-11 ENCOUNTER — Other Ambulatory Visit: Payer: Self-pay | Admitting: Internal Medicine

## 2023-11-11 ENCOUNTER — Inpatient Hospital Stay (HOSPITAL_COMMUNITY)

## 2023-11-11 DIAGNOSIS — A040 Enteropathogenic Escherichia coli infection: Secondary | ICD-10-CM | POA: Diagnosis not present

## 2023-11-11 DIAGNOSIS — N189 Chronic kidney disease, unspecified: Secondary | ICD-10-CM | POA: Diagnosis not present

## 2023-11-11 DIAGNOSIS — K5010 Crohn's disease of large intestine without complications: Secondary | ICD-10-CM | POA: Diagnosis not present

## 2023-11-11 DIAGNOSIS — I10 Essential (primary) hypertension: Secondary | ICD-10-CM

## 2023-11-11 DIAGNOSIS — N179 Acute kidney failure, unspecified: Secondary | ICD-10-CM | POA: Diagnosis not present

## 2023-11-11 LAB — BASIC METABOLIC PANEL WITH GFR
Anion gap: 13 (ref 5–15)
BUN: 17 mg/dL (ref 8–23)
CO2: 42 mmol/L — ABNORMAL HIGH (ref 22–32)
Calcium: 7.5 mg/dL — ABNORMAL LOW (ref 8.9–10.3)
Chloride: 80 mmol/L — ABNORMAL LOW (ref 98–111)
Creatinine, Ser: 1.82 mg/dL — ABNORMAL HIGH (ref 0.44–1.00)
GFR, Estimated: 28 mL/min — ABNORMAL LOW (ref >60)
Glucose, Bld: 107 mg/dL — ABNORMAL HIGH (ref 70–99)
Potassium: 3.2 mmol/L — ABNORMAL LOW (ref 3.5–5.1)
Sodium: 135 mmol/L (ref 135–145)

## 2023-11-11 LAB — PHOSPHORUS: Phosphorus: 3.8 mg/dL (ref 2.5–4.6)

## 2023-11-11 LAB — CBC WITH DIFFERENTIAL/PLATELET
Abs Immature Granulocytes: 0.07 K/uL (ref 0.00–0.07)
Basophils Absolute: 0.1 K/uL (ref 0.0–0.1)
Basophils Relative: 1 %
Eosinophils Absolute: 0.7 K/uL — ABNORMAL HIGH (ref 0.0–0.5)
Eosinophils Relative: 5 %
HCT: 30.2 % — ABNORMAL LOW (ref 36.0–46.0)
Hemoglobin: 9.9 g/dL — ABNORMAL LOW (ref 12.0–15.0)
Immature Granulocytes: 1 %
Lymphocytes Relative: 19 %
Lymphs Abs: 2.5 K/uL (ref 0.7–4.0)
MCH: 29.2 pg (ref 26.0–34.0)
MCHC: 32.8 g/dL (ref 30.0–36.0)
MCV: 89.1 fL (ref 80.0–100.0)
Monocytes Absolute: 1.1 K/uL — ABNORMAL HIGH (ref 0.1–1.0)
Monocytes Relative: 8 %
Neutro Abs: 8.9 K/uL — ABNORMAL HIGH (ref 1.7–7.7)
Neutrophils Relative %: 66 %
Platelets: 334 K/uL (ref 150–400)
RBC: 3.39 MIL/uL — ABNORMAL LOW (ref 3.87–5.11)
RDW: 15 % (ref 11.5–15.5)
WBC: 13.3 K/uL — ABNORMAL HIGH (ref 4.0–10.5)
nRBC: 0 % (ref 0.0–0.2)

## 2023-11-11 LAB — GLUCOSE, CAPILLARY: Glucose-Capillary: 103 mg/dL — ABNORMAL HIGH (ref 70–99)

## 2023-11-11 LAB — BRAIN NATRIURETIC PEPTIDE: B Natriuretic Peptide: 153 pg/mL — ABNORMAL HIGH (ref 0.0–100.0)

## 2023-11-11 LAB — MAGNESIUM: Magnesium: 1.1 mg/dL — ABNORMAL LOW (ref 1.7–2.4)

## 2023-11-11 LAB — CALPROTECTIN, FECAL: Calprotectin, Fecal: 1190 ug/g — ABNORMAL HIGH (ref 0–120)

## 2023-11-11 MED ORDER — IPRATROPIUM-ALBUTEROL 0.5-2.5 (3) MG/3ML IN SOLN
3.0000 mL | Freq: Once | RESPIRATORY_TRACT | Status: AC
  Filled 2023-11-11: qty 3

## 2023-11-11 MED ORDER — MAGNESIUM SULFATE 2 GM/50ML IV SOLN
2.0000 g | Freq: Once | INTRAVENOUS | Status: AC
  Administered 2023-11-11: 2 g via INTRAVENOUS
  Filled 2023-11-11: qty 50

## 2023-11-11 MED ORDER — IPRATROPIUM-ALBUTEROL 0.5-2.5 (3) MG/3ML IN SOLN
RESPIRATORY_TRACT | Status: AC
  Administered 2023-11-11: 3 mL via RESPIRATORY_TRACT
  Filled 2023-11-11: qty 3

## 2023-11-11 MED ORDER — ISOSORBIDE MONONITRATE ER 30 MG PO TB24
30.0000 mg | ORAL_TABLET | Freq: Every day | ORAL | Status: DC
  Administered 2023-11-11 – 2023-11-12 (×2): 30 mg via ORAL
  Filled 2023-11-11 (×2): qty 1

## 2023-11-11 MED ORDER — MAGNESIUM SULFATE 4 GM/100ML IV SOLN
4.0000 g | Freq: Once | INTRAVENOUS | Status: AC
  Administered 2023-11-11: 4 g via INTRAVENOUS
  Filled 2023-11-11: qty 100

## 2023-11-11 MED ORDER — ACETAZOLAMIDE 250 MG PO TABS
500.0000 mg | ORAL_TABLET | Freq: Once | ORAL | Status: AC
  Administered 2023-11-11: 500 mg via ORAL
  Filled 2023-11-11 (×2): qty 2

## 2023-11-11 MED ORDER — POTASSIUM CHLORIDE 20 MEQ PO PACK
40.0000 meq | PACK | Freq: Two times a day (BID) | ORAL | Status: AC
  Administered 2023-11-11 (×2): 40 meq via ORAL
  Filled 2023-11-11 (×2): qty 2

## 2023-11-11 MED ORDER — DIPHENOXYLATE-ATROPINE 2.5-0.025 MG PO TABS
1.0000 | ORAL_TABLET | Freq: Four times a day (QID) | ORAL | Status: DC | PRN
Start: 2023-11-11 — End: 2023-11-12
  Administered 2023-11-12: 1 via ORAL
  Filled 2023-11-11: qty 1

## 2023-11-11 MED ORDER — FUROSEMIDE 10 MG/ML IJ SOLN
40.0000 mg | Freq: Once | INTRAMUSCULAR | Status: AC
  Administered 2023-11-11: 40 mg via INTRAVENOUS
  Filled 2023-11-11: qty 4

## 2023-11-11 NOTE — Progress Notes (Signed)
 PROGRESS NOTE        PATIENT DETAILS Name: [name] Age: 78 y.o. Sex: female Date of Birth: 1945-09-03 Admit Date: 11/04/2023 Admitting Physician Micaela Speaker, MD ERE:Yzmwjwizs [name] GRADE, MD  Brief Summary: Patient is a 78 y.o.  female with history of chron's disease, HTN-who presented with diarrhea/weakness-found to have AKI and subsequently admitted to the hospitalist service.  Significant events: 10/29>> admit to [facility]  Significant studies: 10/28>> CT abdomen/pelvis: Active colitis-transverse/descending colon.  Significant microbiology data: 10/29>> blood culture: No growth  Procedures: None  Consults: GI Nephrology  Subjective:  Patient in bed, appears comfortable, denies any headache, no fever, no chest pain or pressure, +ve orthopnea and shortness of breath , no abdominal pain. No new focal weakness.   Objective: Vitals: Blood pressure 135/82, pulse (!) 107, temperature 98.2 F (36.8 C), temperature source Oral, resp. rate 18, weight 57.6 kg, SpO2 98%.   Exam:  Awake Alert, No new F.N deficits, Normal affect [place].AT,PERRAL Supple Neck, No JVD,   Symmetrical Chest wall movement, Good air movement bilaterally, +ve rales RRR,No Gallops, Rubs or new Murmurs,  +ve B.Sounds, Abd Soft, No tenderness,   No Cyanosis, Clubbing or edema    Assessment/Plan:  Orthopnea and shortness of breath morning of 11/11/2023.  Likely from fluid overload, chest x-ray reveals mild fluid overload, due to excessive IV fluids, since diarrhea has improved, stop IV fluids, Lasix , also bicarb quite high 1 dose of Diamox.  Discussed with nephrology Dr. Melia.  Also added nebulizer treatment, I-S and flutter valve along with nasal cannula oxygen.  AKI on CKD stage IIIa AKI likely hemodynamically mediated in the setting of diarrhea/losartan  use.  UA without proteinuria-CT without any hydronephrosis. Creatinine improving with supportive care  No acute  indications for HD Continue IV fluid hydration, renal function slightly improved on 11/09/2023. Avoid nephrotoxic agents Follow electrolytes.  Metabolic acidosis Secondary to diarrhea/AKI Bicarb levels have improved after improvement in diarrhea and improvement in renal function-Will cautiously continue with IV bicarb fluid today-suspect this can be discontinued soon.  Hypokalemia, hypophosphatemia and hypomagnesemia Replace  Acute on chronic diarrhea-likely secondary to enteropathogenic E. coli gastroenteritis in a patient with Crohn's Diarrhea seems to have slowed down-Will discuss with nursing staff to send stool studies if she has loose stools today. On empiric Rocephin/Lomotil  and as needed Bentyl .  Flagyl  stopped by GI on 11/08/2023. GI following, diarrhea has improved along with improvement in nausea on 11/10/2023. Skyrizi  on hold  Normocytic anemia No evidence of blood loss-this is likely secondary to acute illness Follow CBC  HTN Losartan  remains on hold due to AKI BP is high increase Norvasc dose add as needed hydralazine   GERD PPI  GAD  Cymbalta  on hold-Due to AKI  Hard of hearing Has hearing aids at bedside  Code status:   Code Status: Full Code   DVT Prophylaxis: heparin  injection 5,000 Units Start: 11/05/23 0600 SCDs Start: 11/05/23 0114 Place TED hose Start: 11/05/23 0114   Family Communication: Spouse at bedside 10/30-none at bedside this morning.   Disposition Plan: Status is: Inpatient Remains inpatient appropriate because: Severity of illness   Planned Discharge Destination:Home   Diet: Diet Order             Diet renal with fluid restriction Fluid restriction: Other (see comments); Room service appropriate? Yes; Fluid consistency: Thin  Diet effective now  MEDICATIONS: Scheduled Meds:  acetaZOLAMIDE  500 mg Oral Once   amLODipine  10 mg Oral Daily   diphenoxylate -atropine   2 tablet Oral TID   ferrous sulfate    325 mg Oral QHS   furosemide   40 mg Intravenous Once   heparin   5,000 Units Subcutaneous Q8H   ipratropium-albuterol   3 mL Nebulization Once   isosorbide mononitrate  15 mg Oral Daily   pantoprazole   40 mg Oral Daily   potassium chloride  40 mEq Oral BID   Continuous Infusions:  magnesium  sulfate bolus IVPB     magnesium  sulfate bolus IVPB 4 g (11/11/23 0603)   PRN Meds:.acetaminophen  **OR** acetaminophen , albuterol , bismuth subsalicylate, dicyclomine , hydrALAZINE , melatonin, ondansetron  **OR** ondansetron  (ZOFRAN ) IV, prochlorperazine    I have personally reviewed following labs and imaging studies  LABORATORY DATA: CBC: Recent Labs  Lab 11/06/23 0211 11/07/23 0307 11/08/23 0725 11/09/23 0358 11/10/23 0346 11/11/23 0355  WBC 8.6 9.0 10.1 13.3* 14.1* 13.3*  NEUTROABS 5.3  --  7.2 11.3* 10.5* 8.9*  HGB 9.4* 9.7* 9.6* 10.1* 9.6* 9.9*  HCT 27.4* 28.2* 28.1* 29.1* 29.6* 30.2*  MCV 85.9 83.9 84.9 85.3 88.6 89.1  PLT 329 336 316 340 281 334    Basic Metabolic Panel: Recent Labs  Lab 11/06/23 0211 11/07/23 0307 11/08/23 0725 11/09/23 0358 11/10/23 0346 11/11/23 0355  NA 134* 136 141 138 135 135  K 4.3 3.8 3.3* 4.0 3.5 3.2*  CL 103 97* 89* 86* 80* 80*  CO2 16* 22 28 32 40* 42*  GLUCOSE 100* 91 70 101* 106* 107*  BUN 116* 92* 62* 41* 23 17  CREATININE 6.96* 4.47* 2.94* 2.45* 1.93* 1.82*  CALCIUM  8.6* 8.2* 7.7* 7.7* 7.5* 7.5*  MG 1.7  --  1.2* 2.0 1.4* 1.1*  PHOS  --  2.9 3.4 2.6 1.9* 3.8    GFR: Estimated Creatinine Clearance: 20.2 mL/min (A) (by C-G formula based on SCr of 1.82 mg/dL (H)).  Liver Function Tests: Recent Labs  Lab 11/04/23 2209 11/05/23 0501 11/06/23 0211 11/07/23 0307  AST 24 13* 13*  --   ALT 11 10 11   --   ALKPHOS 97 93 62  --   BILITOT 0.3 0.3 0.8  --   PROT 7.6 7.3 6.0*  --   ALBUMIN 3.6 3.6 2.6* 2.6*   Recent Labs  Lab 11/04/23 2209  LIPASE 58*   No results for input(s): AMMONIA in the last 168 hours.  Coagulation  Profile: No results for input(s): INR, PROTIME in the last 168 hours.  Cardiac Enzymes: No results for input(s): CKTOTAL, CKMB, CKMBINDEX, TROPONINI in the last 168 hours.  BNP (last 3 results) No results for input(s): PROBNP in the last 8760 hours.  Lipid Profile: No results for input(s): CHOL, HDL, LDLCALC, TRIG, CHOLHDL, LDLDIRECT in the last 72 hours.  Thyroid  Function Tests: No results for input(s): TSH, T4TOTAL, FREET4, T3FREE, THYROIDAB in the last 72 hours.  Anemia Panel: No results for input(s): VITAMINB12, FOLATE, FERRITIN, TIBC, IRON, RETICCTPCT in the last 72 hours.  Urine analysis:    Component Value Date/Time   COLORURINE YELLOW 11/05/2023 1643   APPEARANCEUR HAZY (A) 11/05/2023 1643   LABSPEC 1.015 11/05/2023 1643   PHURINE 5.0 11/05/2023 1643   GLUCOSEU NEGATIVE 11/05/2023 1643   GLUCOSEU NEGATIVE 02/16/2019 1534   HGBUR NEGATIVE 11/05/2023 1643   BILIRUBINUR NEGATIVE 11/05/2023 1643   BILIRUBINUR neg 10/29/2023 1652   KETONESUR NEGATIVE 11/05/2023 1643   PROTEINUR NEGATIVE 11/05/2023 1643   UROBILINOGEN 0.2 10/29/2023 1652  UROBILINOGEN 0.2 02/16/2019 1534   NITRITE NEGATIVE 11/05/2023 1643   LEUKOCYTESUR MODERATE (A) 11/05/2023 1643    Sepsis Labs: Lactic Acid, Venous    Component Value Date/Time   LATICACIDVEN 1.4 05/02/2021 1522    MICROBIOLOGY: Recent Results (from the past 240 hours)  Blood culture (routine x 2)     Status: None   Collection Time: 11/05/23  1:06 AM   Specimen: BLOOD LEFT FOREARM  Result Value Ref Range Status   Specimen Description   Final    BLOOD LEFT FOREARM Performed at [facility], [address] [place], [place] [address]    Special Requests   Final    Blood Culture results may not be optimal due to an inadequate volume of blood received in culture bottles BOTTLES DRAWN AEROBIC AND ANAEROBIC Performed at [facility], [address]  [place], [place] [address]    Culture   Final    NO GROWTH 5 DAYS Performed at [facility], [address] [place], [place] [address]    Report Status 11/10/2023 FINAL  Final  Gastrointestinal Panel by PCR , Stool     Status: Abnormal   Collection Time: 11/05/23  5:56 PM   Specimen: STOOL  Result Value Ref Range Status   Campylobacter species NOT DETECTED NOT DETECTED Final   Plesimonas shigelloides NOT DETECTED NOT DETECTED Final   Salmonella species NOT DETECTED NOT DETECTED Final   Yersinia enterocolitica NOT DETECTED NOT DETECTED Final   Vibrio species NOT DETECTED NOT DETECTED Final   Vibrio cholerae NOT DETECTED NOT DETECTED Final   Enteroaggregative E coli (EAEC) NOT DETECTED NOT DETECTED Final   Enteropathogenic E coli (EPEC) DETECTED (A) NOT DETECTED Final    Comment: RESULT CALLED TO, READ BACK BY AND VERIFIED WITH:  KATHERINE B., RN AT 1651 11/08/23 RAM    Enterotoxigenic E coli (ETEC) NOT DETECTED NOT DETECTED Final   Shiga like toxin producing E coli (STEC) NOT DETECTED NOT DETECTED Final   Shigella/Enteroinvasive E coli (EIEC) NOT DETECTED NOT DETECTED Final   Cryptosporidium NOT DETECTED NOT DETECTED Final   Cyclospora cayetanensis NOT DETECTED NOT DETECTED Final   Entamoeba histolytica NOT DETECTED NOT DETECTED Final   Giardia lamblia NOT DETECTED NOT DETECTED Final   Adenovirus F40/41 NOT DETECTED NOT DETECTED Final   Astrovirus NOT DETECTED NOT DETECTED Final   Norovirus GI/GII NOT DETECTED NOT DETECTED Final   Rotavirus A NOT DETECTED NOT DETECTED Final   Sapovirus (I, II, IV, and V) NOT DETECTED NOT DETECTED Final    Comment: Performed at [facility], [address] [place], [place] [address]  Blood culture (routine x 2)     Status: None   Collection Time: 11/05/23  9:04 PM   Specimen: BLOOD  Result Value Ref Range Status   Specimen Description BLOOD SITE NOT SPECIFIED  Final   Special Requests   Final    BOTTLES DRAWN AEROBIC AND ANAEROBIC  Blood Culture adequate volume   Culture   Final    NO GROWTH 5 DAYS Performed at [facility], [address], [place], [place] [address]    Report Status 11/10/2023 FINAL  Final  C Difficile Quick Screen w PCR reflex     Status: None   Collection Time: 11/06/23  6:31 AM   Specimen: STOOL  Result Value Ref Range Status   C Diff antigen NEGATIVE NEGATIVE Final   C Diff toxin NEGATIVE NEGATIVE Final   C  Diff interpretation No C. difficile detected.  Final    Comment: Performed at [facility], [address], [place], [place] [address]    RADIOLOGY STUDIES/RESULTS:  DG Chest Port 1 View Result Date: 11/11/2023 EXAM: 1 VIEW(S) XRAY OF THE CHEST 11/11/2023 06:33:00 AM COMPARISON: 10/30/2023 CLINICAL HISTORY: Shortness of breath FINDINGS: LUNGS AND PLEURA: Low lung volumes. Streaky bibasilar opacities. Mild interstitial edema. Small pleural effusion suspected. No pneumothorax. HEART AND MEDIASTINUM: Aortic atherosclerosis tortuosity. No acute abnormality of the cardiac silhouette. BONES AND SOFT TISSUES: Dextroscoliotic curvature of thoracic spine. No acute osseous abnormality. IMPRESSION: 1. Mild interstitial edema and a small pleural effusion are suspected. Correlate per any clinical signs or symptoms of congestive heart failure. 2. Low lung volumes with bibasilar streaky opacities favoring atelectasis. Electronically signed by: Waddell Calk MD 11/11/2023 07:37 AM EST RP Workstation: [id number]      LOS: 6 days   Signature  -    Lavada Stank M.D on 11/11/2023 at 7:42 AM   -  To page go to [email or web address]

## 2023-11-11 NOTE — Progress Notes (Signed)
 Patient ID: Florrie LILLETTE Riis, female   DOB: Feb 27, 1945, 78 y.o.   MRN: [id number]    Progress Note   Subjective   Day # 6 CC; stricturing Crohn's disease, acute diarrheal illness  Stool culture + EPEC-completing 5 days of ceftriaxone  Fecal calprotectin from admission markedly elevated at 1190-however this was in the setting of an acute infectious colitis  She is currently on scheduled Lomotil , thinks she only had 1-2 bowel movements yesterday and has not had a bowel movement at all today thus far She did develop sense of air hunger/shortness of breath early this morning and some dizziness. Sats were 100% on 2 L,/tachycardic respiratory rate 20 Chest x-ray shows mild interstitial edema and a small pleural effusion suggestive of CHF.  Labs today-WBC 13.3/hemoglobin 9.9/hematocrit 30.2 Potassium 3.2/BUN 17/creatinine 1.82 Magnesium  1.1 phosphorus 3.8 BNP 153  She is being diuresed and says she does feel better this afternoon her husband has noticed that she has still been having a bit of twitching.  Sitting up on the side of the bed, no complaint currently of shortness of breath remains on O2 at 2 L   Objective   Vital signs in last 24 hours: Temp:  [97.5 F (36.4 C)-98.2 F (36.8 C)] 97.5 F (36.4 C) (11/04 1208) Pulse Rate:  [72-108] 98 (11/04 1208) BP: (92-159)/(74-133) 119/84 (11/04 1255) SpO2:  [93 %-100 %] 95 % (11/04 1208) Weight:  [57.6 kg] 57.6 kg (11/04 0500) Last BM Date : 11/10/23 General:    Early white female in NAD on O2 at 2 L but has been at bedside Heart:  Regular rate and rhythm; no murmurs Lungs: Respirations even and unlabored, l Abdomen:  Soft, nontender and nondistended. Normal bowel sounds. Extremities:  Without edema. Neurologic:  Alert and oriented,  grossly normal neurologically. Psych:  Cooperative. Normal mood and affect.  Intake/Output from previous day: 11/03 0701 - 11/04 0700 In: 4956.6 [I.V.:4956.6] Out: 700 [Urine:700] Intake/Output this  shift: No intake/output data recorded.  Lab Results: Recent Labs    11/09/23 0358 11/10/23 0346 11/11/23 0355  WBC 13.3* 14.1* 13.3*  HGB 10.1* 9.6* 9.9*  HCT 29.1* 29.6* 30.2*  PLT 340 281 334   BMET Recent Labs    11/09/23 0358 11/10/23 0346 11/11/23 0355  NA 138 135 135  K 4.0 3.5 3.2*  CL 86* 80* 80*  CO2 32 40* 42*  GLUCOSE 101* 106* 107*  BUN 41* 23 17  CREATININE 2.45* 1.93* 1.82*  CALCIUM  7.7* 7.5* 7.5*   LFT No results for input(s): PROT, ALBUMIN, AST, ALT, ALKPHOS, BILITOT, BILIDIR, IBILI in the last 72 hours. PT/INR No results for input(s): LABPROT, INR in the last 72 hours.  Studies/Results: DG Chest Port 1 View Result Date: 11/11/2023 EXAM: 1 VIEW(S) XRAY OF THE CHEST 11/11/2023 06:33:00 AM COMPARISON: 10/30/2023 CLINICAL HISTORY: Shortness of breath FINDINGS: LUNGS AND PLEURA: Low lung volumes. Streaky bibasilar opacities. Mild interstitial edema. Small pleural effusion suspected. No pneumothorax. HEART AND MEDIASTINUM: Aortic atherosclerosis tortuosity. No acute abnormality of the cardiac silhouette. BONES AND SOFT TISSUES: Dextroscoliotic curvature of thoracic spine. No acute osseous abnormality. IMPRESSION: 1. Mild interstitial edema and a small pleural effusion are suspected. Correlate per any clinical signs or symptoms of congestive heart failure. 2. Low lung volumes with bibasilar streaky opacities favoring atelectasis. Electronically signed by: Waddell Calk MD 11/11/2023 07:37 AM EST RP Workstation: [id number]       Assessment / Plan:    #59 78 year old white female with history of chronic kidney disease,  hypertension, neuropathy, rheumatoid arthritis, and complicated Crohn's colitis/stricturing who had just been initiated on Skyrizi  as an outpatient.  She completed 2 doses and relates that she had onset of severe diarrhea after each dose of Skyrizi . After the second dose with persistence of diarrhea and associated weakness she came  to the emergency room and was admitted on 11/05/2023.  Stool cultures positive for EPEC and she has completed 5 days of ceftriaxone Xifaxan   Diarrhea has resolved at this time she has not had a bowel movement at all today, no significant complaint of abdominal pain.  Able to eat solid food  She also had a positive SIBO  outpatient August 2025, was given a 14-day course of Xifaxan  without any change in symptoms.  #2 acute kidney injury secondary to acute diarrheal illness-use to improve #3 acute dyspnea onset this morning-chest x-ray showing mild interstitial edema consistent with fluid overload IV fluids have been stopped, she is being diuresed today and is stable on O2 at 2 L. Feeling better this afternoon  #4 hypokalemia/hypomagnesemia replacing #5 normocytic anemia-stable #6 generalized anxiety disorder  Plan; diet as tolerated Do not plan to reinitiate budesonide  at this time patient has been instructed to stay off of budesonide  until she has follow-up with Dr. Suzann Would change Lomotil  to as needed and she can continue Lomotil  on discharge on a as needed basis Patient is adamant that she does not want to continue on Skyrizi , she will need to have further conversation with Dr. Suzann regarding initiating another biologic agent. Our office will arrange for follow-up with Dr. Suzann within the next couple of weeks. GI will continue to follow along hopefully she will be able to be discharged within the next 24 to 36 hours.     Principal Problem:   Acute renal failure Active Problems:   Paraesophageal hernia   IBS (irritable bowel syndrome)   Acute colitis   Generalized anxiety disorder   Crohn's disease of colon without complication (HCC)   Schatzki's ring   Hx of gastroesophageal reflux (GERD)   Hyperkalemia   History of rheumatoid arthritis   Hyponatremia   Essential hypertension   Diarrhea of presumed infectious origin   Enteritis, enteropathogenic E. coli      LOS: 6 days   Sherlynn Tourville EsterwoodPA-C  11/11/2023, 3:39 PM

## 2023-11-11 NOTE — Plan of Care (Signed)
   Problem: Education: Goal: Knowledge of General Education information will improve Description: Including pain rating scale, medication(s)/side effects and non-pharmacologic comfort measures Outcome: Progressing   Problem: Clinical Measurements: Goal: Respiratory complications will improve Outcome: Progressing   Problem: Activity: Goal: Risk for activity intolerance will decrease Outcome: Progressing   Problem: Nutrition: Goal: Adequate nutrition will be maintained Outcome: Progressing   Problem: Elimination: Goal: Will not experience complications related to bowel motility Outcome: Progressing

## 2023-11-11 NOTE — Significant Event (Signed)
 Rapid Response Event Note   Reason for Call :  Pt says she does feel right.  Initial Focused Assessment:  Pt lying in bed with eyes open. She is anxious. She has a tremor in her hands but she says this isn't new. She is c/o dizziness and feeling like she can't take a deep breath. She denies CP. Lungs clear/diminished. Skin warm and dry. NIH-0  T-98.2, HR-106, BP-127/96, RR-20, SpO2-100% on 2L [place]  Interventions:  CBG-103 PCXR Plan of Care:  Await PCXR results. Continue to monitor pt closely. Please call RRT if further assistance needed.   Event Summary:   MD Notified: Dr. Marcene  Call Upfz:[phone] Arrival [phone] End Time:0600  Tish Graeme Piety, RN

## 2023-11-11 NOTE — Progress Notes (Signed)
 [facility] night cross cover note:   I was notified that the patient is report that she is having difficulty taking a deep breath and that she just doesn't feel right and appear anxious. VS notable for afebrile, sinus tachycardia with HR's in the low 100's, which appears to be elevated relative to earlier this evening; SBP's in the 120's - 150's; O2 sats 95 to 100% on RA.   Per brief chart review, bmp this AM shows elevated bicarb of 42. Review of I&O's reflects documentation of net positive fluid balance over course of hospitalization of about 11.6 liters.   I subsequently d\c'ed her sodium bicarbonate drip 150 meq at 125 cc/hr, ordered stat cxr, and bnp.      Eva Pore, DO Hospitalist

## 2023-11-11 NOTE — TOC Progression Note (Signed)
 Transition of Care [facility]) - Progression Note    Patient Details  Name: [name] MRN: [id number] Date of Birth: 1945-05-12  Transition of Care [facility]) CM/SW Contact  Marval Gell, RN Phone Number: 11/11/2023, 11:50 AM  Clinical Narrative:     Beatris w patient and family at bedside to discuss DC plan. Patoent is agreeable to [facility] services, discussed providers and she would like me to choose from medicare ratings list. [facility] ranked as first for patient preference and was able to accept.  Patient states she has RW grab bars and walk in shower at home, declines need for additional DME at this time   Expected Discharge Plan: Home w Home Health Services Barriers to Discharge: Continued Medical Work up               Expected Discharge Plan and Services   Discharge Planning Services: CM Consult   Living arrangements for the past 2 months: Single Family Home                 DME Arranged: N/A         HH Arranged: PT, OT HH Agency: [facility] Date HH Agency Contacted: 11/11/23 Time HH Agency Contacted: 1149 Representative spoke with at [facility] Agency: [place]   Social Drivers of Health (SDOH) Interventions SDOH Screenings   Food Insecurity: No Food Insecurity (11/05/2023)  Housing: Low Risk  (11/07/2023)  Transportation Needs: No Transportation Needs (11/05/2023)  Utilities: Not At Risk (11/05/2023)  Depression (PHQ2-9): Low Risk  (04/22/2023)  Financial Resource Strain: Low Risk  (04/16/2022)  Physical Activity: Insufficiently Active (04/16/2022)  Social Connections: Unknown (11/05/2023)  Stress: No Stress Concern Present (04/16/2022)  Tobacco Use: Medium Risk (11/04/2023)  Health Literacy: Unknown (08/04/2022)   Received [facility]    Readmission Risk Interventions    11/04/2022    3:48 PM  Readmission Risk Prevention Plan  Transportation Screening Complete  PCP or Specialist Appt within 5-7 Days Complete  Home Care Screening  Complete  Medication Review (RN CM) Referral to Pharmacy

## 2023-11-12 ENCOUNTER — Telehealth: Payer: Self-pay

## 2023-11-12 ENCOUNTER — Other Ambulatory Visit (HOSPITAL_COMMUNITY): Payer: Self-pay

## 2023-11-12 DIAGNOSIS — N179 Acute kidney failure, unspecified: Secondary | ICD-10-CM | POA: Diagnosis not present

## 2023-11-12 LAB — CBC WITH DIFFERENTIAL/PLATELET
Abs Immature Granulocytes: 0.08 K/uL — ABNORMAL HIGH (ref 0.00–0.07)
Basophils Absolute: 0.2 K/uL — ABNORMAL HIGH (ref 0.0–0.1)
Basophils Relative: 1 %
Eosinophils Absolute: 0.4 K/uL (ref 0.0–0.5)
Eosinophils Relative: 2 %
HCT: 32.7 % — ABNORMAL LOW (ref 36.0–46.0)
Hemoglobin: 10.6 g/dL — ABNORMAL LOW (ref 12.0–15.0)
Immature Granulocytes: 1 %
Lymphocytes Relative: 13 %
Lymphs Abs: 2 K/uL (ref 0.7–4.0)
MCH: 29.5 pg (ref 26.0–34.0)
MCHC: 32.4 g/dL (ref 30.0–36.0)
MCV: 91.1 fL (ref 80.0–100.0)
Monocytes Absolute: 1.2 K/uL — ABNORMAL HIGH (ref 0.1–1.0)
Monocytes Relative: 8 %
Neutro Abs: 11.9 K/uL — ABNORMAL HIGH (ref 1.7–7.7)
Neutrophils Relative %: 75 %
Platelets: 392 K/uL (ref 150–400)
RBC: 3.59 MIL/uL — ABNORMAL LOW (ref 3.87–5.11)
RDW: 15.4 % (ref 11.5–15.5)
WBC: 15.8 K/uL — ABNORMAL HIGH (ref 4.0–10.5)
nRBC: 0 % (ref 0.0–0.2)

## 2023-11-12 LAB — BASIC METABOLIC PANEL WITH GFR
Anion gap: 19 — ABNORMAL HIGH (ref 5–15)
BUN: 15 mg/dL (ref 8–23)
CO2: 33 mmol/L — ABNORMAL HIGH (ref 22–32)
Calcium: 8.8 mg/dL — ABNORMAL LOW (ref 8.9–10.3)
Chloride: 81 mmol/L — ABNORMAL LOW (ref 98–111)
Creatinine, Ser: 2.07 mg/dL — ABNORMAL HIGH (ref 0.44–1.00)
GFR, Estimated: 24 mL/min — ABNORMAL LOW (ref >60)
Glucose, Bld: 107 mg/dL — ABNORMAL HIGH (ref 70–99)
Potassium: 3.1 mmol/L — ABNORMAL LOW (ref 3.5–5.1)
Sodium: 133 mmol/L — ABNORMAL LOW (ref 135–145)

## 2023-11-12 LAB — PHOSPHORUS: Phosphorus: 4.5 mg/dL (ref 2.5–4.6)

## 2023-11-12 LAB — MAGNESIUM: Magnesium: 2.9 mg/dL — ABNORMAL HIGH (ref 1.7–2.4)

## 2023-11-12 MED ORDER — AMLODIPINE BESYLATE 10 MG PO TABS
10.0000 mg | ORAL_TABLET | Freq: Every day | ORAL | 0 refills | Status: DC
  Filled 2023-11-12: qty 30, 30d supply, fill #0

## 2023-11-12 MED ORDER — POTASSIUM CHLORIDE 20 MEQ PO PACK
40.0000 meq | PACK | Freq: Once | ORAL | Status: AC
  Administered 2023-11-12: 40 meq via ORAL
  Filled 2023-11-12: qty 2

## 2023-11-12 MED ORDER — POTASSIUM CHLORIDE 20 MEQ PO PACK
40.0000 meq | PACK | Freq: Three times a day (TID) | ORAL | Status: DC
  Administered 2023-11-12: 40 meq via ORAL
  Filled 2023-11-12: qty 2

## 2023-11-12 MED ORDER — ISOSORBIDE MONONITRATE ER 30 MG PO TB24
30.0000 mg | ORAL_TABLET | Freq: Every day | ORAL | 0 refills | Status: DC
  Filled 2023-11-12: qty 30, 30d supply, fill #0

## 2023-11-12 NOTE — Discharge Summary (Signed)
 [name] FMW:[id number] DOB: [birth year] DOA: 11/04/2023  PCP: Theophilus Andrews, Tully GRADE, MD  Admit date: 11/04/2023  Discharge date: 11/12/2023  Admitted From: Home   Disposition:  Home   Recommendations for Outpatient Follow-up:   Follow up with PCP in 1-2 weeks  PCP Please obtain BMP/CBC, 2 view CXR in 1week,  (see Discharge instructions)   PCP Please follow up on the following pending results: Monitor CBC, BMP in 3 to 4 days.   Home Health: PT   Equipment/Devices: walker  Consultations: GI, Renal Discharge Condition: Stable    CODE STATUS: Full    Diet Recommendation: Heart Healthy     Chief Complaint  Patient presents with   Diarrhea     Brief history of present illness from the day of admission and additional interim summary    78 y.o.  female with history of chron's disease, HTN-who presented with diarrhea/weakness-found to have AKI and subsequently admitted to the hospitalist service.   Significant events: 10/29>> admit to [facility]   Significant studies: 10/28>> CT abdomen/pelvis: Active colitis-transverse/descending colon                                                                 Hospital Course   AKI on CKD stage IIIa AKI likely hemodynamically mediated in the setting of diarrhea/losartan  use.  UA without proteinuria-CT without any hydronephrosis. Admission has serially improved and plateaued, good urine output, continue to hold ARB discharge check BMP in few days by PCP.   Metabolic acidosis Resolved   Hypokalemia, hypophosphatemia and hypomagnesemia Replaced, PCP to recheck   Acute on chronic diarrhea-likely secondary to enteropathogenic E. coli gastroenteritis in a patient with Crohn's And by GI, received 5 days of IV Rocephin treatment, diarrhea nausea vomiting have  resolved she is symptom-free will be discharged per GI continue to hold Entocort and discontinue Skyrizi , outpatient follow-up with GI postdischarge   Normocytic anemia Stable outpatient follow-up and monitoring by PCP.   HTN Losartan  remains on hold due to AKI Placed her on combination of Norvasc and Imdur, PCP to monitor blood pressure and renal function closely.   GERD PPI   GAD  Cymbalta  on hold-Due to AKI   Orthopnea and shortness of breath morning of 11/11/2023.  Likely from fluid overload, chest x-ray reveals mild fluid overload, IV fluids discontinued diuresed.  This problem has resolved.  Hard of hearing Has hearing aids at bedside    Discharge diagnosis     Principal Problem:   Acute renal failure Active Problems:   Acute colitis   Hyperkalemia   Hyponatremia   Paraesophageal hernia   IBS (irritable bowel syndrome)   Generalized anxiety disorder   Crohn's disease of colon without complication (HCC)   Schatzki's ring   Hx of gastroesophageal reflux (GERD)   History  of rheumatoid arthritis   Essential hypertension   Diarrhea of presumed infectious origin   Enteritis, enteropathogenic E. coli    Discharge instructions    Discharge Instructions     Diet - low sodium heart healthy   Complete by: As directed    Discharge instructions   Complete by: As directed    Follow with Primary MD Theophilus Andrews, Tully GRADE, MD in 3-4 days   Get CBC, CMP, Magnesium , 2 view Chest X ray -  checked next visit with your primary MD   Activity: As tolerated with Full fall precautions use walker/cane & assistance as needed  Disposition Home     Diet: Heart Healthy   Special Instructions: If you have smoked or chewed Tobacco  in the last 2 yrs please stop smoking, stop any regular Alcohol  and or any Recreational drug use.  On your next visit with your primary care physician please Get Medicines reviewed and adjusted.  Please request your Prim.MD to go over all  Hospital Tests and Procedure/Radiological results at the follow up, please get all Hospital records sent to your Prim MD by signing hospital release before you go home.  If you experience worsening of your admission symptoms, develop shortness of breath, life threatening emergency, suicidal or homicidal thoughts you must seek medical attention immediately by calling 911 or calling your MD immediately  if symptoms less severe.  You Must read complete instructions/literature along with all the possible adverse reactions/side effects for all the Medicines you take and that have been prescribed to you. Take any new Medicines after you have completely understood and accpet all the possible adverse reactions/side effects.   Do not drive when taking Pain medications.  Do not take more than prescribed Pain, Sleep and Anxiety Medications  Wear Seat belts while driving.   Increase activity slowly   Complete by: As directed        Discharge Medications   Allergies as of 11/12/2023       Reactions   Aspirin Other (See Comments)   Intestinal Bleeding   Latex Other (See Comments)   Redness and skin peeling   Other Itching   Nuts-itching in throat  Seeds-stomach issues with chrons  Pt has emphysema   Skyrizi  [risankizumab ]    Excessive Diarrhea   Sulfa Antibiotics Nausea And Vomiting, Nausea Only, Other (See Comments), Swelling   Adhesive [tape] Other (See Comments)   Redness and skin peeling   Ciprofloxacin Nausea And Vomiting   Codeine Nausea And Vomiting   Demerol [meperidine Hcl] Nausea And Vomiting   Fentanyl  Nausea And Vomiting   Makes me very sick   Lactose Intolerance (gi) Other (See Comments)   Pt has Crohn's    Prednisone  Other (See Comments)   Upset stomach   Septra [sulfamethoxazole-trimethoprim] Nausea Only        Medication List     STOP taking these medications    budesonide  3 MG 24 hr capsule Commonly known as: ENTOCORT EC    cephALEXin  500 MG capsule Commonly  known as: KEFLEX    doxycycline  100 MG capsule Commonly known as: VIBRAMYCIN    losartan  100 MG tablet Commonly known as: COZAAR    Skyrizi  360 MG/2.4ML Soct Generic drug: Risankizumab -rzaa       TAKE these medications    albuterol  108 (90 Base) MCG/ACT inhaler Commonly known as: VENTOLIN  HFA Inhale 2 puffs into the lungs every 6 (six) hours as needed for wheezing or shortness of breath.   amLODipine 10 MG tablet Commonly  known as: NORVASC Take 1 tablet (10 mg total) by mouth daily.   azelastine  0.1 % nasal spray Commonly known as: ASTELIN  Place 2 sprays into both nostrils 2 (two) times daily. Use in each nostril as directed   CALCIUM  + D PO Take 1 tablet by mouth 2 (two) times daily.   cetirizine  10 MG tablet Commonly known as: ZYRTEC  Take 1 tablet (10 mg total) by mouth daily.   dicyclomine  20 MG tablet Commonly known as: BENTYL  Take 1 tablet (20 mg total) by mouth 4 (four) times daily as needed for spasms.   diphenoxylate -atropine  2.5-0.025 MG tablet Commonly known as: LOMOTIL  Take 2 tablets by mouth 3 (three) times daily. TAKE 2 TABLETS BY MOUTH IN THE MORNING ,AT NOON, AND AT BEDTIME AS DIRECTED   DULoxetine  60 MG capsule Commonly known as: CYMBALTA  Take 1 capsule (60 mg total) by mouth 2 (two) times daily.   fluticasone  50 MCG/ACT nasal spray Commonly known as: FLONASE  SPRAY 2 SPRAYS INTO EACH NOSTRIL EVERY DAY   Iron (Ferrous Sulfate ) 325 (65 Fe) MG Tabs Take 325 mg by mouth at bedtime.   isosorbide mononitrate 30 MG 24 hr tablet Commonly known as: IMDUR Take 1 tablet (30 mg total) by mouth daily.   meclizine  25 MG tablet Commonly known as: ANTIVERT  Take 25 mg by mouth 3 (three) times daily as needed for dizziness.   multivitamin with minerals Tabs tablet Take 1 tablet by mouth daily.   ofloxacin  0.3 % OTIC solution Commonly known as: FLOXIN  PLACE 4 DROPS INTO BOTH EARS 2 (TWO) TIMES DAILY FOR 5 DAYS.   omeprazole  40 MG capsule Commonly known  as: PRILOSEC Take 1 capsule (40 mg total) by mouth daily.   ondansetron  4 MG disintegrating tablet Commonly known as: ZOFRAN -ODT 4mg  ODT q4 hours prn nausea/vomit   ondansetron  4 MG tablet Commonly known as: Zofran  Take 1 tablet (4 mg total) by mouth every 8 (eight) hours as needed for nausea or vomiting.               Durable Medical Equipment  (From admission, onward)           Start     Ordered   11/12/23 0843  For home use only DME Walker rolling  Once       Question Answer Comment  Walker: With 5 Inch Wheels   Patient needs a walker to treat with the following condition Weakness      11/12/23 0842             Follow-up Information     [facility] Follow up.   Why: they will call you in 1-2 days to set up your home appointment Contact information: [address] [place] [address] [phone]         Theophilus Andrews, Tully GRADE, MD. Schedule an appointment as soon as possible for a visit in 3 day(s).   Specialty: Internal Medicine Contact information: [address] [place] [address] [phone]         Albertus Gordy HERO, MD. Schedule an appointment as soon as possible for a visit in 1 month(s).   Specialty: Gastroenterology Contact information: [address] [place] [address] [phone]                 Major procedures and Radiology Reports - PLEASE review detailed and final reports thoroughly  -       DG Chest Port 1 View Result Date: 11/11/2023 EXAM:  1 VIEW(S) XRAY OF THE CHEST 11/11/2023 06:33:00 AM COMPARISON: 10/30/2023 CLINICAL HISTORY: Shortness of breath FINDINGS: LUNGS AND PLEURA: Low lung volumes. Streaky bibasilar opacities. Mild interstitial edema. Small pleural effusion suspected. No pneumothorax. HEART AND MEDIASTINUM: Aortic atherosclerosis tortuosity. No acute abnormality of the cardiac silhouette. BONES AND SOFT TISSUES: Dextroscoliotic curvature of thoracic spine. No  acute osseous abnormality. IMPRESSION: 1. Mild interstitial edema and a small pleural effusion are suspected. Correlate per any clinical signs or symptoms of congestive heart failure. 2. Low lung volumes with bibasilar streaky opacities favoring atelectasis. Electronically signed by: Waddell Calk MD 11/11/2023 07:37 AM EST RP Workstation: [id number]   CT ABDOMEN PELVIS WO CONTRAST Result Date: 11/05/2023 EXAM: CT ABDOMEN AND PELVIS WITHOUT CONTRAST 11/05/2023 12:03:40 AM TECHNIQUE: CT of the abdomen and pelvis was performed without the administration of intravenous contrast. Multiplanar reformatted images are provided for review. Automated exposure control, iterative reconstruction, and/or weight-based adjustment of the mA/kV was utilized to reduce the radiation dose to as low as reasonably achievable. COMPARISON: None available. CLINICAL HISTORY: Diarrhea; new Skyrizi  infusions, diarrhea, abdominal pain. Diarrhea for 3 weeks, recently started a new medication for Crohn's, GFR 5. Crohn's disease. FINDINGS: LOWER CHEST: Extensive right coronary artery calcification. Global cardiac size within normal limits. LIVER: The liver is unremarkable. GALLBLADDER AND BILE DUCTS: Gallbladder is unremarkable. No biliary ductal dilatation. SPLEEN: No acute abnormality. PANCREAS: No acute abnormality. ADRENAL GLANDS: No acute abnormality. KIDNEYS, URETERS AND BLADDER: No stones in the kidneys or ureters. No hydronephrosis. No perinephric or periureteral stranding. Urinary bladder is unremarkable. GI AND BOWEL: Stomach demonstrates no acute abnormality. The transverse, descending, and rectosigmoid colon demonstrate an ahaustral featureless appearance in keeping with recurrent proctocolitis, compatible with the patient's history of inflammatory bowel disease. There is superimposed mild circumferential bowel wall thickening and pericolonic inflammatory stranding and vascular engorgement involving the transverse and descending  colon in keeping with changes of active colitis. No evidence of obstruction or perforation. No free intraperitoneal gas or fluid. PERITONEUM AND RETROPERITONEUM: No ascites. No free air. VASCULATURE: Moderate aortoiliac atherosclerotic calcification. No aortic aneurysm. LYMPH NODES: No lymphadenopathy. REPRODUCTIVE ORGANS: No acute abnormality. BONES AND SOFT TISSUES: Marked thoracolumbar dextroscoliosis, apex right at T9 and lumbar compensatory levoscoliosis with left lateral listhesis centered at L4-L5 is noted with associated degenerative changes, relatively advanced at L4 - S1. No acute bone abnormality. No lytic or blastic bone lesion. No focal soft tissue abnormality. IMPRESSION: 1. Active colitis involving the transverse and descending colon with mild circumferential wall thickening, pericolonic inflammatory stranding, and vascular engorgement; compatible with Crohns disease history. No obstruction or perforation. No free intraperitoneal gas or fluid. 2. Marked thoracolumbar sigmoid colpocele 3. Extensive right coronary artery calcification. Electronically signed by: Dorethia Molt MD 11/05/2023 12:16 AM EDT RP Workstation: [id number]   DG Chest 2 View Result Date: 11/01/2023 CLINICAL DATA:  Cough EXAM: CHEST - 2 VIEW COMPARISON:  Chest x-ray performed April 16, 2023 FINDINGS: Patient is rotated. Heart mediastinum are not significantly changed. No discrete lobar infiltrate. Similar appearance of mild interstitial prominence. No definite pleural effusion. No pneumothorax. Diffuse osteopenia. IMPRESSION: 1. Similar appearance of bibasilar scarring. 2. No focal consolidation, pleural effusion, or pneumothorax. Electronically Signed   By: Maude Naegeli M.D.   On: 11/01/2023 12:13    Micro Results     Recent Results (from the past 240 hours)  Blood culture (routine x 2)     Status: None   Collection Time: 11/05/23  1:06 AM   Specimen: BLOOD LEFT FOREARM  Result Value Ref Range Status   Specimen  Description   Final    BLOOD LEFT FOREARM Performed at [facility], [address] [place], [place] [address]    Special Requests   Final    Blood Culture results may not be optimal due to an inadequate volume of blood received in culture bottles BOTTLES DRAWN AEROBIC AND ANAEROBIC Performed at [facility], [address], [place], [place] [address]    Culture   Final    NO GROWTH 5 DAYS Performed at [facility], [address] [place], [place] [address]    Report Status 11/10/2023 FINAL  Final  Gastrointestinal Panel by PCR , Stool     Status: Abnormal   Collection Time: 11/05/23  5:56 PM   Specimen: STOOL  Result Value Ref Range Status   Campylobacter species NOT DETECTED NOT DETECTED Final   Plesimonas shigelloides NOT DETECTED NOT DETECTED Final   Salmonella species NOT DETECTED NOT DETECTED Final   Yersinia enterocolitica NOT DETECTED NOT DETECTED Final   Vibrio species NOT DETECTED NOT DETECTED Final   Vibrio cholerae NOT DETECTED NOT DETECTED Final   Enteroaggregative E coli (EAEC) NOT DETECTED NOT DETECTED Final   Enteropathogenic E coli (EPEC) DETECTED (A) NOT DETECTED Final    Comment: RESULT CALLED TO, READ BACK BY AND VERIFIED WITH:  KATHERINE B., RN AT 1651 11/08/23 RAM    Enterotoxigenic E coli (ETEC) NOT DETECTED NOT DETECTED Final   Shiga like toxin producing E coli (STEC) NOT DETECTED NOT DETECTED Final   Shigella/Enteroinvasive E coli (EIEC) NOT DETECTED NOT DETECTED Final   Cryptosporidium NOT DETECTED NOT DETECTED Final   Cyclospora cayetanensis NOT DETECTED NOT DETECTED Final   Entamoeba histolytica NOT DETECTED NOT DETECTED Final   Giardia lamblia NOT DETECTED NOT DETECTED Final   Adenovirus F40/41 NOT DETECTED NOT DETECTED Final   Astrovirus NOT DETECTED NOT DETECTED Final   Norovirus GI/GII NOT DETECTED NOT DETECTED Final   Rotavirus A NOT DETECTED NOT DETECTED Final   Sapovirus (I, II, IV, and V) NOT DETECTED NOT  DETECTED Final    Comment: Performed at [facility], [address] [place], [place] [address]  Blood culture (routine x 2)     Status: None   Collection Time: 11/05/23  9:04 PM   Specimen: BLOOD  Result Value Ref Range Status   Specimen Description BLOOD SITE NOT SPECIFIED  Final   Special Requests   Final    BOTTLES DRAWN AEROBIC AND ANAEROBIC Blood Culture adequate volume   Culture   Final    NO GROWTH 5 DAYS Performed at [facility], [address] [place], [place] [address]    Report Status 11/10/2023 FINAL  Final  C Difficile Quick Screen w PCR reflex     Status: None   Collection Time: 11/06/23  6:31 AM   Specimen: STOOL  Result Value Ref Range Status   C Diff antigen NEGATIVE NEGATIVE Final   C Diff toxin NEGATIVE NEGATIVE Final   C Diff interpretation No C. difficile detected.  Final    Comment: Performed at [facility], [address], [place], [place] [address]  Calprotectin, Fecal     Status: Abnormal   Collection Time: 11/06/23  4:38 PM   Specimen: STOOL  Result Value Ref Range Status   Calprotectin, Fecal 1,190 (H) 0 - 120 ug/g Final    Comment: (NOTE) **Results verified by repeat testing** Concentration  Interpretation   Follow-Up < 5 - 50 ug/g     Normal           None >50 -120 ug/g     Borderline       Re-evaluate in 4-6 weeks    >120 ug/g     Abnormal         Repeat as clinically                                   indicated Performed At: [facility] [address] [place], [place] [id number] Jennette Shorter MD Ey:[phone]     Today   Subjective    [name] today has no headache,no chest abdominal pain,no new weakness tingling or numbness, feels much better wants to go home today.     Objective   Blood pressure 130/88, pulse 91, temperature 99.1 F (37.3 C), temperature source Oral, resp. rate 17, weight 57.6 kg, SpO2 95%.   Intake/Output Summary (Last 24 hours) at 11/12/2023 0842 Last data filed at  11/12/2023 0110 Gross per 24 hour  Intake 480 ml  Output 500 ml  Net -20 ml    Exam  Awake Alert, No new F.N deficits,    [place].AT,PERRAL Supple Neck,   Symmetrical Chest wall movement, Good air movement bilaterally, CTAB RRR,No Gallops,   +ve B.Sounds, Abd Soft, Non tender,  No Cyanosis, Clubbing or edema    Data Review   Recent Labs  Lab 11/08/23 0725 11/09/23 0358 11/10/23 0346 11/11/23 0355 11/12/23 0243  WBC 10.1 13.3* 14.1* 13.3* 15.8*  HGB 9.6* 10.1* 9.6* 9.9* 10.6*  HCT 28.1* 29.1* 29.6* 30.2* 32.7*  PLT 316 340 281 334 392  MCV 84.9 85.3 88.6 89.1 91.1  MCH 29.0 29.6 28.7 29.2 29.5  MCHC 34.2 34.7 32.4 32.8 32.4  RDW 14.6 14.6 14.6 15.0 15.4  LYMPHSABS 1.6 1.0 2.0 2.5 2.0  MONOABS 0.9 0.8 1.0 1.1* 1.2*  EOSABS 0.3 0.1 0.5 0.7* 0.4  BASOSABS 0.1 0.1 0.1 0.1 0.2*    Recent Labs  Lab 11/06/23 0211 11/06/23 0211 11/06/23 1839 11/07/23 0307 11/08/23 0725 11/09/23 0358 11/10/23 0346 11/11/23 0355 11/11/23 0609 11/12/23 0243  NA 134*  --   --  136 141 138 135 135  --  133*  K 4.3  --   --  3.8 3.3* 4.0 3.5 3.2*  --  3.1*  CL 103  --   --  97* 89* 86* 80* 80*  --  81*  CO2 16*  --   --  22 28 32 40* 42*  --  33*  ANIONGAP 15  --   --  17* 24* 20* 15 13  --  19*  GLUCOSE 100*  --   --  91 70 101* 106* 107*  --  107*  BUN 116*  --   --  92* 62* 41* 23 17  --  15  CREATININE 6.96*  --   --  4.47* 2.94* 2.45* 1.93* 1.82*  --  2.07*  AST 13*  --   --   --   --   --   --   --   --   --   ALT 11  --   --   --   --   --   --   --   --   --   ALKPHOS 62  --   --   --   --   --   --   --   --   --  BILITOT 0.8  --   --   --   --   --   --   --   --   --   ALBUMIN 2.6*  --   --  2.6*  --   --   --   --   --   --   CRP  --   --  4.1*  --   --   --   --   --   --   --   BNP  --   --   --   --   --   --   --   --  153.0*  --   MG 1.7  --   --   --  1.2* 2.0 1.4* 1.1*  --  2.9*  PHOS  --    < >  --  2.9 3.4 2.6 1.9* 3.8  --  4.5  CALCIUM  8.6*  --   --  8.2* 7.7*  7.7* 7.5* 7.5*  --  8.8*   < > = values in this interval not displayed.    Total Time in preparing paper work, data evaluation and todays exam - 35 minutes  Signature  -    Lavada Stank M.D on 11/12/2023 at 8:42 AM   -  To page go to [email or web address]

## 2023-11-12 NOTE — Discharge Instructions (Addendum)
 Follow with Primary MD Theophilus Andrews, Tully GRADE, MD in 3-4 days   Get CBC, CMP, Magnesium , 2 view Chest X ray -  checked next visit with your primary MD   Activity: As tolerated with Full fall precautions use walker/cane & assistance as needed  Disposition Home     Diet: Heart Healthy   Special Instructions: If you have smoked or chewed Tobacco  in the last 2 yrs please stop smoking, stop any regular Alcohol  and or any Recreational drug use.  On your next visit with your primary care physician please Get Medicines reviewed and adjusted.  Please request your Prim.MD to go over all Hospital Tests and Procedure/Radiological results at the follow up, please get all Hospital records sent to your Prim MD by signing hospital release before you go home.  If you experience worsening of your admission symptoms, develop shortness of breath, life threatening emergency, suicidal or homicidal thoughts you must seek medical attention immediately by calling 911 or calling your MD immediately  if symptoms less severe.  You Must read complete instructions/literature along with all the possible adverse reactions/side effects for all the Medicines you take and that have been prescribed to you. Take any new Medicines after you have completely understood and accpet all the possible adverse reactions/side effects.   Do not drive when taking Pain medications.  Do not take more than prescribed Pain, Sleep and Anxiety Medications  Wear Seat belts while driving.

## 2023-11-12 NOTE — Telephone Encounter (Signed)
-----   Message from [name] sent at 11/11/2023  9:50 PM EST ----- Regarding: RE: office appt Please add an overbook appointment on Tuesday, November 18 at 11:30 AM  Thanks,  Inocente ----- Message ----- From: Mercer Cristino SAILOR, RN Sent: 11/11/2023   4:58 PM EST To: Greig GORMAN Corti, PA-C; [name], MD Subject: RE: office appt                                Dr. Hausen, your next available appt is 12/2. That will be 4 weeks. Please advise, thanks. ----- Message ----- From: Hausen Inocente CHRISTELLA, MD Sent: 11/11/2023   4:33 PM EST To: Amy S Esterwood, PA-C; [facility] Triage Subject: RE: office appt                                POD B -  If there are no appointments available in the 2 to 3-week timeframe please let me know and I will review my schedules to see where I can overbook her.  Inocente ----- Message ----- From: Corti Greig GORMAN DEVONNA Sent: 11/11/2023   3:38 PM EST To: Elspeth Munroe, RN; [name], MD Subject: office appt                                    This is a patient of Dr. Andy who is currently hospitalized with an acute infectious diarrhea in setting of underlying chronic colitis.  She had reduced recently initiated Skyrizi , and had adverse effect of horrible diarrhea and does not want to take this drug any longer. She will likely be discharged tomorrow/Wednesday or Thursday she will need to be seen by Dr. Hausen within the next 2 to 3 weeks if there is any way to get her a work in appointment I do not want her to be seen by an APP  Thank you  You want to send me a secure chat message with an appointment time I can let her know tomorrow otherwise just call the patient with the appointment thank you very much

## 2023-11-12 NOTE — Progress Notes (Signed)
 AVS given and explained to patient and family. TOC meds ready, awaiting for family to get the ride/car ready.

## 2023-11-12 NOTE — TOC Transition Note (Signed)
 Transition of Care [facility]) - Discharge Note   Patient Details  Name: [name] MRN: [id number] Date of Birth: 02/10/1945  Transition of [facility]) CM/SW Contact:  Marval Gell, RN Phone Number: 11/12/2023, 8:57 AM   Clinical Narrative:     Notified [facility] that patient will DC today. No DME needs, patient has RW at home.   Final next level of care: Home w Home Health Services Barriers to Discharge: No Barriers Identified   Patient Goals and CMS Choice Patient states their goals for this hospitalization and ongoing recovery are:: to go home          Discharge Placement                       Discharge Plan and Services Additional resources added to the After Visit Summary for     Discharge Planning Services: CM Consult            DME Arranged: N/A         HH Arranged: PT, OT HH Agency: [facility] Date [facility] Agency Contacted: 11/11/23 Time HH Agency Contacted: 1149 Representative spoke with at [facility] Agency: [place]  Social Drivers of Health (SDOH) Interventions SDOH Screenings   Food Insecurity: No Food Insecurity (11/05/2023)  Housing: Low Risk  (11/07/2023)  Transportation Needs: No Transportation Needs (11/05/2023)  Utilities: Not At Risk (11/05/2023)  Depression (PHQ2-9): Low Risk  (04/22/2023)  Financial Resource Strain: Low Risk  (04/16/2022)  Physical Activity: Insufficiently Active (04/16/2022)  Social Connections: Unknown (11/05/2023)  Stress: No Stress Concern Present (04/16/2022)  Tobacco Use: Medium Risk (11/04/2023)  Health Literacy: Unknown (08/04/2022)   Received from [facility]     Readmission Risk Interventions    11/04/2022    3:48 PM  Readmission Risk Prevention Plan  Transportation Screening Complete  PCP or Specialist Appt within 5-7 Days Complete  Home Care Screening Complete  Medication Review (RN CM) Referral to Pharmacy

## 2023-11-12 NOTE — Telephone Encounter (Signed)
 Hospital follow up has been scheduled on 11/25/23 at 11:30 am per Dr. Suzann. Secure chat sent to Greig Corti, PA with appt information so that it can be added to discharge paperwork.

## 2023-11-12 NOTE — Plan of Care (Signed)
  Problem: Education: Goal: Knowledge of General Education information will improve Description: Including pain rating scale, medication(s)/side effects and non-pharmacologic comfort measures Outcome: Progressing   Problem: Clinical Measurements: Goal: Diagnostic test results will improve Outcome: Progressing Goal: Respiratory complications will improve Outcome: Progressing   Problem: Activity: Goal: Risk for activity intolerance will decrease Outcome: Progressing   Problem: Safety: Goal: Ability to remain free from injury will improve Outcome: Progressing   

## 2023-11-13 ENCOUNTER — Telehealth: Payer: Self-pay

## 2023-11-13 NOTE — Transitions of Care (Post Inpatient/ED Visit) (Signed)
   11/13/2023  Name: [name] MRN: [id number] DOB: 1945/06/20  Today's TOC FU Call Status: Today's TOC FU Call Status:: Unsuccessful Call (1st Attempt) Unsuccessful Call (1st Attempt) Date: 11/13/23  Attempted to reach the patient regarding the most recent Inpatient/ED visit.  Follow Up Plan: Additional outreach attempts will be made to reach the patient to complete the Transitions of Care (Post Inpatient/ED visit) call.   Alan Ee, RN, BSN, CEN [employer]- Transition of Care Team.  [facility] [phone]

## 2023-11-13 NOTE — Transitions of Care (Post Inpatient/ED Visit) (Signed)
   11/13/2023  Name: [name] MRN: [id number] DOB: [birth year]  Today's TOC FU Call Status: Today's TOC FU Call Status:: Unsuccessful Call (2nd Attempt) Unsuccessful Call (2nd Attempt) Date: 11/13/23  Attempted to reach the patient regarding the most recent Inpatient/ED visit.  Follow Up Plan: Additional outreach attempts will be made to reach the patient to complete the Transitions of Care (Post Inpatient/ED visit) call.  Alan Ee, RN, BSN, CEN [employer]- Transition of Care Team.  [facility] [phone]

## 2023-11-14 ENCOUNTER — Telehealth: Payer: Self-pay

## 2023-11-14 ENCOUNTER — Telehealth: Payer: Self-pay | Admitting: Pediatrics

## 2023-11-14 MED ORDER — AZITHROMYCIN 500 MG PO TABS: 500.0000 mg | ORAL_TABLET | Freq: Every day | ORAL | 0 refills | Status: DC

## 2023-11-14 NOTE — Telephone Encounter (Signed)
 I spoke on the telephone with Ms. Ikard today after reviewing her MyChart message reporting worsening diarrhea and an episode of vomiting since hospital discharge.  She was recently hospitalized at [facility] for diarrhea, dehydration, AKI with creatinine > 7.0.  Laboratory studies noteworthy for EPEC infection.  She was treated with antibiotics while inpatient.  Reports that she has been home for 2 days and is going to the bathroom at least once an hour -more frequently if she eats or drinks.  She went out to a restaurant last night and ate chicken salad which she vomited.  Diarrhea remains watery.  She is attempting to maintain hydration.  Denies nausea or vomiting this morning.  I recommended placing her back on antibiotics targeted towards E. coli and sent in a prescription for azithromycin  500 mg p.o. daily x 7 days to her [facility].  Advised focusing on hydration with Pedialyte or low sugar sports drink such as G2.  I counseled her that if her symptoms are not improving or worsening she should go to the [facility] for labs to check her electrolytes and IV fluid hydration.  May continue Lomotil  and suggested probiotics.  Will request that our office contact her on Monday via telephone to reassess how she is doing.

## 2023-11-14 NOTE — Transitions of Care (Post Inpatient/ED Visit) (Signed)
   11/14/2023  Name: [name] MRN: [id number] DOB: 1946/01/07  Today's TOC FU Call Status: Today's TOC FU Call Status:: Successful TOC FU Call Completed TOC FU Call Complete Date: 11/14/23 Patient's Name and Date of Birth confirmed.  Transition Care Management Follow-up Telephone Call How have you been since you were released from the hospital?: Same (Continues to have diarrhea) Any questions or concerns?: No  Items Reviewed: Did you receive and understand the discharge instructions provided?: Yes  Medications Reviewed Today:  Declined  Medications Reviewed Today   Medications were not reviewed in this encounter    Placed call to patient who reports she is not any better.  Reports that she continues to have diarrhea.  Reviewed purpose of call and patient declines review.  Encouraged patient to call PCP.   Alan Ee, RN, BSN, CEN [employer]- Transition of Care Team.  [facility] [phone]

## 2023-11-17 MED ORDER — DULOXETINE HCL 60 MG PO CPEP: 60.0000 mg | ORAL_CAPSULE | Freq: Two times a day (BID) | ORAL | 0 refills | Status: AC

## 2023-11-17 NOTE — Telephone Encounter (Signed)
Marland Kitchen  Lab orders in epic.

## 2023-11-17 NOTE — Addendum Note (Signed)
 Addended by: Erasmo Vertz N on: 11/17/2023 04:58 PM   Modules accepted: Orders

## 2023-11-17 NOTE — Telephone Encounter (Signed)
 Lm on vm for patient to follow up and see how she was feeling today. Informed patient that I would follow up with her on MyChart as well. Advised patient to call the office or respond to MyChart message, whichever is her preference.

## 2023-11-18 ENCOUNTER — Ambulatory Visit: Payer: Self-pay | Admitting: Pediatrics

## 2023-11-18 ENCOUNTER — Other Ambulatory Visit (INDEPENDENT_AMBULATORY_CARE_PROVIDER_SITE_OTHER)

## 2023-11-18 DIAGNOSIS — I10 Essential (primary) hypertension: Secondary | ICD-10-CM | POA: Diagnosis not present

## 2023-11-18 DIAGNOSIS — K529 Noninfective gastroenteritis and colitis, unspecified: Secondary | ICD-10-CM

## 2023-11-18 DIAGNOSIS — N179 Acute kidney failure, unspecified: Secondary | ICD-10-CM

## 2023-11-18 DIAGNOSIS — E875 Hyperkalemia: Secondary | ICD-10-CM

## 2023-11-18 LAB — BASIC METABOLIC PANEL WITH GFR
BUN: 28 mg/dL — ABNORMAL HIGH (ref 6–23)
CO2: 22 meq/L (ref 19–32)
Calcium: 9.3 mg/dL (ref 8.4–10.5)
Chloride: 106 meq/L (ref 96–112)
Creatinine, Ser: 1.92 mg/dL — ABNORMAL HIGH (ref 0.40–1.20)
GFR: 24.69 mL/min — ABNORMAL LOW (ref >60.00)
Glucose, Bld: 105 mg/dL — ABNORMAL HIGH (ref 70–99)
Potassium: 5.3 meq/L — ABNORMAL HIGH (ref 3.5–5.1)
Sodium: 136 meq/L (ref 135–145)

## 2023-11-18 LAB — HEPATIC FUNCTION PANEL
ALT: 21 U/L (ref 0–35)
AST: 16 U/L (ref 0–37)
Albumin: 3.8 g/dL (ref 3.5–5.2)
Alkaline Phosphatase: 64 U/L (ref 39–117)
Bilirubin, Direct: 0.1 mg/dL (ref 0.0–0.3)
Total Bilirubin: 0.3 mg/dL (ref 0.2–1.2)
Total Protein: 7 g/dL (ref 6.0–8.3)

## 2023-11-18 LAB — CBC
HCT: 29.4 % — ABNORMAL LOW (ref 36.0–46.0)
Hemoglobin: 9.8 g/dL — ABNORMAL LOW (ref 12.0–15.0)
MCHC: 33.2 g/dL (ref 30.0–36.0)
MCV: 88.2 fl (ref 78.0–100.0)
Platelets: 445 K/uL — ABNORMAL HIGH (ref 150.0–400.0)
RBC: 3.34 Mil/uL — ABNORMAL LOW (ref 3.87–5.11)
RDW: 16.2 % — ABNORMAL HIGH (ref 11.5–15.5)
WBC: 8.2 K/uL (ref 4.0–10.5)

## 2023-11-18 LAB — MAGNESIUM: Magnesium: 1.5 mg/dL (ref 1.5–2.5)

## 2023-11-18 LAB — PHOSPHORUS: Phosphorus: 4.7 mg/dL — ABNORMAL HIGH (ref 2.3–4.6)

## 2023-11-18 LAB — TSH: TSH: 0.95 u[IU]/mL (ref 0.35–5.50)

## 2023-11-18 LAB — CORTISOL: Cortisol, Plasma: 7.6 ug/dL

## 2023-11-19 MED ORDER — VIBERZI 75 MG PO TABS: 75.0000 mg | ORAL_TABLET | Freq: Two times a day (BID) | ORAL | 3 refills | Status: DC

## 2023-11-19 NOTE — Addendum Note (Signed)
 Addended by: SUZANN MOM on: 11/19/2023 02:38 PM   Modules accepted: Orders

## 2023-11-20 ENCOUNTER — Other Ambulatory Visit: Payer: Self-pay

## 2023-11-20 MED ORDER — VIBERZI 75 MG PO TABS: 75.0000 mg | ORAL_TABLET | Freq: Two times a day (BID) | ORAL | Status: DC

## 2023-11-21 ENCOUNTER — Other Ambulatory Visit: Payer: Self-pay | Admitting: Internal Medicine

## 2023-11-21 DIAGNOSIS — J302 Other seasonal allergic rhinitis: Secondary | ICD-10-CM

## 2023-11-24 NOTE — Progress Notes (Signed)
 [facility] Return Visit   Referring Provider Theophilus Andrews, Tully GRADE, MD [address] [place],  [place] [address]  Primary Care Provider Theophilus Andrews, Tully GRADE, MD  Patient Profile: [name] is a 78 y.o. female with a past medical history noteworthy for HTN, CKD 3, neuropathy, SCC of the RLE, RA, scoliosis who returns to the [facility] for follow-up of the problem(s) noted below.  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 coli infection (EPEC) 10/2023   History of Present Illness    Discussed the use of AI scribe software for clinical note transcription with the patient, who gave verbal consent to proceed.  History of Present Illness  [name] is a 78 year old female with who returns to the gastroenterology office accompanied by her husband for follow-up of chronic diarrhea and recent hospitalization 10/2023 for EPEC infection  Current GI Meds  Lomotil  Viberzi   Interval History  Chronic diarrhea and EPEC Infection - Bertie began Skyrizi  09/2023 and received 2 induction infusions - Noted worsening diarrhea after first infusion that slowly improved and recurred after second infusion - Shortly after the second infusion she was hospitalized with EPEC and AKI with creatinine 7.9, significant electrolyte abnormalities - Treated inpatient with 5 days of IV Rocephin   - Bowel movements increased in frequency after E. coli infection and Skyrizi  treatment, not returned to baseline - Frequent loose stools, up to three times in two hours - Imodium  has not been effective in controlling symptoms - Due to ongoing symptoms status post hospitalization, labs were checked and appeared to be at  baseline - Prescription provided for 1 week of azithromycin  in the event there was lingering E. Coli - Samples of Viberzi  provided  - Currently on fifth day of Viberzi , uncertain about effectiveness - Uses Lomotil  intermittently, typically two tablets in the morning, two in the afternoon, and two at night; Lomotil  appears to slow bowel movements - Previously tried cholestyramine and Colestid  without success; pills were difficult to swallow  -Husband offers that although symptoms are not back to baseline there has been a gradual reduction in stool frequency - They are traveling for the Thanksgiving holiday and she is concerned about diarrhea in the setting of travel  - Continuing to experience fatigue since her hospitalization  - Attempting to maintain adequate hydration and nutrition - Consumed a full meal the previous night  Previous History/GI Work -[employer] of symptoms she has described includes: -- Chronic diarrhea -- Abdominal pain -- Nausea -- Gas -- Dysphagia  Her record documents multiple GI conditions that could be contributing to her symptoms: -- Crohn's colitis -- IBS -- Previous considerations of EPI, SIBO  Previous modalities employed have included: -- Treatment of Crohn's disease with mesalamine  and budesonide  -- Antibiotics-metronidazole  and Augmentin  presumably for SIBO -- Colestid  --pills were too big to swallow -- Pancreatic enzymes -no benefit -- Uses Lomotil  and Imodium   She was seen in the emergency department 04/16/2023 with complaints of abdominal pain.   CT imaging at that time showed possible inflammation in the terminal ileum with a dilated TI and fecalized material suggestive of a possible evolving bowel obstruction.   She was given a 4-day course of prednisone  and subsequently followed up with our office. Previously on maintenance mesalamine  4.8 g daily -stopped due to ineffectiveness  At the time of her last visit fecal calprotectin was noted  to be elevated at 213 Advised to complete a 73-month taper of budesonide  9 mg, 6 mg, 3 mg  Interval MRE 05/12/2023 - ?  Partial right hemicolectomy and ileocolic anastomosis, normal terminal ileum, featureless, thickened and tethered transverse colon without acute inflammatory changes, normal descending colon  At time of last clinic visit 06/2023 I recommended additional diagnostic studies: -- Celiac panel negative -- Alpha gal panel negative -- Fecal fat and pancreatic elastase normal -- Fecal calprotectin 266 -- SIBO breath test + 08/2023 -- 7 alpha C4 laboratory test pending for evaluation of bile acid malabsorption  EGD and colonoscopy completed 07/2023 EGD - Tortuous esophagus. Dilation performed in the entire esophagus. No evidence of residual paraesophageal hernia.  - LA Grade A esophagitis with no bleeding.  - Normal gastric body, antrum, cardia and gastric fundus. Biopsied.  - A small amount of retained fibrous/vegetable material was present in the stomach. No documented history of gastroparesis.  - Normal duodenal bulb and second portion of the duodenum. Biopsied.  - Tortuosity of esophagus and/or esophagitis could be contributing to patient's reported dysphagia. An esophageal motility disorder remains in the differential diagnosis.  Colonoscopy - Decreased sphincter tone found on digital rectal exam.  - The colonic mucosa appeared diffusely atrophic and scarred but without significantly active inflammation endoscopically. Biopsied. Rule out active IBD, microscopic colitis and dysplasia.  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 Biopsied -rule out dysplasia. - The examined portion of the ileum was normal. Biopsied. - One 5 mm polyp in the rectum, removed with a cold biopsy forceps. Resected and retrieved.  - The distal rectum and anal verge are normal on retroflexion view  Pathology negative for H. pylori and celiac disease Right colon biopsies showed chronic active colitis; chronic  inactive colitis on left colon biopsies Polyps were hyperplastic polyps  GI Review of Symptoms Significant for abdominal pain, diarrhea, nausea, gas. Otherwise negative.  General Review of Systems  Review of systems is significant for the pertinent positives and negatives as listed per the HPI.  Full ROS is otherwise negative.  Inflammatory Bowel Disease History  - Crohn's colitis diagnosed at [facility] in 1997 - Treated with moderate to high dose mesalamine  Lialda  2.4-4.8 g daily - Reported using amoxicillin  periodically for Crohn's flares with benefit in the past but subsequent loss of efficacy  IBD Medication History Mesalamine  agents   Past Medical History   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   Whooping cough 2015   Whooping cough with pneumonia    2017     Past Surgical History   Past Surgical History:  Procedure Laterality Date   BIOPSY  11/05/2022   Procedure: BIOPSY;  Surgeon: Albertus Gordy HERO, MD;  Location: [facility];  Service: Gastroenterology;;   CATARACT EXTRACTION, BILATERAL  2018   with IOC implant    COLONOSCOPY     DEBRIDEMENT AND CLOSURE WOUND Right 10/01/2018   Procedure: Excision of right knee wound with placement of ACell and Pravena;  Surgeon: Lowery Estefana RAMAN, DO;  Location: [facility];  Service: Plastics;  Laterality: Right;  30 min    ESOPHAGOGASTRODUODENOSCOPY (EGD) WITH PROPOFOL  N/A 11/05/2022   Procedure: ESOPHAGOGASTRODUODENOSCOPY (EGD) WITH PROPOFOL ;  Surgeon: Albertus Gordy HERO, MD;  Location: [facility];  Service: Gastroenterology;  Laterality: N/A;   HERNIA REPAIR     knee fracture  Right    NASAL FRACTURE SURGERY     needed b/c she was getting frequent sinus infections    TONSILLECTOMY     TOTAL KNEE ARTHROPLASTY Right 07/28/2018   Procedure: TOTAL KNEE ARTHROPLASTY;  Surgeon: Ernie Cough, MD;  Location: [facility];  Service: Orthopedics;  Laterality: Right;   TOTAL KNEE ARTHROPLASTY Left 03/02/2019   Procedure: TOTAL KNEE ARTHROPLASTY;  Surgeon: Ernie Cough, MD;  Location: [facility];  Service: Orthopedics;  Laterality: Left;  70 mins   UPPER GASTROINTESTINAL ENDOSCOPY     XI ROBOTIC ASSISTED HIATAL HERNIA REPAIR N/A 01/31/2023   Procedure: XI ROBOTIC ASSISTED HIATAL HERNIA REPAIR WITH MESH;  Surgeon: Lyndel Deward PARAS, MD;  Location: [facility];  Service: General;  Laterality: N/A;     Allergies and Medications   Allergies  Allergen Reactions   Aspirin Other (See Comments)    Intestinal Bleeding   Latex Other (See Comments)    Redness and skin peeling   Other Itching    Nuts-itching in throat  Seeds-stomach issues with chrons  Pt has emphysema    Skyrizi  [Risankizumab ]     Excessive Diarrhea   Sulfa Antibiotics Nausea And Vomiting, Nausea Only, Other (See Comments) and Swelling   Adhesive [Tape] Other (See Comments)    Redness and skin peeling   Ciprofloxacin Nausea And Vomiting   Codeine Nausea And Vomiting   Demerol [Meperidine Hcl] Nausea And Vomiting   Fentanyl  Nausea And Vomiting    Makes me very sick   Lactose Intolerance (Gi) Other (See Comments)    Pt has Crohn's    Prednisone  Other (See Comments)    Upset stomach   Septra [Sulfamethoxazole-Trimethoprim] Nausea Only   Current Meds  Medication Sig   azelastine  (ASTELIN ) 0.1 % nasal spray Place 2 sprays into both nostrils 2 (two) times  daily. Use in each nostril as directed   azithromycin  (ZITHROMAX ) 500 MG tablet Take 1 tablet (500 mg total) by mouth daily.   Calcium  Citrate-Vitamin D (CALCIUM  + D PO) Take 1 tablet by mouth 2 (two) times daily.   cetirizine  (ZYRTEC ) 10 MG tablet TAKE 1 TABLET BY MOUTH EVERY DAY   dicyclomine  (BENTYL ) 20 MG tablet Take 1 tablet (20 mg total) by mouth 4 (four) times daily as needed for spasms.   diphenoxylate -atropine  (LOMOTIL ) 2.5-0.025 MG tablet Take 2 tablets by mouth 3 (three) times daily. TAKE 2 TABLETS BY MOUTH IN THE MORNING ,AT NOON, AND AT BEDTIME AS DIRECTED   DULoxetine  (CYMBALTA ) 60 MG capsule Take 1 capsule (60 mg total) by mouth 2 (two) times daily.   Eluxadoline  (VIBERZI ) 75 MG TABS Take 1 tablet (75 mg total) by mouth 2 (two) times daily.   Eluxadoline  (VIBERZI ) 75 MG TABS Take 1 tablet twice a day.   fluconazole  (DIFLUCAN ) 150 MG tablet Take 1 tablet (150 mg total) by mouth daily.  fluticasone  (FLONASE ) 50 MCG/ACT nasal spray SPRAY 2 SPRAYS INTO EACH NOSTRIL EVERY DAY   Iron , Ferrous Sulfate , 325 (65 Fe) MG TABS Take 325 mg by mouth at bedtime.   Multiple Vitamin (MULTIVITAMIN WITH MINERALS) TABS tablet Take 1 tablet by mouth daily.   omeprazole  (PRILOSEC) 40 MG capsule Take 1 capsule (40 mg total) by mouth daily.   ondansetron  (ZOFRAN ) 4 MG tablet Take 1 tablet (4 mg total) by mouth every 8 (eight) hours as needed for nausea or vomiting.   VIBERZI  75 MG TABS Take 1 tablet (75 mg total) by mouth 2 (two) times daily before a meal. LOT #[id number] EXP: 04/06/2024     Family His   Family History  Problem Relation Age of Onset   Diabetes Father    Parkinson's disease Father    Colon cancer Other    Asthma Neg Hx    Esophageal cancer Neg Hx    Pancreatic cancer Neg Hx    Liver disease Neg Hx    Stomach cancer Neg Hx     Social History   Social History   Tobacco Use   Smoking status: Former    Current packs/day: 0.00    Average packs/day: 1 pack/day for 30.0 years  (30.0 ttl pk-yrs)    Types: Cigarettes    Start date: 64    Quit date: 1990    Years since quitting: 35.9   Smokeless tobacco: Never  Vaping Use   Vaping status: Never Used  Substance Use Topics   Alcohol use: Not Currently   Drug use: Never   Dali reports that she quit smoking about 35 years ago. Her smoking use included cigarettes. She started smoking about 65 years ago. She has a 30 pack-year smoking history. She has never used smokeless tobacco. She reports that she does not currently use alcohol. She reports that she does not use drugs.  Vital Signs and Physical Examination   Vitals:   11/25/23 1128  BP: 130/70  Pulse: 81     Body mass index is 23.05 kg/m. Weight: 118 lb (53.5 kg)  General: Petite elderly female in no acute distress Head: Normocephalic and atraumatic Eyes: Sclerae anicteric, EOMI Lungs: Clear throughout to auscultation Heart: Regular rate and rhythm; No murmurs, rubs or bruits Abdomen: Soft, tender in mid abdomen and non distended. No masses, hepatosplenomegaly or hernias noted. Normal Bowel sounds Rectal: Deferred Musculoskeletal: Symmetrical with no gross deformities     Review of Data   The following data was reviewed at the time of this encounter:   Laboratory Studies      Latest Ref Rng & Units 11/18/2023   12:11 PM 11/12/2023    2:43 AM 11/11/2023    3:55 AM  CBC  WBC 4.0 - 10.5 K/uL 8.2  15.8  13.3   Hemoglobin 12.0 - 15.0 g/dL 9.8  89.3  9.9   Hematocrit 36.0 - 46.0 % 29.4  32.7  30.2   Platelets 150.0 - 400.0 K/uL 445.0  392  334     Lab Results  Component Value Date   LIPASE 58 (H) 11/04/2023      Latest Ref Rng & Units 11/18/2023   12:11 PM 11/12/2023    2:43 AM 11/11/2023    3:55 AM  CMP  Glucose 70 - 99 mg/dL 894  892  892   BUN 6 - 23 mg/dL 28  15  17    Creatinine 0.40 - 1.20 mg/dL 8.07  7.92  8.17   Sodium 135 -  145 mEq/L 136  133  135   Potassium 3.5 - 5.1 mEq/L 5.3 No hemolysis seen  3.1  3.2   Chloride 96  - 112 mEq/L 106  81  80   CO2 19 - 32 mEq/L 22  33  42   Calcium  8.4 - 10.5 mg/dL 9.3  8.8  7.5   Total Protein 6.0 - 8.3 g/dL 7.0     Total Bilirubin 0.2 - 1.2 mg/dL 0.3     Alkaline Phos 39 - 117 U/L 64     AST 0 - 37 U/L 16     ALT 0 - 35 U/L 21      Lab Results  Component Value Date   CRP 4.1 (H) 11/06/2023   Lab Results  Component Value Date   TSH 0.95 11/18/2023   Labs 06/2023 -- Celiac panel negative -- Alpha gal panel negative -- Fecal fat and pancreatic elastase normal -- Fecal calprotectin 266 -- SIBO breath test + 08/2023 -- 7 alpha C4 laboratory test pending for evaluation of bile acid malabsorption   IBD Labs  Prebiologic Labs 04/2023 Hepatitis B surface antigen negative Hepatitis B surface antibody negative Hepatitis B core antibody negative QuantiFERON gold negative  Therapeutic Drug Monitoring  Thiopurine metabolite levels:  Date:                6-TGN       6-MMP  Biologic level and antibodies:  Fecal Calprotectin    Imaging Studies  CTAP 10/27/2023 1. Active colitis involving the transverse and descending colon with mild circumferential wall thickening, pericolonic inflammatory stranding, and vascular engorgement; compatible with Crohns disease history. No obstruction or perforation. No free intraperitoneal gas or fluid. 2. Marked thoracolumbar sigmoid colpocele 3. Extensive right coronary artery calcification.  MRE 05/12/2023 1. Appearance of the colon suggests partial right hemicolectomy and ileocolic anastomosis. 2. Interval resolution of distended, fecalized loops of neoterminal ileum seen on prior examination. No specifically visible evidence of stricture or obstruction on today's examination. 3. The remnant distal ascending and transverse colon remains diffusely featureless, thickened, and tethered, however without substantial mucosal hyperenhancement or acute inflammatory findings on today's examination. 4. The descending colon is  relatively normal in appearance, and the sigmoid colon and rectum are decompressed although somewhat featureless and hyperenhancing. 5. Findings are consistent with chronic and postoperative stigmata of Crohn's disease with some evidence of active inflammation involving the distal colon. No evidence of complicating stricture, obstruction, fistula, or abscess on today's examination.  CTAP 04/17/2023 1. Terminal ileitis with dilated terminal ileum filled with fecalized material suggestive of developing obstruction. 2. Associated irregular bowel wall thickening of the proximal transverse colon in the setting of a likely partial right colectomy. Recommend colonoscopy status post treatment and status post complete resolution of inflammatory changes to exclude an underlying lesion. 3. Gastric lumen filled with multiple oval hyperdensities. Correlate with ingestion of medicine or candies. 4. Aortic Atherosclerosis (ICD10-I70.0) -including four-vessel coronary calcification.  UGI 03/06/2023 Extensive tertiary contractions are noted in middle and distal portions of the esophagus suggesting presbyesophagus.   Moderate to severe persistent narrowing of gastroesophageal junction is noted; potentially this may be due to postoperative edema given recent surgery. 13 mm barium tablet was delayed at this level and not visualized to pass into the stomach.   There was very slow transit of contrast from stomach into duodenum. This may simply be due to motility disorder. Visualized portion of duodenum is unremarkable.   Mild gastric wall thickening is noted in  body of stomach which may be due to recent surgery or may represent some degree of gastritis.  CTAP 11/04/2022 1. Wall thickening and mild mucosal hyperenhancement about the right and transverse colon compatible with colitis. This has slightly progressed compared to CT 09/24/2022. 2. Large hiatal hernia with intrathoracic stomach. 3.  Fecalized ileum compatible with slow transit. No dilation to suggest obstruction.  MRI/MRCP 10/18/2022 1. Limited exam. 2. Small/atrophic pancreas with slightly prominent main pancreatic duct measuring up to 3-4 mm. 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 Recommend follow up pre and post contrast MRI/MRCP in 2 years. 3. Partially exophytic 2.4 cm proteinaceous/hemorrhagic cyst arising from the left kidney lower pole. 4. Multiple other nonacute observations, as described above.  CTAP 09/24/2022 1. Short segment mural thickening of the hepatic flexure, which may be due to underdistention or colitis. 2. Trace free fluid adjacent to the pancreatic tail in the left upper quadrant, which is nonspecific but may be seen in the setting of acute pancreatitis. 3. Large hiatal hernia with nearly the entire stomach within the left chest. 4. Aortic Atherosclerosis (ICD10-I70.0) and Emphysema (ICD10-J43.9). Coronary artery calcifications. Assessment for potential risk factor modification, dietary therapy or pharmacologic therapy may be warranted, if clinically indicated.   GI Procedures and Studies  EGD/Colonoscopy 07/29/2023 EGD - Tortuous esophagus. Dilation performed in the entire esophagus. No evidence of residual paraesophageal hernia.  - LA Grade A esophagitis with no bleeding.  - Normal gastric body, antrum, cardia and gastric fundus. Biopsied.  - A small amount of retained fibrous/vegetable material was present in the stomach. No documented history of gastroparesis.  - Normal duodenal bulb and second portion of the duodenum. Biopsied.  - Tortuosity of esophagus and/or esophagitis could be contributing to patient's reported dysphagia. An esophageal motility disorder  remains in the differential diagnosis.  Colonoscopy - Decreased sphincter tone found on digital rectal exam.  - The colonic mucosa appeared diffusely atrophic and scarred but without significantly active inflammation endoscopically. Biopsied. Rule out active IBD, microscopic colitis and dysplasia.  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 Biopsied -rule out dysplasia. - The examined portion of the ileum was normal. Biopsied. - One 5 mm polyp in the rectum, removed with a cold biopsy forceps. Resected and retrieved.  - The distal rectum and anal verge are normal on retroflexion view  Path:   1. Surgical [P], duodenal :      - SMALL BOWEL MUCOSA WITH NO SPECIFIC PATHOLOGIC CHANGE.      - NEGATIVE FOR INTRAEPITHELIAL LYMPHOCYTOSIS OR VILLOUS BLUNTING.       2. Surgical [P], gastric :      - BENIGN GASTRIC MUCOSA WITH CHANGES SUGGESTIVE OF PPI EFFECT.      - NO EVIDENCE OF H. PYLORI ON H&E STAIN.       3. Surgical [P], small bowel, terminal ileum :      - SMALL BOWEL MUCOSA WITH NO SPECIFIC PATHOLOGIC CHANGE.       4. Surgical [P], colon, ascending :      - CHRONIC COLITIS WITH FOCAL ACTIVITY.      - NEGATIVE FOR DYSPLASIA.       5. Surgical [P], ascending colon stenosis :      - CHRONIC ACTIVE COLITIS WITH MILD ACTIVITY.      -  NEGATIVE FOR DYSPLASIA.       6. Surgical [P], colon, transverse :      - CHRONIC ACTIVE COLITIS WITH MILD ACTIVITY.      - NEGATIVE FOR DYSPLASIA.       7. Surgical [P], colon, descending :      - CHRONIC INACTIVE COLITIS.      - NEGATIVE FOR DYSPLASIA.       8. Surgical [P], colon, rectosigmoid :      - CHRONIC INACTIVE COLITIS.      - HYPERPLASTIC POLYP.      - NEGATIVE FOR DYSPLASIA.       9. Surgical [P], colon, rectum, polyp (1) :      - HYPERPLASTIC POLYP.  EGD 11/05/2022 - Tortuous esophagus - Large HH - > 50% of stomach is intrathoracic - Congested and erythematous mucosa in the gastric fundus and body, most likely secondary to [facility] - Normal  duodenum  Path: A. STOMACH, BIOPSY:  Gastric oxyntic mucosa with focal hyperemia.  Negative for Helicobacter pylori.   Colonoscopy 11/23/2020  - Normal ileum - Changes of chronic colitis with trivial patchy inflammation status post biopsies  Path: 1. Surgical [P], right colon biopsy - MILDLY ACTIVE CHRONIC COLITIS, CONSISTENT WITH PATIENT'S CLINICAL HISTORY OF CROHN'S DISEASE - NEGATIVE FOR GRANULOMAS OR DYSPLASIA 2. Surgical [P], transverse colon biopsy - MILDLY ACTIVE CHRONIC COLITIS, CONSISTENT WITH PATIENT'S CLINICAL HISTORY OF CROHN'S DISEASE - NEGATIVE FOR GRANULOMAS OR DYSPLASIA 3. Surgical [P], left colon biopsy - INACTIVE CHRONIC COLITIS, CONSISTENT WITH PATIENT'S CLINICAL HISTORY OF CROHN'S DISEASE - NEGATIVE FOR GRANULOMAS OR DYSPLASIA  Colonoscopy 06/05/2017 The ileum was normal. Chronic colitis noted in the left colon. Diminutive pseudopolyp in the cecum. Mild internal hemorrhoids. Biopsies of the cecal polyp were a benign lymphoid aggregate. Biopsies in the right colon were normal. Biopsies in the left colon revealed inactive chronic colitis. The patient was told to take Lialda  4.8 g daily for 8 weeks then 2.4 g daily thereafter.   Clinical Impression  It is my clinical impression that Ms. Devoss is a 78 y.o. 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 coli infection (EPEC) in 2025  Ms. Gilcrest has baseline gastrointestinal symptoms of chronic diarrhea, abdominal pain, gas, nausea, fecal urgency which she feels are overall negatively impacting her quality of life.  At the time of her last visit we discussed that she has multiple concomitant medical conditions that could be contributing to symptoms including Crohn's colitis, IBS.  I recommended further  evaluation of other etiologies that could be playing a role in her symptoms with laboratory studies, stool tests, EGD and colonoscopy.  These tests ruled out celiac disease, H. pylori infection, pancreatic exocrine insufficiency, alpha gal syndrome.  Laboratory testing with 7 alpha C4 remains pending for bile acid malabsorption.  Reviewed possibility of trialing Viberzi  if symptoms persist in the future.  Ms.Pinheiro's colonoscopy did show evidence of a stenosis in the proximal ascending colon likely a sequelae of her Crohn's disease.  Biopsies were negative for dysplasia but did show active inflammation.  In light of her stenosing Crohn's phenotype, we discussed initiation of biologic therapy.  Given her age and risks of immune suppression, I have recommended anti-IL 12/23 therapy.  Discussed data for Stelara, Skyrizi  and Tremfya.  At this time I have recommended initiating treatment with Skyrizi   both for its effectiveness as well as limited risk of infection/immune suppression in her age group.  Ms. Marksberry was amenable to proceeding with treatment.  Since her last visit she also underwent breath testing and was found to have a positive breath test for SIBO.  I have recommended proceeding with antibiotic treatment in the form of rifaximin .  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  She does continue to endorse some pain in her left shoulder that she thinks may be related.  She has had intermittent symptoms of dysphagia.  Recent EGD did not show recurrence of her paraesophageal hernia.  Empiric savory dilation was performed to 16 mm with acceptable results.  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  Last imaging performed 10/2022 recommended follow-up imaging in 2 years-2026.  Plan  Initiate Crohn's treatment with Skyrizi  induction at 600 mg IV every 4 weeks x 3, then 360 mg SQ Q 8 weeks Prescribe rifaximin  550 mg p.o. 3 times  daily x 14 days for SIBO documented by breath test; extend course if there is partial response Continue budesonide  taper  -anticipate ceasing budesonide  after initiation of Skyrizi  Continue Lomotil  and Imodium  Follow-up results of 7 alpha C4 testing Consider trial of Viberzi  if symptoms persist in the future. Continue omeprazole  40 mg orally daily Continue low fiber diet given stenosing Crohn's disease Recommended iron  supplementation after last visit Monitor weight and anthropometrics Repeat MRI/MRCP for follow-up of IPMN 10/2024  Planned Follow Up 4 months  The patient or caregiver verbalized understanding of the material covered, with no barriers to understanding. All questions were answered. Patient or caregiver is agreeable with the plan outlined above.    It was a pleasure to see [place].  If you have any questions or concerns regarding this evaluation, do not hesitate to contact me.  Inocente Hausen, MD [facility]   I spent total of 40 minutes in both face-to-face (25 minutes interview) and non-face-to-face (15 minutes chart review, care coordination, documentation)  activities, excluding procedures performed, for the visit on the date of this encounter.

## 2023-11-25 ENCOUNTER — Encounter: Payer: Self-pay | Admitting: Pediatrics

## 2023-11-25 ENCOUNTER — Telehealth: Payer: Self-pay

## 2023-11-25 ENCOUNTER — Ambulatory Visit (INDEPENDENT_AMBULATORY_CARE_PROVIDER_SITE_OTHER): Admitting: Pediatrics

## 2023-11-25 ENCOUNTER — Other Ambulatory Visit (HOSPITAL_COMMUNITY): Payer: Self-pay

## 2023-11-25 VITALS — BP 130/70 | HR 81 | Ht 60.0 in | Wt 118.0 lb

## 2023-11-25 DIAGNOSIS — K50919 Crohn's disease, unspecified, with unspecified complications: Secondary | ICD-10-CM

## 2023-11-25 DIAGNOSIS — K638219 Small intestinal bacterial overgrowth, unspecified: Secondary | ICD-10-CM | POA: Diagnosis not present

## 2023-11-25 DIAGNOSIS — K529 Noninfective gastroenteritis and colitis, unspecified: Secondary | ICD-10-CM | POA: Diagnosis not present

## 2023-11-25 DIAGNOSIS — A498 Other bacterial infections of unspecified site: Secondary | ICD-10-CM

## 2023-11-25 DIAGNOSIS — K219 Gastro-esophageal reflux disease without esophagitis: Secondary | ICD-10-CM

## 2023-11-25 MED ORDER — AZITHROMYCIN 500 MG PO TABS
500.0000 mg | ORAL_TABLET | Freq: Every day | ORAL | 0 refills | Status: AC
Start: 2023-11-25 — End: ?

## 2023-11-25 MED ORDER — VIBERZI 75 MG PO TABS: ORAL_TABLET | ORAL | 0 refills | Status: DC

## 2023-11-25 MED ORDER — FLUCONAZOLE 150 MG PO TABS
150.0000 mg | ORAL_TABLET | Freq: Every day | ORAL | 0 refills | Status: AC
Start: 2023-11-25 — End: ?

## 2023-11-25 NOTE — Telephone Encounter (Signed)
 Pharmacy Patient Advocate Encounter   Received notification from Pt Calls Messages that prior authorization for Viberzi 75MG  tablets is required/requested.   Insurance verification completed.   The patient is insured through [facility]   Per test claim: PA required; PA submitted to above mentioned insurance via Latent Key/confirmation #/EOC [id number] Status is pending

## 2023-11-25 NOTE — Patient Instructions (Signed)
 We have given you samples of the following medication to take: Viberzi 75 mg, take 1 tablet twice a day.  We have sent the following medications to your pharmacy for you to pick up at your convenience: Azithromycin  500 mg, take 1 tablet daily for 30 days.  Diflucan  150 mg, take 1 tablet for vaginal yeast infection.  Follow up in 8 weeks.  Thank you for entrusting me with your care and for choosing [facility], Dr. Inocente Hausen  _______________________________________________________  If your blood pressure at your visit was 140/90 or greater, please contact your primary care physician to follow up on this.  _______________________________________________________  If you are age 70 or older, your body mass index should be between 23-30. Your Body mass index is 23.05 kg/m. If this is out of the aforementioned range listed, please consider follow up with your Primary Care Provider.  If you are age 35 or younger, your body mass index should be between 19-25. Your Body mass index is 23.05 kg/m. If this is out of the aformentioned range listed, please consider follow up with your Primary Care Provider.   ________________________________________________________  The [facility] providers would like to encourage you to use MYCHART to communicate with providers for non-urgent requests or questions.  Due to long hold times on the telephone, sending your provider a message by [facility] may be a faster and more efficient way to get a response.  Please allow 48 business hours for a response.  Please remember that this is for non-urgent requests.  _______________________________________________________  [facility] is using a team-based approach to care.  Your team is made up of your doctor and two to three APPS. Our APPS (Nurse Practitioners and Physician Assistants) work with your physician to ensure care continuity for you. They are fully qualified to address your health concerns  and develop a treatment plan. They communicate directly with your gastroenterologist to care for you. Seeing the Advanced Practice Practitioners on your physician's team can help you by facilitating care more promptly, often allowing for earlier appointments, access to diagnostic testing, procedures, and other specialty referrals.

## 2023-11-25 NOTE — Telephone Encounter (Signed)
 PA request has been Submitted. New Encounter has been or will be created for follow up. For additional info see Pharmacy Prior Auth telephone encounter from 11/25/2023.

## 2023-11-25 NOTE — Telephone Encounter (Signed)
-----   Message from [name] sent at 11/25/2023 12:26 PM EST ----- Regarding: Viberzi Denial Hi POD B -  I saw [name] in clinic today and she informed that [facility] told her Holmes was denied by her insurance.   Please let me know if we have received any information about this and if there is option for prior authorization or appeal of denial.  I gave her a couple more weeks of samples in the clinic today so she has enough to keep her covered for a while.  Thanks,  Inocente

## 2023-11-26 NOTE — Telephone Encounter (Signed)
 See alternate telephone encounter

## 2023-11-26 NOTE — Telephone Encounter (Signed)
 Pharmacy Patient Advocate Encounter  Received notification from [facility] Medicare that Prior Authorization for  Viberzi 75MG  tablets  has been DENIED.  Full denial letter will be uploaded to the media tab. See denial reason below.  Medication authorization requires the following:  (1) You need to try two (2) of these covered drugs (A) Amitriptyline (B) Desipramine (C) Doxepin capsule (D) Imipramine hydrochloride or imipramine pamoate (E) Nortriptyline (F) Trimipramine (2) OR your doctor needs to give us  specific medical reasons why two (2) of the covered drug(s) are not appropriate for you  PA #/Case ID/Reference #: [id number]

## 2023-11-28 ENCOUNTER — Encounter: Payer: Self-pay | Admitting: Pediatrics

## 2023-11-28 ENCOUNTER — Ambulatory Visit

## 2023-12-10 MED ORDER — AMITRIPTYLINE HCL 10 MG PO TABS: 10.0000 mg | ORAL_TABLET | Freq: Every day | ORAL | 0 refills | Status: DC

## 2023-12-10 NOTE — Telephone Encounter (Signed)
 Amitriptyline prescription sent to pharmacy on file

## 2023-12-10 NOTE — Addendum Note (Signed)
 Addended by: Wilma Wuthrich N on: 12/10/2023 02:48 PM   Modules accepted: Orders

## 2023-12-15 NOTE — Telephone Encounter (Signed)
 See 11/21 patient message for details. Patient responded well to Viberzi  but is currently trialing Amitriptyline .

## 2023-12-22 MED ORDER — NORTRIPTYLINE HCL 10 MG PO CAPS: 10.0000 mg | ORAL_CAPSULE | Freq: Every day | ORAL | 0 refills | Status: AC

## 2023-12-22 NOTE — Addendum Note (Signed)
 Addended by: SUZANN MOM on: 12/22/2023 06:27 PM   Modules accepted: Orders

## 2023-12-23 ENCOUNTER — Encounter: Payer: Self-pay | Admitting: Internal Medicine

## 2023-12-23 DIAGNOSIS — J302 Other seasonal allergic rhinitis: Secondary | ICD-10-CM

## 2023-12-23 MED ORDER — FLUTICASONE PROPIONATE 50 MCG/ACT NA SUSP: NASAL | 1 refills | Status: AC

## 2023-12-23 MED ORDER — CETIRIZINE HCL 10 MG PO TABS: 10.0000 mg | ORAL_TABLET | Freq: Every day | ORAL | 1 refills | Status: AC

## 2023-12-25 ENCOUNTER — Other Ambulatory Visit: Payer: Self-pay

## 2023-12-25 DIAGNOSIS — K50919 Crohn's disease, unspecified, with unspecified complications: Secondary | ICD-10-CM

## 2023-12-25 DIAGNOSIS — K529 Noninfective gastroenteritis and colitis, unspecified: Secondary | ICD-10-CM

## 2023-12-25 NOTE — Addendum Note (Signed)
 Addended by: CONCHA NORRIS A on: 12/25/2023 11:03 AM   Modules accepted: Orders

## 2023-12-26 ENCOUNTER — Other Ambulatory Visit

## 2023-12-26 DIAGNOSIS — K50919 Crohn's disease, unspecified, with unspecified complications: Secondary | ICD-10-CM

## 2023-12-26 DIAGNOSIS — K529 Noninfective gastroenteritis and colitis, unspecified: Secondary | ICD-10-CM

## 2023-12-30 ENCOUNTER — Telehealth: Payer: Self-pay

## 2023-12-30 ENCOUNTER — Other Ambulatory Visit: Payer: Self-pay

## 2023-12-30 DIAGNOSIS — K529 Noninfective gastroenteritis and colitis, unspecified: Secondary | ICD-10-CM

## 2023-12-30 NOTE — Telephone Encounter (Signed)
-----   Message from Inocente Hausen, MD sent at 12/29/2023  7:08 AM EST ----- Hi Pod B -  I am following up on stool test orders from his referral last week.  I ordered the [facility] stool pathogen panel.  I wanted to order the Targeted panel 1 but the orders Coming up as Targeted panel 2.  I would appreciate it if you can please follow-up on the orders today and confirm that the Targeted panel 1 has been ordered.  Thanks!  Inocente

## 2023-12-30 NOTE — Telephone Encounter (Signed)
 New order has been placed & requisition form faxed to [facility]. Spoke with rep Asberry & she said results should be posted by the end of the day for Targeted 1 panel.

## 2023-12-31 ENCOUNTER — Telehealth: Payer: Self-pay | Admitting: Gastroenterology

## 2023-12-31 NOTE — Telephone Encounter (Signed)
 Attempted to call patient Dr. Vickie to follow-up on her symptoms.  No answer left voicemail and also sent MyChart message.

## 2024-01-01 ENCOUNTER — Encounter: Payer: Self-pay | Admitting: Pediatrics

## 2024-01-07 ENCOUNTER — Other Ambulatory Visit (HOSPITAL_COMMUNITY): Payer: Self-pay

## 2024-01-07 ENCOUNTER — Telehealth: Payer: Self-pay

## 2024-01-07 NOTE — Telephone Encounter (Signed)
 PA request has been Submitted. New Encounter has been or will be created for follow up. For additional info see Pharmacy Prior Auth telephone encounter from 01-07-2024.

## 2024-01-07 NOTE — Telephone Encounter (Signed)
 Pharmacy Patient Advocate Encounter   Received notification from Patient Advice Request messages that prior authorization for Viberzi  75MG  tablets is required/requested.   Insurance verification completed.   The patient is insured through [facility].   Per test claim: PA required; PA submitted to above mentioned insurance via Latent Key/confirmation #/EOC [id number] Status is pending

## 2024-01-09 ENCOUNTER — Other Ambulatory Visit: Payer: Self-pay

## 2024-01-09 ENCOUNTER — Other Ambulatory Visit (HOSPITAL_COMMUNITY): Payer: Self-pay

## 2024-01-09 MED ORDER — VIBERZI 75 MG PO TABS: 75.0000 mg | ORAL_TABLET | Freq: Two times a day (BID) | ORAL | 3 refills | Status: DC

## 2024-01-09 NOTE — Addendum Note (Signed)
 Addended by: Zylan Almquist H on: 01/09/2024 08:48 AM   Modules accepted: Orders

## 2024-01-09 NOTE — Telephone Encounter (Signed)
 Pharmacy Patient Advocate Encounter  Received notification from [facility] Medicare that Prior Authorization for Viberzi  75MG  tablets has been APPROVED from 01-08-2024 to 01-06-2025   PA #/Case ID/Reference #: [id number]

## 2024-01-09 NOTE — Telephone Encounter (Signed)
 Pt is aware.

## 2024-01-12 ENCOUNTER — Encounter (HOSPITAL_COMMUNITY): Payer: Self-pay | Admitting: Pharmacist

## 2024-01-12 ENCOUNTER — Other Ambulatory Visit (HOSPITAL_COMMUNITY): Payer: Self-pay

## 2024-01-12 ENCOUNTER — Telehealth: Payer: Self-pay

## 2024-01-12 MED ORDER — VIBERZI 75 MG PO TABS
75.0000 mg | ORAL_TABLET | Freq: Two times a day (BID) | ORAL | 3 refills | Status: AC
  Filled 2024-01-12 – 2024-01-15 (×2): qty 60, 30d supply, fill #0

## 2024-01-12 NOTE — Telephone Encounter (Signed)
Rx faxed to [facility]  

## 2024-01-12 NOTE — Telephone Encounter (Signed)
 I called [name] and I left a detailed message explaining that we don't have any samples available but we can send it to [facility] because they can get it in tomorrow.  I advised her to call me with any questions.

## 2024-01-13 ENCOUNTER — Other Ambulatory Visit: Payer: Self-pay

## 2024-01-13 ENCOUNTER — Other Ambulatory Visit (HOSPITAL_COMMUNITY): Payer: Self-pay

## 2024-01-13 NOTE — Telephone Encounter (Signed)
 Call from pt stating pharmacy will not fill rx refill Eluxadoline  (VIBERZI ) 75 MG TABS. Pt requesting alternative until rx can be filled Please advise thank you.

## 2024-01-14 ENCOUNTER — Other Ambulatory Visit (HOSPITAL_COMMUNITY): Payer: Self-pay

## 2024-01-15 ENCOUNTER — Other Ambulatory Visit (HOSPITAL_COMMUNITY): Payer: Self-pay

## 2024-01-16 ENCOUNTER — Telehealth (HOSPITAL_COMMUNITY): Payer: Self-pay | Admitting: Pharmacist

## 2024-01-16 NOTE — Telephone Encounter (Signed)
 Skyrizi   discontinued. Patient had side effects/allerigc response  Sherry Pennant, PharmD, MPH, BCPS, CPP Clinical Pharmacist

## 2024-01-16 NOTE — Telephone Encounter (Addendum)
 I spoke to [employer] and they advised that the patient's ins denied the refill because it was filled at another pharmacy.  [facility] called [facility] and they advised that the patient picked up the medication on 1/06.

## 2024-01-18 NOTE — Progress Notes (Unsigned)
 "  [facility] Return Visit   Referring Provider Theophilus Andrews, Tully GRADE, MD [address] [place],  [place] [address]  Primary Care Provider Theophilus Andrews, Tully GRADE, MD  Patient Profile: [name] is a 79 y.o. female with a past medical history noteworthy for HTN, CKD 3, neuropathy, SCC of the RLE, RA, scoliosis who returns to the [facility] for follow-up of the problem(s) noted below.  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 coli infection (EPEC) 10/2023   History of Present Illness    Discussed the use of AI scribe software for clinical note transcription with the patient, who gave verbal consent to proceed. History of Present Illness  [name] is a 79 year old female with who returns to the gastroenterology office accompanied by her husband for follow-up of multiple gastrointestinal issues as outlined below:  Previous History/GI Work -[employer] of symptoms she has described includes: -- Chronic diarrhea -- Abdominal pain -- Nausea -- Gas -- Dysphagia  Her record documents multiple GI conditions that could be contributing to her symptoms: -- Crohn's colitis -- IBS -- Positive breath test for SIBO  Diagnostic studies 06/2023 -- Celiac panel negative -- Alpha gal panel negative -- Fecal fat and pancreatic elastase normal -- Fecal calprotectin 266 -- SIBO breath test + 08/2023 -- 7 alpha C4  normal  11/2023 -- Cortisol normal -- TSH normal  MRE 05/12/2023 - ?  Partial right hemicolectomy and ileocolic anastomosis, normal terminal ileum, featureless, thickened and tethered transverse colon without acute inflammatory changes, normal descending colon  EGD and colonoscopy  completed 07/2023 EGD - Tortuous esophagus. Dilation performed in the entire esophagus. No evidence of residual paraesophageal hernia.  - LA Grade A esophagitis with no bleeding.  - Normal gastric body, antrum, cardia and gastric fundus. Biopsied.  - A small amount of retained fibrous/vegetable material was present in the stomach. No documented history of gastroparesis.  - Normal duodenal bulb and second portion of the duodenum. Biopsied.  - Tortuosity of esophagus and/or esophagitis could be contributing to patient's reported dysphagia. An esophageal motility disorder remains in the differential diagnosis.  Colonoscopy - Decreased sphincter tone found on digital rectal exam.  - The colonic mucosa appeared diffusely atrophic and scarred but without significantly active inflammation endoscopically. Biopsied. Rule out active IBD, microscopic colitis and dysplasia.  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 Biopsied -rule out dysplasia. - The examined portion of the ileum was normal. Biopsied. - One 5 mm polyp in the rectum, removed with a cold biopsy forceps. Resected and retrieved.  - The distal rectum and anal verge are normal on retroflexion view  Pathology negative for H. pylori and celiac disease Right colon biopsies showed chronic active colitis; chronic inactive colitis on left colon biopsies Polyps were hyperplastic polyps  Previous modalities employed have included: -- Treatment of Crohn's disease with mesalamine  and budesonide  (ineffective), Skyrizi  (symptoms of diarrhea but had concomitant EPEC infection) -- Antibiotics-metronidazole  and Augmentin  presumably for SIBO -- Colestid  --pills were too big to swallow -- Pancreatic enzymes -no benefit -- Uses Lomotil  and Imodium  -- Amitriptyline  and nortriptyline -ineffective -- Viberzi  - partial benefit  Current GI Meds  Lomotil  as needed Viberzi  75 mg p.o. twice daily Prilosec 40 mg orally daily  Interval History   Crohn's disease and  Chronic Diarrhea -  [name] began Skyrizi  09/2023 and received 2 induction infusions - Noted worsening diarrhea after first infusion that slowly improved and recurred after second infusion - Shortly after the second infusion she was hospitalized with EPEC and AKI with creatinine 7.9, significant electrolyte abnormalities - Treated inpatient with 5 days of IV Rocephin   - Bowel movements increased in frequency after E. coli infection and Skyrizi  treatment, not returned to baseline - Trial of Viberzi  75 mg p.o. twice daily with samples provided was helpful decreasing stool frequency and consistency - Insurance would not cover Viberzi  until patient had trialed alternate medications -pleated trials of amitriptyline  and nortriptyline  without benefit - Has now resumed Viberzi  75 mg po BID but it is cost prohibitive -bleeding assistance form in clinic today  - Currently having at least 5, loose, liquid bowel movements daily - Associated with urgency and prior daily accidents; fewer recent accidents - Uses diapers for incontinence - Lomotil  two tablets three times daily; sedation has improved - Potentially interested in repeat trial of antibiotics for SIBO -prescribed azithromycin  at the time of last appointment  - Currently off Crohn's disease therapy including budesonide  - Discussed deferring further Crohn's disease treatment at this time given worsening diarrhea after starting Skyrizi  -unclear contribution of medication effect versus EPEC  Abdominal Pain, Nausea, and Vomiting - Abdominal pain typically rare but with recent severe episodes (Sunday night and today), relieved by dicyclomine  - Recent clustering of pain episodes - Nausea and vomiting occur only with severe pain; last vomiting episode 1-2 weeks ago, often after eating out - Hiccups, heartburn, and sensation of food getting stuck precede vomiting - Throat and esophagus described as rough; chews thoroughly, eats slowly, sometimes feels food  if pieces are not small - Continues on Prilosec 20 mg orally daily  Iron  Deficiency Anemia and Supplementation - Continues omeprazole  and daily Vitron C - Dislikes taste of iron  but does not associate it with abdominal discomfort  Weight Changes and Falls: - Weight gain despite low intake and inactivity -current weight 123 pounds - Two recent falls while decorating, attributed to tripping over objects - Denies lightheadedness or dizziness related to medication effect - Near-resolution of groin strain following falls - Caregiver present during falls   GI Review of Symptoms Significant for abdominal pain, diarrhea, gas, otherwise negative.  General Review of Systems  Review of systems is significant for the pertinent positives and negatives as listed per the HPI.  Full ROS is otherwise negative.  Inflammatory Bowel Disease History  - Crohn's colitis diagnosed at [facility] in 1997 - Treated with moderate to high dose mesalamine  Lialda  2.4-4.8 g daily - Reported using amoxicillin  periodically for Crohn's flares with benefit in the past but subsequent loss of efficacy - No change in symptoms with trials of budesonide  - Trial of Skyrizi  09/2023 after colonoscopy showed evolving stenosis without active inflammation in the ascending colon -developed diarrhea but also concomitant EPEC infection (bowel changes not necessarily drug-related); ceased medication at patient request  IBD Medication History Budesonide -lack of efficacy Mesalamine  agents -lack of efficacy Skyrizi  09/2023 -only received 2 induction doses as she developed worsening diarrhea was truly a drug side effec  Past Medical History   Past Medical History:  Diagnosis Date   Allergies    Anemia    Bruises easily    Complication of anesthesia    Crohn's disease (HCC)    GI Dr Miquel in [place], [place]     Deviated septum    GERD (gastroesophageal reflux disease)  History of hiatal hernia    HTN  (hypertension)    pcp Dr Evalene Polo  in [place], [place]     Kidney disease, chronic, stage III (GFR 30-59 ml/min) (HCC)    Neuropathy    fingers,periodically   Osteoarthritis    Pancreatic insufficiency    PONV (postoperative nausea and vomiting)    Scoliosis    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   Whooping cough 2015   Whooping cough with pneumonia    2017     Past Surgical History   Past Surgical History:  Procedure Laterality Date   BIOPSY  11/05/2022   Procedure: BIOPSY;  Surgeon: Albertus Gordy HERO, MD;  Location: [facility] ENDOSCOPY;  Service: Gastroenterology;;   CATARACT EXTRACTION, BILATERAL  2018   with IOC implant    COLONOSCOPY     DEBRIDEMENT AND CLOSURE WOUND Right 10/01/2018   Procedure: Excision of right knee wound with placement of ACell and Pravena;  Surgeon: Lowery Estefana RAMAN, DO;  Location: [facility];  Service: Plastics;  Laterality: Right;  30 min   ESOPHAGOGASTRODUODENOSCOPY (EGD) WITH PROPOFOL  N/A 11/05/2022   Procedure: ESOPHAGOGASTRODUODENOSCOPY (EGD) WITH PROPOFOL ;  Surgeon: Albertus Gordy HERO, MD;  Location: [facility] ENDOSCOPY;  Service: Gastroenterology;  Laterality: N/A;   HERNIA REPAIR     knee fracture  Right    NASAL FRACTURE SURGERY     needed b/c she was getting frequent sinus infections    TONSILLECTOMY     TOTAL KNEE ARTHROPLASTY Right 07/28/2018   Procedure: TOTAL KNEE ARTHROPLASTY;  Surgeon: Ernie Cough, MD;  Location: [facility] ORS;  Service: Orthopedics;  Laterality: Right;   TOTAL KNEE ARTHROPLASTY Left 03/02/2019   Procedure: TOTAL KNEE ARTHROPLASTY;  Surgeon: Ernie Cough, MD;  Location: [facility];  Service: Orthopedics;  Laterality: Left;  70 mins   UPPER GASTROINTESTINAL ENDOSCOPY     XI ROBOTIC ASSISTED HIATAL HERNIA REPAIR N/A 01/31/2023   Procedure: XI ROBOTIC ASSISTED HIATAL HERNIA REPAIR WITH MESH;  Surgeon: Lyndel Deward PARAS, MD;  Location: [facility];  Service: General;  Laterality: N/A;     Allergies and  Medications   Allergies  Allergen Reactions   Aspirin Other (See Comments)    Intestinal Bleeding   Latex Other (See Comments)    Redness and skin peeling   Other Itching    Nuts-itching in throat  Seeds-stomach issues with chrons  Pt has emphysema    Skyrizi  [Risankizumab ]     Excessive Diarrhea   Sulfa Antibiotics Nausea And Vomiting, Nausea Only, Other (See Comments) and Swelling   Adhesive [Tape] Other (See Comments)    Redness and skin peeling   Ciprofloxacin Nausea And Vomiting   Codeine Nausea And Vomiting   Demerol [Meperidine Hcl] Nausea And Vomiting   Fentanyl  Nausea And Vomiting    Makes me very sick   Lactose Intolerance (Gi) Other (See Comments)    Pt has Crohn's    Prednisone  Other (See Comments)    Upset stomach   Septra [Sulfamethoxazole-Trimethoprim] Nausea Only   Current Outpatient Medications  Medication Instructions   azelastine  (ASTELIN ) 0.1 % nasal spray 2 sprays, Each Nare, 2 times daily, Use in each nostril as directed   azithromycin  (ZITHROMAX ) 500 mg, Oral, Daily   Calcium  Citrate-Vitamin D  (CALCIUM  + D PO) 1 tablet, 2 times daily   cetirizine  (ZYRTEC ) 10 mg, Oral, Daily   dicyclomine  (BENTYL ) 20 mg, Oral, 4 times daily PRN   diphenoxylate -atropine  (LOMOTIL ) 2.5-0.025 MG tablet 2 tablets, Oral,  3 times daily, TAKE 2 TABLETS BY MOUTH IN THE MORNING ,AT NOON, AND AT BEDTIME AS DIRECTED   DULoxetine  (CYMBALTA ) 60 mg, Oral, 2 times daily   fluconazole  (DIFLUCAN ) 150 mg, Oral, Daily   fluticasone  (FLONASE ) 50 MCG/ACT nasal spray SPRAY 2 SPRAYS INTO EACH NOSTRIL EVERY DAY   Iron  (Ferrous Sulfate ) 325 mg, Oral, Daily at bedtime   Multiple Vitamin (MULTIVITAMIN WITH MINERALS) TABS tablet 1 tablet, Daily   nortriptyline  (PAMELOR ) 10 mg, Oral, Daily at bedtime   omeprazole  (PRILOSEC) 40 mg, Oral, Daily   ondansetron  (ZOFRAN ) 4 mg, Oral, Every 8 hours PRN   Viberzi  75 mg, Oral, 2 times daily     Family History   Family History  Problem Relation Age of  Onset   Diabetes Father    Parkinson's disease Father    Colon cancer Other    Asthma Neg Hx    Esophageal cancer Neg Hx    Pancreatic cancer Neg Hx    Liver disease Neg Hx    Stomach cancer Neg Hx     Social History   Social History   Tobacco Use   Smoking status: Former    Current packs/day: 0.00    Average packs/day: 1 pack/day for 30.0 years (30.0 ttl pk-yrs)    Types: Cigarettes    Start date: 49    Quit date: 1990    Years since quitting: 36.0   Smokeless tobacco: Never  Vaping Use   Vaping status: Never Used  Substance Use Topics   Alcohol use: Not Currently   Drug use: Never   [name] reports that she quit smoking about 36 years ago. Her smoking use included cigarettes. She started smoking about 66 years ago. She has a 30 pack-year smoking history. She has never used smokeless tobacco. She reports that she does not currently use alcohol. She reports that she does not use drugs.  Vital Signs and Physical Examination   Vitals:   01/20/24 0940  BP: (!) 172/70  Pulse: 60     Body mass index is 24.02 kg/m. Weight: 123 lb (55.8 kg)  General: Petite elderly female in no acute distress Head: Normocephalic and atraumatic Eyes: Sclerae anicteric, EOMI Lungs: Clear throughout to auscultation Heart: Regular rate and rhythm; No murmurs, rubs or bruits Abdomen: Soft, nontender, non distended. No masses, hepatosplenomegaly or hernias noted. Normal Bowel sounds Rectal: Deferred Musculoskeletal: Symmetrical with no gross deformities   Review of Data   The following data was reviewed at the time of this encounter:   Laboratory Studies      Latest Ref Rng & Units 01/20/2024   10:36 AM 11/18/2023   12:11 PM 11/12/2023    2:43 AM  CBC  WBC 4.0 - 10.5 K/uL 7.4  8.2  15.8   Hemoglobin 12.0 - 15.0 g/dL 88.9  9.8  89.3   Hematocrit 36.0 - 46.0 % 31.9  29.4  32.7   Platelets 150.0 - 400.0 K/uL 345.0  445.0  392     Lab Results  Component Value Date   LIPASE 58  (H) 11/04/2023      Latest Ref Rng & Units 01/20/2024   10:36 AM 11/18/2023   12:11 PM 11/12/2023    2:43 AM  CMP  Glucose 70 - 99 mg/dL 85  894  892   BUN 6 - 23 mg/dL 31  28  15    Creatinine 0.40 - 1.20 mg/dL 8.42  8.07  7.92   Sodium 135 - 145 mEq/L 136  136  133   Potassium 3.5 - 5.1 mEq/L 5.3 No hemolysis seen  5.3 No hemolysis seen  3.1   Chloride 96 - 112 mEq/L 104  106  81   CO2 19 - 32 mEq/L 24  22  33   Calcium  8.4 - 10.5 mg/dL 9.1  9.3  8.8   Total Protein 6.0 - 8.3 g/dL 7.6  7.0    Total Bilirubin 0.2 - 1.2 mg/dL 0.3  0.3    Alkaline Phos 39 - 117 U/L 134  64    AST 5 - 37 U/L 15  16    ALT 3 - 35 U/L 10  21     Lab Results  Component Value Date   CRP 0.6 (L) 01/20/2024   Lab Results  Component Value Date   TSH 0.95 11/18/2023   Labs 06/2023 -- Celiac panel negative -- Alpha gal panel negative -- Fecal fat and pancreatic elastase normal -- Fecal calprotectin 266 -- SIBO breath test + 08/2023 -- 7 alpha C4 laboratory t normal   IBD Labs  Prebiologic Labs 04/2023 Hepatitis B surface antigen negative Hepatitis B surface antibody negative Hepatitis B core antibody negative QuantiFERON gold negative  Therapeutic Drug Monitoring  Thiopurine metabolite levels:  Date:                6-TGN       6-MMP  Biologic level and antibodies:  Fecal Calprotectin    Imaging Studies  CTAP 10/27/2023 1. Active colitis involving the transverse and descending colon with mild circumferential wall thickening, pericolonic inflammatory stranding, and vascular engorgement; compatible with Crohns disease history. No obstruction or perforation. No free intraperitoneal gas or fluid. 2. Marked thoracolumbar sigmoid colpocele 3. Extensive right coronary artery calcification.  MRE 05/12/2023 1. Appearance of the colon suggests partial right hemicolectomy and ileocolic anastomosis. 2. Interval resolution of distended, fecalized loops of neoterminal ileum seen on prior  examination. No specifically visible evidence of stricture or obstruction on today's examination. 3. The remnant distal ascending and transverse colon remains diffusely featureless, thickened, and tethered, however without substantial mucosal hyperenhancement or acute inflammatory findings on today's examination. 4. The descending colon is relatively normal in appearance, and the sigmoid colon and rectum are decompressed although somewhat featureless and hyperenhancing. 5. Findings are consistent with chronic and postoperative stigmata of Crohn's disease with some evidence of active inflammation involving the distal colon. No evidence of complicating stricture, obstruction, fistula, or abscess on today's examination.  CTAP 04/17/2023 1. Terminal ileitis with dilated terminal ileum filled with fecalized material suggestive of developing obstruction. 2. Associated irregular bowel wall thickening of the proximal transverse colon in the setting of a likely partial right colectomy. Recommend colonoscopy status post treatment and status post complete resolution of inflammatory changes to exclude an underlying lesion. 3. Gastric lumen filled with multiple oval hyperdensities. Correlate with ingestion of medicine or candies. 4. Aortic Atherosclerosis (ICD10-I70.0) -including four-vessel coronary calcification.  UGI 03/06/2023 Extensive tertiary contractions are noted in middle and distal portions of the esophagus suggesting presbyesophagus.   Moderate to severe persistent narrowing of gastroesophageal junction is noted; potentially this may be due to postoperative edema given recent surgery. 13 mm barium tablet was delayed at this level and not visualized to pass into the stomach.   There was very slow transit of contrast from stomach into duodenum. This may simply be due to motility disorder. Visualized portion of duodenum is unremarkable.   Mild gastric wall thickening is noted in body  of  stomach which may be due to recent surgery or may represent some degree of gastritis.  CTAP 11/04/2022 1. Wall thickening and mild mucosal hyperenhancement about the right and transverse colon compatible with colitis. This has slightly progressed compared to CT 09/24/2022. 2. Large hiatal hernia with intrathoracic stomach. 3. Fecalized ileum compatible with slow transit. No dilation to suggest obstruction.  MRI/MRCP 10/18/2022 1. Limited exam. 2. Small/atrophic pancreas with slightly prominent main pancreatic duct measuring up to 3-4 mm. 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 Recommend follow up pre and post contrast MRI/MRCP in 2 years. 3. Partially exophytic 2.4 cm proteinaceous/hemorrhagic cyst arising from the left kidney lower pole. 4. Multiple other nonacute observations, as described above.  CTAP 09/24/2022 1. Short segment mural thickening of the hepatic flexure, which may be due to underdistention or colitis. 2. Trace free fluid adjacent to the pancreatic tail in the left upper quadrant, which is nonspecific but may be seen in the setting of acute pancreatitis. 3. Large hiatal hernia with nearly the entire stomach within the left chest. 4. Aortic Atherosclerosis (ICD10-I70.0) and Emphysema (ICD10-J43.9). Coronary artery calcifications. Assessment for potential risk factor modification, dietary therapy or pharmacologic therapy may be warranted, if clinically indicated.   GI Procedures and Studies  EGD/Colonoscopy 07/29/2023 EGD - Tortuous esophagus. Dilation performed in the entire esophagus. No evidence of residual paraesophageal hernia.  - LA Grade A esophagitis with no bleeding.  - Normal gastric body, antrum, cardia and gastric fundus.  Biopsied.  - A small amount of retained fibrous/vegetable material was present in the stomach. No documented history of gastroparesis.  - Normal duodenal bulb and second portion of the duodenum. Biopsied.  - Tortuosity of esophagus and/or esophagitis could be contributing to patient's reported dysphagia. An esophageal motility disorder remains in the differential diagnosis.  Colonoscopy - Decreased sphincter tone found on digital rectal exam.  - The colonic mucosa appeared diffusely atrophic and scarred but without significantly active inflammation endoscopically. Biopsied. Rule out active IBD, microscopic colitis and dysplasia.  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 Biopsied -rule out dysplasia. - The examined portion of the ileum was normal. Biopsied. - One 5 mm polyp in the rectum, removed with a cold biopsy forceps. Resected and retrieved.  - The distal rectum and anal verge are normal on retroflexion view  Path:   1. Surgical [P], duodenal :      - SMALL BOWEL MUCOSA WITH NO SPECIFIC PATHOLOGIC CHANGE.      - NEGATIVE FOR INTRAEPITHELIAL LYMPHOCYTOSIS OR VILLOUS BLUNTING.       2. Surgical [P], gastric :      - BENIGN GASTRIC MUCOSA WITH CHANGES SUGGESTIVE OF PPI EFFECT.      - NO EVIDENCE OF H. PYLORI ON H&E STAIN.       3. Surgical [P], small bowel, terminal ileum :      - SMALL BOWEL MUCOSA WITH NO SPECIFIC PATHOLOGIC CHANGE.       4. Surgical [P], colon, ascending :      - CHRONIC COLITIS WITH FOCAL ACTIVITY.      - NEGATIVE FOR DYSPLASIA.       5. Surgical [P], ascending colon stenosis :      - CHRONIC ACTIVE COLITIS WITH MILD ACTIVITY.      - NEGATIVE FOR DYSPLASIA.  6. Surgical [P], colon, transverse :      - CHRONIC ACTIVE COLITIS WITH MILD ACTIVITY.      - NEGATIVE FOR DYSPLASIA.       7. Surgical [P], colon, descending :      - CHRONIC INACTIVE COLITIS.      - NEGATIVE FOR DYSPLASIA.       8. Surgical [P], colon, rectosigmoid :      - CHRONIC INACTIVE  COLITIS.      - HYPERPLASTIC POLYP.      - NEGATIVE FOR DYSPLASIA.       9. Surgical [P], colon, rectum, polyp (1) :      - HYPERPLASTIC POLYP.  EGD 11/05/2022 - Tortuous esophagus - Large HH - > 50% of stomach is intrathoracic - Congested and erythematous mucosa in the gastric fundus and body, most likely secondary to [facility] - Normal duodenum  Path: A. STOMACH, BIOPSY:  Gastric oxyntic mucosa with focal hyperemia.  Negative for Helicobacter pylori.   Colonoscopy 11/23/2020  - Normal ileum - Changes of chronic colitis with trivial patchy inflammation status post biopsies  Path: 1. Surgical [P], right colon biopsy - MILDLY ACTIVE CHRONIC COLITIS, CONSISTENT WITH PATIENT'S CLINICAL HISTORY OF CROHN'S DISEASE - NEGATIVE FOR GRANULOMAS OR DYSPLASIA 2. Surgical [P], transverse colon biopsy - MILDLY ACTIVE CHRONIC COLITIS, CONSISTENT WITH PATIENT'S CLINICAL HISTORY OF CROHN'S DISEASE - NEGATIVE FOR GRANULOMAS OR DYSPLASIA 3. Surgical [P], left colon biopsy - INACTIVE CHRONIC COLITIS, CONSISTENT WITH PATIENT'S CLINICAL HISTORY OF CROHN'S DISEASE - NEGATIVE FOR GRANULOMAS OR DYSPLASIA  Colonoscopy 06/05/2017 The ileum was normal. Chronic colitis noted in the left colon. Diminutive pseudopolyp in the cecum. Mild internal hemorrhoids. Biopsies of the cecal polyp were a benign lymphoid aggregate. Biopsies in the right colon were normal. Biopsies in the left colon revealed inactive chronic colitis. The patient was told to take Lialda  4.8 g daily for 8 weeks then 2.4 g daily thereafter.   Clinical Impression  It is my clinical impression that [name] is a 79 y.o. 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 coli infection (EPEC) 10/2023  [name] has baseline  gastrointestinal symptoms of chronic diarrhea, abdominal pain, gas, nausea, fecal urgency which she feels are overall negatively impacting her quality of life.  We previously discussed that she has multiple concomitant medical conditions that could be contributing to symptoms including Crohn's colitis, SIBO and IBS-D. Additional laboratory testing 2025 ruled out celiac disease, H. pylori infection, pancreatic exocrine insufficiency, alpha gal syndrome, bile acid malabsorption  [name]'s colonoscopy 07/2023 showed evidence of a stenosis in the proximal ascending colon likely a sequelae of her Crohn's disease.  Biopsies were negative for dysplasia but did show active inflammation.  In light of her stenosing Crohn's phenotype, we discussed a trial of Skyrizi  given its efficacy and overall favorable safety profile.  She received 2 doses in 09/2023 and 10/2023.  She reports developing significant diarrhea after her first 2 induction infusions.  Her course in October 2025 was complicated by EPEC infection resulting in diarrhea, dehydration, severe AKI (Cr 7.9) for which she was hospitalized.  She was managed with IV antibiotics and fortunately her kidney function returned to baseline without need for dialysis solely with hydration.  [name] reports that her after her hospitalization her diarrhea is worse than her chronic baseline.  I provided  her with a 7-day course of azithromycin  shortly after her hospitalization in the event there was lingering E. coli.  He also started a trial of Viberzi  75 mg p.o. twice daily from samples available in the office.  This was helpful in reducing her bowel frequency and creating consistency to her stool.  Unfortunately it was not approved by insurance which mandated that she fail 2 other agents before considering approval.  She did not respond to trials of amitriptyline  or nortriptyline .  She has since resumed Viberzi  but is not noting as robust response as previous.  Reviewed that she  may need more time on the medication.  Discussed possibility of dose escalation to 100 mg p.o. twice daily if her renal function can tolerated.  Also discussed the possibility of treating SIBO again.  We have opted to defer Crohn's treatment at this time given the abrupt worsening of diarrhea she previously experienced-unclear if this was due to EPEC versus component of Skyrizi .  At today's visit, she reports 2 recent falls after tripping over holiday decorations.  Denies that she is experiencing lightheaded or dizziness related to her medications.  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  She has had intermittent symptoms of dysphagia.  Recent EGD did not show recurrence of her paraesophageal hernia.  Empiric Savary dilation was performed to 16 mm with acceptable results.  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  Last imaging performed 10/2022 recommended follow-up imaging in 2 years-2026.  Plan  Labs today: CBC, CMP, ESR, CRP, iron  panel, vitamin B12, folate, vitamin D  Continue Viberzi  75 mg po BID -cost assistance form completed today Continue Lomotil  as needed-cautioned to dose reduce if she develops lightheadedness, dizziness, weakness Previous refill provided for azithromycin  500 mg p.o. daily for possible SIBO treatment -if symptoms are not improving with Viberzi  and total we can consider this If the possibility of resuming Crohn's therapy is entertained in the future may consider Omvoh for safety and tolerance in older individuals Continue omeprazole  40 mg orally daily Continue low fiber diet given stenosing Crohn's disease Previously advised iron  supplementation Monitor weight and anthropometrics Repeat MRI/MRCP for follow-up of IPMN 10/2024   IBD Health Maintenance  Vaccinations Influenza: PCV 20: PCV 21: COVID19: HAV: HBV:  Shingles: HPV: Tdap:  DEXA Due - ordered 01/2024  Pap Smear   Eye Exam  PRN  Skin Exam PRN  Surveillance Colonoscopy 07/2023 - no dysplasia; cease further surveillance due to age  Tobacco Use None  Depression Screen     Planned Follow Up 3-6 months  The patient or caregiver verbalized understanding of the material covered, with no barriers to understanding. All questions were answered. Patient or caregiver is agreeable with the plan outlined above.    It was a pleasure to see [name].  If you have any questions or concerns regarding this evaluation, do not hesitate to contact me.  Inocente Hausen, MD [facility]    I spent total of 50 minutes in both face-to-face (30 minutes interview) and non-face-to-face (20 minutes chart review, care coordination, documentation)  activities, excluding procedures performed, for the visit on the date of this encounter.   "

## 2024-01-20 ENCOUNTER — Other Ambulatory Visit

## 2024-01-20 ENCOUNTER — Ambulatory Visit: Admitting: Pediatrics

## 2024-01-20 ENCOUNTER — Encounter: Payer: Self-pay | Admitting: Pediatrics

## 2024-01-20 VITALS — BP 172/70 | HR 60 | Ht 60.0 in | Wt 123.0 lb

## 2024-01-20 DIAGNOSIS — K219 Gastro-esophageal reflux disease without esophagitis: Secondary | ICD-10-CM | POA: Diagnosis not present

## 2024-01-20 DIAGNOSIS — K862 Cyst of pancreas: Secondary | ICD-10-CM

## 2024-01-20 DIAGNOSIS — K50918 Crohn's disease, unspecified, with other complication: Secondary | ICD-10-CM

## 2024-01-20 DIAGNOSIS — E875 Hyperkalemia: Secondary | ICD-10-CM

## 2024-01-20 DIAGNOSIS — D649 Anemia, unspecified: Secondary | ICD-10-CM

## 2024-01-20 DIAGNOSIS — Z7952 Long term (current) use of systemic steroids: Secondary | ICD-10-CM

## 2024-01-20 DIAGNOSIS — E876 Hypokalemia: Secondary | ICD-10-CM

## 2024-01-20 DIAGNOSIS — A498 Other bacterial infections of unspecified site: Secondary | ICD-10-CM

## 2024-01-20 DIAGNOSIS — K580 Irritable bowel syndrome with diarrhea: Secondary | ICD-10-CM

## 2024-01-20 DIAGNOSIS — K638219 Small intestinal bacterial overgrowth, unspecified: Secondary | ICD-10-CM

## 2024-01-20 DIAGNOSIS — K50118 Crohn's disease of large intestine with other complication: Secondary | ICD-10-CM

## 2024-01-20 DIAGNOSIS — D490 Neoplasm of unspecified behavior of digestive system: Secondary | ICD-10-CM | POA: Diagnosis not present

## 2024-01-20 DIAGNOSIS — K529 Noninfective gastroenteritis and colitis, unspecified: Secondary | ICD-10-CM | POA: Diagnosis not present

## 2024-01-20 DIAGNOSIS — D509 Iron deficiency anemia, unspecified: Secondary | ICD-10-CM | POA: Diagnosis not present

## 2024-01-20 DIAGNOSIS — K50119 Crohn's disease of large intestine with unspecified complications: Secondary | ICD-10-CM

## 2024-01-20 LAB — COMPREHENSIVE METABOLIC PANEL WITH GFR
ALT: 10 U/L (ref 3–35)
AST: 15 U/L (ref 5–37)
Albumin: 3.9 g/dL (ref 3.5–5.2)
Alkaline Phosphatase: 134 U/L — ABNORMAL HIGH (ref 39–117)
BUN: 31 mg/dL — ABNORMAL HIGH (ref 6–23)
CO2: 24 meq/L (ref 19–32)
Calcium: 9.1 mg/dL (ref 8.4–10.5)
Chloride: 104 meq/L (ref 96–112)
Creatinine, Ser: 1.57 mg/dL — ABNORMAL HIGH (ref 0.40–1.20)
GFR: 31.39 mL/min — ABNORMAL LOW
Glucose, Bld: 85 mg/dL (ref 70–99)
Potassium: 5.3 meq/L — ABNORMAL HIGH (ref 3.5–5.1)
Sodium: 136 meq/L (ref 135–145)
Total Bilirubin: 0.3 mg/dL (ref 0.2–1.2)
Total Protein: 7.6 g/dL (ref 6.0–8.3)

## 2024-01-20 LAB — CBC WITH DIFFERENTIAL/PLATELET
Basophils Absolute: 0.1 K/uL (ref 0.0–0.1)
Basophils Relative: 1.3 % (ref 0.0–3.0)
Eosinophils Absolute: 0.4 K/uL (ref 0.0–0.7)
Eosinophils Relative: 5.3 % — ABNORMAL HIGH (ref 0.0–5.0)
HCT: 31.9 % — ABNORMAL LOW (ref 36.0–46.0)
Hemoglobin: 11 g/dL — ABNORMAL LOW (ref 12.0–15.0)
Lymphocytes Relative: 16.6 % (ref 12.0–46.0)
Lymphs Abs: 1.2 K/uL (ref 0.7–4.0)
MCHC: 34.4 g/dL (ref 30.0–36.0)
MCV: 91.2 fl (ref 78.0–100.0)
Monocytes Absolute: 0.5 K/uL (ref 0.1–1.0)
Monocytes Relative: 6.2 % (ref 3.0–12.0)
Neutro Abs: 5.2 K/uL (ref 1.4–7.7)
Neutrophils Relative %: 70.6 % (ref 43.0–77.0)
Platelets: 345 K/uL (ref 150.0–400.0)
RBC: 3.5 Mil/uL — ABNORMAL LOW (ref 3.87–5.11)
RDW: 16.4 % — ABNORMAL HIGH (ref 11.5–15.5)
WBC: 7.4 K/uL (ref 4.0–10.5)

## 2024-01-20 LAB — IBC + FERRITIN
Ferritin: 78.8 ng/mL (ref 10.0–291.0)
Iron: 86 ug/dL (ref 42–145)
Saturation Ratios: 29.8 % (ref 20.0–50.0)
TIBC: 288.4 ug/dL (ref 250.0–450.0)
Transferrin: 206 mg/dL — ABNORMAL LOW (ref 212.0–360.0)

## 2024-01-20 LAB — B12 AND FOLATE PANEL
Folate: >23.4 ng/mL
Vitamin B-12: 798 pg/mL (ref 211–911)

## 2024-01-20 LAB — SEDIMENTATION RATE: Sed Rate: 31 mm/h — ABNORMAL HIGH (ref 0–30)

## 2024-01-20 LAB — VITAMIN D 25 HYDROXY (VIT D DEFICIENCY, FRACTURES): VITD: 57.36 ng/mL (ref 30.00–100.00)

## 2024-01-20 LAB — C-REACTIVE PROTEIN: CRP: 0.6 mg/dL — ABNORMAL LOW (ref 1.0–20.0)

## 2024-01-20 NOTE — Patient Instructions (Signed)
 Your provider has requested that you go to the basement level for lab work before leaving today. Press B on the elevator. The lab is located at the first door on the left as you exit the elevator.  Due to recent changes in healthcare laws, you may see the results of your imaging and laboratory studies on MyChart before your provider has had a chance to review them.  We understand that in some cases there may be results that are confusing or concerning to you. Not all laboratory results come back in the same time frame and the provider may be waiting for multiple results in order to interpret others.  Please give us  48 hours in order for your provider to thoroughly review all the results before contacting the office for clarification of your results.   Follow up in 3-6 months.  _______________________________________________________  If your blood pressure at your visit was 140/90 or greater, please contact your primary care physician to follow up on this.  _______________________________________________________  If you are age 56 or older, your body mass index should be between 23-30. Your Body mass index is 24.02 kg/m. If this is out of the aforementioned range listed, please consider follow up with your Primary Care Provider.  If you are age 56 or younger, your body mass index should be between 19-25. Your Body mass index is 24.02 kg/m. If this is out of the aformentioned range listed, please consider follow up with your Primary Care Provider.   ________________________________________________________  The [facility] providers would like to encourage you to use MYCHART to communicate with providers for non-urgent requests or questions.  Due to long hold times on the telephone, sending your provider a message by [facility] may be a faster and more efficient way to get a response.  Please allow 48 business hours for a response.  Please remember that this is for non-urgent requests.   _______________________________________________________  [facility] is using a team-based approach to care.  Your team is made up of your doctor and two to three APPS. Our APPS (Nurse Practitioners and Physician Assistants) work with your physician to ensure care continuity for you. They are fully qualified to address your health concerns and develop a treatment plan. They communicate directly with your gastroenterologist to care for you. Seeing the Advanced Practice Practitioners on your physician's team can help you by facilitating care more promptly, often allowing for earlier appointments, access to diagnostic testing, procedures, and other specialty referrals.

## 2024-01-20 NOTE — Telephone Encounter (Signed)
 [facility] patient assist form completed and faxed.

## 2024-01-21 ENCOUNTER — Other Ambulatory Visit: Payer: Self-pay

## 2024-01-21 ENCOUNTER — Ambulatory Visit: Payer: Self-pay | Admitting: Pediatrics

## 2024-01-21 DIAGNOSIS — K50119 Crohn's disease of large intestine with unspecified complications: Secondary | ICD-10-CM

## 2024-01-21 DIAGNOSIS — Z7952 Long term (current) use of systemic steroids: Secondary | ICD-10-CM

## 2024-02-04 ENCOUNTER — Other Ambulatory Visit: Payer: Self-pay

## 2024-02-04 ENCOUNTER — Other Ambulatory Visit: Payer: Self-pay | Admitting: Pediatrics

## 2024-02-04 ENCOUNTER — Other Ambulatory Visit (HOSPITAL_BASED_OUTPATIENT_CLINIC_OR_DEPARTMENT_OTHER): Payer: Self-pay

## 2024-02-04 ENCOUNTER — Telehealth: Payer: Self-pay | Admitting: Pediatrics

## 2024-02-04 MED ORDER — DIPHENOXYLATE-ATROPINE 2.5-0.025 MG PO TABS: ORAL_TABLET | ORAL | 5 refills | Status: AC

## 2024-02-04 MED FILL — Diphenoxylate w/ Atropine Tab 2.5-0.025 MG: ORAL | 30 days supply | Qty: 180 | Fill #0 | Status: CN

## 2024-02-04 NOTE — Telephone Encounter (Signed)
 New Rx faxed to [facility] on [address] as the patient requested.

## 2024-02-04 NOTE — Telephone Encounter (Signed)
 Inbound call from patient requesting a refill on her Lomotil . Patient is requesting for it to go to [facility] on [address]. Please advise.

## 2024-02-04 NOTE — Telephone Encounter (Signed)
 I spoke to pharmacist at [facility] and canceled the Rx for Lomotil .

## 2024-02-05 ENCOUNTER — Other Ambulatory Visit (HOSPITAL_COMMUNITY): Payer: Self-pay

## 2024-03-15 ENCOUNTER — Ambulatory Visit: Admitting: Pediatrics

## 2024-04-06 ENCOUNTER — Ambulatory Visit (INDEPENDENT_AMBULATORY_CARE_PROVIDER_SITE_OTHER): Admitting: Otolaryngology
